# Patient Record
Sex: Female | Born: 1940
Health system: Southern US, Community
[De-identification: ages and names within clinical notes are randomized; demographics above are authoritative.]

## PROBLEM LIST (undated history)

## (undated) DIAGNOSIS — I10 Essential (primary) hypertension: Secondary | ICD-10-CM

## (undated) DIAGNOSIS — J449 Chronic obstructive pulmonary disease, unspecified: Secondary | ICD-10-CM

## (undated) DIAGNOSIS — M329 Systemic lupus erythematosus, unspecified: Secondary | ICD-10-CM

## (undated) DIAGNOSIS — F32A Depression, unspecified: Secondary | ICD-10-CM

## (undated) DIAGNOSIS — M359 Systemic involvement of connective tissue, unspecified: Secondary | ICD-10-CM

## (undated) DIAGNOSIS — IMO0002 Reserved for concepts with insufficient information to code with codable children: Secondary | ICD-10-CM

## (undated) DIAGNOSIS — C801 Malignant (primary) neoplasm, unspecified: Secondary | ICD-10-CM

## (undated) DIAGNOSIS — K219 Gastro-esophageal reflux disease without esophagitis: Secondary | ICD-10-CM

## (undated) DIAGNOSIS — M5136 Other intervertebral disc degeneration, lumbar region: Secondary | ICD-10-CM

## (undated) DIAGNOSIS — K922 Gastrointestinal hemorrhage, unspecified: Secondary | ICD-10-CM

## (undated) DIAGNOSIS — M51369 Other intervertebral disc degeneration, lumbar region without mention of lumbar back pain or lower extremity pain: Secondary | ICD-10-CM

## (undated) DIAGNOSIS — M1990 Unspecified osteoarthritis, unspecified site: Secondary | ICD-10-CM

## (undated) DIAGNOSIS — R51 Headache: Secondary | ICD-10-CM

## (undated) DIAGNOSIS — K5190 Ulcerative colitis, unspecified, without complications: Secondary | ICD-10-CM

## (undated) DIAGNOSIS — F329 Major depressive disorder, single episode, unspecified: Secondary | ICD-10-CM

## (undated) DIAGNOSIS — K5792 Diverticulitis of intestine, part unspecified, without perforation or abscess without bleeding: Secondary | ICD-10-CM

## (undated) DIAGNOSIS — L0390 Cellulitis, unspecified: Secondary | ICD-10-CM

## (undated) HISTORY — PX: ABDOMINAL HYSTERECTOMY: SHX81

## (undated) HISTORY — PX: COLONOSCOPY: SHX174

## (undated) HISTORY — PX: BREAST BIOPSY: SHX20

---

## 2004-02-28 ENCOUNTER — Ambulatory Visit: Payer: Self-pay | Admitting: Internal Medicine

## 2004-09-15 ENCOUNTER — Ambulatory Visit (HOSPITAL_COMMUNITY): Admission: RE | Admit: 2004-09-15 | Discharge: 2004-09-15 | Payer: Self-pay | Admitting: Neurosurgery

## 2005-07-28 ENCOUNTER — Ambulatory Visit: Payer: Self-pay | Admitting: Internal Medicine

## 2005-10-08 ENCOUNTER — Ambulatory Visit: Payer: Self-pay | Admitting: Unknown Physician Specialty

## 2005-12-31 ENCOUNTER — Ambulatory Visit: Payer: Self-pay | Admitting: Unknown Physician Specialty

## 2006-10-18 ENCOUNTER — Ambulatory Visit: Payer: Self-pay | Admitting: Internal Medicine

## 2007-10-26 ENCOUNTER — Ambulatory Visit: Payer: Self-pay | Admitting: Internal Medicine

## 2008-10-23 ENCOUNTER — Ambulatory Visit: Payer: Self-pay | Admitting: Internal Medicine

## 2008-10-24 ENCOUNTER — Ambulatory Visit: Payer: Self-pay | Admitting: Internal Medicine

## 2008-10-24 ENCOUNTER — Ambulatory Visit: Payer: Self-pay

## 2009-01-14 ENCOUNTER — Ambulatory Visit: Payer: Self-pay | Admitting: Internal Medicine

## 2010-01-15 ENCOUNTER — Ambulatory Visit: Payer: Self-pay | Admitting: Internal Medicine

## 2010-02-11 ENCOUNTER — Ambulatory Visit: Payer: Self-pay | Admitting: Unknown Physician Specialty

## 2010-02-12 LAB — PATHOLOGY REPORT

## 2010-03-26 ENCOUNTER — Encounter
Admission: RE | Admit: 2010-03-26 | Discharge: 2010-03-26 | Payer: Self-pay | Source: Home / Self Care | Attending: Unknown Physician Specialty | Admitting: Unknown Physician Specialty

## 2011-01-05 ENCOUNTER — Inpatient Hospital Stay: Payer: Self-pay | Admitting: Internal Medicine

## 2011-03-29 ENCOUNTER — Ambulatory Visit: Payer: Self-pay | Admitting: Unknown Physician Specialty

## 2011-04-01 LAB — PATHOLOGY REPORT

## 2011-04-06 ENCOUNTER — Ambulatory Visit: Payer: Self-pay | Admitting: Internal Medicine

## 2011-06-15 ENCOUNTER — Ambulatory Visit: Payer: Self-pay | Admitting: Physical Medicine and Rehabilitation

## 2011-08-26 ENCOUNTER — Ambulatory Visit: Payer: Self-pay | Admitting: Family Medicine

## 2011-09-15 ENCOUNTER — Encounter (HOSPITAL_COMMUNITY): Payer: Self-pay | Admitting: Respiratory Therapy

## 2011-09-15 NOTE — H&P (Signed)
CC: left shoulder pain HPI: 71 y/o female sustained a ground level fall almost 1 month ago injuring left shoulder and proximal humerus. Pt c/o constant pain and has elected for surgical ORIF to decrease pain and increase function PMH: anemia, COPD, depression, diabetes, diverticulitis, GERD, hypertension, migraines, ulcerative colitis Allergies: NKDA Meds: norco, gabapentin, premarin, metformin, ultram, asacol, protonix ROS: pain with rom left shoulder otherwise negative Social: non smoker, non drinker, widowed and lives alone Family: copd, depression, diabetes PE: alert and appropriate 71 y/o female in no acute distress Cervical spine shows full rom  Cranial nerves 2-12 intact Left shoulder: moderate pain and decrease rom of left shoulder nv intact distally Strength moderately decreased X-rays: proximal humerus fracture with mild displacement Assessment: left proximal humerus fracture Plan: ORIF left humerus to decrease pain and increase function

## 2011-09-16 ENCOUNTER — Encounter (HOSPITAL_COMMUNITY): Payer: Self-pay

## 2011-09-16 MED ORDER — CHLORHEXIDINE GLUCONATE 4 % EX LIQD: 60.0000 mL | Freq: Once | CUTANEOUS | Status: DC

## 2011-09-16 MED ORDER — CEFAZOLIN SODIUM-DEXTROSE 2-3 GM-% IV SOLR
2.0000 g | INTRAVENOUS | Status: AC
  Administered 2011-09-17: 2 g via INTRAVENOUS
  Filled 2011-09-16: qty 50

## 2011-09-16 MED ORDER — SODIUM CHLORIDE 0.9 % IV SOLN: INTRAVENOUS | Status: DC

## 2011-09-17 ENCOUNTER — Encounter (HOSPITAL_COMMUNITY): Payer: Self-pay | Admitting: Anesthesiology

## 2011-09-17 ENCOUNTER — Encounter (HOSPITAL_COMMUNITY): Payer: Self-pay | Admitting: *Deleted

## 2011-09-17 ENCOUNTER — Ambulatory Visit (HOSPITAL_COMMUNITY): Payer: Worker's Compensation

## 2011-09-17 ENCOUNTER — Inpatient Hospital Stay (HOSPITAL_COMMUNITY): Payer: Worker's Compensation

## 2011-09-17 ENCOUNTER — Ambulatory Visit (HOSPITAL_COMMUNITY): Payer: Worker's Compensation | Admitting: Anesthesiology

## 2011-09-17 ENCOUNTER — Encounter (HOSPITAL_COMMUNITY)
Admission: RE | Disposition: A | Discharge status: Home / Self Care | Payer: Self-pay | Source: Ambulatory Visit | Attending: Orthopedic Surgery

## 2011-09-17 ENCOUNTER — Inpatient Hospital Stay (HOSPITAL_COMMUNITY)
Admission: RE | Admit: 2011-09-17 | Discharge: 2011-09-19 | DRG: 494 | Disposition: A | Discharge status: Home / Self Care | Payer: Worker's Compensation | Source: Ambulatory Visit | Attending: Orthopedic Surgery | Admitting: Orthopedic Surgery

## 2011-09-17 DIAGNOSIS — W19XXXA Unspecified fall, initial encounter: Secondary | ICD-10-CM | POA: Diagnosis present

## 2011-09-17 DIAGNOSIS — S42213A Unspecified displaced fracture of surgical neck of unspecified humerus, initial encounter for closed fracture: Principal | ICD-10-CM | POA: Diagnosis present

## 2011-09-17 DIAGNOSIS — Z836 Family history of other diseases of the respiratory system: Secondary | ICD-10-CM

## 2011-09-17 DIAGNOSIS — Z818 Family history of other mental and behavioral disorders: Secondary | ICD-10-CM

## 2011-09-17 DIAGNOSIS — F329 Major depressive disorder, single episode, unspecified: Secondary | ICD-10-CM | POA: Diagnosis present

## 2011-09-17 DIAGNOSIS — J4489 Other specified chronic obstructive pulmonary disease: Secondary | ICD-10-CM | POA: Diagnosis present

## 2011-09-17 DIAGNOSIS — J449 Chronic obstructive pulmonary disease, unspecified: Secondary | ICD-10-CM | POA: Diagnosis present

## 2011-09-17 DIAGNOSIS — E119 Type 2 diabetes mellitus without complications: Secondary | ICD-10-CM | POA: Diagnosis present

## 2011-09-17 DIAGNOSIS — K219 Gastro-esophageal reflux disease without esophagitis: Secondary | ICD-10-CM | POA: Diagnosis present

## 2011-09-17 DIAGNOSIS — I10 Essential (primary) hypertension: Secondary | ICD-10-CM | POA: Diagnosis present

## 2011-09-17 DIAGNOSIS — D649 Anemia, unspecified: Secondary | ICD-10-CM | POA: Diagnosis present

## 2011-09-17 DIAGNOSIS — S42202A Unspecified fracture of upper end of left humerus, initial encounter for closed fracture: Secondary | ICD-10-CM | POA: Diagnosis present

## 2011-09-17 DIAGNOSIS — F3289 Other specified depressive episodes: Secondary | ICD-10-CM | POA: Diagnosis present

## 2011-09-17 DIAGNOSIS — Z833 Family history of diabetes mellitus: Secondary | ICD-10-CM

## 2011-09-17 HISTORY — PX: ORIF HUMERUS FRACTURE: SHX2126

## 2011-09-17 HISTORY — DX: Major depressive disorder, single episode, unspecified: F32.9

## 2011-09-17 HISTORY — DX: Essential (primary) hypertension: I10

## 2011-09-17 HISTORY — DX: Ulcerative colitis, unspecified, without complications: K51.90

## 2011-09-17 HISTORY — DX: Depression, unspecified: F32.A

## 2011-09-17 HISTORY — DX: Headache: R51

## 2011-09-17 HISTORY — DX: Gastro-esophageal reflux disease without esophagitis: K21.9

## 2011-09-17 HISTORY — DX: Gastrointestinal hemorrhage, unspecified: K92.2

## 2011-09-17 HISTORY — DX: Diverticulitis of intestine, part unspecified, without perforation or abscess without bleeding: K57.92

## 2011-09-17 HISTORY — DX: Unspecified osteoarthritis, unspecified site: M19.90

## 2011-09-17 HISTORY — DX: Chronic obstructive pulmonary disease, unspecified: J44.9

## 2011-09-17 LAB — GLUCOSE, CAPILLARY: Glucose-Capillary: 136 mg/dL — ABNORMAL HIGH (ref 70–99)

## 2011-09-17 LAB — BASIC METABOLIC PANEL
BUN: 16 mg/dL (ref 6–23)
Calcium: 9.4 mg/dL (ref 8.4–10.5)
Chloride: 96 mEq/L (ref 96–112)
Creatinine, Ser: 0.63 mg/dL (ref 0.50–1.10)
GFR calc Af Amer: >90 mL/min (ref >90)

## 2011-09-17 LAB — CBC
HCT: 35.6 % — ABNORMAL LOW (ref 36.0–46.0)
MCHC: 32.6 g/dL (ref 30.0–36.0)
MCV: 92.7 fL (ref 78.0–100.0)
Platelets: 264 10*3/uL (ref 150–400)
RDW: 12.5 % (ref 11.5–15.5)

## 2011-09-17 LAB — SURGICAL PCR SCREEN: Staphylococcus aureus: NEGATIVE

## 2011-09-17 SURGERY — OPEN REDUCTION INTERNAL FIXATION (ORIF) PROXIMAL HUMERUS FRACTURE
Anesthesia: Regional | Site: Shoulder | Laterality: Left | Wound class: Clean

## 2011-09-17 MED ORDER — VENLAFAXINE HCL ER 75 MG PO CP24
75.0000 mg | ORAL_CAPSULE | Freq: Every day | ORAL | Status: DC
  Administered 2011-09-18 – 2011-09-19 (×2): 75 mg via ORAL
  Filled 2011-09-17 (×2): qty 1

## 2011-09-17 MED ORDER — BUPIVACAINE-EPINEPHRINE 0.25-1:200000 % IJ SOLN
INTRAMUSCULAR | Status: DC | PRN
  Administered 2011-09-17: 7 mL

## 2011-09-17 MED ORDER — MENTHOL 3 MG MT LOZG: 1.0000 | LOZENGE | OROMUCOSAL | Status: DC | PRN

## 2011-09-17 MED ORDER — LIDOCAINE HCL (CARDIAC) 20 MG/ML IV SOLN
INTRAVENOUS | Status: DC | PRN
  Administered 2011-09-17: 60 mg via INTRAVENOUS

## 2011-09-17 MED ORDER — HYDROMORPHONE HCL PF 1 MG/ML IJ SOLN
0.5000 mg | INTRAMUSCULAR | Status: DC | PRN
  Administered 2011-09-18 (×5): 1 mg via INTRAVENOUS
  Filled 2011-09-17 (×5): qty 1

## 2011-09-17 MED ORDER — IRBESARTAN 300 MG PO TABS
300.0000 mg | ORAL_TABLET | Freq: Every day | ORAL | Status: DC
  Administered 2011-09-17 – 2011-09-19 (×3): 300 mg via ORAL
  Filled 2011-09-17 (×3): qty 1

## 2011-09-17 MED ORDER — PANTOPRAZOLE SODIUM 40 MG PO TBEC
40.0000 mg | DELAYED_RELEASE_TABLET | Freq: Every day | ORAL | Status: DC
  Administered 2011-09-18 – 2011-09-19 (×2): 40 mg via ORAL
  Filled 2011-09-17 (×3): qty 1

## 2011-09-17 MED ORDER — FENTANYL CITRATE 0.05 MG/ML IJ SOLN
50.0000 ug | Freq: Once | INTRAMUSCULAR | Status: AC
  Administered 2011-09-17: 50 ug via INTRAVENOUS

## 2011-09-17 MED ORDER — LACTATED RINGERS IV SOLN
INTRAVENOUS | Status: DC | PRN
  Administered 2011-09-17 (×2): via INTRAVENOUS

## 2011-09-17 MED ORDER — HYDROMORPHONE HCL PF 1 MG/ML IJ SOLN: 0.2500 mg | INTRAMUSCULAR | Status: DC | PRN

## 2011-09-17 MED ORDER — MESALAMINE 400 MG PO TBEC
400.0000 mg | DELAYED_RELEASE_TABLET | Freq: Two times a day (BID) | ORAL | Status: DC
  Administered 2011-09-17 – 2011-09-19 (×4): 400 mg via ORAL
  Filled 2011-09-17 (×5): qty 1

## 2011-09-17 MED ORDER — ACETAMINOPHEN 650 MG RE SUPP: 650.0000 mg | Freq: Four times a day (QID) | RECTAL | Status: DC | PRN

## 2011-09-17 MED ORDER — PHENOL 1.4 % MT LIQD: 1.0000 | OROMUCOSAL | Status: DC | PRN

## 2011-09-17 MED ORDER — ROCURONIUM BROMIDE 100 MG/10ML IV SOLN
INTRAVENOUS | Status: DC | PRN
  Administered 2011-09-17: 25 mg via INTRAVENOUS

## 2011-09-17 MED ORDER — METOCLOPRAMIDE HCL 5 MG/ML IJ SOLN: 5.0000 mg | Freq: Three times a day (TID) | INTRAMUSCULAR | Status: DC | PRN

## 2011-09-17 MED ORDER — METOCLOPRAMIDE HCL 5 MG PO TABS
5.0000 mg | ORAL_TABLET | Freq: Three times a day (TID) | ORAL | Status: DC | PRN
  Filled 2011-09-17: qty 2

## 2011-09-17 MED ORDER — FENTANYL CITRATE 0.05 MG/ML IJ SOLN
INTRAMUSCULAR | Status: AC
  Filled 2011-09-17: qty 2

## 2011-09-17 MED ORDER — ACETAMINOPHEN 325 MG PO TABS
650.0000 mg | ORAL_TABLET | Freq: Four times a day (QID) | ORAL | Status: DC | PRN
  Filled 2011-09-17: qty 2

## 2011-09-17 MED ORDER — DULOXETINE HCL 60 MG PO CPEP
60.0000 mg | ORAL_CAPSULE | Freq: Every evening | ORAL | Status: DC
  Administered 2011-09-17 – 2011-09-18 (×2): 60 mg via ORAL
  Filled 2011-09-17 (×3): qty 1

## 2011-09-17 MED ORDER — AMLODIPINE BESYLATE 5 MG PO TABS
5.0000 mg | ORAL_TABLET | Freq: Every day | ORAL | Status: DC
  Administered 2011-09-17 – 2011-09-19 (×3): 5 mg via ORAL
  Filled 2011-09-17 (×3): qty 1

## 2011-09-17 MED ORDER — INSULIN ASPART 100 UNIT/ML ~~LOC~~ SOLN: 0.0000 [IU] | Freq: Every day | SUBCUTANEOUS | Status: DC

## 2011-09-17 MED ORDER — METFORMIN HCL 500 MG PO TABS
500.0000 mg | ORAL_TABLET | Freq: Two times a day (BID) | ORAL | Status: DC
  Administered 2011-09-17 – 2011-09-19 (×4): 500 mg via ORAL
  Filled 2011-09-17 (×6): qty 1

## 2011-09-17 MED ORDER — ONDANSETRON HCL 4 MG PO TABS
4.0000 mg | ORAL_TABLET | Freq: Four times a day (QID) | ORAL | Status: DC | PRN
  Administered 2011-09-19: 4 mg via ORAL
  Filled 2011-09-17: qty 1

## 2011-09-17 MED ORDER — OLMESARTAN-AMLODIPINE-HCTZ 40-5-25 MG PO TABS: 1.0000 | ORAL_TABLET | Freq: Every day | ORAL | Status: DC

## 2011-09-17 MED ORDER — CEFAZOLIN SODIUM-DEXTROSE 2-3 GM-% IV SOLR
2.0000 g | Freq: Four times a day (QID) | INTRAVENOUS | Status: AC
  Administered 2011-09-17 – 2011-09-18 (×3): 2 g via INTRAVENOUS
  Filled 2011-09-17 (×4): qty 50

## 2011-09-17 MED ORDER — INSULIN ASPART 100 UNIT/ML ~~LOC~~ SOLN: 0.0000 [IU] | Freq: Three times a day (TID) | SUBCUTANEOUS | Status: DC

## 2011-09-17 MED ORDER — GABAPENTIN 100 MG PO CAPS
100.0000 mg | ORAL_CAPSULE | Freq: Two times a day (BID) | ORAL | Status: DC
  Administered 2011-09-17 – 2011-09-19 (×4): 100 mg via ORAL
  Filled 2011-09-17 (×5): qty 1

## 2011-09-17 MED ORDER — FENTANYL CITRATE 0.05 MG/ML IJ SOLN
INTRAMUSCULAR | Status: DC | PRN
  Administered 2011-09-17: 100 ug via INTRAVENOUS

## 2011-09-17 MED ORDER — KETOROLAC TROMETHAMINE 30 MG/ML IJ SOLN
INTRAMUSCULAR | Status: DC | PRN
  Administered 2011-09-17: 30 mg via INTRAVENOUS

## 2011-09-17 MED ORDER — ONDANSETRON HCL 4 MG/2ML IJ SOLN: 4.0000 mg | Freq: Once | INTRAMUSCULAR | Status: DC | PRN

## 2011-09-17 MED ORDER — METHOCARBAMOL 500 MG PO TABS: 500.0000 mg | ORAL_TABLET | Freq: Three times a day (TID) | ORAL | Status: AC | PRN

## 2011-09-17 MED ORDER — METHOCARBAMOL 100 MG/ML IJ SOLN
500.0000 mg | Freq: Four times a day (QID) | INTRAVENOUS | Status: DC | PRN
  Filled 2011-09-17: qty 5

## 2011-09-17 MED ORDER — SODIUM CHLORIDE 0.9 % IR SOLN
Status: DC | PRN
  Administered 2011-09-17 (×2): 1000 mL

## 2011-09-17 MED ORDER — MUPIROCIN 2 % EX OINT
TOPICAL_OINTMENT | Freq: Once | CUTANEOUS | Status: AC
  Administered 2011-09-17: 1 via NASAL

## 2011-09-17 MED ORDER — MUPIROCIN 2 % EX OINT
TOPICAL_OINTMENT | CUTANEOUS | Status: AC
  Administered 2011-09-17: 1 via NASAL
  Filled 2011-09-17: qty 22

## 2011-09-17 MED ORDER — SODIUM CHLORIDE 0.9 % IV SOLN
INTRAVENOUS | Status: DC
  Administered 2011-09-17: 14:00:00 via INTRAVENOUS

## 2011-09-17 MED ORDER — TRAMADOL HCL 50 MG PO TABS
50.0000 mg | ORAL_TABLET | Freq: Every day | ORAL | Status: DC
  Administered 2011-09-19: 50 mg via ORAL
  Filled 2011-09-17 (×2): qty 1

## 2011-09-17 MED ORDER — OXYCODONE-ACETAMINOPHEN 5-325 MG PO TABS
1.0000 | ORAL_TABLET | ORAL | Status: DC | PRN
  Administered 2011-09-17: 2 via ORAL
  Administered 2011-09-17: 1 via ORAL
  Administered 2011-09-18 – 2011-09-19 (×8): 2 via ORAL
  Filled 2011-09-17 (×5): qty 2
  Filled 2011-09-17: qty 1
  Filled 2011-09-17 (×4): qty 2

## 2011-09-17 MED ORDER — PROPOFOL 10 MG/ML IV EMUL
INTRAVENOUS | Status: DC | PRN
  Administered 2011-09-17: 200 mg via INTRAVENOUS

## 2011-09-17 MED ORDER — INSULIN ASPART 100 UNIT/ML ~~LOC~~ SOLN: 4.0000 [IU] | Freq: Three times a day (TID) | SUBCUTANEOUS | Status: DC

## 2011-09-17 MED ORDER — PHENYLEPHRINE HCL 10 MG/ML IJ SOLN
INTRAMUSCULAR | Status: DC | PRN
  Administered 2011-09-17 (×8): 80 ug via INTRAVENOUS

## 2011-09-17 MED ORDER — ESTROGENS CONJUGATED 0.625 MG PO TABS
0.6250 mg | ORAL_TABLET | Freq: Every day | ORAL | Status: DC
  Administered 2011-09-18 – 2011-09-19 (×2): 0.625 mg via ORAL
  Filled 2011-09-17 (×2): qty 1

## 2011-09-17 MED ORDER — HYDROCHLOROTHIAZIDE 25 MG PO TABS
25.0000 mg | ORAL_TABLET | Freq: Every day | ORAL | Status: DC
  Administered 2011-09-17 – 2011-09-19 (×3): 25 mg via ORAL
  Filled 2011-09-17 (×3): qty 1

## 2011-09-17 MED ORDER — ONDANSETRON HCL 4 MG/2ML IJ SOLN: 4.0000 mg | Freq: Four times a day (QID) | INTRAMUSCULAR | Status: DC | PRN

## 2011-09-17 MED ORDER — OXYCODONE-ACETAMINOPHEN 5-325 MG PO TABS: 1.0000 | ORAL_TABLET | ORAL | Status: AC | PRN

## 2011-09-17 MED ORDER — METHOCARBAMOL 500 MG PO TABS
500.0000 mg | ORAL_TABLET | Freq: Four times a day (QID) | ORAL | Status: DC | PRN
  Administered 2011-09-18 – 2011-09-19 (×4): 500 mg via ORAL
  Filled 2011-09-17 (×4): qty 1

## 2011-09-17 MED FILL — Bupivacaine Inj 0.25% w/ Epinephrine 1:200000 (PF): INTRAMUSCULAR | Qty: 30 | Status: AC

## 2011-09-17 SURGICAL SUPPLY — 69 items
BIT DRILL 2.8X4 QC CORT (BIT) ×2 IMPLANT
BIT DRILL 4 LONG FAST STEP (BIT) ×2 IMPLANT
BIT DRILL 4 SHORT FAST STEP (BIT) ×2 IMPLANT
CLOTH BEACON ORANGE TIMEOUT ST (SAFETY) ×2 IMPLANT
CLSR STERI-STRIP ANTIMIC 1/2X4 (GAUZE/BANDAGES/DRESSINGS) ×2 IMPLANT
DRAPE INCISE IOBAN 66X45 STRL (DRAPES) ×4 IMPLANT
DRAPE U-SHAPE 47X51 STRL (DRAPES) ×2 IMPLANT
DRSG EMULSION OIL 3X3 NADH (GAUZE/BANDAGES/DRESSINGS) ×2 IMPLANT
DRSG PAD ABDOMINAL 8X10 ST (GAUZE/BANDAGES/DRESSINGS) ×2 IMPLANT
DURAPREP 26ML APPLICATOR (WOUND CARE) ×2 IMPLANT
ELECT BLADE 4.0 EZ CLEAN MEGAD (MISCELLANEOUS) ×2
ELECT NEEDLE TIP 2.8 STRL (NEEDLE) ×2 IMPLANT
ELECT REM PT RETURN 9FT ADLT (ELECTROSURGICAL) ×2
ELECTRODE BLDE 4.0 EZ CLN MEGD (MISCELLANEOUS) ×1 IMPLANT
ELECTRODE REM PT RTRN 9FT ADLT (ELECTROSURGICAL) ×1 IMPLANT
GLOVE BIOGEL PI IND STRL 7.0 (GLOVE) ×1 IMPLANT
GLOVE BIOGEL PI IND STRL 8 (GLOVE) ×1 IMPLANT
GLOVE BIOGEL PI INDICATOR 7.0 (GLOVE) ×1
GLOVE BIOGEL PI INDICATOR 8 (GLOVE) ×1
GLOVE BIOGEL PI ORTHO PRO 7.5 (GLOVE) ×1
GLOVE BIOGEL PI ORTHO PRO SZ8 (GLOVE) ×1
GLOVE ORTHO TXT STRL SZ7.5 (GLOVE) ×2 IMPLANT
GLOVE PI ORTHO PRO STRL 7.5 (GLOVE) ×1 IMPLANT
GLOVE PI ORTHO PRO STRL SZ8 (GLOVE) ×1 IMPLANT
GLOVE SURG ORTHO 8.5 STRL (GLOVE) ×2 IMPLANT
GLOVE SURG SS PI 7.5 STRL IVOR (GLOVE) ×2 IMPLANT
GOWN STRL NON-REIN LRG LVL3 (GOWN DISPOSABLE) ×4 IMPLANT
GOWN STRL REIN XL XLG (GOWN DISPOSABLE) ×4 IMPLANT
KIT BASIN OR (CUSTOM PROCEDURE TRAY) ×2 IMPLANT
KIT ROOM TURNOVER OR (KITS) ×2 IMPLANT
MANIFOLD NEPTUNE II (INSTRUMENTS) ×2 IMPLANT
MAYO 1/2 TAPER NEEDLES ×2 IMPLANT
NDL SUT 6 .5 CRC .975X.05 MAYO (NEEDLE) ×1 IMPLANT
NEEDLE 22X1 1/2 (OR ONLY) (NEEDLE) ×2 IMPLANT
NEEDLE MAYO TAPER (NEEDLE) ×1
NS IRRIG 1000ML POUR BTL (IV SOLUTION) ×4 IMPLANT
PACK SHOULDER (CUSTOM PROCEDURE TRAY) ×2 IMPLANT
PAD ARMBOARD 7.5X6 YLW CONV (MISCELLANEOUS) ×4 IMPLANT
PASSER SUT SWANSON 36MM LOOP (INSTRUMENTS) IMPLANT
PEG STND 4.0X37.5MM (Orthopedic Implant) ×8 IMPLANT
PEG STND 4.0X40MM (Orthopedic Implant) ×4 IMPLANT
PEG STND 4.0X50.0MM (Orthopedic Implant) ×2 IMPLANT
PEGSTD 4.0X37.5MM (Orthopedic Implant) ×4 IMPLANT
PEGSTD 4.0X40MM (Orthopedic Implant) ×2 IMPLANT
PEGSTD 4.0X50.0MM (Orthopedic Implant) ×1 IMPLANT
PIN GUIDE SHOULDER 2.0MM (PIN) ×6 IMPLANT
PLATE SHOULDER S3 3HOLE LT (Plate) ×2 IMPLANT
SCREW MULTIDIR 3.8X24 HUMRL (Screw) ×2 IMPLANT
SCREW MULTIDIR 3.8X26 HUMRL (Screw) ×4 IMPLANT
SCREW MULTIDIR SS 3.8X28 HUMRL (Screw) ×2 IMPLANT
SLING ARM FOAM STRAP LRG (SOFTGOODS) ×2 IMPLANT
SPONGE GAUZE 4X4 12PLY (GAUZE/BANDAGES/DRESSINGS) ×2 IMPLANT
SPONGE LAP 4X18 X RAY DECT (DISPOSABLE) ×4 IMPLANT
STAPLER VISISTAT 35W (STAPLE) ×2 IMPLANT
STRIP CLOSURE SKIN 1/2X4 (GAUZE/BANDAGES/DRESSINGS) ×2 IMPLANT
SUCTION FRAZIER TIP 10 FR DISP (SUCTIONS) ×2 IMPLANT
SUT FIBERWIRE #2 38 T-5 BLUE (SUTURE) ×6
SUT MNCRL AB 4-0 PS2 18 (SUTURE) ×2 IMPLANT
SUT VIC AB 0 CT1 27 (SUTURE) ×1
SUT VIC AB 0 CT1 27XBRD ANBCTR (SUTURE) ×1 IMPLANT
SUT VIC AB 2-0 CT1 27 (SUTURE) ×5
SUT VIC AB 2-0 CT1 TAPERPNT 27 (SUTURE) ×5 IMPLANT
SUTURE FIBERWR #2 38 T-5 BLUE (SUTURE) ×3 IMPLANT
SYR CONTROL 10ML LL (SYRINGE) ×2 IMPLANT
TAPE CLOTH SURG 4X10 WHT LF (GAUZE/BANDAGES/DRESSINGS) ×2 IMPLANT
TOWEL OR 17X24 6PK STRL BLUE (TOWEL DISPOSABLE) ×2 IMPLANT
TOWEL OR 17X26 10 PK STRL BLUE (TOWEL DISPOSABLE) ×2 IMPLANT
WATER STERILE IRR 1000ML POUR (IV SOLUTION) IMPLANT
YANKAUER SUCT BULB TIP NO VENT (SUCTIONS) ×2 IMPLANT

## 2011-09-17 NOTE — Progress Notes (Signed)
UR COMPLETED  

## 2011-09-17 NOTE — Transfer of Care (Signed)
Immediate Anesthesia Transfer of Care Note  Patient: Regina Hood  Procedure(s) Performed: Procedure(s) (LRB): OPEN REDUCTION INTERNAL FIXATION (ORIF) PROXIMAL HUMERUS FRACTURE (Left)  Patient Location: PACU  Anesthesia Type: General  Level of Consciousness: awake, alert  and oriented  Airway & Oxygen Therapy: Patient Spontanous Breathing and Patient connected to nasal cannula oxygen  Post-op Assessment: Report given to PACU RN  Post vital signs: Reviewed and stable  Complications: No apparent anesthesia complications

## 2011-09-17 NOTE — Discharge Instructions (Signed)
Please do exercises every hour to prevent stiffness.  Ice constantly!!  Use sling sparingly, only when out of the home.  Can place old beat down pillow on the lap under the arm to rest the arm. Keep the bandage in place for three days then can change.  Do not get the wound wet for one week from surgery.   No forceful lifting pushing or pulling with the arm.  Follow up in the office in 2 weeks. [phone]

## 2011-09-17 NOTE — Discharge Summary (Signed)
Physician Discharge Summary  Patient ID: Regina Hood MRN: [id number] DOB/AGE: 1940-06-28 71 y.o.  Admit date: 09/17/2011 Discharge date: 09/19/2011  Admission Diagnoses:  Principal Problem:  *Proximal humerus fracture, left, closed, initial encounter   Discharge Diagnoses:  Same   Surgeries: Procedure(s): OPEN REDUCTION INTERNAL FIXATION (ORIF) PROXIMAL HUMERUS FRACTURE on 09/17/2011   Consultants: PT, OT, D/C planning  Discharged Condition: Stable  Hospital Course: Regina Hood is an 71 y.o. female who was admitted 09/17/2011 with a chief complaint of left shoulder pain and dysfunction, and found to have a diagnosis of Proximal humerus fracture, left, closed, initial encounter.  They were brought to the operating room on 09/17/2011 and underwent the above named procedures.    The patient had an uncomplicated hospital course and was stable for discharge.  Recent vital signs:  Filed Vitals:   09/17/11 2055  BP: 106/69  Pulse: 95  Temp: 98.7 F (37.1 C)  Resp: 18    Recent laboratory studies:  Results for orders placed during the hospital encounter of 09/17/11  GLUCOSE, CAPILLARY      Component Value Range   Glucose-Capillary 136 (*) 70 - 99 (mg/dL)  SURGICAL PCR SCREEN      Component Value Range   MRSA, PCR NEGATIVE  NEGATIVE    Staphylococcus aureus NEGATIVE  NEGATIVE   BASIC METABOLIC PANEL      Component Value Range   Sodium 135  135 - 145 (mEq/L)   Potassium 4.1  3.5 - 5.1 (mEq/L)   Chloride 96  96 - 112 (mEq/L)   CO2 26  19 - 32 (mEq/L)   Glucose, Bld 124 (*) 70 - 99 (mg/dL)   BUN 16  6 - 23 (mg/dL)   Creatinine, Ser 0.63  0.50 - 1.10 (mg/dL)   Calcium 9.4  8.4 - 10.5 (mg/dL)   GFR calc non Af Amer 89 (*) >90 (mL/min)   GFR calc Af Amer >90  >90 (mL/min)  CBC      Component Value Range   WBC 8.3  4.0 - 10.5 (K/uL)   RBC 3.84 (*) 3.87 - 5.11 (MIL/uL)   Hemoglobin 11.6 (*) 12.0 - 15.0 (g/dL)   HCT 35.6 (*) 36.0 - 46.0 (%)   MCV 92.7  78.0 - 100.0 (fL)   MCH  30.2  26.0 - 34.0 (pg)   MCHC 32.6  30.0 - 36.0 (g/dL)   RDW 12.5  11.5 - 15.5 (%)   Platelets 264  150 - 400 (K/uL)  GLUCOSE, CAPILLARY      Component Value Range   Glucose-Capillary 97  70 - 99 (mg/dL)  GLUCOSE, CAPILLARY      Component Value Range   Glucose-Capillary 117 (*) 70 - 99 (mg/dL)   Comment 1 Documented in Chart     Comment 2 Notify RN      Discharge Medications:   Medication List  As of 09/17/2011 10:44 PM   TAKE these medications         DULoxetine 60 MG capsule   Commonly known as: CYMBALTA   Take 60 mg by mouth every evening.      estrogens (conjugated) 0.625 MG tablet   Commonly known as: PREMARIN   Take 0.625 mg by mouth daily. Take daily for 21 days then do not take for 7 days.      gabapentin 100 MG capsule   Commonly known as: NEURONTIN   Take 100 mg by mouth 2 (two) times daily.      HYDROcodone-acetaminophen 5-325  MG per tablet   Commonly known as: NORCO   Take 1-2 tablets by mouth every 6 (six) hours as needed. For pain      mesalamine 400 MG EC tablet   Commonly known as: ASACOL   Take 400 mg by mouth 2 (two) times daily.      metFORMIN 500 MG tablet   Commonly known as: GLUCOPHAGE   Take 500 mg by mouth 2 (two) times daily with a meal.      methocarbamol 500 MG tablet   Commonly known as: ROBAXIN   Take 1 tablet (500 mg total) by mouth 3 (three) times daily as needed.      oxyCODONE-acetaminophen 5-325 MG per tablet   Commonly known as: PERCOCET   Take 1-2 tablets by mouth every 4 (four) hours as needed for pain.      pantoprazole 40 MG tablet   Commonly known as: PROTONIX   Take 40 mg by mouth daily.      traMADol 50 MG tablet   Commonly known as: ULTRAM   Take 50 mg by mouth daily.      TRIBENZOR 40-5-25 MG Tabs   Generic drug: Olmesartan-Amlodipine-HCTZ   Take 1 tablet by mouth daily.      venlafaxine XR 75 MG 24 hr capsule   Commonly known as: EFFEXOR-XR   Take 75 mg by mouth daily.            Diagnostic Studies: Dg  Chest 2 View  09/17/2011  *RADIOLOGY REPORT*  Clinical Data: Preop overlying the left humerus  CHEST - 2 VIEW  Comparison: None.  Findings: Chronic interstitial markings.   Bilateral lower lobe scarring/fibrosis.  Postsurgical changes in the right lower lung. No pleural effusion or pneumothorax.  Cardiomediastinal silhouette is within normal limits.  Mild degenerative changes of the visualized thoracolumbar spine.  IMPRESSION: No evidence of acute cardiopulmonary disease.  Chronic interstitial markings/fibrosis.  Original Report Authenticated By: Julian Hy, M.D.   Dg Shoulder Left  09/17/2011  *RADIOLOGY REPORT*  Clinical Data: Status post fracture fixation.  LEFT SHOULDER - 2+ VIEW  Comparison: None.  Findings: Plate and screws are seen fixing a surgical neck fracture of the left humerus.  Hardware appears intact.  Position and alignment are near anatomic on this single view.  IMPRESSION: ORIF left surgical neck fracture without evidence of complication.  Original Report Authenticated By: Arvid Right. Luther Parody, M.D.    Disposition: home    Follow-up Information    Call Augustin Schooling, MD. [phone])    Contact information:   [facility] [address], [place] [phone]           Signed: Augustin Schooling 09/17/2011, 10:44 PM

## 2011-09-17 NOTE — Interval H&P Note (Signed)
History and Physical Interval Note:  09/17/2011 9:18 AM  [name]  has presented today for surgery, with the diagnosis of left proximal humerus fracture   The various methods of treatment have been discussed with the patient and family. After consideration of risks, benefits and other options for treatment, the patient has consented to  Procedure(s) (LRB): OPEN REDUCTION INTERNAL FIXATION (ORIF) PROXIMAL HUMERUS FRACTURE (Left) as a surgical intervention .  The patients' history has been reviewed, patient examined, no change in status, stable for surgery.  I have reviewed the patients' chart and labs.  Questions were answered to the patient's satisfaction.     Francisco Eyerly,STEVEN R

## 2011-09-17 NOTE — Op Note (Signed)
Regina Hood, Regina Hood NO.:  [id number]  MEDICAL RECORD NO.:  [id number]  LOCATION:  [facility]                         FACILITY:  [facility]  PHYSICIAN:  Doran Heater. Veverly Fells, M.D. DATE OF BIRTH:  06/17/1940  DATE OF PROCEDURE:  09/17/2011 DATE OF DISCHARGE:                              OPERATIVE REPORT   PREOPERATIVE DIAGNOSIS:  Left displaced proximal humerus fracture.  POSTOPERATIVE DIAGNOSIS:  Left displaced proximal humerus fracture.  PROCEDURE PERFORMED:  Open reduction, internal fixation of left proximal humerus fracture.  ATTENDING SURGEON:  Doran Heater. Veverly Fells, M.D.  ASSISTANT:  Abbott Pao. Dixon, P.A. was scrubbed the entire procedure necessary for satisfactory completion of surgery.  ANESTHESIA:  General anesthesia plus interscalene block anesthesia was used.  ESTIMATED BLOOD LOSS:  Minimal.  FLUID REPLACEMENT:  1200 mL crystalloids.  COUNTS:  Correct.  COMPLICATIONS:  No complications.  DESCRIPTION OF PROCEDURE:  Perioperative antibiotics were given.  INDICATIONS:  The patient is a 71 year old female suffered a work related injury to her shoulder a month ago.  The patient fell injuring the shoulder.  The patient had a displaced proximal fracture was managed conservatively initially.  Due to persistent pain, and large greater than cm step-off in the joint surface.  The patient presented to me as a second opinion.  We discussed options for management including continued conservative management versus open reduction, internal fixation of her humeral head to get realigned with tuberosities.  The patient would like to proceed with surgery.  Informed consent obtained.  DESCRIPTION OF PROCEDURE:  After adequate level of anesthesia achieved, the patient positioned in the modified beachchair position.  Left shoulder correctly identified.  Time-out called.  Sterile prep and drape of the left shoulder and arm performed.  We entered the patient's shoulder using  deltopectoral incision starting coracoid process extending into the anterior humeral shaft.  Dissection down through subcu tissues using Bovie electrocautery.  Cephalic vein identified taken laterally with the deltoid.  Pectoralis taken medially.  The conjoined tendon was taken medially we released the subscapularis off the bicipital groove, identifying the fracture site.  We placed 2 FiberWire sutures in the subscapularis tendon ain modified Mason-Allen suture fashion.  We did a biceps tenotomy right at the labral surface, we irrigated the joint.  We identified the fracture line, placed Cobb elevator inside the fractured humerus, mobilizing the fractured humeral head and relocating it anatomically.  We placed a DePuy S3 plate on the lateral humerus at the appropriate position and then placed pins per to provisionally hold the head in place.  We are pleased with the location of the plate and the head reduction we then placed 5 locked smooth pegs up in the head and 3 bicortical screws in the humeral shaft.  We took the shoulder through a full range of motion.  We inspected and palpated the humeral head to make sure we did not have any long pins.  We ranged the shoulder fully, everything was stable and moved to the unit.  We then thoroughly irrigated the joint out and then repaired the subscap anatomically the bone as well as rotator interval closure.  We had an anatomic closure of the  subscap we could externally rotate 45 degrees before we get tension on the subscap internal rotation her abdomen and forward elevation to 90 without a problem and the motion was nice and smooth.  No clicking we thoroughly irrigated closing deltopectoral interval with 0 Vicryl suture followed by 2-0 Vicryl subcutaneous closure 4-0 Monocryl for skin.  Steri-Strips applied followed by sterile dressing.  Patient tolerated the procedure well.     Doran Heater. Veverly Fells, M.D.     SRN/MEDQ  D:  09/17/2011  T:   09/17/2011  Job:  [id number]

## 2011-09-17 NOTE — Brief Op Note (Signed)
09/17/2011  12:17 PM  PATIENT:  Regina Hood  71 y.o. female  PRE-OPERATIVE DIAGNOSIS:  left proximal humerus fracture   POST-OPERATIVE DIAGNOSIS:  Left Proximal humerus fracture  PROCEDURE:  Procedure(s) (LRB): OPEN REDUCTION INTERNAL FIXATION (ORIF) PROXIMAL HUMERUS FRACTURE (Left)  SURGEON:  Surgeon(s) and Role:    * Augustin Schooling, MD - Primary  PHYSICIAN ASSISTANT:   ASSISTANTS: Ventura Bruns, PA-C   ANESTHESIA:   regional and general  EBL:  Total I/O In: 1000 [I.V.:1000] Out: 100 [Blood:100]  BLOOD ADMINISTERED:none  DRAINS: none   LOCAL MEDICATIONS USED:  MARCAINE     SPECIMEN:  No Specimen  DISPOSITION OF SPECIMEN:  N/A  COUNTS:  YES  TOURNIQUET:  * No tourniquets in log *  DICTATION: .Other Dictation: Dictation Number [id number]  PLAN OF CARE: Admit to inpatient   PATIENT DISPOSITION:  PACU - hemodynamically stable.   Delay start of Pharmacological VTE agent (>24hrs) due to surgical blood loss or risk of bleeding: not applicable

## 2011-09-17 NOTE — Anesthesia Preprocedure Evaluation (Addendum)
Anesthesia Evaluation  Patient identified by MRN, date of birth, ID band Patient awake    Reviewed: Allergy & Precautions, H&P , NPO status , Patient's Chart, lab work & pertinent test results  History of Anesthesia Complications Negative for: history of anesthetic complications  Airway Mallampati: I TM Distance: >3 FB Neck ROM: full    Dental  (+) Teeth Intact and Dental Advisory Given   Pulmonary COPDformer smoker         Cardiovascular hypertension, Pt. on medications Rhythm:regular Rate:Normal     Neuro/Psych  Headaches, PSYCHIATRIC DISORDERS    GI/Hepatic PUD, GERD-  ,  Endo/Other  Diabetes mellitus-, Well Controlled, Type 2, Oral Hypoglycemic Agents  Renal/GU      Musculoskeletal   Abdominal   Peds  Hematology   Anesthesia Other Findings   Reproductive/Obstetrics                          Anesthesia Physical Anesthesia Plan  ASA: II  Anesthesia Plan: General and Regional   Post-op Pain Management:    Induction: Intravenous  Airway Management Planned: Oral ETT  Additional Equipment:   Intra-op Plan:   Post-operative Plan: Extubation in OR  Informed Consent: I have reviewed the patients History and Physical, chart, labs and discussed the procedure including the risks, benefits and alternatives for the proposed anesthesia with the patient or authorized representative who has indicated his/her understanding and acceptance.   Dental advisory given  Plan Discussed with: CRNA, Anesthesiologist and Surgeon  Anesthesia Plan Comments:        Anesthesia Quick Evaluation

## 2011-09-17 NOTE — Progress Notes (Signed)
Patient monitored during block maintained oxygen level 95- 98% pulse 110.

## 2011-09-17 NOTE — Plan of Care (Signed)
Problem: Diagnosis - Type of Surgery Goal: General Surgical Patient Education (See Patient Education module for education specifics) L humerus ORIF

## 2011-09-17 NOTE — Anesthesia Postprocedure Evaluation (Signed)
  Anesthesia Post-op Note  Patient: Regina Hood  Procedure(s) Performed: Procedure(s) (LRB): OPEN REDUCTION INTERNAL FIXATION (ORIF) PROXIMAL HUMERUS FRACTURE (Left)  Patient Location: PACU  Anesthesia Type: GA combined with regional for post-op pain  Level of Consciousness: awake, alert , oriented and patient cooperative  Airway and Oxygen Therapy: Patient Spontanous Breathing  Post-op Pain: mild  Post-op Assessment: Post-op Vital signs reviewed, Patient's Cardiovascular Status Stable, Respiratory Function Stable, Patent Airway, No signs of Nausea or vomiting and Pain level controlled  Post-op Vital Signs: stable  Complications: No apparent anesthesia complications

## 2011-09-17 NOTE — Anesthesia Procedure Notes (Addendum)
Anesthesia Regional Block:  Interscalene brachial plexus block  Pre-Anesthetic Checklist: ,, timeout performed, Correct Patient, Correct Site, Correct Laterality, Correct Procedure, Correct Position, site marked, Risks and benefits discussed,  Surgical consent,  Pre-op evaluation,  At surgeon's request and post-op pain management  Laterality: Left  Prep: Maximum Sterile Barrier Precautions used, chloraprep and alcohol swabs       Needles:   Needle Type: Stimulator Needle - 40          Additional Needles:  Procedures: nerve stimulator Interscalene brachial plexus block  Nerve Stimulator or Paresthesia:  Response: 0.5 mA, 0.1 ms, 4 cm  Additional Responses:   Narrative:  Start time: 09/17/2011 9:25 AM End time: 09/17/2011 9:36 AM Injection made incrementally with aspirations every 5 mL.  Performed by: Personally  Anesthesiologist: Sharolyn Douglas   Additional Notes: 18cc 0.5% Marcaine w/epi w/ mild difficulty w/o discomfort.  GES   Procedure Name: Intubation Date/Time: 09/17/2011 9:50 AM Performed by: Babs Bertin Pre-anesthesia Checklist: Patient identified, Emergency Drugs available, Suction available, Patient being monitored and Timeout performed Patient Re-evaluated:Patient Re-evaluated prior to inductionOxygen Delivery Method: Circle system utilized Preoxygenation: Pre-oxygenation with 100% oxygen Intubation Type: IV induction Ventilation: Mask ventilation without difficulty Laryngoscope Size: Mac and 3 Grade View: Grade I Tube type: Oral Tube size: 7.0 mm Number of attempts: 1 Airway Equipment and Method: Stylet Secured at: 21 cm Tube secured with: Tape Dental Injury: Teeth and Oropharynx as per pre-operative assessment

## 2011-09-18 LAB — GLUCOSE, CAPILLARY
Glucose-Capillary: 130 mg/dL — ABNORMAL HIGH (ref 70–99)
Glucose-Capillary: 120 mg/dL — ABNORMAL HIGH (ref 70–99)

## 2011-09-18 LAB — BASIC METABOLIC PANEL
Calcium: 9 mg/dL (ref 8.4–10.5)
GFR calc Af Amer: >90 mL/min (ref >90)
GFR calc non Af Amer: >90 mL/min (ref >90)
Sodium: 135 mEq/L (ref 135–145)

## 2011-09-18 LAB — HEMOGLOBIN AND HEMATOCRIT, BLOOD
HCT: 34 % — ABNORMAL LOW (ref 36.0–46.0)
Hemoglobin: 11.1 g/dL — ABNORMAL LOW (ref 12.0–15.0)

## 2011-09-18 NOTE — Progress Notes (Signed)
Orthopedics Progress Note  Subjective: It hurts a lot!  Objective:  Filed Vitals:   09/18/11 0519  BP: 130/82  Pulse: 101  Temp: 97.9 F (36.6 C)  Resp: 20    General: Awake and alert  Musculoskeletal: shoulder dressing CDI,  Moving the shoulder well though Neurovascularly intact  Lab Results  Component Value Date   WBC 8.3 09/17/2011   HGB 11.6* 09/17/2011   HCT 35.6* 09/17/2011   MCV 92.7 09/17/2011   PLT 264 09/17/2011       Component Value Date/Time   NA 135 09/17/2011 0809   K 4.1 09/17/2011 0809   CL 96 09/17/2011 0809   CO2 26 09/17/2011 0809   GLUCOSE 124* 09/17/2011 0809   BUN 16 09/17/2011 0809   CREATININE 0.63 09/17/2011 0809   CALCIUM 9.4 09/17/2011 0809   GFRNONAA 89* 09/17/2011 0809   GFRAA >90 09/17/2011 0809    No results found for this basename: INR, PROTIME    Assessment/Plan: POD #1 s/p Procedure(s): OPEN REDUCTION INTERNAL FIXATION (ORIF) PROXIMAL HUMERUS FRACTURE Patient needs intensive OT today with AAROM and pulley set up over her bed. Likely D/C tomorrow.  D/C summary shared.  Doran Heater. Veverly Fells, MD 09/18/2011 7:34 AM

## 2011-09-18 NOTE — Progress Notes (Signed)
Orthopedic Tech Progress Note Patient Details:  Regina Hood 29-Apr-1940 [id number]  Musculoskeletal Traction Type of Traction: Other (Comment) Traction Location: traction system for the shoulder made up of pully so PT can exercise her own shoulder while in bed    Cammer, Theodoro Parma 09/18/2011, 9:53 AM

## 2011-09-18 NOTE — Evaluation (Signed)
Occupational Therapy Evaluation Patient Details Name: Regina Hood MRN: [id number] DOB: [birth year] Today's Date: 09/18/2011 Time: 0812-0850 OT Time Calculation (min): 38 min  OT Assessment / Plan / Recommendation Clinical Impression  71 yo female s/p ORIF proximal humerus that could benefit from skilled OT acutely. Recommend follow up with MD and outpatient OT    OT Assessment  Patient needs continued OT Services    Follow Up Recommendations  Outpatient OT    Barriers to Discharge      Equipment Recommendations  None recommended by OT    Recommendations for Other Services    Frequency  Min 3X/week    Precautions / Restrictions Precautions Precautions: Shoulder Type of Shoulder Precautions: AAROM shoulder flexion, pull setup over bed,  Precaution Comments: shoulder handout Restrictions RUE Weight Bearing: Non weight bearing   Pertinent Vitals/Pain Shooting pain with exercise. Pt able to PROM with Rt UE assisting Lt UE to ~30 degrees.     ADL  Grooming: Performed;Wash/dry face;Teeth care;Set up Where Assessed - Grooming: Unsupported sitting Transfers/Ambulation Related to ADLs: no ambulation due to nauseated ADL Comments: Pt educated on don / doff sling however pt reports MD requesting sling not be worn at this time. Pt educated on AAROM of LT UE and pulley system setup. Pt completed 5 reps of each exercises due to pain  and nausea this AM. Pt provided shoulder handout for ADLS and able to verbalize dressing, bathing and bed positioning.    OT Diagnosis: Acute pain  OT Problem List: Decreased strength;Decreased range of motion;Decreased activity tolerance;Impaired balance (sitting and/or standing);Pain;Impaired UE functional use OT Treatment Interventions: Self-care/ADL training;Therapeutic exercise;Therapeutic activities;Patient/family education;Balance training   OT Goals Acute Rehab OT Goals OT Goal Formulation: With patient Time For Goal Achievement: 09/25/11 Potential  to Achieve Goals: Good ADL Goals Pt Will Perform Upper Body Dressing: with set-up;Sit to stand from chair;Sit to stand from bed ADL Goal: Upper Body Dressing - Progress: Goal set today Miscellaneous OT Goals Miscellaneous OT Goal #1: Pt will perform bed mobility Mod I as precursor to adls OT Goal: Miscellaneous Goal #1 - Progress: Goal set today Miscellaneous OT Goal #2: PT will complete HEP MOD I as precursor to ADLS OT Goal: Miscellaneous Goal #2 - Progress: Goal set today  Visit Information  Last OT Received On: 09/18/11 Assistance Needed: +1    Subjective Data  Subjective: "to go home and get better" Patient Stated Goal: to get back to work   Prior Functioning  Communication Communication: No difficulties Dominant Hand: Right    Cognition  Overall Cognitive Status: Appears within functional limits for tasks assessed/performed Arousal/Alertness: Awake/alert Orientation Level: Appears intact for tasks assessed Behavior During Session: [facility] for tasks performed    Extremity/Trunk Assessment Right Upper Extremity Assessment RUE ROM/Strength/Tone: Within functional levels RUE Coordination: WFL - gross/fine motor Left Upper Extremity Assessment LUE ROM/Strength/Tone: Due to pain;Due to precautions   Mobility Bed Mobility Bed Mobility: Supine to Sit Supine to Sit: 4: Min assist;HOB elevated   Exercise    Balance    End of Session OT - End of Session Activity Tolerance: Patient limited by pain Patient left: in bed;with call bell/phone within reach Nurse Communication: Precautions   Veneda Melter 09/18/2011, 9:42 AM Pager: [phone]

## 2011-09-18 NOTE — Plan of Care (Signed)
Problem: Phase I Progression Outcomes Goal: OOB as tolerated unless otherwise ordered Outcome: Progressing Limited by nausea

## 2011-09-18 NOTE — Progress Notes (Signed)
Received order for PT evaluation.  Patient reports she is ambulating fine and does not need PT.  Patient was fully independent prior to admission and reports fall was just a mishap - tripping over trashcan.  Will sign off. 09/18/2011 Regina Hood, [place]

## 2011-09-19 LAB — GLUCOSE, CAPILLARY: Glucose-Capillary: 89 mg/dL (ref 70–99)

## 2011-09-19 NOTE — Progress Notes (Signed)
Subjective: 2 Days Post-Op Procedure(s) (LRB): OPEN REDUCTION INTERNAL FIXATION (ORIF) PROXIMAL HUMERUS FRACTURE (Left) Patient reports pain as moderate.   Controlled with percocet.  Worked with OT yesterday.  Wants to go home.  Objective: Vital signs in last 24 hours: Temp:  [98.4 F (36.9 C)-99 F (37.2 C)] 99 F (37.2 C) (06/02 0501) Pulse Rate:  [98-106] 98  (06/02 0501) Resp:  [16-18] 16  (06/02 0501) BP: (104-123)/(67-85) 123/85 mmHg (06/02 0501) SpO2:  [94 %-96 %] 94 % (06/02 0501)  Intake/Output from previous day: 06/01 0701 - 06/02 0700 In: 1460 [P.O.:1460] Out: 750 [Urine:750] Intake/Output this shift:     Basename 09/18/11 0705 09/17/11 0809  HGB 11.1* 11.6*    Basename 09/18/11 0705 09/17/11 0809  WBC -- 8.3  RBC -- 3.84*  HCT 34.0* 35.6*  PLT -- 264    Basename 09/18/11 0705 09/17/11 0809  NA 135 135  K 4.2 4.1  CL 96 96  CO2 26 26  BUN 14 16  CREATININE 0.54 0.63  GLUCOSE 145* 124*  CALCIUM 9.0 9.4   No results found for this basename: LABPT:2,INR:2 in the last 72 hours  incision CDI.  NVI L UE.  Mild ecchymosis at anterior arm.  Assessment/Plan: 2 Days Post-Op Procedure(s) (LRB): OPEN REDUCTION INTERNAL FIXATION (ORIF) PROXIMAL HUMERUS FRACTURE (Left) D/c home today.  Pt will work on elbow and wrist ROM and wear sling at all times o/w.  Dressing changed today.  Wylene Simmer 09/19/2011, 9:45 AM

## 2011-09-19 NOTE — Progress Notes (Signed)
Occupational Therapy Treatment Patient Details Name: Regina Hood MRN: [id number] DOB: October 09, 1940 Today's Date: 09/19/2011 Time: 1368-5992 OT Time Calculation (min): 12 min  OT Assessment / Plan / Recommendation Comments on Treatment Session Treatment session focused on self care education and precautions.  Pt able to independently perform wrist and elbow AROM.  Pt reports that MD that saw her this morning told her not to move her shoulder until seen at f/u appt.  However, pt with AAROM orders from [employer].  Advised pt to clarify shoulder ROM limitations tomorrow when she calls to schedule her f/u appt.     Follow Up Recommendations  Outpatient OT    Barriers to Discharge       Equipment Recommendations  None recommended by OT    Recommendations for Other Services    Frequency Min 3X/week   Plan Discharge plan remains appropriate    Precautions / Restrictions Precautions Precautions: Shoulder Type of Shoulder Precautions: AAROM shoulder flexion, pull setup over bed,  Precaution Comments: shoulder handout Restrictions RUE Weight Bearing: Non weight bearing   Pertinent Vitals/Pain See vitals    ADL  Upper Body Dressing: Simulated;Supervision/safety Where Assessed - Upper Body Dressing: Unsupported sitting ADL Comments: Pt able to independently verbalize correct UB dressing technique.  Pt waiting to don clothing until discharge  for comfort.      OT Diagnosis:    OT Problem List:   OT Treatment Interventions:     OT Goals ADL Goals Pt Will Perform Upper Body Dressing: with set-up;Sit to stand from chair;Sit to stand from bed ADL Goal: Upper Body Dressing - Progress: Progressing toward goals Miscellaneous OT Goals Miscellaneous OT Goal #1: Pt will perform bed mobility Mod I as precursor to adls OT Goal: Miscellaneous Goal #1 - Progress: Progressing toward goals  Visit Information  Last OT Received On: 09/19/11 Assistance Needed: +1    Subjective Data      Prior  Functioning       Cognition  Overall Cognitive Status: Appears within functional limits for tasks assessed/performed Arousal/Alertness: Awake/alert Orientation Level: Appears intact for tasks assessed Behavior During Session: [facility] for tasks performed    Mobility Bed Mobility Bed Mobility: Supine to Sit Supine to Sit: 4: Min guard   Exercises    Balance    End of Session OT - End of Session Activity Tolerance: Patient tolerated treatment well Patient left: in bed;with call bell/phone within reach  09/19/2011 Darrol Jump OTR/L Pager [phone] Office [phone]  Darrol Jump 09/19/2011, 10:33 AM

## 2011-09-20 NOTE — Progress Notes (Signed)
CARE MANAGEMENT NOTE 09/20/2011  Patient:  Regina Hood, Regina Hood   Account Number:  [id number]  Date Initiated:  09/20/2011  Documentation initiated by:  Ricki Miller  Subjective/Objective Assessment:     Action/Plan:   patient had no HH needs identified   Anticipated DC Date:  09/19/2011   Anticipated DC Plan:  HOME/SELF CARE         Choice offered to / List presented to:             Status of service:  Completed, signed off

## 2011-09-21 ENCOUNTER — Encounter (HOSPITAL_COMMUNITY): Payer: Self-pay | Admitting: Orthopedic Surgery

## 2012-04-25 ENCOUNTER — Ambulatory Visit: Payer: Self-pay | Admitting: Internal Medicine

## 2012-06-21 ENCOUNTER — Ambulatory Visit: Payer: Self-pay | Admitting: Cardiology

## 2012-08-29 ENCOUNTER — Ambulatory Visit: Payer: Self-pay | Admitting: Internal Medicine

## 2012-09-27 ENCOUNTER — Ambulatory Visit: Payer: Self-pay | Admitting: Internal Medicine

## 2013-05-01 ENCOUNTER — Ambulatory Visit: Payer: Self-pay | Admitting: Internal Medicine

## 2013-09-26 DIAGNOSIS — IMO0002 Reserved for concepts with insufficient information to code with codable children: Secondary | ICD-10-CM | POA: Insufficient documentation

## 2013-09-26 DIAGNOSIS — E119 Type 2 diabetes mellitus without complications: Secondary | ICD-10-CM | POA: Insufficient documentation

## 2013-09-26 DIAGNOSIS — D869 Sarcoidosis, unspecified: Secondary | ICD-10-CM | POA: Insufficient documentation

## 2013-09-26 DIAGNOSIS — K5190 Ulcerative colitis, unspecified, without complications: Secondary | ICD-10-CM | POA: Insufficient documentation

## 2013-09-26 DIAGNOSIS — F329 Major depressive disorder, single episode, unspecified: Secondary | ICD-10-CM | POA: Insufficient documentation

## 2013-09-26 DIAGNOSIS — F419 Anxiety disorder, unspecified: Secondary | ICD-10-CM | POA: Insufficient documentation

## 2014-08-12 ENCOUNTER — Ambulatory Visit
Admit: 2014-08-12 | Disposition: A | Discharge status: Home / Self Care | Payer: Self-pay | Attending: Internal Medicine | Admitting: Internal Medicine

## 2014-08-16 ENCOUNTER — Ambulatory Visit
Admit: 2014-08-16 | Disposition: A | Discharge status: Home / Self Care | Payer: Self-pay | Attending: Internal Medicine | Admitting: Internal Medicine

## 2014-08-22 ENCOUNTER — Other Ambulatory Visit: Payer: Self-pay | Admitting: Internal Medicine

## 2014-10-29 ENCOUNTER — Other Ambulatory Visit: Payer: Self-pay | Admitting: Internal Medicine

## 2014-10-29 DIAGNOSIS — N63 Unspecified lump in unspecified breast: Secondary | ICD-10-CM

## 2015-02-05 ENCOUNTER — Other Ambulatory Visit: Payer: Self-pay | Admitting: Internal Medicine

## 2015-02-05 DIAGNOSIS — R519 Headache, unspecified: Secondary | ICD-10-CM

## 2015-02-05 DIAGNOSIS — R51 Headache: Principal | ICD-10-CM

## 2015-02-10 ENCOUNTER — Ambulatory Visit: Admission: RE | Admit: 2015-02-10 | Payer: Self-pay | Source: Ambulatory Visit

## 2015-02-17 ENCOUNTER — Ambulatory Visit: Payer: Commercial Managed Care - HMO

## 2015-02-17 ENCOUNTER — Other Ambulatory Visit: Payer: Self-pay

## 2015-03-05 ENCOUNTER — Ambulatory Visit
Admission: RE | Admit: 2015-03-05 | Discharge: 2015-03-05 | Disposition: A | Discharge status: Home / Self Care | Payer: Commercial Managed Care - HMO | Source: Ambulatory Visit | Attending: Internal Medicine | Admitting: Internal Medicine

## 2015-03-05 ENCOUNTER — Other Ambulatory Visit: Payer: Self-pay | Admitting: Internal Medicine

## 2015-03-05 DIAGNOSIS — N63 Unspecified lump in unspecified breast: Secondary | ICD-10-CM

## 2015-05-08 DIAGNOSIS — L931 Subacute cutaneous lupus erythematosus: Secondary | ICD-10-CM | POA: Diagnosis not present

## 2015-05-08 DIAGNOSIS — E119 Type 2 diabetes mellitus without complications: Secondary | ICD-10-CM | POA: Diagnosis not present

## 2015-05-08 DIAGNOSIS — R768 Other specified abnormal immunological findings in serum: Secondary | ICD-10-CM | POA: Diagnosis not present

## 2015-05-08 DIAGNOSIS — R21 Rash and other nonspecific skin eruption: Secondary | ICD-10-CM | POA: Diagnosis not present

## 2015-05-08 DIAGNOSIS — J849 Interstitial pulmonary disease, unspecified: Secondary | ICD-10-CM | POA: Diagnosis not present

## 2015-05-12 DIAGNOSIS — K219 Gastro-esophageal reflux disease without esophagitis: Secondary | ICD-10-CM | POA: Diagnosis not present

## 2015-05-12 DIAGNOSIS — B9689 Other specified bacterial agents as the cause of diseases classified elsewhere: Secondary | ICD-10-CM | POA: Diagnosis not present

## 2015-05-12 DIAGNOSIS — R0602 Shortness of breath: Secondary | ICD-10-CM | POA: Diagnosis not present

## 2015-05-12 DIAGNOSIS — F329 Major depressive disorder, single episode, unspecified: Secondary | ICD-10-CM | POA: Diagnosis not present

## 2015-05-12 DIAGNOSIS — F419 Anxiety disorder, unspecified: Secondary | ICD-10-CM | POA: Diagnosis not present

## 2015-05-12 DIAGNOSIS — J208 Acute bronchitis due to other specified organisms: Secondary | ICD-10-CM | POA: Diagnosis not present

## 2015-05-12 DIAGNOSIS — E119 Type 2 diabetes mellitus without complications: Secondary | ICD-10-CM | POA: Diagnosis not present

## 2015-05-12 DIAGNOSIS — Z79899 Other long term (current) drug therapy: Secondary | ICD-10-CM | POA: Diagnosis not present

## 2015-05-12 DIAGNOSIS — J439 Emphysema, unspecified: Secondary | ICD-10-CM | POA: Diagnosis not present

## 2015-05-12 DIAGNOSIS — I10 Essential (primary) hypertension: Secondary | ICD-10-CM | POA: Diagnosis not present

## 2015-05-18 DIAGNOSIS — J449 Chronic obstructive pulmonary disease, unspecified: Secondary | ICD-10-CM | POA: Diagnosis not present

## 2015-05-22 DIAGNOSIS — E119 Type 2 diabetes mellitus without complications: Secondary | ICD-10-CM | POA: Diagnosis not present

## 2015-05-22 DIAGNOSIS — Z79899 Other long term (current) drug therapy: Secondary | ICD-10-CM | POA: Diagnosis not present

## 2015-05-22 DIAGNOSIS — M329 Systemic lupus erythematosus, unspecified: Secondary | ICD-10-CM | POA: Diagnosis not present

## 2015-06-17 DIAGNOSIS — J449 Chronic obstructive pulmonary disease, unspecified: Secondary | ICD-10-CM | POA: Diagnosis not present

## 2015-07-01 DIAGNOSIS — J439 Emphysema, unspecified: Secondary | ICD-10-CM | POA: Diagnosis not present

## 2015-07-01 DIAGNOSIS — M329 Systemic lupus erythematosus, unspecified: Secondary | ICD-10-CM | POA: Diagnosis not present

## 2015-07-01 DIAGNOSIS — D869 Sarcoidosis, unspecified: Secondary | ICD-10-CM | POA: Diagnosis not present

## 2015-07-01 DIAGNOSIS — R0609 Other forms of dyspnea: Secondary | ICD-10-CM | POA: Diagnosis not present

## 2015-07-15 DIAGNOSIS — R809 Proteinuria, unspecified: Secondary | ICD-10-CM | POA: Diagnosis not present

## 2015-07-15 DIAGNOSIS — E1129 Type 2 diabetes mellitus with other diabetic kidney complication: Secondary | ICD-10-CM | POA: Diagnosis not present

## 2015-07-15 DIAGNOSIS — M329 Systemic lupus erythematosus, unspecified: Secondary | ICD-10-CM | POA: Diagnosis not present

## 2015-07-16 DIAGNOSIS — J449 Chronic obstructive pulmonary disease, unspecified: Secondary | ICD-10-CM | POA: Diagnosis not present

## 2015-08-12 DIAGNOSIS — E119 Type 2 diabetes mellitus without complications: Secondary | ICD-10-CM | POA: Diagnosis not present

## 2015-08-12 DIAGNOSIS — I10 Essential (primary) hypertension: Secondary | ICD-10-CM | POA: Diagnosis not present

## 2015-08-12 DIAGNOSIS — Z79899 Other long term (current) drug therapy: Secondary | ICD-10-CM | POA: Diagnosis not present

## 2015-08-16 DIAGNOSIS — J449 Chronic obstructive pulmonary disease, unspecified: Secondary | ICD-10-CM | POA: Diagnosis not present

## 2015-08-18 ENCOUNTER — Other Ambulatory Visit: Payer: Self-pay | Admitting: Internal Medicine

## 2015-08-18 DIAGNOSIS — M329 Systemic lupus erythematosus, unspecified: Secondary | ICD-10-CM | POA: Diagnosis not present

## 2015-08-18 DIAGNOSIS — N63 Unspecified lump in unspecified breast: Secondary | ICD-10-CM

## 2015-08-18 DIAGNOSIS — I10 Essential (primary) hypertension: Secondary | ICD-10-CM | POA: Diagnosis not present

## 2015-08-18 DIAGNOSIS — D869 Sarcoidosis, unspecified: Secondary | ICD-10-CM | POA: Diagnosis not present

## 2015-08-18 DIAGNOSIS — J439 Emphysema, unspecified: Secondary | ICD-10-CM | POA: Diagnosis not present

## 2015-08-21 ENCOUNTER — Other Ambulatory Visit: Payer: Self-pay | Admitting: Internal Medicine

## 2015-08-21 DIAGNOSIS — N63 Unspecified lump in unspecified breast: Secondary | ICD-10-CM

## 2015-09-15 DIAGNOSIS — J449 Chronic obstructive pulmonary disease, unspecified: Secondary | ICD-10-CM | POA: Diagnosis not present

## 2015-09-26 ENCOUNTER — Ambulatory Visit
Admission: RE | Admit: 2015-09-26 | Discharge: 2015-09-26 | Disposition: A | Discharge status: Home / Self Care | Payer: PPO | Source: Ambulatory Visit | Attending: Internal Medicine | Admitting: Internal Medicine

## 2015-09-26 DIAGNOSIS — N63 Unspecified lump in unspecified breast: Secondary | ICD-10-CM

## 2015-09-26 DIAGNOSIS — R928 Other abnormal and inconclusive findings on diagnostic imaging of breast: Secondary | ICD-10-CM | POA: Diagnosis not present

## 2015-09-26 DIAGNOSIS — N6489 Other specified disorders of breast: Secondary | ICD-10-CM | POA: Diagnosis not present

## 2015-09-30 ENCOUNTER — Other Ambulatory Visit: Payer: Self-pay | Admitting: Internal Medicine

## 2015-09-30 DIAGNOSIS — N6489 Other specified disorders of breast: Secondary | ICD-10-CM

## 2015-10-16 DIAGNOSIS — J449 Chronic obstructive pulmonary disease, unspecified: Secondary | ICD-10-CM | POA: Diagnosis not present

## 2015-11-15 DIAGNOSIS — J449 Chronic obstructive pulmonary disease, unspecified: Secondary | ICD-10-CM | POA: Diagnosis not present

## 2015-11-25 DIAGNOSIS — D869 Sarcoidosis, unspecified: Secondary | ICD-10-CM | POA: Diagnosis not present

## 2015-11-25 DIAGNOSIS — J439 Emphysema, unspecified: Secondary | ICD-10-CM | POA: Diagnosis not present

## 2015-11-25 DIAGNOSIS — I272 Other secondary pulmonary hypertension: Secondary | ICD-10-CM | POA: Diagnosis not present

## 2015-11-25 DIAGNOSIS — R0609 Other forms of dyspnea: Secondary | ICD-10-CM | POA: Diagnosis not present

## 2015-12-02 DIAGNOSIS — R768 Other specified abnormal immunological findings in serum: Secondary | ICD-10-CM | POA: Diagnosis not present

## 2015-12-02 DIAGNOSIS — L931 Subacute cutaneous lupus erythematosus: Secondary | ICD-10-CM | POA: Diagnosis not present

## 2015-12-02 DIAGNOSIS — E119 Type 2 diabetes mellitus without complications: Secondary | ICD-10-CM | POA: Diagnosis not present

## 2015-12-02 DIAGNOSIS — J849 Interstitial pulmonary disease, unspecified: Secondary | ICD-10-CM | POA: Diagnosis not present

## 2015-12-16 DIAGNOSIS — J449 Chronic obstructive pulmonary disease, unspecified: Secondary | ICD-10-CM | POA: Diagnosis not present

## 2015-12-16 DIAGNOSIS — J439 Emphysema, unspecified: Secondary | ICD-10-CM | POA: Diagnosis not present

## 2015-12-16 DIAGNOSIS — R0609 Other forms of dyspnea: Secondary | ICD-10-CM | POA: Diagnosis not present

## 2015-12-16 DIAGNOSIS — I272 Other secondary pulmonary hypertension: Secondary | ICD-10-CM | POA: Diagnosis not present

## 2015-12-16 DIAGNOSIS — D869 Sarcoidosis, unspecified: Secondary | ICD-10-CM | POA: Diagnosis not present

## 2016-01-07 DIAGNOSIS — F419 Anxiety disorder, unspecified: Secondary | ICD-10-CM | POA: Diagnosis not present

## 2016-01-07 DIAGNOSIS — I10 Essential (primary) hypertension: Secondary | ICD-10-CM | POA: Insufficient documentation

## 2016-01-07 DIAGNOSIS — Z79899 Other long term (current) drug therapy: Secondary | ICD-10-CM | POA: Diagnosis not present

## 2016-01-07 DIAGNOSIS — J019 Acute sinusitis, unspecified: Secondary | ICD-10-CM | POA: Diagnosis not present

## 2016-01-07 DIAGNOSIS — B9689 Other specified bacterial agents as the cause of diseases classified elsewhere: Secondary | ICD-10-CM | POA: Diagnosis not present

## 2016-01-07 DIAGNOSIS — R21 Rash and other nonspecific skin eruption: Secondary | ICD-10-CM | POA: Diagnosis not present

## 2016-01-07 DIAGNOSIS — F329 Major depressive disorder, single episode, unspecified: Secondary | ICD-10-CM | POA: Diagnosis not present

## 2016-01-07 DIAGNOSIS — J439 Emphysema, unspecified: Secondary | ICD-10-CM | POA: Diagnosis not present

## 2016-01-07 DIAGNOSIS — E119 Type 2 diabetes mellitus without complications: Secondary | ICD-10-CM | POA: Diagnosis not present

## 2016-01-16 DIAGNOSIS — J449 Chronic obstructive pulmonary disease, unspecified: Secondary | ICD-10-CM | POA: Diagnosis not present

## 2016-01-27 ENCOUNTER — Other Ambulatory Visit
Admission: RE | Admit: 2016-01-27 | Discharge: 2016-01-27 | Disposition: A | Discharge status: Home / Self Care | Payer: PPO | Source: Ambulatory Visit | Attending: Rheumatology | Admitting: Rheumatology

## 2016-01-27 DIAGNOSIS — M25561 Pain in right knee: Secondary | ICD-10-CM | POA: Insufficient documentation

## 2016-01-27 DIAGNOSIS — J209 Acute bronchitis, unspecified: Secondary | ICD-10-CM | POA: Diagnosis not present

## 2016-01-27 LAB — SYNOVIAL CELL COUNT + DIFF, W/ CRYSTALS
CRYSTALS FLUID: NONE SEEN
Eosinophils-Synovial: 0 %
Lymphocytes-Synovial Fld: 25 %
Monocyte-Macrophage-Synovial Fluid: 29 %
NEUTROPHIL, SYNOVIAL: 46 %
OTHER CELLS-SYN: 0
WBC, SYNOVIAL: 688 /mm3 — AB (ref 0–200)

## 2016-02-15 DIAGNOSIS — J449 Chronic obstructive pulmonary disease, unspecified: Secondary | ICD-10-CM | POA: Diagnosis not present

## 2016-03-17 DIAGNOSIS — J449 Chronic obstructive pulmonary disease, unspecified: Secondary | ICD-10-CM | POA: Diagnosis not present

## 2016-04-06 DIAGNOSIS — D869 Sarcoidosis, unspecified: Secondary | ICD-10-CM | POA: Diagnosis not present

## 2016-04-06 DIAGNOSIS — R0602 Shortness of breath: Secondary | ICD-10-CM | POA: Diagnosis not present

## 2016-04-06 DIAGNOSIS — M329 Systemic lupus erythematosus, unspecified: Secondary | ICD-10-CM | POA: Diagnosis not present

## 2016-04-06 DIAGNOSIS — Z79899 Other long term (current) drug therapy: Secondary | ICD-10-CM | POA: Diagnosis not present

## 2016-04-06 DIAGNOSIS — I10 Essential (primary) hypertension: Secondary | ICD-10-CM | POA: Diagnosis not present

## 2016-04-06 DIAGNOSIS — Z13220 Encounter for screening for lipoid disorders: Secondary | ICD-10-CM | POA: Diagnosis not present

## 2016-04-06 DIAGNOSIS — J300 Vasomotor rhinitis: Secondary | ICD-10-CM | POA: Diagnosis not present

## 2016-04-06 DIAGNOSIS — F418 Other specified anxiety disorders: Secondary | ICD-10-CM | POA: Diagnosis not present

## 2016-04-06 DIAGNOSIS — E119 Type 2 diabetes mellitus without complications: Secondary | ICD-10-CM | POA: Diagnosis not present

## 2016-04-06 DIAGNOSIS — Z0000 Encounter for general adult medical examination without abnormal findings: Secondary | ICD-10-CM | POA: Diagnosis not present

## 2016-04-16 DIAGNOSIS — J449 Chronic obstructive pulmonary disease, unspecified: Secondary | ICD-10-CM | POA: Diagnosis not present

## 2016-05-14 ENCOUNTER — Ambulatory Visit
Admission: RE | Admit: 2016-05-14 | Discharge: 2016-05-14 | Disposition: A | Discharge status: Home / Self Care | Payer: PPO | Source: Ambulatory Visit | Attending: Internal Medicine | Admitting: Internal Medicine

## 2016-05-14 DIAGNOSIS — R928 Other abnormal and inconclusive findings on diagnostic imaging of breast: Secondary | ICD-10-CM | POA: Diagnosis not present

## 2016-05-14 DIAGNOSIS — N6489 Other specified disorders of breast: Secondary | ICD-10-CM

## 2016-05-14 DIAGNOSIS — N6313 Unspecified lump in the right breast, lower outer quadrant: Secondary | ICD-10-CM | POA: Diagnosis not present

## 2016-05-14 DIAGNOSIS — N6311 Unspecified lump in the right breast, upper outer quadrant: Secondary | ICD-10-CM | POA: Diagnosis not present

## 2016-05-24 ENCOUNTER — Encounter: Payer: Self-pay | Admitting: Internal Medicine

## 2016-05-24 ENCOUNTER — Ambulatory Visit (INDEPENDENT_AMBULATORY_CARE_PROVIDER_SITE_OTHER): Payer: PPO | Admitting: Internal Medicine

## 2016-05-24 VITALS — BP 140/90 | HR 96 | Ht 61.0 in | Wt 175.0 lb

## 2016-05-24 DIAGNOSIS — J449 Chronic obstructive pulmonary disease, unspecified: Secondary | ICD-10-CM

## 2016-05-24 MED ORDER — PREDNISONE 5 MG PO TABS: ORAL_TABLET | ORAL | 0 refills | Status: DC

## 2016-05-24 MED ORDER — UMECLIDINIUM BROMIDE 62.5 MCG/INH IN AEPB: 1.0000 | INHALATION_SPRAY | Freq: Every day | RESPIRATORY_TRACT | 0 refills | Status: AC

## 2016-05-24 MED ORDER — UMECLIDINIUM BROMIDE 62.5 MCG/INH IN AEPB: 1.0000 | INHALATION_SPRAY | Freq: Every day | RESPIRATORY_TRACT | 5 refills | Status: AC

## 2016-05-24 NOTE — Addendum Note (Signed)
Addended by: Maryanna Shape A on: 05/24/2016 11:51 AM   Modules accepted: Orders

## 2016-05-24 NOTE — Patient Instructions (Signed)
PREDNISONE INSTRUCTIONS Take 10 MG daily for 10 days Then take 5MG daily for 10 days then stop   Start Incruse Continue Advair

## 2016-05-24 NOTE — Progress Notes (Signed)
[facility] Consultation      Date: 05/24/2016,   MRN# [id number] Regina Hood 1940/09/02 Code Status:  Code Status History    Date Active Date Inactive Code Status Order ID Comments User Context   09/17/2011  2:43 PM 09/19/2011  4:24 PM Full Code [id number]  Cristi Loron, RN Inpatient     Hosp day:@LENGTHOFSTAYDAYS @ Referring MD: @ATDPROV @     PCP:      AdmissionWeight: 175 lb (79.4 kg)                 CurrentWeight: 175 lb (79.4 kg) Regina Hood is a 76 y.o. old female seen in consultation for COPD at the request of patient     CHIEF COMPLAINT:   SOB   HISTORY OF PRESENT ILLNESS   76 yo female seen today for assessment of her lung problems and COPD After reviewing her chart  1.she has sarcoidosis dx in 1970 with [place] 2.she has dx of COPD-for many years on advair for 2 months 3.she has ILD with fibrosis-from Sarcoidosis   Patient has chronic SOB for many years, she has been prescribed oxygen at night but has not been using it She has chronic SOB and WOB She has no infections at this time She wanted a second opinion with her Lung problems She has no acute issues at this time She states that she has had 6MWT and PFT's back in December On 04/06/16 she was prescribed Prednisone 20 mg daily and has been on it ever since then She had worsening SOB and DOE when she was started on Prednisone She feels much better since starting her prednisone-but now, she would like to stop them Her resp status seems to be much improved and back to her baseline A 6 MWT reveals hypoxia with exertion and she DID noT want to use oxygen at that time.  She has been seen by Dr. Raul Del on the past and she would like a second opinion at this time Her 6MWT reveals hypoxia with exertion  SHe is comfortable about her visit and is willing to start her using her oxygen  She understans her lung problems now. SHer he sta  She states t  PAST MEDICAL HISTORY   Past Medical  History:  Diagnosis Date  . Arthritis   . COPD (chronic obstructive pulmonary disease) ([place])   . Depression   . Diabetes mellitus   . Diverticulitis   . GERD (gastroesophageal reflux disease)   . GI bleed   . Headache(784.0)   . Hypertension   . Ulcerative colitis ([place])      SURGICAL HISTORY   Past Surgical History:  Procedure Laterality Date  . ABDOMINAL HYSTERECTOMY    . BREAST BIOPSY Right   . COLONOSCOPY    . ORIF HUMERUS FRACTURE  09/17/2011   Procedure: OPEN REDUCTION INTERNAL FIXATION (ORIF) PROXIMAL HUMERUS FRACTURE;  Surgeon: Augustin Schooling, MD;  Location: [place];  Service: Orthopedics;  Laterality: Left;     FAMILY HISTORY   Family History  Problem Relation Age of Onset  . Breast cancer Mother 3     SOCIAL HISTORY   Social History  Substance Use Topics  . Smoking status: Former Smoker    Packs/day: 1.00    Years: 10.00    Types: Cigarettes  . Smokeless tobacco: Never Used     Comment: quit smoking in 2003  . Alcohol use No     MEDICATIONS    Home Medication:  Current  Outpatient Rx  . Order #: [id number] Class: Historical Med  . Order #: [id number] Class: Historical Med  . Order #: [id number] Class: Historical Med  . Order #: [id number] Class: Historical Med  . Order #: [id number] Class: Historical Med  . Order #: [id number] Class: Historical Med  . Order #: [id number] Class: Historical Med  . Order #: [id number] Class: Historical Med  . Order #: [id number] Class: Historical Med  . Order #: [id number] Class: Historical Med    Current Medication:  Current Outpatient Prescriptions:  .  DULoxetine (CYMBALTA) 60 MG capsule, Take 60 mg by mouth every evening., Disp: , Rfl:  .  estrogens, conjugated, (PREMARIN) 0.625 MG tablet, Take 0.625 mg by mouth daily. Take daily for 21 days then do not take for 7 days., Disp: , Rfl:  .  gabapentin (NEURONTIN) 100 MG capsule, Take 100 mg by mouth 2 (two) times daily., Disp: , Rfl:  .  HYDROcodone-acetaminophen (NORCO) 5-325 MG per  tablet, Take 1-2 tablets by mouth every 6 (six) hours as needed. For pain, Disp: , Rfl:  .  mesalamine (ASACOL) 400 MG EC tablet, Take 400 mg by mouth 2 (two) times daily., Disp: , Rfl:  .  metFORMIN (GLUCOPHAGE) 500 MG tablet, Take 500 mg by mouth 2 (two) times daily with a meal., Disp: , Rfl:  .  Olmesartan-Amlodipine-HCTZ (TRIBENZOR) 40-5-25 MG TABS, Take 1 tablet by mouth daily., Disp: , Rfl:  .  pantoprazole (PROTONIX) 40 MG tablet, Take 40 mg by mouth daily., Disp: , Rfl:  .  traMADol (ULTRAM) 50 MG tablet, Take 50 mg by mouth daily., Disp: , Rfl:  .  venlafaxine XR (EFFEXOR-XR) 75 MG 24 hr capsule, Take 75 mg by mouth daily., Disp: , Rfl:     ALLERGIES   Penicillamine     REVIEW OF SYSTEMS   Review of Systems  Constitutional: Negative for chills, diaphoresis, fever, malaise/fatigue and weight loss.  HENT: Negative for congestion and hearing loss.   Eyes: Negative for blurred vision and double vision.  Respiratory: Positive for shortness of breath and wheezing. Negative for cough, hemoptysis and sputum production.   Cardiovascular: Negative for chest pain, palpitations and orthopnea.  Gastrointestinal: Negative for abdominal pain, heartburn, nausea and vomiting.  Genitourinary: Negative for dysuria and urgency.  Musculoskeletal: Negative for back pain, myalgias and neck pain.  Skin: Negative for rash.  Neurological: Negative for dizziness and weakness.  Endo/Heme/Allergies: Does not bruise/bleed easily.  Psychiatric/Behavioral: Negative for depression.  All other systems reviewed and are negative.    VS: BP 140/90 (BP Location: Left Arm, Cuff Size: Normal)   Pulse 96   Ht 5' 1"  (1.549 m)   Wt 175 lb (79.4 kg)   SpO2 97%   BMI 33.07 kg/m      PHYSICAL EXAM  Physical Exam  Constitutional: She is oriented to person, place, and time. She appears well-developed and well-nourished. No distress.  HENT:  Head: Normocephalic and atraumatic.  Mouth/Throat: No  oropharyngeal exudate.  Eyes: EOM are normal. Pupils are equal, round, and reactive to light. No scleral icterus.  Neck: Normal range of motion. Neck supple.  Cardiovascular: Normal rate, regular rhythm and normal heart sounds.   No murmur heard. Pulmonary/Chest: No stridor. No respiratory distress. She has no wheezes.  Abdominal: Soft. Bowel sounds are normal.  Musculoskeletal: Normal range of motion. She exhibits no edema.  Neurological: She is alert and oriented to person, place, and time. No cranial nerve deficit.  Skin: Skin is warm. She is not diaphoretic.  Psychiatric: She has a normal  mood and affect.      IMAGING   CT chest reports bibasilar fibrosis  images  COULD NOT BE pulled up  6MWT in 03/2016 reports Hypoxia with exertion at 330M with o2 sat 85%  ^MWT at other Office shows +  ASSESSMENT/PLAN   76 yo pleasant white female with chronic Resp failure on oxygen secondary to Gold Stage A COPD and Pulm Fibrosis from sarcoidosis. I have explained to patient her diagnosis and that she will need oxygen to survive.  She understands her lung disease and prognosis.  AT this time, she would like to wean off steroids which I think is a great plan to achieve.  1.wean prednisone to 10 mg daily for 10 days and then 5 mg daily for 10 days 2.continue Advair 250/50 as prescribed 3.start Incruse(AC) 4.will need to prescribed oxygen at night and with exertion  Follow up in 4 weeks to assess resp status   I have personally obtained a history, examined the patient, evaluated laboratory and independently reviewed imaging results, formulated the assessment and plan and placed orders.  The Patient requires high complexity decision making for assessment and support, frequent evaluation and titration of therapies, application of advanced monitoring technologies and extensive interpretation of multiple databases.   Patient satisfied with Plan of action and management. All questions  answered  Corrin Parker, M.D.  [facility]  Medical Director [place] Director [facility]

## 2016-05-25 ENCOUNTER — Telehealth: Payer: Self-pay | Admitting: Internal Medicine

## 2016-05-25 NOTE — Telephone Encounter (Signed)
Spoke with [facility], who confirmed rx had been received. [facility] states PA is needed for incruse. PA request will be faxed to provided number. Pt is aware and voiced her understanding. Nothing further needed.

## 2016-05-25 NOTE — Telephone Encounter (Signed)
Pt calling stating she is having some prescription issues We sent it to the wrong pharmacy She is using [facility] [address] Please call   She also needs to schedule her walk in test.

## 2016-05-26 ENCOUNTER — Telehealth: Payer: Self-pay | Admitting: Internal Medicine

## 2016-05-26 NOTE — Telephone Encounter (Signed)
Pt would like a call, states her rx for her inhaler is still messed up.

## 2016-05-27 NOTE — Telephone Encounter (Signed)
Pt states she needs a PA for Incruse. Informed pt I would get that started and let her know if approved or denied.

## 2016-05-31 NOTE — Telephone Encounter (Signed)
Initiated PA thru [facility] for Incruse. Key: [id number]# [id number]  Will await response.

## 2016-06-02 ENCOUNTER — Telehealth: Payer: Self-pay | Admitting: *Deleted

## 2016-06-02 DIAGNOSIS — J449 Chronic obstructive pulmonary disease, unspecified: Secondary | ICD-10-CM

## 2016-06-02 NOTE — Telephone Encounter (Signed)
Incruse has been approved until 04/18/2017. Pharmacy informed. Pt informed. Nothing further needed.

## 2016-06-02 NOTE — Telephone Encounter (Signed)
Called pt to inform of PA for Incruse. Pt states she was told by [facility] that she would get O2 ordered. In last OV it states will order O2 for exertion and night time use. Pt states she was with [facility] before and they came and picked up equipment. Pt stated she would like to go back with [facility]. LMOM for pt to call so that she can be informed tha if she goes with [facility] her insurance will only pay 70% and she will have to pay 30%. If she goes with [facility] or [facility] it will be 80/20%. Will await call back and I will order ONO.

## 2016-06-03 ENCOUNTER — Ambulatory Visit (INDEPENDENT_AMBULATORY_CARE_PROVIDER_SITE_OTHER): Payer: PPO | Admitting: *Deleted

## 2016-06-03 DIAGNOSIS — J449 Chronic obstructive pulmonary disease, unspecified: Secondary | ICD-10-CM | POA: Diagnosis not present

## 2016-06-03 NOTE — Progress Notes (Signed)
SMW performed today. 

## 2016-06-04 NOTE — Telephone Encounter (Signed)
Order placed for ONO and O2 for exertion per DK. Nothing further needed.

## 2016-06-04 NOTE — Telephone Encounter (Signed)
Spoke with pt and she will go with [facility]. Order placed for ONO and order for O2 with exertion.

## 2016-06-07 DIAGNOSIS — E119 Type 2 diabetes mellitus without complications: Secondary | ICD-10-CM | POA: Diagnosis not present

## 2016-06-07 DIAGNOSIS — J849 Interstitial pulmonary disease, unspecified: Secondary | ICD-10-CM | POA: Diagnosis not present

## 2016-06-07 DIAGNOSIS — L931 Subacute cutaneous lupus erythematosus: Secondary | ICD-10-CM | POA: Diagnosis not present

## 2016-06-07 DIAGNOSIS — M8580 Other specified disorders of bone density and structure, unspecified site: Secondary | ICD-10-CM | POA: Diagnosis not present

## 2016-06-16 DIAGNOSIS — R319 Hematuria, unspecified: Secondary | ICD-10-CM | POA: Diagnosis not present

## 2016-06-16 DIAGNOSIS — R8290 Unspecified abnormal findings in urine: Secondary | ICD-10-CM | POA: Diagnosis not present

## 2016-06-16 DIAGNOSIS — R21 Rash and other nonspecific skin eruption: Secondary | ICD-10-CM | POA: Diagnosis not present

## 2016-06-17 DIAGNOSIS — R21 Rash and other nonspecific skin eruption: Secondary | ICD-10-CM | POA: Diagnosis not present

## 2016-06-17 DIAGNOSIS — R319 Hematuria, unspecified: Secondary | ICD-10-CM | POA: Diagnosis not present

## 2016-06-17 DIAGNOSIS — R8290 Unspecified abnormal findings in urine: Secondary | ICD-10-CM | POA: Diagnosis not present

## 2016-06-22 ENCOUNTER — Encounter: Payer: Self-pay | Admitting: Internal Medicine

## 2016-06-22 DIAGNOSIS — J449 Chronic obstructive pulmonary disease, unspecified: Secondary | ICD-10-CM

## 2016-06-24 DIAGNOSIS — J449 Chronic obstructive pulmonary disease, unspecified: Secondary | ICD-10-CM | POA: Diagnosis not present

## 2016-06-29 ENCOUNTER — Encounter: Payer: Self-pay | Admitting: Emergency Medicine

## 2016-06-29 ENCOUNTER — Inpatient Hospital Stay
Admission: EM | Admit: 2016-06-29 | Discharge: 2016-07-01 | DRG: 378 | Disposition: A | Discharge status: Home / Self Care | Payer: PPO | Attending: Internal Medicine | Admitting: Internal Medicine

## 2016-06-29 DIAGNOSIS — E119 Type 2 diabetes mellitus without complications: Secondary | ICD-10-CM | POA: Diagnosis present

## 2016-06-29 DIAGNOSIS — Z880 Allergy status to penicillin: Secondary | ICD-10-CM

## 2016-06-29 DIAGNOSIS — Z7984 Long term (current) use of oral hypoglycemic drugs: Secondary | ICD-10-CM

## 2016-06-29 DIAGNOSIS — R945 Abnormal results of liver function studies: Secondary | ICD-10-CM | POA: Diagnosis not present

## 2016-06-29 DIAGNOSIS — E611 Iron deficiency: Secondary | ICD-10-CM | POA: Diagnosis not present

## 2016-06-29 DIAGNOSIS — D638 Anemia in other chronic diseases classified elsewhere: Secondary | ICD-10-CM | POA: Diagnosis present

## 2016-06-29 DIAGNOSIS — Z79899 Other long term (current) drug therapy: Secondary | ICD-10-CM

## 2016-06-29 DIAGNOSIS — K625 Hemorrhage of anus and rectum: Secondary | ICD-10-CM | POA: Diagnosis present

## 2016-06-29 DIAGNOSIS — D62 Acute posthemorrhagic anemia: Secondary | ICD-10-CM | POA: Diagnosis present

## 2016-06-29 DIAGNOSIS — K219 Gastro-esophageal reflux disease without esophagitis: Secondary | ICD-10-CM | POA: Diagnosis not present

## 2016-06-29 DIAGNOSIS — K922 Gastrointestinal hemorrhage, unspecified: Secondary | ICD-10-CM | POA: Diagnosis not present

## 2016-06-29 DIAGNOSIS — J449 Chronic obstructive pulmonary disease, unspecified: Secondary | ICD-10-CM | POA: Diagnosis present

## 2016-06-29 DIAGNOSIS — R7989 Other specified abnormal findings of blood chemistry: Secondary | ICD-10-CM

## 2016-06-29 DIAGNOSIS — I10 Essential (primary) hypertension: Secondary | ICD-10-CM | POA: Diagnosis not present

## 2016-06-29 DIAGNOSIS — D500 Iron deficiency anemia secondary to blood loss (chronic): Secondary | ICD-10-CM | POA: Diagnosis present

## 2016-06-29 DIAGNOSIS — Z7982 Long term (current) use of aspirin: Secondary | ICD-10-CM | POA: Diagnosis not present

## 2016-06-29 DIAGNOSIS — R000 Tachycardia, unspecified: Secondary | ICD-10-CM | POA: Diagnosis not present

## 2016-06-29 DIAGNOSIS — F329 Major depressive disorder, single episode, unspecified: Secondary | ICD-10-CM | POA: Diagnosis present

## 2016-06-29 DIAGNOSIS — Z7189 Other specified counseling: Secondary | ICD-10-CM | POA: Diagnosis not present

## 2016-06-29 DIAGNOSIS — K921 Melena: Secondary | ICD-10-CM | POA: Diagnosis not present

## 2016-06-29 DIAGNOSIS — Z66 Do not resuscitate: Secondary | ICD-10-CM | POA: Diagnosis present

## 2016-06-29 DIAGNOSIS — Z803 Family history of malignant neoplasm of breast: Secondary | ICD-10-CM | POA: Diagnosis not present

## 2016-06-29 DIAGNOSIS — M329 Systemic lupus erythematosus, unspecified: Secondary | ICD-10-CM | POA: Diagnosis present

## 2016-06-29 DIAGNOSIS — Z90710 Acquired absence of both cervix and uterus: Secondary | ICD-10-CM

## 2016-06-29 DIAGNOSIS — Z87891 Personal history of nicotine dependence: Secondary | ICD-10-CM

## 2016-06-29 DIAGNOSIS — L930 Discoid lupus erythematosus: Secondary | ICD-10-CM | POA: Diagnosis not present

## 2016-06-29 HISTORY — DX: Reserved for concepts with insufficient information to code with codable children: IMO0002

## 2016-06-29 HISTORY — DX: Systemic lupus erythematosus, unspecified: M32.9

## 2016-06-29 LAB — COMPREHENSIVE METABOLIC PANEL
ALK PHOS: 153 U/L — AB (ref 38–126)
ALT: 61 U/L — AB (ref 14–54)
AST: 45 U/L — ABNORMAL HIGH (ref 15–41)
Albumin: 3.4 g/dL — ABNORMAL LOW (ref 3.5–5.0)
Anion gap: 5 (ref 5–15)
BILIRUBIN TOTAL: 0.4 mg/dL (ref 0.3–1.2)
BUN: 16 mg/dL (ref 6–20)
CO2: 28 mmol/L (ref 22–32)
CREATININE: 0.81 mg/dL (ref 0.44–1.00)
Calcium: 8.6 mg/dL — ABNORMAL LOW (ref 8.9–10.3)
Chloride: 101 mmol/L (ref 101–111)
GFR calc Af Amer: >60 mL/min (ref >60)
Glucose, Bld: 224 mg/dL — ABNORMAL HIGH (ref 65–99)
Potassium: 4 mmol/L (ref 3.5–5.1)
Sodium: 134 mmol/L — ABNORMAL LOW (ref 135–145)
TOTAL PROTEIN: 6.7 g/dL (ref 6.5–8.1)

## 2016-06-29 LAB — CBC
HCT: 20.4 % — ABNORMAL LOW (ref 35.0–47.0)
Hemoglobin: 6.5 g/dL — ABNORMAL LOW (ref 12.0–16.0)
MCH: 25.9 pg — ABNORMAL LOW (ref 26.0–34.0)
MCHC: 31.7 g/dL — ABNORMAL LOW (ref 32.0–36.0)
MCV: 81.9 fL (ref 80.0–100.0)
PLATELETS: 325 10*3/uL (ref 150–440)
RBC: 2.49 MIL/uL — AB (ref 3.80–5.20)
RDW: 18.3 % — ABNORMAL HIGH (ref 11.5–14.5)
WBC: 11.8 10*3/uL — AB (ref 3.6–11.0)

## 2016-06-29 LAB — HEMOGLOBIN: HEMOGLOBIN: 5.9 g/dL — AB (ref 12.0–16.0)

## 2016-06-29 LAB — GLUCOSE, CAPILLARY: GLUCOSE-CAPILLARY: 136 mg/dL — AB (ref 65–99)

## 2016-06-29 LAB — ABO/RH: ABO/RH(D): O POS

## 2016-06-29 LAB — PREPARE RBC (CROSSMATCH)

## 2016-06-29 MED ORDER — ACETAMINOPHEN 325 MG PO TABS
650.0000 mg | ORAL_TABLET | Freq: Four times a day (QID) | ORAL | Status: DC | PRN
  Administered 2016-06-29 – 2016-06-30 (×2): 650 mg via ORAL
  Filled 2016-06-29 (×2): qty 2

## 2016-06-29 MED ORDER — ONDANSETRON HCL 4 MG PO TABS: 4.0000 mg | ORAL_TABLET | Freq: Four times a day (QID) | ORAL | Status: DC | PRN

## 2016-06-29 MED ORDER — POLYETHYLENE GLYCOL 3350 17 G PO PACK: 17.0000 g | PACK | Freq: Every day | ORAL | Status: DC | PRN

## 2016-06-29 MED ORDER — HYDROXYCHLOROQUINE SULFATE 200 MG PO TABS
200.0000 mg | ORAL_TABLET | Freq: Two times a day (BID) | ORAL | Status: DC
  Administered 2016-06-29 – 2016-07-01 (×4): 200 mg via ORAL
  Filled 2016-06-29 (×4): qty 1

## 2016-06-29 MED ORDER — AMLODIPINE BESYLATE 5 MG PO TABS
5.0000 mg | ORAL_TABLET | Freq: Every day | ORAL | Status: DC
  Administered 2016-06-30 – 2016-07-01 (×2): 5 mg via ORAL
  Filled 2016-06-29 (×2): qty 1

## 2016-06-29 MED ORDER — TRAMADOL HCL 50 MG PO TABS
25.0000 mg | ORAL_TABLET | Freq: Two times a day (BID) | ORAL | Status: DC
  Administered 2016-06-29 – 2016-07-01 (×4): 25 mg via ORAL
  Filled 2016-06-29 (×4): qty 1

## 2016-06-29 MED ORDER — DOCUSATE SODIUM 100 MG PO CAPS
100.0000 mg | ORAL_CAPSULE | Freq: Two times a day (BID) | ORAL | Status: DC
  Administered 2016-06-30 – 2016-07-01 (×3): 100 mg via ORAL
  Filled 2016-06-29 (×4): qty 1

## 2016-06-29 MED ORDER — ONDANSETRON HCL 4 MG/2ML IJ SOLN: 4.0000 mg | Freq: Four times a day (QID) | INTRAMUSCULAR | Status: DC | PRN

## 2016-06-29 MED ORDER — ACETAMINOPHEN 650 MG RE SUPP: 650.0000 mg | Freq: Four times a day (QID) | RECTAL | Status: DC | PRN

## 2016-06-29 MED ORDER — GABAPENTIN 100 MG PO CAPS
100.0000 mg | ORAL_CAPSULE | Freq: Two times a day (BID) | ORAL | Status: DC
  Administered 2016-06-29 – 2016-07-01 (×4): 100 mg via ORAL
  Filled 2016-06-29 (×4): qty 1

## 2016-06-29 MED ORDER — SODIUM CHLORIDE 0.9 % IV SOLN
10.0000 mL/h | Freq: Once | INTRAVENOUS | Status: AC
  Administered 2016-06-29: 19:00:00 10 mL/h via INTRAVENOUS

## 2016-06-29 MED ORDER — PANTOPRAZOLE SODIUM 40 MG PO TBEC
40.0000 mg | DELAYED_RELEASE_TABLET | Freq: Every day | ORAL | Status: DC
  Administered 2016-06-30 – 2016-07-01 (×2): 40 mg via ORAL
  Filled 2016-06-29 (×2): qty 1

## 2016-06-29 MED ORDER — SODIUM CHLORIDE 0.9% FLUSH
3.0000 mL | Freq: Two times a day (BID) | INTRAVENOUS | Status: DC
  Administered 2016-06-29 – 2016-07-01 (×5): 3 mL via INTRAVENOUS

## 2016-06-29 MED ORDER — ALBUTEROL SULFATE (2.5 MG/3ML) 0.083% IN NEBU: 2.5000 mg | INHALATION_SOLUTION | RESPIRATORY_TRACT | Status: DC | PRN

## 2016-06-29 MED ORDER — VENLAFAXINE HCL ER 75 MG PO CP24
225.0000 mg | ORAL_CAPSULE | Freq: Every day | ORAL | Status: DC
  Administered 2016-06-30 – 2016-07-01 (×2): 225 mg via ORAL
  Filled 2016-06-29 (×2): qty 3

## 2016-06-29 MED ORDER — INSULIN ASPART 100 UNIT/ML ~~LOC~~ SOLN: 0.0000 [IU] | Freq: Every day | SUBCUTANEOUS | Status: DC

## 2016-06-29 MED ORDER — METFORMIN HCL 500 MG PO TABS
500.0000 mg | ORAL_TABLET | Freq: Two times a day (BID) | ORAL | Status: DC
  Administered 2016-06-30 – 2016-07-01 (×3): 500 mg via ORAL
  Filled 2016-06-29 (×3): qty 1

## 2016-06-29 MED ORDER — INSULIN ASPART 100 UNIT/ML ~~LOC~~ SOLN
0.0000 [IU] | Freq: Three times a day (TID) | SUBCUTANEOUS | Status: DC
  Administered 2016-07-01: 09:00:00 1 [IU] via SUBCUTANEOUS
  Filled 2016-06-29: qty 1

## 2016-06-29 MED ORDER — BISACODYL 10 MG RE SUPP
10.0000 mg | Freq: Every day | RECTAL | Status: DC | PRN
  Filled 2016-06-29: qty 1

## 2016-06-29 MED ORDER — ESTRADIOL 1 MG PO TABS
1.0000 mg | ORAL_TABLET | Freq: Every day | ORAL | Status: DC
Start: 2016-06-30 — End: 2016-07-01
  Administered 2016-06-30 – 2016-07-01 (×2): 1 mg via ORAL
  Filled 2016-06-29 (×2): qty 1

## 2016-06-29 MED ORDER — MOMETASONE FURO-FORMOTEROL FUM 200-5 MCG/ACT IN AERO
2.0000 | INHALATION_SPRAY | Freq: Two times a day (BID) | RESPIRATORY_TRACT | Status: DC
  Administered 2016-06-29 – 2016-06-30 (×2): 2 via RESPIRATORY_TRACT
  Filled 2016-06-29: qty 8.8

## 2016-06-29 NOTE — ED Provider Notes (Signed)
[facility] Emergency Department Provider Note   ____________________________________________    I have reviewed the triage vital signs and the nursing notes.   HISTORY  Chief Complaint Rectal Bleeding     HPI Regina Hood is a 76 y.o. female who presents with complaints of rectal bleeding. Patient reports several days of bright red blood per rectum. She reports a history of colitis and notes that she has needed a blood transfusion the past. She called her gastroenterologist today and they recommended she come to the emergency department. She denies abdominal pain. No fevers or chills. No nausea or vomiting.   Past Medical History:  Diagnosis Date  . Arthritis   . COPD (chronic obstructive pulmonary disease) ([place])   . Depression   . Diabetes mellitus   . Diverticulitis   . GERD (gastroesophageal reflux disease)   . GI bleed   . Headache(784.0)   . Hypertension   . Lupus   . Ulcerative colitis [facility])     Patient Active Problem List   Diagnosis Date Noted  . Proximal humerus fracture, left, closed, initial encounter 09/17/2011    Past Surgical History:  Procedure Laterality Date  . ABDOMINAL HYSTERECTOMY    . BREAST BIOPSY Right   . COLONOSCOPY    . ORIF HUMERUS FRACTURE  09/17/2011   Procedure: OPEN REDUCTION INTERNAL FIXATION (ORIF) PROXIMAL HUMERUS FRACTURE;  Surgeon: Augustin Schooling, MD;  Location: [place];  Service: Orthopedics;  Laterality: Left;    Prior to Admission medications   Medication Sig Start Date End Date Taking? Authorizing Provider  acetaminophen (TYLENOL) 500 MG tablet Take 1,000 mg by mouth 3 (three) times daily.   Yes Historical Provider, MD  ADVAIR DISKUS 250-50 MCG/DOSE AEPB Inhale 1 puff into the lungs daily. 05/05/16  Yes Historical Provider, MD  amLODipine (NORVASC) 5 MG tablet Take 5 mg by mouth daily. 06/01/16  Yes Historical Provider, MD  estradiol (ESTRACE) 1 MG tablet Take 1 mg by mouth daily. 06/16/16  Yes  Historical Provider, MD  gabapentin (NEURONTIN) 100 MG capsule Take 100 mg by mouth 2 (two) times daily.   Yes Historical Provider, MD  hydroxychloroquine (PLAQUENIL) 200 MG tablet Take 200 mg by mouth 2 (two) times daily. 06/16/16  Yes Historical Provider, MD  metFORMIN (GLUCOPHAGE) 500 MG tablet Take 500 mg by mouth 2 (two) times daily with a meal.   Yes Historical Provider, MD  pantoprazole (PROTONIX) 40 MG tablet Take 40 mg by mouth daily.   Yes Historical Provider, MD  traMADol (ULTRAM) 50 MG tablet Take 25 mg by mouth 2 (two) times daily.    Yes Historical Provider, MD  venlafaxine XR (EFFEXOR-XR) 75 MG 24 hr capsule Take 225 mg by mouth daily.    Yes Historical Provider, MD  predniSONE (DELTASONE) 5 MG tablet 2 tablets X 10 days, 1 tablet X 10 days then stop Patient not taking: Reported on 06/29/2016 05/24/16   Regina Lipps, MD     Allergies Penicillamine  Family History  Problem Relation Age of Onset  . Breast cancer Mother 14    Social History Social History  Substance Use Topics  . Smoking status: Former Smoker    Packs/day: 1.00    Years: 10.00    Types: Cigarettes  . Smokeless tobacco: Never Used     Comment: quit smoking in 2003  . Alcohol use No    Review of Systems  Constitutional: No fever/chills  Cardiovascular: Denies chest pain. Respiratory: Denies shortness of  breath. Gastrointestinal: No abdominal pain.  No nausea, no vomiting.   Genitourinary: Negative for dysuria. Musculoskeletal: Negative for back pain. Skin: Negative for rash. Neurological: Negative for headaches or weakness  10-point ROS otherwise negative.  ____________________________________________   PHYSICAL EXAM:  VITAL SIGNS: ED Triage Vitals [06/29/16 1357]  Enc Vitals Group     BP (!) 118/49     Pulse Rate (!) 108     Resp 16     Temp 98 F (36.7 C)     Temp Source Oral     SpO2 95 %     Weight 176 lb (79.8 kg)     Height 5' 1"  (1.549 m)     Head Circumference      Peak Flow       Pain Score      Pain Loc      Pain Edu?      Excl. in [place]?     Constitutional: Alert and oriented. No acute distress. Pleasant and interactive Eyes: Conjunctivae are normal.   Nose: No congestion/rhinnorhea. Mouth/Throat: Mucous membranes are moist.    Cardiovascular: Tachycardia, regular rhythm. Grossly normal heart sounds.  Good peripheral circulation. Respiratory: Normal respiratory effort.  No retractions. Lungs CTAB. Gastrointestinal: Soft and nontender. No distention.  No CVA tenderness. Genitourinary: deferred Musculoskeletal: .  Warm and well perfused Neurologic:  Normal speech and language. No gross focal neurologic deficits are appreciated.  Skin:  Skin is warm, dry and intact. No rash noted. Psychiatric: Mood and affect are normal. Speech and behavior are normal.  ____________________________________________   LABS (all labs ordered are listed, but only abnormal results are displayed)  Labs Reviewed  COMPREHENSIVE METABOLIC PANEL - Abnormal; Notable for the following:       Result Value   Sodium 134 (*)    Glucose, Bld 224 (*)    Calcium 8.6 (*)    Albumin 3.4 (*)    AST 45 (*)    ALT 61 (*)    Alkaline Phosphatase 153 (*)    All other components within normal limits  CBC - Abnormal; Notable for the following:    WBC 11.8 (*)    RBC 2.49 (*)    Hemoglobin 6.5 (*)    HCT 20.4 (*)    MCH 25.9 (*)    MCHC 31.7 (*)    RDW 18.3 (*)    All other components within normal limits  POC OCCULT BLOOD, ED  TYPE AND SCREEN  PREPARE RBC (CROSSMATCH)  ABO/RH   ____________________________________________  EKG  None ____________________________________________  RADIOLOGY  None ____________________________________________   PROCEDURES  Procedure(s) performed: No    Critical Care performed: Yes  CRITICAL CARE Performed by: Lavonia Drafts   Total critical care time: 30 minutes  Critical care time was exclusive of separately billable procedures  and treating other patients.  Critical care was necessary to treat or prevent imminent or life-threatening deterioration.  Critical care was time spent personally by me on the following activities: development of treatment plan with patient and/or surrogate as well as nursing, discussions with consultants, evaluation of patient's response to treatment, examination of patient, obtaining history from patient or surrogate, ordering and performing treatments and interventions, ordering and review of laboratory studies, ordering and review of radiographic studies, pulse oximetry and re-evaluation of patient's condition.  ____________________________________________   INITIAL IMPRESSION / ASSESSMENT AND PLAN / ED COURSE  Pertinent labs & imaging results that were available during my care of the patient were reviewed by me and  considered in my medical decision making (see chart for details).  Patient presents with rectal bleeding, bright red blood. No abdominal pain, no nausea or vomiting. Does have a history of ulcerative colitis. Diverticulosis also on the differential. Her hemoglobin has dropped significantly to 6.5. Last hemoglobin was 10.5 in February. We will transfuse PRBCs, I discussed risks and benefits of blood administration with her and she agrees. Will discuss with GI and admit to medicine for further management    ____________________________________________   FINAL CLINICAL IMPRESSION(S) / ED DIAGNOSES  Final diagnoses:  Acute GI bleeding      NEW MEDICATIONS STARTED DURING THIS VISIT:  New Prescriptions   No medications on file     Note:  This document was prepared using Dragon voice recognition software and may include unintentional dictation errors.    Lavonia Drafts, MD 06/29/16 [phone]

## 2016-06-29 NOTE — ED Notes (Signed)
Admitting doctor is at pt's bedside.

## 2016-06-29 NOTE — ED Notes (Signed)
Pt was came in today after being told by her internal medicine doctor to come. Pt stating that she has had blood in her stool over the weekend that was bright red. Pt stating that she started feeling bad yesterday. Pt does have hx of Colitis and states that she was concerned she might need a transfusion. Pt stating that she has had lightheadedness and dizziness that started mostly yesterday. Pt skin is pale at this time. Pt is aaox4 and in NAD at this time. Pt was placed on cardiac monitor and is running ST. Pt denying pain at this time.

## 2016-06-29 NOTE — ED Notes (Signed)
Pt up to restroom. Pt is ambulating well without assist.

## 2016-06-29 NOTE — ED Triage Notes (Signed)
Patient presents to ED via POV from Dr. Doy Hutching office. Patient c/o rectal bleeding x 2 weeks. Hx of needing blood transfusion. Patients color is pale.

## 2016-06-29 NOTE — ED Notes (Signed)
Nurse let pt use her phone and helped pt to restroom. Pt ambulating with out assist.

## 2016-06-29 NOTE — H&P (Signed)
[facility] [place] NAME: Regina Hood    MR#:  [id number]  DATE OF BIRTH:  03-10-1941  DATE OF ADMISSION:  06/29/2016  PRIMARY CARE PHYSICIAN: SPARKS,JEFFREY D, MD   REQUESTING/REFERRING PHYSICIAN: Dr. Corky Downs  CHIEF COMPLAINT:   Chief Complaint  Patient presents with  . Rectal Bleeding    HISTORY OF PRESENT ILLNESS:  Regina Hood  is a 76 y.o. female with a known history of HTN, DM, COPD AV aspirin presents to the emergency room complaining of 2 weeks of rectal bleeding. Patient has noticed significant amount of blood in stool over the weekend. She feels dizzy. No abdominal pain or nausea or vomiting. She takes one Metamucil and milk of magnesia every evening and tends to have multiple bowel movements in the morning which causes her bleeding. The bleeding didn't stop over the day. Today she called Dr. Percell Boston office and was sent to the emergency room due to the bleeding. Here her hemoglobin has been found to be 6.5. Tachycardic. Patient was diagnosed with ulcerative colitis many years back and had bleeding at that point along with blood transfusions. Has not had a colonoscopy in many years. She has not seen GI Dr. Vira Agar in over an year. Takes baby aspirin daily.  PAST MEDICAL HISTORY:   Past Medical History:  Diagnosis Date  . Arthritis   . COPD (chronic obstructive pulmonary disease) ([place])   . Depression   . Diabetes mellitus   . Diverticulitis   . GERD (gastroesophageal reflux disease)   . GI bleed   . Headache(784.0)   . Hypertension   . Lupus   . Ulcerative colitis ([place])     PAST SURGICAL HISTORY:   Past Surgical History:  Procedure Laterality Date  . ABDOMINAL HYSTERECTOMY    . BREAST BIOPSY Right   . COLONOSCOPY    . ORIF HUMERUS FRACTURE  09/17/2011   Procedure: OPEN REDUCTION INTERNAL FIXATION (ORIF) PROXIMAL HUMERUS FRACTURE;  Surgeon: Augustin Schooling, MD;  Location: [facility];  Service: Orthopedics;  Laterality: Left;     SOCIAL HISTORY:   Social History  Substance Use Topics  . Smoking status: Former Smoker    Packs/day: 1.00    Years: 10.00    Types: Cigarettes  . Smokeless tobacco: Never Used     Comment: quit smoking in 2003  . Alcohol use No    FAMILY HISTORY:   Family History  Problem Relation Age of Onset  . Breast cancer Mother 63    DRUG ALLERGIES:   Allergies  Allergen Reactions  . Penicillamine Other (See Comments)    REVIEW OF SYSTEMS:   Review of Systems  Constitutional: Positive for malaise/fatigue. Negative for chills, fever and weight loss.  HENT: Negative for hearing loss and nosebleeds.   Eyes: Negative for blurred vision, double vision and pain.  Respiratory: Positive for cough. Negative for hemoptysis, sputum production, shortness of breath and wheezing.   Cardiovascular: Negative for chest pain, palpitations, orthopnea and leg swelling.  Gastrointestinal: Positive for blood in stool. Negative for abdominal pain, constipation, diarrhea, nausea and vomiting.  Genitourinary: Negative for dysuria and hematuria.  Musculoskeletal: Negative for back pain, falls and myalgias.  Skin: Negative for rash.  Neurological: Positive for dizziness and weakness. Negative for tremors, sensory change, speech change, focal weakness, seizures and headaches.  Endo/Heme/Allergies: Does not bruise/bleed easily.  Psychiatric/Behavioral: Negative for depression and memory loss. The patient is not nervous/anxious.     MEDICATIONS AT HOME:  Prior to Admission medications   Medication Sig Start Date End Date Taking? Authorizing Provider  acetaminophen (TYLENOL) 500 MG tablet Take 1,000 mg by mouth 3 (three) times daily.   Yes Historical Provider, MD  ADVAIR DISKUS 250-50 MCG/DOSE AEPB Inhale 1 puff into the lungs daily. 05/05/16  Yes Historical Provider, MD  amLODipine (NORVASC) 5 MG tablet Take 5 mg by mouth daily. 06/01/16  Yes Historical Provider, MD  estradiol (ESTRACE) 1 MG tablet  Take 1 mg by mouth daily. 06/16/16  Yes Historical Provider, MD  gabapentin (NEURONTIN) 100 MG capsule Take 100 mg by mouth 2 (two) times daily.   Yes Historical Provider, MD  hydroxychloroquine (PLAQUENIL) 200 MG tablet Take 200 mg by mouth 2 (two) times daily. 06/16/16  Yes Historical Provider, MD  metFORMIN (GLUCOPHAGE) 500 MG tablet Take 500 mg by mouth 2 (two) times daily with a meal.   Yes Historical Provider, MD  pantoprazole (PROTONIX) 40 MG tablet Take 40 mg by mouth daily.   Yes Historical Provider, MD  traMADol (ULTRAM) 50 MG tablet Take 25 mg by mouth 2 (two) times daily.    Yes Historical Provider, MD  venlafaxine XR (EFFEXOR-XR) 75 MG 24 hr capsule Take 225 mg by mouth daily.    Yes Historical Provider, MD  predniSONE (DELTASONE) 5 MG tablet 2 tablets X 10 days, 1 tablet X 10 days then stop Patient not taking: Reported on 06/29/2016 05/24/16   Flora Lipps, MD     VITAL SIGNS:  Blood pressure (!) 118/49, pulse (!) 108, temperature 98 F (36.7 C), temperature source Oral, resp. rate 16, height 5' 1"  (1.549 m), weight 79.8 kg (176 lb), SpO2 95 %.  PHYSICAL EXAMINATION:  Physical Exam  GENERAL:  76 y.o.-year-old patient lying in the bed with no acute distress. Looks pale EYES: Pupils equal, round, reactive to light and accommodation. No scleral icterus. Extraocular muscles intact.  HEENT: Head atraumatic, normocephalic. Oropharynx and nasopharynx clear. No oropharyngeal erythema, moist oral mucosa  NECK:  Supple, no jugular venous distention. No thyroid enlargement, no tenderness.  LUNGS: Normal breath sounds bilaterally, no wheezing, rales, rhonchi. No use of accessory muscles of respiration.  CARDIOVASCULAR: S1, S2 normal. No murmurs, rubs, or gallops. Tachycardia ABDOMEN: Soft, nontender, nondistended. Bowel sounds present. No organomegaly or mass.  EXTREMITIES: No pedal edema, cyanosis, or clubbing. + 2 pedal & radial pulses b/l.   NEUROLOGIC: Cranial nerves II through XII are  intact. No focal Motor or sensory deficits appreciated b/l PSYCHIATRIC: The patient is alert and oriented x 3. Good affect.  SKIN: No obvious rash, lesion, or ulcer.   LABORATORY PANEL:   CBC  Recent Labs Lab 06/29/16 1401  WBC 11.8*  HGB 6.5*  HCT 20.4*  PLT 325   ------------------------------------------------------------------------------------------------------------------  Chemistries   Recent Labs Lab 06/29/16 1401  NA 134*  K 4.0  CL 101  CO2 28  GLUCOSE 224*  BUN 16  CREATININE 0.81  CALCIUM 8.6*  AST 45*  ALT 61*  ALKPHOS 153*  BILITOT 0.4   ------------------------------------------------------------------------------------------------------------------  Cardiac Enzymes No results for input(s): TROPONINI in the last 168 hours. ------------------------------------------------------------------------------------------------------------------  RADIOLOGY:  No results found.   IMPRESSION AND PLAN:   * Bright red blood per rectum with acute blood loss anemia Hemoglobin 6.5. Transfuse 2 units packed RBC. Check hemoglobin after this. Repeat hemoglobin in the morning. Without any abdominal pain or tenderness ulcerative colitis as cause of bleeding seems unlikely. Possibly diverticular. Consult GI. Place on clear liquids at this time.  Colonoscopy as per GI. Hold aspirin. No Lovenox or heparin.  * Hypertension Continue patient's amlodipine.  * Diabetes mellitus Continue metformin. Sliding scale insulin.  * COPD. Stable. Continue home inhalers and nebulizers as needed.  * DVT prophylaxis with SCDs  All the records are reviewed and case discussed with ED provider. Management plans discussed with the patient, family and they are in agreement.  CODE STATUS: DNR  TOTAL TIME TAKING CARE OF THIS PATIENT: 40 minutes.   Hillary Bow R M.D on 06/29/2016 at 3:35 PM  Between 7am to 6pm - Pager - [phone]  After 6pm go to [email or web address] - password  [facility]  Office  [phone]  CC: Primary care physician; Idelle Crouch, MD  Note: This dictation was prepared with Dragon dictation along with smaller phrase technology. Any transcriptional errors that result from this process are unintentional.

## 2016-06-29 NOTE — ED Notes (Signed)
Pt given lunch tray. Pt in NAD at this time and is eating with no difficulty.

## 2016-06-29 NOTE — ED Notes (Signed)
Nurse called and spoke to [place], CN about pt's bed status.

## 2016-06-29 NOTE — Progress Notes (Signed)
Advance care planning  Patient lives alone. Her healthcare power of attorney is her daughter. Discussed regarding CODE STATUS with patient. She does not have any documented advance directives. But patient mentions that she does not want to be placed on a ventilator or be resuscitated. Discussed regarding DO NOT RESUSCITATE and she prefers to be DO NOT RESUSCITATE. Orders entered.  Discussed regarding patient's treatment plan and prognosis.  Time spent 18 minutes

## 2016-06-30 ENCOUNTER — Telehealth: Payer: Self-pay | Admitting: *Deleted

## 2016-06-30 ENCOUNTER — Inpatient Hospital Stay: Payer: PPO

## 2016-06-30 DIAGNOSIS — K625 Hemorrhage of anus and rectum: Secondary | ICD-10-CM

## 2016-06-30 DIAGNOSIS — R7989 Other specified abnormal findings of blood chemistry: Secondary | ICD-10-CM

## 2016-06-30 LAB — BASIC METABOLIC PANEL
Anion gap: 7 (ref 5–15)
BUN: 11 mg/dL (ref 6–20)
CHLORIDE: 103 mmol/L (ref 101–111)
CO2: 27 mmol/L (ref 22–32)
CREATININE: 0.64 mg/dL (ref 0.44–1.00)
Calcium: 8.5 mg/dL — ABNORMAL LOW (ref 8.9–10.3)
GFR calc non Af Amer: >60 mL/min (ref >60)
GLUCOSE: 140 mg/dL — AB (ref 65–99)
Potassium: 3.9 mmol/L (ref 3.5–5.1)
SODIUM: 137 mmol/L (ref 135–145)

## 2016-06-30 LAB — HEMOGLOBIN A1C
Hgb A1c MFr Bld: 7 % — ABNORMAL HIGH (ref 4.8–5.6)
Mean Plasma Glucose: 154 mg/dL

## 2016-06-30 LAB — C-REACTIVE PROTEIN

## 2016-06-30 LAB — CBC
HEMATOCRIT: 24.4 % — AB (ref 35.0–47.0)
Hemoglobin: 8.1 g/dL — ABNORMAL LOW (ref 12.0–16.0)
MCH: 27.1 pg (ref 26.0–34.0)
MCHC: 33.1 g/dL (ref 32.0–36.0)
MCV: 81.9 fL (ref 80.0–100.0)
PLATELETS: 261 10*3/uL (ref 150–440)
RBC: 2.98 MIL/uL — ABNORMAL LOW (ref 3.80–5.20)
RDW: 17 % — AB (ref 11.5–14.5)
WBC: 10.1 10*3/uL (ref 3.6–11.0)

## 2016-06-30 LAB — GLUCOSE, CAPILLARY
GLUCOSE-CAPILLARY: 120 mg/dL — AB (ref 65–99)
Glucose-Capillary: 118 mg/dL — ABNORMAL HIGH (ref 65–99)
Glucose-Capillary: 110 mg/dL — ABNORMAL HIGH (ref 65–99)
Glucose-Capillary: 122 mg/dL — ABNORMAL HIGH (ref 65–99)

## 2016-06-30 LAB — GAMMA GT: GGT: 257 U/L — AB (ref 7–50)

## 2016-06-30 LAB — IRON AND TIBC
Iron: 35 ug/dL (ref 28–170)
SATURATION RATIOS: 8 % — AB (ref 10.4–31.8)
TIBC: 459 ug/dL — AB (ref 250–450)
UIBC: 424 ug/dL

## 2016-06-30 LAB — HEMOGLOBIN: Hemoglobin: 8.7 g/dL — ABNORMAL LOW (ref 12.0–16.0)

## 2016-06-30 LAB — TSH: TSH: 3.356 u[IU]/mL (ref 0.350–4.500)

## 2016-06-30 LAB — FERRITIN: Ferritin: 9 ng/mL — ABNORMAL LOW (ref 11–307)

## 2016-06-30 LAB — FOLATE: FOLATE: 32 ng/mL (ref >5.9)

## 2016-06-30 LAB — VITAMIN B12: VITAMIN B 12: 968 pg/mL — AB (ref 180–914)

## 2016-06-30 MED ORDER — ZOLPIDEM TARTRATE 5 MG PO TABS
5.0000 mg | ORAL_TABLET | Freq: Every evening | ORAL | Status: DC | PRN
  Administered 2016-06-30 (×2): 5 mg via ORAL
  Filled 2016-06-30 (×2): qty 1

## 2016-06-30 NOTE — Progress Notes (Signed)
[facility] [place] NAME: Regina Hood    MR#:  [id number]  DATE OF BIRTH:  [birth year]  SUBJECTIVE:  CHIEF COMPLAINT:   Chief Complaint  Patient presents with  . Rectal Bleeding   No complaints, no active rectal bleeding. REVIEW OF SYSTEMS:  Review of Systems  Constitutional: Negative for chills, fever and malaise/fatigue.  HENT: Negative for congestion.   Eyes: Negative for blurred vision and double vision.  Respiratory: Negative for cough, shortness of breath and stridor.   Cardiovascular: Negative for chest pain and leg swelling.  Gastrointestinal: Negative for abdominal pain, blood in stool, constipation, diarrhea, melena, nausea and vomiting.  Genitourinary: Negative for dysuria and hematuria.  Musculoskeletal: Negative for joint pain.  Neurological: Negative for dizziness, focal weakness, loss of consciousness and weakness.  Psychiatric/Behavioral: Negative for depression. The patient is not nervous/anxious.     DRUG ALLERGIES:   Allergies  Allergen Reactions  . Penicillamine Other (See Comments)   VITALS:  Blood pressure 114/77, pulse (!) 105, temperature 98.9 F (37.2 C), temperature source Oral, resp. rate 18, height 5' 1"  (1.549 m), weight 168 lb (76.2 kg), SpO2 96 %. PHYSICAL EXAMINATION:  Physical Exam  Constitutional: She is oriented to person, place, and time and well-developed, well-nourished, and in no distress.  HENT:  Mouth/Throat: Oropharynx is clear and moist.  Eyes: Conjunctivae and EOM are normal. No scleral icterus.  Neck: Normal range of motion. Neck supple. No JVD present. No tracheal deviation present.  Cardiovascular: Normal rate, regular rhythm and normal heart sounds.  Exam reveals no gallop.   No murmur heard. Pulmonary/Chest: Effort normal and breath sounds normal. No respiratory distress. She has no wheezes. She has no rales.  Abdominal: Soft. Bowel sounds are normal. She exhibits no distension.  There is no tenderness.  Musculoskeletal: Normal range of motion. She exhibits no edema or tenderness.  Neurological: She is alert and oriented to person, place, and time. No cranial nerve deficit.  Skin: No rash noted. No erythema.  Psychiatric: Affect normal.   LABORATORY PANEL:  Female CBC  Recent Labs Lab 06/30/16 0435 06/30/16 0730  WBC 10.1  --   HGB 8.1* 8.7*  HCT 24.4*  --   PLT 261  --    ------------------------------------------------------------------------------------------------------------------ Chemistries   Recent Labs Lab 06/29/16 1401 06/30/16 0435  NA 134* 137  K 4.0 3.9  CL 101 103  CO2 28 27  GLUCOSE 224* 140*  BUN 16 11  CREATININE 0.81 0.64  CALCIUM 8.6* 8.5*  AST 45*  --   ALT 61*  --   ALKPHOS 153*  --   BILITOT 0.4  --    RADIOLOGY:  US Abdomen Limited Ruq  Result Date: 06/30/2016 CLINICAL DATA:  Abnormal LFTs EXAM: US ABDOMEN LIMITED - RIGHT UPPER QUADRANT COMPARISON:  None. FINDINGS: Gallbladder: Decompressed without evidence of cholelithiasis or pericholecystic fluid. Common bile duct: Diameter: 1.8 mm Liver: No focal lesion identified. Within normal limits in parenchymal echogenicity. IMPRESSION: Decompressed gallbladder.  No acute abnormality noted. Electronically Signed   By: Inez Catalina M.D.   On: 06/30/2016 09:38   ASSESSMENT AND PLAN:   * Bright red blood per rectum with acute blood loss anemia Hemoglobin was 6.5. Transfused 2 units packed RBC.  Hb 8.7. Hold aspirin. No Lovenox or heparin. Dr. Vicente Males, strongly suggest a colonoscopy tomorrow and if negative will proceed with EGD at the same time . She is not interested to have the procedure as in  patient. If she changes her mind please call me otherwise her options are to follow up with Dr Tiffany Kocher ASAP to have her CBC monitored and for endoscopy to be scheduled. Monitor CBC and transfuse as needed.  The patient doesn't want to get a colonoscopy tomorrow. She wants to get it as  outpatient.  * Ulcerative colitis. Per Dr. Vicente Males,  Check CRP, fecal lactoferrin for baseline. IF CRP is elevated along with lactoferrin will empirically treat with Lialda 4 tablets a day 4.8 grams in total while she awaits endoscopy  * Hypertension Continue patient's amlodipine.  * Diabetes mellitus Continue metformin. Sliding scale insulin.  * COPD. Stable. Continue home inhalers and nebulizers as needed.  All the records are reviewed and case discussed with Care Management/Social Worker. Management plans discussed with the patient, family and they are in agreement.  CODE STATUS: DNR  TOTAL TIME TAKING CARE OF THIS PATIENT: 33 minutes.   More than 50% of the time was spent in counseling/coordination of care: YES  POSSIBLE D/C IN 1 DAYS, DEPENDING ON CLINICAL CONDITION.   Demetrios Loll M.D on 06/30/2016 at 4:22 PM  Between 7am to 6pm - Pager - [phone]  After 6pm go to [email or web address] - Proofreader  [facility]  Office  [phone]  CC: Primary care physician; Idelle Crouch, MD  Note: This dictation was prepared with Dragon dictation along with smaller phrase technology. Any transcriptional errors that result from this process are unintentional.

## 2016-06-30 NOTE — Discharge Planning (Signed)
Dr. Cloyde Reams office and spoke with Rojelio Brenner, RN per patient's request.  Informed her of patient's SPO2 levels in hospital for previous 24 hours and no recorded need for O2 during that time.  Misty, indicated that per patient's records, she abulated 2 weeks ago and office and de-sated below 90's, and was placed on O2 for activity.  She further indicated that patient's records do not indicate the need for O2 at night.  Patient updated on what Outpatient Pulmonary RN stated and Misty agreed they would re-address O2 need at next follow up visit.

## 2016-06-30 NOTE — Telephone Encounter (Signed)
Nurse called from [facility] to inform you that our pt had been admitted for a GI bleed and had told the unit that she uses O2 QHS. I informed the nurse that we had pt O2 ordered for 2L with exertion per [facility]  On 06/03/16. He states pt sats have stay elevated in the 90s even with exertion. The nurse ask that we address the O2 issue when pt f/u in office.   FYI

## 2016-06-30 NOTE — Plan of Care (Signed)
Stool Occult ordered, but patient has not had stool in hospital.  Will continue to monitor and send to lab as possible.

## 2016-06-30 NOTE — Plan of Care (Signed)
Problem: Education: Goal: Knowledge of [facility] Education information/materials will improve Outcome: Progressing VSS, free of falls during shift.  Reported HA pain 2-4/10, received scheduled PO tramadol 43m, PRN PO acetaminophen [id number].  Improved to 1/10, no add'l interventions needed.  Requested sleep aid, Dr. DiMarcille Blancoaged, received ordered PO zolpidem 59m26mpt asleep now.  No other needs overnight.  Receiving 2nd unit PRBC, recheck Hgb ordered @ 0700.  Bed in low position, call bell within reach.  WCTM.

## 2016-06-30 NOTE — Telephone Encounter (Signed)
LM with son asking him to have pt to call me back in regards to pt's ONO results.

## 2016-06-30 NOTE — Plan of Care (Addendum)
Patient continues to remain on RA and SPO2 levels remain 95% or above day and night.  Patient indicates that she does have O2 at the house and has been instructed to use all night and with activity.  Patient does get winded and SOB with activity (SPO2 low 90s), but body rebounds in less than 5 minutes with rest.  RN educated patient that O2 is a medication and over usage could cause a body to become dependant over an extended period of time.  Patient suggested to re-evaluate O2 usage with her outpatient pulmonologist and maybe consider an emergency inhaler (rather than using O2 with moderate activity).  Patient agreed and voiced understanding.

## 2016-06-30 NOTE — Consult Note (Signed)
Jonathon Bellows MD  [address] [place], [place] [address] Phone: [phone] Fax : [phone]  Consultation  Referring Provider:   Dr Darvin Neighbours Primary Care Physician:  Idelle Crouch, MD Primary Gastroenterologist:  Dr. Tiffany Kocher         Reason for Consultation:     Rectal bleeding  Date of Admission:  06/29/2016 Date of Consultation:  06/30/2016         HPI:   Regina Hood is a 76 y.o. female presented to the hospital yesterday with a 2 week history of rectal bleeding . She is a patient of Dr Tiffany Kocher and he last saw her back in 10/2014 , she has a history of pan ulcerative colitis, per last note was not on any medication for her colitis . Based on his last note was clinically in remission at that time and was due for a colonoscopy in 2017 . I also note that she was being treated for iron deficiency anemia with oral iron.   On admission had a Hb of 6.5 and a MCV of 81 05/2016 had a HB of 10.3 and MCV 84. Iron was borderline low, high TIBC and low iron % saturation of 6 . Small qty blood in urine seen.   She says she was diagnosed with ulcerative colitis "many many many years back" , been for a brief time on Asacol but not been on medications for many years.   She says last 2 weeks having 2-3 episodes of dark bloody bowel movements a day , some abdominal cramping . Denies any NSAID use. Last bowel movement was yesterday and had some blood in it.   Past Medical History:  Diagnosis Date  . Arthritis   . COPD (chronic obstructive pulmonary disease) ([place])   . Depression   . Diabetes mellitus   . Diverticulitis   . GERD (gastroesophageal reflux disease)   . GI bleed   . Headache(784.0)   . Hypertension   . Lupus   . Ulcerative colitis [facility])     Past Surgical History:  Procedure Laterality Date  . ABDOMINAL HYSTERECTOMY    . BREAST BIOPSY Right   . COLONOSCOPY    . ORIF HUMERUS FRACTURE  09/17/2011   Procedure: OPEN REDUCTION INTERNAL FIXATION (ORIF) PROXIMAL HUMERUS FRACTURE;  Surgeon:  Augustin Schooling, MD;  Location: [facility];  Service: Orthopedics;  Laterality: Left;    Prior to Admission medications   Medication Sig Start Date End Date Taking? Authorizing Provider  acetaminophen (TYLENOL) 500 MG tablet Take 1,000 mg by mouth 3 (three) times daily.   Yes Historical Provider, MD  ADVAIR DISKUS 250-50 MCG/DOSE AEPB Inhale 1 puff into the lungs daily. 05/05/16  Yes Historical Provider, MD  amLODipine (NORVASC) 5 MG tablet Take 5 mg by mouth daily. 06/01/16  Yes Historical Provider, MD  estradiol (ESTRACE) 1 MG tablet Take 1 mg by mouth daily. 06/16/16  Yes Historical Provider, MD  gabapentin (NEURONTIN) 100 MG capsule Take 100 mg by mouth 2 (two) times daily.   Yes Historical Provider, MD  hydroxychloroquine (PLAQUENIL) 200 MG tablet Take 200 mg by mouth 2 (two) times daily. 06/16/16  Yes Historical Provider, MD  metFORMIN (GLUCOPHAGE) 500 MG tablet Take 500 mg by mouth 2 (two) times daily with a meal.   Yes Historical Provider, MD  pantoprazole (PROTONIX) 40 MG tablet Take 40 mg by mouth daily.   Yes Historical Provider, MD  traMADol (ULTRAM) 50 MG tablet Take 25 mg by mouth 2 (two) times daily.  Yes Historical Provider, MD  venlafaxine XR (EFFEXOR-XR) 75 MG 24 hr capsule Take 225 mg by mouth daily.    Yes Historical Provider, MD  predniSONE (DELTASONE) 5 MG tablet 2 tablets X 10 days, 1 tablet X 10 days then stop Patient not taking: Reported on 06/29/2016 05/24/16   Flora Lipps, MD    Family History  Problem Relation Age of Onset  . Breast cancer Mother 60     Social History  Substance Use Topics  . Smoking status: Former Smoker    Packs/day: 1.00    Years: 10.00    Types: Cigarettes  . Smokeless tobacco: Never Used     Comment: quit smoking in 2003  . Alcohol use No    Allergies as of 06/29/2016 - Review Complete 06/29/2016  Allergen Reaction Noted  . Penicillamine Other (See Comments) 10/03/2013    Review of Systems:    All systems reviewed and negative except  where noted in HPI.   Physical Exam:  Vital signs in last 24 hours: Temp:  [98 F (36.7 C)-98.9 F (37.2 C)] 98 F (36.7 C) (03/14 0555) Pulse Rate:  [88-108] 97 (03/14 0555) Resp:  [14-24] 18 (03/14 0555) BP: (118-153)/(33-73) 153/73 (03/14 0555) SpO2:  [93 %-98 %] 93 % (03/14 0555) Weight:  [168 lb (76.2 kg)-176 lb (79.8 kg)] 168 lb (76.2 kg) (03/14 0522) Last BM Date: 06/29/16 General:   Pleasant, cooperative in NAD Head:  Normocephalic and atraumatic. Eyes:   No icterus.   Conjunctiva pink. PERRLA. Ears:  Normal auditory acuity. Neck:  Supple; no masses or thyroidomegaly Lungs: Respirations even and unlabored. Lungs clear to auscultation bilaterally.   No wheezes, crackles, or rhonchi.  Heart:  Regular rate and rhythm;  Without murmur, clicks, rubs or gallops Abdomen:  Soft, nondistended, nontender. Normal bowel sounds. No appreciable masses or hepatomegaly.  No rebound or guarding.  Rectal:  Not performed. Extremities:  Without edema, cyanosis or clubbing. Neurologic:  Alert and oriented x3;  grossly normal neurologically. Psych:  Alert and cooperative. Normal affect.  LAB RESULTS:  Recent Labs  06/29/16 1401 06/29/16 2052 06/30/16 0435 06/30/16 0730  WBC 11.8*  --  10.1  --   HGB 6.5* 5.9* 8.1* 8.7*  HCT 20.4*  --  24.4*  --   PLT 325  --  261  --    BMET  Recent Labs  06/29/16 1401 06/30/16 0435  NA 134* 137  K 4.0 3.9  CL 101 103  CO2 28 27  GLUCOSE 224* 140*  BUN 16 11  CREATININE 0.81 0.64  CALCIUM 8.6* 8.5*   LFT  Recent Labs  06/29/16 1401  PROT 6.7  ALBUMIN 3.4*  AST 45*  ALT 61*  ALKPHOS 153*  BILITOT 0.4   PT/INR No results for input(s): LABPROT, INR in the last 72 hours.  STUDIES: No results found.    Impression / Plan:   Regina Hood is a 76 y.o. y/o female with h/o ulcerative colitis who follows with Dr Tiffany Kocher last seen in 2016 when she was in remission clinically presents to the hospital with two weeks history of rectal  bleeding.   Present issues:    1. Acute on chronic microcytic anemia : likely a combination of anemia of chronic disease and iron deficiency anemia. Check iron studies,b12,folate,TSH to get baseline and would benefit from a dose of IV iron .   2. Rectal bleeding: ?related to ulcerative colitis . I strongly suggest a colonoscopy tomorrow and if negative will  proceed with EGD at the same time . She is not interested to have the procedure as in patient. If she changes her mind please call me otherwise her options are to follow up with Dr Tiffany Kocher ASAP to have her CBC monitored and for endoscopy to be scheduled. Monitor CBC and transfuse as needed  3. Ulcerative colitis: Check CRP, fecal lactoferrin for baseline. IF CRP is elevated along with lactoferrin will empirically treat with Lialda 4 tablets a day 4.8 grams in total while she awaits endoscopy  4.  Abnormal LFT's ;elevated alk phos AST,ALT(normal in 03/2016) , get USG RUQ, GGT   Thank you for involving me in the care of this patient.      LOS: 1 day   Jonathon Bellows, MD  06/30/2016, 8:25 AM

## 2016-07-01 LAB — HEMOGLOBIN: Hemoglobin: 9 g/dL — ABNORMAL LOW (ref 12.0–16.0)

## 2016-07-01 LAB — TYPE AND SCREEN
ABO/RH(D): O POS
ANTIBODY SCREEN: NEGATIVE
UNIT DIVISION: 0
Unit division: 0

## 2016-07-01 LAB — BPAM RBC
Blood Product Expiration Date: 201804022359
Blood Product Expiration Date: 201804032359
ISSUE DATE / TIME: 201803140301
ISSUE DATE / TIME: 201803140000
UNIT TYPE AND RH: 5100
Unit Type and Rh: 5100

## 2016-07-01 LAB — GLUCOSE, CAPILLARY
GLUCOSE-CAPILLARY: 105 mg/dL — AB (ref 65–99)
Glucose-Capillary: 139 mg/dL — ABNORMAL HIGH (ref 65–99)

## 2016-07-01 MED ORDER — IRON SUCROSE 20 MG/ML IV SOLN
400.0000 mg | INTRAVENOUS | Status: DC
  Filled 2016-07-01: qty 20

## 2016-07-01 NOTE — Progress Notes (Signed)
Pt was discharged today. Discharge instructions were reviewed with the patient. She verified understanding. 0 paper prescriptions were given to patient. All belongings packed and returned to patient. She refused a wheelchair, patient walked out to the visitors entrance with staff.

## 2016-07-01 NOTE — Discharge Instructions (Signed)
Stop aspirin  Gastrointestinal Bleeding Gastrointestinal bleeding is bleeding somewhere along the path food travels through the body (digestive tract). This path is anywhere between the mouth and the opening of the butt (anus). You may have blood in your poop (stools) or have black poop. If you throw up (vomit), there may be blood in it. This condition can be mild, serious, or even life-threatening. If you have a lot of bleeding, you may need to stay in the hospital. Follow these instructions at home:  Take over-the-counter and prescription medicines only as told by your doctor.  Eat foods that have a lot of fiber in them. These foods include whole grains, fruits, and vegetables. You can also try eating 1-3 prunes each day.  Drink enough fluid to keep your pee (urine) clear or pale yellow.  Keep all follow-up visits as told by your doctor. This is important. Contact a doctor if:  Your symptoms do not get better. Get help right away if:  Your bleeding gets worse.  You feel dizzy or you pass out (faint).  You feel weak.  You have very bad cramps in your back or belly (abdomen).  You pass large clumps of blood (clots) in your poop.  Your symptoms are getting worse. This information is not intended to replace advice given to you by your health care provider. Make sure you discuss any questions you have with your health care provider. Document Released: 01/13/2008 Document Revised: 09/11/2015 Document Reviewed: 09/23/2014 Elsevier Interactive Patient Education  2017 [employer].

## 2016-07-01 NOTE — Discharge Summary (Signed)
[facility] [place] NAME: Regina Hood    MR#:  [id number]  DATE OF BIRTH:  11/01/1940  DATE OF ADMISSION:  06/29/2016 ADMITTING PHYSICIAN: Hillary Bow, MD  DATE OF DISCHARGE: 07/01/2016  PRIMARY CARE PHYSICIAN: SPARKS,JEFFREY D, MD    ADMISSION DIAGNOSIS:  Acute GI bleeding [K92.2]  DISCHARGE DIAGNOSIS:  Active Problems:   Rectal bleeding   SECONDARY DIAGNOSIS:   Past Medical History:  Diagnosis Date  . Arthritis   . COPD (chronic obstructive pulmonary disease) ([place])   . Depression   . Diabetes mellitus   . Diverticulitis   . GERD (gastroesophageal reflux disease)   . GI bleed   . Headache(784.0)   . Hypertension   . Lupus   . Ulcerative colitis ([place])     HOSPITAL COURSE:   1. Rectal bleeding with bright red blood per rectum and acute on chronic blood loss anemia. The patient's initial hemoglobin was 6.5 and then went down to 5.9. Hemoglobin upon discharge 9.0. The patient does have a history of ulcerative colitis but the patient's CRP was less than 0.8. The patient does take aspirin at home. This could have been a diverticular bleed. I advised her to stop aspirin. She refused any procedures here in the hospital. She was seen in consultation by Dr. Vicente Males gastroenterology. The patient states she will follow-up with Dr. Vira Agar as outpatient for procedures.. I did give her 400 mg of IV Venofer prior to discharge. Recommend following up hemoglobin as outpatient in follow-up appointment. Okay to go back on slow iron as outpatient 2. COPD. Respiratory status stable continue inhalers 3. Essential hypertension continue Norvasc 4. Type 2 diabetes controlled back on Glucophage 5. Depression continue psychiatric medications 6. History of lupus on Plaquenil  DISCHARGE CONDITIONS:   Satisfactory  CONSULTS OBTAINED:   gastroenterology Dr. Vicente Males  DRUG ALLERGIES:   Allergies  Allergen Reactions  . Penicillamine Other (See Comments)     DISCHARGE MEDICATIONS:   Current Discharge Medication List    CONTINUE these medications which have NOT CHANGED   Details  acetaminophen (TYLENOL) 500 MG tablet Take 1,000 mg by mouth 3 (three) times daily.    ADVAIR DISKUS 250-50 MCG/DOSE AEPB Inhale 1 puff into the lungs daily.    amLODipine (NORVASC) 5 MG tablet Take 5 mg by mouth daily.    estradiol (ESTRACE) 1 MG tablet Take 1 mg by mouth daily.    gabapentin (NEURONTIN) 100 MG capsule Take 100 mg by mouth 2 (two) times daily.    hydroxychloroquine (PLAQUENIL) 200 MG tablet Take 200 mg by mouth 2 (two) times daily.    metFORMIN (GLUCOPHAGE) 500 MG tablet Take 500 mg by mouth 2 (two) times daily with a meal.    pantoprazole (PROTONIX) 40 MG tablet Take 40 mg by mouth daily.    traMADol (ULTRAM) 50 MG tablet Take 25 mg by mouth 2 (two) times daily.     venlafaxine XR (EFFEXOR-XR) 75 MG 24 hr capsule Take 225 mg by mouth daily.       STOP taking these medications     predniSONE (DELTASONE) 5 MG tablet          DISCHARGE INSTRUCTIONS:   Follow-up with PMD one week Follow-up gastroenterology 2 weeks  If you experience worsening of your admission symptoms, develop shortness of breath, life threatening emergency, suicidal or homicidal thoughts you must seek medical attention immediately by calling 911 or calling your MD immediately  if symptoms less severe.  You Must read complete instructions/literature along with all the possible adverse reactions/side effects for all the Medicines you take and that have been prescribed to you. Take any new Medicines after you have completely understood and accept all the possible adverse reactions/side effects.   Please note  You were cared for by a hospitalist during your hospital stay. If you have any questions about your discharge medications or the care you received while you were in the hospital after you are discharged, you can call the unit and asked to speak with the  hospitalist on call if the hospitalist that took care of you is not available. Once you are discharged, your primary care physician will handle any further medical issues. Please note that NO REFILLS for any discharge medications will be authorized once you are discharged, as it is imperative that you return to your primary care physician (or establish a relationship with a primary care physician if you do not have one) for your aftercare needs so that they can reassess your need for medications and monitor your lab values.    Today   CHIEF COMPLAINT:   Chief Complaint  Patient presents with  . Rectal Bleeding    HISTORY OF PRESENT ILLNESS:  Regina Hood  is a 76 y.o. female presented with rectal bleeding and found to have a hemoglobin of 6.5   VITAL SIGNS:  Blood pressure (!) 130/57, pulse 95, temperature 98.3 F (36.8 C), temperature source Oral, resp. rate 16, height 5' 1"  (1.549 m), weight 73.6 kg (162 lb 3.2 oz), SpO2 99 %.    PHYSICAL EXAMINATION:  GENERAL:  76 y.o.-year-old patient lying in the bed with no acute distress.  EYES: Pupils equal, round, reactive to light and accommodation. No scleral icterus. Extraocular muscles intact.  HEENT: Head atraumatic, normocephalic. Oropharynx and nasopharynx clear.  NECK:  Supple, no jugular venous distention. No thyroid enlargement, no tenderness.  LUNGS: Normal breath sounds bilaterally, no wheezing, rales,rhonchi or crepitation. No use of accessory muscles of respiration.  CARDIOVASCULAR: S1, S2 normal. No murmurs, rubs, or gallops.  ABDOMEN: Soft, non-tender, non-distended. Bowel sounds present. No organomegaly or mass.  EXTREMITIES: No pedal edema, cyanosis, or clubbing.  NEUROLOGIC: Cranial nerves II through XII are intact. Muscle strength 5/5 in all extremities. Sensation intact. Gait not checked.  PSYCHIATRIC: The patient is alert and oriented x 3.  SKIN: No obvious rash, lesion, or ulcer.   DATA REVIEW:   CBC  Recent  Labs Lab 06/30/16 0435  07/01/16 0606  WBC 10.1  --   --   HGB 8.1*  < > 9.0*  HCT 24.4*  --   --   PLT 261  --   --   < > = values in this interval not displayed.  Chemistries   Recent Labs Lab 06/29/16 1401 06/30/16 0435  NA 134* 137  K 4.0 3.9  CL 101 103  CO2 28 27  GLUCOSE 224* 140*  BUN 16 11  CREATININE 0.81 0.64  CALCIUM 8.6* 8.5*  AST 45*  --   ALT 61*  --   ALKPHOS 153*  --   BILITOT 0.4  --      Microbiology Results  Results for orders placed or performed during the hospital encounter of 09/17/11  Surgical pcr screen     Status: None   Collection Time: 09/17/11  8:05 AM  Result Value Ref Range Status   MRSA, PCR NEGATIVE NEGATIVE Final   Staphylococcus aureus NEGATIVE NEGATIVE Final  Comment:        The Xpert SA Assay (FDA approved for NASAL specimens only), is one component of a comprehensive surveillance program.  It is not intended to diagnose infection nor to guide or monitor treatment.    RADIOLOGY:  US Abdomen Limited Ruq  Result Date: 06/30/2016 CLINICAL DATA:  Abnormal LFTs EXAM: US ABDOMEN LIMITED - RIGHT UPPER QUADRANT COMPARISON:  None. FINDINGS: Gallbladder: Decompressed without evidence of cholelithiasis or pericholecystic fluid. Common bile duct: Diameter: 1.8 mm Liver: No focal lesion identified. Within normal limits in parenchymal echogenicity. IMPRESSION: Decompressed gallbladder.  No acute abnormality noted. Electronically Signed   By: Inez Catalina M.D.   On: 06/30/2016 09:38     Management plans discussed with the patient, and she is in agreement.  CODE STATUS:     Code Status Orders        Start     Ordered   06/29/16 1532  Do not attempt resuscitation (DNR)  Continuous    Question Answer Comment  In the event of cardiac or respiratory ARREST Do not call a "code blue"   In the event of cardiac or respiratory ARREST Do not perform Intubation, CPR, defibrillation or ACLS   In the event of cardiac or respiratory ARREST  Use medication by any route, position, wound care, and other measures to relive pain and suffering. May use oxygen, suction and manual treatment of airway obstruction as needed for comfort.      06/29/16 1533    Code Status History    Date Active Date Inactive Code Status Order ID Comments User Context   09/17/2011  2:43 PM 09/19/2011  4:24 PM Full Code [id number]  Cristi Loron, RN Inpatient      TOTAL TIME TAKING CARE OF THIS PATIENT: 35 minutes.    Loletha Grayer M.D on 07/01/2016 at 3:21 PM  Between 7am to 6pm - Pager - [phone]  After 6pm go to [email or web address] - password [email or web address]  [facility] Office  [phone]  CC: Primary care physician; Idelle Crouch, MD

## 2016-07-05 ENCOUNTER — Encounter: Payer: Self-pay | Admitting: *Deleted

## 2016-07-05 ENCOUNTER — Emergency Department
Admission: EM | Admit: 2016-07-05 | Discharge: 2016-07-05 | Disposition: A | Discharge status: Home / Self Care | Payer: PPO | Attending: Emergency Medicine | Admitting: Emergency Medicine

## 2016-07-05 DIAGNOSIS — Z7984 Long term (current) use of oral hypoglycemic drugs: Secondary | ICD-10-CM | POA: Diagnosis not present

## 2016-07-05 DIAGNOSIS — D649 Anemia, unspecified: Secondary | ICD-10-CM | POA: Insufficient documentation

## 2016-07-05 DIAGNOSIS — L03113 Cellulitis of right upper limb: Secondary | ICD-10-CM | POA: Diagnosis not present

## 2016-07-05 DIAGNOSIS — I10 Essential (primary) hypertension: Secondary | ICD-10-CM | POA: Insufficient documentation

## 2016-07-05 DIAGNOSIS — Z87891 Personal history of nicotine dependence: Secondary | ICD-10-CM | POA: Diagnosis not present

## 2016-07-05 DIAGNOSIS — E119 Type 2 diabetes mellitus without complications: Secondary | ICD-10-CM | POA: Diagnosis not present

## 2016-07-05 DIAGNOSIS — R7989 Other specified abnormal findings of blood chemistry: Secondary | ICD-10-CM | POA: Diagnosis not present

## 2016-07-05 DIAGNOSIS — J449 Chronic obstructive pulmonary disease, unspecified: Secondary | ICD-10-CM | POA: Diagnosis not present

## 2016-07-05 DIAGNOSIS — K5100 Ulcerative (chronic) pancolitis without complications: Secondary | ICD-10-CM | POA: Diagnosis not present

## 2016-07-05 DIAGNOSIS — Z8719 Personal history of other diseases of the digestive system: Secondary | ICD-10-CM | POA: Diagnosis not present

## 2016-07-05 DIAGNOSIS — D509 Iron deficiency anemia, unspecified: Secondary | ICD-10-CM | POA: Diagnosis not present

## 2016-07-05 LAB — COMPREHENSIVE METABOLIC PANEL
ALT: 31 U/L (ref 14–54)
AST: 31 U/L (ref 15–41)
Albumin: 3.5 g/dL (ref 3.5–5.0)
Alkaline Phosphatase: 123 U/L (ref 38–126)
Anion gap: 7 (ref 5–15)
BUN: 9 mg/dL (ref 6–20)
CHLORIDE: 100 mmol/L — AB (ref 101–111)
CO2: 29 mmol/L (ref 22–32)
Calcium: 8.9 mg/dL (ref 8.9–10.3)
Creatinine, Ser: 0.45 mg/dL (ref 0.44–1.00)
GFR calc Af Amer: >60 mL/min (ref >60)
Glucose, Bld: 125 mg/dL — ABNORMAL HIGH (ref 65–99)
POTASSIUM: 3.9 mmol/L (ref 3.5–5.1)
Sodium: 136 mmol/L (ref 135–145)
Total Bilirubin: 0.3 mg/dL (ref 0.3–1.2)
Total Protein: 7 g/dL (ref 6.5–8.1)

## 2016-07-05 LAB — CBC
HEMATOCRIT: 28 % — AB (ref 35.0–47.0)
Hemoglobin: 9.2 g/dL — ABNORMAL LOW (ref 12.0–16.0)
MCH: 28.2 pg (ref 26.0–34.0)
MCHC: 32.9 g/dL (ref 32.0–36.0)
MCV: 85.8 fL (ref 80.0–100.0)
Platelets: 313 10*3/uL (ref 150–440)
RBC: 3.26 MIL/uL — AB (ref 3.80–5.20)
RDW: 18.9 % — ABNORMAL HIGH (ref 11.5–14.5)
WBC: 11.7 10*3/uL — AB (ref 3.6–11.0)

## 2016-07-05 LAB — URINALYSIS, COMPLETE (UACMP) WITH MICROSCOPIC
BACTERIA UA: NONE SEEN
BILIRUBIN URINE: NEGATIVE
Glucose, UA: NEGATIVE mg/dL
Hgb urine dipstick: NEGATIVE
KETONES UR: NEGATIVE mg/dL
Nitrite: NEGATIVE
Protein, ur: 100 mg/dL — AB
Specific Gravity, Urine: 1.016 (ref 1.005–1.030)
pH: 5 (ref 5.0–8.0)

## 2016-07-05 MED ORDER — CEPHALEXIN 500 MG PO CAPS: 500.0000 mg | ORAL_CAPSULE | Freq: Three times a day (TID) | ORAL | 0 refills | Status: DC

## 2016-07-05 MED ORDER — CEPHALEXIN 500 MG PO CAPS
500.0000 mg | ORAL_CAPSULE | Freq: Once | ORAL | Status: AC
  Administered 2016-07-05: 500 mg via ORAL
  Filled 2016-07-05: qty 1

## 2016-07-05 NOTE — ED Provider Notes (Signed)
[facility] Emergency Department Provider Note  ____________________________________________  Time seen: Approximately 9:58 PM  I have reviewed the triage vital signs and the nursing notes.   HISTORY  Chief Complaint Abnormal Lab and Cellulitis    HPI Regina Hood is a 76 y.o. female who sent to the ED from primary care today due to cellulitis of the right arm as well as decrease in hemoglobin. Lab records from primary care show hemoglobin of 10.3 one month ago, 8.8 today. However, patient was recently hospitalized over the last week due to GI bleed. She had a hemoglobin as low as 5.9, received transfusion with increase of her hemoglobin to 9.  Patient denies any new abdominal pain nausea vomiting or changes in bowel movements. No black or bloody stool. Reports that she feels just fine other than pain in her right arm. She has follow-up scheduled with GI for colonoscopy in April.     Past Medical History:  Diagnosis Date  . Arthritis   . COPD (chronic obstructive pulmonary disease) ([place])   . Depression   . Diabetes mellitus   . Diverticulitis   . GERD (gastroesophageal reflux disease)   . GI bleed   . Headache(784.0)   . Hypertension   . Lupus   . Ulcerative colitis [facility])      Patient Active Problem List   Diagnosis Date Noted  . Rectal bleeding 06/29/2016  . Proximal humerus fracture, left, closed, initial encounter 09/17/2011     Past Surgical History:  Procedure Laterality Date  . ABDOMINAL HYSTERECTOMY    . BREAST BIOPSY Right   . COLONOSCOPY    . ORIF HUMERUS FRACTURE  09/17/2011   Procedure: OPEN REDUCTION INTERNAL FIXATION (ORIF) PROXIMAL HUMERUS FRACTURE;  Surgeon: Augustin Schooling, MD;  Location: [facility];  Service: Orthopedics;  Laterality: Left;     Prior to Admission medications   Medication Sig Start Date End Date Taking? Authorizing Provider  acetaminophen (TYLENOL) 500 MG tablet Take 1,000 mg by mouth 3 (three) times daily.     Historical Provider, MD  ADVAIR DISKUS 250-50 MCG/DOSE AEPB Inhale 1 puff into the lungs daily. 05/05/16   Historical Provider, MD  amLODipine (NORVASC) 5 MG tablet Take 5 mg by mouth daily. 06/01/16   Historical Provider, MD  cephALEXin (KEFLEX) 500 MG capsule Take 1 capsule (500 mg total) by mouth 3 (three) times daily. 07/05/16   Carrie Mew, MD  estradiol (ESTRACE) 1 MG tablet Take 1 mg by mouth daily. 06/16/16   Historical Provider, MD  gabapentin (NEURONTIN) 100 MG capsule Take 100 mg by mouth 2 (two) times daily.    Historical Provider, MD  hydroxychloroquine (PLAQUENIL) 200 MG tablet Take 200 mg by mouth 2 (two) times daily. 06/16/16   Historical Provider, MD  metFORMIN (GLUCOPHAGE) 500 MG tablet Take 500 mg by mouth 2 (two) times daily with a meal.    Historical Provider, MD  pantoprazole (PROTONIX) 40 MG tablet Take 40 mg by mouth daily.    Historical Provider, MD  traMADol (ULTRAM) 50 MG tablet Take 25 mg by mouth 2 (two) times daily.     Historical Provider, MD  venlafaxine XR (EFFEXOR-XR) 75 MG 24 hr capsule Take 225 mg by mouth daily.     Historical Provider, MD     Allergies Penicillamine   Family History  Problem Relation Age of Onset  . Breast cancer Mother 25    Social History Social History  Substance Use Topics  . Smoking status: Former  Smoker    Packs/day: 1.00    Years: 10.00    Types: Cigarettes  . Smokeless tobacco: Never Used     Comment: quit smoking in 2003  . Alcohol use No    Review of Systems  Constitutional:   No fever or chills.  ENT:   No sore throat. No rhinorrhea. Cardiovascular:   No chest pain. Respiratory:   No dyspnea or cough. Gastrointestinal:   Negative for abdominal pain, vomiting and diarrhea.  Genitourinary:   Negative for dysuria or difficulty urinating. Musculoskeletal:   Right arm pain and swelling with redness. Neurological:   Negative for headaches 10-point ROS otherwise  negative.  ____________________________________________   PHYSICAL EXAM:  VITAL SIGNS: ED Triage Vitals [07/05/16 1810]  Enc Vitals Group     BP 129/62     Pulse Rate (!) 106     Resp 18     Temp 99.5 F (37.5 C)     Temp Source Oral     SpO2 100 %     Weight 172 lb (78 kg)     Height 5' 1"  (1.549 m)     Head Circumference      Peak Flow      Pain Score 0     Pain Loc      Pain Edu?      Excl. in [place]?     Vital signs reviewed, nursing assessments reviewed.   Constitutional:   Alert and oriented. Well appearing and in no distress. Eyes:   No scleral icterus. No conjunctival pallor. PERRL. EOMI.  No nystagmus. ENT   Head:   Normocephalic and atraumatic.   Nose:   No congestion/rhinnorhea. No septal hematoma   Mouth/Throat:   MMM, no pharyngeal erythema. No peritonsillar mass.    Neck:   No stridor. No SubQ emphysema. No meningismus. Hematological/Lymphatic/Immunilogical:   No cervical lymphadenopathy. Cardiovascular:   RRR. Symmetric bilateral radial and DP pulses.  No murmurs.  Respiratory:   Normal respiratory effort without tachypnea nor retractions. Breath sounds are clear and equal bilaterally. No wheezes/rales/rhonchi. Gastrointestinal:   Soft and nontender. Non distended. There is no CVA tenderness.  No rebound, rigidity, or guarding. Patient refuses rectal exam even after explaining need to rule out recurrent GI bleeding. Genitourinary:   deferred Musculoskeletal:   Normal range of motion in all extremities. No joint effusions.  No lower extremity tenderness.  There is a skin puncture site in the right antecubital fossa from IV placement during her recent hospitalization. Centered around this, there is a area of erythema on the right upper arm medial aspect that is warm, tender to the touch, and somewhat indurated. No fluctuance. No crepitus. No lymphangitis. It is not circumferential. There is not edema of the arm. Compartments are soft.. Neurologic:    Normal speech and language.  CN 2-10 normal. Motor grossly intact. No gross focal neurologic deficits are appreciated.  Skin:    Skin is warm, dry and intact. Inflammatory changes of the right arm as above.  ____________________________________________    LABS (pertinent positives/negatives) (all labs ordered are listed, but only abnormal results are displayed) Labs Reviewed  COMPREHENSIVE METABOLIC PANEL - Abnormal; Notable for the following:       Result Value   Chloride 100 (*)    Glucose, Bld 125 (*)    All other components within normal limits  CBC - Abnormal; Notable for the following:    WBC 11.7 (*)    RBC 3.26 (*)    Hemoglobin  9.2 (*)    HCT 28.0 (*)    RDW 18.9 (*)    All other components within normal limits  URINALYSIS, COMPLETE (UACMP) WITH MICROSCOPIC - Abnormal; Notable for the following:    Color, Urine YELLOW (*)    APPearance HAZY (*)    Protein, ur 100 (*)    Leukocytes, UA TRACE (*)    Squamous Epithelial / LPF 6-30 (*)    All other components within normal limits  POC OCCULT BLOOD, ED   ____________________________________________   EKG    ____________________________________________    RADIOLOGY  No results found.  ____________________________________________   PROCEDURES Procedures  ____________________________________________   INITIAL IMPRESSION / ASSESSMENT AND PLAN / ED COURSE  Pertinent labs & imaging results that were available during my care of the patient were reviewed by me and considered in my medical decision making (see chart for details).  Patient sent to the ED out of concerns for worsening blood loss anemia as well as right arm cellulitis.  It turns out her hemoglobin is stable. Her most recent hemoglobin levels have all been about 9 as it is today.  She is not having any symptoms to suggest recurrent GI bleeding, and she refuses rectal exam. She is adamant that she wants to go home and will follow up and monitor  her symptoms closely and return if she has any worsening symptoms such as dizziness lightheadedness syncope abdominal pain bleeding or fatigue.  Right upper extremity skin changes are consistent with cellulitis. Low suspicion for abscess or necrotizing fasciitis osteomyelitis septic arthritis or DVT. I'll start her on Keflex and have her follow up with primary care.         ____________________________________________   FINAL CLINICAL IMPRESSION(S) / ED DIAGNOSES  Final diagnoses:  Right arm cellulitis  Anemia, unspecified type      New Prescriptions   CEPHALEXIN (KEFLEX) 500 MG CAPSULE    Take 1 capsule (500 mg total) by mouth 3 (three) times daily.     Portions of this note were generated with dragon dictation software. Dictation errors may occur despite best attempts at proofreading.    Carrie Mew, MD 07/05/16 2203

## 2016-07-05 NOTE — ED Triage Notes (Addendum)
States she was in hospital recently for rectal bleeding, states she went for followup today and was brought to the ER for cellulitis in her right arm from the IV and from her hgb dropping since last visit, states she had 2 blood transfusions during her admission with hgb of 5, awake and alert, deneis any blood in her stool since discharge

## 2016-07-07 DIAGNOSIS — L03113 Cellulitis of right upper limb: Secondary | ICD-10-CM | POA: Diagnosis not present

## 2016-07-07 DIAGNOSIS — K922 Gastrointestinal hemorrhage, unspecified: Secondary | ICD-10-CM | POA: Diagnosis not present

## 2016-07-07 DIAGNOSIS — D62 Acute posthemorrhagic anemia: Secondary | ICD-10-CM | POA: Diagnosis not present

## 2016-07-07 NOTE — Telephone Encounter (Signed)
LMOVM for pt to call back in regards to ONO results.

## 2016-07-09 ENCOUNTER — Other Ambulatory Visit: Payer: Self-pay

## 2016-07-09 NOTE — Patient Outreach (Addendum)
[facility]) Care Management  07/09/2016  [name] 1940-12-19 [id number]   Referral Date:  07/09/16 Source:  [facility] Discharge Issue:  Red flags today for Got discharge papers: No  and Sad/hopeless/anxious/empty: Yes   APL:  Yes  [address]  Regina Hood [id number] [phone] (H) Psycho/Social: Patient lives home alone. Supportive son close by. Ambulates without any difficulty and denies falls over the past year. Depression:  Patient states she is on medication to manage depression and managed by PCP.  States her depression is managed and she does not need any further services.  States sadness/ anxiousness relating to an event of Cellulitis from IV site during hospital admission.  States after she discharged home that she  went to the hospital 1st floor to have arm assessed on Saturday 07/03/16.  Nurse advised to apply Warm compresses.  Patient completed follow-up  appt on Monday, 07/05/16 with  Dr. Percell Boston office.  Advised to go to the [place] walk in for MD assessment who advised to go to the ER.  Patient was prescribed Keflex and discharged home.  Patient to see Dawson Bills, PA (GI) on Tuesday,  07/13/16.  Next appt with PCP:  April 2018 Transportation: self Emergency Contact: son Advance Directive: no but states hospital provided her with the paper work. Consent:  Patient declines [facility] services.  DME: readers, scales  Patient confirms she did receive discharge summary / orders with list of medications and has no questions.    Encounter Medications:  Outpatient Encounter Prescriptions as of 07/09/2016  Medication Sig  . acetaminophen (TYLENOL) 500 MG tablet Take 1,000 mg by mouth 3 (three) times daily.  Marland Kitchen ADVAIR DISKUS 250-50 MCG/DOSE AEPB Inhale 1 puff into the lungs daily.  Marland Kitchen amLODipine (NORVASC) 5 MG tablet Take 5 mg by mouth daily.  . cephALEXin (KEFLEX) 500 MG capsule Take 1 capsule (500 mg total) by mouth 3 (three) times daily.  Marland Kitchen estradiol (ESTRACE) 1 MG tablet  Take 1 mg by mouth daily.  Marland Kitchen gabapentin (NEURONTIN) 100 MG capsule Take 100 mg by mouth 2 (two) times daily.  . hydroxychloroquine (PLAQUENIL) 200 MG tablet Take 200 mg by mouth 2 (two) times daily.  . metFORMIN (GLUCOPHAGE) 500 MG tablet Take 500 mg by mouth 2 (two) times daily with a meal.  . pantoprazole (PROTONIX) 40 MG tablet Take 40 mg by mouth daily.  . traMADol (ULTRAM) 50 MG tablet Take 25 mg by mouth 2 (two) times daily.   Marland Kitchen venlafaxine XR (EFFEXOR-XR) 75 MG 24 hr capsule Take 225 mg by mouth daily.    No facility-administered encounter medications on file as of 07/09/2016.     Functional Status:  In your present state of health, do you have any difficulty performing the following activities: 06/29/2016  Hearing? N  Vision? N  Difficulty concentrating or making decisions? Y  Walking or climbing stairs? Y  Dressing or bathing? N  Doing errands, shopping? Y  Some recent data might be hidden   Fall Risk  07/09/2016  Falls in the past year? No    Plan: Referral Date:  07/09/2016 [facility] Discharge and [facility] Screening:  07/09/16  RN CM advised to please notify MD of any changes in condition prior to scheduled appt's, keep all MD appt's and take medications as prescribed.  RN CM provided contact name and # [phone] or main office # [phone] and 24-hour nurse line # [phone].  RN CM confirmed patient is aware of 911 services for urgent emergency needs.  Case close. [facility] notified Physician case closure letter sent 07/09/16.   Nathaneil Canary, BSN, RN, [facility] Care Management Coordinator [phone] Direct [phone] Cell [phone] Office [phone] Fax [email or web address]

## 2016-07-09 NOTE — Telephone Encounter (Signed)
LMOVM for pt to return call 

## 2016-07-13 DIAGNOSIS — Z8719 Personal history of other diseases of the digestive system: Secondary | ICD-10-CM | POA: Diagnosis not present

## 2016-07-13 DIAGNOSIS — D500 Iron deficiency anemia secondary to blood loss (chronic): Secondary | ICD-10-CM | POA: Diagnosis not present

## 2016-07-13 DIAGNOSIS — K5100 Ulcerative (chronic) pancolitis without complications: Secondary | ICD-10-CM | POA: Diagnosis not present

## 2016-07-14 NOTE — Telephone Encounter (Signed)
LMOVM for pt to return call 

## 2016-07-15 NOTE — Telephone Encounter (Signed)
Pt informed of ONO results and states she already is using 2L QHS and with exertion with [employer]. No new orders placed at this time.  Pt states her Incruse is too expensive, informed pt to call her insurance company to see what is covered and how much her copay will be. Pt will call once done. Will close encounter at this time.

## 2016-07-15 NOTE — Telephone Encounter (Signed)
Pt returned call and LMOVM asking to call her back today. No answer. LMOVM for pt to return call.

## 2016-07-18 DIAGNOSIS — D509 Iron deficiency anemia, unspecified: Secondary | ICD-10-CM | POA: Insufficient documentation

## 2016-07-25 DIAGNOSIS — J449 Chronic obstructive pulmonary disease, unspecified: Secondary | ICD-10-CM | POA: Diagnosis not present

## 2016-07-26 DIAGNOSIS — M329 Systemic lupus erythematosus, unspecified: Secondary | ICD-10-CM | POA: Diagnosis not present

## 2016-07-26 DIAGNOSIS — R21 Rash and other nonspecific skin eruption: Secondary | ICD-10-CM | POA: Diagnosis not present

## 2016-07-26 DIAGNOSIS — K922 Gastrointestinal hemorrhage, unspecified: Secondary | ICD-10-CM | POA: Diagnosis not present

## 2016-07-26 DIAGNOSIS — R5383 Other fatigue: Secondary | ICD-10-CM | POA: Diagnosis not present

## 2016-08-02 DIAGNOSIS — R7989 Other specified abnormal findings of blood chemistry: Secondary | ICD-10-CM | POA: Diagnosis not present

## 2016-08-04 DIAGNOSIS — G894 Chronic pain syndrome: Secondary | ICD-10-CM | POA: Diagnosis not present

## 2016-08-04 DIAGNOSIS — R7989 Other specified abnormal findings of blood chemistry: Secondary | ICD-10-CM | POA: Diagnosis not present

## 2016-08-06 ENCOUNTER — Emergency Department
Admission: EM | Admit: 2016-08-06 | Discharge: 2016-08-06 | Disposition: A | Discharge status: Home / Self Care | Payer: PPO | Attending: Student in an Organized Health Care Education/Training Program | Admitting: Student in an Organized Health Care Education/Training Program

## 2016-08-06 ENCOUNTER — Encounter: Payer: Self-pay | Admitting: Emergency Medicine

## 2016-08-06 ENCOUNTER — Emergency Department: Payer: PPO

## 2016-08-06 DIAGNOSIS — K920 Hematemesis: Secondary | ICD-10-CM | POA: Insufficient documentation

## 2016-08-06 DIAGNOSIS — I10 Essential (primary) hypertension: Secondary | ICD-10-CM | POA: Diagnosis not present

## 2016-08-06 DIAGNOSIS — Z7984 Long term (current) use of oral hypoglycemic drugs: Secondary | ICD-10-CM | POA: Diagnosis not present

## 2016-08-06 DIAGNOSIS — Z87891 Personal history of nicotine dependence: Secondary | ICD-10-CM | POA: Diagnosis not present

## 2016-08-06 DIAGNOSIS — E119 Type 2 diabetes mellitus without complications: Secondary | ICD-10-CM | POA: Insufficient documentation

## 2016-08-06 DIAGNOSIS — R945 Abnormal results of liver function studies: Secondary | ICD-10-CM | POA: Insufficient documentation

## 2016-08-06 DIAGNOSIS — J449 Chronic obstructive pulmonary disease, unspecified: Secondary | ICD-10-CM | POA: Insufficient documentation

## 2016-08-06 DIAGNOSIS — Z79899 Other long term (current) drug therapy: Secondary | ICD-10-CM | POA: Diagnosis not present

## 2016-08-06 DIAGNOSIS — R748 Abnormal levels of other serum enzymes: Secondary | ICD-10-CM

## 2016-08-06 LAB — COMPREHENSIVE METABOLIC PANEL
ALBUMIN: 4 g/dL (ref 3.5–5.0)
ALT: 379 U/L — ABNORMAL HIGH (ref 14–54)
ANION GAP: 10 (ref 5–15)
AST: 340 U/L — AB (ref 15–41)
Alkaline Phosphatase: 420 U/L — ABNORMAL HIGH (ref 38–126)
BILIRUBIN TOTAL: 0.9 mg/dL (ref 0.3–1.2)
BUN: 12 mg/dL (ref 6–20)
CHLORIDE: 99 mmol/L — AB (ref 101–111)
CO2: 26 mmol/L (ref 22–32)
Calcium: 9.7 mg/dL (ref 8.9–10.3)
Creatinine, Ser: 0.47 mg/dL (ref 0.44–1.00)
Glucose, Bld: 160 mg/dL — ABNORMAL HIGH (ref 65–99)
POTASSIUM: 3.9 mmol/L (ref 3.5–5.1)
Sodium: 135 mmol/L (ref 135–145)
TOTAL PROTEIN: 7.7 g/dL (ref 6.5–8.1)

## 2016-08-06 LAB — CBC
HCT: 36.5 % (ref 35.0–47.0)
HEMOGLOBIN: 11.3 g/dL — AB (ref 12.0–16.0)
MCH: 26.2 pg (ref 26.0–34.0)
MCHC: 31.1 g/dL — AB (ref 32.0–36.0)
MCV: 84.2 fL (ref 80.0–100.0)
Platelets: 327 10*3/uL (ref 150–440)
RBC: 4.34 MIL/uL (ref 3.80–5.20)
RDW: 16.7 % — ABNORMAL HIGH (ref 11.5–14.5)
WBC: 8.3 10*3/uL (ref 3.6–11.0)

## 2016-08-06 LAB — TYPE AND SCREEN
ABO/RH(D): O POS
Antibody Screen: NEGATIVE

## 2016-08-06 LAB — PROTIME-INR
INR: 0.86
Prothrombin Time: 11.7 seconds (ref 11.4–15.2)

## 2016-08-06 LAB — LIPASE, BLOOD: Lipase: 21 U/L (ref 11–51)

## 2016-08-06 MED ORDER — PROCHLORPERAZINE EDISYLATE 5 MG/ML IJ SOLN
INTRAMUSCULAR | Status: AC
  Administered 2016-08-06: 10 mg via INTRAVENOUS
  Filled 2016-08-06: qty 2

## 2016-08-06 MED ORDER — DIPHENHYDRAMINE HCL 50 MG/ML IJ SOLN
INTRAMUSCULAR | Status: AC
  Administered 2016-08-06: 25 mg via INTRAVENOUS
  Filled 2016-08-06: qty 1

## 2016-08-06 MED ORDER — PROCHLORPERAZINE EDISYLATE 5 MG/ML IJ SOLN
10.0000 mg | Freq: Once | INTRAMUSCULAR | Status: AC
  Administered 2016-08-06: 10 mg via INTRAVENOUS

## 2016-08-06 MED ORDER — ONDANSETRON HCL 4 MG/2ML IJ SOLN
4.0000 mg | Freq: Once | INTRAMUSCULAR | Status: AC
  Administered 2016-08-06: 4 mg via INTRAVENOUS
  Filled 2016-08-06: qty 2

## 2016-08-06 MED ORDER — DIPHENHYDRAMINE HCL 50 MG/ML IJ SOLN
25.0000 mg | Freq: Once | INTRAMUSCULAR | Status: AC
  Administered 2016-08-06: 25 mg via INTRAVENOUS

## 2016-08-06 MED ORDER — SODIUM CHLORIDE 0.9 % IV BOLUS (SEPSIS)
500.0000 mL | Freq: Once | INTRAVENOUS | Status: AC
  Administered 2016-08-06: 500 mL via INTRAVENOUS

## 2016-08-06 NOTE — ED Triage Notes (Signed)
Pt to ED from home c/o vomiting blood since last night.  States woke up around 0430 and vomited with bright red blood in vomit.  Vomited again around 0600 without any blood.  Reports feeling tired, denies pain, denies SOB.  Pt presents A&Ox4, skin warm and dry.

## 2016-08-06 NOTE — ED Notes (Signed)
Pt ambulated to restroom, when back in bed, pt O2 sat at 88%, pt states that she does feel short of breath but she has a hx/o sarcoidoses and wears O2 at home with activity. Pt O2 sat back to 96% on room air.  MD aware

## 2016-08-06 NOTE — ED Provider Notes (Signed)
[facility] Emergency Department Provider Note    None    (approximate)  I have reviewed the triage vital signs and the nursing notes.   HISTORY  Chief Complaint Hematemesis    HPI Regina Hood is a 76 y.o. female presents with an episode of hematemesis last night.  Did have an admission for GI bleed but did not have endoscopy or colonoscopy.she's not on any blood thinners. Denies any recent fevers. Denies any pain. Had only one episode of blood-tinged vomit last night. Denies any chest pain. Has been having na and absent ap today.  No recent medications.   Past Medical History:  Diagnosis Date  . Arthritis   . COPD (chronic obstructive pulmonary disease) ([place])   . Depression   . Diabetes mellitus   . Diverticulitis   . GERD (gastroesophageal reflux disease)   . GI bleed   . Headache(784.0)   . Hypertension   . Lupus   . Ulcerative colitis ([place])    Family History  Problem Relation Age of Onset  . Breast cancer Mother 69   Past Surgical History:  Procedure Laterality Date  . ABDOMINAL HYSTERECTOMY    . BREAST BIOPSY Right   . COLONOSCOPY    . ORIF HUMERUS FRACTURE  09/17/2011   Procedure: OPEN REDUCTION INTERNAL FIXATION (ORIF) PROXIMAL HUMERUS FRACTURE;  Surgeon: Augustin Schooling, MD;  Location: [place];  Service: Orthopedics;  Laterality: Left;   Patient Active Problem List   Diagnosis Date Noted  . Rectal bleeding 06/29/2016  . Proximal humerus fracture, left, closed, initial encounter 09/17/2011      Prior to Admission medications   Medication Sig Start Date End Date Taking? Authorizing Provider  acetaminophen (TYLENOL) 500 MG tablet Take 1,000 mg by mouth 3 (three) times daily.    Historical Provider, MD  ADVAIR DISKUS 250-50 MCG/DOSE AEPB Inhale 1 puff into the lungs daily. 05/05/16   Historical Provider, MD  amLODipine (NORVASC) 5 MG tablet Take 5 mg by mouth daily. 06/01/16   Historical Provider, MD  cephALEXin (KEFLEX) 500 MG  capsule Take 1 capsule (500 mg total) by mouth 3 (three) times daily. 07/05/16   Carrie Mew, MD  estradiol (ESTRACE) 1 MG tablet Take 1 mg by mouth daily. 06/16/16   Historical Provider, MD  gabapentin (NEURONTIN) 100 MG capsule Take 100 mg by mouth 2 (two) times daily.    Historical Provider, MD  hydroxychloroquine (PLAQUENIL) 200 MG tablet Take 200 mg by mouth 2 (two) times daily. 06/16/16   Historical Provider, MD  metFORMIN (GLUCOPHAGE) 500 MG tablet Take 500 mg by mouth 2 (two) times daily with a meal.    Historical Provider, MD  pantoprazole (PROTONIX) 40 MG tablet Take 40 mg by mouth daily.    Historical Provider, MD  traMADol (ULTRAM) 50 MG tablet Take 25 mg by mouth 2 (two) times daily.     Historical Provider, MD  venlafaxine XR (EFFEXOR-XR) 75 MG 24 hr capsule Take 225 mg by mouth daily.     Historical Provider, MD    Allergies Penicillamine    Social History Social History  Substance Use Topics  . Smoking status: Former Smoker    Packs/day: 1.00    Years: 10.00    Types: Cigarettes  . Smokeless tobacco: Never Used     Comment: quit smoking in 2003  . Alcohol use No    Review of Systems Patient denies headaches, rhinorrhea, blurry vision, numbness, shortness of breath, chest pain, edema, cough,  abdominal pain, nausea, vomiting, diarrhea, dysuria, fevers, rashes or hallucinations unless otherwise stated above in HPI. ____________________________________________   PHYSICAL EXAM:  VITAL SIGNS: Vitals:   08/06/16 1144  BP: (!) 171/86  Pulse: (!) 103  Resp: 16  Temp: 98 F (36.7 C)    Constitutional: Alert and oriented. Well appearing and in no acute distress. Eyes: Conjunctivae are normal. PERRL. EOMI. Head: Atraumatic. Nose: No congestion/rhinnorhea. Mouth/Throat: Mucous membranes are moist.  Oropharynx non-erythematous. Neck: No stridor. Painless ROM. No cervical spine tenderness to palpation Hematological/Lymphatic/Immunilogical: No cervical  lymphadenopathy. Cardiovascular: Normal rate, regular rhythm. Grossly normal heart sounds.  Good peripheral circulation. Respiratory: Normal respiratory effort.  No retractions. Lungs CTAB. Gastrointestinal: Soft and nontender. No distention. No abdominal bruits. No CVA tenderness. Genitourinary: rectal exam with guaiac negative.  No masses Musculoskeletal: No lower extremity tenderness nor edema.  No joint effusions. Neurologic:  Normal speech and language. No gross focal neurologic deficits are appreciated. No gait instability. Skin:  Skin is warm, dry and intact. No rash noted. Psychiatric: Mood and affect are normal. Speech and behavior are normal.  ____________________________________________   LABS (all labs ordered are listed, but only abnormal results are displayed)  Results for orders placed or performed during the hospital encounter of 08/06/16 (from the past 24 hour(s))  Comprehensive metabolic panel     Status: Abnormal   Collection Time: 08/06/16 11:51 AM  Result Value Ref Range   Sodium 135 135 - 145 mmol/L   Potassium 3.9 3.5 - 5.1 mmol/L   Chloride 99 (L) 101 - 111 mmol/L   CO2 26 22 - 32 mmol/L   Glucose, Bld 160 (H) 65 - 99 mg/dL   BUN 12 6 - 20 mg/dL   Creatinine, Ser 0.47 0.44 - 1.00 mg/dL   Calcium 9.7 8.9 - 10.3 mg/dL   Total Protein 7.7 6.5 - 8.1 g/dL   Albumin 4.0 3.5 - 5.0 g/dL   AST 340 (H) 15 - 41 U/L   ALT 379 (H) 14 - 54 U/L   Alkaline Phosphatase 420 (H) 38 - 126 U/L   Total Bilirubin 0.9 0.3 - 1.2 mg/dL   GFR calc non Af Amer >60 >60 mL/min   GFR calc Af Amer >60 >60 mL/min   Anion gap 10 5 - 15  CBC     Status: Abnormal   Collection Time: 08/06/16 11:51 AM  Result Value Ref Range   WBC 8.3 3.6 - 11.0 K/uL   RBC 4.34 3.80 - 5.20 MIL/uL   Hemoglobin 11.3 (L) 12.0 - 16.0 g/dL   HCT 36.5 35.0 - 47.0 %   MCV 84.2 80.0 - 100.0 fL   MCH 26.2 26.0 - 34.0 pg   MCHC 31.1 (L) 32.0 - 36.0 g/dL   RDW 16.7 (H) 11.5 - 14.5 %   Platelets 327 150 - 440  K/uL  Type and screen [place]     Status: None   Collection Time: 08/06/16 11:51 AM  Result Value Ref Range   ABO/RH(D) O POS    Antibody Screen NEG    Sample Expiration 08/09/2016    ____________________________________________  ____________________________________________  RADIOLOGY  I personally reviewed all radiographic images ordered to evaluate for the above acute complaints and reviewed radiology reports and findings.  These findings were personally discussed with the patient.  Please see medical record for radiology report.  ____________________________________________   PROCEDURES  Procedure(s) performed:  Procedures    Critical Care performed: no ____________________________________________   INITIAL IMPRESSION /  ASSESSMENT AND PLAN / ED COURSE  Pertinent labs & imaging results that were available during my care of the patient were reviewed by me and considered in my medical decision making (see chart for details).  DDX: gi bleed, gastritis, enteritis, dehydration  MAZIE FENCL is a 76 y.o. who presents to the ED with complaints as described above.  Patient is AFVSS in ED. Exam as above. Given current presentation have considered the above differential.  Her abdominal exam is soft and benign. She only had one episode of hematemesis last night. Does not appear toxic.  She is not on any blood thinners.  The patient will be placed on continuous pulse oximetry and telemetry for monitoring.  Laboratory evaluation will be sent to evaluate for the above complaints.       Clinical Course as of Aug 07 2147  Fri Aug 06, 2016  1508 Sample Expiration: 08/09/2016 [PR]  1718 Lipase, blood [PR]  1734 Rectal exam is guaiac negative.  PAtient is tolerating oral hydration. Right upper quadrant ultrasound is normal.  Blood work is otherwise reassuring.  Abdominal exam is benign.  Do not feel CT imaging indicated at this time.  Do feel patient is stable for  follow up with PCP.  Have discussed with the patient and available family all diagnostics and treatments performed thus far and all questions were answered to the best of my ability. The patient demonstrates understanding and agreement with plan.   [PR]    Clinical Course User Index [PR] Merlyn Lot, MD     ____________________________________________   FINAL CLINICAL IMPRESSION(S) / ED DIAGNOSES  Final diagnoses:  Elevated liver enzymes  Hematemesis, presence of nausea not specified      NEW MEDICATIONS STARTED DURING THIS VISIT:  New Prescriptions   No medications on file     Note:  This document was prepared using Dragon voice recognition software and may include unintentional dictation errors.    Merlyn Lot, MD 08/06/16 2152

## 2016-08-06 NOTE — ED Notes (Signed)
E-sign pad not working. Pt denies questions and verbalized understanding of follow up care and discharge instructions.

## 2016-08-06 NOTE — Discharge Instructions (Signed)
Return immediately should you develop any worsening pain, nausea, bloody diarrhea, dark stools or fever.

## 2016-08-17 ENCOUNTER — Encounter: Payer: Self-pay | Admitting: *Deleted

## 2016-08-18 ENCOUNTER — Encounter
Admission: RE | Disposition: A | Discharge status: Home / Self Care | Payer: Self-pay | Source: Ambulatory Visit | Attending: Unknown Physician Specialty

## 2016-08-18 ENCOUNTER — Ambulatory Visit: Payer: PPO | Admitting: *Deleted

## 2016-08-18 ENCOUNTER — Encounter: Payer: Self-pay | Admitting: Anesthesiology

## 2016-08-18 ENCOUNTER — Ambulatory Visit
Admission: RE | Admit: 2016-08-18 | Discharge: 2016-08-18 | Disposition: A | Discharge status: Home / Self Care | Payer: PPO | Source: Ambulatory Visit | Attending: Unknown Physician Specialty | Admitting: Unknown Physician Specialty

## 2016-08-18 DIAGNOSIS — K5730 Diverticulosis of large intestine without perforation or abscess without bleeding: Secondary | ICD-10-CM | POA: Insufficient documentation

## 2016-08-18 DIAGNOSIS — K640 First degree hemorrhoids: Secondary | ICD-10-CM | POA: Insufficient documentation

## 2016-08-18 DIAGNOSIS — K219 Gastro-esophageal reflux disease without esophagitis: Secondary | ICD-10-CM | POA: Insufficient documentation

## 2016-08-18 DIAGNOSIS — I10 Essential (primary) hypertension: Secondary | ICD-10-CM | POA: Insufficient documentation

## 2016-08-18 DIAGNOSIS — D122 Benign neoplasm of ascending colon: Secondary | ICD-10-CM | POA: Insufficient documentation

## 2016-08-18 DIAGNOSIS — K2950 Unspecified chronic gastritis without bleeding: Secondary | ICD-10-CM | POA: Insufficient documentation

## 2016-08-18 DIAGNOSIS — E119 Type 2 diabetes mellitus without complications: Secondary | ICD-10-CM | POA: Insufficient documentation

## 2016-08-18 DIAGNOSIS — K51011 Ulcerative (chronic) pancolitis with rectal bleeding: Secondary | ICD-10-CM | POA: Diagnosis not present

## 2016-08-18 DIAGNOSIS — Z7984 Long term (current) use of oral hypoglycemic drugs: Secondary | ICD-10-CM | POA: Diagnosis not present

## 2016-08-18 DIAGNOSIS — K625 Hemorrhage of anus and rectum: Secondary | ICD-10-CM | POA: Diagnosis not present

## 2016-08-18 DIAGNOSIS — Z79899 Other long term (current) drug therapy: Secondary | ICD-10-CM | POA: Insufficient documentation

## 2016-08-18 DIAGNOSIS — F329 Major depressive disorder, single episode, unspecified: Secondary | ICD-10-CM | POA: Diagnosis not present

## 2016-08-18 DIAGNOSIS — K5790 Diverticulosis of intestine, part unspecified, without perforation or abscess without bleeding: Secondary | ICD-10-CM | POA: Diagnosis not present

## 2016-08-18 DIAGNOSIS — K2960 Other gastritis without bleeding: Secondary | ICD-10-CM | POA: Diagnosis not present

## 2016-08-18 DIAGNOSIS — D62 Acute posthemorrhagic anemia: Secondary | ICD-10-CM | POA: Insufficient documentation

## 2016-08-18 DIAGNOSIS — J449 Chronic obstructive pulmonary disease, unspecified: Secondary | ICD-10-CM | POA: Insufficient documentation

## 2016-08-18 DIAGNOSIS — K449 Diaphragmatic hernia without obstruction or gangrene: Secondary | ICD-10-CM | POA: Diagnosis not present

## 2016-08-18 DIAGNOSIS — M329 Systemic lupus erythematosus, unspecified: Secondary | ICD-10-CM | POA: Diagnosis not present

## 2016-08-18 DIAGNOSIS — D509 Iron deficiency anemia, unspecified: Secondary | ICD-10-CM | POA: Diagnosis not present

## 2016-08-18 DIAGNOSIS — K5289 Other specified noninfective gastroenteritis and colitis: Secondary | ICD-10-CM | POA: Diagnosis not present

## 2016-08-18 DIAGNOSIS — K317 Polyp of stomach and duodenum: Secondary | ICD-10-CM | POA: Diagnosis not present

## 2016-08-18 DIAGNOSIS — Z87891 Personal history of nicotine dependence: Secondary | ICD-10-CM | POA: Diagnosis not present

## 2016-08-18 DIAGNOSIS — K3189 Other diseases of stomach and duodenum: Secondary | ICD-10-CM | POA: Diagnosis not present

## 2016-08-18 DIAGNOSIS — K222 Esophageal obstruction: Secondary | ICD-10-CM | POA: Diagnosis not present

## 2016-08-18 DIAGNOSIS — K529 Noninfective gastroenteritis and colitis, unspecified: Secondary | ICD-10-CM | POA: Diagnosis not present

## 2016-08-18 HISTORY — DX: Other intervertebral disc degeneration, lumbar region: M51.36

## 2016-08-18 HISTORY — PX: ESOPHAGOGASTRODUODENOSCOPY (EGD) WITH PROPOFOL: SHX5813

## 2016-08-18 HISTORY — DX: Cellulitis, unspecified: L03.90

## 2016-08-18 HISTORY — DX: Other intervertebral disc degeneration, lumbar region without mention of lumbar back pain or lower extremity pain: M51.369

## 2016-08-18 HISTORY — PX: COLONOSCOPY WITH PROPOFOL: SHX5780

## 2016-08-18 LAB — GLUCOSE, CAPILLARY: GLUCOSE-CAPILLARY: 157 mg/dL — AB (ref 65–99)

## 2016-08-18 SURGERY — COLONOSCOPY WITH PROPOFOL
Anesthesia: General

## 2016-08-18 MED ORDER — SODIUM CHLORIDE 0.9 % IV SOLN: INTRAVENOUS | Status: DC

## 2016-08-18 MED ORDER — SODIUM CHLORIDE 0.9 % IV SOLN
INTRAVENOUS | Status: DC
  Administered 2016-08-18: 1000 mL via INTRAVENOUS
  Administered 2016-08-18: 09:00:00 via INTRAVENOUS

## 2016-08-18 MED ORDER — LIDOCAINE HCL (PF) 1 % IJ SOLN
INTRAMUSCULAR | Status: AC
  Administered 2016-08-18: 0.3 mL via INTRADERMAL
  Filled 2016-08-18: qty 2

## 2016-08-18 MED ORDER — PROPOFOL 10 MG/ML IV BOLUS
INTRAVENOUS | Status: AC
  Filled 2016-08-18: qty 20

## 2016-08-18 MED ORDER — PROPOFOL 10 MG/ML IV BOLUS
INTRAVENOUS | Status: DC | PRN
  Administered 2016-08-18: 650 mg via INTRAVENOUS

## 2016-08-18 MED ORDER — PHENYLEPHRINE HCL 10 MG/ML IJ SOLN
INTRAMUSCULAR | Status: DC | PRN
  Administered 2016-08-18: 100 ug via INTRAVENOUS

## 2016-08-18 MED ORDER — LIDOCAINE HCL (PF) 1 % IJ SOLN
2.0000 mL | Freq: Once | INTRAMUSCULAR | Status: AC
  Administered 2016-08-18: 0.3 mL via INTRADERMAL

## 2016-08-18 MED ORDER — KETAMINE HCL 10 MG/ML IJ SOLN
INTRAMUSCULAR | Status: DC | PRN
  Administered 2016-08-18: 10 mg via INTRAVENOUS

## 2016-08-18 MED ORDER — PROPOFOL 500 MG/50ML IV EMUL
INTRAVENOUS | Status: AC
  Filled 2016-08-18: qty 50

## 2016-08-18 NOTE — Anesthesia Preprocedure Evaluation (Addendum)
Anesthesia Evaluation  Patient identified by MRN, date of birth, ID band Patient awake    Reviewed: Allergy & Precautions, H&P , NPO status , Patient's Chart, lab work & pertinent test results  History of Anesthesia Complications Negative for: history of anesthetic complications  Airway Mallampati: I  TM Distance: >3 FB Neck ROM: full    Dental  (+) Teeth Intact, Dental Advisory Given   Pulmonary COPD, former smoker,    Pulmonary exam normal        Cardiovascular hypertension, Pt. on medications Normal cardiovascular exam Rhythm:regular Rate:Normal     Neuro/Psych  Headaches, PSYCHIATRIC DISORDERS Depression    GI/Hepatic PUD, GERD  ,  Endo/Other  diabetes, Well Controlled, Type 2, Oral Hypoglycemic Agents  Renal/GU   negative genitourinary   Musculoskeletal  (+) Arthritis , Osteoarthritis,    Abdominal Normal abdominal exam  (+)   Peds negative pediatric ROS (+)  Hematology   Anesthesia Other Findings   Reproductive/Obstetrics                             Anesthesia Physical  Anesthesia Plan  ASA: II  Anesthesia Plan: General   Post-op Pain Management:    Induction: Intravenous  Airway Management Planned: Nasal Cannula  Additional Equipment:   Intra-op Plan:   Post-operative Plan:   Informed Consent: I have reviewed the patients History and Physical, chart, labs and discussed the procedure including the risks, benefits and alternatives for the proposed anesthesia with the patient or authorized representative who has indicated his/her understanding and acceptance.   Dental advisory given  Plan Discussed with: CRNA, Anesthesiologist and Surgeon  Anesthesia Plan Comments:         Anesthesia Quick Evaluation

## 2016-08-18 NOTE — Op Note (Signed)
[facility] Gastroenterology Patient Name: Regina Hood Procedure Date: 08/18/2016 9:08 AM MRN: [id number] Account #: [id number] Date of Birth: 1941/01/08 Admit Type: Outpatient Age: 76 Room: [facility] ENDO ROOM 4 Gender: Female Note Status: Finalized Procedure:            Upper GI endoscopy Indications:          Acute post hemorrhagic anemia, Iron deficiency anemia Providers:            Manya Silvas, MD Medicines:            Propofol per Anesthesia Complications:        No immediate complications. Procedure:            Pre-Anesthesia Assessment:                       - After reviewing the risks and benefits, the patient                        was deemed in satisfactory condition to undergo the                        procedure.                       After obtaining informed consent, the endoscope was                        passed under direct vision. Throughout the procedure,                        the patient's blood pressure, pulse, and oxygen                        saturations were monitored continuously. The Endoscope                        was introduced through the mouth, and advanced to the                        second part of duodenum. The upper GI endoscopy was                        accomplished without difficulty. The patient tolerated                        the procedure well. Findings:      A mild Schatzki ring (acquired) was found at the gastroesophageal       junction.      A single 4 mm sessile polyp with no bleeding and no stigmata of recent       bleeding was found in the cardia. Biopsies were taken with a cold       forceps for histology.      Diffuse moderately congested mucosa was found in the gastric body.       Biopsies were taken with a cold forceps for histology. Biopsies were       taken with a cold forceps for Helicobacter pylori testing.      The examined duodenum was normal. Impression:           - Mild Schatzki ring.       - A single gastric  polyp. Biopsied.                       - Congestive gastropathy. Biopsied.                       - Normal examined duodenum. Recommendation:       - Await pathology results. Manya Silvas, MD 08/18/2016 10:10:06 AM This report has been signed electronically. Number of Addenda: 0 Note Initiated On: 08/18/2016 9:08 AM      [facility]

## 2016-08-18 NOTE — Op Note (Signed)
[facility] Gastroenterology Patient Name: Regina Hood Procedure Date: 08/18/2016 9:08 AM MRN: [id number] Account #: [id number] Date of Birth: Feb 17, 1941 Admit Type: Outpatient Age: 76 Room: [facility] ENDO ROOM 4 Gender: Female Note Status: Finalized Procedure:            Colonoscopy Indications:          Rectal bleeding, Acute post hemorrhagic anemia,                        Unexplained iron deficiency anemia Providers:            Manya Silvas, MD Referring MD:         Leonie Douglas. Doy Hutching, MD (Referring MD) Medicines:            Propofol per Anesthesia Complications:        No immediate complications. Procedure:            Pre-Anesthesia Assessment:                       - After reviewing the risks and benefits, the patient                        was deemed in satisfactory condition to undergo the                        procedure.                       After obtaining informed consent, the colonoscope was                        passed under direct vision. Throughout the procedure,                        the patient's blood pressure, pulse, and oxygen                        saturations were monitored continuously. The                        Colonoscope was introduced through the anus and                        advanced to the the cecum, identified by appendiceal                        orifice and ileocecal valve. The colonoscopy was                        performed without difficulty. The patient tolerated the                        procedure well. The quality of the bowel preparation                        was good. Findings:      A 10 mm polyp was found in the proximal ascending colon. The polyp was       sessile. The polyp was removed with a hot snare. Resection and retrieval       were complete. To prevent bleeding after the polypectomy, two hemostatic  clips were successfully placed. There was no bleeding during, or at the       end, of the procedure.  Inflammation characterized by erosions, erythema and granularity was       found. The proximal ascending colon was spared. This was mild in       severity. Biopsies were taken with a cold forceps for histology.      Multiple medium-mouthed diverticula were found in the sigmoid colon,       descending colon and transverse colon.      Internal hemorrhoids were found during endoscopy. The hemorrhoids were       small and Grade I (internal hemorrhoids that do not prolapse). Impression:           - One 10 mm polyp in the proximal ascending colon,                        removed with a hot snare. Resected and retrieved. Clips                        were placed.                       - Colitis. Inflammation was found. This was mild in                        severity. Biopsied.                       - Diverticulosis in the sigmoid colon, in the                        descending colon and in the transverse colon.                       - Internal hemorrhoids. Recommendation:       - Await pathology results. Work up for anemia, puritis. Manya Silvas, MD 08/18/2016 9:58:21 AM This report has been signed electronically. Number of Addenda: 0 Note Initiated On: 08/18/2016 9:08 AM Scope Withdrawal Time: 0 hours 14 minutes 52 seconds  Total Procedure Duration: 0 hours 28 minutes 52 seconds       [facility]

## 2016-08-18 NOTE — Anesthesia Post-op Follow-up Note (Cosign Needed)
Anesthesia QCDR form completed.        

## 2016-08-18 NOTE — Anesthesia Postprocedure Evaluation (Signed)
Anesthesia Post Note  Patient: Regina Hood  Procedure(s) Performed: Procedure(s) (LRB): COLONOSCOPY WITH PROPOFOL (N/A) ESOPHAGOGASTRODUODENOSCOPY (EGD) WITH PROPOFOL (N/A)  Patient location during evaluation: PACU Anesthesia Type: General Level of consciousness: awake and alert and oriented Pain management: pain level controlled Vital Signs Assessment: post-procedure vital signs reviewed and stable Respiratory status: spontaneous breathing Cardiovascular status: blood pressure returned to baseline Anesthetic complications: no     Last Vitals:  Vitals:   08/18/16 1014 08/18/16 1024  BP: 120/65 136/74  Pulse: 82 86  Resp: 13 17  Temp: 36.4 C     Last Pain:  Vitals:   08/18/16 1014  TempSrc: Tympanic  PainSc:                  [name]

## 2016-08-18 NOTE — Transfer of Care (Signed)
Immediate Anesthesia Transfer of Care Note  Patient: Regina Hood  Procedure(s) Performed: Procedure(s): COLONOSCOPY WITH PROPOFOL (N/A) ESOPHAGOGASTRODUODENOSCOPY (EGD) WITH PROPOFOL (N/A)  Patient Location: PACU  Anesthesia Type:General  Level of Consciousness: awake, alert  and oriented  Airway & Oxygen Therapy: Patient Spontanous Breathing and Patient connected to nasal cannula oxygen  Post-op Assessment: Report given to RN and Post -op Vital signs reviewed and stable  Post vital signs: Reviewed and stable  Last Vitals:  Vitals:   08/18/16 0841  BP: (!) 144/80  Pulse: 96  Resp: 17  Temp: (!) 35.8 C    Last Pain:  Vitals:   08/18/16 0841  TempSrc: Tympanic  PainSc: 9          Complications: No apparent anesthesia complications

## 2016-08-18 NOTE — H&P (Signed)
Primary Care Physician:  Idelle Crouch, MD Primary Gastroenterologist:  Dr. Vira Agar  Pre-Procedure History & Physical: HPI:  Regina Hood is a 76 y.o. female is here for an endoscopy and colonoscopy.   Past Medical History:  Diagnosis Date  . Arthritis   . Cellulitis   . COPD (chronic obstructive pulmonary disease) ([place])   . DDD (degenerative disc disease), lumbar   . Depression   . Diabetes mellitus   . Diverticulitis   . GERD (gastroesophageal reflux disease)   . GI bleed   . Headache(784.0)   . Hypertension   . Lupus   . Ulcerative colitis [facility])     Past Surgical History:  Procedure Laterality Date  . ABDOMINAL HYSTERECTOMY    . BREAST BIOPSY Right   . COLONOSCOPY    . ORIF HUMERUS FRACTURE  09/17/2011   Procedure: OPEN REDUCTION INTERNAL FIXATION (ORIF) PROXIMAL HUMERUS FRACTURE;  Surgeon: Augustin Schooling, MD;  Location: [place];  Service: Orthopedics;  Laterality: Left;    Prior to Admission medications   Medication Sig Start Date End Date Taking? Authorizing Provider  acetaminophen (TYLENOL) 500 MG tablet Take 1,000 mg by mouth 3 (three) times daily.    Historical Provider, MD  ADVAIR DISKUS 250-50 MCG/DOSE AEPB Inhale 1 puff into the lungs daily. 05/05/16   Historical Provider, MD  amLODipine (NORVASC) 5 MG tablet Take 5 mg by mouth daily. 06/01/16   Historical Provider, MD  cephALEXin (KEFLEX) 500 MG capsule Take 1 capsule (500 mg total) by mouth 3 (three) times daily. Patient not taking: Reported on 08/06/2016 07/05/16   Carrie Mew, MD  estradiol (ESTRACE) 1 MG tablet Take 1 mg by mouth daily. 06/16/16   Historical Provider, MD  gabapentin (NEURONTIN) 100 MG capsule Take 100 mg by mouth 2 (two) times daily.    Historical Provider, MD  hydroxychloroquine (PLAQUENIL) 200 MG tablet Take 200 mg by mouth 2 (two) times daily. 06/16/16   Historical Provider, MD  metFORMIN (GLUCOPHAGE) 500 MG tablet Take 500 mg by mouth 2 (two) times daily with a meal.    Historical  Provider, MD  pantoprazole (PROTONIX) 40 MG tablet Take 40 mg by mouth daily.    Historical Provider, MD  traMADol (ULTRAM) 50 MG tablet Take 25 mg by mouth 2 (two) times daily.     Historical Provider, MD  venlafaxine XR (EFFEXOR-XR) 75 MG 24 hr capsule Take 225 mg by mouth daily.     Historical Provider, MD    Allergies as of 07/09/2016 - Review Complete 07/09/2016  Allergen Reaction Noted  . Penicillamine Other (See Comments) 10/03/2013    Family History  Problem Relation Age of Onset  . Breast cancer Mother 41    Social History   Social History  . Marital status: Widowed    Spouse name: N/A  . Number of children: N/A  . Years of education: N/A   Occupational History  . Not on file.   Social History Main Topics  . Smoking status: Former Smoker    Packs/day: 1.00    Years: 10.00    Types: Cigarettes  . Smokeless tobacco: Never Used     Comment: quit smoking in 2003  . Alcohol use No  . Drug use: No  . Sexual activity: Not on file   Other Topics Concern  . Not on file   Social History Narrative  . No narrative on file    Review of Systems: See HPI, otherwise negative ROS  Physical Exam: BP Marland Kitchen)  144/80   Pulse 96   Temp (!) 96.5 F (35.8 C) (Tympanic)   Resp 17   Ht 5' 2"  (1.575 m)   Wt 75.8 kg (167 lb)   SpO2 97%   BMI 30.54 kg/m  General:   Alert,  pleasant and cooperative in NAD Head:  Normocephalic and atraumatic. Neck:  Supple; no masses or thyromegaly. Lungs:  Clear throughout to auscultation.    Heart:  Regular rate and rhythm. Abdomen:  Soft, nontender and nondistended. Normal bowel sounds, without guarding, and without rebound.   Neurologic:  Alert and  oriented x4;  grossly normal neurologically.  Impression/Plan: Regina Hood is here for an endoscopy and colonoscopy to be performed for iron def anemia, follow up of ulcerative colitis, pruritis for last 6 weeks  Risks, benefits, limitations, and alternatives regarding  endoscopy and  colonoscopy have been reviewed with the patient.  Questions have been answered.  All parties agreeable.   Gaylyn Cheers, MD  08/18/2016, 10:11 AM

## 2016-08-19 ENCOUNTER — Encounter: Payer: Self-pay | Admitting: Unknown Physician Specialty

## 2016-08-19 ENCOUNTER — Other Ambulatory Visit: Payer: Self-pay | Admitting: Nurse Practitioner

## 2016-08-19 DIAGNOSIS — R7989 Other specified abnormal findings of blood chemistry: Secondary | ICD-10-CM

## 2016-08-19 DIAGNOSIS — L282 Other prurigo: Secondary | ICD-10-CM | POA: Insufficient documentation

## 2016-08-19 DIAGNOSIS — R945 Abnormal results of liver function studies: Principal | ICD-10-CM

## 2016-08-19 DIAGNOSIS — R160 Hepatomegaly, not elsewhere classified: Secondary | ICD-10-CM | POA: Diagnosis not present

## 2016-08-19 DIAGNOSIS — R748 Abnormal levels of other serum enzymes: Secondary | ICD-10-CM | POA: Insufficient documentation

## 2016-08-20 ENCOUNTER — Ambulatory Visit
Admission: RE | Admit: 2016-08-20 | Discharge: 2016-08-20 | Disposition: A | Discharge status: Home / Self Care | Payer: PPO | Source: Ambulatory Visit | Attending: Nurse Practitioner | Admitting: Nurse Practitioner

## 2016-08-20 DIAGNOSIS — R7989 Other specified abnormal findings of blood chemistry: Secondary | ICD-10-CM | POA: Diagnosis not present

## 2016-08-20 DIAGNOSIS — R160 Hepatomegaly, not elsewhere classified: Secondary | ICD-10-CM | POA: Insufficient documentation

## 2016-08-20 DIAGNOSIS — R918 Other nonspecific abnormal finding of lung field: Secondary | ICD-10-CM | POA: Insufficient documentation

## 2016-08-20 DIAGNOSIS — R945 Abnormal results of liver function studies: Secondary | ICD-10-CM

## 2016-08-20 DIAGNOSIS — K5730 Diverticulosis of large intestine without perforation or abscess without bleeding: Secondary | ICD-10-CM | POA: Diagnosis not present

## 2016-08-20 DIAGNOSIS — K838 Other specified diseases of biliary tract: Secondary | ICD-10-CM | POA: Insufficient documentation

## 2016-08-20 LAB — SURGICAL PATHOLOGY

## 2016-08-20 MED ORDER — IOPAMIDOL (ISOVUE-370) INJECTION 76%
100.0000 mL | Freq: Once | INTRAVENOUS | Status: AC | PRN
  Administered 2016-08-20: 100 mL via INTRAVENOUS

## 2016-08-20 MED ORDER — IOPAMIDOL (ISOVUE-300) INJECTION 61%: 100.0000 mL | Freq: Once | INTRAVENOUS | Status: DC | PRN

## 2016-08-23 ENCOUNTER — Other Ambulatory Visit: Payer: Self-pay | Admitting: Nurse Practitioner

## 2016-08-23 DIAGNOSIS — R160 Hepatomegaly, not elsewhere classified: Secondary | ICD-10-CM

## 2016-08-23 DIAGNOSIS — R978 Other abnormal tumor markers: Secondary | ICD-10-CM | POA: Diagnosis not present

## 2016-08-23 DIAGNOSIS — R748 Abnormal levels of other serum enzymes: Secondary | ICD-10-CM | POA: Diagnosis not present

## 2016-08-24 DIAGNOSIS — J449 Chronic obstructive pulmonary disease, unspecified: Secondary | ICD-10-CM | POA: Diagnosis not present

## 2016-08-25 ENCOUNTER — Other Ambulatory Visit: Payer: Self-pay | Admitting: Nurse Practitioner

## 2016-08-25 ENCOUNTER — Ambulatory Visit
Admission: RE | Admit: 2016-08-25 | Discharge: 2016-08-25 | Disposition: A | Discharge status: Home / Self Care | Payer: PPO | Source: Ambulatory Visit | Attending: Nurse Practitioner | Admitting: Nurse Practitioner

## 2016-08-25 DIAGNOSIS — K5730 Diverticulosis of large intestine without perforation or abscess without bleeding: Secondary | ICD-10-CM | POA: Diagnosis not present

## 2016-08-25 DIAGNOSIS — R93422 Abnormal radiologic findings on diagnostic imaging of left kidney: Secondary | ICD-10-CM | POA: Insufficient documentation

## 2016-08-25 DIAGNOSIS — I700 Atherosclerosis of aorta: Secondary | ICD-10-CM | POA: Diagnosis not present

## 2016-08-25 DIAGNOSIS — R160 Hepatomegaly, not elsewhere classified: Secondary | ICD-10-CM

## 2016-08-25 DIAGNOSIS — R935 Abnormal findings on diagnostic imaging of other abdominal regions, including retroperitoneum: Secondary | ICD-10-CM | POA: Diagnosis not present

## 2016-08-25 DIAGNOSIS — J984 Other disorders of lung: Secondary | ICD-10-CM | POA: Diagnosis not present

## 2016-08-25 DIAGNOSIS — K838 Other specified diseases of biliary tract: Secondary | ICD-10-CM | POA: Diagnosis not present

## 2016-08-25 DIAGNOSIS — R978 Other abnormal tumor markers: Secondary | ICD-10-CM | POA: Diagnosis not present

## 2016-08-25 MED ORDER — GADOBENATE DIMEGLUMINE 529 MG/ML IV SOLN
15.0000 mL | Freq: Once | INTRAVENOUS | Status: AC | PRN
  Administered 2016-08-25: 15 mL via INTRAVENOUS

## 2016-08-27 DIAGNOSIS — R760 Raised antibody titer: Secondary | ICD-10-CM | POA: Diagnosis not present

## 2016-09-02 DIAGNOSIS — R160 Hepatomegaly, not elsewhere classified: Secondary | ICD-10-CM | POA: Diagnosis not present

## 2016-09-02 DIAGNOSIS — E119 Type 2 diabetes mellitus without complications: Secondary | ICD-10-CM | POA: Diagnosis not present

## 2016-09-02 DIAGNOSIS — K831 Obstruction of bile duct: Secondary | ICD-10-CM | POA: Diagnosis not present

## 2016-09-02 DIAGNOSIS — K5730 Diverticulosis of large intestine without perforation or abscess without bleeding: Secondary | ICD-10-CM | POA: Diagnosis not present

## 2016-09-02 DIAGNOSIS — K3189 Other diseases of stomach and duodenum: Secondary | ICD-10-CM | POA: Diagnosis not present

## 2016-09-02 DIAGNOSIS — K838 Other specified diseases of biliary tract: Secondary | ICD-10-CM | POA: Diagnosis not present

## 2016-09-02 DIAGNOSIS — R932 Abnormal findings on diagnostic imaging of liver and biliary tract: Secondary | ICD-10-CM | POA: Diagnosis not present

## 2016-09-02 DIAGNOSIS — K219 Gastro-esophageal reflux disease without esophagitis: Secondary | ICD-10-CM | POA: Diagnosis not present

## 2016-09-02 DIAGNOSIS — D869 Sarcoidosis, unspecified: Secondary | ICD-10-CM | POA: Diagnosis not present

## 2016-09-02 DIAGNOSIS — R748 Abnormal levels of other serum enzymes: Secondary | ICD-10-CM | POA: Diagnosis not present

## 2016-09-02 DIAGNOSIS — J449 Chronic obstructive pulmonary disease, unspecified: Secondary | ICD-10-CM | POA: Diagnosis not present

## 2016-09-02 DIAGNOSIS — K317 Polyp of stomach and duodenum: Secondary | ICD-10-CM | POA: Diagnosis not present

## 2016-09-02 DIAGNOSIS — I10 Essential (primary) hypertension: Secondary | ICD-10-CM | POA: Diagnosis not present

## 2016-09-02 DIAGNOSIS — Z4659 Encounter for fitting and adjustment of other gastrointestinal appliance and device: Secondary | ICD-10-CM | POA: Diagnosis not present

## 2016-09-02 DIAGNOSIS — M329 Systemic lupus erythematosus, unspecified: Secondary | ICD-10-CM | POA: Diagnosis not present

## 2016-09-09 DIAGNOSIS — R17 Unspecified jaundice: Secondary | ICD-10-CM | POA: Diagnosis not present

## 2016-09-09 DIAGNOSIS — R7989 Other specified abnormal findings of blood chemistry: Secondary | ICD-10-CM | POA: Diagnosis not present

## 2016-09-09 DIAGNOSIS — R160 Hepatomegaly, not elsewhere classified: Secondary | ICD-10-CM | POA: Diagnosis not present

## 2016-09-10 DIAGNOSIS — R945 Abnormal results of liver function studies: Secondary | ICD-10-CM | POA: Diagnosis not present

## 2016-09-10 DIAGNOSIS — C221 Intrahepatic bile duct carcinoma: Secondary | ICD-10-CM | POA: Diagnosis not present

## 2016-09-10 DIAGNOSIS — Z4589 Encounter for adjustment and management of other implanted devices: Secondary | ICD-10-CM | POA: Diagnosis not present

## 2016-09-10 DIAGNOSIS — Z7984 Long term (current) use of oral hypoglycemic drugs: Secondary | ICD-10-CM | POA: Diagnosis not present

## 2016-09-10 DIAGNOSIS — R932 Abnormal findings on diagnostic imaging of liver and biliary tract: Secondary | ICD-10-CM | POA: Diagnosis not present

## 2016-09-10 DIAGNOSIS — M329 Systemic lupus erythematosus, unspecified: Secondary | ICD-10-CM | POA: Diagnosis not present

## 2016-09-10 DIAGNOSIS — Z4659 Encounter for fitting and adjustment of other gastrointestinal appliance and device: Secondary | ICD-10-CM | POA: Diagnosis not present

## 2016-09-10 DIAGNOSIS — Z9689 Presence of other specified functional implants: Secondary | ICD-10-CM | POA: Diagnosis not present

## 2016-09-10 DIAGNOSIS — I10 Essential (primary) hypertension: Secondary | ICD-10-CM | POA: Diagnosis not present

## 2016-09-10 DIAGNOSIS — K831 Obstruction of bile duct: Secondary | ICD-10-CM | POA: Diagnosis not present

## 2016-09-10 DIAGNOSIS — J449 Chronic obstructive pulmonary disease, unspecified: Secondary | ICD-10-CM | POA: Diagnosis not present

## 2016-09-10 DIAGNOSIS — E119 Type 2 diabetes mellitus without complications: Secondary | ICD-10-CM | POA: Diagnosis not present

## 2016-09-10 DIAGNOSIS — R748 Abnormal levels of other serum enzymes: Secondary | ICD-10-CM | POA: Diagnosis not present

## 2016-09-14 DIAGNOSIS — R634 Abnormal weight loss: Secondary | ICD-10-CM | POA: Diagnosis not present

## 2016-09-14 DIAGNOSIS — Z87891 Personal history of nicotine dependence: Secondary | ICD-10-CM | POA: Diagnosis not present

## 2016-09-14 DIAGNOSIS — R110 Nausea: Secondary | ICD-10-CM | POA: Diagnosis not present

## 2016-09-14 DIAGNOSIS — Z6829 Body mass index (BMI) 29.0-29.9, adult: Secondary | ICD-10-CM | POA: Diagnosis not present

## 2016-09-14 DIAGNOSIS — Z9689 Presence of other specified functional implants: Secondary | ICD-10-CM | POA: Diagnosis not present

## 2016-09-14 DIAGNOSIS — R978 Other abnormal tumor markers: Secondary | ICD-10-CM | POA: Diagnosis not present

## 2016-09-14 DIAGNOSIS — R160 Hepatomegaly, not elsewhere classified: Secondary | ICD-10-CM | POA: Diagnosis not present

## 2016-09-14 DIAGNOSIS — R970 Elevated carcinoembryonic antigen [CEA]: Secondary | ICD-10-CM | POA: Diagnosis not present

## 2016-09-14 DIAGNOSIS — D490 Neoplasm of unspecified behavior of digestive system: Secondary | ICD-10-CM | POA: Diagnosis not present

## 2016-09-14 DIAGNOSIS — R8299 Other abnormal findings in urine: Secondary | ICD-10-CM | POA: Diagnosis not present

## 2016-09-14 DIAGNOSIS — K831 Obstruction of bile duct: Secondary | ICD-10-CM | POA: Diagnosis not present

## 2016-09-15 DIAGNOSIS — C221 Intrahepatic bile duct carcinoma: Secondary | ICD-10-CM | POA: Diagnosis not present

## 2016-09-15 DIAGNOSIS — K769 Liver disease, unspecified: Secondary | ICD-10-CM | POA: Diagnosis not present

## 2016-09-15 DIAGNOSIS — I81 Portal vein thrombosis: Secondary | ICD-10-CM | POA: Diagnosis not present

## 2016-09-17 DIAGNOSIS — E119 Type 2 diabetes mellitus without complications: Secondary | ICD-10-CM | POA: Insufficient documentation

## 2016-09-17 DIAGNOSIS — M329 Systemic lupus erythematosus, unspecified: Secondary | ICD-10-CM | POA: Diagnosis not present

## 2016-09-17 DIAGNOSIS — K831 Obstruction of bile duct: Secondary | ICD-10-CM | POA: Diagnosis not present

## 2016-09-17 DIAGNOSIS — F329 Major depressive disorder, single episode, unspecified: Secondary | ICD-10-CM | POA: Diagnosis not present

## 2016-09-17 DIAGNOSIS — Z87891 Personal history of nicotine dependence: Secondary | ICD-10-CM | POA: Diagnosis not present

## 2016-09-17 DIAGNOSIS — K219 Gastro-esophageal reflux disease without esophagitis: Secondary | ICD-10-CM | POA: Diagnosis not present

## 2016-09-17 DIAGNOSIS — M5136 Other intervertebral disc degeneration, lumbar region: Secondary | ICD-10-CM | POA: Diagnosis not present

## 2016-09-17 DIAGNOSIS — D509 Iron deficiency anemia, unspecified: Secondary | ICD-10-CM | POA: Diagnosis not present

## 2016-09-17 DIAGNOSIS — C19 Malignant neoplasm of rectosigmoid junction: Secondary | ICD-10-CM | POA: Diagnosis not present

## 2016-09-17 DIAGNOSIS — R918 Other nonspecific abnormal finding of lung field: Secondary | ICD-10-CM | POA: Diagnosis not present

## 2016-09-17 DIAGNOSIS — I10 Essential (primary) hypertension: Secondary | ICD-10-CM | POA: Diagnosis not present

## 2016-09-17 DIAGNOSIS — G8918 Other acute postprocedural pain: Secondary | ICD-10-CM | POA: Diagnosis not present

## 2016-09-17 DIAGNOSIS — J449 Chronic obstructive pulmonary disease, unspecified: Secondary | ICD-10-CM | POA: Diagnosis not present

## 2016-09-17 DIAGNOSIS — Z4659 Encounter for fitting and adjustment of other gastrointestinal appliance and device: Secondary | ICD-10-CM | POA: Diagnosis not present

## 2016-09-17 DIAGNOSIS — M810 Age-related osteoporosis without current pathological fracture: Secondary | ICD-10-CM | POA: Diagnosis not present

## 2016-09-17 DIAGNOSIS — R768 Other specified abnormal immunological findings in serum: Secondary | ICD-10-CM | POA: Diagnosis not present

## 2016-09-17 DIAGNOSIS — C786 Secondary malignant neoplasm of retroperitoneum and peritoneum: Secondary | ICD-10-CM | POA: Diagnosis not present

## 2016-09-17 DIAGNOSIS — C221 Intrahepatic bile duct carcinoma: Secondary | ICD-10-CM | POA: Diagnosis not present

## 2016-09-17 DIAGNOSIS — R160 Hepatomegaly, not elsewhere classified: Secondary | ICD-10-CM | POA: Diagnosis not present

## 2016-09-17 DIAGNOSIS — D869 Sarcoidosis, unspecified: Secondary | ICD-10-CM | POA: Diagnosis not present

## 2016-09-17 DIAGNOSIS — F419 Anxiety disorder, unspecified: Secondary | ICD-10-CM | POA: Diagnosis not present

## 2016-09-17 DIAGNOSIS — Z9689 Presence of other specified functional implants: Secondary | ICD-10-CM | POA: Diagnosis not present

## 2016-09-17 DIAGNOSIS — C787 Secondary malignant neoplasm of liver and intrahepatic bile duct: Secondary | ICD-10-CM | POA: Diagnosis not present

## 2016-09-17 DIAGNOSIS — K5190 Ulcerative colitis, unspecified, without complications: Secondary | ICD-10-CM | POA: Diagnosis not present

## 2016-09-17 DIAGNOSIS — L299 Pruritus, unspecified: Secondary | ICD-10-CM | POA: Diagnosis not present

## 2016-09-20 DIAGNOSIS — R768 Other specified abnormal immunological findings in serum: Secondary | ICD-10-CM | POA: Diagnosis not present

## 2016-09-20 DIAGNOSIS — C786 Secondary malignant neoplasm of retroperitoneum and peritoneum: Secondary | ICD-10-CM | POA: Diagnosis not present

## 2016-09-22 DIAGNOSIS — R768 Other specified abnormal immunological findings in serum: Secondary | ICD-10-CM | POA: Insufficient documentation

## 2016-09-28 DIAGNOSIS — K5190 Ulcerative colitis, unspecified, without complications: Secondary | ICD-10-CM | POA: Diagnosis not present

## 2016-09-28 DIAGNOSIS — R011 Cardiac murmur, unspecified: Secondary | ICD-10-CM | POA: Diagnosis not present

## 2016-09-28 DIAGNOSIS — K831 Obstruction of bile duct: Secondary | ICD-10-CM | POA: Diagnosis not present

## 2016-09-28 DIAGNOSIS — C786 Secondary malignant neoplasm of retroperitoneum and peritoneum: Secondary | ICD-10-CM | POA: Diagnosis not present

## 2016-09-28 DIAGNOSIS — R768 Other specified abnormal immunological findings in serum: Secondary | ICD-10-CM | POA: Diagnosis not present

## 2016-09-28 DIAGNOSIS — C249 Malignant neoplasm of biliary tract, unspecified: Secondary | ICD-10-CM | POA: Diagnosis not present

## 2016-09-28 DIAGNOSIS — Y848 Other medical procedures as the cause of abnormal reaction of the patient, or of later complication, without mention of misadventure at the time of the procedure: Secondary | ICD-10-CM | POA: Diagnosis not present

## 2016-09-28 DIAGNOSIS — Z87891 Personal history of nicotine dependence: Secondary | ICD-10-CM | POA: Diagnosis not present

## 2016-09-28 DIAGNOSIS — E876 Hypokalemia: Secondary | ICD-10-CM | POA: Diagnosis not present

## 2016-09-28 DIAGNOSIS — T814XXA Infection following a procedure, initial encounter: Secondary | ICD-10-CM | POA: Diagnosis not present

## 2016-09-28 DIAGNOSIS — I10 Essential (primary) hypertension: Secondary | ICD-10-CM | POA: Diagnosis not present

## 2016-09-28 DIAGNOSIS — E119 Type 2 diabetes mellitus without complications: Secondary | ICD-10-CM | POA: Diagnosis not present

## 2016-09-28 DIAGNOSIS — K830 Cholangitis: Secondary | ICD-10-CM | POA: Diagnosis not present

## 2016-09-28 DIAGNOSIS — R110 Nausea: Secondary | ICD-10-CM | POA: Diagnosis not present

## 2016-09-28 DIAGNOSIS — R140 Abdominal distension (gaseous): Secondary | ICD-10-CM | POA: Diagnosis not present

## 2016-09-28 DIAGNOSIS — R1084 Generalized abdominal pain: Secondary | ICD-10-CM | POA: Diagnosis not present

## 2016-09-28 DIAGNOSIS — R51 Headache: Secondary | ICD-10-CM | POA: Diagnosis not present

## 2016-09-28 DIAGNOSIS — K5903 Drug induced constipation: Secondary | ICD-10-CM | POA: Diagnosis not present

## 2016-09-28 DIAGNOSIS — R0902 Hypoxemia: Secondary | ICD-10-CM | POA: Diagnosis not present

## 2016-09-28 DIAGNOSIS — J810 Acute pulmonary edema: Secondary | ICD-10-CM | POA: Diagnosis not present

## 2016-09-28 DIAGNOSIS — J439 Emphysema, unspecified: Secondary | ICD-10-CM | POA: Diagnosis not present

## 2016-09-28 DIAGNOSIS — K750 Abscess of liver: Secondary | ICD-10-CM | POA: Diagnosis not present

## 2016-09-28 DIAGNOSIS — R918 Other nonspecific abnormal finding of lung field: Secondary | ICD-10-CM | POA: Diagnosis not present

## 2016-09-28 DIAGNOSIS — T402X5A Adverse effect of other opioids, initial encounter: Secondary | ICD-10-CM | POA: Diagnosis not present

## 2016-09-28 DIAGNOSIS — A419 Sepsis, unspecified organism: Secondary | ICD-10-CM | POA: Diagnosis not present

## 2016-09-28 DIAGNOSIS — R600 Localized edema: Secondary | ICD-10-CM | POA: Diagnosis not present

## 2016-09-28 DIAGNOSIS — M329 Systemic lupus erythematosus, unspecified: Secondary | ICD-10-CM | POA: Diagnosis not present

## 2016-09-28 DIAGNOSIS — J8410 Pulmonary fibrosis, unspecified: Secondary | ICD-10-CM | POA: Diagnosis not present

## 2016-09-28 DIAGNOSIS — D869 Sarcoidosis, unspecified: Secondary | ICD-10-CM | POA: Diagnosis not present

## 2016-09-28 DIAGNOSIS — Z9889 Other specified postprocedural states: Secondary | ICD-10-CM | POA: Diagnosis not present

## 2016-09-28 DIAGNOSIS — Z9189 Other specified personal risk factors, not elsewhere classified: Secondary | ICD-10-CM | POA: Diagnosis not present

## 2016-09-28 DIAGNOSIS — C221 Intrahepatic bile duct carcinoma: Secondary | ICD-10-CM | POA: Diagnosis not present

## 2016-09-28 DIAGNOSIS — R509 Fever, unspecified: Secondary | ICD-10-CM | POA: Diagnosis not present

## 2016-09-29 DIAGNOSIS — Z9189 Other specified personal risk factors, not elsewhere classified: Secondary | ICD-10-CM | POA: Insufficient documentation

## 2016-10-12 DIAGNOSIS — Z515 Encounter for palliative care: Secondary | ICD-10-CM | POA: Diagnosis not present

## 2016-10-12 DIAGNOSIS — C249 Malignant neoplasm of biliary tract, unspecified: Secondary | ICD-10-CM | POA: Diagnosis not present

## 2016-10-12 DIAGNOSIS — Z5111 Encounter for antineoplastic chemotherapy: Secondary | ICD-10-CM | POA: Diagnosis not present

## 2016-10-12 DIAGNOSIS — Z79899 Other long term (current) drug therapy: Secondary | ICD-10-CM | POA: Diagnosis not present

## 2016-10-19 DIAGNOSIS — E876 Hypokalemia: Secondary | ICD-10-CM | POA: Diagnosis not present

## 2016-10-19 DIAGNOSIS — Z5111 Encounter for antineoplastic chemotherapy: Secondary | ICD-10-CM | POA: Diagnosis not present

## 2016-10-19 DIAGNOSIS — R5383 Other fatigue: Secondary | ICD-10-CM | POA: Diagnosis not present

## 2016-10-19 DIAGNOSIS — Z515 Encounter for palliative care: Secondary | ICD-10-CM | POA: Diagnosis not present

## 2016-10-19 DIAGNOSIS — M7989 Other specified soft tissue disorders: Secondary | ICD-10-CM | POA: Diagnosis not present

## 2016-10-19 DIAGNOSIS — M79661 Pain in right lower leg: Secondary | ICD-10-CM | POA: Diagnosis not present

## 2016-10-19 DIAGNOSIS — C249 Malignant neoplasm of biliary tract, unspecified: Secondary | ICD-10-CM | POA: Diagnosis not present

## 2016-10-24 DIAGNOSIS — J449 Chronic obstructive pulmonary disease, unspecified: Secondary | ICD-10-CM | POA: Diagnosis not present

## 2016-10-26 DIAGNOSIS — R634 Abnormal weight loss: Secondary | ICD-10-CM | POA: Diagnosis not present

## 2016-10-26 DIAGNOSIS — E876 Hypokalemia: Secondary | ICD-10-CM | POA: Diagnosis not present

## 2016-10-26 DIAGNOSIS — Z5111 Encounter for antineoplastic chemotherapy: Secondary | ICD-10-CM | POA: Diagnosis not present

## 2016-10-26 DIAGNOSIS — M7989 Other specified soft tissue disorders: Secondary | ICD-10-CM | POA: Diagnosis not present

## 2016-10-26 DIAGNOSIS — C221 Intrahepatic bile duct carcinoma: Secondary | ICD-10-CM | POA: Diagnosis not present

## 2016-10-26 DIAGNOSIS — C249 Malignant neoplasm of biliary tract, unspecified: Secondary | ICD-10-CM | POA: Diagnosis not present

## 2016-10-26 DIAGNOSIS — M79661 Pain in right lower leg: Secondary | ICD-10-CM | POA: Diagnosis not present

## 2016-10-26 DIAGNOSIS — Z515 Encounter for palliative care: Secondary | ICD-10-CM | POA: Diagnosis not present

## 2016-11-02 DIAGNOSIS — E876 Hypokalemia: Secondary | ICD-10-CM | POA: Diagnosis not present

## 2016-11-02 DIAGNOSIS — Z5111 Encounter for antineoplastic chemotherapy: Secondary | ICD-10-CM | POA: Diagnosis not present

## 2016-11-02 DIAGNOSIS — Z515 Encounter for palliative care: Secondary | ICD-10-CM | POA: Diagnosis not present

## 2016-11-02 DIAGNOSIS — C249 Malignant neoplasm of biliary tract, unspecified: Secondary | ICD-10-CM | POA: Diagnosis not present

## 2016-11-02 DIAGNOSIS — R768 Other specified abnormal immunological findings in serum: Secondary | ICD-10-CM | POA: Diagnosis not present

## 2016-11-02 DIAGNOSIS — T451X5A Adverse effect of antineoplastic and immunosuppressive drugs, initial encounter: Secondary | ICD-10-CM | POA: Diagnosis not present

## 2016-11-02 DIAGNOSIS — C221 Intrahepatic bile duct carcinoma: Secondary | ICD-10-CM | POA: Diagnosis not present

## 2016-11-02 DIAGNOSIS — D6481 Anemia due to antineoplastic chemotherapy: Secondary | ICD-10-CM | POA: Diagnosis not present

## 2016-11-13 ENCOUNTER — Emergency Department: Payer: PPO

## 2016-11-13 ENCOUNTER — Other Ambulatory Visit: Payer: Self-pay

## 2016-11-13 ENCOUNTER — Observation Stay
Admission: EM | Admit: 2016-11-13 | Discharge: 2016-11-14 | Disposition: A | Discharge status: Home / Self Care | Payer: PPO | Attending: Internal Medicine | Admitting: Internal Medicine

## 2016-11-13 ENCOUNTER — Encounter: Payer: Self-pay | Admitting: Emergency Medicine

## 2016-11-13 DIAGNOSIS — Z87891 Personal history of nicotine dependence: Secondary | ICD-10-CM | POA: Insufficient documentation

## 2016-11-13 DIAGNOSIS — E871 Hypo-osmolality and hyponatremia: Secondary | ICD-10-CM | POA: Insufficient documentation

## 2016-11-13 DIAGNOSIS — W06XXXA Fall from bed, initial encounter: Secondary | ICD-10-CM | POA: Diagnosis not present

## 2016-11-13 DIAGNOSIS — E119 Type 2 diabetes mellitus without complications: Secondary | ICD-10-CM | POA: Diagnosis not present

## 2016-11-13 DIAGNOSIS — Z66 Do not resuscitate: Secondary | ICD-10-CM | POA: Insufficient documentation

## 2016-11-13 DIAGNOSIS — K219 Gastro-esophageal reflux disease without esophagitis: Secondary | ICD-10-CM | POA: Diagnosis not present

## 2016-11-13 DIAGNOSIS — F329 Major depressive disorder, single episode, unspecified: Secondary | ICD-10-CM | POA: Insufficient documentation

## 2016-11-13 DIAGNOSIS — M329 Systemic lupus erythematosus, unspecified: Secondary | ICD-10-CM | POA: Diagnosis not present

## 2016-11-13 DIAGNOSIS — Z79899 Other long term (current) drug therapy: Secondary | ICD-10-CM | POA: Diagnosis not present

## 2016-11-13 DIAGNOSIS — I10 Essential (primary) hypertension: Secondary | ICD-10-CM | POA: Diagnosis not present

## 2016-11-13 DIAGNOSIS — A419 Sepsis, unspecified organism: Principal | ICD-10-CM | POA: Insufficient documentation

## 2016-11-13 DIAGNOSIS — Z79890 Hormone replacement therapy (postmenopausal): Secondary | ICD-10-CM | POA: Insufficient documentation

## 2016-11-13 DIAGNOSIS — Z79891 Long term (current) use of opiate analgesic: Secondary | ICD-10-CM | POA: Diagnosis not present

## 2016-11-13 DIAGNOSIS — J449 Chronic obstructive pulmonary disease, unspecified: Secondary | ICD-10-CM | POA: Diagnosis not present

## 2016-11-13 DIAGNOSIS — S0101XA Laceration without foreign body of scalp, initial encounter: Secondary | ICD-10-CM | POA: Diagnosis not present

## 2016-11-13 DIAGNOSIS — S0191XA Laceration without foreign body of unspecified part of head, initial encounter: Secondary | ICD-10-CM | POA: Diagnosis not present

## 2016-11-13 DIAGNOSIS — Z7984 Long term (current) use of oral hypoglycemic drugs: Secondary | ICD-10-CM | POA: Diagnosis not present

## 2016-11-13 DIAGNOSIS — S299XXA Unspecified injury of thorax, initial encounter: Secondary | ICD-10-CM | POA: Diagnosis not present

## 2016-11-13 DIAGNOSIS — S199XXA Unspecified injury of neck, initial encounter: Secondary | ICD-10-CM | POA: Diagnosis not present

## 2016-11-13 DIAGNOSIS — R6520 Severe sepsis without septic shock: Secondary | ICD-10-CM

## 2016-11-13 HISTORY — DX: Malignant (primary) neoplasm, unspecified: C80.1

## 2016-11-13 LAB — COMPREHENSIVE METABOLIC PANEL
ALT: 41 U/L (ref 14–54)
AST: 53 U/L — AB (ref 15–41)
Albumin: 3.1 g/dL — ABNORMAL LOW (ref 3.5–5.0)
Alkaline Phosphatase: 265 U/L — ABNORMAL HIGH (ref 38–126)
Anion gap: 11 (ref 5–15)
BUN: 11 mg/dL (ref 6–20)
CHLORIDE: 100 mmol/L — AB (ref 101–111)
CO2: 22 mmol/L (ref 22–32)
CREATININE: 0.64 mg/dL (ref 0.44–1.00)
Calcium: 7.9 mg/dL — ABNORMAL LOW (ref 8.9–10.3)
GFR calc non Af Amer: >60 mL/min (ref >60)
Glucose, Bld: 132 mg/dL — ABNORMAL HIGH (ref 65–99)
POTASSIUM: 3.5 mmol/L (ref 3.5–5.1)
SODIUM: 133 mmol/L — AB (ref 135–145)
Total Bilirubin: 0.8 mg/dL (ref 0.3–1.2)
Total Protein: 7.2 g/dL (ref 6.5–8.1)

## 2016-11-13 LAB — CBC WITH DIFFERENTIAL/PLATELET
BASOS ABS: 0 10*3/uL (ref 0–0.1)
BASOS PCT: 0 %
Eosinophils Absolute: 0 10*3/uL (ref 0–0.7)
Eosinophils Relative: 0 %
HEMATOCRIT: 27.9 % — AB (ref 35.0–47.0)
HEMOGLOBIN: 9.2 g/dL — AB (ref 12.0–16.0)
LYMPHS PCT: 17 %
Lymphs Abs: 1.1 10*3/uL (ref 1.0–3.6)
MCH: 26.9 pg (ref 26.0–34.0)
MCHC: 32.9 g/dL (ref 32.0–36.0)
MCV: 81.6 fL (ref 80.0–100.0)
MONO ABS: 1 10*3/uL — AB (ref 0.2–0.9)
Monocytes Relative: 15 %
NEUTROS ABS: 4.7 10*3/uL (ref 1.4–6.5)
NEUTROS PCT: 68 %
Platelets: 216 10*3/uL (ref 150–440)
RBC: 3.41 MIL/uL — ABNORMAL LOW (ref 3.80–5.20)
RDW: 17.7 % — AB (ref 11.5–14.5)
WBC: 6.9 10*3/uL (ref 3.6–11.0)

## 2016-11-13 LAB — URINALYSIS, COMPLETE (UACMP) WITH MICROSCOPIC
Bacteria, UA: NONE SEEN
Bilirubin Urine: NEGATIVE
Glucose, UA: NEGATIVE mg/dL
Ketones, ur: NEGATIVE mg/dL
Leukocytes, UA: NEGATIVE
Nitrite: NEGATIVE
Protein, ur: NEGATIVE mg/dL
Specific Gravity, Urine: 1.006 (ref 1.005–1.030)
pH: 6 (ref 5.0–8.0)

## 2016-11-13 LAB — LACTIC ACID, PLASMA
Lactic Acid, Venous: 1.5 mmol/L (ref 0.5–1.9)
Lactic Acid, Venous: 2.6 mmol/L (ref 0.5–1.9)
Lactic Acid, Venous: 3 mmol/L (ref 0.5–1.9)

## 2016-11-13 LAB — GLUCOSE, CAPILLARY
Glucose-Capillary: 79 mg/dL (ref 65–99)
Glucose-Capillary: 104 mg/dL — ABNORMAL HIGH (ref 65–99)
Glucose-Capillary: 73 mg/dL (ref 65–99)
Glucose-Capillary: 100 mg/dL — ABNORMAL HIGH (ref 65–99)

## 2016-11-13 LAB — PROTIME-INR
INR: 1.18
Prothrombin Time: 15.1 seconds (ref 11.4–15.2)

## 2016-11-13 LAB — TSH: TSH: 2.235 u[IU]/mL (ref 0.350–4.500)

## 2016-11-13 MED ORDER — ACETAMINOPHEN 650 MG RE SUPP: 650.0000 mg | Freq: Four times a day (QID) | RECTAL | Status: DC | PRN

## 2016-11-13 MED ORDER — VANCOMYCIN HCL IN DEXTROSE 1-5 GM/200ML-% IV SOLN
1000.0000 mg | Freq: Once | INTRAVENOUS | Status: AC
  Administered 2016-11-13: 1000 mg via INTRAVENOUS
  Filled 2016-11-13: qty 200

## 2016-11-13 MED ORDER — PIPERACILLIN-TAZOBACTAM 3.375 G IVPB 30 MIN
INTRAVENOUS | Status: AC
  Administered 2016-11-13: 3.375 g via INTRAVENOUS
  Filled 2016-11-13: qty 50

## 2016-11-13 MED ORDER — PIPERACILLIN-TAZOBACTAM 3.375 G IVPB
3.3750 g | Freq: Three times a day (TID) | INTRAVENOUS | Status: DC
  Administered 2016-11-13 – 2016-11-14 (×3): 3.375 g via INTRAVENOUS
  Filled 2016-11-13 (×3): qty 50

## 2016-11-13 MED ORDER — PANTOPRAZOLE SODIUM 40 MG PO TBEC
40.0000 mg | DELAYED_RELEASE_TABLET | Freq: Every day | ORAL | Status: DC
  Administered 2016-11-13 – 2016-11-14 (×2): 40 mg via ORAL
  Filled 2016-11-13 (×2): qty 1

## 2016-11-13 MED ORDER — PIPERACILLIN-TAZOBACTAM 3.375 G IVPB 30 MIN: 3.3750 g | Freq: Once | INTRAVENOUS | Status: DC

## 2016-11-13 MED ORDER — OXYCODONE HCL 5 MG PO TABS
5.0000 mg | ORAL_TABLET | ORAL | Status: DC | PRN
  Filled 2016-11-13: qty 1

## 2016-11-13 MED ORDER — ENOXAPARIN SODIUM 40 MG/0.4ML ~~LOC~~ SOLN
40.0000 mg | SUBCUTANEOUS | Status: DC
  Administered 2016-11-13: 40 mg via SUBCUTANEOUS
  Filled 2016-11-13: qty 0.4

## 2016-11-13 MED ORDER — VANCOMYCIN HCL IN DEXTROSE 1-5 GM/200ML-% IV SOLN
1000.0000 mg | INTRAVENOUS | Status: DC
  Administered 2016-11-13: 13:00:00 1000 mg via INTRAVENOUS
  Filled 2016-11-13 (×3): qty 200

## 2016-11-13 MED ORDER — INSULIN ASPART 100 UNIT/ML ~~LOC~~ SOLN: 0.0000 [IU] | Freq: Three times a day (TID) | SUBCUTANEOUS | Status: DC

## 2016-11-13 MED ORDER — MORPHINE SULFATE ER 15 MG PO TBCR
15.0000 mg | EXTENDED_RELEASE_TABLET | Freq: Every day | ORAL | Status: DC
  Administered 2016-11-13: 15 mg via ORAL
  Filled 2016-11-13: qty 1

## 2016-11-13 MED ORDER — GABAPENTIN 100 MG PO CAPS
100.0000 mg | ORAL_CAPSULE | Freq: Two times a day (BID) | ORAL | Status: DC
  Administered 2016-11-13 – 2016-11-14 (×3): 100 mg via ORAL
  Filled 2016-11-13 (×3): qty 1

## 2016-11-13 MED ORDER — POTASSIUM CHLORIDE CRYS ER 20 MEQ PO TBCR
20.0000 meq | EXTENDED_RELEASE_TABLET | Freq: Every day | ORAL | Status: DC
  Administered 2016-11-13 – 2016-11-14 (×2): 20 meq via ORAL
  Filled 2016-11-13 (×2): qty 1

## 2016-11-13 MED ORDER — DOCUSATE SODIUM 100 MG PO CAPS
100.0000 mg | ORAL_CAPSULE | Freq: Two times a day (BID) | ORAL | Status: DC
  Administered 2016-11-13 (×2): 100 mg via ORAL
  Filled 2016-11-13 (×3): qty 1

## 2016-11-13 MED ORDER — ESTRADIOL 1 MG PO TABS
1.0000 mg | ORAL_TABLET | Freq: Every day | ORAL | Status: DC
Start: 2016-11-13 — End: 2016-11-14
  Administered 2016-11-13 – 2016-11-14 (×2): 1 mg via ORAL
  Filled 2016-11-13 (×2): qty 1

## 2016-11-13 MED ORDER — MORPHINE SULFATE (PF) 2 MG/ML IV SOLN: 2.0000 mg | INTRAVENOUS | Status: DC | PRN

## 2016-11-13 MED ORDER — INSULIN ASPART 100 UNIT/ML ~~LOC~~ SOLN: 0.0000 [IU] | Freq: Every day | SUBCUTANEOUS | Status: DC

## 2016-11-13 MED ORDER — HYDROXYCHLOROQUINE SULFATE 200 MG PO TABS
200.0000 mg | ORAL_TABLET | Freq: Two times a day (BID) | ORAL | Status: DC
  Filled 2016-11-13 (×4): qty 1

## 2016-11-13 MED ORDER — ONDANSETRON HCL 4 MG/2ML IJ SOLN: 4.0000 mg | Freq: Four times a day (QID) | INTRAMUSCULAR | Status: DC | PRN

## 2016-11-13 MED ORDER — MOMETASONE FURO-FORMOTEROL FUM 200-5 MCG/ACT IN AERO
2.0000 | INHALATION_SPRAY | Freq: Two times a day (BID) | RESPIRATORY_TRACT | Status: DC
  Filled 2016-11-13: qty 8.8

## 2016-11-13 MED ORDER — AMLODIPINE BESYLATE 5 MG PO TABS
5.0000 mg | ORAL_TABLET | Freq: Every day | ORAL | Status: DC
  Administered 2016-11-13 – 2016-11-14 (×2): 5 mg via ORAL
  Filled 2016-11-13 (×2): qty 1

## 2016-11-13 MED ORDER — ONDANSETRON HCL 4 MG PO TABS
4.0000 mg | ORAL_TABLET | Freq: Four times a day (QID) | ORAL | Status: DC | PRN
  Administered 2016-11-13: 22:00:00 4 mg via ORAL
  Filled 2016-11-13: qty 1

## 2016-11-13 MED ORDER — LIDOCAINE HCL (PF) 1 % IJ SOLN
INTRAMUSCULAR | Status: AC
  Administered 2016-11-13: 5 mL via INTRADERMAL
  Filled 2016-11-13: qty 5

## 2016-11-13 MED ORDER — VENLAFAXINE HCL ER 75 MG PO CP24
225.0000 mg | ORAL_CAPSULE | Freq: Every day | ORAL | Status: DC
  Administered 2016-11-13 – 2016-11-14 (×2): 225 mg via ORAL
  Filled 2016-11-13 (×2): qty 3

## 2016-11-13 MED ORDER — TRAMADOL HCL 50 MG PO TABS
25.0000 mg | ORAL_TABLET | Freq: Two times a day (BID) | ORAL | Status: DC
  Administered 2016-11-13 – 2016-11-14 (×3): 25 mg via ORAL
  Filled 2016-11-13 (×3): qty 1

## 2016-11-13 MED ORDER — ACETAMINOPHEN 325 MG PO TABS
650.0000 mg | ORAL_TABLET | Freq: Four times a day (QID) | ORAL | Status: DC | PRN
  Administered 2016-11-13 – 2016-11-14 (×4): 650 mg via ORAL
  Filled 2016-11-13 (×4): qty 2

## 2016-11-13 MED ORDER — LIDOCAINE HCL (PF) 1 % IJ SOLN
5.0000 mL | Freq: Once | INTRAMUSCULAR | Status: AC
  Administered 2016-11-13: 5 mL via INTRADERMAL

## 2016-11-13 MED ORDER — SODIUM CHLORIDE 0.9 % IV SOLN
INTRAVENOUS | Status: DC
  Administered 2016-11-13 – 2016-11-14 (×5): via INTRAVENOUS

## 2016-11-13 MED ORDER — SODIUM CHLORIDE 0.9 % IV BOLUS (SEPSIS)
500.0000 mL | Freq: Once | INTRAVENOUS | Status: AC
  Administered 2016-11-13: 500 mL via INTRAVENOUS

## 2016-11-13 MED ORDER — SENNOSIDES-DOCUSATE SODIUM 8.6-50 MG PO TABS
1.0000 | ORAL_TABLET | Freq: Every day | ORAL | Status: DC
  Administered 2016-11-13: 10:00:00 1 via ORAL
  Filled 2016-11-13 (×2): qty 1

## 2016-11-13 NOTE — ED Notes (Signed)
Patient transported to CT 

## 2016-11-13 NOTE — Progress Notes (Signed)
CRITICAL VALUE ALERT  Critical Value: 3.0 Lactic acid  Date & Time Notied:  11/13/16 @ 7619  Provider Notified: Dr Marcille Blanco  Orders Received/Actions taken:

## 2016-11-13 NOTE — ED Triage Notes (Signed)
Pt to triage via [place], report tripped and fell, laceration noted to back of head, dressed in triage.  VS reveal pt w/ 100.5 temp and tachycardic.  PT denies any recent illness.  Pt report hx liver cancer, last chemo 2 weeks ago.

## 2016-11-13 NOTE — H&P (Signed)
Regina Hood is an 76 y.o. female.  Chief Complaint: Fall HPI: The patient with past medical history of cholangiocarcinoma, hypertension, diabetes, lupus and COPD presents to the emergency department after a fall. The patient states that she was climbing onto her bed when she takes possibly fell backward. The patient denies loss of consciousness before or after the fall. She lacerated the occipital area of her scalp which required 5 staples for closure. CT of her head showed no acute intracranial process. In the emergency department she was found to be febrile with tachycardia and tachypnea. Code sepsis was initiated and the patient received broad-spectrum antibiotics after blood cultures and 30 mL/kg of fluid resuscitation. The patient denies cough or dysuria. After the patient was stabilized emergency department staff called the hospitalist service for admission.  Past Medical History:  Diagnosis Date  . Arthritis   . Cancer ([place] [facility])   . Cellulitis   . COPD (chronic obstructive pulmonary disease) ([place])   . DDD (degenerative disc disease), lumbar   . Depression   . Diabetes mellitus   . Diverticulitis   . GERD (gastroesophageal reflux disease)   . GI bleed   . Headache(784.0)   . Hypertension   . Lupus   . Ulcerative colitis [facility])     Past Surgical History:  Procedure Laterality Date  . ABDOMINAL HYSTERECTOMY    . BREAST BIOPSY Right   . COLONOSCOPY    . COLONOSCOPY WITH PROPOFOL N/A 08/18/2016   Procedure: COLONOSCOPY WITH PROPOFOL;  Surgeon: Manya Silvas, MD;  Location[facility] ENDOSCOPY;  Service: Endoscopy;  Laterality: N/A;  . ESOPHAGOGASTRODUODENOSCOPY (EGD) WITH PROPOFOL N/A 08/18/2016   Procedure: ESOPHAGOGASTRODUODENOSCOPY (EGD) WITH PROPOFOL;  Surgeon: Manya Silvas, MD;  Location: [facility] ENDOSCOPY;  Service: Endoscopy;  Laterality: N/A;  . ORIF HUMERUS FRACTURE  09/17/2011   Procedure: OPEN REDUCTION INTERNAL FIXATION (ORIF) PROXIMAL HUMERUS FRACTURE;  Surgeon: Augustin Schooling,  MD;  Location: [facility];  Service: Orthopedics;  Laterality: Left;    Family History  Problem Relation Age of Onset  . Breast cancer Mother 18   Social History:  reports that she has quit smoking. Her smoking use included Cigarettes. She has a 10.00 pack-year smoking history. She has never used smokeless tobacco. She reports that she does not drink alcohol or use drugs.  Allergies:  Allergies  Allergen Reactions  . Feldene [Piroxicam]   . Penicillamine Other (See Comments)  . Qualaquin [Quinine]     Medications Prior to Admission  Medication Sig Dispense Refill  . acetaminophen (TYLENOL) 500 MG tablet Take 1,000 mg by mouth 3 (three) times daily.    Marland Kitchen amLODipine (NORVASC) 5 MG tablet Take 5 mg by mouth daily.    . [employer] STOOL SOFTENER 8.6-50 MG tablet Take 1 tablet by mouth daily.  11  . estradiol (ESTRACE) 1 MG tablet Take 1 mg by mouth daily.    Marland Kitchen gabapentin (NEURONTIN) 100 MG capsule Take 100 mg by mouth 3 (three) times daily.     Marland Kitchen KLOR-CON 10 10 MEQ tablet Take 2 tablets by mouth daily.  0  . metFORMIN (GLUCOPHAGE) 500 MG tablet Take 500 mg by mouth 2 (two) times daily with a meal.    . morphine (MS CONTIN) 15 MG 12 hr tablet Take 15 mg by mouth at bedtime.  0  . ondansetron (ZOFRAN) 8 MG tablet Take 8 mg by mouth every 12 (twelve) hours as needed for nausea/vomiting.  2  . oxyCODONE (OXY IR/ROXICODONE) 5 MG immediate release  tablet Take 5 mg by mouth every 4 (four) hours as needed for pain.  0  . pantoprazole (PROTONIX) 40 MG tablet Take 40 mg by mouth daily.    . Sodium Chloride Flush (NORMAL SALINE FLUSH) 0.9 % SOLN 1 mL by Other route 2 (two) times daily. Flush each biliary drain with 1 mL twice daily  0  . traMADol (ULTRAM) 50 MG tablet Take 50 mg by mouth 2 (two) times daily.     Marland Kitchen venlafaxine XR (EFFEXOR-XR) 75 MG 24 hr capsule Take 225 mg by mouth daily.       Results for orders placed or performed during the hospital encounter of 11/13/16 (from the past 48 hour(s))   Comprehensive metabolic panel     Status: Abnormal   Collection Time: 11/13/16 12:19 AM  Result Value Ref Range   Sodium 133 (L) 135 - 145 mmol/L   Potassium 3.5 3.5 - 5.1 mmol/L   Chloride 100 (L) 101 - 111 mmol/L   CO2 22 22 - 32 mmol/L   Glucose, Bld 132 (H) 65 - 99 mg/dL   BUN 11 6 - 20 mg/dL   Creatinine, Ser 0.64 0.44 - 1.00 mg/dL   Calcium 7.9 (L) 8.9 - 10.3 mg/dL   Total Protein 7.2 6.5 - 8.1 g/dL   Albumin 3.1 (L) 3.5 - 5.0 g/dL   AST 53 (H) 15 - 41 U/L   ALT 41 14 - 54 U/L   Alkaline Phosphatase 265 (H) 38 - 126 U/L   Total Bilirubin 0.8 0.3 - 1.2 mg/dL   GFR calc non Af Amer >60 >60 mL/min   GFR calc Af Amer >60 >60 mL/min    Comment: (NOTE) The eGFR has been calculated using the CKD EPI equation. This calculation has not been validated in all clinical situations. eGFR's persistently <60 mL/min signify possible Chronic Kidney Disease.    Anion gap 11 5 - 15  Lactic acid, plasma     Status: Abnormal   Collection Time: 11/13/16 12:19 AM  Result Value Ref Range   Lactic Acid, Venous 2.6 (HH) 0.5 - 1.9 mmol/L    Comment: CRITICAL RESULT CALLED TO, READ BACK BY AND VERIFIED WITH KENDALL MOFFITT AT 0100 11/13/16.PMH  CBC with Differential     Status: Abnormal   Collection Time: 11/13/16 12:19 AM  Result Value Ref Range   WBC 6.9 3.6 - 11.0 K/uL   RBC 3.41 (L) 3.80 - 5.20 MIL/uL   Hemoglobin 9.2 (L) 12.0 - 16.0 g/dL   HCT 27.9 (L) 35.0 - 47.0 %   MCV 81.6 80.0 - 100.0 fL   MCH 26.9 26.0 - 34.0 pg   MCHC 32.9 32.0 - 36.0 g/dL   RDW 17.7 (H) 11.5 - 14.5 %   Platelets 216 150 - 440 K/uL   Neutrophils Relative % 68 %   Neutro Abs 4.7 1.4 - 6.5 K/uL   Lymphocytes Relative 17 %   Lymphs Abs 1.1 1.0 - 3.6 K/uL   Monocytes Relative 15 %   Monocytes Absolute 1.0 (H) 0.2 - 0.9 K/uL   Eosinophils Relative 0 %   Eosinophils Absolute 0.0 0 - 0.7 K/uL   Basophils Relative 0 %   Basophils Absolute 0.0 0 - 0.1 K/uL  Protime-INR     Status: None   Collection Time: 11/13/16  12:19 AM  Result Value Ref Range   Prothrombin Time 15.1 11.4 - 15.2 seconds   INR 1.18   Lactic acid, plasma     Status:  Abnormal   Collection Time: 11/13/16  3:14 AM  Result Value Ref Range   Lactic Acid, Venous 3.0 (HH) 0.5 - 1.9 mmol/L    Comment: CRITICAL RESULT CALLED TO, READ BACK BY AND VERIFIED WITH MELISSA COBB AT 3614 11/13/16.PMH  TSH     Status: None   Collection Time: 11/13/16  3:14 AM  Result Value Ref Range   TSH 2.235 0.350 - 4.500 uIU/mL    Comment: Performed by a 3rd Generation assay with a functional sensitivity of <=0.01 uIU/mL.  Urinalysis, Complete w Microscopic     Status: Abnormal   Collection Time: 11/13/16  4:14 AM  Result Value Ref Range   Color, Urine YELLOW (A) YELLOW   APPearance CLEAR (A) CLEAR   Specific Gravity, Urine 1.006 1.005 - 1.030   pH 6.0 5.0 - 8.0   Glucose, UA NEGATIVE NEGATIVE mg/dL   Hgb urine dipstick SMALL (A) NEGATIVE   Bilirubin Urine NEGATIVE NEGATIVE   Ketones, ur NEGATIVE NEGATIVE mg/dL   Protein, ur NEGATIVE NEGATIVE mg/dL   Nitrite NEGATIVE NEGATIVE   Leukocytes, UA NEGATIVE NEGATIVE   RBC / HPF 0-5 0 - 5 RBC/hpf   WBC, UA 0-5 0 - 5 WBC/hpf   Bacteria, UA NONE SEEN NONE SEEN   Squamous Epithelial / LPF 0-5 (A) NONE SEEN   Amorphous Crystal PRESENT    Dg Chest 2 View  Result Date: 11/13/2016 CLINICAL DATA:  Trip and fall injury. Laceration to the back of the head. History of liver cancer post chemotherapy 2 weeks ago. COPD. Diabetes. Gastroesophageal reflux disease. Hypertension. Former smoker. EXAM: CHEST  2 VIEW COMPARISON:  CT chest 08/29/2012.  Chest 09/17/2011. FINDINGS: Right central venous power port type catheter with tip over the mid SVC region. No pneumothorax. Drainage catheters in the upper abdomen. These likely represent biliary drains. Surgical clips in the right lung. Postoperative changes in the left shoulder. Borderline heart size and pulmonary vascularity are likely normal for technique. Interstitial changes in  the lung bases consistent with fibrosis. No airspace disease or consolidation in the lungs. Scattered calcified granulomas. No blunting of costophrenic angles. No pneumothorax. Degenerative changes in the spine. IMPRESSION: Fibrosis in the lungs likely related to history of sarcoidosis. No consolidation or airspace disease. Electronically Signed   By: Lucienne Capers M.D.   On: 11/13/2016 01:22   Ct Head Wo Contrast  Result Date: 11/13/2016 CLINICAL DATA:  Trip and fall injury. Laceration to the back of the head. History of liver cancer with chemotherapy 2 weeks ago. EXAM: CT HEAD WITHOUT CONTRAST CT CERVICAL SPINE WITHOUT CONTRAST TECHNIQUE: Multidetector CT imaging of the head and cervical spine was performed following the standard protocol without intravenous contrast. Multiplanar CT image reconstructions of the cervical spine were also generated. COMPARISON:  None. FINDINGS: CT HEAD FINDINGS Brain: No evidence of acute infarction, hemorrhage, hydrocephalus, extra-axial collection or mass lesion/mass effect. Vascular: No hyperdense vessel or unexpected calcification. Skull: Normal. Negative for fracture or focal lesion. Sinuses/Orbits: No acute finding. Other: Skin clips along the midline posteriorly. CT CERVICAL SPINE FINDINGS Alignment: Straightening of the usual cervical lordosis. This may be due to patient positioning but ligamentous injury or muscle spasm could also have this appearance and are not excluded. Normal alignment of the facet joints. C1-2 articulation appears intact. Skull base and vertebrae: No acute fracture. No primary bone lesion or focal pathologic process. Soft tissues and spinal canal: No prevertebral fluid or swelling. No visible canal hematoma. Disc levels: Degenerative changes throughout the cervical spine  with narrowed disc spaces and endplate hypertrophic changes. Upper chest: Emphysematous changes in the lung apices. Other: Diffuse enlargement of the thyroid gland with multiple  nodular components, greater on the left. Calcified nodules on the right. Left thyroid measures up to about 3.2 cm in diameter. IMPRESSION: 1. No acute intracranial abnormalities. 2. Nonspecific straightening of usual cervical lordosis. No acute displaced cervical spine fractures identified. 3. Diffuse nodular enlargement of the thyroid gland with left thyroid measuring about 3.2 cm diameter. Suggest further evaluation with ultrasound. Electronically Signed   By: Lucienne Capers M.D.   On: 11/13/2016 02:31   Ct Cervical Spine Wo Contrast  Result Date: 11/13/2016 CLINICAL DATA:  Trip and fall injury. Laceration to the back of the head. History of liver cancer with chemotherapy 2 weeks ago. EXAM: CT HEAD WITHOUT CONTRAST CT CERVICAL SPINE WITHOUT CONTRAST TECHNIQUE: Multidetector CT imaging of the head and cervical spine was performed following the standard protocol without intravenous contrast. Multiplanar CT image reconstructions of the cervical spine were also generated. COMPARISON:  None. FINDINGS: CT HEAD FINDINGS Brain: No evidence of acute infarction, hemorrhage, hydrocephalus, extra-axial collection or mass lesion/mass effect. Vascular: No hyperdense vessel or unexpected calcification. Skull: Normal. Negative for fracture or focal lesion. Sinuses/Orbits: No acute finding. Other: Skin clips along the midline posteriorly. CT CERVICAL SPINE FINDINGS Alignment: Straightening of the usual cervical lordosis. This may be due to patient positioning but ligamentous injury or muscle spasm could also have this appearance and are not excluded. Normal alignment of the facet joints. C1-2 articulation appears intact. Skull base and vertebrae: No acute fracture. No primary bone lesion or focal pathologic process. Soft tissues and spinal canal: No prevertebral fluid or swelling. No visible canal hematoma. Disc levels: Degenerative changes throughout the cervical spine with narrowed disc spaces and endplate hypertrophic  changes. Upper chest: Emphysematous changes in the lung apices. Other: Diffuse enlargement of the thyroid gland with multiple nodular components, greater on the left. Calcified nodules on the right. Left thyroid measures up to about 3.2 cm in diameter. IMPRESSION: 1. No acute intracranial abnormalities. 2. Nonspecific straightening of usual cervical lordosis. No acute displaced cervical spine fractures identified. 3. Diffuse nodular enlargement of the thyroid gland with left thyroid measuring about 3.2 cm diameter. Suggest further evaluation with ultrasound. Electronically Signed   By: Lucienne Capers M.D.   On: 11/13/2016 02:31    Review of Systems  Constitutional: Negative for chills and fever.  HENT: Negative for sore throat and tinnitus.   Eyes: Negative for blurred vision and redness.  Respiratory: Negative for cough and shortness of breath.   Cardiovascular: Negative for chest pain, palpitations, orthopnea and PND.  Gastrointestinal: Negative for abdominal pain, diarrhea, nausea and vomiting.  Genitourinary: Negative for dysuria, frequency and urgency.  Musculoskeletal: Positive for falls. Negative for joint pain and myalgias.  Skin: Negative for rash.       No lesions  Neurological: Negative for speech change, focal weakness and weakness.  Endo/Heme/Allergies: Does not bruise/bleed easily.       No temperature intolerance  Psychiatric/Behavioral: Negative for depression and suicidal ideas.    Blood pressure 124/62, pulse 98, temperature 98.9 F (37.2 C), temperature source Oral, resp. rate (!) 21, height 5' 2"  (1.575 m), weight 68.5 kg (151 lb), SpO2 99 %. Physical Exam  Vitals reviewed. Constitutional: She is oriented to person, place, and time. She appears well-developed and well-nourished. No distress.  HENT:  Head: Normocephalic.  Mouth/Throat: Oropharynx is clear and moist.  Head laceration with  stable closure  Eyes: Pupils are equal, round, and reactive to light.  Conjunctivae and EOM are normal. No scleral icterus.  Neck: Normal range of motion. Neck supple. No JVD present. No tracheal deviation present. No thyromegaly present.  Cardiovascular: Normal rate, regular rhythm and normal heart sounds.  Exam reveals no gallop and no friction rub.   No murmur heard. Respiratory: Effort normal and breath sounds normal.  GI: Soft. Bowel sounds are normal. She exhibits no distension. There is no tenderness.  Biliary drains in place and draining   Genitourinary:  Genitourinary Comments: Deferred  Musculoskeletal: Normal range of motion. She exhibits no edema.  Lymphadenopathy:    She has no cervical adenopathy.  Neurological: She is alert and oriented to person, place, and time. No cranial nerve deficit. She exhibits normal muscle tone.  Skin: Skin is warm and dry. Rash (erythematous plaques on chest) noted. No erythema.  Psychiatric: She has a normal mood and affect. Her behavior is normal. Judgment and thought content normal.     Assessment/Plan This is a 76 year old female admitted for sepsis. 1. Sepsis: The patient meets criteria via fever, tachycardia and tachypnea. Lungs are clear but we're awaiting urine for analysis. The patient also has biliary drains in place which potentially could be infected. She is hemodynamically stable. Follow blood cultures for growth and sensitivities. 2. Hyponatremia: Secondary to liver disease. Continue diuretic therapy in addition to hydration with normal saline which should correct sodium osmolality. 3. Hypertension: Controlled; continue amlodipine 4. Lupus: The patient's plaque we'll has been held since starting chemotherapy. I will continue to hold this medication but we should touch base with her rheumatologist for recommendations to restart therapy given the patient does have a plaque-like rash on her chest. 5. Diabetes mellitus type 2: Hold oral hypoglycemic agents. Sliding insulin while hospitalized 6. Depression:  Continue Effexor 7. DVT prophylaxis: Lovenox 8. GI prophylaxis: Pantoprazole per home regimen The patient is a DO NOT RESUSCITATE. Time spent on admission orders and patient care approximately 45 minutes  Harrie Foreman, MD 11/13/2016, 4:59 AM

## 2016-11-13 NOTE — Progress Notes (Signed)
Pharmacy Antibiotic Note  Regina Hood is a 76 y.o. female admitted on 11/13/2016 with sepsis.  Pharmacy has been consulted for vancomycin and Zosyn dosing.  Plan: DW 58kg  Vd 41L kei 0.05 hr-1  T1/2 14 hours Vancomycin 1 gram q 18 hours ordered with stacked dosing. Level before 5th dose. Goal trough 15-20.  Zosyn 3.375 grams q 8 hours ordered.  Height: 5' 2"  (157.5 cm) Weight: 153 lb (69.4 kg) IBW/kg (Calculated) : 50.1  Temp (24hrs), Avg:100.5 F (38.1 C), Min:100.5 F (38.1 C), Max:100.5 F (38.1 C)   Recent Labs Lab 11/13/16 0019  WBC 6.9  CREATININE 0.64  LATICACIDVEN 2.6*    Estimated Creatinine Clearance: 55.4 mL/min (by C-G formula based on SCr of 0.64 mg/dL).    Allergies  Allergen Reactions  . Feldene [Piroxicam]   . Penicillamine Other (See Comments)  . Qualaquin [Quinine]     Antimicrobials this admission: vancomycin Zosyn 7/28 >>   >>   Dose adjustments this admission:   Microbiology results: 7/28 BCx: pending 7/28 UCx: pending       7/28 UA: pending 7/28 CXR: no consolidation/airspace disease  Thank you for allowing pharmacy to be a part of this patient's care.  Claude Swendsen S 11/13/2016 2:46 AM

## 2016-11-13 NOTE — Progress Notes (Signed)
Patient seen. Feels back to normal. Afebrile. Sitting at side of bed.  Discussed regarding sepsis and need for abx and waiting for cx.  Likely d/c in AM if ccultures negative and afebrile.  D/C tele and continuous pulse ox

## 2016-11-13 NOTE — Progress Notes (Signed)
Pt has on red (allergies), purple(DNR) and yellow(fall) armbands.

## 2016-11-13 NOTE — ED Notes (Signed)
Patient returned from radiology

## 2016-11-14 LAB — HEMOGLOBIN A1C
Hgb A1c MFr Bld: 6.3 % — ABNORMAL HIGH (ref 4.8–5.6)
Mean Plasma Glucose: 134 mg/dL

## 2016-11-14 LAB — URINE CULTURE

## 2016-11-14 LAB — GLUCOSE, CAPILLARY: Glucose-Capillary: 74 mg/dL (ref 65–99)

## 2016-11-14 NOTE — Progress Notes (Signed)
Pt. Has on red, yellow and purple precaution bands.

## 2016-11-14 NOTE — Discharge Instructions (Signed)
Resume diet and activity as before  Please call your doctor or return to ER if any fever >100.4

## 2016-11-14 NOTE — Progress Notes (Signed)
Pt is being discharged home. Discharge papers given and explained to pt. Pt verbalized understanding. Meds and f/u appointment reviewed with pt. No Rx at this time.

## 2016-11-14 NOTE — Care Management Obs Status (Signed)
[employer] NOTIFICATION   Patient Details  Name: Regina Hood MRN: [id number] Date of Birth: [birth year]   Medicare Observation Status Notification Given:  Yes    Adenike Shidler A, RN 11/14/2016, 10:35 AM

## 2016-11-14 NOTE — Progress Notes (Deleted)
Dr. Marcille Blanco notified of mag level 1.3 no new orders at this time

## 2016-11-14 NOTE — Care Management CC44 (Signed)
Condition Code 44 Documentation Completed  Patient Details  Name: GAIL VENDETTI MRN: [id number] Date of Birth: 26-Aug-1940   Condition Code 44 given:  Yes Patient signature on Condition Code 44 notice:  Yes Documentation of 2 MD's agreement:  Yes Code 44 added to claim:  Yes    Erol Flanagin A, RN 11/14/2016, 10:35 AM

## 2016-11-14 NOTE — Progress Notes (Signed)
Pt has on red, yellow and purple armbands.

## 2016-11-17 NOTE — Discharge Summary (Signed)
[facility] NAME: Regina Hood    MR#:  [id number]  DATE OF BIRTH:  1941-01-26  DATE OF ADMISSION:  11/13/2016 ADMITTING PHYSICIAN: Harrie Foreman, MD  DATE OF DISCHARGE: 11/14/2016 11:06 AM  PRIMARY CARE PHYSICIAN: Idelle Crouch, MD   ADMISSION DIAGNOSIS:  fall, head laceration  DISCHARGE DIAGNOSIS:  Active Problems:   Sepsis ([place])   SECONDARY DIAGNOSIS:   Past Medical History:  Diagnosis Date  . Arthritis   . Cancer ([place])   . Cellulitis   . COPD (chronic obstructive pulmonary disease) ([place])   . DDD (degenerative disc disease), lumbar   . Depression   . Diabetes mellitus   . Diverticulitis   . GERD (gastroesophageal reflux disease)   . GI bleed   . Headache(784.0)   . Hypertension   . Lupus   . Ulcerative colitis ([place])      ADMITTING HISTORY  Chief Complaint: Fall HPI: The patient with past medical history of cholangiocarcinoma, hypertension, diabetes, lupus and COPD presents to the emergency department after a fall. The patient states that she was climbing onto her bed when she takes possibly fell backward. The patient denies loss of consciousness before or after the fall. She lacerated the occipital area of her scalp which required 5 staples for closure. CT of her head showed no acute intracranial process. In the emergency department she was found to be febrile with tachycardia and tachypnea. Code sepsis was initiated and the patient received broad-spectrum antibiotics after blood cultures and 30 mL/kg of fluid resuscitation. The patient denies cough or dysuria. After the patient was stabilized emergency department staff called the hospitalist service for admission.  HOSPITAL COURSE:   * Sepsis of unknown etiology. Blood cultures were drawn which were negative. Urinalysis showed no UTI. Chest x-ray was clear. And had presented to the emergency room for a fall but found to have fever and tachycardia. Started on  broad-spectrum antibiotics. With cultures, UA and chest x-ray negative patient is being discharged home. No antibiotics. Advised to return if she has any further problems or fever.  Discharged home in stable condition. No change in medications.  CONSULTS OBTAINED:    DRUG ALLERGIES:   Allergies  Allergen Reactions  . Feldene [Piroxicam]   . Penicillamine Other (See Comments)  . Qualaquin [Quinine]     DISCHARGE MEDICATIONS:   Discharge Medication List as of 11/14/2016 10:38 AM    CONTINUE these medications which have NOT CHANGED   Details  acetaminophen (TYLENOL) 500 MG tablet Take 1,000 mg by mouth 3 (three) times daily., Historical Med    amLODipine (NORVASC) 5 MG tablet Take 5 mg by mouth daily., Starting Tue 06/01/2016, Historical Med    [employer] STOOL SOFTENER 8.6-50 MG tablet Take 1 tablet by mouth daily., Starting Tue 10/19/2016, Historical Med    estradiol (ESTRACE) 1 MG tablet Take 1 mg by mouth daily., Starting Wed 06/16/2016, Historical Med    gabapentin (NEURONTIN) 100 MG capsule Take 100 mg by mouth 3 (three) times daily. , Historical Med    KLOR-CON 10 10 MEQ tablet Take 2 tablets by mouth daily., Starting Tue 10/19/2016, Historical Med    metFORMIN (GLUCOPHAGE) 500 MG tablet Take 500 mg by mouth 2 (two) times daily with a meal., Until Discontinued, Historical Med    morphine (MS CONTIN) 15 MG 12 hr tablet Take 15 mg by mouth at bedtime., Starting Wed 10/06/2016, Historical Med    ondansetron (ZOFRAN) 8 MG tablet  Take 8 mg by mouth every 12 (twelve) hours as needed for nausea/vomiting., Starting Tue 10/19/2016, Historical Med    oxyCODONE (OXY IR/ROXICODONE) 5 MG immediate release tablet Take 5 mg by mouth every 4 (four) hours as needed for pain., Starting Wed 10/06/2016, Historical Med    pantoprazole (PROTONIX) 40 MG tablet Take 40 mg by mouth daily., Until Discontinued, Historical Med    Sodium Chloride Flush (NORMAL SALINE FLUSH) 0.9 % SOLN 1 mL by Other route 2 (two)  times daily. Flush each biliary drain with 1 mL twice daily, Starting Thu 10/28/2016, Historical Med    traMADol (ULTRAM) 50 MG tablet Take 50 mg by mouth 2 (two) times daily. , Historical Med    venlafaxine XR (EFFEXOR-XR) 75 MG 24 hr capsule Take 225 mg by mouth daily. , Historical Med      STOP taking these medications     ADVAIR DISKUS 250-50 MCG/DOSE AEPB      hydroxychloroquine (PLAQUENIL) 200 MG tablet         Today   VITAL SIGNS:  Blood pressure 131/63, pulse (!) 113, temperature 98.6 F (37 C), temperature source Oral, resp. rate 18, height 5' 2"  (1.575 m), weight 70 kg (154 lb 4.8 oz), SpO2 98 %.  I/O:  No intake or output data in the 24 hours ending 11/17/16 1318  PHYSICAL EXAMINATION:  Physical Exam  GENERAL:  76 y.o.-year-old patient lying in the bed with no acute distress.  LUNGS: Normal breath sounds bilaterally, no wheezing, rales,rhonchi or crepitation. No use of accessory muscles of respiration.  CARDIOVASCULAR: S1, S2 normal. No murmurs, rubs, or gallops.  ABDOMEN: Soft, non-tender, non-distended. Bowel sounds present. No organomegaly or mass.  NEUROLOGIC: Moves all 4 extremities. PSYCHIATRIC: The patient is alert and oriented x 3.  SKIN: No obvious rash, lesion, or ulcer.  Biliary drain in place   DATA REVIEW:   CBC  Recent Labs Lab 11/13/16 0019  WBC 6.9  HGB 9.2*  HCT 27.9*  PLT 216    Chemistries   Recent Labs Lab 11/13/16 0019  NA 133*  K 3.5  CL 100*  CO2 22  GLUCOSE 132*  BUN 11  CREATININE 0.64  CALCIUM 7.9*  AST 53*  ALT 41  ALKPHOS 265*  BILITOT 0.8    Cardiac Enzymes No results for input(s): TROPONINI in the last 168 hours.  Microbiology Results  Results for orders placed or performed during the hospital encounter of 11/13/16  Culture, blood (Routine x 2)     Status: None (Preliminary result)   Collection Time: 11/13/16 12:19 AM  Result Value Ref Range Status   Specimen Description BLOOD LEFT FOREARM  Final    Special Requests   Final    BOTTLES DRAWN AEROBIC AND ANAEROBIC Blood Culture results may not be optimal due to an excessive volume of blood received in culture bottles   Culture NO GROWTH 4 DAYS  Final   Report Status PENDING  Incomplete  Culture, blood (Routine x 2)     Status: None (Preliminary result)   Collection Time: 11/13/16 12:59 AM  Result Value Ref Range Status   Specimen Description BLOOD RIGHT WRIST  Final   Special Requests   Final    BOTTLES DRAWN AEROBIC AND ANAEROBIC Blood Culture results may not be optimal due to an excessive volume of blood received in culture bottles   Culture NO GROWTH 4 DAYS  Final   Report Status PENDING  Incomplete  Urine culture     Status:  Abnormal   Collection Time: 11/13/16  4:14 AM  Result Value Ref Range Status   Specimen Description URINE, RANDOM  Final   Special Requests NONE  Final   Culture MULTIPLE SPECIES PRESENT, SUGGEST RECOLLECTION (A)  Final   Report Status 11/14/2016 FINAL  Final    RADIOLOGY:  No results found.  Follow up with PCP in 1 week.  Management plans discussed with the patient, family and they are in agreement.  CODE STATUS:  Code Status History    Date Active Date Inactive Code Status Order ID Comments User Context   11/13/2016  3:01 AM 11/14/2016  2:12 PM DNR [id number]  Harrie Foreman, MD Inpatient   06/29/2016  3:33 PM 07/01/2016  7:22 PM DNR [id number]  Hillary Bow, MD ED   09/17/2011  2:43 PM 09/19/2011  4:24 PM Full Code [id number]  Corum, Jeani Sow, RN Inpatient    Questions for Most Recent Historical Code Status (Order [id number])    Question Answer Comment   In the event of cardiac or respiratory ARREST Do not call a "code blue"    In the event of cardiac or respiratory ARREST Do not perform Intubation, CPR, defibrillation or ACLS    In the event of cardiac or respiratory ARREST Use medication by any route, position, wound care, and other measures to relive pain and suffering. May use oxygen, suction and  manual treatment of airway obstruction as needed for comfort.         Advance Directive Documentation     Most Recent Value  Type of Advance Directive  Healthcare Power of Attorney, Living will  Pre-existing out of facility DNR order (yellow form or pink MOST form)  -  "MOST" Form in Place?  -      TOTAL TIME TAKING CARE OF THIS PATIENT ON DAY OF DISCHARGE: more than 30 minutes.   Hillary Bow R M.D on 11/17/2016 at 1:18 PM  Between 7am to 6pm - Pager - [phone]  After 6pm go to [email or web address] - password [email or web address] [facility]  Office  [phone]  CC: Primary care physician; Idelle Crouch, MD  Note: This dictation was prepared with Dragon dictation along with smaller phrase technology. Any transcriptional errors that result from this process are unintentional.

## 2016-11-18 LAB — CULTURE, BLOOD (ROUTINE X 2)
CULTURE: NO GROWTH
Culture: NO GROWTH

## 2016-11-19 DIAGNOSIS — Z5189 Encounter for other specified aftercare: Secondary | ICD-10-CM | POA: Diagnosis not present

## 2016-11-19 DIAGNOSIS — K648 Other hemorrhoids: Secondary | ICD-10-CM | POA: Diagnosis not present

## 2016-11-22 DIAGNOSIS — M25512 Pain in left shoulder: Secondary | ICD-10-CM | POA: Diagnosis not present

## 2016-11-22 DIAGNOSIS — D72829 Elevated white blood cell count, unspecified: Secondary | ICD-10-CM | POA: Diagnosis not present

## 2016-11-22 DIAGNOSIS — R918 Other nonspecific abnormal finding of lung field: Secondary | ICD-10-CM | POA: Diagnosis not present

## 2016-11-22 DIAGNOSIS — R112 Nausea with vomiting, unspecified: Secondary | ICD-10-CM | POA: Diagnosis not present

## 2016-11-22 DIAGNOSIS — E876 Hypokalemia: Secondary | ICD-10-CM | POA: Diagnosis not present

## 2016-11-22 DIAGNOSIS — M1990 Unspecified osteoarthritis, unspecified site: Secondary | ICD-10-CM | POA: Diagnosis not present

## 2016-11-22 DIAGNOSIS — Z79899 Other long term (current) drug therapy: Secondary | ICD-10-CM | POA: Diagnosis not present

## 2016-11-22 DIAGNOSIS — Z87891 Personal history of nicotine dependence: Secondary | ICD-10-CM | POA: Diagnosis not present

## 2016-11-22 DIAGNOSIS — Z5181 Encounter for therapeutic drug level monitoring: Secondary | ICD-10-CM | POA: Diagnosis not present

## 2016-11-22 DIAGNOSIS — R9431 Abnormal electrocardiogram [ECG] [EKG]: Secondary | ICD-10-CM | POA: Diagnosis not present

## 2016-11-22 DIAGNOSIS — R21 Rash and other nonspecific skin eruption: Secondary | ICD-10-CM | POA: Diagnosis not present

## 2016-11-22 DIAGNOSIS — R1110 Vomiting, unspecified: Secondary | ICD-10-CM | POA: Diagnosis not present

## 2016-11-23 DIAGNOSIS — Z5111 Encounter for antineoplastic chemotherapy: Secondary | ICD-10-CM | POA: Diagnosis not present

## 2016-11-23 DIAGNOSIS — C249 Malignant neoplasm of biliary tract, unspecified: Secondary | ICD-10-CM | POA: Diagnosis not present

## 2016-11-23 DIAGNOSIS — Z515 Encounter for palliative care: Secondary | ICD-10-CM | POA: Diagnosis not present

## 2016-11-23 DIAGNOSIS — R768 Other specified abnormal immunological findings in serum: Secondary | ICD-10-CM | POA: Diagnosis not present

## 2016-11-23 DIAGNOSIS — E876 Hypokalemia: Secondary | ICD-10-CM | POA: Diagnosis not present

## 2016-11-23 DIAGNOSIS — M7989 Other specified soft tissue disorders: Secondary | ICD-10-CM | POA: Diagnosis not present

## 2016-11-24 DIAGNOSIS — J449 Chronic obstructive pulmonary disease, unspecified: Secondary | ICD-10-CM | POA: Diagnosis not present

## 2016-11-27 NOTE — ED Provider Notes (Signed)
[facility] Emergency Department Provider Note    First MD Initiated Contact with Patient 11/13/16 [phone]     (approximate)  I have reviewed the triage vital signs and the nursing notes.   HISTORY  Chief Complaint Fall; Head Laceration; and Fever    HPI Regina Hood is a 76 y.o. female bolus of chronic medical conditions including [place] carcinoma currently receiving chemotherapy presents to the emergency department with accidental fall which occurred while the patient was attempting to get out of bed. Patient stated that she fell backward with obvious occipital injury. Patient does admit to generalized weakness preceding event. Patient noted to be febrile on presentation to the emergency department.   Past Medical History:  Diagnosis Date  . Arthritis   . Cancer ([place])   . Cellulitis   . COPD (chronic obstructive pulmonary disease) ([place])   . DDD (degenerative disc disease), lumbar   . Depression   . Diabetes mellitus   . Diverticulitis   . GERD (gastroesophageal reflux disease)   . GI bleed   . Headache(784.0)   . Hypertension   . Lupus   . Ulcerative colitis [facility])     Patient Active Problem List   Diagnosis Date Noted  . Sepsis ([place]) 11/13/2016  . Rectal bleeding 06/29/2016  . Proximal humerus fracture, left, closed, initial encounter 09/17/2011    Past Surgical History:  Procedure Laterality Date  . ABDOMINAL HYSTERECTOMY    . BREAST BIOPSY Right   . COLONOSCOPY    . COLONOSCOPY WITH PROPOFOL N/A 08/18/2016   Procedure: COLONOSCOPY WITH PROPOFOL;  Surgeon: Manya Silvas, MD;  Location: [facility] ENDOSCOPY;  Service: Endoscopy;  Laterality: N/A;  . ESOPHAGOGASTRODUODENOSCOPY (EGD) WITH PROPOFOL N/A 08/18/2016   Procedure: ESOPHAGOGASTRODUODENOSCOPY (EGD) WITH PROPOFOL;  Surgeon: Manya Silvas, MD;  Location: [facility] ENDOSCOPY;  Service: Endoscopy;  Laterality: N/A;  . ORIF HUMERUS FRACTURE  09/17/2011   Procedure: OPEN REDUCTION INTERNAL  FIXATION (ORIF) PROXIMAL HUMERUS FRACTURE;  Surgeon: Augustin Schooling, MD;  Location: [place];  Service: Orthopedics;  Laterality: Left;    Prior to Admission medications   Medication Sig Start Date End Date Taking? Authorizing Provider  acetaminophen (TYLENOL) 500 MG tablet Take 1,000 mg by mouth 3 (three) times daily.   Yes [provider]  amLODipine (NORVASC) 5 MG tablet Take 5 mg by mouth daily. 06/01/16  Yes [provider]  [employer] STOOL SOFTENER 8.6-50 MG tablet Take 1 tablet by mouth daily. 10/19/16  Yes [provider]  estradiol (ESTRACE) 1 MG tablet Take 1 mg by mouth daily. 06/16/16  Yes [provider]  gabapentin (NEURONTIN) 100 MG capsule Take 100 mg by mouth 3 (three) times daily.    Yes [provider]  KLOR-CON 10 10 MEQ tablet Take 2 tablets by mouth daily. 10/19/16  Yes [provider]  metFORMIN (GLUCOPHAGE) 500 MG tablet Take 500 mg by mouth 2 (two) times daily with a meal.   Yes [provider]  morphine (MS CONTIN) 15 MG 12 hr tablet Take 15 mg by mouth at bedtime. 10/06/16  Yes [provider]  ondansetron (ZOFRAN) 8 MG tablet Take 8 mg by mouth every 12 (twelve) hours as needed for nausea/vomiting. 10/19/16  Yes [provider]  oxyCODONE (OXY IR/ROXICODONE) 5 MG immediate release tablet Take 5 mg by mouth every 4 (four) hours as needed for pain. 10/06/16  Yes [provider]  pantoprazole (PROTONIX) 40 MG tablet Take 40 mg by mouth daily.  Yes [provider]  Sodium Chloride Flush (NORMAL SALINE FLUSH) 0.9 % SOLN 1 mL by Other route 2 (two) times daily. Flush each biliary drain with 1 mL twice daily 10/28/16  Yes [provider]  traMADol (ULTRAM) 50 MG tablet Take 50 mg by mouth 2 (two) times daily.    Yes [provider]  venlafaxine XR (EFFEXOR-XR) 75 MG 24 hr capsule Take 225 mg by mouth daily.    Yes [provider]    Allergies Feldene [piroxicam];  Penicillamine; and Qualaquin [quinine]  Family History  Problem Relation Age of Onset  . Breast cancer Mother 29    Social History Social History  Substance Use Topics  . Smoking status: Former Smoker    Packs/day: 1.00    Years: 10.00    Types: Cigarettes  . Smokeless tobacco: Never Used     Comment: quit smoking in 2003  . Alcohol use No    Review of Systems Constitutional: No fever/chills Eyes: No visual changes. ENT: No sore throat. Cardiovascular: Denies chest pain. Respiratory: Denies shortness of breath. Gastrointestinal: No abdominal pain.  No nausea, no vomiting.  No diarrhea.  No constipation. Genitourinary: Negative for dysuria. Musculoskeletal: Negative for neck pain.  Negative for back pain. Integumentary: Negative for rash. Neurological: Negative for headaches, focal weakness or numbness. Positive for generalized fatigue   ____________________________________________   PHYSICAL EXAM:  VITAL SIGNS: ED Triage Vitals  Enc Vitals Group     BP 11/13/16 0017 (!) 144/76     Pulse Rate 11/13/16 0017 (!) 118     Resp 11/13/16 0017 18     Temp 11/13/16 0017 (!) 100.5 F (38.1 C)     Temp Source 11/13/16 0017 Oral     SpO2 11/13/16 0017 97 %     Weight 11/13/16 0018 69.4 kg (153 lb)     Height 11/13/16 0018 1.575 m (5' 2" )     Head Circumference --      Peak Flow --      Pain Score 11/13/16 0019 6     Pain Loc --      Pain Edu? --      Excl. in [place]? --     Constitutional: Alert and oriented. Well appearing and in no acute distress. Eyes: Conjunctivae are normal. PERRL. EOMI. Head: 5 cm linear occipital scalp laceration Ears:  Healthy appearing ear canals and TMs bilaterally Nose: No congestion/rhinnorhea. Mouth/Throat: Mucous membranes are moist Neck: No stridor.  No cervical spine tenderness to palpation. Cardiovascular: Tachycardia, regular rhythm. Good peripheral circulation. Grossly normal heart sounds. Respiratory: Normal respiratory effort.  No  retractions. Lungs CTAB. Gastrointestinal: Soft and nontender. No distention.  Musculoskeletal: No lower extremity tenderness nor edema. No gross deformities of extremities. Neurologic:  Normal speech and language. No gross focal neurologic deficits are appreciated.  Skin:  Skin is warm, dry and intact. No rash noted.5 cm linear occipital scalp laceration Psychiatric: Mood and affect are normal. Speech and behavior are normal.  ____________________________________________   LABS (all labs ordered are listed, but only abnormal results are displayed)  Labs Reviewed  URINE CULTURE - Abnormal; Notable for the following:       Result Value   Culture MULTIPLE SPECIES PRESENT, SUGGEST RECOLLECTION (*)    All other components within normal limits  COMPREHENSIVE METABOLIC PANEL - Abnormal; Notable for the following:    Sodium 133 (*)    Chloride 100 (*)    Glucose, Bld 132 (*)    Calcium 7.9 (*)  Albumin 3.1 (*)    AST 53 (*)    Alkaline Phosphatase 265 (*)    All other components within normal limits  LACTIC ACID, PLASMA - Abnormal; Notable for the following:    Lactic Acid, Venous 2.6 (*)    All other components within normal limits  LACTIC ACID, PLASMA - Abnormal; Notable for the following:    Lactic Acid, Venous 3.0 (*)    All other components within normal limits  CBC WITH DIFFERENTIAL/PLATELET - Abnormal; Notable for the following:    RBC 3.41 (*)    Hemoglobin 9.2 (*)    HCT 27.9 (*)    RDW 17.7 (*)    Monocytes Absolute 1.0 (*)    All other components within normal limits  URINALYSIS, COMPLETE (UACMP) WITH MICROSCOPIC - Abnormal; Notable for the following:    Color, Urine YELLOW (*)    APPearance CLEAR (*)    Hgb urine dipstick SMALL (*)    Squamous Epithelial / LPF 0-5 (*)    All other components within normal limits  HEMOGLOBIN A1C - Abnormal; Notable for the following:    Hgb A1c MFr Bld 6.3 (*)    All other components within normal limits  GLUCOSE, CAPILLARY -  Abnormal; Notable for the following:    Glucose-Capillary 104 (*)    All other components within normal limits  GLUCOSE, CAPILLARY - Abnormal; Notable for the following:    Glucose-Capillary 100 (*)    All other components within normal limits  CULTURE, BLOOD (ROUTINE X 2)  CULTURE, BLOOD (ROUTINE X 2)  PROTIME-INR  TSH  LACTIC ACID, PLASMA  GLUCOSE, CAPILLARY  GLUCOSE, CAPILLARY  GLUCOSE, CAPILLARY   ____________________________________________  EKG  ED ECG REPORT I, [place] N BROWN, the attending physician, personally viewed and interpreted this ECG.   Date: 11/13/2016  EKG Time: 12:35 AM  Rate: 114  Rhythm: Sinus tachycardia  Axis: Normal  Intervals: Normal  ST&T Change: None  ____________________________________________  RADIOLOGY I,  N BROWN, personally viewed and evaluated these images (plain radiographs) as part of my medical decision making, as well as reviewing the written report by the radiologist.  CLINICAL DATA:  Trip and fall injury. Laceration to the back of the head. History of liver cancer post chemotherapy 2 weeks ago. COPD. Diabetes. Gastroesophageal reflux disease. Hypertension. Former smoker.  EXAM: CHEST  2 VIEW  COMPARISON:  CT chest 08/29/2012.  Chest 09/17/2011.  FINDINGS: Right central venous power port type catheter with tip over the mid SVC region. No pneumothorax. Drainage catheters in the upper abdomen. These likely represent biliary drains. Surgical clips in the right lung. Postoperative changes in the left shoulder.  Borderline heart size and pulmonary vascularity are likely normal for technique. Interstitial changes in the lung bases consistent with fibrosis. No airspace disease or consolidation in the lungs. Scattered calcified granulomas. No blunting of costophrenic angles. No pneumothorax. Degenerative changes in the spine.  IMPRESSION: Fibrosis in the lungs likely related to history of sarcoidosis.  No consolidation or airspace disease.   Electronically Signed   By: Lucienne Capers M.D.   On: 11/13/2016 01:22 ____________________________________________   PROCEDURES  Critical Care performed: CRITICAL CARE Performed by: Gregor Hams   Total critical care time: 28mnutes  Critical care time was exclusive of separately billable procedures and treating other patients.  Critical care was necessary to treat or prevent imminent or life-threatening deterioration.  Critical care was time spent personally by me on the following activities: development of treatment plan with patient  and/or surrogate as well as nursing, discussions with consultants, evaluation of patient's response to treatment, examination of patient, obtaining history from patient or surrogate, ordering and performing treatments and interventions, ordering and review of laboratory studies, ordering and review of radiographic studies, pulse oximetry and re-evaluation of patient's condition.   Procedures   ____________________________________________   INITIAL IMPRESSION / ASSESSMENT AND PLAN / ED COURSE  Pertinent labs & imaging results that were available during my care of the patient were reviewed by me and considered in my medical decision making (see chart for details).  76 year old female presenting to emergency department with accidental fall with occipital laceration. Patient noted to be febrile and medics sepsis criteria and emergency department as such appropriate broad-spectrum antibiotics given. Patient discussed with Dr. Hedda Slade admission for further evaluation and management.      ____________________________________________  FINAL CLINICAL IMPRESSION(S) / ED DIAGNOSES  Sepsis  MEDICATIONS GIVEN DURING THIS VISIT:  Medications  vancomycin (VANCOCIN) IVPB 1000 mg/200 mL premix (0 mg Intravenous Stopped 11/13/16 0353)  piperacillin-tazobactam (ZOSYN) 3-0.375 GM/50ML IVPB (0 g   Stopped 11/13/16 0354)  lidocaine (PF) (XYLOCAINE) 1 % injection 5 mL (5 mLs Intradermal Given 11/13/16 0149)  sodium chloride 0.9 % bolus 500 mL (0 mLs Intravenous Stopped 11/13/16 0500)     NEW OUTPATIENT MEDICATIONS STARTED DURING THIS VISIT:  Discharge Medication List as of 11/14/2016 10:38 AM      Discharge Medication List as of 11/14/2016 10:38 AM      Discharge Medication List as of 11/14/2016 10:38 AM    STOP taking these medications     ADVAIR DISKUS 250-50 MCG/DOSE AEPB Comments:  Reason for Stopping:       hydroxychloroquine (PLAQUENIL) 200 MG tablet Comments:  Reason for Stopping:           Note:  This document was prepared using Dragon voice recognition software and may include unintentional dictation errors.    Gregor Hams, MD 11/27/16 [phone]

## 2016-12-06 DIAGNOSIS — E119 Type 2 diabetes mellitus without complications: Secondary | ICD-10-CM | POA: Diagnosis not present

## 2016-12-06 DIAGNOSIS — J849 Interstitial pulmonary disease, unspecified: Secondary | ICD-10-CM | POA: Diagnosis not present

## 2016-12-06 DIAGNOSIS — L931 Subacute cutaneous lupus erythematosus: Secondary | ICD-10-CM | POA: Diagnosis not present

## 2016-12-07 DIAGNOSIS — C249 Malignant neoplasm of biliary tract, unspecified: Secondary | ICD-10-CM | POA: Diagnosis not present

## 2016-12-07 DIAGNOSIS — Z515 Encounter for palliative care: Secondary | ICD-10-CM | POA: Diagnosis not present

## 2016-12-07 DIAGNOSIS — Z5111 Encounter for antineoplastic chemotherapy: Secondary | ICD-10-CM | POA: Diagnosis not present

## 2016-12-07 DIAGNOSIS — R768 Other specified abnormal immunological findings in serum: Secondary | ICD-10-CM | POA: Diagnosis not present

## 2016-12-14 DIAGNOSIS — Z515 Encounter for palliative care: Secondary | ICD-10-CM | POA: Diagnosis not present

## 2016-12-14 DIAGNOSIS — E042 Nontoxic multinodular goiter: Secondary | ICD-10-CM | POA: Diagnosis not present

## 2016-12-14 DIAGNOSIS — Z5111 Encounter for antineoplastic chemotherapy: Secondary | ICD-10-CM | POA: Diagnosis not present

## 2016-12-14 DIAGNOSIS — C249 Malignant neoplasm of biliary tract, unspecified: Secondary | ICD-10-CM | POA: Diagnosis not present

## 2016-12-14 DIAGNOSIS — L930 Discoid lupus erythematosus: Secondary | ICD-10-CM | POA: Diagnosis not present

## 2016-12-16 DIAGNOSIS — Z5111 Encounter for antineoplastic chemotherapy: Secondary | ICD-10-CM | POA: Diagnosis not present

## 2016-12-16 DIAGNOSIS — C249 Malignant neoplasm of biliary tract, unspecified: Secondary | ICD-10-CM | POA: Diagnosis not present

## 2016-12-17 ENCOUNTER — Other Ambulatory Visit: Payer: Self-pay | Admitting: *Deleted

## 2016-12-17 NOTE — Progress Notes (Signed)
Open in error

## 2016-12-21 ENCOUNTER — Telehealth: Payer: Self-pay | Admitting: Oncology

## 2016-12-21 ENCOUNTER — Inpatient Hospital Stay: Payer: PPO | Admitting: Oncology

## 2016-12-21 NOTE — Telephone Encounter (Signed)
see md ref Filomena Jungling cholangiocarcinoma continued care from [facility] (called office with appt mailed new pt packet)  (per Steffanie Dunn Dr Tasia Catchings stated to r/s from 9/10 to 9/4 and doublebook for 30 mintues called pt 3 times v/m full, called son Elta [place] and gave updated appt) (pt came in on 9/4 @ 230, but appt was that morning @ 1030, per Michael Litter told patient to r/s) (per inbasket from [facility] move pt to 9/5, but per son Elta Gu[place] pt is having surgery on 9/5, perJulie told him to keep appt on 9/12 and call back to r/s if she needs to be seen sooner. TT

## 2016-12-22 DIAGNOSIS — C249 Malignant neoplasm of biliary tract, unspecified: Secondary | ICD-10-CM | POA: Diagnosis not present

## 2016-12-22 DIAGNOSIS — C221 Intrahepatic bile duct carcinoma: Secondary | ICD-10-CM | POA: Diagnosis not present

## 2016-12-22 DIAGNOSIS — T85520A Displacement of bile duct prosthesis, initial encounter: Secondary | ICD-10-CM | POA: Diagnosis not present

## 2016-12-25 DIAGNOSIS — J449 Chronic obstructive pulmonary disease, unspecified: Secondary | ICD-10-CM | POA: Diagnosis not present

## 2016-12-27 ENCOUNTER — Inpatient Hospital Stay: Payer: PPO | Admitting: Oncology

## 2016-12-28 DIAGNOSIS — C249 Malignant neoplasm of biliary tract, unspecified: Secondary | ICD-10-CM | POA: Diagnosis not present

## 2016-12-28 DIAGNOSIS — R7989 Other specified abnormal findings of blood chemistry: Secondary | ICD-10-CM | POA: Diagnosis not present

## 2016-12-28 DIAGNOSIS — Z5111 Encounter for antineoplastic chemotherapy: Secondary | ICD-10-CM | POA: Diagnosis not present

## 2016-12-28 DIAGNOSIS — D649 Anemia, unspecified: Secondary | ICD-10-CM | POA: Diagnosis not present

## 2016-12-28 DIAGNOSIS — Z515 Encounter for palliative care: Secondary | ICD-10-CM | POA: Diagnosis not present

## 2016-12-28 DIAGNOSIS — M329 Systemic lupus erythematosus, unspecified: Secondary | ICD-10-CM | POA: Diagnosis not present

## 2016-12-28 DIAGNOSIS — R5383 Other fatigue: Secondary | ICD-10-CM | POA: Diagnosis not present

## 2016-12-28 DIAGNOSIS — E042 Nontoxic multinodular goiter: Secondary | ICD-10-CM | POA: Diagnosis not present

## 2016-12-28 DIAGNOSIS — T451X5A Adverse effect of antineoplastic and immunosuppressive drugs, initial encounter: Secondary | ICD-10-CM | POA: Diagnosis not present

## 2016-12-28 DIAGNOSIS — H538 Other visual disturbances: Secondary | ICD-10-CM | POA: Diagnosis not present

## 2016-12-28 DIAGNOSIS — D6481 Anemia due to antineoplastic chemotherapy: Secondary | ICD-10-CM | POA: Diagnosis not present

## 2016-12-29 ENCOUNTER — Encounter: Payer: Self-pay | Admitting: Oncology

## 2016-12-29 ENCOUNTER — Inpatient Hospital Stay: Payer: PPO

## 2016-12-29 ENCOUNTER — Inpatient Hospital Stay: Payer: PPO | Attending: Oncology | Admitting: Oncology

## 2016-12-29 ENCOUNTER — Encounter (INDEPENDENT_AMBULATORY_CARE_PROVIDER_SITE_OTHER): Payer: Self-pay

## 2016-12-29 VITALS — BP 121/72 | HR 103 | Temp 97.8°F | Ht 62.25 in | Wt 145.2 lb

## 2016-12-29 DIAGNOSIS — Z5111 Encounter for antineoplastic chemotherapy: Secondary | ICD-10-CM | POA: Diagnosis not present

## 2016-12-29 DIAGNOSIS — J449 Chronic obstructive pulmonary disease, unspecified: Secondary | ICD-10-CM | POA: Insufficient documentation

## 2016-12-29 DIAGNOSIS — E042 Nontoxic multinodular goiter: Secondary | ICD-10-CM | POA: Insufficient documentation

## 2016-12-29 DIAGNOSIS — H538 Other visual disturbances: Secondary | ICD-10-CM | POA: Diagnosis not present

## 2016-12-29 DIAGNOSIS — K219 Gastro-esophageal reflux disease without esophagitis: Secondary | ICD-10-CM | POA: Diagnosis not present

## 2016-12-29 DIAGNOSIS — Z79899 Other long term (current) drug therapy: Secondary | ICD-10-CM | POA: Insufficient documentation

## 2016-12-29 DIAGNOSIS — Z8719 Personal history of other diseases of the digestive system: Secondary | ICD-10-CM | POA: Insufficient documentation

## 2016-12-29 DIAGNOSIS — M329 Systemic lupus erythematosus, unspecified: Secondary | ICD-10-CM | POA: Insufficient documentation

## 2016-12-29 DIAGNOSIS — Z7984 Long term (current) use of oral hypoglycemic drugs: Secondary | ICD-10-CM | POA: Diagnosis not present

## 2016-12-29 DIAGNOSIS — D509 Iron deficiency anemia, unspecified: Secondary | ICD-10-CM | POA: Diagnosis not present

## 2016-12-29 DIAGNOSIS — M5136 Other intervertebral disc degeneration, lumbar region: Secondary | ICD-10-CM | POA: Insufficient documentation

## 2016-12-29 DIAGNOSIS — Z7689 Persons encountering health services in other specified circumstances: Secondary | ICD-10-CM | POA: Diagnosis not present

## 2016-12-29 DIAGNOSIS — H9109 Ototoxic hearing loss, unspecified ear: Secondary | ICD-10-CM | POA: Insufficient documentation

## 2016-12-29 DIAGNOSIS — F329 Major depressive disorder, single episode, unspecified: Secondary | ICD-10-CM | POA: Insufficient documentation

## 2016-12-29 DIAGNOSIS — T451X5A Adverse effect of antineoplastic and immunosuppressive drugs, initial encounter: Secondary | ICD-10-CM | POA: Diagnosis not present

## 2016-12-29 DIAGNOSIS — D696 Thrombocytopenia, unspecified: Secondary | ICD-10-CM | POA: Diagnosis not present

## 2016-12-29 DIAGNOSIS — Z87891 Personal history of nicotine dependence: Secondary | ICD-10-CM

## 2016-12-29 DIAGNOSIS — C221 Intrahepatic bile duct carcinoma: Secondary | ICD-10-CM | POA: Diagnosis not present

## 2016-12-29 DIAGNOSIS — E119 Type 2 diabetes mellitus without complications: Secondary | ICD-10-CM | POA: Diagnosis not present

## 2016-12-29 DIAGNOSIS — E509 Vitamin A deficiency, unspecified: Secondary | ICD-10-CM

## 2016-12-29 DIAGNOSIS — R21 Rash and other nonspecific skin eruption: Secondary | ICD-10-CM | POA: Diagnosis not present

## 2016-12-29 DIAGNOSIS — D6481 Anemia due to antineoplastic chemotherapy: Secondary | ICD-10-CM

## 2016-12-29 DIAGNOSIS — Z7189 Other specified counseling: Secondary | ICD-10-CM

## 2016-12-29 DIAGNOSIS — Z862 Personal history of diseases of the blood and blood-forming organs and certain disorders involving the immune mechanism: Secondary | ICD-10-CM

## 2016-12-29 DIAGNOSIS — K5190 Ulcerative colitis, unspecified, without complications: Secondary | ICD-10-CM | POA: Diagnosis not present

## 2016-12-29 DIAGNOSIS — I10 Essential (primary) hypertension: Secondary | ICD-10-CM | POA: Diagnosis not present

## 2016-12-29 LAB — CBC WITH DIFFERENTIAL/PLATELET
BASOS PCT: 0 %
Basophils Absolute: 0 10*3/uL (ref 0–0.1)
EOS ABS: 0 10*3/uL (ref 0–0.7)
EOS PCT: 0 %
HCT: 30.5 % — ABNORMAL LOW (ref 35.0–47.0)
HEMOGLOBIN: 10.1 g/dL — AB (ref 12.0–16.0)
LYMPHS ABS: 0.3 10*3/uL — AB (ref 1.0–3.6)
Lymphocytes Relative: 4 %
MCH: 29.3 pg (ref 26.0–34.0)
MCHC: 33 g/dL (ref 32.0–36.0)
MCV: 88.9 fL (ref 80.0–100.0)
MONO ABS: 0.5 10*3/uL (ref 0.2–0.9)
MONOS PCT: 9 %
NEUTROS PCT: 87 %
Neutro Abs: 5.4 10*3/uL (ref 1.4–6.5)
Platelets: 201 10*3/uL (ref 150–440)
RBC: 3.43 MIL/uL — ABNORMAL LOW (ref 3.80–5.20)
RDW: 20.8 % — AB (ref 11.5–14.5)
WBC: 6.2 10*3/uL (ref 3.6–11.0)

## 2016-12-29 LAB — COMPREHENSIVE METABOLIC PANEL
ALBUMIN: 3.2 g/dL — AB (ref 3.5–5.0)
ALK PHOS: 365 U/L — AB (ref 38–126)
ALT: 107 U/L — ABNORMAL HIGH (ref 14–54)
ANION GAP: 11 (ref 5–15)
AST: 112 U/L — ABNORMAL HIGH (ref 15–41)
BUN: 19 mg/dL (ref 6–20)
CALCIUM: 9.2 mg/dL (ref 8.9–10.3)
CO2: 26 mmol/L (ref 22–32)
Chloride: 97 mmol/L — ABNORMAL LOW (ref 101–111)
Creatinine, Ser: 0.73 mg/dL (ref 0.44–1.00)
GFR calc non Af Amer: >60 mL/min (ref >60)
Glucose, Bld: 255 mg/dL — ABNORMAL HIGH (ref 65–99)
POTASSIUM: 4.8 mmol/L (ref 3.5–5.1)
SODIUM: 134 mmol/L — AB (ref 135–145)
TOTAL PROTEIN: 7 g/dL (ref 6.5–8.1)
Total Bilirubin: 0.8 mg/dL (ref 0.3–1.2)

## 2016-12-29 NOTE — Progress Notes (Signed)
Patient here today as a new patient  

## 2016-12-29 NOTE — Progress Notes (Signed)
START OFF PATHWAY REGIMEN - [Other Dx]   [id number]:Gemcitabine + Cisplatin - Cholangiocarcinoma q21 Days:   A cycle is every 21 days:     Gemcitabine      Cisplatin   **Always confirm dose/schedule in your pharmacy ordering system**    Patient Characteristics: Intent of Therapy: Non-Curative / Palliative Intent, Discussed with Patient

## 2016-12-29 NOTE — Progress Notes (Signed)
Hematology/Oncology Consult note [facility] Telephone:([phone] [phone]  CONSULT NOTE Patient Care Team: Idelle Crouch, MD as PCP - General (Internal Medicine)  CHIEF COMPLAINTS/PURPOSE OF CONSULTATION:  I have cancer and want to transfer my care to [facility].    HISTORY OF PRESENTING ILLNESS:  Regina Hood 76 y.o.  female with PMH listed as below, most significantly for cholangiocarcinoma who has been on chemotherapy treatment presents to transfer her cancer care to our cancer center.  Pertinent Cancer History.  1. Initially presented in 07/2016 to an outside ED with lower and upper GI bleed.  A. She was found to have new LFTs elevations including AST 340, ALT 379, Alk phos 420 and t bili 0.9.   B. [place] was obtained, which did not visualize the left lobe.  C. She was evaluated by gastroenterology because of her history of UC and underwent colonoscopy, which was unremarkable per path report.  2. 08/20/16, CT noted diffuse atrophy of the left lobe with left intrahepatic biliary ductal dilatation, with 4.1 cm ill-defined low-attenuation mass. This mass was suspicious for malignancy.  3. 08/2016, MRI/MRCP which showed abnormal left hepatic lobe with diffuse biliary dilatation with a shrunken left hepatic lobe and a 2.42.9 cm oval-shaped masslike component displacing the adjacent bile ducts. There is an approximately 1.6 cm segment truncated bile ducts involving common hepatic duct and right and left hepatic although no dilatation of the right hepatic duct system was seen.   4. 09/02/16, ERCP showed localized biliary stricture in the left intrahepatic duct with dilation of the left intrahepatic branches. Sphincterotomy was performed and a stent was placed. EUS with a 3.3 cm mass and left duct dilation.  5. Repeat ERCP was done 09/10/16, during which the stent was exchanged.  6. 09/20/16, taken to OR and noted to a have an peritoneal implant with biopsy c/w  adenocarcinoma. The neoplastic cells are positive for CK7. They are negative for CK20, CDX-2, TTF-1, Napsin A, hep-par1, glypican 3, and arginase. The immunohistochemical features are consistent with adenocarcinoma of upper gastrointestinal tract or pancreaticobiliary origin.  7. 09/28/16, initially seen in medical oncology clinic by Dr. Filomena Jungling 8. 09/29/16, admitted for biliary obstruction and cholangitis, s/p PBD placement, d/c 10/06/16 9. 10/12/16, initiated on gemcitabine (400 mg/m2). CA 19-9 167 10. 10/26/16, increased gemcitabine (800 mg/m2) 11. 11/23/2016 Cycle 3 Gemzar (873m/m2), 12/07/2016, Gemzar (8061mm2) added cisplatin 2567m2  12. 12/14/2016, CT CAP shows slight interval decrease in size of L hepatic lobe cholangio. Slight interval increase in size of porta hepatis lymph node whiel other nodes are slightly smaller. Multiple bilateral thyroid nodules. Stable mediastinal lymph nodes. CA 19-9 60 14  8/30 Cycle 4 Gemzar (800m48m) added cisplatin 20mg9m 13, 12/28/2016 Gemcitabine 800mg/24mcisplatin was hold due to anemia. S/p 1 PRBC transfusion.   Due to transportation concerns, patient would like to transfer her care to us. ShKorealives by herself, and is able to do her ADLs. [place] has two [place] close to [place] and comes to help her if she needs. She drives herself to today's appointment. She takes oxycodone 5mg as34meded for pain, and tramadol as well. She has a history of lupus, for which she takes plaquenil and disease is under control. When she was started on chemotherapy, plaquenil was stopped and she broke out skin rashes. She was restarted on plaquenil and also has been on dexamethasone 4mg dai94mfor the past few weeks.  Her rash has  improved. She complains about asomnia since she is on steroid. Also blurry vision. Takes [place] as needed.   Today she denies sob, chest pain, abdominal pain, diarrhea. Her last chemotherapy was yesterday and she got 1  unit of PRBCs.   ROS:  Review of Systems  Constitutional: Positive for fatigue.  HENT:  Negative.   Eyes: Negative.   Respiratory: Negative.   Cardiovascular: Negative.   Gastrointestinal:        bilateral PBDs exchanged recently, capped for internal draining.   Endocrine: Negative.   Genitourinary: Negative.    Musculoskeletal: Negative.   Skin: Negative.   Neurological: Negative.   Hematological: Negative.   Psychiatric/Behavioral: Negative.     MEDICAL HISTORY:  Past Medical History:  Diagnosis Date  . Arthritis   . Cancer ([place])   . Cellulitis   . COPD (chronic obstructive pulmonary disease) ([place])   . DDD (degenerative disc disease), lumbar   . Depression   . Diabetes mellitus   . Diverticulitis   . GERD (gastroesophageal reflux disease)   . GI bleed   . Headache(784.0)   . Hypertension   . Lupus   . Ulcerative colitis ([place])     SURGICAL HISTORY: Past Surgical History:  Procedure Laterality Date  . ABDOMINAL HYSTERECTOMY    . BREAST BIOPSY Right   . COLONOSCOPY    . COLONOSCOPY WITH PROPOFOL N/A 08/18/2016   Procedure: COLONOSCOPY WITH PROPOFOL;  Surgeon: Manya Silvas, MD;  Location: [facility] ENDOSCOPY;  Service: Endoscopy;  Laterality: N/A;  . ESOPHAGOGASTRODUODENOSCOPY (EGD) WITH PROPOFOL N/A 08/18/2016   Procedure: ESOPHAGOGASTRODUODENOSCOPY (EGD) WITH PROPOFOL;  Surgeon: Manya Silvas, MD;  Location: [facility] ENDOSCOPY;  Service: Endoscopy;  Laterality: N/A;  . ORIF HUMERUS FRACTURE  09/17/2011   Procedure: OPEN REDUCTION INTERNAL FIXATION (ORIF) PROXIMAL HUMERUS FRACTURE;  Surgeon: Augustin Schooling, MD;  Location: [place];  Service: Orthopedics;  Laterality: Left;    SOCIAL HISTORY: Social History   Social History  . Marital status: Widowed    Spouse name: N/A  . Number of children: N/A  . Years of education: N/A   Occupational History  . Not on file.   Social History Main Topics  . Smoking status: Former Smoker    Packs/day: 1.00    Years: 10.00     Types: Cigarettes  . Smokeless tobacco: Never Used     Comment: quit smoking in 2003  . Alcohol use No  . Drug use: No  . Sexual activity: Not on file   Other Topics Concern  . Not on file   Social History Narrative  . No narrative on file    FAMILY HISTORY: Family History  Problem Relation Age of Onset  . Breast cancer Mother 15    ALLERGIES:  is allergic to feldene [piroxicam]; penicillamine; and qualaquin [quinine].  MEDICATIONS:  Current Outpatient Prescriptions  Medication Sig Dispense Refill  . acetaminophen (TYLENOL) 500 MG tablet Take 1,000 mg by mouth 3 (three) times daily.    Marland Kitchen amLODipine (NORVASC) 5 MG tablet Take 5 mg by mouth daily.    . [employer] STOOL SOFTENER 8.6-50 MG tablet Take 1 tablet by mouth daily.  11  . estradiol (ESTRACE) 1 MG tablet Take 1 mg by mouth daily.    Marland Kitchen gabapentin (NEURONTIN) 100 MG capsule Take 100 mg by mouth 3 (three) times daily.     Marland Kitchen KLOR-CON 10 10 MEQ tablet Take 2 tablets by mouth daily.  0  . metFORMIN (GLUCOPHAGE) 500 MG tablet Take 500  mg by mouth 2 (two) times daily with a meal.    . ondansetron (ZOFRAN) 8 MG tablet Take 8 mg by mouth every 12 (twelve) hours as needed for nausea/vomiting.  2  . oxyCODONE (OXY IR/ROXICODONE) 5 MG immediate release tablet Take 5 mg by mouth every 4 (four) hours as needed for pain.  0  . pantoprazole (PROTONIX) 40 MG tablet Take 40 mg by mouth daily.    . Sodium Chloride Flush (NORMAL SALINE FLUSH) 0.9 % SOLN 1 mL by Other route 2 (two) times daily. Flush each biliary drain with 1 mL twice daily  0  . traMADol (ULTRAM) 50 MG tablet Take 50 mg by mouth 2 (two) times daily.     Marland Kitchen venlafaxine XR (EFFEXOR-XR) 75 MG 24 hr capsule Take 225 mg by mouth daily.      No current facility-administered medications for this visit.       Marland Kitchen  PHYSICAL EXAMINATION: ECOG PERFORMANCE STATUS: 1 - Symptomatic but completely ambulatory Vitals:   12/29/16 1524  BP: 121/72  Pulse: (!) 103  Temp: 97.8 F (36.6 C)    Filed Weights   12/29/16 1524  Weight: 145 lb 3 oz (65.9 kg)    GENERAL:Alert, no distress and comfortable.  EYES: no pallor or icterus OROPHARYNX: no thrush or ulceration; good dentition  NECK: supple, no masses felt LYMPH:  no palpable lymphadenopathy in the cervical, axillary or inguinal regions LUNGS: clear to auscultation and  No wheeze or crackles HEART/CVS: regular rate & rhythm and no murmurs; No lower extremity edema ABDOMEN: abdomen soft, non-tender and normal bowel sounds. 2 PBD tubes with sutures. Dressing was dry.  Musculoskeletal:no cyanosis of digits and no clubbing  PSYCH: alert & oriented x 3  NEURO: no focal motor/sensory deficits SKIN:  Rashes on her anterior chest wall.   LABORATORY DATA:  I have reviewed the data as listed Lab Results  Component Value Date   WBC 6.9 11/13/2016   HGB 9.2 (L) 11/13/2016   HCT 27.9 (L) 11/13/2016   MCV 81.6 11/13/2016   PLT 216 11/13/2016    Recent Labs  07/05/16 1815 08/06/16 1151 11/13/16 0019  NA 136 135 133*  K 3.9 3.9 3.5  CL 100* 99* 100*  CO2 29 26 22   GLUCOSE 125* 160* 132*  BUN 9 12 11   CREATININE 0.45 0.47 0.64  CALCIUM 8.9 9.7 7.9*  GFRNONAA >60 >60 >60  GFRAA >60 >60 >60  PROT 7.0 7.7 7.2  ALBUMIN 3.5 4.0 3.1*  AST 31 340* 53*  ALT 31 379* 41  ALKPHOS 123 420* 265*  BILITOT 0.3 0.9 0.8   [facility] CA 19-9   09/14/16 220 --> 09/28/16 176--> 10/12/16 167-->12/07/2016 64-->12/14/2016 60.   RADIOGRAPHIC STUDIES: I have personally reviewed the radiological images as listed and agreed with the findings in the report. CT 12/14/2016 [facility] Impression: 1. Slight interval decrease in size of the left hepatic lobe cholangiocarcinoma. 2. Slight interval increase in size of a porta hepatis lymph node. Other nodes are slightly smaller. 3. Multiple bilateral thyroid nodules. Recommend ultrasound for further evaluation. 4. Stable mediastinal lymph nodes.  ASSESSMENT & PLAN:  Cancer  Staging Cholangiocarcinoma [facility]) Staging form: Intrahepatic Bile Duct, AJCC 8th Edition - Clinical stage from 12/29/2016: Stage IV (cT1, cNX, pM1) - Signed by Earlie Server, MD on 12/29/2016  1. Cholangiocarcinoma ([place])   2. Systemic lupus erythematosus, unspecified SLE type, unspecified organ involvement status ([place])   3. History of iron deficiency anemia  4. History of ulcerative colitis   5. Anemia due to antineoplastic chemotherapy   6. Goals of care, counseling/discussion    # I explained to the patient the risks and benefits of continuing chemotherapy with gemcitabine +/- cisplatin including all but not limited to hearing loss, mouth sore, nausea, vomiting, low blood counts, bleeding, and risk of life threatening infection and even death, secondary malignancy etc Patient is willing to continue on chemotherapy and prefers to have her chemotherapy at [facility].  # goal of care is palliative intent, discussed with patient. Encourage patient to inform her family members about her end of life wishes.  # Chemotherapy education was offered and she declines as she has already had 3-4 cycle of chemotherapy at [facility]. She has a medi port.  Plan Cycle 4 Day 8 gemcitabine 843m/m2 +/- cisplatin 25 mg/m2 on 01/04/2017.   Baseline CBC, CMP, CA19-9  # Antiemetics-Zofran and Compazine; EMLA cream sent to pharmacy # Pain is controlled. Patient reports having enough medication at home. All questions were answered. The patient knows to call the clinic with any problems questions or concerns.  Return of visit: 01/04/2017 Lab/MD prior to chemotherapy. Thank you for this kind referral and the opportunity to participate in the care of this patient. A copy of today's note is routed to referring provider Dr.David HMike Gip MD, PhD [facility]ematology [facility]ager- [phone]/03/2017

## 2016-12-30 ENCOUNTER — Other Ambulatory Visit: Payer: Self-pay | Admitting: *Deleted

## 2016-12-30 LAB — CANCER ANTIGEN 19-9: CAN 19-9: 87 U/mL — AB (ref 0–35)

## 2016-12-30 MED ORDER — ZOLPIDEM TARTRATE 10 MG PO TABS: 10.0000 mg | ORAL_TABLET | Freq: Every evening | ORAL | 3 refills | Status: DC | PRN

## 2016-12-30 MED ORDER — CVS STOOL SOFTENER 8.6-50 MG PO TABS: 1.0000 | ORAL_TABLET | Freq: Every day | ORAL | 3 refills | Status: DC

## 2016-12-30 MED ORDER — ONDANSETRON HCL 8 MG PO TABS: 8.0000 mg | ORAL_TABLET | Freq: Two times a day (BID) | ORAL | 2 refills | Status: DC | PRN

## 2016-12-30 NOTE — Addendum Note (Signed)
Addended by: Betti Cruz on: 12/30/2016 02:16 PM   Modules accepted: Orders

## 2017-01-02 ENCOUNTER — Other Ambulatory Visit: Payer: Self-pay | Admitting: Oncology

## 2017-01-04 ENCOUNTER — Inpatient Hospital Stay: Payer: PPO

## 2017-01-04 ENCOUNTER — Inpatient Hospital Stay: Payer: PPO | Admitting: Oncology

## 2017-01-04 ENCOUNTER — Other Ambulatory Visit: Payer: Self-pay | Admitting: *Deleted

## 2017-01-04 DIAGNOSIS — C221 Intrahepatic bile duct carcinoma: Secondary | ICD-10-CM

## 2017-01-05 ENCOUNTER — Encounter: Payer: Self-pay | Admitting: Oncology

## 2017-01-05 ENCOUNTER — Inpatient Hospital Stay (HOSPITAL_BASED_OUTPATIENT_CLINIC_OR_DEPARTMENT_OTHER): Payer: PPO | Admitting: Oncology

## 2017-01-05 ENCOUNTER — Inpatient Hospital Stay: Payer: PPO

## 2017-01-05 VITALS — BP 159/80 | HR 101 | Temp 97.5°F | Wt 148.0 lb

## 2017-01-05 DIAGNOSIS — J449 Chronic obstructive pulmonary disease, unspecified: Secondary | ICD-10-CM

## 2017-01-05 DIAGNOSIS — H538 Other visual disturbances: Secondary | ICD-10-CM | POA: Diagnosis not present

## 2017-01-05 DIAGNOSIS — I10 Essential (primary) hypertension: Secondary | ICD-10-CM | POA: Diagnosis not present

## 2017-01-05 DIAGNOSIS — M5136 Other intervertebral disc degeneration, lumbar region: Secondary | ICD-10-CM

## 2017-01-05 DIAGNOSIS — C221 Intrahepatic bile duct carcinoma: Secondary | ICD-10-CM | POA: Diagnosis not present

## 2017-01-05 DIAGNOSIS — T451X5A Adverse effect of antineoplastic and immunosuppressive drugs, initial encounter: Secondary | ICD-10-CM

## 2017-01-05 DIAGNOSIS — E042 Nontoxic multinodular goiter: Secondary | ICD-10-CM | POA: Diagnosis not present

## 2017-01-05 DIAGNOSIS — E119 Type 2 diabetes mellitus without complications: Secondary | ICD-10-CM

## 2017-01-05 DIAGNOSIS — Z79899 Other long term (current) drug therapy: Secondary | ICD-10-CM

## 2017-01-05 DIAGNOSIS — D509 Iron deficiency anemia, unspecified: Secondary | ICD-10-CM | POA: Diagnosis not present

## 2017-01-05 DIAGNOSIS — D6481 Anemia due to antineoplastic chemotherapy: Secondary | ICD-10-CM

## 2017-01-05 DIAGNOSIS — M329 Systemic lupus erythematosus, unspecified: Secondary | ICD-10-CM

## 2017-01-05 DIAGNOSIS — Z7189 Other specified counseling: Secondary | ICD-10-CM

## 2017-01-05 DIAGNOSIS — Z7984 Long term (current) use of oral hypoglycemic drugs: Secondary | ICD-10-CM

## 2017-01-05 DIAGNOSIS — H938X9 Other specified disorders of ear, unspecified ear: Secondary | ICD-10-CM

## 2017-01-05 DIAGNOSIS — Z87891 Personal history of nicotine dependence: Secondary | ICD-10-CM

## 2017-01-05 DIAGNOSIS — K219 Gastro-esophageal reflux disease without esophagitis: Secondary | ICD-10-CM

## 2017-01-05 DIAGNOSIS — F329 Major depressive disorder, single episode, unspecified: Secondary | ICD-10-CM

## 2017-01-05 DIAGNOSIS — R21 Rash and other nonspecific skin eruption: Secondary | ICD-10-CM

## 2017-01-05 DIAGNOSIS — D696 Thrombocytopenia, unspecified: Secondary | ICD-10-CM

## 2017-01-05 DIAGNOSIS — K5190 Ulcerative colitis, unspecified, without complications: Secondary | ICD-10-CM

## 2017-01-05 DIAGNOSIS — Z5111 Encounter for antineoplastic chemotherapy: Secondary | ICD-10-CM | POA: Diagnosis not present

## 2017-01-05 LAB — COMPREHENSIVE METABOLIC PANEL
ALBUMIN: 3.1 g/dL — AB (ref 3.5–5.0)
ALK PHOS: 374 U/L — AB (ref 38–126)
ALT: 84 U/L — AB (ref 14–54)
AST: 91 U/L — ABNORMAL HIGH (ref 15–41)
Anion gap: 9 (ref 5–15)
BUN: 22 mg/dL — ABNORMAL HIGH (ref 6–20)
CALCIUM: 8.9 mg/dL (ref 8.9–10.3)
CO2: 28 mmol/L (ref 22–32)
CREATININE: 0.69 mg/dL (ref 0.44–1.00)
Chloride: 100 mmol/L — ABNORMAL LOW (ref 101–111)
GFR calc Af Amer: >60 mL/min (ref >60)
GFR calc non Af Amer: >60 mL/min (ref >60)
GLUCOSE: 188 mg/dL — AB (ref 65–99)
Potassium: 3.4 mmol/L — ABNORMAL LOW (ref 3.5–5.1)
SODIUM: 137 mmol/L (ref 135–145)
Total Bilirubin: 0.6 mg/dL (ref 0.3–1.2)
Total Protein: 6.5 g/dL (ref 6.5–8.1)

## 2017-01-05 LAB — CBC WITH DIFFERENTIAL/PLATELET
BASOS PCT: 0 %
Basophils Absolute: 0 10*3/uL (ref 0–0.1)
EOS ABS: 0 10*3/uL (ref 0–0.7)
EOS PCT: 1 %
HCT: 26.4 % — ABNORMAL LOW (ref 35.0–47.0)
HEMOGLOBIN: 8.8 g/dL — AB (ref 12.0–16.0)
LYMPHS ABS: 0.9 10*3/uL — AB (ref 1.0–3.6)
Lymphocytes Relative: 21 %
MCH: 29.5 pg (ref 26.0–34.0)
MCHC: 33.3 g/dL (ref 32.0–36.0)
MCV: 88.7 fL (ref 80.0–100.0)
MONOS PCT: 16 %
Monocytes Absolute: 0.7 10*3/uL (ref 0.2–0.9)
NEUTROS PCT: 62 %
Neutro Abs: 2.7 10*3/uL (ref 1.4–6.5)
Platelets: 133 10*3/uL — ABNORMAL LOW (ref 150–440)
RBC: 2.98 MIL/uL — ABNORMAL LOW (ref 3.80–5.20)
RDW: 22 % — ABNORMAL HIGH (ref 11.5–14.5)
WBC: 4.4 10*3/uL (ref 3.6–11.0)

## 2017-01-05 MED ORDER — PALONOSETRON HCL INJECTION 0.25 MG/5ML
0.2500 mg | Freq: Once | INTRAVENOUS | Status: AC
  Administered 2017-01-05: 0.25 mg via INTRAVENOUS
  Filled 2017-01-05: qty 5

## 2017-01-05 MED ORDER — LORAZEPAM 0.5 MG PO TABS: 0.5000 mg | ORAL_TABLET | Freq: Four times a day (QID) | ORAL | 0 refills | Status: DC | PRN

## 2017-01-05 MED ORDER — SODIUM CHLORIDE 0.9 % IV SOLN
1400.0000 mg | Freq: Once | INTRAVENOUS | Status: AC
  Administered 2017-01-05: 1400 mg via INTRAVENOUS
  Filled 2017-01-05: qty 26.3

## 2017-01-05 MED ORDER — FOSAPREPITANT DIMEGLUMINE INJECTION 150 MG
Freq: Once | INTRAVENOUS | Status: AC
  Administered 2017-01-05: 12:00:00 via INTRAVENOUS
  Filled 2017-01-05: qty 5

## 2017-01-05 MED ORDER — SODIUM CHLORIDE 0.9 % IV SOLN
Freq: Once | INTRAVENOUS | Status: AC
  Administered 2017-01-05: 10:00:00 via INTRAVENOUS
  Filled 2017-01-05: qty 1000

## 2017-01-05 MED ORDER — SODIUM CHLORIDE 0.9 % IV SOLN
20.0000 mg/m2 | Freq: Once | INTRAVENOUS | Status: AC
  Administered 2017-01-05: 34 mg via INTRAVENOUS
  Filled 2017-01-05: qty 34

## 2017-01-05 MED ORDER — HEPARIN SOD (PORK) LOCK FLUSH 100 UNIT/ML IV SOLN
500.0000 [IU] | Freq: Once | INTRAVENOUS | Status: AC | PRN
  Administered 2017-01-05: 500 [IU]
  Filled 2017-01-05: qty 5

## 2017-01-05 MED ORDER — DEXAMETHASONE 1 MG PO TABS: 1.0000 mg | ORAL_TABLET | ORAL | 0 refills | Status: DC

## 2017-01-05 MED ORDER — POTASSIUM CHLORIDE 2 MEQ/ML IV SOLN
Freq: Once | INTRAVENOUS | Status: AC
  Administered 2017-01-05: 10:00:00 via INTRAVENOUS
  Filled 2017-01-05: qty 1000

## 2017-01-05 NOTE — Progress Notes (Signed)
Hematology/Oncology Follow up note [facility] [phone] [phone]  CONSULT NOTE Patient Care Team: Idelle Crouch, MD as PCP - General (Internal Medicine)  CHIEF COMPLAINTS/PURPOSE OF CONSULTATION:  I have cancer and want to transfer my care to [facility].    HISTORY OF PRESENTING ILLNESS:  Regina Hood 76 y.o.  female with PMH listed as below, most significantly for cholangiocarcinoma who has been on chemotherapy treatment presents to transfer her cancer care to our cancer center.  Pertinent Cancer History.  1. Initially presented in 07/2016 to an outside ED with lower and upper GI bleed.  A. She was found to have new LFTs elevations including AST 340, ALT 379, Alk phos 420 and t bili 0.9.   B. [place] was obtained, which did not visualize the left lobe.  C. She was evaluated by gastroenterology because of her history of UC and underwent colonoscopy, which was unremarkable per path report.  2. 08/20/16, CT noted diffuse atrophy of the left lobe with left intrahepatic biliary ductal dilatation, with 4.1 cm ill-defined low-attenuation mass. This mass was suspicious for malignancy.  3. 08/2016, MRI/MRCP which showed abnormal left hepatic lobe with diffuse biliary dilatation with a shrunken left hepatic lobe and a 2.42.9 cm oval-shaped masslike component displacing the adjacent bile ducts. There is an approximately 1.6 cm segment truncated bile ducts involving common hepatic duct and right and left hepatic although no dilatation of the right hepatic duct system was seen.   4. 09/02/16, ERCP showed localized biliary stricture in the left intrahepatic duct with dilation of the left intrahepatic branches. Sphincterotomy was performed and a stent was placed. EUS with a 3.3 cm mass and left duct dilation.  5. Repeat ERCP was done 09/10/16, during which the stent was exchanged.  6. 09/20/16, taken to OR and noted to a have an peritoneal implant with biopsy c/w  adenocarcinoma. The neoplastic cells are positive for CK7. They are negative for CK20, CDX-2, TTF-1, Napsin A, hep-par1, glypican 3, and arginase. The immunohistochemical features are consistent with adenocarcinoma of upper gastrointestinal tract or pancreaticobiliary origin.  7. 09/28/16, initially seen in medical oncology clinic by Dr. Filomena Jungling 8. 09/29/16, admitted for biliary obstruction and cholangitis, s/p PBD placement, d/c 10/06/16 9. 10/12/16, initiated on gemcitabine (400 mg/m2). CA 19-9 167 10. 10/26/16, increased gemcitabine (800 mg/m2) 11. 11/23/2016 Cycle 3 Gemzar (825m/m2), 12/07/2016, Gemzar (8015mm2) added cisplatin 2552m2  12. 12/14/2016, CT CAP shows slight interval decrease in size of L hepatic lobe cholangio. Slight interval increase in size of porta hepatis lymph node whiel other nodes are slightly smaller. Multiple bilateral thyroid nodules. Stable mediastinal lymph nodes. CA 19-9 60 14  8/30 Cycle 4 Gemzar (800m25m) added cisplatin 20mg14m 13, 12/28/2016 Gemcitabine 800mg/46mcisplatin was hold due to anemia. S/p 1 PRBC transfusion.   Due to transportation concerns, patient would like to transfer her care to us. ShKorealives by herself, and is able to do her ADLs. [place] has two [place] close to [place] and comes to help her if she needs. She drives herself to today's appointment. She takes oxycodone 5mg as4meded for pain, and tramadol as well. She has a history of lupus, for which she takes plaquenil and disease is under control. When she was started on chemotherapy, plaquenil was stopped and she broke out skin rashes. She was restarted on plaquenil and also has been on dexamethasone 4mg dai42mfor the past few weeks.  Her rash  has improved. She complains about asomnia since she is on steroid. Also blurry vision. Takes [place] as needed.   INTERVAL HISTORY Patient presents for evaluation prior to her cycle 4 Day 8 Gemcitabine Cisplatin treatment.  She forgot her appointment yesterday and today is Day 9 of cycle 4. Today she denies sob, chest pain, abdominal pain, diarrhea. Her last chemotherapy was 9/11 by [facility] and she got 1 unit of PRBCs due to symptomatic anemia.  Overall she feel well, without any new complaints.   ROS:  Review of Systems  Constitutional: Positive for fatigue.  HENT:  Negative.   Eyes: Negative.   Respiratory: Negative.   Cardiovascular: Negative.   Gastrointestinal:        bilateral PBDs exchanged recently, capped for internal draining.   Endocrine: Negative.   Genitourinary: Negative.    Musculoskeletal: Negative.   Skin: Negative.   Neurological: Negative.   Hematological: Negative.   Psychiatric/Behavioral: Negative.     MEDICAL HISTORY:  Past Medical History:  Diagnosis Date  . Arthritis   . Cancer ([place])   . Cellulitis   . COPD (chronic obstructive pulmonary disease) ([place])   . DDD (degenerative disc disease), lumbar   . Depression   . Diabetes mellitus   . Diverticulitis   . GERD (gastroesophageal reflux disease)   . GI bleed   . Headache(784.0)   . Hypertension   . Lupus   . Ulcerative colitis ([place])     SURGICAL HISTORY: Past Surgical History:  Procedure Laterality Date  . ABDOMINAL HYSTERECTOMY    . BREAST BIOPSY Right   . COLONOSCOPY    . COLONOSCOPY WITH PROPOFOL N/A 08/18/2016   Procedure: COLONOSCOPY WITH PROPOFOL;  Surgeon: Manya Silvas, MD;  Location: [facility] ENDOSCOPY;  Service: Endoscopy;  Laterality: N/A;  . ESOPHAGOGASTRODUODENOSCOPY (EGD) WITH PROPOFOL N/A 08/18/2016   Procedure: ESOPHAGOGASTRODUODENOSCOPY (EGD) WITH PROPOFOL;  Surgeon: Manya Silvas, MD;  Location: [facility] ENDOSCOPY;  Service: Endoscopy;  Laterality: N/A;  . ORIF HUMERUS FRACTURE  09/17/2011   Procedure: OPEN REDUCTION INTERNAL FIXATION (ORIF) PROXIMAL HUMERUS FRACTURE;  Surgeon: Augustin Schooling, MD;  Location: [place];  Service: Orthopedics;  Laterality: Left;    SOCIAL HISTORY: Social History   Social  History  . Marital status: Widowed    Spouse name: N/A  . Number of children: N/A  . Years of education: N/A   Occupational History  . Not on file.   Social History Main Topics  . Smoking status: Former Smoker    Packs/day: 1.00    Years: 10.00    Types: Cigarettes    Quit date: 12/30/1998  . Smokeless tobacco: Never Used     Comment: quit smoking in 2003  . Alcohol use No  . Drug use: No  . Sexual activity: Not on file   Other Topics Concern  . Not on file   Social History Narrative  . No narrative on file    FAMILY HISTORY: Family History  Problem Relation Age of Onset  . Breast cancer Mother 13  . COPD Father   . Prostate cancer Brother     ALLERGIES:  is allergic to feldene [piroxicam]; penicillamine; and qualaquin [quinine].  MEDICATIONS:  Current Outpatient Prescriptions  Medication Sig Dispense Refill  . acetaminophen (TYLENOL) 500 MG tablet Take 1,000 mg by mouth 3 (three) times daily.    Marland Kitchen amLODipine (NORVASC) 5 MG tablet Take 5 mg by mouth daily.    Marland Kitchen azelastine (ASTELIN) 0.1 % nasal spray Place into the nose.    Marland Kitchen  calcium carbonate 100 mg/ml SUSP Take by mouth.    . clobetasol ointment (TEMOVATE) 0.05 % Apply topically.    . [employer] STOOL SOFTENER 8.6-50 MG tablet Take 1 tablet by mouth daily. 90 tablet 3  . Cyanocobalamin (VITAMIN B-12 PO) Take 1,000 mcg by mouth daily.    Marland Kitchen dexamethasone (DECADRON) 4 MG tablet Take 4 mg by mouth daily.    Marland Kitchen estradiol (ESTRACE) 1 MG tablet Take 1 mg by mouth daily.    Marland Kitchen gabapentin (NEURONTIN) 100 MG capsule Take 100 mg by mouth 3 (three) times daily.     . hydroxychloroquine (PLAQUENIL) 200 MG tablet Take by mouth.    Marland Kitchen KLOR-CON 10 10 MEQ tablet Take 2 tablets by mouth daily.  0  . metFORMIN (GLUCOPHAGE) 500 MG tablet Take 500 mg by mouth 2 (two) times daily with a meal.    . ondansetron (ZOFRAN) 8 MG tablet Take 1 tablet (8 mg total) by mouth every 12 (twelve) hours as needed. 30 tablet 2  . oxyCODONE (OXY IR/ROXICODONE)  5 MG immediate release tablet Take 5 mg by mouth every 4 (four) hours as needed for pain.  0  . pantoprazole (PROTONIX) 40 MG tablet Take 40 mg by mouth daily.    . polyethylene glycol powder (GLYCOLAX/MIRALAX) powder     . prochlorperazine (COMPAZINE) 10 MG tablet Take 10 mg by mouth every 6 (six) hours as needed.    . Sodium Chloride Flush (NORMAL SALINE FLUSH) 0.9 % SOLN 1 mL by Other route 2 (two) times daily. Flush each biliary drain with 1 mL twice daily  0  . traMADol (ULTRAM) 50 MG tablet Take 50 mg by mouth 2 (two) times daily.     Marland Kitchen venlafaxine XR (EFFEXOR-XR) 75 MG 24 hr capsule Take 225 mg by mouth daily.     Marland Kitchen zolpidem (AMBIEN) 10 MG tablet Take 1 tablet (10 mg total) by mouth at bedtime as needed. 30 tablet 3   No current facility-administered medications for this visit.       Marland Kitchen  PHYSICAL EXAMINATION: ECOG PERFORMANCE STATUS: 1 - Symptomatic but completely ambulatory Vitals:   01/05/17 0849  BP: (!) 159/80  Pulse: (!) 101  Temp: (!) 97.5 F (36.4 C)   Filed Weights   01/05/17 0849  Weight: 148 lb (67.1 kg)    GENERAL:Alert, no distress and comfortable.  EYES: no pallor or icterus OROPHARYNX: no thrush or ulceration; good dentition  NECK: supple, no masses felt LYMPH:  no palpable lymphadenopathy in the cervical, axillary or inguinal regions LUNGS: clear to auscultation and  No wheeze or crackles HEART/CVS: regular rate & rhythm and no murmurs; No lower extremity edema ABDOMEN: abdomen soft, non-tender and normal bowel sounds. 2 PBD tubes with sutures. Dressing was dry.  Musculoskeletal:no cyanosis of digits and no clubbing  PSYCH: alert & oriented x 3  NEURO: no focal motor/sensory deficits SKIN:  Rashes on her anterior chest wall.   LABORATORY DATA:  I have reviewed the data as listed Lab Results  Component Value Date   WBC 6.2 12/29/2016   HGB 10.1 (L) 12/29/2016   HCT 30.5 (L) 12/29/2016   MCV 88.9 12/29/2016   PLT 201 12/29/2016    Recent Labs   08/06/16 1151 11/13/16 0019 12/29/16 1605  NA 135 133* 134*  K 3.9 3.5 4.8  CL 99* 100* 97*  CO2 _0 GLUCOSE 160* 132* 255*  BUN _1 CREATININE 0.47 0.64 0.73  CALCIUM 9.7 7.9*  9.2  GFRNONAA >60 >60 >60  GFRAA >60 >60 >60  PROT 7.7 7.2 7.0  ALBUMIN 4.0 3.1* 3.2*  AST 340* 53* 112*  ALT 379* 41 107*  ALKPHOS 420* 265* 365*  BILITOT 0.9 0.8 0.8   [facility] CA 19-9   09/14/16 220 --> 09/28/16 176--> 10/12/16 167-->12/07/2016 64-->12/14/2016 60. --> 12/29/2016 87  RADIOGRAPHIC STUDIES: I have personally reviewed the radiological images as listed and agreed with the findings in the report. CT 12/14/2016 [facility] Impression: 1. Slight interval decrease in size of the left hepatic lobe cholangiocarcinoma. 2. Slight interval increase in size of a porta hepatis lymph node. Other nodes are slightly smaller. 3. Multiple bilateral thyroid nodules. Recommend ultrasound for further evaluation. 4. Stable mediastinal lymph nodes.  ASSESSMENT & PLAN:  Cancer Staging Cholangiocarcinoma [facility]) Staging form: Intrahepatic Bile Duct, AJCC 8th Edition - Clinical stage from 12/29/2016: Stage IV (cT1, cNX, pM1) - Signed by Earlie Server, MD on 12/29/2016  1. Cholangiocarcinoma ([place])   2. Anemia due to antineoplastic chemotherapy   3. Goals of care, counseling/discussion   4. Ototoxicity, unspecified laterality   5. Thrombocytopenia ([place])    # I explained to the patient the risks and benefits of continuing chemotherapy with gemcitabine +/- cisplatin including all but not limited to hearing loss, mouth sore, nausea, vomiting, low blood counts, bleeding, and risk of life threatening infection and even death, secondary malignancy etc. Baseline audio testing is preferred, but patient has already received cisplatin at [facility] and she agrees with proceeding cisplatin today without additional audio test in order to avoid further delay.  Patient is willing to continue on chemotherapy and  prefers to have her chemotherapy at [facility]. She declined chemotherapy class.  # goal of care is palliative intent, discussed with patient. Encourage patient to inform her family members about her end of life wishes.  #Ok to proceed Cycle 4  Day 9 Gemcitabine and Cisplatin 30m/m2 (20% dose reduction) (given on Day 9 because patient did not show up yesterday). neulasta for bone marrow support.    # Antiemetics-Zofran and Compazine; EMLA cream sent to pharmacy # Pain is controlled. Patient reports having enough medication at home. # Skin rash/lupus, follows up with Rhematology. She is on Plaquenil and Dexamethasone 482mdaily [facility] her Dexamethasone 77m87mer week, monitoring rash.   All questions were answered. The patient knows to call the clinic with any problems questions or concerns.  Return of visit: 1 week to assess chemotherapy toxicity.   ZhoEarlie ServerD, PhD Hematology Oncology [facility]

## 2017-01-05 NOTE — Progress Notes (Signed)
Patient here today for follow up.  Patient states no new concerns today  

## 2017-01-06 ENCOUNTER — Inpatient Hospital Stay: Payer: PPO

## 2017-01-06 DIAGNOSIS — C221 Intrahepatic bile duct carcinoma: Secondary | ICD-10-CM

## 2017-01-06 DIAGNOSIS — Z5111 Encounter for antineoplastic chemotherapy: Secondary | ICD-10-CM | POA: Diagnosis not present

## 2017-01-06 MED ORDER — PEGFILGRASTIM INJECTION 6 MG/0.6ML ~~LOC~~
6.0000 mg | PREFILLED_SYRINGE | Freq: Once | SUBCUTANEOUS | Status: AC
  Administered 2017-01-06: 6 mg via SUBCUTANEOUS
  Filled 2017-01-06: qty 0.6

## 2017-01-12 ENCOUNTER — Other Ambulatory Visit: Payer: Self-pay

## 2017-01-12 ENCOUNTER — Inpatient Hospital Stay: Payer: PPO | Admitting: *Deleted

## 2017-01-12 ENCOUNTER — Ambulatory Visit: Payer: Self-pay | Admitting: Oncology

## 2017-01-12 ENCOUNTER — Encounter: Payer: Self-pay | Admitting: Oncology

## 2017-01-12 ENCOUNTER — Inpatient Hospital Stay (HOSPITAL_BASED_OUTPATIENT_CLINIC_OR_DEPARTMENT_OTHER): Payer: PPO | Admitting: Oncology

## 2017-01-12 VITALS — BP 122/78 | HR 114 | Temp 97.0°F | Ht 62.25 in | Wt 151.6 lb

## 2017-01-12 DIAGNOSIS — Z7189 Other specified counseling: Secondary | ICD-10-CM

## 2017-01-12 DIAGNOSIS — J449 Chronic obstructive pulmonary disease, unspecified: Secondary | ICD-10-CM | POA: Diagnosis not present

## 2017-01-12 DIAGNOSIS — Z862 Personal history of diseases of the blood and blood-forming organs and certain disorders involving the immune mechanism: Secondary | ICD-10-CM

## 2017-01-12 DIAGNOSIS — D509 Iron deficiency anemia, unspecified: Secondary | ICD-10-CM | POA: Diagnosis not present

## 2017-01-12 DIAGNOSIS — D6481 Anemia due to antineoplastic chemotherapy: Secondary | ICD-10-CM

## 2017-01-12 DIAGNOSIS — H538 Other visual disturbances: Secondary | ICD-10-CM

## 2017-01-12 DIAGNOSIS — H938X9 Other specified disorders of ear, unspecified ear: Secondary | ICD-10-CM

## 2017-01-12 DIAGNOSIS — M5136 Other intervertebral disc degeneration, lumbar region: Secondary | ICD-10-CM | POA: Diagnosis not present

## 2017-01-12 DIAGNOSIS — K5190 Ulcerative colitis, unspecified, without complications: Secondary | ICD-10-CM

## 2017-01-12 DIAGNOSIS — K219 Gastro-esophageal reflux disease without esophagitis: Secondary | ICD-10-CM

## 2017-01-12 DIAGNOSIS — R21 Rash and other nonspecific skin eruption: Secondary | ICD-10-CM | POA: Diagnosis not present

## 2017-01-12 DIAGNOSIS — Z7984 Long term (current) use of oral hypoglycemic drugs: Secondary | ICD-10-CM

## 2017-01-12 DIAGNOSIS — C221 Intrahepatic bile duct carcinoma: Secondary | ICD-10-CM

## 2017-01-12 DIAGNOSIS — I10 Essential (primary) hypertension: Secondary | ICD-10-CM

## 2017-01-12 DIAGNOSIS — M629 Disorder of muscle, unspecified: Secondary | ICD-10-CM

## 2017-01-12 DIAGNOSIS — H9109 Ototoxic hearing loss, unspecified ear: Secondary | ICD-10-CM | POA: Diagnosis not present

## 2017-01-12 DIAGNOSIS — Z5111 Encounter for antineoplastic chemotherapy: Secondary | ICD-10-CM | POA: Diagnosis not present

## 2017-01-12 DIAGNOSIS — E042 Nontoxic multinodular goiter: Secondary | ICD-10-CM | POA: Diagnosis not present

## 2017-01-12 DIAGNOSIS — Z7689 Persons encountering health services in other specified circumstances: Secondary | ICD-10-CM | POA: Diagnosis not present

## 2017-01-12 DIAGNOSIS — T451X5A Adverse effect of antineoplastic and immunosuppressive drugs, initial encounter: Secondary | ICD-10-CM

## 2017-01-12 DIAGNOSIS — E119 Type 2 diabetes mellitus without complications: Secondary | ICD-10-CM

## 2017-01-12 DIAGNOSIS — M329 Systemic lupus erythematosus, unspecified: Secondary | ICD-10-CM

## 2017-01-12 DIAGNOSIS — D696 Thrombocytopenia, unspecified: Secondary | ICD-10-CM

## 2017-01-12 DIAGNOSIS — Z87891 Personal history of nicotine dependence: Secondary | ICD-10-CM

## 2017-01-12 DIAGNOSIS — F329 Major depressive disorder, single episode, unspecified: Secondary | ICD-10-CM

## 2017-01-12 DIAGNOSIS — Z79899 Other long term (current) drug therapy: Secondary | ICD-10-CM

## 2017-01-12 LAB — CBC WITH DIFFERENTIAL/PLATELET
Basophils Absolute: 0.3 10*3/uL — ABNORMAL HIGH (ref 0–0.1)
Basophils Relative: 1 %
EOS PCT: 0 %
Eosinophils Absolute: 0 10*3/uL (ref 0–0.7)
HEMATOCRIT: 24.3 % — AB (ref 35.0–47.0)
Hemoglobin: 8 g/dL — ABNORMAL LOW (ref 12.0–16.0)
LYMPHS ABS: 1.6 10*3/uL (ref 1.0–3.6)
Lymphocytes Relative: 5 %
MCH: 29.2 pg (ref 26.0–34.0)
MCHC: 33 g/dL (ref 32.0–36.0)
MCV: 88.7 fL (ref 80.0–100.0)
MONOS PCT: 6 %
Monocytes Absolute: 1.9 10*3/uL — ABNORMAL HIGH (ref 0.2–0.9)
NEUTROS ABS: 28.2 10*3/uL — AB (ref 1.4–6.5)
Neutrophils Relative %: 88 %
Platelets: 22 10*3/uL — CL (ref 150–400)
RBC: 2.74 MIL/uL — ABNORMAL LOW (ref 3.80–5.20)
RDW: 23.8 % — AB (ref 11.5–14.5)
WBC: 32 10*3/uL — ABNORMAL HIGH (ref 3.6–11.0)

## 2017-01-12 LAB — COMPREHENSIVE METABOLIC PANEL
ALT: 72 U/L — ABNORMAL HIGH (ref 14–54)
ANION GAP: 11 (ref 5–15)
AST: 68 U/L — ABNORMAL HIGH (ref 15–41)
Albumin: 3.1 g/dL — ABNORMAL LOW (ref 3.5–5.0)
Alkaline Phosphatase: 492 U/L — ABNORMAL HIGH (ref 38–126)
BILIRUBIN TOTAL: 0.6 mg/dL (ref 0.3–1.2)
BUN: 21 mg/dL — ABNORMAL HIGH (ref 6–20)
CO2: 26 mmol/L (ref 22–32)
Calcium: 8.7 mg/dL — ABNORMAL LOW (ref 8.9–10.3)
Chloride: 99 mmol/L — ABNORMAL LOW (ref 101–111)
Creatinine, Ser: 0.97 mg/dL (ref 0.44–1.00)
GFR calc Af Amer: >60 mL/min (ref >60)
GFR, EST NON AFRICAN AMERICAN: 56 mL/min — AB (ref >60)
Glucose, Bld: 131 mg/dL — ABNORMAL HIGH (ref 65–99)
POTASSIUM: 3.5 mmol/L (ref 3.5–5.1)
Sodium: 136 mmol/L (ref 135–145)
TOTAL PROTEIN: 6.2 g/dL — AB (ref 6.5–8.1)

## 2017-01-12 LAB — SAMPLE TO BLOOD BANK

## 2017-01-12 MED ORDER — OXYCODONE HCL 5 MG PO TABS: 5.0000 mg | ORAL_TABLET | Freq: Four times a day (QID) | ORAL | 0 refills | Status: DC | PRN

## 2017-01-12 NOTE — Progress Notes (Signed)
Patient here for follow up. She has complaints of blurred vision for the past 3 weeks, she is feeling extremely tired.

## 2017-01-12 NOTE — Progress Notes (Signed)
Hematology/Oncology Follow up note [facility] Telephone:([phone]) [phone] [phone]) [phone]  CONSULT NOTE Patient Care Team: Idelle Crouch, MD as PCP - General (Internal Medicine)  CHIEF COMPLAINTS/PURPOSE OF CONSULTATION:  I have cancer and want to transfer my care to [facility].    HISTORY OF PRESENTING ILLNESS:  Regina Hood 76 y.o.  female with PMH listed as below, most significantly for cholangiocarcinoma who has been on chemotherapy treatment presents to transfer her cancer care to our cancer center.  Pertinent Cancer History.  1. Initially presented in 07/2016 to an outside ED with lower and upper GI bleed.  A. She was found to have new LFTs elevations including AST 340, ALT 379, Alk phos 420 and t bili 0.9.   B. [place] was obtained, which did not visualize the left lobe.  C. She was evaluated by gastroenterology because of her history of UC and underwent colonoscopy, which was unremarkable per path report.  2. 08/20/16, CT noted diffuse atrophy of the left lobe with left intrahepatic biliary ductal dilatation, with 4.1 cm ill-defined low-attenuation mass. This mass was suspicious for malignancy.  3. 08/2016, MRI/MRCP which showed abnormal left hepatic lobe with diffuse biliary dilatation with a shrunken left hepatic lobe and a 2.42.9 cm oval-shaped masslike component displacing the adjacent bile ducts. There is an approximately 1.6 cm segment truncated bile ducts involving common hepatic duct and right and left hepatic although no dilatation of the right hepatic duct system was seen.   4. 09/02/16, ERCP showed localized biliary stricture in the left intrahepatic duct with dilation of the left intrahepatic branches. Sphincterotomy was performed and a stent was placed. EUS with a 3.3 cm mass and left duct dilation.  5. Repeat ERCP was done 09/10/16, during which the stent was exchanged.  6. 09/20/16, taken to OR and noted to a have an peritoneal implant with biopsy c/w  adenocarcinoma. The neoplastic cells are positive for CK7. They are negative for CK20, CDX-2, TTF-1, Napsin A, hep-par1, glypican 3, and arginase. The immunohistochemical features are consistent with adenocarcinoma of upper gastrointestinal tract or pancreaticobiliary origin.  7. 09/28/16, initially seen in medical oncology clinic by Dr. Filomena Jungling 8. 09/29/16, admitted for biliary obstruction and cholangitis, s/p PBD placement, d/c 10/06/16 9. 10/12/16, initiated on gemcitabine (400 mg/m2). CA 19-9 167 10. 10/26/16, increased gemcitabine (800 mg/m2) 11. 11/23/2016 Cycle 3 Gemzar (848m/m2), 12/07/2016, Gemzar (8053mm2) added cisplatin 2549m2  12. 12/14/2016, CT CAP shows slight interval decrease in size of L hepatic lobe cholangio. Slight interval increase in size of porta hepatis lymph node whiel other nodes are slightly smaller. Multiple bilateral thyroid nodules. Stable mediastinal lymph nodes. CA 19-9 60 14  8/30 Cycle 4 Gemzar (800m6m) added cisplatin 20mg22m 13, 12/28/2016 Gemcitabine 800mg/58mcisplatin was hold due to anemia. S/p 1 PRBC transfusion.          She forgot her appointment on day 8 and Gemcitabine 800mg/m75md cisplatin 20mg/m282m given  Day 9 of cycle 4.   14 She has been on Dexamethasone 4mg dail48mor skin rash since this summer by [facility], on Dexamethasone 3mg since22m21/2018.   INTERVAL HISTORY   Patient reports feeling fatigued after chemotherapy. Denies any bleeding events. Her appetite is good. Her vision is blurry and she also broke her eye classes.   ROS:  Review of Systems  Constitutional: Positive for fatigue.  HENT:         Blurry vision.   Eyes: Negative.   Respiratory: Negative.  Cardiovascular: Negative.   Gastrointestinal:        bilateral PBDs exchanged recently, capped for internal draining.   Endocrine: Negative.   Genitourinary: Negative.    Musculoskeletal: Negative.   Skin:       Chronic rash on steroids.     Neurological: Negative.   Hematological: Negative.   Psychiatric/Behavioral: Negative.     MEDICAL HISTORY:  Past Medical History:  Diagnosis Date  . Arthritis   . Cancer ([place])   . Cellulitis   . COPD (chronic obstructive pulmonary disease) ([place])   . DDD (degenerative disc disease), lumbar   . Depression   . Diabetes mellitus   . Diverticulitis   . GERD (gastroesophageal reflux disease)   . GI bleed   . Headache(784.0)   . Hypertension   . Lupus   . Ulcerative colitis ([place])     SURGICAL HISTORY: Past Surgical History:  Procedure Laterality Date  . ABDOMINAL HYSTERECTOMY    . BREAST BIOPSY Right   . COLONOSCOPY    . COLONOSCOPY WITH PROPOFOL N/A 08/18/2016   Procedure: COLONOSCOPY WITH PROPOFOL;  Surgeon: Manya Silvas, MD;  Location: [facility] ENDOSCOPY;  Service: Endoscopy;  Laterality: N/A;  . ESOPHAGOGASTRODUODENOSCOPY (EGD) WITH PROPOFOL N/A 08/18/2016   Procedure: ESOPHAGOGASTRODUODENOSCOPY (EGD) WITH PROPOFOL;  Surgeon: Manya Silvas, MD;  Location: [facility] ENDOSCOPY;  Service: Endoscopy;  Laterality: N/A;  . ORIF HUMERUS FRACTURE  09/17/2011   Procedure: OPEN REDUCTION INTERNAL FIXATION (ORIF) PROXIMAL HUMERUS FRACTURE;  Surgeon: Augustin Schooling, MD;  Location: [place];  Service: Orthopedics;  Laterality: Left;    SOCIAL HISTORY: Social History   Social History  . Marital status: Widowed    Spouse name: N/A  . Number of children: N/A  . Years of education: N/A   Occupational History  . Not on file.   Social History Main Topics  . Smoking status: Former Smoker    Packs/day: 1.00    Years: 10.00    Types: Cigarettes    Quit date: 12/30/1998  . Smokeless tobacco: Never Used     Comment: quit smoking in 2003  . Alcohol use No  . Drug use: No  . Sexual activity: Not on file   Other Topics Concern  . Not on file   Social History Narrative  . No narrative on file    FAMILY HISTORY: Family History  Problem Relation Age of Onset  . Breast cancer Mother 95   . COPD Father   . Prostate cancer Brother     ALLERGIES:  is allergic to feldene [piroxicam]; penicillamine; and qualaquin [quinine].  MEDICATIONS:  Current Outpatient Prescriptions  Medication Sig Dispense Refill  . acetaminophen (TYLENOL) 500 MG tablet Take 1,000 mg by mouth 3 (three) times daily.    Marland Kitchen amLODipine (NORVASC) 5 MG tablet Take 5 mg by mouth daily.    Marland Kitchen azelastine (ASTELIN) 0.1 % nasal spray Place into the nose.    . calcium carbonate 100 mg/ml SUSP Take by mouth.    . clobetasol ointment (TEMOVATE) 0.05 % Apply topically.    . [employer] STOOL SOFTENER 8.6-50 MG tablet Take 1 tablet by mouth daily. 90 tablet 3  . Cyanocobalamin (VITAMIN B-12 PO) Take 1,000 mcg by mouth daily.    Marland Kitchen dexamethasone (DECADRON) 1 MG tablet Take 1 tablet (1 mg total) by mouth See admin instructions. Take 70m daily for 1 week.  Then take 222mdaily for 1 week.  Then take 28m36maily for 1 week.  Then take 28mg15mery  other day for one week.  Then stop. 43 tablet 0  . estradiol (ESTRACE) 1 MG tablet Take 1 mg by mouth daily.    Marland Kitchen gabapentin (NEURONTIN) 100 MG capsule Take 100 mg by mouth 3 (three) times daily.     . hydroxychloroquine (PLAQUENIL) 200 MG tablet Take by mouth.    Marland Kitchen KLOR-CON 10 10 MEQ tablet Take 2 tablets by mouth daily.  0  . LORazepam (ATIVAN) 0.5 MG tablet Take 1 tablet (0.5 mg total) by mouth every 6 (six) hours as needed. 30 tablet 0  . metFORMIN (GLUCOPHAGE) 500 MG tablet Take 500 mg by mouth 2 (two) times daily with a meal.    . ondansetron (ZOFRAN) 8 MG tablet Take 1 tablet (8 mg total) by mouth every 12 (twelve) hours as needed. 30 tablet 2  . oxyCODONE (OXY IR/ROXICODONE) 5 MG immediate release tablet Take 5 mg by mouth every 4 (four) hours as needed for pain.  0  . pantoprazole (PROTONIX) 40 MG tablet Take 40 mg by mouth daily.    . polyethylene glycol powder (GLYCOLAX/MIRALAX) powder     . prochlorperazine (COMPAZINE) 10 MG tablet Take 10 mg by mouth every 6 (six) hours as  needed.    . Sodium Chloride Flush (NORMAL SALINE FLUSH) 0.9 % SOLN 1 mL by Other route 2 (two) times daily. Flush each biliary drain with 1 mL twice daily  0  . traMADol (ULTRAM) 50 MG tablet Take 50 mg by mouth 2 (two) times daily.     Marland Kitchen venlafaxine XR (EFFEXOR-XR) 75 MG 24 hr capsule Take 225 mg by mouth daily.     Marland Kitchen zolpidem (AMBIEN) 10 MG tablet Take 1 tablet (10 mg total) by mouth at bedtime as needed. 30 tablet 3   No current facility-administered medications for this visit.       Marland Kitchen  PHYSICAL EXAMINATION: ECOG PERFORMANCE STATUS: 1 - Symptomatic but completely ambulatory Vitals:   01/12/17 1126  BP: 122/78  Pulse: (!) 114  Temp: (!) 97 F (36.1 C)  SpO2: 91%   Filed Weights   01/12/17 1126  Weight: 151 lb 9.6 oz (68.8 kg)    GENERAL:Alert, no distress and comfortable.  EYES: no pallor or icterus OROPHARYNX: no thrush or ulceration; good dentition  NECK: supple, no masses felt LYMPH:  no palpable lymphadenopathy in the cervical, axillary or inguinal regions LUNGS: clear to auscultation and  No wheeze or crackles HEART/CVS: regular rate & rhythm and no murmurs; No lower extremity edema ABDOMEN: abdomen soft, non-tender and normal bowel sounds. 2 PBD tubes with sutures. Dressing was dry.  Musculoskeletal:no cyanosis of digits and no clubbing  PSYCH: alert & oriented x 3  NEURO: no focal motor/sensory deficits SKIN:  Rashes on her anterior chest wall.   LABORATORY DATA:  I have reviewed the data as listed Lab Results  Component Value Date   WBC 32.0 (H) 01/12/2017   HGB 8.0 (L) 01/12/2017   HCT 24.3 (L) 01/12/2017   MCV 88.7 01/12/2017   PLT 22 (LL) 01/12/2017    Recent Labs  11/13/16 0019 12/29/16 1605 01/05/17 0824  NA 133* 134* 137  K 3.5 4.8 3.4*  CL 100* 97* 100*  CO2 _0 GLUCOSE 132* 255* 188*  BUN 11 19 22*  CREATININE 0.64 0.73 0.69  CALCIUM 7.9* 9.2 8.9  GFRNONAA >60 >60 >60  GFRAA >60 >60 >60  PROT 7.2 7.0 6.5  ALBUMIN 3.1* 3.2*  3.1*  AST 53* 112* 91*  ALT 41 107* 84*  ALKPHOS 265* 365* 374*  BILITOT 0.8 0.8 0.6   [facility] CA 19-9   09/14/16 220 --> 09/28/16 176--> 10/12/16 167-->12/07/2016 64-->12/14/2016 60. --> 12/29/2016 87  RADIOGRAPHIC STUDIES: I have personally reviewed the radiological images as listed and agreed with the findings in the report. CT 12/14/2016 [facility] Impression: 1. Slight interval decrease in size of the left hepatic lobe cholangiocarcinoma. 2. Slight interval increase in size of a porta hepatis lymph node. Other nodes are slightly smaller. 3. Multiple bilateral thyroid nodules. Recommend ultrasound for further evaluation. 4. Stable mediastinal lymph nodes.  ASSESSMENT & PLAN:  Cancer Staging Cholangiocarcinoma [facility]) Staging form: Intrahepatic Bile Duct, AJCC 8th Edition - Clinical stage from 12/29/2016: Stage IV (cT1, cNX, pM1) - Signed by Earlie Server, MD on 12/29/2016  1. Cholangiocarcinoma ([place])   2. Anemia due to antineoplastic chemotherapy   3. Goals of care, counseling/discussion   4. Thrombocytopenia ([place])   5. Systemic lupus erythematosus, unspecified SLE type, unspecified organ involvement status ([place])   6. History of iron deficiency anemia   7. Ototoxicity, unspecified laterality    # s/p  Cycle 4  Day 9 Gemcitabine and Cisplatin 70m/m2 (20% dose reduction) (given on Day 9 because patient did not show up yesterday). neulasta for bone marrow support. ANC is 28.2 # Grade 4 Thrombocytopenia: no active bleeding. No need for transfusion today. Recheck CBC on Day 21/Day 1 of next cycle.   # Antiemetics-Zofran and Compazine; EMLA cream sent to pharmacy # Pain is controlled. Patient reports having enough medication at home. Refilled her oxycodone 175mQ6h PRN as needed.  # Skin rash/lupus, follows up with Rhematology. She is on Plaquenil and Dex[facility] 73m11maily [facility]  # Fatigue: could be a combination of  chemotherapy and tapering of dexamethasone. I ask her to stay on 3mg 39mly until I tell her to further taper it down.  # Blurry vision: preexisting problem prior to chemotherapy and per patient she has an appointment with Ophthalmologist at the beginning of Oct. Advise patient to stop driving before her visions are taken care of. She voices understanding. Offered to look for resources for transportation assistance and she says she will ask her son to provide transportation.   # discussed about goal of care. Patient understands that her condition is curable, and treatment is palliative. She agrees to be referred to see palliative medicine.   All questions were answered. The patient knows to call the clinic with any problems questions or concerns.  Return of visit: 1 week for cycle 5 Gemcitabine and Cisplatin / onpro.   Omari Mcmanaway Earlie Server PhD Hematology Oncology [facility]- [id number]

## 2017-01-14 DIAGNOSIS — H40003 Preglaucoma, unspecified, bilateral: Secondary | ICD-10-CM | POA: Diagnosis not present

## 2017-01-14 DIAGNOSIS — Z79899 Other long term (current) drug therapy: Secondary | ICD-10-CM | POA: Diagnosis not present

## 2017-01-14 DIAGNOSIS — M329 Systemic lupus erythematosus, unspecified: Secondary | ICD-10-CM | POA: Diagnosis not present

## 2017-01-19 ENCOUNTER — Other Ambulatory Visit: Payer: Self-pay | Admitting: Oncology

## 2017-01-19 ENCOUNTER — Inpatient Hospital Stay: Payer: PPO | Attending: Oncology

## 2017-01-19 ENCOUNTER — Inpatient Hospital Stay (HOSPITAL_BASED_OUTPATIENT_CLINIC_OR_DEPARTMENT_OTHER): Payer: PPO | Admitting: Oncology

## 2017-01-19 ENCOUNTER — Encounter: Payer: Self-pay | Admitting: Oncology

## 2017-01-19 ENCOUNTER — Inpatient Hospital Stay: Payer: PPO

## 2017-01-19 VITALS — BP 145/78 | HR 109 | Temp 96.8°F | Wt 151.0 lb

## 2017-01-19 DIAGNOSIS — R530 Neoplastic (malignant) related fatigue: Secondary | ICD-10-CM | POA: Insufficient documentation

## 2017-01-19 DIAGNOSIS — Z8619 Personal history of other infectious and parasitic diseases: Secondary | ICD-10-CM

## 2017-01-19 DIAGNOSIS — T402X5S Adverse effect of other opioids, sequela: Secondary | ICD-10-CM | POA: Diagnosis not present

## 2017-01-19 DIAGNOSIS — C221 Intrahepatic bile duct carcinoma: Secondary | ICD-10-CM

## 2017-01-19 DIAGNOSIS — E042 Nontoxic multinodular goiter: Secondary | ICD-10-CM | POA: Insufficient documentation

## 2017-01-19 DIAGNOSIS — Z79899 Other long term (current) drug therapy: Secondary | ICD-10-CM

## 2017-01-19 DIAGNOSIS — F329 Major depressive disorder, single episode, unspecified: Secondary | ICD-10-CM | POA: Insufficient documentation

## 2017-01-19 DIAGNOSIS — E1165 Type 2 diabetes mellitus with hyperglycemia: Secondary | ICD-10-CM | POA: Diagnosis not present

## 2017-01-19 DIAGNOSIS — M5136 Other intervertebral disc degeneration, lumbar region: Secondary | ICD-10-CM | POA: Insufficient documentation

## 2017-01-19 DIAGNOSIS — E119 Type 2 diabetes mellitus without complications: Secondary | ICD-10-CM | POA: Insufficient documentation

## 2017-01-19 DIAGNOSIS — I10 Essential (primary) hypertension: Secondary | ICD-10-CM | POA: Diagnosis not present

## 2017-01-19 DIAGNOSIS — R21 Rash and other nonspecific skin eruption: Secondary | ICD-10-CM | POA: Diagnosis not present

## 2017-01-19 DIAGNOSIS — R5383 Other fatigue: Secondary | ICD-10-CM | POA: Diagnosis not present

## 2017-01-19 DIAGNOSIS — Z5111 Encounter for antineoplastic chemotherapy: Secondary | ICD-10-CM | POA: Diagnosis not present

## 2017-01-19 DIAGNOSIS — G893 Neoplasm related pain (acute) (chronic): Secondary | ICD-10-CM

## 2017-01-19 DIAGNOSIS — Z87891 Personal history of nicotine dependence: Secondary | ICD-10-CM | POA: Diagnosis not present

## 2017-01-19 DIAGNOSIS — T402X5A Adverse effect of other opioids, initial encounter: Secondary | ICD-10-CM

## 2017-01-19 DIAGNOSIS — K219 Gastro-esophageal reflux disease without esophagitis: Secondary | ICD-10-CM | POA: Insufficient documentation

## 2017-01-19 DIAGNOSIS — D6959 Other secondary thrombocytopenia: Secondary | ICD-10-CM | POA: Diagnosis not present

## 2017-01-19 DIAGNOSIS — G629 Polyneuropathy, unspecified: Secondary | ICD-10-CM | POA: Diagnosis not present

## 2017-01-19 DIAGNOSIS — Z8042 Family history of malignant neoplasm of prostate: Secondary | ICD-10-CM | POA: Insufficient documentation

## 2017-01-19 DIAGNOSIS — J449 Chronic obstructive pulmonary disease, unspecified: Secondary | ICD-10-CM | POA: Insufficient documentation

## 2017-01-19 DIAGNOSIS — Z8719 Personal history of other diseases of the digestive system: Secondary | ICD-10-CM | POA: Diagnosis not present

## 2017-01-19 DIAGNOSIS — M329 Systemic lupus erythematosus, unspecified: Secondary | ICD-10-CM | POA: Diagnosis not present

## 2017-01-19 DIAGNOSIS — D649 Anemia, unspecified: Secondary | ICD-10-CM

## 2017-01-19 DIAGNOSIS — E860 Dehydration: Secondary | ICD-10-CM

## 2017-01-19 DIAGNOSIS — Z803 Family history of malignant neoplasm of breast: Secondary | ICD-10-CM

## 2017-01-19 DIAGNOSIS — Z7984 Long term (current) use of oral hypoglycemic drugs: Secondary | ICD-10-CM

## 2017-01-19 DIAGNOSIS — D6481 Anemia due to antineoplastic chemotherapy: Secondary | ICD-10-CM

## 2017-01-19 DIAGNOSIS — K5903 Drug induced constipation: Secondary | ICD-10-CM | POA: Insufficient documentation

## 2017-01-19 DIAGNOSIS — T451X5A Adverse effect of antineoplastic and immunosuppressive drugs, initial encounter: Secondary | ICD-10-CM

## 2017-01-19 LAB — COMPREHENSIVE METABOLIC PANEL
ALBUMIN: 3 g/dL — AB (ref 3.5–5.0)
ALK PHOS: 477 U/L — AB (ref 38–126)
ALT: 75 U/L — ABNORMAL HIGH (ref 14–54)
AST: 95 U/L — AB (ref 15–41)
Anion gap: 8 (ref 5–15)
BILIRUBIN TOTAL: 0.7 mg/dL (ref 0.3–1.2)
BUN: 20 mg/dL (ref 6–20)
CALCIUM: 8.7 mg/dL — AB (ref 8.9–10.3)
CO2: 28 mmol/L (ref 22–32)
CREATININE: 1.05 mg/dL — AB (ref 0.44–1.00)
Chloride: 98 mmol/L — ABNORMAL LOW (ref 101–111)
GFR calc Af Amer: 59 mL/min — ABNORMAL LOW (ref >60)
GFR calc non Af Amer: 51 mL/min — ABNORMAL LOW (ref >60)
GLUCOSE: 197 mg/dL — AB (ref 65–99)
Potassium: 4.1 mmol/L (ref 3.5–5.1)
Sodium: 134 mmol/L — ABNORMAL LOW (ref 135–145)
TOTAL PROTEIN: 6.5 g/dL (ref 6.5–8.1)

## 2017-01-19 LAB — CBC WITH DIFFERENTIAL/PLATELET
BASOS PCT: 0 %
Basophils Absolute: 0 10*3/uL (ref 0–0.1)
EOS PCT: 1 %
Eosinophils Absolute: 0.2 10*3/uL (ref 0–0.7)
HEMATOCRIT: 23.5 % — AB (ref 35.0–47.0)
HEMOGLOBIN: 7.7 g/dL — AB (ref 12.0–16.0)
LYMPHS ABS: 1.3 10*3/uL (ref 1.0–3.6)
LYMPHS PCT: 6 %
MCH: 30.6 pg (ref 26.0–34.0)
MCHC: 32.8 g/dL (ref 32.0–36.0)
MCV: 93.3 fL (ref 80.0–100.0)
MONOS PCT: 12 %
Monocytes Absolute: 2.7 10*3/uL — ABNORMAL HIGH (ref 0.2–0.9)
NEUTROS PCT: 81 %
Neutro Abs: 18.2 10*3/uL — ABNORMAL HIGH (ref 1.4–6.5)
Platelets: 302 10*3/uL (ref 150–440)
RBC: 2.52 MIL/uL — AB (ref 3.80–5.20)
RDW: 26.3 % — ABNORMAL HIGH (ref 11.5–14.5)
Smear Review: ADEQUATE
WBC: 22.4 10*3/uL — ABNORMAL HIGH (ref 3.6–11.0)

## 2017-01-19 LAB — PREPARE RBC (CROSSMATCH)

## 2017-01-19 MED ORDER — DEXTROSE-NACL 5-0.45 % IV SOLN
Freq: Once | INTRAVENOUS | Status: AC
  Administered 2017-01-19: 10:00:00 via INTRAVENOUS
  Filled 2017-01-19: qty 1000

## 2017-01-19 MED ORDER — DEXAMETHASONE 1 MG PO TABS: 3.0000 mg | ORAL_TABLET | Freq: Every day | ORAL | 0 refills | Status: DC

## 2017-01-19 MED ORDER — SODIUM CHLORIDE 0.9 % IV SOLN
20.0000 mg/m2 | Freq: Once | INTRAVENOUS | Status: AC
  Administered 2017-01-19: 34 mg via INTRAVENOUS
  Filled 2017-01-19: qty 34

## 2017-01-19 MED ORDER — PALONOSETRON HCL INJECTION 0.25 MG/5ML
0.2500 mg | Freq: Once | INTRAVENOUS | Status: AC
  Administered 2017-01-19: 0.25 mg via INTRAVENOUS
  Filled 2017-01-19: qty 5

## 2017-01-19 MED ORDER — SODIUM CHLORIDE 0.9 % IV SOLN
1400.0000 mg | Freq: Once | INTRAVENOUS | Status: AC
  Administered 2017-01-19: 1400 mg via INTRAVENOUS
  Filled 2017-01-19: qty 26.3

## 2017-01-19 MED ORDER — SENNA 8.6 MG PO TABS: 2.0000 | ORAL_TABLET | Freq: Every day | ORAL | 0 refills | Status: DC

## 2017-01-19 MED ORDER — HEPARIN SOD (PORK) LOCK FLUSH 100 UNIT/ML IV SOLN
500.0000 [IU] | Freq: Once | INTRAVENOUS | Status: AC | PRN
  Administered 2017-01-19: 500 [IU]
  Filled 2017-01-19: qty 5

## 2017-01-19 MED ORDER — FOSAPREPITANT DIMEGLUMINE INJECTION 150 MG
Freq: Once | INTRAVENOUS | Status: AC
  Administered 2017-01-19: 12:00:00 via INTRAVENOUS
  Filled 2017-01-19: qty 5

## 2017-01-19 MED ORDER — SODIUM CHLORIDE 0.9 % IV SOLN
Freq: Once | INTRAVENOUS | Status: AC
  Administered 2017-01-19: 10:00:00 via INTRAVENOUS
  Filled 2017-01-19: qty 1000

## 2017-01-19 NOTE — Progress Notes (Signed)
Hematology/Oncology Follow up note [facility] Telephone:([phone]) [phone] Fax:(336) [phone]  Patient Care Team: Idelle Crouch, MD as PCP - General (Internal Medicine) REASON FOR VISIT Follow up for treatment of cholangiocarcinoma  PERTINENT ONCOLOGY HISTORY Regina Hood 76 y.o.  female with PMH listed as below, most significantly for cholangiocarcinoma who has been on chemotherapy treatment presents to transfer her cancer care to our cancer center.  After extensive medical record review, summary of her diagnosis and treatment history are listed below.  1. Initially presented in 07/2016 to an outside ED with lower and upper GI bleed.  A. She was found to have new LFTs elevations including AST 340, ALT 379, Alk phos 420 and t bili 0.9.   B. [place] was obtained, which did not visualize the left lobe.  C. She was evaluated by gastroenterology because of her history of UC and underwent colonoscopy, which was unremarkable per path report.  2. 08/20/16, CT noted diffuse atrophy of the left lobe with left intrahepatic biliary ductal dilatation, with 4.1 cm ill-defined low-attenuation mass. This mass was suspicious for malignancy.  3. 08/2016, MRI/MRCP which showed abnormal left hepatic lobe with diffuse biliary dilatation with a shrunken left hepatic lobe and a 2.42.9 cm oval-shaped masslike component displacing the adjacent bile ducts. There is an approximately 1.6 cm segment truncated bile ducts involving common hepatic duct and right and left hepatic although no dilatation of the right hepatic duct system was seen.   4. 09/02/16, ERCP showed localized biliary stricture in the left intrahepatic duct with dilation of the left intrahepatic branches. Sphincterotomy was performed and a stent was placed. EUS with a 3.3 cm mass and left duct dilation.  5. Repeat ERCP was done 09/10/16, during which the stent was exchanged.  6. 09/20/16, taken to OR and noted to a have an peritoneal  implant with biopsy c/w adenocarcinoma. The neoplastic cells are positive for CK7. They are negative for CK20, CDX-2, TTF-1, Napsin A, hep-par1, glypican 3, and arginase. The immunohistochemical features are consistent with adenocarcinoma of upper gastrointestinal tract or pancreaticobiliary origin.  7. 09/28/16, initially seen in medical oncology clinic by Dr. Filomena Jungling 8. 09/29/16, admitted for biliary obstruction and cholangitis, s/p PBD placement, d/c 10/06/16 9. 10/12/16, initiated on gemcitabine (400 mg/m2). CA 19-9 167 10. 10/26/16, increased gemcitabine (800 mg/m2) 11. 11/23/2016 Cycle 3 Gemzar (813m/m2), 12/07/2016, Gemzar (8050mm2) added cisplatin 2532m2  12. 12/14/2016, CT CAP shows slight interval decrease in size of L hepatic lobe cholangio. Slight interval increase in size of porta hepatis lymph node whiel other nodes are slightly smaller. Multiple bilateral thyroid nodules. Stable mediastinal lymph nodes. CA 19-9 60 14  8/30 Cycle 4 Gemzar (800m31m) added cisplatin 20mg92m 15, 12/28/2016 Gemcitabine 800mg/82mcisplatin was hold due to anemia. S/p 1 PRBC transfusion.          She forgot her appointment on day 8 and Gemcitabine 800mg/m64md cisplatin 20mg/m274m given  Day 9 of cycle 4.   16 She has been on Dexamethasone 4mg [id number] skin rash since this summer by [facility] tapering course, on Dexamethasone 3mg since23m21/2018.  INTERVAL HISTORY Regina L SmitSKYLEEN BENTLEYf62ale with above history who presents for evaluation prior to cycle 5 chemotherapy Gemcitabine and Cisplatin treatment.  Problems and complaints are listed below: Chemotherapy induced nausea: minimal and stable. Controlled with PRN antiemetics. Neoplasm related fatigue: severe, worsen recently. She reports she moves slowly, but still able to carry her ADLs and she does not  want her children to come over and help her on that.  Neoplasm related pain scale: stable, mostly ankle and feet. Some fullness of abdomen. Takes  oxycodone PRN. Opioid associated constipation: worse, not improved with OTC stool softener.  Skin Rash: still has chronic rash, on Dexamethasone 73m daily Appetite is good.  ROS:  Review of Systems  Constitutional: Positive for fatigue.  HENT:         Blurry vision.   Eyes: Negative.   Respiratory: Negative.   Cardiovascular: Negative.   Gastrointestinal: Positive for constipation.        bilateral PBDs exchanged recently, capped for internal draining.   Endocrine: Negative.   Genitourinary: Negative.    Musculoskeletal: Negative.   Skin: Positive for rash.       Chronic rash on steroids.   Neurological: Negative.   Hematological: Negative.   Psychiatric/Behavioral: Negative.     MEDICAL HISTORY:  Past Medical History:  Diagnosis Date  . Arthritis   . Cancer ([place]   . Cellulitis   . COPD ([place] obstructive pulmonary disease) ([place]   . DDD (degenerative disc disease), lumbar   . Depression   . Diabetes mellitus   . Diverticulitis   . GERD (gastroesophageal reflux disease)   . GI bleed   . Headache(784.0)   . Hypertension   . Lupus   . Ulcerative colitis ([place]     SURGICAL HISTORY: Past Surgical History:  Procedure Laterality Date  . ABDOMINAL HYSTERECTOMY    . BREAST BIOPSY Right   . COLONOSCOPY    . COLONOSCOPY WITH PROPOFOL N/A 08/18/2016   Procedure: COLONOSCOPY WITH PROPOFOL;  Surgeon: RManya Silvas MD;  [facility];  Service: Endoscopy;  Laterality: N/A;  . ESOPHAGOGASTRODUODENOSCOPY (EGD) WITH PROPOFOL N/A 08/18/2016   Procedure: ESOPHAGOGASTRODUODENOSCOPY (EGD) WITH PROPOFOL;  Surgeon: RManya Silvas MD;  [facility];  Service: Endoscopy;  Laterality: N/A;  . ORIF HUMERUS FRACTURE  09/17/2011   Procedure: OPEN REDUCTION INTERNAL FIXATION (ORIF) PROXIMAL HUMERUS FRACTURE;  Surgeon: SAugustin Schooling MD;  [place]  Service: Orthopedics;  Laterality: Left;    SOCIAL HISTORY: Social History   Social History  . Marital  status: Widowed    Spouse name: N/A  . Number of children: N/A  . Years of education: N/A   Occupational History  . Not on file.   Social History Main Topics  . Smoking status: Former Smoker    Packs/day: 1.00    Years: 10.00    Types: Cigarettes    Quit date: 12/30/1998  . Smokeless tobacco: Never Used     Comment: quit smoking in 2003  . Alcohol use No  . Drug use: No  . Sexual activity: Not on file   Other Topics Concern  . Not on file   Social History Narrative  . No narrative on file    FAMILY HISTORY: Family History  Problem Relation Age of Onset  . Breast cancer Mother 724 . COPD Father   . Prostate cancer Brother     ALLERGIES:  is allergic to feldene [piroxicam]; penicillamine; and qualaquin [quinine].  MEDICATIONS:  Current Outpatient Prescriptions  Medication Sig Dispense Refill  . acetaminophen (TYLENOL) 500 MG tablet Take 1,000 mg by mouth 3 (three) times daily.    .Marland KitchenamLODipine (NORVASC) 5 MG tablet Take 5 mg by mouth daily.    .Marland Kitchenazelastine (ASTELIN) 0.1 % nasal spray Place into the nose.    . calcium carbonate 100 mg/ml SUSP Take by  mouth.    . clobetasol ointment (TEMOVATE) 0.05 % Apply topically.    . [employer] STOOL SOFTENER 8.6-50 MG tablet Take 1 tablet by mouth daily. 90 tablet 3  . Cyanocobalamin (VITAMIN B-12 PO) Take 1,000 mcg by mouth daily.    Marland Kitchen dexamethasone (DECADRON) 1 MG tablet Take 1 tablet (1 mg total) by mouth See admin instructions. Take 2m daily for 1 week.  Then take 273mdaily for 1 week.  Then take 59m83maily for 1 week.  Then take 59mg47mery other day for one week.  Then stop. 43 tablet 0  . estradiol (ESTRACE) 1 MG tablet Take 1 mg by mouth daily.    . gaMarland Kitchenapentin (NEURONTIN) 100 MG capsule Take 100 mg by mouth 3 (three) times daily.     . hydroxychloroquine (PLAQUENIL) 200 MG tablet Take by mouth.    . KLMarland KitchenR-CON 10 10 MEQ tablet Take 2 tablets by mouth daily.  0  . LORazepam (ATIVAN) 0.5 MG tablet Take 1 tablet (0.5 mg total) by  mouth every 6 (six) hours as needed. 30 tablet 0  . metFORMIN (GLUCOPHAGE) 500 MG tablet Take 500 mg by mouth 2 (two) times daily with a meal.    . ondansetron (ZOFRAN) 8 MG tablet Take 1 tablet (8 mg total) by mouth every 12 (twelve) hours as needed. 30 tablet 2  . oxyCODONE (OXY IR/ROXICODONE) 5 MG immediate release tablet Take 1 tablet (5 mg total) by mouth every 6 (six) hours as needed. 120 tablet 0  . pantoprazole (PROTONIX) 40 MG tablet Take 40 mg by mouth daily.    . polyethylene glycol powder (GLYCOLAX/MIRALAX) powder     . prochlorperazine (COMPAZINE) 10 MG tablet Take 10 mg by mouth every 6 (six) hours as needed.    . Sodium Chloride Flush (NORMAL SALINE FLUSH) 0.9 % SOLN 1 mL by Other route 2 (two) times daily. Flush each biliary drain with 1 mL twice daily  0  . traMADol (ULTRAM) 50 MG tablet Take 50 mg by mouth 2 (two) times daily.     . veMarland Kitchenlafaxine XR (EFFEXOR-XR) 75 MG 24 hr capsule Take 225 mg by mouth daily.     . zoMarland Kitchenpidem (AMBIEN) 10 MG tablet Take 1 tablet (10 mg total) by mouth at bedtime as needed. 30 tablet 3   No current facility-administered medications for this visit.       .  PMarland KitchenYSICAL EXAMINATION: ECOG PERFORMANCE STATUS: 1 - Symptomatic but completely ambulatory Vitals:   01/19/17 0911  BP: (!) 145/78  Pulse: (!) 109  Temp: (!) 96.8 F (36 C)   Filed Weights   01/19/17 0911  Weight: 151 lb (68.5 kg)    GENERAL:Alert, no distress and comfortable.  EYES: no pallor or icterus OROPHARYNX: no thrush or ulceration; good dentition  NECK: supple, no masses felt LYMPH:  no palpable lymphadenopathy in the cervical, axillary or inguinal regions LUNGS: clear to auscultation and  No wheeze or crackles HEART/CVS: regular rate & rhythm and no murmurs; No lower extremity edema ABDOMEN: abdomen soft, non-tender and normal bowel sounds. 2 PBD tubes with sutures. Dressing was dry.  Musculoskeletal:no cyanosis of digits and no clubbing  PSYCH: alert & oriented x 3    NEURO: no focal motor/sensory deficits SKIN:  Rashes on her anterior chest wall.   LABORATORY DATA:  I have reviewed the data as listed Lab Results  Component Value Date   WBC 32.0 (H) 01/12/2017   HGB 8.0 (L) 01/12/2017   HCT 24.3 (L)  01/12/2017   MCV 88.7 01/12/2017   PLT 22 (LL) 01/12/2017    Recent Labs  12/29/16 1605 01/05/17 0824 01/12/17 1114  NA 134* 137 136  K 4.8 3.4* 3.5  CL 97* 100* 99*  CO2 26 28 26   GLUCOSE 255* 188* 131*  BUN 19 22* 21*  CREATININE 0.73 0.69 0.97  CALCIUM 9.2 8.9 8.7*  GFRNONAA >60 >60 56*  GFRAA >60 >60 >60  PROT 7.0 6.5 6.2*  ALBUMIN 3.2* 3.1* 3.1*  AST 112* 91* 68*  ALT 107* 84* 72*  ALKPHOS 365* 374* 492*  BILITOT 0.8 0.6 0.6   [facility] CA 19-9   09/14/16 220 --> 09/28/16 176--> 10/12/16 167-->12/07/2016 64-->12/14/2016 60. --> 12/29/2016 87  RADIOGRAPHIC STUDIES: I have personally reviewed the radiological images as listed and agreed with the findings in the report. CT 12/14/2016 [facility] Impression: 1. Slight interval decrease in size of the left hepatic lobe cholangiocarcinoma. 2. Slight interval increase in size of a porta hepatis lymph node. Other nodes are slightly smaller. 3. Multiple bilateral thyroid nodules. Recommend ultrasound for further evaluation. 4. Stable mediastinal lymph nodes.  ASSESSMENT & PLAN:  Cancer Staging Cholangiocarcinoma [facility]) Staging form: Intrahepatic Bile Duct, AJCC 8th Edition - Clinical stage from 12/29/2016: Stage IV (cT1, cNX, pM1) - Signed by Earlie Server, MD on 12/29/2016  1. Cholangiocarcinoma ([place])   2. Neoplasm related pain   3. Constipation due to opioid therapy   4. Systemic lupus erythematosus, unspecified SLE type, unspecified organ involvement status ([place])   5. Anemia due to antineoplastic chemotherapy   6. Symptomatic anemia   7. Dehydration   8. Neoplastic malignant related fatigue    1 Ok to proceed Cycle 5 Gemcitabine (87m/m2) and Cisplatin (265mm2) 2 Pain  is controlled. Patient reports having enough medication at home. On oxycodone 1030m6h PRN as needed.  3 Constipation due to Opiod: start Sennakot 2tabs daily. Marilax PRN 4 Skin rash/lupus, follows up with Rhematology. She is on Plaquenil and Dexamethasone [facility]mg57mily [facility], will keep her on 3mg. 53msent to Pharmacy.  5/6 Symptomatic Anemia due to chemotherapy and neoplasm: plan type and cross and transfuse one unit tomorrow.  7 Dehydration: creatinine slightly up, also mild hyponatremia. Will give 500ml n31ml saline along with her chemo today. 8 Fatigue: could be a combination of chemotherapy and tapering of dexamethasone, and symptomatic anemia.. Continue 3mg Dex54mily   All questions were answered. The patient knows to call the clinic with any problems questions or concerns.  Return of visit: 1 week for cycle 5 Day 8 Gemcitabine and Cisplatin / onpro.   Kennidee Heyne,Earlie ServerD Hematology Oncology [id number]

## 2017-01-19 NOTE — Progress Notes (Signed)
Per Dr. Tasia Catchings, proceed with treatment today, she has reviewed labs results.  Patient will return tomorrow for blood transfusion.

## 2017-01-19 NOTE — Progress Notes (Signed)
Patient here today for follow up.   

## 2017-01-20 ENCOUNTER — Inpatient Hospital Stay: Payer: PPO

## 2017-01-20 ENCOUNTER — Other Ambulatory Visit: Payer: Self-pay | Admitting: *Deleted

## 2017-01-20 DIAGNOSIS — D649 Anemia, unspecified: Secondary | ICD-10-CM

## 2017-01-20 DIAGNOSIS — Z5111 Encounter for antineoplastic chemotherapy: Secondary | ICD-10-CM | POA: Diagnosis not present

## 2017-01-20 MED ORDER — SODIUM CHLORIDE 0.9 % IV SOLN
Freq: Once | INTRAVENOUS | Status: AC
  Administered 2017-01-20: 11:00:00 via INTRAVENOUS
  Filled 2017-01-20: qty 1000

## 2017-01-20 NOTE — Telephone Encounter (Signed)
Open in error

## 2017-01-21 ENCOUNTER — Telehealth: Payer: Self-pay | Admitting: *Deleted

## 2017-01-21 NOTE — Telephone Encounter (Signed)
Per Dr Tasia Catchings, patient is to continue 3 mg daily of Dex and prescription was sent in 01/19/17. Left voice mail message on her phone regarding this

## 2017-01-21 NOTE — Telephone Encounter (Signed)
Patient called stating confusion on what she is to do in regards to her Dexamethasone. She takes her last dose of 3 mg daily today; asking if it is to be refilled or does she taper off or stop it. Please advise

## 2017-01-22 LAB — BPAM RBC
Blood Product Expiration Date: 201810292359
ISSUE DATE / TIME: 201810041101
Unit Type and Rh: 5100

## 2017-01-22 LAB — TYPE AND SCREEN
ABO/RH(D): O POS
ANTIBODY SCREEN: POSITIVE
Donor AG Type: NEGATIVE
Unit division: 0

## 2017-01-24 DIAGNOSIS — J449 Chronic obstructive pulmonary disease, unspecified: Secondary | ICD-10-CM | POA: Diagnosis not present

## 2017-01-26 ENCOUNTER — Inpatient Hospital Stay: Payer: PPO

## 2017-01-26 ENCOUNTER — Inpatient Hospital Stay (HOSPITAL_BASED_OUTPATIENT_CLINIC_OR_DEPARTMENT_OTHER): Payer: PPO | Admitting: Oncology

## 2017-01-26 VITALS — BP 135/82 | HR 97 | Temp 97.9°F | Wt 150.0 lb

## 2017-01-26 DIAGNOSIS — G893 Neoplasm related pain (acute) (chronic): Secondary | ICD-10-CM | POA: Diagnosis not present

## 2017-01-26 DIAGNOSIS — E860 Dehydration: Secondary | ICD-10-CM

## 2017-01-26 DIAGNOSIS — R530 Neoplastic (malignant) related fatigue: Secondary | ICD-10-CM

## 2017-01-26 DIAGNOSIS — M5136 Other intervertebral disc degeneration, lumbar region: Secondary | ICD-10-CM | POA: Diagnosis not present

## 2017-01-26 DIAGNOSIS — M329 Systemic lupus erythematosus, unspecified: Secondary | ICD-10-CM | POA: Diagnosis not present

## 2017-01-26 DIAGNOSIS — Z87891 Personal history of nicotine dependence: Secondary | ICD-10-CM

## 2017-01-26 DIAGNOSIS — T402X5S Adverse effect of other opioids, sequela: Secondary | ICD-10-CM | POA: Diagnosis not present

## 2017-01-26 DIAGNOSIS — E119 Type 2 diabetes mellitus without complications: Secondary | ICD-10-CM

## 2017-01-26 DIAGNOSIS — J449 Chronic obstructive pulmonary disease, unspecified: Secondary | ICD-10-CM

## 2017-01-26 DIAGNOSIS — Z5111 Encounter for antineoplastic chemotherapy: Secondary | ICD-10-CM

## 2017-01-26 DIAGNOSIS — Z8719 Personal history of other diseases of the digestive system: Secondary | ICD-10-CM

## 2017-01-26 DIAGNOSIS — F329 Major depressive disorder, single episode, unspecified: Secondary | ICD-10-CM

## 2017-01-26 DIAGNOSIS — Z8619 Personal history of other infectious and parasitic diseases: Secondary | ICD-10-CM

## 2017-01-26 DIAGNOSIS — R21 Rash and other nonspecific skin eruption: Secondary | ICD-10-CM

## 2017-01-26 DIAGNOSIS — Z79899 Other long term (current) drug therapy: Secondary | ICD-10-CM

## 2017-01-26 DIAGNOSIS — C221 Intrahepatic bile duct carcinoma: Secondary | ICD-10-CM

## 2017-01-26 DIAGNOSIS — K5903 Drug induced constipation: Secondary | ICD-10-CM | POA: Diagnosis not present

## 2017-01-26 DIAGNOSIS — Z803 Family history of malignant neoplasm of breast: Secondary | ICD-10-CM

## 2017-01-26 DIAGNOSIS — K219 Gastro-esophageal reflux disease without esophagitis: Secondary | ICD-10-CM

## 2017-01-26 DIAGNOSIS — I10 Essential (primary) hypertension: Secondary | ICD-10-CM

## 2017-01-26 DIAGNOSIS — Z7984 Long term (current) use of oral hypoglycemic drugs: Secondary | ICD-10-CM

## 2017-01-26 DIAGNOSIS — D649 Anemia, unspecified: Secondary | ICD-10-CM

## 2017-01-26 DIAGNOSIS — Z8042 Family history of malignant neoplasm of prostate: Secondary | ICD-10-CM

## 2017-01-26 DIAGNOSIS — Z7189 Other specified counseling: Secondary | ICD-10-CM

## 2017-01-26 DIAGNOSIS — E042 Nontoxic multinodular goiter: Secondary | ICD-10-CM

## 2017-01-26 LAB — CBC WITH DIFFERENTIAL/PLATELET
Basophils Absolute: 0 10*3/uL (ref 0–0.1)
Basophils Relative: 1 %
EOS ABS: 0 10*3/uL (ref 0–0.7)
Eosinophils Relative: 1 %
HCT: 25.1 % — ABNORMAL LOW (ref 35.0–47.0)
HEMOGLOBIN: 8.4 g/dL — AB (ref 12.0–16.0)
LYMPHS ABS: 0.4 10*3/uL — AB (ref 1.0–3.6)
LYMPHS PCT: 10 %
MCH: 31.5 pg (ref 26.0–34.0)
MCHC: 33.3 g/dL (ref 32.0–36.0)
MCV: 94.6 fL (ref 80.0–100.0)
MONOS PCT: 18 %
Monocytes Absolute: 0.7 10*3/uL (ref 0.2–0.9)
NEUTROS PCT: 70 %
Neutro Abs: 2.8 10*3/uL (ref 1.4–6.5)
Platelets: 143 10*3/uL — ABNORMAL LOW (ref 150–440)
RBC: 2.66 MIL/uL — AB (ref 3.80–5.20)
RDW: 22.3 % — ABNORMAL HIGH (ref 11.5–14.5)
WBC: 3.9 10*3/uL (ref 3.6–11.0)

## 2017-01-26 LAB — COMPREHENSIVE METABOLIC PANEL
ALK PHOS: 444 U/L — AB (ref 38–126)
ALT: 110 U/L — AB (ref 14–54)
ANION GAP: 11 (ref 5–15)
AST: 77 U/L — ABNORMAL HIGH (ref 15–41)
Albumin: 3.1 g/dL — ABNORMAL LOW (ref 3.5–5.0)
BILIRUBIN TOTAL: 0.5 mg/dL (ref 0.3–1.2)
BUN: 17 mg/dL (ref 6–20)
CALCIUM: 8.8 mg/dL — AB (ref 8.9–10.3)
CO2: 26 mmol/L (ref 22–32)
CREATININE: 0.54 mg/dL (ref 0.44–1.00)
Chloride: 97 mmol/L — ABNORMAL LOW (ref 101–111)
GFR calc Af Amer: >60 mL/min (ref >60)
Glucose, Bld: 147 mg/dL — ABNORMAL HIGH (ref 65–99)
Potassium: 4 mmol/L (ref 3.5–5.1)
SODIUM: 134 mmol/L — AB (ref 135–145)
TOTAL PROTEIN: 6.2 g/dL — AB (ref 6.5–8.1)

## 2017-01-26 MED ORDER — PALONOSETRON HCL INJECTION 0.25 MG/5ML
0.2500 mg | Freq: Once | INTRAVENOUS | Status: AC
  Administered 2017-01-26: 0.25 mg via INTRAVENOUS
  Filled 2017-01-26: qty 5

## 2017-01-26 MED ORDER — SODIUM CHLORIDE 0.9 % IV SOLN
1400.0000 mg | Freq: Once | INTRAVENOUS | Status: AC
  Administered 2017-01-26: 1400 mg via INTRAVENOUS
  Filled 2017-01-26: qty 26.3

## 2017-01-26 MED ORDER — POTASSIUM CHLORIDE 2 MEQ/ML IV SOLN
Freq: Once | INTRAVENOUS | Status: AC
  Administered 2017-01-26: 11:00:00 via INTRAVENOUS
  Filled 2017-01-26: qty 1000

## 2017-01-26 MED ORDER — SODIUM CHLORIDE 0.9 % IV SOLN
Freq: Once | INTRAVENOUS | Status: AC
  Administered 2017-01-26: 13:00:00 via INTRAVENOUS
  Filled 2017-01-26: qty 5

## 2017-01-26 MED ORDER — HEPARIN SOD (PORK) LOCK FLUSH 100 UNIT/ML IV SOLN
500.0000 [IU] | Freq: Once | INTRAVENOUS | Status: AC | PRN
  Administered 2017-01-26: 500 [IU]
  Filled 2017-01-26: qty 5

## 2017-01-26 MED ORDER — SODIUM CHLORIDE 0.9 % IV SOLN
Freq: Once | INTRAVENOUS | Status: AC
  Administered 2017-01-26: 11:00:00 via INTRAVENOUS
  Filled 2017-01-26: qty 1000

## 2017-01-26 MED ORDER — SODIUM CHLORIDE 0.9 % IV SOLN
20.0000 mg/m2 | Freq: Once | INTRAVENOUS | Status: AC
  Administered 2017-01-26: 34 mg via INTRAVENOUS
  Filled 2017-01-26: qty 34

## 2017-01-26 MED ORDER — PEGFILGRASTIM 6 MG/0.6ML ~~LOC~~ PSKT
6.0000 mg | PREFILLED_SYRINGE | Freq: Once | SUBCUTANEOUS | Status: AC
  Administered 2017-01-26: 6 mg via SUBCUTANEOUS
  Filled 2017-01-26: qty 0.6

## 2017-01-26 NOTE — Progress Notes (Signed)
ALT: 110. MD, Dr. Tasia Catchings, notified via telephone and already aware. Per MD order: proceed with scheduled treatment today.

## 2017-01-26 NOTE — Progress Notes (Signed)
Hematology/Oncology Follow up note [facility] Telephone:([phone]) [phone] Fax:(336) [phone]  Patient Care Team: Regina Crouch, MD as PCP - General (Internal Medicine) REASON FOR VISIT Follow up for treatment of cholangiocarcinoma  PERTINENT ONCOLOGY HISTORY Regina Hood 76 y.o.  female with PMH listed as below, most significantly for cholangiocarcinoma who has been on chemotherapy treatment presents to transfer her cancer care to our cancer center.  After extensive medical record review, summary of her diagnosis and treatment history are listed below.  1. Initially presented in 07/2016 to an outside ED with lower and upper GI bleed.  A. She was found to have new LFTs elevations including AST 340, ALT 379, Alk phos 420 and t bili 0.9.   B. [place] was obtained, which did not visualize the left lobe.  C. She was evaluated by gastroenterology because of her history of UC and underwent colonoscopy, which was unremarkable per path report.  2. 08/20/16, CT noted diffuse atrophy of the left lobe with left intrahepatic biliary ductal dilatation, with 4.1 cm ill-defined low-attenuation mass. This mass was suspicious for malignancy.  3. 08/2016, MRI/MRCP which showed abnormal left hepatic lobe with diffuse biliary dilatation with a shrunken left hepatic lobe and a 2.42.9 cm oval-shaped masslike component displacing the adjacent bile ducts. There is an approximately 1.6 cm segment truncated bile ducts involving common hepatic duct and right and left hepatic although no dilatation of the right hepatic duct system was seen.   4. 09/02/16, ERCP showed localized biliary stricture in the left intrahepatic duct with dilation of the left intrahepatic branches. Sphincterotomy was performed and a stent was placed. EUS with a 3.3 cm mass and left duct dilation.  5. Repeat ERCP was done 09/10/16, during which the stent was exchanged.  6. 09/20/16, taken to OR and noted to a have an peritoneal  implant with biopsy c/w adenocarcinoma. The neoplastic cells are positive for CK7. They are negative for CK20, CDX-2, TTF-1, Napsin A, hep-par1, glypican 3, and arginase. The immunohistochemical features are consistent with adenocarcinoma of upper gastrointestinal tract or pancreaticobiliary origin.  7. 09/28/16, initially seen in medical oncology clinic by Dr. Filomena Hood 8. 09/29/16, admitted for biliary obstruction and cholangitis, s/p PBD placement, d/c 10/06/16 9. 10/12/16, initiated on gemcitabine (400 mg/m2). CA 19-9 167 10. 10/26/16, increased gemcitabine (800 mg/m2) 11. 11/23/2016 Cycle 3 Gemzar (864m/m2), 12/07/2016, Gemzar (8035mm2) added cisplatin 2533m2  12. 12/14/2016, CT CAP shows slight interval decrease in size of L hepatic lobe cholangio. Slight interval increase in size of porta hepatis lymph node whiel other nodes are slightly smaller. Multiple bilateral thyroid nodules. Stable mediastinal lymph nodes. CA 19-9 60 14  8/30 Cycle 4 Gemzar (800m39m) added cisplatin 20mg23m 15, 12/28/2016 Gemcitabine 800mg/25mcisplatin was hold due to anemia. S/p 1 PRBC transfusion.          She forgot her appointment on day 8 and Gemcitabine 800mg/m2md cisplatin 20mg/m254m given  Day 9 of cycle 4.   16 She has been on Dexamethasone 4mg [id number] skin rash since this summer by [facility], on Dexamethasone 3mg since33m21/2018.  INTERVAL HISTORY Regina L SmitMARIADELOSANG WYNNSf100ale with above history reviewed by me today who presents for evaluation prior to cycle 5 chemotherapy Gemcitabine and Cisplatin treatment. Today is Day 8 treatment.  Problems and complaints are listed below: Chemotherapy induced nausea: stable.  Neoplasm related fatigue: still have severe fatigue .blood transfusion did not help.  Neoplasm related pain scale:She oxycodone  PRN.Denies pain today Opioid associated constipation: worse, not improved with OTC stool softener.  Skin Rash: still has chronic rash, slightly  better , on Dexamethasone 69m daily. Appetite is good.  Blurry vision, she was seen by ophthalmology, she has cataract.    Review of Systems  Review of Systems  Constitutional: Negative for fever, night sweats,unintentional weight loss, change in appetite. (+) fatigue HENT: Negative for ear pain, hearing loss, nasal bleeding.  Eyes: Negative for eye pain, double vision.Blurry vision.  Respiratory: Negative for wheezing, shortness of breath, cough Cardiovascular: Negative for chest pain, palpitation.   Gastrointestinal: Negative abdominal pain, diarrhea, nausea vomiting Endocrine: Negative  Genitourinary: Negative for dysuria, hematuria, frequency. bilateral PBDs, recently, capped for internal draining. Skin: chronic rash, slightly better Neurological: Negative for headache, dizziness, seizure Hematological: Negative for easy bruising/bleeding, lymph node enlargement Psychiatric/Behavioral: Negative for depression, anxiety, suicidality  MEDICAL HISTORY:  Past Medical History:  Diagnosis Date  . Arthritis   . Cancer ([place]   . Cellulitis   . COPD ([place] obstructive [place] disease) [place]   . DDD (degenerative disc disease), lumbar   . Depression   . Diabetes mellitus   . Diverticulitis   . GERD (gastroesophageal reflux disease)   . GI bleed   . Headache(784.0)   . Hypertension   . Lupus   . Ulcerative colitis ([place]     SURGICAL HISTORY: Past Surgical History:  Procedure Laterality Date  . ABDOMINAL HYSTERECTOMY    . BREAST BIOPSY Right   . COLONOSCOPY    . COLONOSCOPY WITH PROPOFOL N/A 08/18/2016   Procedure: COLONOSCOPY WITH PROPOFOL;  Surgeon: RManya Silvas MD;  [facility];  Service: Endoscopy;  Laterality: N/A;  . ESOPHAGOGASTRODUODENOSCOPY (EGD) WITH PROPOFOL N/A 08/18/2016   Procedure: ESOPHAGOGASTRODUODENOSCOPY (EGD) WITH PROPOFOL;  Surgeon: RManya Silvas MD;  [facility];  Service: Endoscopy;  Laterality: N/A;  . ORIF HUMERUS  FRACTURE  09/17/2011   Procedure: OPEN REDUCTION INTERNAL FIXATION (ORIF) PROXIMAL HUMERUS FRACTURE;  Surgeon: SAugustin Schooling MD;  [place]  Service: Orthopedics;  Laterality: Left;    SOCIAL HISTORY: Social History   Social History  . Marital status: Widowed    Spouse name: N/A  . Number of children: N/A  . Years of education: N/A   Occupational History  . Not on file.   Social History Main Topics  . Smoking status: Former Smoker    Packs/day: 1.00    Years: 10.00    Types: Cigarettes    Quit date: 12/30/1998  . Smokeless tobacco: Never Used     Comment: quit smoking in 2003  . Alcohol use No  . Drug use: No  . Sexual activity: Not on file   Other Topics Concern  . Not on file   Social History Narrative  . No narrative on file    FAMILY HISTORY: Family History  Problem Relation Age of Onset  . Breast cancer Mother 764 . COPD Father   . Prostate cancer Brother     ALLERGIES:  is allergic to feldene [piroxicam]; penicillamine; and qualaquin [quinine].  MEDICATIONS:  Current Outpatient Prescriptions  Medication Sig Dispense Refill  . acetaminophen (TYLENOL) 500 MG tablet Take 1,000 mg by mouth 3 (three) times daily.    .Marland KitchenamLODipine (NORVASC) 5 MG tablet Take 5 mg by mouth daily.    .Marland Kitchenazelastine (ASTELIN) 0.1 % nasal spray Place into the nose.    . calcium carbonate 100 mg/ml SUSP Take by mouth.    .Marland Kitchen  clobetasol ointment (TEMOVATE) 0.05 % Apply topically.    . [employer] STOOL SOFTENER 8.6-50 MG tablet Take 1 tablet by mouth daily. 90 tablet 3  . Cyanocobalamin (VITAMIN B-12 PO) Take 1,000 mcg by mouth daily.    Marland Kitchen dexamethasone (DECADRON) 1 MG tablet Take 3 tablets (3 mg total) by mouth daily. 60 tablet 0  . estradiol (ESTRACE) 1 MG tablet Take 1 mg by mouth daily.    Marland Kitchen gabapentin (NEURONTIN) 100 MG capsule Take 100 mg by mouth 3 (three) times daily.     . hydroxychloroquine (PLAQUENIL) 200 MG tablet Take by mouth.    Marland Kitchen KLOR-CON 10 10 MEQ tablet Take 2 tablets  by mouth daily.  0  . LORazepam (ATIVAN) 0.5 MG tablet Take 1 tablet (0.5 mg total) by mouth every 6 (six) hours as needed. 30 tablet 0  . metFORMIN (GLUCOPHAGE) 500 MG tablet Take 500 mg by mouth 2 (two) times daily with a meal.    . ondansetron (ZOFRAN) 8 MG tablet Take 1 tablet (8 mg total) by mouth every 12 (twelve) hours as needed. 30 tablet 2  . oxyCODONE (OXY IR/ROXICODONE) 5 MG immediate release tablet Take 1 tablet (5 mg total) by mouth every 6 (six) hours as needed. 120 tablet 0  . pantoprazole (PROTONIX) 40 MG tablet Take 40 mg by mouth daily.    . polyethylene glycol powder (GLYCOLAX/MIRALAX) powder     . prochlorperazine (COMPAZINE) 10 MG tablet Take 10 mg by mouth every 6 (six) hours as needed.    . senna (SENOKOT) 8.6 MG TABS tablet Take 2 tablets (17.2 mg total) by mouth daily. Hold for loose stools. 120 each 0  . Sodium Chloride Flush (NORMAL SALINE FLUSH) 0.9 % SOLN 1 mL by Other route 2 (two) times daily. Flush each biliary drain with 1 mL twice daily  0  . traMADol (ULTRAM) 50 MG tablet Take 50 mg by mouth 2 (two) times daily.     Marland Kitchen venlafaxine XR (EFFEXOR-XR) 75 MG 24 hr capsule Take 225 mg by mouth daily.     Marland Kitchen zolpidem (AMBIEN) 10 MG tablet Take 1 tablet (10 mg total) by mouth at bedtime as needed. 30 tablet 3   No current facility-administered medications for this visit.       Marland Kitchen  PHYSICAL EXAMINATION: ECOG PERFORMANCE STATUS: 1 - Symptomatic but completely ambulatory Vitals:   01/26/17 0905  BP: 135/82  Pulse: 97  Temp: 97.9 F (36.6 C)   Filed Weights   01/26/17 0905  Weight: 150 lb (68 kg)    GENERAL: No distress, well nourished.  SKIN:  No rashes or significant lesions  HEAD: Normocephalic, No masses, lesions, tenderness or abnormalities  EYES: Conjunctiva are pink, non icteric ENT: External ears normal ,lips , buccal mucosa, and tongue normal and mucous membranes are moist  LYMPH: No palpable cervical and axillary lymphadenopathy  LUNGS: Clear to  auscultation, no crackles or wheezes HEART: Regular rate & rhythm, no murmurs, no gallops, S1 normal and S2 normal  ABDOMEN: Abdomen soft, non-tender, normal bowel sounds, bdomen soft, non-tender and normal bowel sounds. 2 PBD tubes with sutures.  MUSCULOSKELETAL: No CVA tenderness and no tenderness on percussion of the back or rib cage.  EXTREMITIES: No edema, no skin discoloration or tenderness NEURO: Alert & oriented, no focal motor/sensory deficits.  LABORATORY DATA:  I have reviewed the data as listed Lab Results  Component Value Date   WBC 3.9 01/26/2017   HGB 8.4 (L) 01/26/2017   HCT  25.1 (L) 01/26/2017   MCV 94.6 01/26/2017   PLT 143 (L) 01/26/2017    Recent Labs  01/12/17 1114 01/19/17 0844 01/26/17 0841  NA 136 134* 134*  K 3.5 4.1 4.0  CL 99* 98* 97*  CO2 _0 GLUCOSE 131* 197* 147*  BUN 21* 20 17  CREATININE 0.97 1.05* 0.54  CALCIUM 8.7* 8.7* 8.8*  GFRNONAA 56* 51* >60  GFRAA >60 59* >60  PROT 6.2* 6.5 6.2*  ALBUMIN 3.1* 3.0* 3.1*  AST 68* 95* 77*  ALT 72* 75* 110*  ALKPHOS 492* 477* 444*  BILITOT 0.6 0.7 0.5   [facility] CA 19-9   09/14/16 220 --> 09/28/16 176--> 10/12/16 167-->12/07/2016 64-->12/14/2016 60. --> 12/29/2016 87  RADIOGRAPHIC STUDIES: I have personally reviewed the radiological images as listed and agreed with the findings in the report. CT 12/14/2016 [facility] Impression: 1. Slight interval decrease in size of the left hepatic lobe cholangiocarcinoma. 2. Slight interval increase in size of a porta hepatis lymph node. Other nodes are slightly smaller. 3. Multiple bilateral thyroid nodules. Recommend ultrasound for further evaluation. 4. Stable mediastinal lymph nodes.  ASSESSMENT & PLAN:  Cancer Staging Cholangiocarcinoma [facility]) Staging form: Intrahepatic Bile Duct, AJCC 8th Edition - Clinical stage from 12/29/2016: Stage IV (cT1, cNX, pM1) - Signed by Earlie Server, MD on 12/29/2016  1. Cholangiocarcinoma ([place])   2.  Symptomatic anemia   3. Neoplastic malignant related fatigue   4. Neoplasm related pain   5. Goals of care, counseling/discussion   6. Systemic lupus erythematosus, unspecified SLE type, unspecified organ involvement status ([place])   7. Encounter for antineoplastic chemotherapy    1 Ok to proceed Cycle 5 Day 8 Gemcitabine (872m/m2) and Cisplatin (242mm2), She will receive onpro.  CA19-9 pending. Plan repeat CT scan after this cycle 6.  2/3 symptomatic anemia/ Fatigue: could be a combination of chemotherapy and tapering of dexamethasone, and symptomatic anemia.. Continue 22m422mex daily. S/p 1 unit blood transfusion last week , no improvement.  Repeat CBC in 7 days.  2 Pain is controlled. Patient reports having enough medication at home. On oxycodone 24m81mh PRN as needed.  3 Constipation due to Opiod: continue Sennakot 2tabs daily. Marilax PRN 4 Skin rash/lupus, follows up with Rhematology. She is on Plaquenil and Dexamethasone 4mg 7mly [facility]21[facility], will keep her on 22mg. 512mferred to Palliative care medicine for symptom control.  All questions were answered. The patient knows to call the clinic with any problems questions or concerns.  Return of visit: 1 week for toxicity evaluation.  She will see Dr.Finnegan who covers me on 10/24 prior to cycle 6 Day 1 and 02/16/2017 day 8 treatment    Charna Neeb YuEarlie ServerhD Hematology Oncology [facility] [id number]

## 2017-01-27 LAB — CANCER ANTIGEN 19-9: CAN 19-9: 99 U/mL — AB (ref 0–35)

## 2017-02-02 ENCOUNTER — Other Ambulatory Visit: Payer: Self-pay

## 2017-02-02 ENCOUNTER — Other Ambulatory Visit: Payer: Self-pay | Admitting: *Deleted

## 2017-02-02 ENCOUNTER — Encounter: Payer: Self-pay | Admitting: Oncology

## 2017-02-02 ENCOUNTER — Inpatient Hospital Stay: Payer: PPO

## 2017-02-02 ENCOUNTER — Inpatient Hospital Stay (HOSPITAL_BASED_OUTPATIENT_CLINIC_OR_DEPARTMENT_OTHER): Payer: PPO | Admitting: Oncology

## 2017-02-02 ENCOUNTER — Encounter (INDEPENDENT_AMBULATORY_CARE_PROVIDER_SITE_OTHER): Payer: Self-pay

## 2017-02-02 VITALS — BP 126/68 | HR 105 | Temp 97.9°F | Wt 153.0 lb

## 2017-02-02 DIAGNOSIS — F329 Major depressive disorder, single episode, unspecified: Secondary | ICD-10-CM

## 2017-02-02 DIAGNOSIS — C221 Intrahepatic bile duct carcinoma: Secondary | ICD-10-CM

## 2017-02-02 DIAGNOSIS — E860 Dehydration: Secondary | ICD-10-CM

## 2017-02-02 DIAGNOSIS — D6959 Other secondary thrombocytopenia: Secondary | ICD-10-CM | POA: Diagnosis not present

## 2017-02-02 DIAGNOSIS — J449 Chronic obstructive pulmonary disease, unspecified: Secondary | ICD-10-CM

## 2017-02-02 DIAGNOSIS — I10 Essential (primary) hypertension: Secondary | ICD-10-CM

## 2017-02-02 DIAGNOSIS — M329 Systemic lupus erythematosus, unspecified: Secondary | ICD-10-CM | POA: Diagnosis not present

## 2017-02-02 DIAGNOSIS — M5136 Other intervertebral disc degeneration, lumbar region: Secondary | ICD-10-CM

## 2017-02-02 DIAGNOSIS — Z803 Family history of malignant neoplasm of breast: Secondary | ICD-10-CM

## 2017-02-02 DIAGNOSIS — D696 Thrombocytopenia, unspecified: Secondary | ICD-10-CM

## 2017-02-02 DIAGNOSIS — Z8719 Personal history of other diseases of the digestive system: Secondary | ICD-10-CM

## 2017-02-02 DIAGNOSIS — G893 Neoplasm related pain (acute) (chronic): Secondary | ICD-10-CM

## 2017-02-02 DIAGNOSIS — D649 Anemia, unspecified: Secondary | ICD-10-CM

## 2017-02-02 DIAGNOSIS — T402X5S Adverse effect of other opioids, sequela: Secondary | ICD-10-CM | POA: Diagnosis not present

## 2017-02-02 DIAGNOSIS — E042 Nontoxic multinodular goiter: Secondary | ICD-10-CM

## 2017-02-02 DIAGNOSIS — K219 Gastro-esophageal reflux disease without esophagitis: Secondary | ICD-10-CM

## 2017-02-02 DIAGNOSIS — R530 Neoplastic (malignant) related fatigue: Secondary | ICD-10-CM

## 2017-02-02 DIAGNOSIS — Z87891 Personal history of nicotine dependence: Secondary | ICD-10-CM

## 2017-02-02 DIAGNOSIS — Z5111 Encounter for antineoplastic chemotherapy: Secondary | ICD-10-CM | POA: Diagnosis not present

## 2017-02-02 DIAGNOSIS — R21 Rash and other nonspecific skin eruption: Secondary | ICD-10-CM

## 2017-02-02 DIAGNOSIS — K5903 Drug induced constipation: Secondary | ICD-10-CM

## 2017-02-02 DIAGNOSIS — E119 Type 2 diabetes mellitus without complications: Secondary | ICD-10-CM

## 2017-02-02 DIAGNOSIS — Z79899 Other long term (current) drug therapy: Secondary | ICD-10-CM

## 2017-02-02 DIAGNOSIS — T402X5A Adverse effect of other opioids, initial encounter: Secondary | ICD-10-CM

## 2017-02-02 DIAGNOSIS — Z8619 Personal history of other infectious and parasitic diseases: Secondary | ICD-10-CM

## 2017-02-02 DIAGNOSIS — Z8042 Family history of malignant neoplasm of prostate: Secondary | ICD-10-CM

## 2017-02-02 DIAGNOSIS — Z7189 Other specified counseling: Secondary | ICD-10-CM

## 2017-02-02 DIAGNOSIS — Z7984 Long term (current) use of oral hypoglycemic drugs: Secondary | ICD-10-CM

## 2017-02-02 LAB — CBC WITH DIFFERENTIAL/PLATELET
BASOS ABS: 0.1 10*3/uL (ref 0–0.1)
BASOS PCT: 1 %
EOS ABS: 0.1 10*3/uL (ref 0–0.7)
Eosinophils Relative: 0 %
HCT: 23.4 % — ABNORMAL LOW (ref 35.0–47.0)
HEMOGLOBIN: 7.7 g/dL — AB (ref 12.0–16.0)
Lymphocytes Relative: 5 %
Lymphs Abs: 1 10*3/uL (ref 1.0–3.6)
MCH: 31.5 pg (ref 26.0–34.0)
MCHC: 32.8 g/dL (ref 32.0–36.0)
MCV: 96.1 fL (ref 80.0–100.0)
MONOS PCT: 6 %
Monocytes Absolute: 1.3 10*3/uL — ABNORMAL HIGH (ref 0.2–0.9)
NEUTROS PCT: 88 %
Neutro Abs: 17.2 10*3/uL — ABNORMAL HIGH (ref 1.4–6.5)
Platelets: 14 10*3/uL — CL (ref 150–400)
RBC: 2.44 MIL/uL — ABNORMAL LOW (ref 3.80–5.20)
RDW: 21.7 % — ABNORMAL HIGH (ref 11.5–14.5)
WBC: 19.7 10*3/uL — AB (ref 3.6–11.0)

## 2017-02-02 LAB — COMPREHENSIVE METABOLIC PANEL
ALBUMIN: 3.2 g/dL — AB (ref 3.5–5.0)
ALK PHOS: 457 U/L — AB (ref 38–126)
ALT: 83 U/L — ABNORMAL HIGH (ref 14–54)
ANION GAP: 9 (ref 5–15)
AST: 63 U/L — ABNORMAL HIGH (ref 15–41)
BUN: 20 mg/dL (ref 6–20)
CALCIUM: 8.6 mg/dL — AB (ref 8.9–10.3)
CO2: 28 mmol/L (ref 22–32)
Chloride: 99 mmol/L — ABNORMAL LOW (ref 101–111)
Creatinine, Ser: 0.53 mg/dL (ref 0.44–1.00)
GFR calc Af Amer: >60 mL/min (ref >60)
GFR calc non Af Amer: >60 mL/min (ref >60)
GLUCOSE: 186 mg/dL — AB (ref 65–99)
POTASSIUM: 3.5 mmol/L (ref 3.5–5.1)
SODIUM: 136 mmol/L (ref 135–145)
Total Bilirubin: 0.6 mg/dL (ref 0.3–1.2)
Total Protein: 6.1 g/dL — ABNORMAL LOW (ref 6.5–8.1)

## 2017-02-02 LAB — IRON AND TIBC
IRON: 165 ug/dL (ref 28–170)
SATURATION RATIOS: 58 % — AB (ref 10.4–31.8)
TIBC: 286 ug/dL (ref 250–450)
UIBC: 121 ug/dL

## 2017-02-02 LAB — SAMPLE TO BLOOD BANK

## 2017-02-02 LAB — FERRITIN: FERRITIN: 712 ng/mL — AB (ref 11–307)

## 2017-02-02 LAB — PREPARE RBC (CROSSMATCH)

## 2017-02-02 MED ORDER — DIPHENHYDRAMINE HCL 25 MG PO CAPS
25.0000 mg | ORAL_CAPSULE | Freq: Once | ORAL | Status: AC
  Administered 2017-02-02: 25 mg via ORAL
  Filled 2017-02-02: qty 1

## 2017-02-02 MED ORDER — ACETAMINOPHEN 325 MG PO TABS
650.0000 mg | ORAL_TABLET | Freq: Once | ORAL | Status: AC
Start: 2017-02-02 — End: 2017-02-02
  Administered 2017-02-02: 650 mg via ORAL
  Filled 2017-02-02: qty 2

## 2017-02-02 MED ORDER — SODIUM CHLORIDE 0.9 % IV SOLN
250.0000 mL | Freq: Once | INTRAVENOUS | Status: AC
  Administered 2017-02-02: 250 mL via INTRAVENOUS
  Filled 2017-02-02: qty 250

## 2017-02-02 NOTE — Progress Notes (Signed)
Hematology/Oncology Follow up note [facility] [phone] [phone]  Patient Care Team: Regina Crouch, MD as PCP - General (Internal Medicine) REASON FOR VISIT Follow up for treatment of cholangiocarcinoma  PERTINENT ONCOLOGY HISTORY Regina Hood 76 y.o.  female with PMH listed as below, most significantly for cholangiocarcinoma who has been on chemotherapy treatment presents to transfer her cancer care to our cancer center.  After extensive medical record review, summary of her diagnosis and treatment history are listed below.  1. Initially presented in 07/2016 to an outside ED with lower and upper GI bleed.  A. She was found to have new LFTs elevations including AST 340, ALT 379, Alk phos 420 and t bili 0.9.   B. [place] was obtained, which did not visualize the left lobe.  C. She was evaluated by gastroenterology because of her history of UC and underwent colonoscopy, which was unremarkable per path report.  2. 08/20/16, CT noted diffuse atrophy of the left lobe with left intrahepatic biliary ductal dilatation, with 4.1 cm ill-defined low-attenuation mass. This mass was suspicious for malignancy.  3. 08/2016, MRI/MRCP which showed abnormal left hepatic lobe with diffuse biliary dilatation with a shrunken left hepatic lobe and a 2.42.9 cm oval-shaped masslike component displacing the adjacent bile ducts. There is an approximately 1.6 cm segment truncated bile ducts involving common hepatic duct and right and left hepatic although no dilatation of the right hepatic duct system was seen.   4. 09/02/16, ERCP showed localized biliary stricture in the left intrahepatic duct with dilation of the left intrahepatic branches. Sphincterotomy was performed and a stent was placed. EUS with a 3.3 cm mass and left duct dilation.  5. Repeat ERCP was done 09/10/16, during which the stent was exchanged.  6. 09/20/16, taken to OR and noted to a have an peritoneal  implant with biopsy c/w adenocarcinoma. The neoplastic cells are positive for CK7. They are negative for CK20, CDX-2, TTF-1, Napsin A, hep-par1, glypican 3, and arginase. The immunohistochemical features are consistent with adenocarcinoma of upper gastrointestinal tract or pancreaticobiliary origin.  7. 09/28/16, initially seen in medical oncology clinic by Dr. Filomena Hood 8. 09/29/16, admitted for biliary obstruction and cholangitis, s/p PBD placement, d/c 10/06/16 9. 10/12/16, initiated on gemcitabine (400 mg/m2). CA 19-9 167 10. 10/26/16, increased gemcitabine (800 mg/m2) 11. 11/23/2016 Cycle 3 Gemzar (851m/m2), 12/07/2016, Gemzar (8049mm2) added cisplatin 252m2  12. 12/14/2016, CT CAP shows slight interval decrease in size of L hepatic lobe cholangio. Slight interval increase in size of porta hepatis lymph node whiel other nodes are slightly smaller. Multiple bilateral thyroid nodules. Stable mediastinal lymph nodes. CA 19-9 60 14  8/30 Cycle 4 Gemzar (800m88m) added cisplatin 20mg17m 15, 12/28/2016 Gemcitabine 800mg/58mcisplatin was hold due to anemia. S/p 1 PRBC transfusion.   [facility] [address]   [facility] [place]    She forgot her appointment on day 8 and Gemcitabine 800mg/m42md cisplatin 20mg/m233m given  Day 9 of cycle 4 (01/05/2017).        01/19/2017  Cycle 5 Day 1Gemcitabine 800mg/m2,14mplatin 20mg/m2, 5munit blood transfusion.        01/26/2017 Cycle 5 Day 8 Gemcitabine 800mg/m2, c59matin 20mg/m2   169me has been on Dexamethasone 4mg daily fo52mkin rash since this [facility] 3mg since 9/254m018.  INTERVAL HISTORY Regina Hood 75LAQUASIA PINCUSl59with above  history reviewed by me today who presents for evaluation prior to cycle 5 chemotherapy Gemcitabine and Cisplatin treatment. Today is Day 15. She is here for evaluation of treatment toxicity.  Problems and complaints are listed below: Chemotherapy induced nausea: stable.    Neoplasm related fatigue: profound weakness and fatigue . Neoplasm related pain scale:She oxycodone PRN.Denies pain today Opioid associated constipation: better, daily bowel movement, although stool is hard.  Skin Rash due to chronic rheumatology disorder: still has chronic rash, stable. on Dexamethasone 64m daily. Easy bruising since last week. No active bleeding.  Appetite is very good. Blurry vision, she was seen by ophthalmology, she has cataract.    Review of Systems - OncologyReview of Systems  Constitutional: Negative for fever, night sweats,unintentional weight loss, change in appetite. (+) fatigue HENT: Negative for ear pain, hearing loss, nasal bleeding.  Eyes: Negative for eye pain, double vision.Blurry vision.  Respiratory: Negative for wheezing, shortness of breath, cough Cardiovascular: Negative for chest pain, palpitation.   Gastrointestinal: Negative abdominal pain, diarrhea, nausea vomiting Endocrine: Negative  Genitourinary: Negative for dysuria, hematuria, frequency. bilateral PBDs, recently, capped for internal draining. Skin: chronic rash, slightly better Neurological: Negative for headache, dizziness, seizure Hematological: Positive for  easy bruising/bleeding, no lymph node enlargement Psychiatric/Behavioral: Negative for depression, anxiety, suicidality  MEDICAL HISTORY:  Past Medical History:  Diagnosis Date  . Arthritis   . Cancer ([place]   . Cellulitis   . COPD (chronic obstructive pulmonary disease) ([place]   . DDD (degenerative disc disease), lumbar   . Depression   . Diabetes mellitus   . Diverticulitis   . GERD (gastroesophageal reflux disease)   . GI bleed   . Headache(784.0)   . Hypertension   . Lupus   . Ulcerative colitis ([place]     SURGICAL HISTORY: Past Surgical History:  Procedure Laterality Date  . ABDOMINAL HYSTERECTOMY    . BREAST BIOPSY Right   . COLONOSCOPY    . COLONOSCOPY WITH PROPOFOL N/A 08/18/2016   Procedure: COLONOSCOPY WITH  PROPOFOL;  Surgeon: RManya Silvas MD;  [facility];  Service: Endoscopy;  Laterality: N/A;  . ESOPHAGOGASTRODUODENOSCOPY (EGD) WITH PROPOFOL N/A 08/18/2016   Procedure: ESOPHAGOGASTRODUODENOSCOPY (EGD) WITH PROPOFOL;  Surgeon: RManya Silvas MD;  [facility];  Service: Endoscopy;  Laterality: N/A;  . ORIF HUMERUS FRACTURE  09/17/2011   Procedure: OPEN REDUCTION INTERNAL FIXATION (ORIF) PROXIMAL HUMERUS FRACTURE;  Surgeon: SAugustin Schooling MD;  [place]  Service: Orthopedics;  Laterality: Left;    SOCIAL HISTORY: Social History   Social History  . Marital status: Widowed    Spouse name: N/A  . Number of children: N/A  . Years of education: N/A   Occupational History  . Not on file.   Social History Main Topics  . Smoking status: Former Smoker    Packs/day: 1.00    Years: 10.00    Types: Cigarettes    Quit date: 12/30/1998  . Smokeless tobacco: Never Used     Comment: quit smoking in 2003  . Alcohol use No  . Drug use: No  . Sexual activity: Not on file   Other Topics Concern  . Not on file   Social History Narrative  . No narrative on file    FAMILY HISTORY: Family History  Problem Relation Age of Onset  . Breast cancer Mother 752 . COPD Father   . Prostate cancer Brother     ALLERGIES:  is allergic to feldene [piroxicam]; penicillamine; and qualaquin [quinine].  MEDICATIONS:  Current Outpatient Prescriptions  Medication Sig Dispense Refill  . acetaminophen (TYLENOL) 500 MG tablet Take 1,000 mg by mouth 3 (three) times daily.    Marland Kitchen amLODipine (NORVASC) 5 MG tablet Take 5 mg by mouth daily.    Marland Kitchen azelastine (ASTELIN) 0.1 % nasal spray Place into the nose.    . calcium carbonate 100 mg/ml SUSP Take by mouth.    . clobetasol ointment (TEMOVATE) 0.05 % Apply topically.    . [employer] STOOL SOFTENER 8.6-50 MG tablet Take 1 tablet by mouth daily. 90 tablet 3  . Cyanocobalamin (VITAMIN B-12 PO) Take 1,000 mcg by mouth daily.    Marland Kitchen  dexamethasone (DECADRON) 1 MG tablet Take 3 tablets (3 mg total) by mouth daily. 60 tablet 0  . estradiol (ESTRACE) 1 MG tablet Take 1 mg by mouth daily.    Marland Kitchen gabapentin (NEURONTIN) 100 MG capsule Take 100 mg by mouth 3 (three) times daily.     . hydroxychloroquine (PLAQUENIL) 200 MG tablet Take by mouth.    Marland Kitchen KLOR-CON 10 10 MEQ tablet Take 2 tablets by mouth daily.  0  . LORazepam (ATIVAN) 0.5 MG tablet Take 1 tablet (0.5 mg total) by mouth every 6 (six) hours as needed. 30 tablet 0  . metFORMIN (GLUCOPHAGE) 500 MG tablet Take 500 mg by mouth 2 (two) times daily with a meal.    . ondansetron (ZOFRAN) 8 MG tablet Take 1 tablet (8 mg total) by mouth every 12 (twelve) hours as needed. 30 tablet 2  . oxyCODONE (OXY IR/ROXICODONE) 5 MG immediate release tablet Take 1 tablet (5 mg total) by mouth every 6 (six) hours as needed. 120 tablet 0  . pantoprazole (PROTONIX) 40 MG tablet Take 40 mg by mouth daily.    . polyethylene glycol powder (GLYCOLAX/MIRALAX) powder     . prochlorperazine (COMPAZINE) 10 MG tablet Take 10 mg by mouth every 6 (six) hours as needed.    . senna (SENOKOT) 8.6 MG TABS tablet Take 2 tablets (17.2 mg total) by mouth daily. Hold for loose stools. 120 each 0  . Sodium Chloride Flush (NORMAL SALINE FLUSH) 0.9 % SOLN 1 mL by Other route 2 (two) times daily. Flush each biliary drain with 1 mL twice daily  0  . traMADol (ULTRAM) 50 MG tablet Take 50 mg by mouth 2 (two) times daily.     Marland Kitchen venlafaxine XR (EFFEXOR-XR) 75 MG 24 hr capsule Take 225 mg by mouth daily.     Marland Kitchen zolpidem (AMBIEN) 10 MG tablet Take 1 tablet (10 mg total) by mouth at bedtime as needed. 30 tablet 3   No current facility-administered medications for this visit.    Facility-Administered Medications Ordered in Other Visits  Medication Dose Route Frequency Provider Last Rate Last Dose  . acetaminophen (TYLENOL) tablet 650 mg  650 mg Oral Once Earlie Server, MD      . diphenhydrAMINE (BENADRYL) capsule 25 mg  25 mg Oral  Once Earlie Server, MD          .  PHYSICAL EXAMINATION: ECOG PERFORMANCE STATUS: 1 - Symptomatic but completely ambulatory Vitals:   02/02/17 0946  BP: 126/68  Pulse: (!) 105  Temp: 97.9 F (36.6 C)   Filed Weights   02/02/17 0946  Weight: 153 lb (69.4 kg)    GENERAL: No distress, well nourished. pallor SKIN:  No rashes or significant lesions  HEAD: Normocephalic, No masses, lesions, tenderness or abnormalities  EYES: Conjunctiva are pale, non icteric ENT: External ears  normal ,lips , buccal mucosa, and tongue normal and mucous membranes are moist  LYMPH: No palpable cervical and axillary lymphadenopathy  LUNGS: Clear to auscultation, no crackles or wheezes HEART: Regular rate & rhythm, no murmurs, no gallops, S1 normal and S2 normal  ABDOMEN: Abdomen soft, non-tender, normal bowel sounds, bdomen soft, non-tender and normal bowel sounds. 2 PBD tubes with sutures.  MUSCULOSKELETAL: No CVA tenderness and no tenderness on percussion of the back or rib cage.  EXTREMITIES: No edema, no skin discoloration or tenderness NEURO: Alert & oriented, no focal motor/sensory deficits.  LABORATORY DATA:  I have reviewed the data as listed Lab Results  Component Value Date   WBC 19.7 (H) 02/02/2017   HGB 7.7 (L) 02/02/2017   HCT 23.4 (L) 02/02/2017   MCV 96.1 02/02/2017   PLT 14 (LL) 02/02/2017    Recent Labs  01/19/17 0844 01/26/17 0841 02/02/17 0932  NA 134* 134* 136  K 4.1 4.0 3.5  CL 98* 97* 99*  CO2 28 26 28   GLUCOSE 197* 147* 186*  BUN 20 17 20   CREATININE 1.05* 0.54 0.53  CALCIUM 8.7* 8.8* 8.6*  GFRNONAA 51* >60 >60  GFRAA 59* >60 >60  PROT 6.5 6.2* 6.1*  ALBUMIN 3.0* 3.1* 3.2*  AST 95* 77* 63*  ALT 75* 110* 83*  ALKPHOS 477* 444* 457*  BILITOT 0.7 0.5 0.6   [facility] CA 19-9   09/14/16 220 --> 09/28/16 176--> 10/12/16 167-->12/07/2016 64-->12/14/2016 60. --> 12/29/2016 87--> 01/26/2017 99  RADIOGRAPHIC STUDIES: I have personally reviewed the radiological  images as listed and agreed with the findings in the report. CT 12/14/2016 [facility] Impression: 1. Slight interval decrease in size of the left hepatic lobe cholangiocarcinoma. 2. Slight interval increase in size of a porta hepatis lymph node. Other nodes are slightly smaller. 3. Multiple bilateral thyroid nodules. Recommend ultrasound for further evaluation. 4. Stable mediastinal lymph nodes.  ASSESSMENT & PLAN:  Cancer Staging Cholangiocarcinoma [facility]) Staging form: Intrahepatic Bile Duct, AJCC 8th Edition - Clinical stage from 12/29/2016: Stage IV (cT1, cNX, pM1) - Signed by Earlie Server, MD on 12/29/2016  1. Symptomatic anemia   2. Thrombocytopenia ([place])   3. Cholangiocarcinoma ([place])   4. Neoplastic malignant related fatigue   5. Constipation due to opioid therapy   6. Systemic lupus erythematosus, unspecified SLE type, unspecified organ involvement status ([place])   7. Goals of care, counseling/discussion    # s/p Cycle 5 Gemcitabine +/- Cisplatin. Tumor marker slightly increased.     She feels profoundly weak since Cisplatin was added onto her regimen and requested to be off Cisplatin. Since she does not tolerate Cisplatin, I will take it off her regimen. Will continue on Single Agent Gemcitabine at next cycle. Plan repeat CT scan after this cycle 6.   # symptomatic anemia/ Fatigue: could be a combination of chemotherapy and tapering of dexamethasone, and symptomatic anemia.. Continue 18m Dex daily.  Type and screen, plan transfuse 2 units of irradiated PRBCs.. Will add on iron TIBC, ferritin to today's lab.  # Thrombocytopenia secondary to chemotherapy. Easy bruising: transfuse 1 unit of platelet today.   # Constipation due to Opiod: controlled. continue Sennakot 2 tabs daily. Marilax PRN. She has daily BM.  # Skin rash/lupus, follows up with Rhematology. She is on Plaquenil and Dexamethasone 442mdaily [facility] 57[facility]68[facility], will keep her on 57mg19mor now. # Referred to Palliative care medicine for symptom control. Patient tells  me that there will be palliative nurse visit this week.   All questions were answered. The patient knows to call the clinic with any problems questions or concerns.  Return of visit: 1 week for cycle 6 gemcitabine.  She will see Dr.Bramanday who covers me on 10/24 prior to cycle 6 Day 1 and 02/16/2017 day 8 treatment. CT will be scheduled around 11/7, I will see her on Day 1 of Cycle 6 Gemcitabine   Earlie Server, MD, PhD Hematology Oncology [facility] Pager- [phone] 02/02/2017

## 2017-02-02 NOTE — Progress Notes (Signed)
Patient here today for follow up.   

## 2017-02-03 ENCOUNTER — Inpatient Hospital Stay: Payer: PPO

## 2017-02-03 DIAGNOSIS — D649 Anemia, unspecified: Secondary | ICD-10-CM

## 2017-02-03 DIAGNOSIS — Z5111 Encounter for antineoplastic chemotherapy: Secondary | ICD-10-CM | POA: Diagnosis not present

## 2017-02-03 LAB — PREPARE PLATELET PHERESIS: Unit division: 0

## 2017-02-03 LAB — BPAM PLATELET PHERESIS
Blood Product Expiration Date: 201810192359
ISSUE DATE / TIME: 201810171353
UNIT TYPE AND RH: 6200

## 2017-02-03 LAB — PREPARE RBC (CROSSMATCH)

## 2017-02-03 MED ORDER — DIPHENHYDRAMINE HCL 25 MG PO CAPS
25.0000 mg | ORAL_CAPSULE | Freq: Once | ORAL | Status: AC
  Administered 2017-02-03: 25 mg via ORAL
  Filled 2017-02-03: qty 1

## 2017-02-03 MED ORDER — HEPARIN SOD (PORK) LOCK FLUSH 100 UNIT/ML IV SOLN
INTRAVENOUS | Status: AC
  Filled 2017-02-03: qty 5

## 2017-02-03 MED ORDER — ACETAMINOPHEN 325 MG PO TABS
650.0000 mg | ORAL_TABLET | Freq: Once | ORAL | Status: AC
  Administered 2017-02-03: 650 mg via ORAL
  Filled 2017-02-03: qty 2

## 2017-02-03 MED ORDER — SODIUM CHLORIDE 0.9 % IV SOLN
250.0000 mL | Freq: Once | INTRAVENOUS | Status: AC
  Administered 2017-02-03: 250 mL via INTRAVENOUS
  Filled 2017-02-03: qty 250

## 2017-02-06 LAB — TYPE AND SCREEN
ABO/RH(D): O POS
Antibody Screen: POSITIVE
DONOR AG TYPE: NEGATIVE
DONOR AG TYPE: NEGATIVE
UNIT DIVISION: 0
Unit division: 0

## 2017-02-06 LAB — BPAM RBC
BLOOD PRODUCT EXPIRATION DATE: 201811112359
BLOOD PRODUCT EXPIRATION DATE: 201811122359
ISSUE DATE / TIME: 201810181103
ISSUE DATE / TIME: 201810181258
UNIT TYPE AND RH: 5100
Unit Type and Rh: 5100

## 2017-02-07 ENCOUNTER — Other Ambulatory Visit: Payer: Self-pay | Admitting: *Deleted

## 2017-02-07 MED ORDER — DEXAMETHASONE 1 MG PO TABS: 3.0000 mg | ORAL_TABLET | Freq: Every day | ORAL | 0 refills | Status: DC

## 2017-02-08 DIAGNOSIS — H538 Other visual disturbances: Secondary | ICD-10-CM | POA: Diagnosis not present

## 2017-02-08 DIAGNOSIS — R5383 Other fatigue: Secondary | ICD-10-CM | POA: Diagnosis not present

## 2017-02-08 DIAGNOSIS — C220 Liver cell carcinoma: Secondary | ICD-10-CM | POA: Diagnosis not present

## 2017-02-08 DIAGNOSIS — Z515 Encounter for palliative care: Secondary | ICD-10-CM | POA: Diagnosis not present

## 2017-02-09 ENCOUNTER — Encounter: Payer: Self-pay | Admitting: Internal Medicine

## 2017-02-09 ENCOUNTER — Inpatient Hospital Stay: Payer: PPO

## 2017-02-09 ENCOUNTER — Inpatient Hospital Stay (HOSPITAL_BASED_OUTPATIENT_CLINIC_OR_DEPARTMENT_OTHER): Payer: PPO | Admitting: Internal Medicine

## 2017-02-09 VITALS — BP 163/96 | HR 108 | Temp 97.8°F | Wt 156.0 lb

## 2017-02-09 DIAGNOSIS — D649 Anemia, unspecified: Secondary | ICD-10-CM | POA: Diagnosis not present

## 2017-02-09 DIAGNOSIS — E860 Dehydration: Secondary | ICD-10-CM | POA: Diagnosis not present

## 2017-02-09 DIAGNOSIS — E042 Nontoxic multinodular goiter: Secondary | ICD-10-CM

## 2017-02-09 DIAGNOSIS — Z8619 Personal history of other infectious and parasitic diseases: Secondary | ICD-10-CM

## 2017-02-09 DIAGNOSIS — C221 Intrahepatic bile duct carcinoma: Secondary | ICD-10-CM

## 2017-02-09 DIAGNOSIS — M329 Systemic lupus erythematosus, unspecified: Secondary | ICD-10-CM

## 2017-02-09 DIAGNOSIS — Z79899 Other long term (current) drug therapy: Secondary | ICD-10-CM

## 2017-02-09 DIAGNOSIS — Z8719 Personal history of other diseases of the digestive system: Secondary | ICD-10-CM

## 2017-02-09 DIAGNOSIS — I10 Essential (primary) hypertension: Secondary | ICD-10-CM

## 2017-02-09 DIAGNOSIS — J449 Chronic obstructive pulmonary disease, unspecified: Secondary | ICD-10-CM | POA: Diagnosis not present

## 2017-02-09 DIAGNOSIS — K5903 Drug induced constipation: Secondary | ICD-10-CM | POA: Diagnosis not present

## 2017-02-09 DIAGNOSIS — Z5111 Encounter for antineoplastic chemotherapy: Secondary | ICD-10-CM | POA: Diagnosis not present

## 2017-02-09 DIAGNOSIS — Z803 Family history of malignant neoplasm of breast: Secondary | ICD-10-CM

## 2017-02-09 DIAGNOSIS — M5136 Other intervertebral disc degeneration, lumbar region: Secondary | ICD-10-CM

## 2017-02-09 DIAGNOSIS — D6959 Other secondary thrombocytopenia: Secondary | ICD-10-CM

## 2017-02-09 DIAGNOSIS — G893 Neoplasm related pain (acute) (chronic): Secondary | ICD-10-CM | POA: Diagnosis not present

## 2017-02-09 DIAGNOSIS — R21 Rash and other nonspecific skin eruption: Secondary | ICD-10-CM | POA: Diagnosis not present

## 2017-02-09 DIAGNOSIS — Z7984 Long term (current) use of oral hypoglycemic drugs: Secondary | ICD-10-CM

## 2017-02-09 DIAGNOSIS — K219 Gastro-esophageal reflux disease without esophagitis: Secondary | ICD-10-CM

## 2017-02-09 DIAGNOSIS — R530 Neoplastic (malignant) related fatigue: Secondary | ICD-10-CM | POA: Diagnosis not present

## 2017-02-09 DIAGNOSIS — Z87891 Personal history of nicotine dependence: Secondary | ICD-10-CM

## 2017-02-09 DIAGNOSIS — T402X5S Adverse effect of other opioids, sequela: Secondary | ICD-10-CM | POA: Diagnosis not present

## 2017-02-09 DIAGNOSIS — E119 Type 2 diabetes mellitus without complications: Secondary | ICD-10-CM

## 2017-02-09 DIAGNOSIS — Z8042 Family history of malignant neoplasm of prostate: Secondary | ICD-10-CM

## 2017-02-09 DIAGNOSIS — F329 Major depressive disorder, single episode, unspecified: Secondary | ICD-10-CM

## 2017-02-09 LAB — CBC WITH DIFFERENTIAL/PLATELET
BASOS PCT: 0 %
Basophils Absolute: 0 10*3/uL (ref 0–0.1)
EOS ABS: 0.2 10*3/uL (ref 0–0.7)
EOS PCT: 1 %
HEMATOCRIT: 29.1 % — AB (ref 35.0–47.0)
Hemoglobin: 9.6 g/dL — ABNORMAL LOW (ref 12.0–16.0)
LYMPHS ABS: 0.9 10*3/uL — AB (ref 1.0–3.6)
Lymphocytes Relative: 4 %
MCH: 31.6 pg (ref 26.0–34.0)
MCHC: 33 g/dL (ref 32.0–36.0)
MCV: 96 fL (ref 80.0–100.0)
MONO ABS: 2 10*3/uL — AB (ref 0.2–0.9)
Monocytes Relative: 9 %
NEUTROS PCT: 86 %
Neutro Abs: 19.1 10*3/uL — ABNORMAL HIGH (ref 1.4–6.5)
PLATELETS: 185 10*3/uL (ref 150–440)
RBC: 3.03 MIL/uL — ABNORMAL LOW (ref 3.80–5.20)
RDW: 22.1 % — AB (ref 11.5–14.5)
WBC: 22.2 10*3/uL — ABNORMAL HIGH (ref 3.6–11.0)

## 2017-02-09 LAB — COMPREHENSIVE METABOLIC PANEL
ALBUMIN: 3 g/dL — AB (ref 3.5–5.0)
ALK PHOS: 420 U/L — AB (ref 38–126)
ALT: 75 U/L — AB (ref 14–54)
AST: 71 U/L — AB (ref 15–41)
Anion gap: 13 (ref 5–15)
BUN: 14 mg/dL (ref 6–20)
CALCIUM: 8.8 mg/dL — AB (ref 8.9–10.3)
CHLORIDE: 98 mmol/L — AB (ref 101–111)
CO2: 24 mmol/L (ref 22–32)
CREATININE: 0.44 mg/dL (ref 0.44–1.00)
GFR calc non Af Amer: >60 mL/min (ref >60)
GLUCOSE: 231 mg/dL — AB (ref 65–99)
Potassium: 3.5 mmol/L (ref 3.5–5.1)
SODIUM: 135 mmol/L (ref 135–145)
Total Bilirubin: 1.1 mg/dL (ref 0.3–1.2)
Total Protein: 6.4 g/dL — ABNORMAL LOW (ref 6.5–8.1)

## 2017-02-09 MED ORDER — SODIUM CHLORIDE 0.9% FLUSH
10.0000 mL | INTRAVENOUS | Status: DC | PRN
  Administered 2017-02-09: 10 mL
  Filled 2017-02-09: qty 10

## 2017-02-09 MED ORDER — SODIUM CHLORIDE 0.9 % IV SOLN: Freq: Once | INTRAVENOUS | Status: DC

## 2017-02-09 MED ORDER — SODIUM CHLORIDE 0.9 % IV SOLN
1400.0000 mg | Freq: Once | INTRAVENOUS | Status: AC
  Administered 2017-02-09: 1400 mg via INTRAVENOUS
  Filled 2017-02-09: qty 26.3

## 2017-02-09 MED ORDER — ONDANSETRON 8 MG PO TBDP
8.0000 mg | ORAL_TABLET | Freq: Once | ORAL | Status: AC
Start: 2017-02-09 — End: 2017-02-09
  Administered 2017-02-09: 8 mg via ORAL
  Filled 2017-02-09: qty 1

## 2017-02-09 MED ORDER — DEXAMETHASONE 1 MG PO TABS: 3.0000 mg | ORAL_TABLET | Freq: Every day | ORAL | 0 refills | Status: DC

## 2017-02-09 MED ORDER — SODIUM CHLORIDE 0.9 % IV SOLN
Freq: Once | INTRAVENOUS | Status: AC
  Administered 2017-02-09: 15:00:00 via INTRAVENOUS
  Filled 2017-02-09: qty 1000

## 2017-02-09 MED ORDER — HEPARIN SOD (PORK) LOCK FLUSH 100 UNIT/ML IV SOLN
500.0000 [IU] | Freq: Once | INTRAVENOUS | Status: AC | PRN
  Administered 2017-02-09: 500 [IU]
  Filled 2017-02-09: qty 5

## 2017-02-09 NOTE — Assessment & Plan Note (Addendum)
#   Cholangiocarcinoma- gemcitabine and cisplatin chemotherapy- patient tolerating chemotherapy well except for severe fatigue/thrombocytopenia [C discussion below]; no obvious clinical signs of progression.  # Today cycle #3 day 1; will discontinue Cisplatin sec to severe fatigue. we will plan to repeat a CT scan after cycle #3. CT scan plan on November 8th.   # Severe thrombocytopenia-14 appx 1 week ago; s/p transfusion- improved.  # severe anemia- s/p 2 units of PRBC transfusion. Hemoglobin improved to 9.  # PN sec to cisplatin- monitor for now.   # Severe fatigue-discontinue cisplatin.  # follow up in 1 week/chemo/labs; 3 weeks/labs/Dr.Yu/chemo; scan prior.

## 2017-02-09 NOTE — Progress Notes (Signed)
[place] OFFICE PROGRESS NOTE  Patient Care Team: Idelle Crouch, MD as PCP - General (Internal Medicine)  Cancer Staging Cholangiocarcinoma [facility]) Staging form: Intrahepatic Bile Duct, AJCC 8th Edition - Clinical stage from 12/29/2016: Stage IV (cT1, cNX, pM1) - Signed by Earlie Server, MD on 12/29/2016    Oncology History   # SEP 2018- CHOLANGIO CA; METASTATIC; Sep 19th- Gem; added cis with second cylce  # Lupus/skin rash  ------------------------------------------------------------------    1. Initially presented in 07/2016 to an outside ED with lower and upper GI bleed.  A. She was found to have new LFTs elevations including AST 340, ALT 379, Alk phos 420 and t bili 0.9.   B. [place] was obtained, which did not visualize the left lobe.  C. She was evaluated by gastroenterology because of her history of UC and underwent colonoscopy, which was unremarkable per path report.  2. 08/20/16, CT noted diffuse atrophy of the left lobe with left intrahepatic biliary ductal dilatation, with 4.1 cm ill-defined low-attenuation mass. This mass was suspicious for malignancy.  3. 08/2016, MRI/MRCP which showed abnormal left hepatic lobe with diffuse biliary dilatation with a shrunken left hepatic lobe and a 2.42.9 cm oval-shaped masslike component displacing the adjacent bile ducts. There is an approximately 1.6 cm segment truncated bile ducts involving common hepatic duct and right and left hepatic although no dilatation of the right hepatic duct system was seen.  4. 09/02/16, ERCP showed localized biliary stricture in the left intrahepatic duct with dilation of the left intrahepatic branches. Sphincterotomy was performed and a stent was placed. EUS with a 3.3 cm mass and left duct dilation.  5. Repeat ERCP was done 09/10/16, during which the stent was exchanged.  6. 09/20/16, taken to OR and noted to a have an peritoneal implant with biopsy c/w adenocarcinoma. The neoplastic cells are  positive for CK7. They are negative for CK20, CDX-2, TTF-1, Napsin A, hep-par1, glypican 3, and arginase. The immunohistochemical features are consistent with adenocarcinoma of upper gastrointestinal tract or pancreaticobiliary origin.  7. 09/28/16, initially seen in medical oncology clinic by Dr. Filomena Jungling 8. 09/29/16, admitted to 9300 for biliary obstruction and cholangitis, s/p PBD placement, d/c 10/06/16 9. 10/12/16, initiated on gemcitabine (400 mg/m2). CA 19-9 167 10. 10/26/16, increased gemcitabine (800 mg/m2) 11. 12/07/2016, added cisplatin  12. 12/14/2016, CT CAP shows slight interval decrease in size of L hepatic lobe cholangio. Slight interval increase in size of porta hepatis lymph node whiel other nodes are slightly smaller. Multiple bilateral thyroid nodules. Stable mediastinal lymph nodes. CA 19-9 60         Cholangiocarcinoma (HCC)    This is my first interaction with the patient as patient's primary oncologist has been Dr.Yu. I reviewed the patient's prior charts/pertinent labs/imaging in detail; findings are summarized above.     INTERVAL HISTORY:  Regina Hood 76 y.o.  female pleasant patient above history of Cholangiocarcinoma currently on cisplatin and gemcitabine/first-line is here for follow-up.  In the interim patient noted to have severe thrombocytopenia platelet count of 14/1 week ago; received platelet transfusion and also 2 units of PRBC transfusion for hemoglobin 7.7.  Patient admits to mild numbness in her feet. Otherwise no falls. Energy level is improved since PRBC transfusion. She denies any bleeding. She denies any nausea vomiting. She does complain of fairly significant fatigue.  REVIEW OF SYSTEMS:  A complete 10 point review of system is done which is negative except mentioned above/history of present illness.  PAST MEDICAL HISTORY :  Past Medical History:  Diagnosis Date  . Arthritis   . Cancer ([place])   . Cellulitis   . COPD (chronic obstructive  pulmonary disease) ([place])   . DDD (degenerative disc disease), lumbar   . Depression   . Diabetes mellitus   . Diverticulitis   . GERD (gastroesophageal reflux disease)   . GI bleed   . Headache(784.0)   . Hypertension   . Lupus   . Ulcerative colitis ([place])     PAST SURGICAL HISTORY :   Past Surgical History:  Procedure Laterality Date  . ABDOMINAL HYSTERECTOMY    . BREAST BIOPSY Right   . COLONOSCOPY    . COLONOSCOPY WITH PROPOFOL N/A 08/18/2016   Procedure: COLONOSCOPY WITH PROPOFOL;  Surgeon: Manya Silvas, MD;  Location[facility] ENDOSCOPY;  Service: Endoscopy;  Laterality: N/A;  . ESOPHAGOGASTRODUODENOSCOPY (EGD) WITH PROPOFOL N/A 08/18/2016   Procedure: ESOPHAGOGASTRODUODENOSCOPY (EGD) WITH PROPOFOL;  Surgeon: Manya Silvas, MD;  Location: [facility] ENDOSCOPY;  Service: Endoscopy;  Laterality: N/A;  . ORIF HUMERUS FRACTURE  09/17/2011   Procedure: OPEN REDUCTION INTERNAL FIXATION (ORIF) PROXIMAL HUMERUS FRACTURE;  Surgeon: Augustin Schooling, MD;  Location: [place];  Service: Orthopedics;  Laterality: Left;    FAMILY HISTORY :   Family History  Problem Relation Age of Onset  . Breast cancer Mother 26  . COPD Father   . Prostate cancer Brother     SOCIAL HISTORY:   Social History  Substance Use Topics  . Smoking status: Former Smoker    Packs/day: 1.00    Years: 10.00    Types: Cigarettes    Quit date: 12/30/1998  . Smokeless tobacco: Never Used     Comment: quit smoking in 2003  . Alcohol use No    ALLERGIES:  is allergic to feldene [piroxicam]; penicillamine; and qualaquin [quinine].  MEDICATIONS:  Current Outpatient Prescriptions  Medication Sig Dispense Refill  . acetaminophen (TYLENOL) 500 MG tablet Take 1,000 mg by mouth 3 (three) times daily.    Marland Kitchen amLODipine (NORVASC) 5 MG tablet Take 5 mg by mouth daily.    Marland Kitchen azelastine (ASTELIN) 0.1 % nasal spray Place into the nose.    . calcium carbonate 100 mg/ml SUSP Take by mouth.    . clobetasol ointment (TEMOVATE) 0.05 %  Apply topically.    . Cyanocobalamin (VITAMIN B-12 PO) Take 1,000 mcg by mouth daily.    Marland Kitchen dexamethasone (DECADRON) 1 MG tablet Take 3 tablets (3 mg total) by mouth daily. 90 tablet 0  . estradiol (ESTRACE) 1 MG tablet Take 1 mg by mouth daily.    Marland Kitchen gabapentin (NEURONTIN) 100 MG capsule Take 100 mg by mouth 3 (three) times daily.     . hydroxychloroquine (PLAQUENIL) 200 MG tablet Take by mouth.    . metFORMIN (GLUCOPHAGE) 500 MG tablet Take 500 mg by mouth 2 (two) times daily with a meal.    . ondansetron (ZOFRAN) 8 MG tablet Take 1 tablet (8 mg total) by mouth every 12 (twelve) hours as needed. 30 tablet 2  . oxyCODONE (OXY IR/ROXICODONE) 5 MG immediate release tablet Take 1 tablet (5 mg total) by mouth every 6 (six) hours as needed. 120 tablet 0  . pantoprazole (PROTONIX) 40 MG tablet Take 40 mg by mouth daily.    . polyethylene glycol powder (GLYCOLAX/MIRALAX) powder     . prochlorperazine (COMPAZINE) 10 MG tablet Take 10 mg by mouth every 6 (six) hours as needed.    . senna (  SENOKOT) 8.6 MG TABS tablet Take 2 tablets (17.2 mg total) by mouth daily. Hold for loose stools. 120 each 0  . Sodium Chloride Flush (NORMAL SALINE FLUSH) 0.9 % SOLN 1 mL by Other route 2 (two) times daily. Flush each biliary drain with 1 mL twice daily  0  . traMADol (ULTRAM) 50 MG tablet Take 50 mg by mouth 2 (two) times daily.     Marland Kitchen venlafaxine XR (EFFEXOR-XR) 75 MG 24 hr capsule Take 225 mg by mouth daily.     Marland Kitchen zolpidem (AMBIEN) 10 MG tablet Take 1 tablet (10 mg total) by mouth at bedtime as needed. 30 tablet 3   No current facility-administered medications for this visit.    Facility-Administered Medications Ordered in Other Visits  Medication Dose Route Frequency Provider Last Rate Last Dose  . sodium chloride flush (NS) 0.9 % injection 10 mL  10 mL Intracatheter PRN Cammie Sickle, MD   10 mL at 02/09/17 1310    PHYSICAL EXAMINATION: ECOG PERFORMANCE STATUS: 1 - Symptomatic but completely  ambulatory  BP (!) 163/96 (BP Location: Right Arm, Patient Position: Sitting)   Pulse (!) 108   Temp 97.8 F (36.6 C) (Tympanic)   Wt 156 lb (70.8 kg)   BMI 28.30 kg/m   Filed Weights   02/09/17 1350  Weight: 156 lb (70.8 kg)    GENERAL: Well-nourished well-developed; Alert, no distress and comfortable.   Alone. EYES: no pallor or icterus OROPHARYNX: no thrush or ulceration; good dentition  NECK: supple, no masses felt LYMPH:  no palpable lymphadenopathy in the cervical, axillary or inguinal regions LUNGS: clear to auscultation and  No wheeze or crackles HEART/CVS: regular rate & rhythm and no murmurs; No lower extremity edema ABDOMEN:abdomen soft, non-tender and normal bowel sounds Musculoskeletal:no cyanosis of digits and no clubbing  PSYCH: alert & oriented x 3 with fluent speech NEURO: no focal motor/sensory deficits SKIN:  Erythematous rash on the chest/chronic as per patient [secondary to lupus]  LABORATORY DATA:  I have reviewed the data as listed    Component Value Date/Time   NA 135 02/09/2017 1310   K 3.5 02/09/2017 1310   CL 98 (L) 02/09/2017 1310   CO2 24 02/09/2017 1310   GLUCOSE 231 (H) 02/09/2017 1310   BUN 14 02/09/2017 1310   CREATININE 0.44 02/09/2017 1310   CALCIUM 8.8 (L) 02/09/2017 1310   PROT 6.4 (L) 02/09/2017 1310   ALBUMIN 3.0 (L) 02/09/2017 1310   AST 71 (H) 02/09/2017 1310   ALT 75 (H) 02/09/2017 1310   ALKPHOS 420 (H) 02/09/2017 1310   BILITOT 1.1 02/09/2017 1310   GFRNONAA >60 02/09/2017 1310   GFRAA >60 02/09/2017 1310    No results found for: SPEP, UPEP  Lab Results  Component Value Date   WBC 22.2 (H) 02/09/2017   NEUTROABS 19.1 (H) 02/09/2017   HGB 9.6 (L) 02/09/2017   HCT 29.1 (L) 02/09/2017   MCV 96.0 02/09/2017   PLT 185 02/09/2017      Chemistry      Component Value Date/Time   NA 135 02/09/2017 1310   K 3.5 02/09/2017 1310   CL 98 (L) 02/09/2017 1310   CO2 24 02/09/2017 1310   BUN 14 02/09/2017 1310    CREATININE 0.44 02/09/2017 1310      Component Value Date/Time   CALCIUM 8.8 (L) 02/09/2017 1310   ALKPHOS 420 (H) 02/09/2017 1310   AST 71 (H) 02/09/2017 1310   ALT 75 (H) 02/09/2017 1310  BILITOT 1.1 02/09/2017 1310       RADIOGRAPHIC STUDIES: I have personally reviewed the radiological images as listed and agreed with the findings in the report. No results found.   ASSESSMENT & PLAN:  Cholangiocarcinoma ([place]) # Cholangiocarcinoma- gemcitabine and cisplatin chemotherapy- patient tolerating chemotherapy well except for severe fatigue/thrombocytopenia [C discussion below]; no obvious clinical signs of progression.  # Today cycle #3 day 1; will discontinue Cisplatin sec to severe fatigue. we will plan to repeat a CT scan after cycle #3. CT scan plan on November 8th.   # Severe thrombocytopenia-14 appx 1 week ago; s/p transfusion- improved.  # severe anemia- s/p 2 units of PRBC transfusion. Hemoglobin improved to 9.  # PN sec to cisplatin- monitor for now.   # Severe fatigue-discontinue cisplatin.  # follow up in 1 week/chemo/labs; 3 weeks/labs/Dr.Yu/chemo; scan prior.   No orders of the defined types were placed in this encounter.  All questions were answered. The patient knows to call the clinic with any problems, questions or concerns.      Cammie Sickle, MD 02/09/2017 4:58 PM

## 2017-02-09 NOTE — Progress Notes (Signed)
Patient is here for follow up with labs. She reports being tired but denies having any pain. She needs a refill of her dexamethasone today, as she has taken the last of her pills.

## 2017-02-15 DIAGNOSIS — E119 Type 2 diabetes mellitus without complications: Secondary | ICD-10-CM | POA: Diagnosis not present

## 2017-02-15 DIAGNOSIS — B351 Tinea unguium: Secondary | ICD-10-CM | POA: Diagnosis not present

## 2017-02-16 ENCOUNTER — Inpatient Hospital Stay: Payer: PPO

## 2017-02-16 ENCOUNTER — Inpatient Hospital Stay (HOSPITAL_BASED_OUTPATIENT_CLINIC_OR_DEPARTMENT_OTHER): Payer: PPO | Admitting: Internal Medicine

## 2017-02-16 VITALS — BP 165/91 | HR 109 | Temp 97.5°F | Resp 20 | Ht 62.5 in | Wt 156.0 lb

## 2017-02-16 DIAGNOSIS — G629 Polyneuropathy, unspecified: Secondary | ICD-10-CM | POA: Diagnosis not present

## 2017-02-16 DIAGNOSIS — R530 Neoplastic (malignant) related fatigue: Secondary | ICD-10-CM

## 2017-02-16 DIAGNOSIS — T451X5A Adverse effect of antineoplastic and immunosuppressive drugs, initial encounter: Secondary | ICD-10-CM

## 2017-02-16 DIAGNOSIS — E1165 Type 2 diabetes mellitus with hyperglycemia: Secondary | ICD-10-CM

## 2017-02-16 DIAGNOSIS — Z8619 Personal history of other infectious and parasitic diseases: Secondary | ICD-10-CM

## 2017-02-16 DIAGNOSIS — G4700 Insomnia, unspecified: Secondary | ICD-10-CM

## 2017-02-16 DIAGNOSIS — G893 Neoplasm related pain (acute) (chronic): Secondary | ICD-10-CM | POA: Diagnosis not present

## 2017-02-16 DIAGNOSIS — M5136 Other intervertebral disc degeneration, lumbar region: Secondary | ICD-10-CM | POA: Diagnosis not present

## 2017-02-16 DIAGNOSIS — C221 Intrahepatic bile duct carcinoma: Secondary | ICD-10-CM

## 2017-02-16 DIAGNOSIS — E042 Nontoxic multinodular goiter: Secondary | ICD-10-CM

## 2017-02-16 DIAGNOSIS — Z8719 Personal history of other diseases of the digestive system: Secondary | ICD-10-CM

## 2017-02-16 DIAGNOSIS — Z8042 Family history of malignant neoplasm of prostate: Secondary | ICD-10-CM

## 2017-02-16 DIAGNOSIS — D649 Anemia, unspecified: Secondary | ICD-10-CM

## 2017-02-16 DIAGNOSIS — Z5111 Encounter for antineoplastic chemotherapy: Secondary | ICD-10-CM | POA: Diagnosis not present

## 2017-02-16 DIAGNOSIS — R5383 Other fatigue: Secondary | ICD-10-CM | POA: Diagnosis not present

## 2017-02-16 DIAGNOSIS — J449 Chronic obstructive pulmonary disease, unspecified: Secondary | ICD-10-CM

## 2017-02-16 DIAGNOSIS — M329 Systemic lupus erythematosus, unspecified: Secondary | ICD-10-CM

## 2017-02-16 DIAGNOSIS — Z79899 Other long term (current) drug therapy: Secondary | ICD-10-CM

## 2017-02-16 DIAGNOSIS — Z7984 Long term (current) use of oral hypoglycemic drugs: Secondary | ICD-10-CM

## 2017-02-16 DIAGNOSIS — E119 Type 2 diabetes mellitus without complications: Secondary | ICD-10-CM | POA: Diagnosis not present

## 2017-02-16 DIAGNOSIS — Z95828 Presence of other vascular implants and grafts: Secondary | ICD-10-CM

## 2017-02-16 DIAGNOSIS — F329 Major depressive disorder, single episode, unspecified: Secondary | ICD-10-CM | POA: Diagnosis not present

## 2017-02-16 DIAGNOSIS — K219 Gastro-esophageal reflux disease without esophagitis: Secondary | ICD-10-CM

## 2017-02-16 DIAGNOSIS — Z87891 Personal history of nicotine dependence: Secondary | ICD-10-CM

## 2017-02-16 DIAGNOSIS — R112 Nausea with vomiting, unspecified: Secondary | ICD-10-CM

## 2017-02-16 DIAGNOSIS — I10 Essential (primary) hypertension: Secondary | ICD-10-CM

## 2017-02-16 DIAGNOSIS — Z803 Family history of malignant neoplasm of breast: Secondary | ICD-10-CM

## 2017-02-16 LAB — CBC WITH DIFFERENTIAL/PLATELET
BASOS ABS: 0 10*3/uL (ref 0–0.1)
Basophils Relative: 1 %
EOS PCT: 0 %
Eosinophils Absolute: 0 10*3/uL (ref 0–0.7)
HCT: 24.9 % — ABNORMAL LOW (ref 35.0–47.0)
HEMOGLOBIN: 8.4 g/dL — AB (ref 12.0–16.0)
LYMPHS ABS: 0.4 10*3/uL — AB (ref 1.0–3.6)
LYMPHS PCT: 7 %
MCH: 32.6 pg (ref 26.0–34.0)
MCHC: 33.6 g/dL (ref 32.0–36.0)
MCV: 97 fL (ref 80.0–100.0)
Monocytes Absolute: 0.9 10*3/uL (ref 0.2–0.9)
Monocytes Relative: 18 %
NEUTROS PCT: 74 %
Neutro Abs: 3.7 10*3/uL (ref 1.4–6.5)
PLATELETS: 123 10*3/uL — AB (ref 150–440)
RBC: 2.57 MIL/uL — AB (ref 3.80–5.20)
RDW: 21.2 % — ABNORMAL HIGH (ref 11.5–14.5)
WBC: 5.1 10*3/uL (ref 3.6–11.0)

## 2017-02-16 LAB — COMPREHENSIVE METABOLIC PANEL
ALT: 77 U/L — AB (ref 14–54)
AST: 72 U/L — AB (ref 15–41)
Albumin: 3 g/dL — ABNORMAL LOW (ref 3.5–5.0)
Alkaline Phosphatase: 361 U/L — ABNORMAL HIGH (ref 38–126)
Anion gap: 8 (ref 5–15)
BUN: 12 mg/dL (ref 6–20)
CALCIUM: 8.5 mg/dL — AB (ref 8.9–10.3)
CHLORIDE: 100 mmol/L — AB (ref 101–111)
CO2: 29 mmol/L (ref 22–32)
CREATININE: 0.62 mg/dL (ref 0.44–1.00)
GFR calc Af Amer: >60 mL/min (ref >60)
Glucose, Bld: 242 mg/dL — ABNORMAL HIGH (ref 65–99)
Potassium: 3.4 mmol/L — ABNORMAL LOW (ref 3.5–5.1)
SODIUM: 137 mmol/L (ref 135–145)
Total Bilirubin: 0.7 mg/dL (ref 0.3–1.2)
Total Protein: 6 g/dL — ABNORMAL LOW (ref 6.5–8.1)

## 2017-02-16 MED ORDER — HEPARIN SOD (PORK) LOCK FLUSH 100 UNIT/ML IV SOLN
500.0000 [IU] | Freq: Once | INTRAVENOUS | Status: AC | PRN
  Administered 2017-02-16: 500 [IU]
  Filled 2017-02-16: qty 5

## 2017-02-16 MED ORDER — NORMAL SALINE FLUSH 0.9 % IV SOLN: 1.0000 mL | Freq: Three times a day (TID) | INTRAVENOUS | 3 refills | Status: DC

## 2017-02-16 MED ORDER — PALONOSETRON HCL INJECTION 0.25 MG/5ML
0.2500 mg | Freq: Once | INTRAVENOUS | Status: AC
  Administered 2017-02-16: 0.25 mg via INTRAVENOUS

## 2017-02-16 MED ORDER — DEXAMETHASONE 1 MG PO TABS: 3.0000 mg | ORAL_TABLET | Freq: Every day | ORAL | 3 refills | Status: DC

## 2017-02-16 MED ORDER — LIDOCAINE-PRILOCAINE 2.5-2.5 % EX CREA: 1.0000 "application " | TOPICAL_CREAM | CUTANEOUS | 3 refills | Status: AC | PRN

## 2017-02-16 MED ORDER — SODIUM CHLORIDE 0.9% FLUSH
10.0000 mL | INTRAVENOUS | Status: DC | PRN
  Administered 2017-02-16: 10 mL
  Filled 2017-02-16: qty 10

## 2017-02-16 MED ORDER — SODIUM CHLORIDE 0.9 % IV SOLN
Freq: Once | INTRAVENOUS | Status: AC
  Administered 2017-02-16: 15:00:00 via INTRAVENOUS
  Filled 2017-02-16: qty 1000

## 2017-02-16 MED ORDER — ONDANSETRON HCL 8 MG PO TABS: 8.0000 mg | ORAL_TABLET | Freq: Two times a day (BID) | ORAL | 3 refills | Status: DC | PRN

## 2017-02-16 MED ORDER — SODIUM CHLORIDE 0.9 % IV SOLN
1400.0000 mg | Freq: Once | INTRAVENOUS | Status: AC
  Administered 2017-02-16: 1400 mg via INTRAVENOUS
  Filled 2017-02-16: qty 26.3

## 2017-02-16 MED ORDER — SENNA 8.6 MG PO TABS: 2.0000 | ORAL_TABLET | Freq: Every day | ORAL | 3 refills | Status: DC

## 2017-02-16 MED ORDER — ZOLPIDEM TARTRATE 10 MG PO TABS: 10.0000 mg | ORAL_TABLET | Freq: Every evening | ORAL | 3 refills | Status: DC | PRN

## 2017-02-16 MED ORDER — PROCHLORPERAZINE MALEATE 10 MG PO TABS: 10.0000 mg | ORAL_TABLET | Freq: Four times a day (QID) | ORAL | 3 refills | Status: DC | PRN

## 2017-02-16 NOTE — Progress Notes (Signed)
[place] OFFICE PROGRESS NOTE  Patient Care Team: Idelle Crouch, MD as PCP - General (Internal Medicine)  Cancer Staging Cholangiocarcinoma [facility]) Staging form: Intrahepatic Bile Duct, AJCC 8th Edition - Clinical stage from 12/29/2016: Stage IV (cT1, cNX, pM1) - Signed by Earlie Server, MD on 12/29/2016    Oncology History   # SEP 2018- CHOLANGIO CA; METASTATIC; Sep 19th- Gem; added cis with second cylce  # Lupus/skin rash  ------------------------------------------------------------------    1. Initially presented in 07/2016 to an outside ED with lower and upper GI bleed.  A. She was found to have new LFTs elevations including AST 340, ALT 379, Alk phos 420 and t bili 0.9.   B. [place] was obtained, which did not visualize the left lobe.  C. She was evaluated by gastroenterology because of her history of UC and underwent colonoscopy, which was unremarkable per path report.  2. 08/20/16, CT noted diffuse atrophy of the left lobe with left intrahepatic biliary ductal dilatation, with 4.1 cm ill-defined low-attenuation mass. This mass was suspicious for malignancy.  3. 08/2016, MRI/MRCP which showed abnormal left hepatic lobe with diffuse biliary dilatation with a shrunken left hepatic lobe and a 2.42.9 cm oval-shaped masslike component displacing the adjacent bile ducts. There is an approximately 1.6 cm segment truncated bile ducts involving common hepatic duct and right and left hepatic although no dilatation of the right hepatic duct system was seen.  4. 09/02/16, ERCP showed localized biliary stricture in the left intrahepatic duct with dilation of the left intrahepatic branches. Sphincterotomy was performed and a stent was placed. EUS with a 3.3 cm mass and left duct dilation.  5. Repeat ERCP was done 09/10/16, during which the stent was exchanged.  6. 09/20/16, taken to OR and noted to a have an peritoneal implant with biopsy c/w adenocarcinoma. The neoplastic cells are  positive for CK7. They are negative for CK20, CDX-2, TTF-1, Napsin A, hep-par1, glypican 3, and arginase. The immunohistochemical features are consistent with adenocarcinoma of upper gastrointestinal tract or pancreaticobiliary origin.  7. 09/28/16, initially seen in medical oncology clinic by Dr. Filomena Jungling 8. 09/29/16, admitted to 9300 for biliary obstruction and cholangitis, s/p PBD placement, d/c 10/06/16 9. 10/12/16, initiated on gemcitabine (400 mg/m2). CA 19-9 167 10. 10/26/16, increased gemcitabine (800 mg/m2) 11. 12/07/2016, added cisplatin  12. 12/14/2016, CT CAP shows slight interval decrease in size of L hepatic lobe cholangio. Slight interval increase in size of porta hepatis lymph node whiel other nodes are slightly smaller. Multiple bilateral thyroid nodules. Stable mediastinal lymph nodes. CA 19-9 60         Cholangiocarcinoma (HCC)      INTERVAL HISTORY:  QUINESHA SELINGER 76 y.o.  female pleasant patient above history of Cholangiocarcinoma currently on cisplatin and gemcitabine/first-line is here for follow-up. Cisplatin is currently on hold-because of worsening fatigue.  Patient received gemcitabine alone last cycle; however no significant improvement of fatigue.    She denies any easy bruising. Denies any gum bleeding or nose bleeds. Complains of mild numbness in the feet. She denies any nausea vomiting.   REVIEW OF SYSTEMS:  A complete 10 point review of system is done which is negative except mentioned above/history of present illness.   PAST MEDICAL HISTORY :  Past Medical History:  Diagnosis Date  . Arthritis   . Cancer ([place])   . Cellulitis   . COPD (chronic obstructive pulmonary disease) ([place])   . DDD (degenerative disc disease), lumbar   . Depression   .  Diabetes mellitus   . Diverticulitis   . GERD (gastroesophageal reflux disease)   . GI bleed   . Headache(784.0)   . Hypertension   . Lupus   . Ulcerative colitis ([place])     PAST SURGICAL HISTORY :    Past Surgical History:  Procedure Laterality Date  . ABDOMINAL HYSTERECTOMY    . BREAST BIOPSY Right   . COLONOSCOPY      FAMILY HISTORY :   Family History  Problem Relation Age of Onset  . Breast cancer Mother 36  . COPD Father   . Prostate cancer Brother     SOCIAL HISTORY:   Social History   Tobacco Use  . Smoking status: Former Smoker    Packs/day: 1.00    Years: 10.00    Pack years: 10.00    Types: Cigarettes    Last attempt to quit: 12/30/1998    Years since quitting: 18.1  . Smokeless tobacco: Never Used  . Tobacco comment: quit smoking in 2003  Substance Use Topics  . Alcohol use: No  . Drug use: No    ALLERGIES:  is allergic to feldene [piroxicam]; penicillamine; and qualaquin [quinine].  MEDICATIONS:  Current Outpatient Medications  Medication Sig Dispense Refill  . acetaminophen (TYLENOL) 500 MG tablet Take 1,000 mg by mouth 3 (three) times daily.    Marland Kitchen amLODipine (NORVASC) 5 MG tablet Take 5 mg by mouth daily.    Marland Kitchen azelastine (ASTELIN) 0.1 % nasal spray Place into the nose.    . calcium carbonate 100 mg/ml SUSP Take by mouth.    . clobetasol ointment (TEMOVATE) 0.05 % Apply topically.    . Cyanocobalamin (VITAMIN B-12 PO) Take 1,000 mcg by mouth daily.    Marland Kitchen dexamethasone (DECADRON) 1 MG tablet Take 3 tablets (3 mg total) by mouth daily. 90 tablet 3  . estradiol (ESTRACE) 1 MG tablet Take 1 mg by mouth daily.    Marland Kitchen gabapentin (NEURONTIN) 100 MG capsule Take 100 mg by mouth 3 (three) times daily.     . hydroxychloroquine (PLAQUENIL) 200 MG tablet Take by mouth.    . metFORMIN (GLUCOPHAGE) 500 MG tablet Take 500 mg by mouth 2 (two) times daily with a meal.    . ondansetron (ZOFRAN) 8 MG tablet Take 1 tablet (8 mg total) by mouth every 12 (twelve) hours as needed. 30 tablet 3  . oxyCODONE (OXY IR/ROXICODONE) 5 MG immediate release tablet Take 1 tablet (5 mg total) by mouth every 6 (six) hours as needed. 120 tablet 0  . pantoprazole (PROTONIX) 40 MG tablet  Take 40 mg by mouth daily.    . polyethylene glycol powder (GLYCOLAX/MIRALAX) powder     . prochlorperazine (COMPAZINE) 10 MG tablet Take 1 tablet (10 mg total) by mouth every 6 (six) hours as needed. 30 tablet 3  . senna (SENOKOT) 8.6 MG TABS tablet Take 2 tablets (17.2 mg total) by mouth daily. Hold for loose stools. 120 each 3  . Sodium Chloride Flush (NORMAL SALINE FLUSH) 0.9 % SOLN 1 mL by Other route every 8 (eight) hours. Flush each biliary drain with 1 mL twice daily 84 Syringe 3  . traMADol (ULTRAM) 50 MG tablet Take 50 mg by mouth 2 (two) times daily.     Marland Kitchen venlafaxine XR (EFFEXOR-XR) 75 MG 24 hr capsule Take 225 mg by mouth daily.     Marland Kitchen zolpidem (AMBIEN) 10 MG tablet Take 1 tablet (10 mg total) by mouth at bedtime as needed. 30 tablet 3  .  lidocaine-prilocaine (EMLA) cream Apply 1 application topically as needed. 30 g 3   No current facility-administered medications for this visit.     PHYSICAL EXAMINATION: ECOG PERFORMANCE STATUS: 1 - Symptomatic but completely ambulatory  BP (!) 165/91   Pulse (!) 109   Temp (!) 97.5 F (36.4 C) (Tympanic)   Resp 20   Ht 5' 2.5" (1.588 m)   Wt 156 lb (70.8 kg)   BMI 28.08 kg/m   Filed Weights   02/16/17 1335  Weight: 156 lb (70.8 kg)    GENERAL: Well-nourished well-developed; Alert, no distress and comfortable.   Alone. EYES: no pallor or icterus OROPHARYNX: no thrush or ulceration; good dentition  NECK: supple, no masses felt LYMPH:  no palpable lymphadenopathy in the cervical, axillary or inguinal regions LUNGS: clear to auscultation and  No wheeze or crackles HEART/CVS: regular rate & rhythm and no murmurs; No lower extremity edema ABDOMEN:abdomen soft, non-tender and normal bowel sounds Musculoskeletal:no cyanosis of digits and no clubbing  PSYCH: alert & oriented x 3 with fluent speech NEURO: no focal motor/sensory deficits SKIN:  Erythematous rash on the chest/chronic as per patient [secondary to lupus]  LABORATORY DATA:   I have reviewed the data as listed    Component Value Date/Time   NA 137 02/16/2017 1313   K 3.4 (L) 02/16/2017 1313   CL 100 (L) 02/16/2017 1313   CO2 29 02/16/2017 1313   GLUCOSE 242 (H) 02/16/2017 1313   BUN 12 02/16/2017 1313   CREATININE 0.62 02/16/2017 1313   CALCIUM 8.5 (L) 02/16/2017 1313   PROT 6.0 (L) 02/16/2017 1313   ALBUMIN 3.0 (L) 02/16/2017 1313   AST 72 (H) 02/16/2017 1313   ALT 77 (H) 02/16/2017 1313   ALKPHOS 361 (H) 02/16/2017 1313   BILITOT 0.7 02/16/2017 1313   GFRNONAA >60 02/16/2017 1313   GFRAA >60 02/16/2017 1313    No results found for: SPEP, UPEP  Lab Results  Component Value Date   WBC 5.1 02/16/2017   NEUTROABS 3.7 02/16/2017   HGB 8.4 (L) 02/16/2017   HCT 24.9 (L) 02/16/2017   MCV 97.0 02/16/2017   PLT 123 (L) 02/16/2017      Chemistry      Component Value Date/Time   NA 137 02/16/2017 1313   K 3.4 (L) 02/16/2017 1313   CL 100 (L) 02/16/2017 1313   CO2 29 02/16/2017 1313   BUN 12 02/16/2017 1313   CREATININE 0.62 02/16/2017 1313      Component Value Date/Time   CALCIUM 8.5 (L) 02/16/2017 1313   ALKPHOS 361 (H) 02/16/2017 1313   AST 72 (H) 02/16/2017 1313   ALT 77 (H) 02/16/2017 1313   BILITOT 0.7 02/16/2017 1313       RADIOGRAPHIC STUDIES: I have personally reviewed the radiological images as listed and agreed with the findings in the report. No results found.   ASSESSMENT & PLAN:  Cholangiocarcinoma ([place]) # Cholangiocarcinoma- gemcitabine and cisplatin chemotherapy- patient tolerating chemotherapy well except for severe fatigue/thrombocytopenia [C discussion below]; no obvious clinical signs of progression.  # Today cycle #3 day 8; will discontinue Cisplatin sec to severe fatigue. Awaiting CT scan after cycle #3. CT scan plan on November 8th. Labs today reviewed;  acceptable for treatment today.   # Elevated Blood sugars-361 ? Causing fatigue. check blood glucose; call PCP- cut down dex to 1 mg/day.   # severe anemia-  s/p 2 units of PRBC transfusion.   # PN sec to cisplatin- monitor for now.   #  Severe fatigue-discontinue cisplatin; not improved.  -keep appts as scheduled.    Orders Placed This Encounter  Procedures  . Cancer antigen 19-9    Standing Status:   Future    Number of Occurrences:   1    Standing Expiration Date:   02/16/2018   All questions were answered. The patient knows to call the clinic with any problems, questions or concerns.      Cammie Sickle, MD 02/20/2017 6:24 PM

## 2017-02-16 NOTE — Patient Instructions (Signed)
Take dexamethasone 1 mg/1 pill once a day.

## 2017-02-16 NOTE — Assessment & Plan Note (Addendum)
#   Cholangiocarcinoma- gemcitabine and cisplatin chemotherapy- patient tolerating chemotherapy well except for severe fatigue/thrombocytopenia [C discussion below]; no obvious clinical signs of progression.  # Today cycle #3 day 8; will discontinue Cisplatin sec to severe fatigue. Awaiting CT scan after cycle #3. CT scan plan on November 8th. Labs today reviewed;  acceptable for treatment today.   # Elevated Blood sugars-361 ? Causing fatigue. check blood glucose; call PCP- cut down dex to 1 mg/day.   # severe anemia- s/p 2 units of PRBC transfusion.   # PN sec to cisplatin- monitor for now.   # Severe fatigue-discontinue cisplatin; not improved.  -keep appts as scheduled.

## 2017-02-16 NOTE — Progress Notes (Signed)
Patient requesting prescription for EMLA cream. Sent message to Geraldine Solar, [place] request for prescription.

## 2017-02-17 LAB — CANCER ANTIGEN 19-9: CA 19-9: 74 U/mL — ABNORMAL HIGH (ref 0–35)

## 2017-02-24 ENCOUNTER — Ambulatory Visit
Admission: RE | Admit: 2017-02-24 | Discharge: 2017-02-24 | Disposition: A | Discharge status: Home / Self Care | Payer: PPO | Source: Ambulatory Visit | Attending: Oncology | Admitting: Oncology

## 2017-02-24 DIAGNOSIS — J8410 Pulmonary fibrosis, unspecified: Secondary | ICD-10-CM | POA: Insufficient documentation

## 2017-02-24 DIAGNOSIS — C221 Intrahepatic bile duct carcinoma: Secondary | ICD-10-CM | POA: Insufficient documentation

## 2017-02-24 DIAGNOSIS — R918 Other nonspecific abnormal finding of lung field: Secondary | ICD-10-CM | POA: Diagnosis not present

## 2017-02-24 DIAGNOSIS — I700 Atherosclerosis of aorta: Secondary | ICD-10-CM | POA: Diagnosis not present

## 2017-02-24 DIAGNOSIS — J449 Chronic obstructive pulmonary disease, unspecified: Secondary | ICD-10-CM | POA: Diagnosis not present

## 2017-02-24 DIAGNOSIS — Z90710 Acquired absence of both cervix and uterus: Secondary | ICD-10-CM | POA: Diagnosis not present

## 2017-02-24 MED ORDER — IOPAMIDOL (ISOVUE-300) INJECTION 61%
100.0000 mL | Freq: Once | INTRAVENOUS | Status: AC | PRN
  Administered 2017-02-24: 100 mL via INTRAVENOUS

## 2017-03-02 ENCOUNTER — Inpatient Hospital Stay: Payer: PPO

## 2017-03-02 ENCOUNTER — Inpatient Hospital Stay: Payer: PPO | Attending: Oncology

## 2017-03-02 ENCOUNTER — Encounter: Payer: Self-pay | Admitting: Oncology

## 2017-03-02 ENCOUNTER — Other Ambulatory Visit: Payer: Self-pay

## 2017-03-02 ENCOUNTER — Inpatient Hospital Stay (HOSPITAL_BASED_OUTPATIENT_CLINIC_OR_DEPARTMENT_OTHER): Payer: PPO | Admitting: Oncology

## 2017-03-02 VITALS — BP 148/86 | HR 108 | Temp 96.8°F | Resp 18 | Wt 159.2 lb

## 2017-03-02 DIAGNOSIS — M329 Systemic lupus erythematosus, unspecified: Secondary | ICD-10-CM | POA: Diagnosis not present

## 2017-03-02 DIAGNOSIS — T451X5A Adverse effect of antineoplastic and immunosuppressive drugs, initial encounter: Secondary | ICD-10-CM

## 2017-03-02 DIAGNOSIS — D6481 Anemia due to antineoplastic chemotherapy: Secondary | ICD-10-CM

## 2017-03-02 DIAGNOSIS — C221 Intrahepatic bile duct carcinoma: Secondary | ICD-10-CM

## 2017-03-02 DIAGNOSIS — I10 Essential (primary) hypertension: Secondary | ICD-10-CM | POA: Diagnosis not present

## 2017-03-02 DIAGNOSIS — R530 Neoplastic (malignant) related fatigue: Secondary | ICD-10-CM

## 2017-03-02 DIAGNOSIS — E876 Hypokalemia: Secondary | ICD-10-CM | POA: Insufficient documentation

## 2017-03-02 DIAGNOSIS — R531 Weakness: Secondary | ICD-10-CM

## 2017-03-02 DIAGNOSIS — Z87891 Personal history of nicotine dependence: Secondary | ICD-10-CM | POA: Insufficient documentation

## 2017-03-02 DIAGNOSIS — J8410 Pulmonary fibrosis, unspecified: Secondary | ICD-10-CM | POA: Diagnosis not present

## 2017-03-02 DIAGNOSIS — R600 Localized edema: Secondary | ICD-10-CM | POA: Insufficient documentation

## 2017-03-02 DIAGNOSIS — R5383 Other fatigue: Secondary | ICD-10-CM

## 2017-03-02 DIAGNOSIS — Z79899 Other long term (current) drug therapy: Secondary | ICD-10-CM

## 2017-03-02 DIAGNOSIS — K5900 Constipation, unspecified: Secondary | ICD-10-CM | POA: Diagnosis not present

## 2017-03-02 DIAGNOSIS — M5136 Other intervertebral disc degeneration, lumbar region: Secondary | ICD-10-CM

## 2017-03-02 DIAGNOSIS — D6959 Other secondary thrombocytopenia: Secondary | ICD-10-CM | POA: Diagnosis not present

## 2017-03-02 DIAGNOSIS — Z7984 Long term (current) use of oral hypoglycemic drugs: Secondary | ICD-10-CM | POA: Insufficient documentation

## 2017-03-02 DIAGNOSIS — Z8719 Personal history of other diseases of the digestive system: Secondary | ICD-10-CM

## 2017-03-02 DIAGNOSIS — Z803 Family history of malignant neoplasm of breast: Secondary | ICD-10-CM

## 2017-03-02 DIAGNOSIS — R21 Rash and other nonspecific skin eruption: Secondary | ICD-10-CM | POA: Insufficient documentation

## 2017-03-02 DIAGNOSIS — Z5111 Encounter for antineoplastic chemotherapy: Secondary | ICD-10-CM | POA: Diagnosis not present

## 2017-03-02 DIAGNOSIS — E042 Nontoxic multinodular goiter: Secondary | ICD-10-CM

## 2017-03-02 DIAGNOSIS — M129 Arthropathy, unspecified: Secondary | ICD-10-CM | POA: Diagnosis not present

## 2017-03-02 DIAGNOSIS — K219 Gastro-esophageal reflux disease without esophagitis: Secondary | ICD-10-CM | POA: Diagnosis not present

## 2017-03-02 DIAGNOSIS — F329 Major depressive disorder, single episode, unspecified: Secondary | ICD-10-CM | POA: Diagnosis not present

## 2017-03-02 DIAGNOSIS — M7989 Other specified soft tissue disorders: Secondary | ICD-10-CM | POA: Diagnosis not present

## 2017-03-02 DIAGNOSIS — E1165 Type 2 diabetes mellitus with hyperglycemia: Secondary | ICD-10-CM

## 2017-03-02 DIAGNOSIS — J449 Chronic obstructive pulmonary disease, unspecified: Secondary | ICD-10-CM | POA: Insufficient documentation

## 2017-03-02 DIAGNOSIS — Z8042 Family history of malignant neoplasm of prostate: Secondary | ICD-10-CM | POA: Insufficient documentation

## 2017-03-02 DIAGNOSIS — E46 Unspecified protein-calorie malnutrition: Secondary | ICD-10-CM | POA: Diagnosis not present

## 2017-03-02 LAB — COMPREHENSIVE METABOLIC PANEL
ALBUMIN: 3 g/dL — AB (ref 3.5–5.0)
ALT: 43 U/L (ref 14–54)
AST: 49 U/L — ABNORMAL HIGH (ref 15–41)
Alkaline Phosphatase: 318 U/L — ABNORMAL HIGH (ref 38–126)
Anion gap: 7 (ref 5–15)
BUN: 13 mg/dL (ref 6–20)
CALCIUM: 8.7 mg/dL — AB (ref 8.9–10.3)
CHLORIDE: 101 mmol/L (ref 101–111)
CO2: 29 mmol/L (ref 22–32)
CREATININE: 0.93 mg/dL (ref 0.44–1.00)
GFR calc Af Amer: >60 mL/min (ref >60)
GFR calc non Af Amer: 59 mL/min — ABNORMAL LOW (ref >60)
GLUCOSE: 180 mg/dL — AB (ref 65–99)
Potassium: 3.6 mmol/L (ref 3.5–5.1)
SODIUM: 137 mmol/L (ref 135–145)
Total Bilirubin: 0.8 mg/dL (ref 0.3–1.2)
Total Protein: 6.3 g/dL — ABNORMAL LOW (ref 6.5–8.1)

## 2017-03-02 LAB — CBC WITH DIFFERENTIAL/PLATELET
BASOS PCT: 1 %
Basophils Absolute: 0.1 10*3/uL (ref 0–0.1)
EOS ABS: 0.1 10*3/uL (ref 0–0.7)
Eosinophils Relative: 1 %
HCT: 23.5 % — ABNORMAL LOW (ref 35.0–47.0)
HEMOGLOBIN: 7.8 g/dL — AB (ref 12.0–16.0)
LYMPHS ABS: 0.5 10*3/uL — AB (ref 1.0–3.6)
Lymphocytes Relative: 8 %
MCH: 34.1 pg — AB (ref 26.0–34.0)
MCHC: 33.3 g/dL (ref 32.0–36.0)
MCV: 102.5 fL — ABNORMAL HIGH (ref 80.0–100.0)
MONO ABS: 1.3 10*3/uL — AB (ref 0.2–0.9)
MONOS PCT: 19 %
NEUTROS PCT: 71 %
Neutro Abs: 4.7 10*3/uL (ref 1.4–6.5)
Platelets: 256 10*3/uL (ref 150–440)
RBC: 2.29 MIL/uL — ABNORMAL LOW (ref 3.80–5.20)
RDW: 21.2 % — AB (ref 11.5–14.5)
WBC: 6.6 10*3/uL (ref 3.6–11.0)

## 2017-03-02 LAB — TSH: TSH: 2.821 u[IU]/mL (ref 0.350–4.500)

## 2017-03-02 MED ORDER — SODIUM CHLORIDE 0.9 % IV SOLN
INTRAVENOUS | Status: DC
  Administered 2017-03-02: 15:00:00 via INTRAVENOUS
  Filled 2017-03-02: qty 1000

## 2017-03-02 MED ORDER — HEPARIN SOD (PORK) LOCK FLUSH 100 UNIT/ML IV SOLN
500.0000 [IU] | Freq: Once | INTRAVENOUS | Status: AC | PRN
  Administered 2017-03-02: 500 [IU]
  Filled 2017-03-02: qty 5

## 2017-03-02 MED ORDER — SODIUM CHLORIDE 0.9% FLUSH
10.0000 mL | INTRAVENOUS | Status: DC | PRN
  Filled 2017-03-02: qty 10

## 2017-03-02 MED ORDER — SODIUM CHLORIDE 0.9 % IV SOLN
1400.0000 mg | Freq: Once | INTRAVENOUS | Status: AC
  Administered 2017-03-02: 1400 mg via INTRAVENOUS
  Filled 2017-03-02: qty 26.3

## 2017-03-02 MED ORDER — ONDANSETRON 8 MG PO TBDP
8.0000 mg | ORAL_TABLET | Freq: Once | ORAL | Status: AC
  Administered 2017-03-02: 8 mg via ORAL
  Filled 2017-03-02: qty 1

## 2017-03-02 NOTE — Progress Notes (Signed)
Hgb 7.8 MD ok to proceed with treatment. Patient has also been receiving cisplatin and gemzar, however now patient will receive gemzar only. Verified with MD that premeds will be zofran 78m ODT

## 2017-03-02 NOTE — Progress Notes (Signed)
Hematology/Oncology Follow up note [facility] Telephone:([phone]) [phone] [phone]) [phone]  Patient Care Team: Regina Crouch, MD as PCP - General (Internal Medicine) REASON FOR VISIT Follow up for treatment of cholangiocarcinoma  PERTINENT ONCOLOGY HISTORY Regina Hood 76 y.o.  female with PMH listed as below, most significantly for cholangiocarcinoma who has been on chemotherapy treatment presents to transfer her cancer care to our cancer center.  After extensive medical record review, summary of her diagnosis and treatment history are listed below.  1. Initially presented in 07/2016 to an outside ED with lower and upper GI bleed.  A. She was found to have new LFTs elevations including AST 340, ALT 379, Alk phos 420 and t bili 0.9.   B. [place] was obtained, which did not visualize the left lobe.  C. She was evaluated by gastroenterology because of her history of UC and underwent colonoscopy, which was unremarkable per path report.  2. 08/20/16, CT noted diffuse atrophy of the left lobe with left intrahepatic biliary ductal dilatation, with 4.1 cm ill-defined low-attenuation mass. This mass was suspicious for malignancy.  3. 08/2016, MRI/MRCP which showed abnormal left hepatic lobe with diffuse biliary dilatation with a shrunken left hepatic lobe and a 2.42.9 cm oval-shaped masslike component displacing the adjacent bile ducts. There is an approximately 1.6 cm segment truncated bile ducts involving common hepatic duct and right and left hepatic although no dilatation of the right hepatic duct system was seen.   4. 09/02/16, ERCP showed localized biliary stricture in the left intrahepatic duct with dilation of the left intrahepatic branches. Sphincterotomy was performed and a stent was placed. EUS with a 3.3 cm mass and left duct dilation.  5. Repeat ERCP was done 09/10/16, during which the stent was exchanged.  6. 09/20/16, taken to OR and noted to a have an peritoneal  implant with biopsy c/w adenocarcinoma. The neoplastic cells are positive for CK7. They are negative for CK20, CDX-2, TTF-1, Napsin A, hep-par1, glypican 3, and arginase. The immunohistochemical features are consistent with adenocarcinoma of upper gastrointestinal tract or pancreaticobiliary origin.  7. 09/28/16, initially seen in medical oncology clinic by Dr. Filomena Hood 8. 09/29/16, admitted for biliary obstruction and cholangitis, s/p PBD placement, d/c 10/06/16 9. 10/12/16, initiated on gemcitabine (400 mg/m2). CA 19-9 167 10. 10/26/16, increased gemcitabine (800 mg/m2) 11. 11/23/2016 Cycle 3 Gemzar (870m/m2), 12/07/2016, Gemzar (8054mm2) added cisplatin 2529m2  12. 12/14/2016, CT CAP shows slight interval decrease in size of L hepatic lobe cholangio. Slight interval increase in size of porta hepatis lymph node whiel other nodes are slightly smaller. Multiple bilateral thyroid nodules. Stable mediastinal lymph nodes. CA 19-9 60 14  8/30 Cycle 4 Gemzar (800m40m) added cisplatin 20mg45m 15, 12/28/2016 Gemcitabine 800mg/76mcisplatin was hold due to anemia. S/p 1 PRBC transfusion.   Regina Hood   Transferred her care to Regina Hood. Regina Hood    She forgot her appointment on day 8 and Gemcitabine 800mg/m25md cisplatin 20mg/m255m given  Day 9 of cycle 4 (01/05/2017).        01/19/2017  Cycle 5 Day 1Gemcitabine 800mg/m2,48mplatin 20mg/m2, 78munit blood transfusion.        01/26/2017 Cycle 5 Day 8 Gemcitabine 800mg/m2, c25matin 20mg/m2   159me has been on Dexamethasone 4mg daily fo33mkin rash since this summer by [facility], [facility]examethasone to 3mg since 9/254m018.  INTERVAL HISTORY Regina Hood 75KIRSTEIN [id number] above  history reviewed by me today who presents for evaluation prior to cycle 6 chemotherapy Gemcitabine and Cisplatin treatment. Patient reports continued to have fatigue and general weakness. She denies any pain. Appetite is good. She also has some lower extremity swelling  bilaterally. Her skin rash due to chronic neurotology disorders is stable. On dexamethasone 1 mg daily. She continued to have blurry vision secondary to cataracts.      Review of Systems  Constitutional: Negative for appetite change and chills.  HENT:   Negative for hearing loss.   Eyes: Positive for eye problems.  Respiratory: Negative for chest tightness.   Cardiovascular: Negative for chest pain.  Gastrointestinal: Negative for abdominal distention.  Endocrine: Negative for hot flashes.  Genitourinary: Negative for difficulty urinating.   Skin:       Skin inflammation at the suture site of percutaneous bile ducts draining.  Neurological: Negative for dizziness.  Hematological: Negative for adenopathy.  Psychiatric/Behavioral: The patient is not nervous/anxious.    MEDICAL HISTORY:  Past Medical History:  Diagnosis Date  . Arthritis   . Cancer ([place])   . Cellulitis   . COPD (chronic obstructive pulmonary disease) ([place])   . DDD (degenerative disc disease), lumbar   . Depression   . Diabetes mellitus   . Diverticulitis   . GERD (gastroesophageal reflux disease)   . GI bleed   . Headache(784.0)   . Hypertension   . Lupus   . Ulcerative colitis ([place])     SURGICAL HISTORY: Past Surgical History:  Procedure Laterality Date  . ABDOMINAL HYSTERECTOMY    . BREAST BIOPSY Right   . COLONOSCOPY      SOCIAL HISTORY: Social History   Socioeconomic History  . Marital status: Widowed    Spouse name: Not on file  . Number of children: Not on file  . Years of education: Not on file  . Highest education level: Not on file  Social Needs  . Financial resource strain: Not on file  . Food insecurity - worry: Not on file  . Food insecurity - inability: Not on file  . Transportation needs - medical: Not on file  . Transportation needs - non-medical: Not on file  Occupational History  . Not on file  Tobacco Use  . Smoking status: Former Smoker    Packs/day: 1.00    Years:  10.00    Pack years: 10.00    Types: Cigarettes    Last attempt to quit: 12/30/1998    Years since quitting: 18.1  . Smokeless tobacco: Never Used  . Tobacco comment: quit smoking in 2003  Substance and Sexual Activity  . Alcohol use: No  . Drug use: No  . Sexual activity: Not on file  Other Topics Concern  . Not on file  Social History Narrative  . Not on file    FAMILY HISTORY: Family History  Problem Relation Age of Onset  . Breast cancer Mother 22  . COPD Father   . Prostate cancer Brother     ALLERGIES:  is allergic to feldene [piroxicam]; penicillamine; and qualaquin [quinine].  MEDICATIONS:  Current Outpatient Medications  Medication Sig Dispense Refill  . acetaminophen (TYLENOL) 500 MG tablet Take 1,000 mg by mouth 3 (three) times daily.    Marland Kitchen amLODipine (NORVASC) 5 MG tablet Take 5 mg by mouth daily.    Marland Kitchen azelastine (ASTELIN) 0.1 % nasal spray Place into the nose.    . calcium carbonate 100 mg/ml SUSP Take by mouth.    . clobetasol  ointment (TEMOVATE) 0.05 % Apply topically.    . Cyanocobalamin (VITAMIN B-12 PO) Take 1,000 mcg by mouth daily.    Marland Kitchen dexamethasone (DECADRON) 1 MG tablet Take 3 tablets (3 mg total) by mouth daily. 90 tablet 3  . estradiol (ESTRACE) 1 MG tablet Take 1 mg by mouth daily.    Marland Kitchen gabapentin (NEURONTIN) 100 MG capsule Take 100 mg by mouth 3 (three) times daily.     . hydroxychloroquine (PLAQUENIL) 200 MG tablet Take by mouth.    . lidocaine-prilocaine (EMLA) cream Apply 1 application topically as needed. 30 g 3  . metFORMIN (GLUCOPHAGE) 500 MG tablet Take 500 mg by mouth 2 (two) times daily with a meal.    . ondansetron (ZOFRAN) 8 MG tablet Take 1 tablet (8 mg total) by mouth every 12 (twelve) hours as needed. 30 tablet 3  . oxyCODONE (OXY IR/ROXICODONE) 5 MG immediate release tablet Take 1 tablet (5 mg total) by mouth every 6 (six) hours as needed. 120 tablet 0  . pantoprazole (PROTONIX) 40 MG tablet Take 40 mg by mouth daily.    .  polyethylene glycol powder (GLYCOLAX/MIRALAX) powder     . prochlorperazine (COMPAZINE) 10 MG tablet Take 1 tablet (10 mg total) by mouth every 6 (six) hours as needed. 30 tablet 3  . senna (SENOKOT) 8.6 MG TABS tablet Take 2 tablets (17.2 mg total) by mouth daily. Hold for loose stools. 120 each 3  . Sodium Chloride Flush (NORMAL SALINE FLUSH) 0.9 % SOLN 1 mL by Other route every 8 (eight) hours. Flush each biliary drain with 1 mL twice daily 84 Syringe 3  . traMADol (ULTRAM) 50 MG tablet Take 50 mg by mouth 2 (two) times daily.     Marland Kitchen venlafaxine XR (EFFEXOR-XR) 75 MG 24 hr capsule Take 225 mg by mouth daily.     Marland Kitchen zolpidem (AMBIEN) 10 MG tablet Take 1 tablet (10 mg total) by mouth at bedtime as needed. 30 tablet 3   No current facility-administered medications for this visit.       Marland Kitchen  PHYSICAL EXAMINATION: ECOG PERFORMANCE STATUS: 1 - Symptomatic but completely ambulatory Vitals:   03/02/17 1346  BP: (!) 148/86  Pulse: (!) 108  Resp: 18  Temp: (!) 96.8 F (36 C)   Filed Weights   03/02/17 1346  Weight: 159 lb 3.2 oz (72.2 kg)  GENERAL: No distress, well nourished. Positive for pallor SKIN:  No rashes or significant lesions  HEAD: Normocephalic, No masses, lesions, tenderness or abnormalities  EYES: Conjunctiva are pale, non icteric ENT: External ears normal ,lips , buccal mucosa, and tongue normal and mucous membranes are moist  LYMPH: No palpable cervical and axillary lymphadenopathy  LUNGS: Clear to auscultation, no crackles or wheezes HEART: Regular rate & rhythm, no murmurs, no gallops, S1 normal and S2 normal  ABDOMEN: Abdomen soft, non-tender, normal bowel sounds, I did not appreciate any  masses or organomegaly 2 PBD tubes with sutures.  MUSCULOSKELETAL: No CVA tenderness and no tenderness on percussion of the back or rib cage.  EXTREMITIES: No edema, no skin discoloration or tenderness NEURO: Alert & oriented, no focal motor/sensory deficits.   LABORATORY DATA:  I  have reviewed the data as listed Lab Results  Component Value Date   WBC 6.6 03/02/2017   HGB 7.8 (L) 03/02/2017   HCT 23.5 (L) 03/02/2017   MCV 102.5 (H) 03/02/2017   PLT 256 03/02/2017   Recent Labs    02/09/17 1310 02/16/17 1313 03/02/17 1324  NA 135 137 137  K 3.5 3.4* 3.6  CL 98* 100* 101  CO2 24 29 29   GLUCOSE 231* 242* 180*  BUN 14 12 13   CREATININE 0.44 0.62 0.93  CALCIUM 8.8* 8.5* 8.7*  GFRNONAA >60 >60 59*  GFRAA >60 >60 >60  PROT 6.4* 6.0* 6.3*  ALBUMIN 3.0* 3.0* 3.0*  AST 71* 72* 49*  ALT 75* 77* 43  ALKPHOS 420* 361* 318*  BILITOT 1.1 0.7 0.8   [facility] CA 19-9   09/14/16 220 --> 09/28/16 176--> 10/12/16 167-->12/07/2016 64-->12/14/2016 60. --> 12/29/2016 87--> 01/26/2017 99  RADIOGRAPHIC STUDIES: I have personally reviewed the radiological images as listed and agreed with the findings in the report. CT 12/14/2016 [facility] Impression: 1. Slight interval decrease in size of the left hepatic lobe cholangiocarcinoma. Slight interval decrease in size of the hypoattenuating left hepatic lobe lesion which now measures 3.6 x 2.8 cm, previously 3.9 x 2.9 cm 2. Slight interval increase in size of a porta hepatis lymph node. Other nodes are slightly smaller. 3. Multiple bilateral thyroid nodules. Recommend ultrasound for further evaluation. 4. Stable mediastinal lymph nodes.  CT chest abdomen pelvis 02/24/2017 comparing to 5/918 Study in [facility].  1. No acute findings within the chest, abdomen or pelvis. 2. There has been interval decrease in size of mass associated with left lobe of liver. No specific findings identified to suggest metastatic disease.Mass within the anterior and central aspect of the left lobe appears decreased in size from previous exam measuring 3.0 x 3.2 cm, 3. Bilateral percutaneous biliary drainage catheters are in place without significant biliary ductal dilatation. 4. Bilateral pleural calcifications and chronic  interstitial changes of pulmonary fibrosis. Prominent mediastinal lymph nodes are nonspecific in the setting of interstitial lung disease.   ASSESSMENT & PLAN:  Cancer Staging Cholangiocarcinoma [facility]) Staging form: Intrahepatic Bile Duct, AJCC 8th Edition - Clinical stage from 12/29/2016: Stage IV (cT1, cNX, pM1) - Signed by Earlie Server, MD on 12/29/2016  1. Cholangiocarcinoma ([place])   2. Anemia due to antineoplastic chemotherapy   3. Neoplastic malignant related fatigue   4. Encounter for antineoplastic chemotherapy    # s/p Cycle 7 Gemcitabine +/- Cisplatin. Tumor marker slightly increased. CT scan showed partial response.     Cisplatin was discontinued. Ok to proceed single Agent Gemcitabine   # chemotherapy-induced anemia, high ferritin, s/p multiple PRBCs transfusion. Plan Procrit 40,000unit monthly.      # Thrombocytopenia secondary to chemotherapy, resolved.   # Constipation due to Opiod: controlled. continue Sennakot 2 tabs daily. Marilax PRN. She has daily BM.   # Skin rash/lupus, follows up with Rhematology. She is on Plaquenil and Dexamethasone 59m daily [facility] Tape[facility] down to 319mdaily, will keep her on 1 mg for now. # hyperglycemia, glucose improved after dexamethasone To 1 Mg Daily. # I have previously refer the patient to palliative care medicine for symptom control.  All questions were answered. The patient knows to call the clinic with any problems questions or concerns.  Return of visit: 1 week, Lab/MD/Gemcitabine/transfusion/procrit   ZhEarlie ServerMD, PhD Hematology Oncology [facility]- [phone]/14/2018

## 2017-03-02 NOTE — Progress Notes (Signed)
Here for follow up

## 2017-03-03 DIAGNOSIS — E119 Type 2 diabetes mellitus without complications: Secondary | ICD-10-CM | POA: Diagnosis not present

## 2017-03-03 DIAGNOSIS — M328 Other forms of systemic lupus erythematosus: Secondary | ICD-10-CM | POA: Diagnosis not present

## 2017-03-03 DIAGNOSIS — C249 Malignant neoplasm of biliary tract, unspecified: Secondary | ICD-10-CM | POA: Diagnosis not present

## 2017-03-03 DIAGNOSIS — J439 Emphysema, unspecified: Secondary | ICD-10-CM | POA: Diagnosis not present

## 2017-03-03 DIAGNOSIS — Z23 Encounter for immunization: Secondary | ICD-10-CM | POA: Diagnosis not present

## 2017-03-03 DIAGNOSIS — I10 Essential (primary) hypertension: Secondary | ICD-10-CM | POA: Diagnosis not present

## 2017-03-03 DIAGNOSIS — J449 Chronic obstructive pulmonary disease, unspecified: Secondary | ICD-10-CM | POA: Diagnosis not present

## 2017-03-03 LAB — CANCER ANTIGEN 19-9: CAN 19-9: 53 U/mL — AB (ref 0–35)

## 2017-03-04 ENCOUNTER — Other Ambulatory Visit: Payer: Self-pay | Admitting: *Deleted

## 2017-03-04 DIAGNOSIS — T451X5A Adverse effect of antineoplastic and immunosuppressive drugs, initial encounter: Secondary | ICD-10-CM

## 2017-03-04 DIAGNOSIS — R112 Nausea with vomiting, unspecified: Secondary | ICD-10-CM

## 2017-03-04 DIAGNOSIS — C221 Intrahepatic bile duct carcinoma: Secondary | ICD-10-CM

## 2017-03-04 NOTE — Telephone Encounter (Signed)
Patient has refills on existing prescriptions. She was informed of this and will call pharmacy

## 2017-03-08 ENCOUNTER — Other Ambulatory Visit: Payer: Self-pay | Admitting: Oncology

## 2017-03-08 NOTE — Progress Notes (Signed)
Hematology/Oncology Follow up note [facility] Telephone:([phone] [phone]  Patient Care Team: Idelle Crouch, MD as PCP - General (Internal Medicine) REASON FOR VISIT Follow up for treatment of cholangiocarcinoma  PERTINENT ONCOLOGY HISTORY Regina Hood 76 y.o.  female with PMH listed as below, most significantly for cholangiocarcinoma who has been on chemotherapy treatment presents to transfer her cancer care to our cancer center.  After extensive medical record review, summary of her diagnosis and treatment history are listed below.  1. Initially presented in 07/2016 to an outside ED with lower and upper GI bleed.  A. She was found to have new LFTs elevations including AST 340, ALT 379, Alk phos 420 and t bili 0.9.   B. [place] was obtained, which did not visualize the left lobe.  C. She was evaluated by gastroenterology because of her history of UC and underwent colonoscopy, which was unremarkable per path report.  2. 08/20/16, CT noted diffuse atrophy of the left lobe with left intrahepatic biliary ductal dilatation, with 4.1 cm ill-defined low-attenuation mass. This mass was suspicious for malignancy.  3. 08/2016, MRI/MRCP which showed abnormal left hepatic lobe with diffuse biliary dilatation with a shrunken left hepatic lobe and a 2.42.9 cm oval-shaped masslike component displacing the adjacent bile ducts. There is an approximately 1.6 cm segment truncated bile ducts involving common hepatic duct and right and left hepatic although no dilatation of the right hepatic duct system was seen.   4. 09/02/16, ERCP showed localized biliary stricture in the left intrahepatic duct with dilation of the left intrahepatic branches. Sphincterotomy was performed and a stent was placed. EUS with a 3.3 cm mass and left duct dilation.  5. Repeat ERCP was done 09/10/16, during which the stent was exchanged.  6. 09/20/16, taken to OR and noted to a have an peritoneal  implant with biopsy c/w adenocarcinoma. The neoplastic cells are positive for CK7. They are negative for CK20, CDX-2, TTF-1, Napsin A, hep-par1, glypican 3, and arginase. The immunohistochemical features are consistent with adenocarcinoma of upper gastrointestinal tract or pancreaticobiliary origin.  7. 09/28/16, initially seen in medical oncology clinic by Dr. Filomena Jungling 8. 09/29/16, admitted for biliary obstruction and cholangitis, s/p PBD placement, d/c 10/06/16 9. 10/12/16, initiated on gemcitabine (400 mg/m2). CA 19-9 167 10. 10/26/16, increased gemcitabine (800 mg/m2) 11. 11/23/2016 Cycle 3 Gemzar (874m/m2), 12/07/2016, Gemzar (8044mm2) added cisplatin 2553m2  12. 12/14/2016, CT CAP shows slight interval decrease in size of L hepatic lobe cholangio. Slight interval increase in size of porta hepatis lymph node whiel other nodes are slightly smaller. Multiple bilateral thyroid nodules. Stable mediastinal lymph nodes. CA 19-9 60 14  8/30 Cycle 4 Gemzar (800m46m) added cisplatin 20mg48m 15, 12/28/2016 Gemcitabine 800mg/3mcisplatin was hold due to anemia. S/p 1 PRBC transfusion.   [facility]   [facility] care to [facility] [place]    She forgot her appointment on day 8 and Gemcitabine 800mg/m61md cisplatin 20mg/m233m given  Day 9 of cycle 4 (01/05/2017).        01/19/2017  Cycle 5 Day 1Gemcitabine 800mg/m2,26mplatin 20mg/m2, 69munit blood transfusion.        01/26/2017 Cycle 5 Day 8 Gemcitabine 800mg/m2, c11matin 20mg/m2   185me has been on Dexamethasone 4mg daily fo18mkin rash since this [facility] 9/242m018. Recently decreased to 1 mg daily due to hyperglycemia.  INTERVAL HISTORY  Regina Hood 76 y.o.  female with above history reviewed by me today who presents for evaluation prior to cycle 7 chemotherapy day 8  Gemcitabine. Patient continues to have fatigue and generalized weakness. Denies any pain. Appetite is good. She was provided boost  some post last visit and she hasn't started using them. She is on dexamethasone 1 mg daily. She has chronic skin rash due to autoimmune disease. She has a flare of skin rash and which causing itchiness.    Review of Systems  Constitutional: Positive for fatigue. Negative for appetite change and chills.  HENT:   Negative for hearing loss.   Eyes: Negative for eye problems.  Respiratory: Negative for chest tightness.   Cardiovascular: Negative for chest pain.  Gastrointestinal: Negative for abdominal distention.  Endocrine: Negative for hot flashes.  Genitourinary: Negative for difficulty urinating.   Skin: Positive for itching and rash.       Skin inflammation at the suture site of percutaneous bile ducts draining.  Neurological: Negative for dizziness.  Hematological: Negative for adenopathy.  Psychiatric/Behavioral: The patient is not nervous/anxious.    MEDICAL HISTORY:  Past Medical History:  Diagnosis Date  . Arthritis   . Cancer ([place])   . Cellulitis   . COPD (chronic obstructive pulmonary disease) ([place])   . DDD (degenerative disc disease), lumbar   . Depression   . Diabetes mellitus   . Diverticulitis   . GERD (gastroesophageal reflux disease)   . GI bleed   . Headache(784.0)   . Hypertension   . Lupus   . Ulcerative colitis ([place])     SURGICAL HISTORY: Past Surgical History:  Procedure Laterality Date  . ABDOMINAL HYSTERECTOMY    . BREAST BIOPSY Right   . COLONOSCOPY    . COLONOSCOPY WITH PROPOFOL N/A 08/18/2016   Procedure: COLONOSCOPY WITH PROPOFOL;  Surgeon: Manya Silvas, MD;  Location: [facility] ENDOSCOPY;  Service: Endoscopy;  Laterality: N/A;  . ESOPHAGOGASTRODUODENOSCOPY (EGD) WITH PROPOFOL N/A 08/18/2016   Procedure: ESOPHAGOGASTRODUODENOSCOPY (EGD) WITH PROPOFOL;  Surgeon: Manya Silvas, MD;  Location: [facility] ENDOSCOPY;  Service: Endoscopy;  Laterality: N/A;  . ORIF HUMERUS FRACTURE  09/17/2011   Procedure: OPEN REDUCTION INTERNAL FIXATION (ORIF) PROXIMAL  HUMERUS FRACTURE;  Surgeon: Augustin Schooling, MD;  Location: [place];  Service: Orthopedics;  Laterality: Left;    SOCIAL HISTORY: Social History   Socioeconomic History  . Marital status: Widowed    Spouse name: Not on file  . Number of children: Not on file  . Years of education: Not on file  . Highest education level: Not on file  Social Needs  . Financial resource strain: Not on file  . Food insecurity - worry: Not on file  . Food insecurity - inability: Not on file  . Transportation needs - medical: Not on file  . Transportation needs - non-medical: Not on file  Occupational History  . Not on file  Tobacco Use  . Smoking status: Former Smoker    Packs/day: 1.00    Years: 10.00    Pack years: 10.00    Types: Cigarettes    Last attempt to quit: 12/30/1998    Years since quitting: 18.2  . Smokeless tobacco: Never Used  . Tobacco comment: quit smoking in 2003  Substance and Sexual Activity  . Alcohol use: No  . Drug use: No  . Sexual activity: Not on file  Other Topics Concern  . Not on file  Social History Narrative  . Not on file  FAMILY HISTORY: Family History  Problem Relation Age of Onset  . Breast cancer Mother 20  . COPD Father   . Prostate cancer Brother     ALLERGIES:  is allergic to feldene [piroxicam]; penicillamine; and qualaquin [quinine].  MEDICATIONS:  Current Outpatient Medications  Medication Sig Dispense Refill  . acetaminophen (TYLENOL) 500 MG tablet Take 1,000 mg by mouth 3 (three) times daily.    Marland Kitchen amLODipine (NORVASC) 5 MG tablet Take 5 mg by mouth daily.    Marland Kitchen azelastine (ASTELIN) 0.1 % nasal spray Place into the nose.    . calcium carbonate 100 mg/ml SUSP Take by mouth.    . clobetasol ointment (TEMOVATE) 0.05 % Apply topically.    . Cyanocobalamin (VITAMIN B-12 PO) Take 1,000 mcg by mouth daily.    Marland Kitchen dexamethasone (DECADRON) 1 MG tablet Take 3 tablets (3 mg total) by mouth daily. (Patient taking differently: Take 3 mg daily with  breakfast by mouth. ) 90 tablet 3  . estradiol (ESTRACE) 1 MG tablet Take 1 mg by mouth daily.    . Fluticasone-Salmeterol (ADVAIR DISKUS) 250-50 MCG/DOSE AEPB Inhale into the lungs.    . gabapentin (NEURONTIN) 100 MG capsule Take 100 mg by mouth 3 (three) times daily.     . hydroxychloroquine (PLAQUENIL) 200 MG tablet Take by mouth.    . lidocaine-prilocaine (EMLA) cream Apply 1 application topically as needed. (Patient not taking: Reported on 03/02/2017) 30 g 3  . LORazepam (ATIVAN) 0.5 MG tablet Take 0.5 mg every 6 (six) hours as needed by mouth.  0  . metFORMIN (GLUCOPHAGE) 500 MG tablet Take 500 mg by mouth 2 (two) times daily with a meal.    . ondansetron (ZOFRAN) 8 MG tablet Take 1 tablet (8 mg total) by mouth every 12 (twelve) hours as needed. 30 tablet 3  . oxyCODONE (OXY IR/ROXICODONE) 5 MG immediate release tablet Take 1 tablet (5 mg total) by mouth every 6 (six) hours as needed. (Patient not taking: Reported on 03/02/2017) 120 tablet 0  . pantoprazole (PROTONIX) 40 MG tablet Take 40 mg by mouth daily.    . polyethylene glycol powder (GLYCOLAX/MIRALAX) powder     . prochlorperazine (COMPAZINE) 10 MG tablet Take 1 tablet (10 mg total) by mouth every 6 (six) hours as needed. (Patient not taking: Reported on 03/02/2017) 30 tablet 3  . senna (SENOKOT) 8.6 MG TABS tablet Take 2 tablets (17.2 mg total) by mouth daily. Hold for loose stools. 120 each 3  . Sodium Chloride Flush (NORMAL SALINE FLUSH) 0.9 % SOLN 1 mL by Other route every 8 (eight) hours. Flush each biliary drain with 1 mL twice daily (Patient not taking: Reported on 03/02/2017) 84 Syringe 3  . traMADol (ULTRAM) 50 MG tablet Take 50 mg by mouth 2 (two) times daily.     Marland Kitchen venlafaxine XR (EFFEXOR-XR) 75 MG 24 hr capsule Take 225 mg by mouth daily.     Marland Kitchen zolpidem (AMBIEN) 10 MG tablet Take 1 tablet (10 mg total) by mouth at bedtime as needed. 30 tablet 3   No current facility-administered medications for this visit.        Marland Kitchen  PHYSICAL EXAMINATION: ECOG PERFORMANCE STATUS: 1 - Symptomatic but completely ambulatory Vitals:   03/09/17 1355  BP: 134/72  Pulse: (!) 108  Temp: 98.3 F (36.8 C)   Filed Weights   03/09/17 1355  Weight: 159 lb 6 oz (72.3 kg)   GENERAL: No distress, well nourished.  SKIN:  Patches of erythematous rash  anterior chest, neck and bilateral shoulder area. HEAD: Normocephalic, No masses, lesions, tenderness or abnormalities  EYES: Conjunctiva are pink, non icteric ENT: External ears normal ,lips , buccal mucosa, and tongue normal and mucous membranes are moist  LYMPH: No palpable cervical and axillary lymphadenopathy  LUNGS: Clear to auscultation, no crackles or wheezes HEART: Regular rate & rhythm, no murmurs, no gallops, S1 normal and S2 normal  ABDOMEN: Abdomen soft, non-tender, normal bowel sounds,  2 PBD tubes with sutures.  MUSCULOSKELETAL: No CVA tenderness and no tenderness on percussion of the back or rib cage.  EXTREMITIES: Bilateral 2+ pitting edema. no skin discoloration or tenderness NEURO: Alert & oriented, no focal motor/sensory deficits.    LABORATORY DATA:  I have reviewed the data as listed Lab Results  Component Value Date   WBC 6.6 03/02/2017   HGB 7.8 (L) 03/02/2017   HCT 23.5 (L) 03/02/2017   MCV 102.5 (H) 03/02/2017   PLT 256 03/02/2017   Recent Labs    02/09/17 1310 02/16/17 1313 03/02/17 1324  NA 135 137 137  K 3.5 3.4* 3.6  CL 98* 100* 101  CO2 _0 GLUCOSE 231* 242* 180*  BUN _1 CREATININE 0.44 0.62 0.93  CALCIUM 8.8* 8.5* 8.7*  GFRNONAA >60 >60 59*  GFRAA >60 >60 >60  PROT 6.4* 6.0* 6.3*  ALBUMIN 3.0* 3.0* 3.0*  AST 71* 72* 49*  ALT 75* 77* 43  ALKPHOS 420* 361* 318*  BILITOT 1.1 0.7 0.8   [facility] CA 19-9   09/14/16 220 --> 09/28/16 176--> 10/12/16 167-->12/07/2016 64-->12/14/2016 60. --> 12/29/2016 87--> 01/26/2017 99  RADIOGRAPHIC STUDIES: I have personally reviewed the radiological images as listed and  agreed with the findings in the report. CT 12/14/2016 [facility] Impression: 1. Slight interval decrease in size of the left hepatic lobe cholangiocarcinoma. Slight interval decrease in size of the hypoattenuating left hepatic lobe lesion which now measures 3.6 x 2.8 cm, previously 3.9 x 2.9 cm 2. Slight interval increase in size of a porta hepatis lymph node. Other nodes are slightly smaller. 3. Multiple bilateral thyroid nodules. Recommend ultrasound for further evaluation. 4. Stable mediastinal lymph nodes.  CT chest abdomen pelvis 02/24/2017 comparing to 5/918 Study in [facility].  1. No acute findings within the chest, abdomen or pelvis. 2. There has been interval decrease in size of mass associated with left lobe of liver. No specific findings identified to suggest metastatic disease.Mass within the anterior and central aspect of the left lobe appears decreased in size from previous exam measuring 3.0 x 3.2 cm, 3. Bilateral percutaneous biliary drainage catheters are in place without significant biliary ductal dilatation. 4. Bilateral pleural calcifications and chronic interstitial changes of pulmonary fibrosis. Prominent mediastinal lymph nodes are nonspecific in the setting of interstitial lung disease.   ASSESSMENT & PLAN:  Cancer Staging Cholangiocarcinoma [facility]) Staging form: Intrahepatic Bile Duct, AJCC 8th Edition - Clinical stage from 12/29/2016: Stage IV (cT1, cNX, pM1) - Signed by Earlie Server, MD on 12/29/2016  1. Cholangiocarcinoma ([place])   2. Anemia due to antineoplastic chemotherapy   3. Neoplastic malignant related fatigue   4. Encounter for antineoplastic chemotherapy   5. Thrombocytopenia (HCC)   6. Leg swelling    # Will hold today's gemcitabine due to profound weakness. Proceed with first dose of Procrit 40,000unit monthly.   # Macrocytic anemia, possibly chemotherapy related.. Will also check K59 and folic level next visit.    # Thrombocytopenia  secondary to chemotherapy, no active bleeding.   # Constipation due to Opiod: controlled. continue Sennakot 2 tabs daily. Marilax PRN. She has daily BM.   # Skin rash/lupus, follows up with Rhematology. She is on Plaquenil and [facility] 1 mg daily due to hyperglycemia. She has skin rash flare, Will start topical steroid cream. # hyperglycemia, glucose improved after dexamethasone  #Hypokalemia, no diarrhea. Will give 20 mEq KCl through central line. # I have previously refer the patient to palliative care medicine for symptom control. # Bilateral lower extremity swelling, patient reports this has been a chronic problem for her and she has been previously worked up for DVT and she was negative. Suggest her to obtain another lower extremity Doppler to rule out DVT and patient declined. Bilateral lower extremity edema can be also secondary to malnutrition. We've discussed about obtaining a nutrition consultation during last visit and she declined. She has nutrition drinks at home and has not started using it. Encourage her to start drinking boost at least once a day. She voices understanding. Advise her to utilize compression stocking bilateral lower extremity. All questions were answered. The patient knows to call the clinic with any problems questions or concerns.  Return of visit: 2 weeks  Lab/MD/Gemcitabine   ZEarlie Server MD, PhD Hematology Oncology CMerced Ambulatory Endoscopy Centerat ABaptist [facility] Hospital - DesotoPager- [phone]/20/2018

## 2017-03-09 ENCOUNTER — Inpatient Hospital Stay (HOSPITAL_BASED_OUTPATIENT_CLINIC_OR_DEPARTMENT_OTHER): Payer: PPO | Admitting: Oncology

## 2017-03-09 ENCOUNTER — Other Ambulatory Visit: Payer: Self-pay

## 2017-03-09 ENCOUNTER — Inpatient Hospital Stay: Payer: PPO

## 2017-03-09 ENCOUNTER — Encounter: Payer: Self-pay | Admitting: Oncology

## 2017-03-09 VITALS — BP 134/72 | HR 108 | Temp 98.3°F | Wt 159.4 lb

## 2017-03-09 DIAGNOSIS — C221 Intrahepatic bile duct carcinoma: Secondary | ICD-10-CM | POA: Diagnosis not present

## 2017-03-09 DIAGNOSIS — D696 Thrombocytopenia, unspecified: Secondary | ICD-10-CM

## 2017-03-09 DIAGNOSIS — E860 Dehydration: Secondary | ICD-10-CM

## 2017-03-09 DIAGNOSIS — Z803 Family history of malignant neoplasm of breast: Secondary | ICD-10-CM

## 2017-03-09 DIAGNOSIS — D6959 Other secondary thrombocytopenia: Secondary | ICD-10-CM

## 2017-03-09 DIAGNOSIS — D6481 Anemia due to antineoplastic chemotherapy: Secondary | ICD-10-CM

## 2017-03-09 DIAGNOSIS — R5383 Other fatigue: Secondary | ICD-10-CM

## 2017-03-09 DIAGNOSIS — K5900 Constipation, unspecified: Secondary | ICD-10-CM

## 2017-03-09 DIAGNOSIS — M7989 Other specified soft tissue disorders: Secondary | ICD-10-CM | POA: Diagnosis not present

## 2017-03-09 DIAGNOSIS — R530 Neoplastic (malignant) related fatigue: Secondary | ICD-10-CM | POA: Diagnosis not present

## 2017-03-09 DIAGNOSIS — F329 Major depressive disorder, single episode, unspecified: Secondary | ICD-10-CM

## 2017-03-09 DIAGNOSIS — M129 Arthropathy, unspecified: Secondary | ICD-10-CM

## 2017-03-09 DIAGNOSIS — R600 Localized edema: Secondary | ICD-10-CM | POA: Diagnosis not present

## 2017-03-09 DIAGNOSIS — Z5111 Encounter for antineoplastic chemotherapy: Secondary | ICD-10-CM | POA: Diagnosis not present

## 2017-03-09 DIAGNOSIS — Z79899 Other long term (current) drug therapy: Secondary | ICD-10-CM

## 2017-03-09 DIAGNOSIS — M5136 Other intervertebral disc degeneration, lumbar region: Secondary | ICD-10-CM

## 2017-03-09 DIAGNOSIS — E042 Nontoxic multinodular goiter: Secondary | ICD-10-CM

## 2017-03-09 DIAGNOSIS — E876 Hypokalemia: Secondary | ICD-10-CM

## 2017-03-09 DIAGNOSIS — Z87891 Personal history of nicotine dependence: Secondary | ICD-10-CM

## 2017-03-09 DIAGNOSIS — R21 Rash and other nonspecific skin eruption: Secondary | ICD-10-CM

## 2017-03-09 DIAGNOSIS — Z8042 Family history of malignant neoplasm of prostate: Secondary | ICD-10-CM

## 2017-03-09 DIAGNOSIS — T451X5A Adverse effect of antineoplastic and immunosuppressive drugs, initial encounter: Secondary | ICD-10-CM

## 2017-03-09 DIAGNOSIS — K219 Gastro-esophageal reflux disease without esophagitis: Secondary | ICD-10-CM

## 2017-03-09 DIAGNOSIS — E46 Unspecified protein-calorie malnutrition: Secondary | ICD-10-CM | POA: Diagnosis not present

## 2017-03-09 DIAGNOSIS — E1165 Type 2 diabetes mellitus with hyperglycemia: Secondary | ICD-10-CM

## 2017-03-09 DIAGNOSIS — Z8719 Personal history of other diseases of the digestive system: Secondary | ICD-10-CM

## 2017-03-09 DIAGNOSIS — M329 Systemic lupus erythematosus, unspecified: Secondary | ICD-10-CM

## 2017-03-09 DIAGNOSIS — R531 Weakness: Secondary | ICD-10-CM

## 2017-03-09 DIAGNOSIS — Z7984 Long term (current) use of oral hypoglycemic drugs: Secondary | ICD-10-CM

## 2017-03-09 DIAGNOSIS — I10 Essential (primary) hypertension: Secondary | ICD-10-CM

## 2017-03-09 DIAGNOSIS — J449 Chronic obstructive pulmonary disease, unspecified: Secondary | ICD-10-CM

## 2017-03-09 DIAGNOSIS — J8410 Pulmonary fibrosis, unspecified: Secondary | ICD-10-CM

## 2017-03-09 LAB — COMPREHENSIVE METABOLIC PANEL
ALT: 43 U/L (ref 14–54)
AST: 64 U/L — AB (ref 15–41)
Albumin: 2.8 g/dL — ABNORMAL LOW (ref 3.5–5.0)
Alkaline Phosphatase: 297 U/L — ABNORMAL HIGH (ref 38–126)
Anion gap: 9 (ref 5–15)
BILIRUBIN TOTAL: 0.7 mg/dL (ref 0.3–1.2)
BUN: 10 mg/dL (ref 6–20)
CALCIUM: 8.3 mg/dL — AB (ref 8.9–10.3)
CO2: 27 mmol/L (ref 22–32)
CREATININE: 0.52 mg/dL (ref 0.44–1.00)
Chloride: 99 mmol/L — ABNORMAL LOW (ref 101–111)
Glucose, Bld: 202 mg/dL — ABNORMAL HIGH (ref 65–99)
Potassium: 3.3 mmol/L — ABNORMAL LOW (ref 3.5–5.1)
Sodium: 135 mmol/L (ref 135–145)
TOTAL PROTEIN: 6 g/dL — AB (ref 6.5–8.1)

## 2017-03-09 LAB — CBC WITH DIFFERENTIAL/PLATELET
BASOS ABS: 0 10*3/uL (ref 0–0.1)
BASOS PCT: 0 %
EOS ABS: 0 10*3/uL (ref 0–0.7)
EOS PCT: 0 %
HCT: 21.7 % — ABNORMAL LOW (ref 35.0–47.0)
Hemoglobin: 7.2 g/dL — ABNORMAL LOW (ref 12.0–16.0)
Lymphocytes Relative: 14 %
Lymphs Abs: 0.6 10*3/uL — ABNORMAL LOW (ref 1.0–3.6)
MCH: 33.7 pg (ref 26.0–34.0)
MCHC: 33 g/dL (ref 32.0–36.0)
MCV: 102 fL — ABNORMAL HIGH (ref 80.0–100.0)
Monocytes Absolute: 1.1 10*3/uL — ABNORMAL HIGH (ref 0.2–0.9)
Monocytes Relative: 27 %
Neutro Abs: 2.5 10*3/uL (ref 1.4–6.5)
Neutrophils Relative %: 59 %
Platelets: 124 10*3/uL — ABNORMAL LOW (ref 150–440)
RBC: 2.12 MIL/uL — AB (ref 3.80–5.20)
RDW: 19 % — ABNORMAL HIGH (ref 11.5–14.5)
WBC: 4.3 10*3/uL (ref 3.6–11.0)

## 2017-03-09 MED ORDER — TRIAMCINOLONE ACETONIDE 0.1 % EX OINT: 1.0000 "application " | TOPICAL_OINTMENT | Freq: Two times a day (BID) | CUTANEOUS | 0 refills | Status: DC

## 2017-03-09 MED ORDER — HEPARIN SOD (PORK) LOCK FLUSH 100 UNIT/ML IV SOLN
500.0000 [IU] | Freq: Once | INTRAVENOUS | Status: AC
  Administered 2017-03-09: 500 [IU] via INTRAVENOUS

## 2017-03-09 MED ORDER — POTASSIUM CHLORIDE 20 MEQ/100ML IV SOLN
20.0000 meq | Freq: Once | INTRAVENOUS | Status: AC
  Administered 2017-03-09: 20 meq via INTRAVENOUS
  Filled 2017-03-09: qty 100

## 2017-03-09 MED ORDER — SODIUM CHLORIDE 0.9 % IV SOLN
INTRAVENOUS | Status: DC
  Administered 2017-03-09: 15:00:00 via INTRAVENOUS
  Filled 2017-03-09: qty 1000

## 2017-03-09 MED ORDER — MEDICAL COMPRESSION STOCKINGS MISC: 2.0000 | Freq: Every day | 1 refills | Status: DC

## 2017-03-09 MED ORDER — POTASSIUM CHLORIDE CRYS ER 20 MEQ PO TBCR: 20.0000 meq | EXTENDED_RELEASE_TABLET | Freq: Every day | ORAL | 0 refills | Status: DC | PRN

## 2017-03-09 MED ORDER — EPOETIN ALFA 40000 UNIT/ML IJ SOLN
40000.0000 [IU] | Freq: Once | INTRAMUSCULAR | Status: AC
  Administered 2017-03-09: 40000 [IU] via SUBCUTANEOUS
  Filled 2017-03-09: qty 1

## 2017-03-09 NOTE — Progress Notes (Signed)
Patient here today for follow up.   

## 2017-03-09 NOTE — Progress Notes (Signed)
Patient receiving Procrit only today per Dr. Tasia Catchings

## 2017-03-14 ENCOUNTER — Inpatient Hospital Stay: Payer: PPO

## 2017-03-14 NOTE — Progress Notes (Signed)
Nutrition  Patient was scheduled for nutrition appointment today but called to cancel appointment.  Danisa Kopec B. Zenia Resides, [place], [place] Registered Dietitian [phone] (pager)

## 2017-03-15 NOTE — Progress Notes (Deleted)
Hematology/Oncology Follow up note [facility] Telephone:([phone] [phone]  Patient Care Team: Idelle Crouch, MD as PCP - General (Internal Medicine) REASON FOR VISIT Follow up for treatment of cholangiocarcinoma  PERTINENT ONCOLOGY HISTORY Regina Hood 76 y.o.  female with PMH listed as below, most significantly for cholangiocarcinoma who has been on chemotherapy treatment presents to transfer her cancer care to our cancer center.  After extensive medical record review, summary of her diagnosis and treatment history are listed below.  1. Initially presented in 07/2016 to an outside ED with lower and upper GI bleed.  A. She was found to have new LFTs elevations including AST 340, ALT 379, Alk phos 420 and t bili 0.9.   B. [place] was obtained, which did not visualize the left lobe.  C. She was evaluated by gastroenterology because of her history of UC and underwent colonoscopy, which was unremarkable per path report.  2. 08/20/16, CT noted diffuse atrophy of the left lobe with left intrahepatic biliary ductal dilatation, with 4.1 cm ill-defined low-attenuation mass. This mass was suspicious for malignancy.  3. 08/2016, MRI/MRCP which showed abnormal left hepatic lobe with diffuse biliary dilatation with a shrunken left hepatic lobe and a 2.42.9 cm oval-shaped masslike component displacing the adjacent bile ducts. There is an approximately 1.6 cm segment truncated bile ducts involving common hepatic duct and right and left hepatic although no dilatation of the right hepatic duct system was seen.   4. 09/02/16, ERCP showed localized biliary stricture in the left intrahepatic duct with dilation of the left intrahepatic branches. Sphincterotomy was performed and a stent was placed. EUS with a 3.3 cm mass and left duct dilation.  5. Repeat ERCP was done 09/10/16, during which the stent was exchanged.  6. 09/20/16, taken to OR and noted to a have an peritoneal  implant with biopsy c/w adenocarcinoma. The neoplastic cells are positive for CK7. They are negative for CK20, CDX-2, TTF-1, Napsin A, hep-par1, glypican 3, and arginase. The immunohistochemical features are consistent with adenocarcinoma of upper gastrointestinal tract or pancreaticobiliary origin.  7. 09/28/16, initially seen in medical oncology clinic by Dr. Filomena Jungling 8. 09/29/16, admitted for biliary obstruction and cholangitis, s/p PBD placement, d/c 10/06/16 9. 10/12/16, initiated on gemcitabine (400 mg/m2). CA 19-9 167 10. 10/26/16, increased gemcitabine (800 mg/m2) 11. 11/23/2016 Cycle 3 Gemzar (874m/m2), 12/07/2016, Gemzar (8044mm2) added cisplatin 2553m2  12. 12/14/2016, CT CAP shows slight interval decrease in size of L hepatic lobe cholangio. Slight interval increase in size of porta hepatis lymph node whiel other nodes are slightly smaller. Multiple bilateral thyroid nodules. Stable mediastinal lymph nodes. CA 19-9 60 14  8/30 Cycle 4 Gemzar (800m46m) added cisplatin 20mg48m 15, 12/28/2016 Gemcitabine 800mg/3mcisplatin was hold due to anemia. S/p 1 PRBC transfusion.   [facility]   [facility] to [facility] [place]    She forgot her appointment on day 8 and Gemcitabine 800mg/m61md cisplatin 20mg/m233m given  Day 9 of cycle 4 (01/05/2017).        01/19/2017  Cycle 5 Day 1Gemcitabine 800mg/m2,26mplatin 20mg/m2, 69munit blood transfusion.        01/26/2017 Cycle 5 Day 8 Gemcitabine 800mg/m2, c11matin 20mg/m2   185me has been on Dexamethasone 4mg daily fo18mkin rash since this [facility] 9/242m018. Recently decreased to 1 mg daily due to hyperglycemia.  INTERVAL HISTORY  Regina Hood 76 y.o.  female with above history reviewed by me today who presents for evaluation prior to cycle 7 chemotherapy day 8  Gemcitabine. Patient continues to have fatigue and generalized weakness. Denies any pain. Appetite is good. She was provided boost  some post last visit and she hasn't started using them. She is on dexamethasone 1 mg daily. She has chronic skin rash due to autoimmune disease. She has a flare of skin rash and which causing itchiness.    Review of Systems  Constitutional: Positive for fatigue. Negative for appetite change and chills.  HENT:   Negative for hearing loss.   Eyes: Negative for eye problems.  Respiratory: Negative for chest tightness.   Cardiovascular: Negative for chest pain.  Gastrointestinal: Negative for abdominal distention.  Endocrine: Negative for hot flashes.  Genitourinary: Negative for difficulty urinating.   Skin: Positive for itching and rash.       Skin inflammation at the suture site of percutaneous bile ducts draining.  Neurological: Negative for dizziness.  Hematological: Negative for adenopathy.  Psychiatric/Behavioral: The patient is not nervous/anxious.    MEDICAL HISTORY:  Past Medical History:  Diagnosis Date  . Arthritis   . Cancer ([place])   . Cellulitis   . COPD (chronic obstructive pulmonary disease) ([place])   . DDD (degenerative disc disease), lumbar   . Depression   . Diabetes mellitus   . Diverticulitis   . GERD (gastroesophageal reflux disease)   . GI bleed   . Headache(784.0)   . Hypertension   . Lupus   . Ulcerative colitis ([place])     SURGICAL HISTORY: Past Surgical History:  Procedure Laterality Date  . ABDOMINAL HYSTERECTOMY    . BREAST BIOPSY Right   . COLONOSCOPY    . COLONOSCOPY WITH PROPOFOL N/A 08/18/2016   Procedure: COLONOSCOPY WITH PROPOFOL;  Surgeon: Manya Silvas, MD;  Location: [facility] ENDOSCOPY;  Service: Endoscopy;  Laterality: N/A;  . ESOPHAGOGASTRODUODENOSCOPY (EGD) WITH PROPOFOL N/A 08/18/2016   Procedure: ESOPHAGOGASTRODUODENOSCOPY (EGD) WITH PROPOFOL;  Surgeon: Manya Silvas, MD;  Location: [facility] ENDOSCOPY;  Service: Endoscopy;  Laterality: N/A;  . ORIF HUMERUS FRACTURE  09/17/2011   Procedure: OPEN REDUCTION INTERNAL FIXATION (ORIF) PROXIMAL  HUMERUS FRACTURE;  Surgeon: Augustin Schooling, MD;  Location: [place];  Service: Orthopedics;  Laterality: Left;    SOCIAL HISTORY: Social History   Socioeconomic History  . Marital status: Widowed    Spouse name: Not on file  . Number of children: Not on file  . Years of education: Not on file  . Highest education level: Not on file  Social Needs  . Financial resource strain: Not on file  . Food insecurity - worry: Not on file  . Food insecurity - inability: Not on file  . Transportation needs - medical: Not on file  . Transportation needs - non-medical: Not on file  Occupational History  . Not on file  Tobacco Use  . Smoking status: Former Smoker    Packs/day: 1.00    Years: 10.00    Pack years: 10.00    Types: Cigarettes    Last attempt to quit: 12/30/1998    Years since quitting: 18.2  . Smokeless tobacco: Never Used  . Tobacco comment: quit smoking in 2003  Substance and Sexual Activity  . Alcohol use: No  . Drug use: No  . Sexual activity: Not on file  Other Topics Concern  . Not on file  Social History Narrative  . Not on file  FAMILY HISTORY: Family History  Problem Relation Age of Onset  . Breast cancer Mother 9  . COPD Father   . Prostate cancer Brother     ALLERGIES:  is allergic to feldene [piroxicam]; penicillamine; and qualaquin [quinine].  MEDICATIONS:  Current Outpatient Medications  Medication Sig Dispense Refill  . acetaminophen (TYLENOL) 500 MG tablet Take 1,000 mg by mouth 3 (three) times daily.    Marland Kitchen amLODipine (NORVASC) 5 MG tablet Take 5 mg by mouth daily.    Marland Kitchen azelastine (ASTELIN) 0.1 % nasal spray Place into the nose.    . calcium carbonate 100 mg/ml SUSP Take by mouth.    . clobetasol ointment (TEMOVATE) 0.05 % Apply topically.    . Cyanocobalamin (VITAMIN B-12 PO) Take 1,000 mcg by mouth daily.    Marland Kitchen dexamethasone (DECADRON) 1 MG tablet Take 3 tablets (3 mg total) by mouth daily. (Patient taking differently: Take 3 mg daily with  breakfast by mouth. ) 90 tablet 3  . Elastic Bandages & Supports (MEDICAL COMPRESSION STOCKINGS) MISC 2 each by Does not apply route daily. 2 each 1  . estradiol (ESTRACE) 1 MG tablet Take 1 mg by mouth daily.    . Fluticasone-Salmeterol (ADVAIR DISKUS) 250-50 MCG/DOSE AEPB Inhale into the lungs.    . gabapentin (NEURONTIN) 100 MG capsule Take 100 mg by mouth 3 (three) times daily.     . hydroxychloroquine (PLAQUENIL) 200 MG tablet Take by mouth.    . lidocaine-prilocaine (EMLA) cream Apply 1 application topically as needed. 30 g 3  . LORazepam (ATIVAN) 0.5 MG tablet Take 0.5 mg every 6 (six) hours as needed by mouth.  0  . metFORMIN (GLUCOPHAGE) 500 MG tablet Take 500 mg by mouth 2 (two) times daily with a meal.    . ondansetron (ZOFRAN) 8 MG tablet Take 1 tablet (8 mg total) by mouth every 12 (twelve) hours as needed. 30 tablet 3  . oxyCODONE (OXY IR/ROXICODONE) 5 MG immediate release tablet Take 1 tablet (5 mg total) by mouth every 6 (six) hours as needed. 120 tablet 0  . pantoprazole (PROTONIX) 40 MG tablet Take 40 mg by mouth daily.    . polyethylene glycol powder (GLYCOLAX/MIRALAX) powder     . potassium chloride SA (K-DUR,KLOR-CON) 20 MEQ tablet Take 1 tablet (20 mEq total) by mouth daily as needed (low potassium). 30 tablet 0  . prochlorperazine (COMPAZINE) 10 MG tablet Take 1 tablet (10 mg total) by mouth every 6 (six) hours as needed. 30 tablet 3  . senna (SENOKOT) 8.6 MG TABS tablet Take 2 tablets (17.2 mg total) by mouth daily. Hold for loose stools. 120 each 3  . Sodium Chloride Flush (NORMAL SALINE FLUSH) 0.9 % SOLN 1 mL by Other route every 8 (eight) hours. Flush each biliary drain with 1 mL twice daily 84 Syringe 3  . traMADol (ULTRAM) 50 MG tablet Take 50 mg by mouth 2 (two) times daily.     Marland Kitchen triamcinolone ointment (KENALOG) 0.1 % Apply 1 application topically 2 (two) times daily. 80 g 0  . venlafaxine XR (EFFEXOR-XR) 75 MG 24 hr capsule Take 225 mg by mouth daily.     Marland Kitchen  zolpidem (AMBIEN) 10 MG tablet Take 1 tablet (10 mg total) by mouth at bedtime as needed. 30 tablet 3   No current facility-administered medications for this visit.       Marland Kitchen  PHYSICAL EXAMINATION: ECOG PERFORMANCE STATUS: 1 - Symptomatic but completely ambulatory There were no vitals filed for this visit.  There were no vitals filed for this visit. GENERAL: No distress, well nourished.  SKIN:  Patches of erythematous rash anterior chest, neck and bilateral shoulder area. HEAD: Normocephalic, No masses, lesions, tenderness or abnormalities  EYES: Conjunctiva are pink, non icteric ENT: External ears normal ,lips , buccal mucosa, and tongue normal and mucous membranes are moist  LYMPH: No palpable cervical and axillary lymphadenopathy  LUNGS: Clear to auscultation, no crackles or wheezes HEART: Regular rate & rhythm, no murmurs, no gallops, S1 normal and S2 normal  ABDOMEN: Abdomen soft, non-tender, normal bowel sounds,  2 PBD tubes with sutures.  MUSCULOSKELETAL: No CVA tenderness and no tenderness on percussion of the back or rib cage.  EXTREMITIES: Bilateral 2+ pitting edema. no skin discoloration or tenderness NEURO: Alert & oriented, no focal motor/sensory deficits.    LABORATORY DATA:  I have reviewed the data as listed Lab Results  Component Value Date   WBC 4.3 03/09/2017   HGB 7.2 (L) 03/09/2017   HCT 21.7 (L) 03/09/2017   MCV 102.0 (H) 03/09/2017   PLT 124 (L) 03/09/2017   Recent Labs    02/16/17 1313 03/02/17 1324 03/09/17 1328  NA 137 137 135  K 3.4* 3.6 3.3*  CL 100* 101 99*  CO2 29 29 27   GLUCOSE 242* 180* 202*  BUN 12 13 10   CREATININE 0.62 0.93 0.52  CALCIUM 8.5* 8.7* 8.3*  GFRNONAA >60 59* >60  GFRAA >60 >60 >60  PROT 6.0* 6.3* 6.0*  ALBUMIN 3.0* 3.0* 2.8*  AST 72* 49* 64*  ALT 77* 43 43  ALKPHOS 361* 318* 297*  BILITOT 0.7 0.8 0.7   [facility] CA 19-9   09/14/16 220 --> 09/28/16 176--> 10/12/16 167-->12/07/2016 64-->12/14/2016 60. -->  12/29/2016 87--> 01/26/2017 99  RADIOGRAPHIC STUDIES: I have personally reviewed the radiological images as listed and agreed with the findings in the report. CT 12/14/2016 [facility] Impression: 1. Slight interval decrease in size of the left hepatic lobe cholangiocarcinoma. Slight interval decrease in size of the hypoattenuating left hepatic lobe lesion which now measures 3.6 x 2.8 cm, previously 3.9 x 2.9 cm 2. Slight interval increase in size of a porta hepatis lymph node. Other nodes are slightly smaller. 3. Multiple bilateral thyroid nodules. Recommend ultrasound for further evaluation. 4. Stable mediastinal lymph nodes.  CT chest abdomen pelvis 02/24/2017 comparing to 5/918 Study in [facility].  1. No acute findings within the chest, abdomen or pelvis. 2. There has been interval decrease in size of mass associated with left lobe of liver. No specific findings identified to suggest metastatic disease.Mass within the anterior and central aspect of the left lobe appears decreased in size from previous exam measuring 3.0 x 3.2 cm, 3. Bilateral percutaneous biliary drainage catheters are in place without significant biliary ductal dilatation. 4. Bilateral pleural calcifications and chronic interstitial changes of pulmonary fibrosis. Prominent mediastinal lymph nodes are nonspecific in the setting of interstitial lung disease.   ASSESSMENT & PLAN:  Cancer Staging Cholangiocarcinoma [facility]) Staging form: Intrahepatic Bile Duct, AJCC 8th Edition - Clinical stage from 12/29/2016: Stage IV (cT1, cNX, pM1) - Signed by Earlie Server, MD on 12/29/2016  No diagnosis found. # Will hold today's gemcitabine due to profound weakness. Proceed with first dose of Procrit 40,000unit monthly.   # Macrocytic anemia, possibly chemotherapy related.. Will also check M19 and folic level next visit.    # Thrombocytopenia secondary to chemotherapy, no active bleeding.   # Constipation due to Opiod:  controlled.  continue Sennakot 2 tabs daily. Marilax PRN. She has daily BM.   # Skin rash/lupus, follows up with Rhematology. She is on Plaquenil and Dexamethasone [facility]m [facility]ose was decreased to 1 mg daily due to hyperglycemia. She has skin rash flare, Will start topical steroid cream. # hyperglycemia, glucose improved after dexamethasone  #Hypokalemia, no diarrhea. Will give 20 mEq KCl through central line. # I have previously refer the patient to palliative care medicine for symptom control. # Bilateral lower extremity swelling, patient reports this has been a chronic problem for her and she has been previously worked up for DVT and she was negative. Suggest her to obtain another lower extremity Doppler to rule out DVT and patient declined. Bilateral lower extremity edema can be also secondary to malnutrition. We've discussed about obtaining a nutrition consultation during last visit and she declined. She has nutrition drinks at home and has not started using it. Encourage her to start drinking boost at least once a day. She voices understanding. Advise her to utilize compression stocking bilateral lower extremity. All questions were answered. The patient knows to call the clinic with any problems questions or concerns.  Return of visit: 2 weeks  Lab/MD/Gemcitabine   ZEarlie Server MD, PhD Hematology Oncology CC[facility] LLCat ASt. John'S Regional Medical CenterPager- [phone]/27/2018

## 2017-03-16 ENCOUNTER — Emergency Department: Payer: PPO

## 2017-03-16 ENCOUNTER — Other Ambulatory Visit: Payer: Self-pay

## 2017-03-16 ENCOUNTER — Inpatient Hospital Stay: Payer: PPO

## 2017-03-16 ENCOUNTER — Inpatient Hospital Stay: Payer: PPO | Admitting: Oncology

## 2017-03-16 ENCOUNTER — Observation Stay
Admission: EM | Admit: 2017-03-16 | Discharge: 2017-03-17 | Disposition: A | Discharge status: Home / Self Care | Payer: PPO | Attending: Internal Medicine | Admitting: Internal Medicine

## 2017-03-16 ENCOUNTER — Encounter: Payer: Self-pay | Admitting: Emergency Medicine

## 2017-03-16 DIAGNOSIS — Z79899 Other long term (current) drug therapy: Secondary | ICD-10-CM | POA: Diagnosis not present

## 2017-03-16 DIAGNOSIS — Z888 Allergy status to other drugs, medicaments and biological substances status: Secondary | ICD-10-CM | POA: Insufficient documentation

## 2017-03-16 DIAGNOSIS — F329 Major depressive disorder, single episode, unspecified: Secondary | ICD-10-CM | POA: Diagnosis not present

## 2017-03-16 DIAGNOSIS — R0600 Dyspnea, unspecified: Secondary | ICD-10-CM | POA: Diagnosis not present

## 2017-03-16 DIAGNOSIS — E1140 Type 2 diabetes mellitus with diabetic neuropathy, unspecified: Secondary | ICD-10-CM | POA: Insufficient documentation

## 2017-03-16 DIAGNOSIS — C221 Intrahepatic bile duct carcinoma: Secondary | ICD-10-CM | POA: Diagnosis not present

## 2017-03-16 DIAGNOSIS — E119 Type 2 diabetes mellitus without complications: Secondary | ICD-10-CM | POA: Insufficient documentation

## 2017-03-16 DIAGNOSIS — Z9981 Dependence on supplemental oxygen: Secondary | ICD-10-CM | POA: Insufficient documentation

## 2017-03-16 DIAGNOSIS — R0902 Hypoxemia: Secondary | ICD-10-CM | POA: Diagnosis not present

## 2017-03-16 DIAGNOSIS — K219 Gastro-esophageal reflux disease without esophagitis: Secondary | ICD-10-CM | POA: Insufficient documentation

## 2017-03-16 DIAGNOSIS — Z79890 Hormone replacement therapy (postmenopausal): Secondary | ICD-10-CM | POA: Insufficient documentation

## 2017-03-16 DIAGNOSIS — M329 Systemic lupus erythematosus, unspecified: Secondary | ICD-10-CM | POA: Diagnosis not present

## 2017-03-16 DIAGNOSIS — I10 Essential (primary) hypertension: Secondary | ICD-10-CM | POA: Diagnosis not present

## 2017-03-16 DIAGNOSIS — J441 Chronic obstructive pulmonary disease with (acute) exacerbation: Secondary | ICD-10-CM | POA: Diagnosis not present

## 2017-03-16 DIAGNOSIS — Z87891 Personal history of nicotine dependence: Secondary | ICD-10-CM | POA: Diagnosis not present

## 2017-03-16 DIAGNOSIS — R0602 Shortness of breath: Secondary | ICD-10-CM | POA: Diagnosis not present

## 2017-03-16 DIAGNOSIS — R05 Cough: Secondary | ICD-10-CM | POA: Diagnosis not present

## 2017-03-16 DIAGNOSIS — D869 Sarcoidosis, unspecified: Secondary | ICD-10-CM | POA: Diagnosis not present

## 2017-03-16 DIAGNOSIS — Z7984 Long term (current) use of oral hypoglycemic drugs: Secondary | ICD-10-CM | POA: Diagnosis not present

## 2017-03-16 DIAGNOSIS — J9621 Acute and chronic respiratory failure with hypoxia: Secondary | ICD-10-CM | POA: Diagnosis not present

## 2017-03-16 DIAGNOSIS — L930 Discoid lupus erythematosus: Secondary | ICD-10-CM | POA: Diagnosis not present

## 2017-03-16 HISTORY — DX: Systemic involvement of connective tissue, unspecified: M35.9

## 2017-03-16 LAB — CBC WITH DIFFERENTIAL/PLATELET
Basophils Absolute: 0 10*3/uL (ref 0–0.1)
Basophils Relative: 0 %
EOS ABS: 0 10*3/uL (ref 0–0.7)
EOS PCT: 0 %
HCT: 27.1 % — ABNORMAL LOW (ref 35.0–47.0)
Hemoglobin: 8.6 g/dL — ABNORMAL LOW (ref 12.0–16.0)
LYMPHS ABS: 0.5 10*3/uL — AB (ref 1.0–3.6)
LYMPHS PCT: 4 %
MCH: 32.5 pg (ref 26.0–34.0)
MCHC: 31.6 g/dL — AB (ref 32.0–36.0)
MCV: 102.7 fL — AB (ref 80.0–100.0)
MONO ABS: 2 10*3/uL — AB (ref 0.2–0.9)
MONOS PCT: 14 %
Neutro Abs: 11.2 10*3/uL — ABNORMAL HIGH (ref 1.4–6.5)
Neutrophils Relative %: 82 %
PLATELETS: 206 10*3/uL (ref 150–440)
RBC: 2.64 MIL/uL — ABNORMAL LOW (ref 3.80–5.20)
RDW: 19 % — AB (ref 11.5–14.5)
WBC: 13.7 10*3/uL — AB (ref 3.6–11.0)

## 2017-03-16 LAB — COMPREHENSIVE METABOLIC PANEL
ALT: 33 U/L (ref 14–54)
ANION GAP: 12 (ref 5–15)
AST: 60 U/L — ABNORMAL HIGH (ref 15–41)
Albumin: 3 g/dL — ABNORMAL LOW (ref 3.5–5.0)
Alkaline Phosphatase: 292 U/L — ABNORMAL HIGH (ref 38–126)
BUN: 11 mg/dL (ref 6–20)
CHLORIDE: 99 mmol/L — AB (ref 101–111)
CO2: 26 mmol/L (ref 22–32)
CREATININE: 0.57 mg/dL (ref 0.44–1.00)
Calcium: 8.4 mg/dL — ABNORMAL LOW (ref 8.9–10.3)
Glucose, Bld: 151 mg/dL — ABNORMAL HIGH (ref 65–99)
Potassium: 3.6 mmol/L (ref 3.5–5.1)
SODIUM: 137 mmol/L (ref 135–145)
Total Bilirubin: 0.8 mg/dL (ref 0.3–1.2)
Total Protein: 6.4 g/dL — ABNORMAL LOW (ref 6.5–8.1)

## 2017-03-16 LAB — PROTIME-INR
INR: 0.96
PROTHROMBIN TIME: 12.7 s (ref 11.4–15.2)

## 2017-03-16 LAB — INFLUENZA PANEL BY PCR (TYPE A & B)
INFLAPCR: NEGATIVE
Influenza B By PCR: NEGATIVE

## 2017-03-16 LAB — TROPONIN I: Troponin I: 0.03 ng/mL (ref <0.03)

## 2017-03-16 LAB — BRAIN NATRIURETIC PEPTIDE: B Natriuretic Peptide: 159 pg/mL — ABNORMAL HIGH (ref 0.0–100.0)

## 2017-03-16 MED ORDER — ENOXAPARIN SODIUM 40 MG/0.4ML ~~LOC~~ SOLN
40.0000 mg | SUBCUTANEOUS | Status: DC
  Administered 2017-03-16: 40 mg via SUBCUTANEOUS
  Filled 2017-03-16: qty 0.4

## 2017-03-16 MED ORDER — IOPAMIDOL (ISOVUE-370) INJECTION 76%
75.0000 mL | Freq: Once | INTRAVENOUS | Status: AC | PRN
  Administered 2017-03-16: 75 mL via INTRAVENOUS

## 2017-03-16 MED ORDER — IPRATROPIUM-ALBUTEROL 0.5-2.5 (3) MG/3ML IN SOLN
3.0000 mL | Freq: Once | RESPIRATORY_TRACT | Status: AC
  Administered 2017-03-16: 3 mL via RESPIRATORY_TRACT

## 2017-03-16 MED ORDER — LORAZEPAM 0.5 MG PO TABS: 0.5000 mg | ORAL_TABLET | Freq: Four times a day (QID) | ORAL | Status: DC | PRN

## 2017-03-16 MED ORDER — VENLAFAXINE HCL ER 75 MG PO CP24
225.0000 mg | ORAL_CAPSULE | Freq: Every day | ORAL | Status: DC
  Administered 2017-03-16 – 2017-03-17 (×2): 225 mg via ORAL
  Filled 2017-03-16 (×2): qty 3

## 2017-03-16 MED ORDER — GABAPENTIN 100 MG PO CAPS
100.0000 mg | ORAL_CAPSULE | Freq: Three times a day (TID) | ORAL | Status: DC
  Administered 2017-03-16 – 2017-03-17 (×3): 100 mg via ORAL
  Filled 2017-03-16 (×3): qty 1

## 2017-03-16 MED ORDER — SENNA 8.6 MG PO TABS
2.0000 | ORAL_TABLET | Freq: Every day | ORAL | Status: DC
  Administered 2017-03-16 – 2017-03-17 (×2): 17.2 mg via ORAL
  Filled 2017-03-16 (×2): qty 2

## 2017-03-16 MED ORDER — AMLODIPINE BESYLATE 5 MG PO TABS
5.0000 mg | ORAL_TABLET | Freq: Every day | ORAL | Status: DC
  Administered 2017-03-16 – 2017-03-17 (×2): 5 mg via ORAL
  Filled 2017-03-16 (×2): qty 1

## 2017-03-16 MED ORDER — DEXAMETHASONE 0.5 MG PO TABS
1.0000 mg | ORAL_TABLET | Freq: Every day | ORAL | Status: DC
  Administered 2017-03-17: 1 mg via ORAL
  Filled 2017-03-16: qty 2

## 2017-03-16 MED ORDER — OXYCODONE HCL 5 MG PO TABS: 5.0000 mg | ORAL_TABLET | Freq: Four times a day (QID) | ORAL | Status: DC | PRN

## 2017-03-16 MED ORDER — PANTOPRAZOLE SODIUM 40 MG PO TBEC
40.0000 mg | DELAYED_RELEASE_TABLET | Freq: Every day | ORAL | Status: DC
  Administered 2017-03-16 – 2017-03-17 (×2): 40 mg via ORAL
  Filled 2017-03-16 (×2): qty 1

## 2017-03-16 MED ORDER — ESTRADIOL 1 MG PO TABS
1.0000 mg | ORAL_TABLET | Freq: Every day | ORAL | Status: DC
  Administered 2017-03-16 – 2017-03-17 (×2): 1 mg via ORAL
  Filled 2017-03-16 (×2): qty 1

## 2017-03-16 MED ORDER — METHYLPREDNISOLONE SODIUM SUCC 125 MG IJ SOLR
125.0000 mg | Freq: Once | INTRAMUSCULAR | Status: AC
  Administered 2017-03-16: 125 mg via INTRAVENOUS

## 2017-03-16 MED ORDER — ACETAMINOPHEN 650 MG RE SUPP: 650.0000 mg | Freq: Four times a day (QID) | RECTAL | Status: DC | PRN

## 2017-03-16 MED ORDER — METHYLPREDNISOLONE SODIUM SUCC 125 MG IJ SOLR
INTRAMUSCULAR | Status: AC
  Administered 2017-03-16: 125 mg via INTRAVENOUS
  Filled 2017-03-16: qty 2

## 2017-03-16 MED ORDER — METHYLPREDNISOLONE SODIUM SUCC 40 MG IJ SOLR
40.0000 mg | Freq: Two times a day (BID) | INTRAMUSCULAR | Status: DC
  Administered 2017-03-17: 40 mg via INTRAVENOUS
  Filled 2017-03-16: qty 1

## 2017-03-16 MED ORDER — BUDESONIDE 0.5 MG/2ML IN SUSP
0.5000 mg | Freq: Two times a day (BID) | RESPIRATORY_TRACT | Status: DC
  Administered 2017-03-16 – 2017-03-17 (×2): 0.5 mg via RESPIRATORY_TRACT
  Filled 2017-03-16 (×2): qty 2

## 2017-03-16 MED ORDER — ZOLPIDEM TARTRATE 5 MG PO TABS
5.0000 mg | ORAL_TABLET | Freq: Every evening | ORAL | Status: DC | PRN
  Administered 2017-03-16: 5 mg via ORAL
  Filled 2017-03-16: qty 1

## 2017-03-16 MED ORDER — ONDANSETRON HCL 4 MG PO TABS
4.0000 mg | ORAL_TABLET | Freq: Four times a day (QID) | ORAL | Status: DC | PRN
  Administered 2017-03-16: 21:00:00 4 mg via ORAL
  Filled 2017-03-16: qty 1

## 2017-03-16 MED ORDER — ACETAMINOPHEN 325 MG PO TABS
650.0000 mg | ORAL_TABLET | Freq: Once | ORAL | Status: AC
  Administered 2017-03-16: 650 mg via ORAL
  Filled 2017-03-16: qty 2

## 2017-03-16 MED ORDER — IPRATROPIUM-ALBUTEROL 0.5-2.5 (3) MG/3ML IN SOLN
3.0000 mL | Freq: Four times a day (QID) | RESPIRATORY_TRACT | Status: DC
  Administered 2017-03-16 – 2017-03-17 (×2): 3 mL via RESPIRATORY_TRACT
  Filled 2017-03-16 (×2): qty 3

## 2017-03-16 MED ORDER — HYDROXYCHLOROQUINE SULFATE 200 MG PO TABS
200.0000 mg | ORAL_TABLET | Freq: Two times a day (BID) | ORAL | Status: DC
  Administered 2017-03-16 – 2017-03-17 (×2): 200 mg via ORAL
  Filled 2017-03-16 (×3): qty 1

## 2017-03-16 MED ORDER — POTASSIUM CHLORIDE CRYS ER 20 MEQ PO TBCR: 20.0000 meq | EXTENDED_RELEASE_TABLET | Freq: Every day | ORAL | Status: DC | PRN

## 2017-03-16 MED ORDER — METFORMIN HCL 500 MG PO TABS
500.0000 mg | ORAL_TABLET | Freq: Two times a day (BID) | ORAL | Status: DC
  Administered 2017-03-16: 18:00:00 500 mg via ORAL
  Filled 2017-03-16 (×2): qty 1

## 2017-03-16 MED ORDER — ONDANSETRON HCL 4 MG/2ML IJ SOLN: 4.0000 mg | Freq: Four times a day (QID) | INTRAMUSCULAR | Status: DC | PRN

## 2017-03-16 MED ORDER — PROCHLORPERAZINE MALEATE 10 MG PO TABS
10.0000 mg | ORAL_TABLET | Freq: Four times a day (QID) | ORAL | Status: DC | PRN
  Filled 2017-03-16: qty 1

## 2017-03-16 MED ORDER — ACETAMINOPHEN 325 MG PO TABS: 650.0000 mg | ORAL_TABLET | Freq: Four times a day (QID) | ORAL | Status: DC | PRN

## 2017-03-16 MED ORDER — IPRATROPIUM-ALBUTEROL 0.5-2.5 (3) MG/3ML IN SOLN
RESPIRATORY_TRACT | Status: AC
  Administered 2017-03-16: 3 mL via RESPIRATORY_TRACT
  Filled 2017-03-16: qty 3

## 2017-03-16 NOTE — ED Notes (Signed)
Up to bathroom.  When pt got back to bed her pulse ox is 80% on RA.  Placed back on 2 liters oxygen and ox comes up to 96%.  She complains of headache and requests tylenol.

## 2017-03-16 NOTE — ED Triage Notes (Signed)
Patient presents to the ED with shortness of breath that began this morning.  Patient states, "I feel like I can't take a good deep breath."  Patient has a "biliary tube" that she states was placed at [facility] "some time ago".  Patient states, "they just put it in, and sent me home, they didn't tell me when to come back or what to do with it."  Bandage around tube has yellow drainage.

## 2017-03-16 NOTE — ED Notes (Signed)
Patient placed on 2L o2 in ED room due to sats of 87% RA. PAtient reports she is suppose to wear 2L O2 at night only but does not wear it.

## 2017-03-16 NOTE — ED Provider Notes (Signed)
[facility] Emergency Department Provider Note ____________________________________________   First MD Initiated Contact with Patient 03/16/17 0932     (approximate)  I have reviewed the triage vital signs and the nursing notes.   HISTORY  Chief Complaint Shortness of Breath    HPI Regina Hood is a 76 y.o. female with past medical history as noted below including COPD, lupus as well as liver cancer for which she is currently getting chemotherapy (last chemo was 1.5 weeks ago) who presents with shortness of breath, acute onset when she woke this morning, associated with some generalized weakness, but not associated with chest pain, cough, fever, or vomiting.  Patient reports bilateral lower extremity swelling which is subacute, but no significant leg pain.  She states she has not felt shortness of breath quite like this before.  Past Medical History:  Diagnosis Date  . Arthritis   . Cancer ([place])   . Cellulitis   . Collagen vascular disease ([place])   . COPD (chronic obstructive pulmonary disease) ([place])   . DDD (degenerative disc disease), lumbar   . Depression   . Diabetes mellitus   . Diverticulitis   . GERD (gastroesophageal reflux disease)   . GI bleed   . Headache(784.0)   . Hypertension   . Lupus   . Ulcerative colitis [facility])     Patient Active Problem List   Diagnosis Date Noted  . Dehydration 01/19/2017  . History of iron deficiency anemia 12/29/2016  . Systemic lupus erythematosus ([place]) 12/29/2016  . Cholangiocarcinoma ([place]) 12/29/2016  . History of ulcerative colitis 12/29/2016  . Goals of care, counseling/discussion 12/29/2016  . Sepsis ([place]) 11/13/2016  . Rectal bleeding 06/29/2016  . Proximal humerus fracture, left, closed, initial encounter 09/17/2011    Past Surgical History:  Procedure Laterality Date  . ABDOMINAL HYSTERECTOMY    . BREAST BIOPSY Right   . COLONOSCOPY    . COLONOSCOPY WITH PROPOFOL N/A 08/18/2016   Procedure:  COLONOSCOPY WITH PROPOFOL;  Surgeon: Manya Silvas, MD;  Location[facility] ENDOSCOPY;  Service: Endoscopy;  Laterality: N/A;  . ESOPHAGOGASTRODUODENOSCOPY (EGD) WITH PROPOFOL N/A 08/18/2016   Procedure: ESOPHAGOGASTRODUODENOSCOPY (EGD) WITH PROPOFOL;  Surgeon: Manya Silvas, MD;  Location: [facility] ENDOSCOPY;  Service: Endoscopy;  Laterality: N/A;  . ORIF HUMERUS FRACTURE  09/17/2011   Procedure: OPEN REDUCTION INTERNAL FIXATION (ORIF) PROXIMAL HUMERUS FRACTURE;  Surgeon: Augustin Schooling, MD;  Location: [place];  Service: Orthopedics;  Laterality: Left;    Prior to Admission medications   Medication Sig Start Date End Date Taking? Authorizing Provider  amLODipine (NORVASC) 5 MG tablet Take 5 mg by mouth daily. 06/01/16  Yes [provider]  Cyanocobalamin (VITAMIN B-12 PO) Take 1,000 mcg by mouth daily.   Yes [provider]  dexamethasone (DECADRON) 1 MG tablet Take 3 tablets (3 mg total) by mouth daily. Patient taking differently: Take 1 mg by mouth daily with breakfast.  02/16/17  Yes Cammie Sickle, MD  estradiol (ESTRACE) 1 MG tablet Take 1 mg by mouth daily. 06/16/16  Yes [provider]  gabapentin (NEURONTIN) 100 MG capsule Take 100 mg by mouth 3 (three) times daily.    Yes [provider]  hydroxychloroquine (PLAQUENIL) 200 MG tablet Take 200 mg by mouth 2 (two) times daily.    Yes [provider]  metFORMIN (GLUCOPHAGE) 500 MG tablet Take 500 mg by mouth 2 (two) times daily with a meal.   Yes [provider]  oxyCODONE (OXY IR/ROXICODONE) 5  MG immediate release tablet Take 1 tablet (5 mg total) by mouth every 6 (six) hours as needed. 01/12/17  Yes Earlie Server, MD  pantoprazole (PROTONIX) 40 MG tablet Take 40 mg by mouth daily.   Yes [provider]  potassium chloride SA (K-DUR,KLOR-CON) 20 MEQ tablet Take 1 tablet (20 mEq total) by mouth daily as needed (low potassium). 03/09/17  Yes Earlie Server, MD  senna (SENOKOT) 8.6 MG TABS  tablet Take 2 tablets (17.2 mg total) by mouth daily. Hold for loose stools. 02/16/17  Yes Cammie Sickle, MD  venlafaxine XR (EFFEXOR-XR) 75 MG 24 hr capsule Take 225 mg by mouth daily.    Yes [provider]  zolpidem (AMBIEN) 10 MG tablet Take 1 tablet (10 mg total) by mouth at bedtime as needed. 02/16/17 03/18/17 Yes Cammie Sickle, MD  acetaminophen (TYLENOL) 500 MG tablet Take 1,000 mg by mouth 3 (three) times daily.    [provider]  azelastine (ASTELIN) 0.1 % nasal spray Place into the nose. 12/24/16 12/24/17  [provider]  calcium carbonate 100 mg/ml SUSP Take by mouth.    [provider]  Elastic Bandages & Supports (MEDICAL COMPRESSION STOCKINGS) [place] 2 each by Does not apply route daily. 03/09/17   Earlie Server, MD  Fluticasone-Salmeterol (ADVAIR DISKUS) 250-50 MCG/DOSE AEPB Inhale into the lungs. 06/01/16 06/01/17  [provider]  lidocaine-prilocaine (EMLA) cream Apply 1 application topically as needed. 02/16/17   Cammie Sickle, MD  LORazepam (ATIVAN) 0.5 MG tablet Take 0.5 mg every 6 (six) hours as needed by mouth. 01/05/17   [provider]  ondansetron (ZOFRAN) 8 MG tablet Take 1 tablet (8 mg total) by mouth every 12 (twelve) hours as needed. 02/16/17   Cammie Sickle, MD  polyethylene glycol powder (GLYCOLAX/MIRALAX) powder  09/25/16   [provider]  prochlorperazine (COMPAZINE) 10 MG tablet Take 1 tablet (10 mg total) by mouth every 6 (six) hours as needed. 02/16/17   Cammie Sickle, MD  Sodium Chloride Flush (NORMAL SALINE FLUSH) 0.9 % SOLN 1 mL by Other route every 8 (eight) hours. Flush each biliary drain with 1 mL twice daily 02/16/17   Cammie Sickle, MD  triamcinolone ointment (KENALOG) 0.1 % Apply 1 application topically 2 (two) times daily. 03/09/17   Earlie Server, MD    Allergies Feldene [piroxicam]; Penicillamine; and Qualaquin [quinine]  Family History  Problem Relation  Age of Onset  . Breast cancer Mother 3  . COPD Father   . Prostate cancer Brother     Social History Social History   Tobacco Use  . Smoking status: Former Smoker    Packs/day: 1.00    Years: 10.00    Pack years: 10.00    Types: Cigarettes    Last attempt to quit: 12/30/1998    Years since quitting: 18.2  . Smokeless tobacco: Never Used  . Tobacco comment: quit smoking in 2003  Substance Use Topics  . Alcohol use: No  . Drug use: No    Review of Systems  Constitutional: No fever. Eyes: No redness. ENT: No sore throat. Cardiovascular: Denies chest pain. Respiratory: Positive for shortness of breath. Gastrointestinal: No nausea, no vomiting.   Genitourinary: Negative for dysuria.  Musculoskeletal: Negative for back pain. Skin: Negative for rash. Neurological: Negative for headaches, focal weakness or numbness.   ____________________________________________   PHYSICAL EXAM:  VITAL SIGNS: ED Triage Vitals  Enc Vitals Group     BP 03/16/17 0856 (!) 138/59  Pulse Rate 03/16/17 0856 (!) 121     Resp 03/16/17 0856 (!) 22     Temp 03/16/17 0856 98.6 F (37 C)     Temp Source 03/16/17 0856 Oral     SpO2 03/16/17 0856 (!) 87 %     Weight 03/16/17 0857 157 lb (71.2 kg)     Height 03/16/17 0857 5' 2"  (1.575 m)     Head Circumference --      Peak Flow --      Pain Score --      Pain Loc --      Pain Edu? --      Excl. in [place]? --     Constitutional: Alert and oriented.  Relatively comfortable appearing and in no acute distress. Eyes: Conjunctivae are normal.  Head: Atraumatic. Nose: No congestion/rhinnorhea. Mouth/Throat: Mucous membranes are moist.   Neck: Normal range of motion.  Cardiovascular: Tachycardic, regular rhythm. Grossly normal heart sounds.  Good peripheral circulation. Respiratory: Slightly increased respiratory effort.  No retractions. Lungs CTAB. Gastrointestinal: No distention.  Genitourinary: No CVA tenderness. Musculoskeletal: Trace  bilateral lower extremity edema.  Extremities warm and well perfused.  Neurologic:  Normal speech and language. No gross focal neurologic deficits are appreciated.  Skin:  Skin is warm and dry. Scattered macular rash to face and torso; chronic per patient Psychiatric: Mood and affect are normal. Speech and behavior are normal.  ____________________________________________   LABS (all labs ordered are listed, but only abnormal results are displayed)  Labs Reviewed  TROPONIN I - Abnormal; Notable for the following components:      Result Value   Troponin I 0.03 (*)    All other components within normal limits  BRAIN NATRIURETIC PEPTIDE - Abnormal; Notable for the following components:   B Natriuretic Peptide 159.0 (*)    All other components within normal limits  CBC WITH DIFFERENTIAL/PLATELET - Abnormal; Notable for the following components:   WBC 13.7 (*)    RBC 2.64 (*)    Hemoglobin 8.6 (*)    HCT 27.1 (*)    MCV 102.7 (*)    MCHC 31.6 (*)    RDW 19.0 (*)    Neutro Abs 11.2 (*)    Lymphs Abs 0.5 (*)    Monocytes Absolute 2.0 (*)    All other components within normal limits  COMPREHENSIVE METABOLIC PANEL - Abnormal; Notable for the following components:   Chloride 99 (*)    Glucose, Bld 151 (*)    Calcium 8.4 (*)    Total Protein 6.4 (*)    Albumin 3.0 (*)    AST 60 (*)    Alkaline Phosphatase 292 (*)    All other components within normal limits  PROTIME-INR  INFLUENZA PANEL BY PCR (TYPE A & B)   ____________________________________________  EKG  ED ECG REPORT I, Arta Silence, the attending physician, personally viewed and interpreted this ECG.  Date: 03/16/2017 EKG Time: 851 Rate: 121 Rhythm: Sinus tachycardia QRS Axis: normal Intervals: normal ST/T Wave abnormalities: normal Narrative Interpretation: no evidence of acute ischemia; tachycardic but no other significant change from EKG dated  11/13/2016   ____________________________________________  RADIOLOGY  CXR: Pulmonary fibrosis, chronic findings CT chest: No acute PE  ____________________________________________   PROCEDURES  Procedure(s) performed: No    Critical Care performed: No ____________________________________________   INITIAL IMPRESSION / ASSESSMENT AND PLAN / ED COURSE  Pertinent labs & imaging results that were available during my care of the patient were reviewed by me and considered  in my medical decision making (see chart for details).  76 year old female with past medical history as noted above presents with acute onset of shortness of breath this morning with some generalized weakness but no cough, fever, chest pain, or significant new leg swelling.  Review of past medical records in Epic confirms history of COPD and lupus.  On exam, vital signs are significant for tachycardia and hypoxia on room air, and patient has slightly increased respiratory effort but no respiratory distress.  The remainder of her exam is unremarkable.  Lungs are clear, and there is trace lower extremity swelling but no significant tenderness or unilateral swelling.  Presentation is most concerning for PE given patient's active cancer, the acute onset of the symptoms, the tachycardia and hypoxia, and the normal lung exam.  Also consider COPD exacerbation, acute bronchitis, viral syndrome, pneumonia, or less likely CHF or other cardiac cause.  Plan for chest x-ray, labs including cardiac workup, and reassess; if no significant findings on chest x-ray we will obtain a CT PE as patient's risk is too high to rule out by d-dimer.   ----------------------------------------- 2:50 PM on 03/16/2017 -----------------------------------------  Chest x-ray does not show acute infiltrate, and CT chest is negative for PE.  Patient had some improvement after steroid and nebs, however on reassessment she is still hypoxic on room air  and slightly tachycardic.  Normally she only uses O2 at night, however here on room air her O2 sat is 88-90.  Patient expressed that she would prefer to go home, however given her persistent tachycardia and hypoxia I do not feel that this is safe at this time.  I also discussed the case with her PMD Dr. Doy Hutching, who agrees with admission.  Patient signed out to the hospitalist Dr. Verdell Carmine.    ____________________________________________   FINAL CLINICAL IMPRESSION(S) / ED DIAGNOSES  Final diagnoses:  COPD exacerbation ([place])  Hypoxia      NEW MEDICATIONS STARTED DURING THIS VISIT:  This SmartLink is deprecated. Use AVSMEDLIST instead to display the medication list for a patient.   Note:  This document was prepared using Dragon voice recognition software and may include unintentional dictation errors.    Arta Silence, MD 03/16/17 1453

## 2017-03-16 NOTE — ED Notes (Signed)
Pt assisted to toilet to void

## 2017-03-16 NOTE — ED Notes (Signed)
Pt given peanut butter, crackers, and water

## 2017-03-16 NOTE — ED Triage Notes (Addendum)
Arrives with c/o shortness of breath.  patietn awake and alert.  Voice clear and strong.  Patient describes symptoms as "not feeling like I can get a full breath".  Onset of symptoms this morning.  Patient has history of liver CA.

## 2017-03-16 NOTE — H&P (Signed)
[place] [facility] [place] NAME: Regina Hood    MR#:  [id number]  DATE OF BIRTH:  01-Aug-1940  DATE OF ADMISSION:  03/16/2017  PRIMARY CARE PHYSICIAN: Idelle Crouch, MD   REQUESTING/REFERRING PHYSICIAN: Dr. Cherylann Banas   CHIEF COMPLAINT:   Chief Complaint  Patient presents with  . Shortness of Breath    HISTORY OF PRESENT ILLNESS:  Regina Hood  is a 76 y.o. female with a known history of cholangiocarcinoma currently ongoing treatment, history of Lupus, ulcerative colitis, GERD, diabetes, depression, COPD, osteoarthritis of presents to the hospital due to shortness of breath. Patient says she develop shortness of breath on minimal exertion last night into this morning. She admits to a cough which is nonproductive but no fevers, chills, nausea, vomiting, sick contacts. She presented to the ER and was noted to be hypoxic and was placed on oxygen. She was only on oxygen at home at bedtime. Patient was noted to be in mild COPD exacerbation with acute hypoxemia and therefore hospitalist services were contacted further treatment and evaluation.  PAST MEDICAL HISTORY:   Past Medical History:  Diagnosis Date  . Arthritis   . Cancer ([place])   . Cellulitis   . Collagen vascular disease ([place])   . COPD (chronic obstructive pulmonary disease) ([place])   . DDD (degenerative disc disease), lumbar   . Depression   . Diabetes mellitus   . Diverticulitis   . GERD (gastroesophageal reflux disease)   . GI bleed   . Headache(784.0)   . Hypertension   . Lupus   . Ulcerative colitis ([place])     PAST SURGICAL HISTORY:   Past Surgical History:  Procedure Laterality Date  . ABDOMINAL HYSTERECTOMY    . BREAST BIOPSY Right   . COLONOSCOPY    . COLONOSCOPY WITH PROPOFOL N/A 08/18/2016   Procedure: COLONOSCOPY WITH PROPOFOL;  Surgeon: Manya Silvas, MD;  Location: [facility] ENDOSCOPY;  Service: Endoscopy;  Laterality: N/A;  . ESOPHAGOGASTRODUODENOSCOPY (EGD) WITH PROPOFOL  N/A 08/18/2016   Procedure: ESOPHAGOGASTRODUODENOSCOPY (EGD) WITH PROPOFOL;  Surgeon: Manya Silvas, MD;  Location: [facility] ENDOSCOPY;  Service: Endoscopy;  Laterality: N/A;  . ORIF HUMERUS FRACTURE  09/17/2011   Procedure: OPEN REDUCTION INTERNAL FIXATION (ORIF) PROXIMAL HUMERUS FRACTURE;  Surgeon: Augustin Schooling, MD;  Location: [facility];  Service: Orthopedics;  Laterality: Left;    SOCIAL HISTORY:   Social History   Tobacco Use  . Smoking status: Former Smoker    Packs/day: 1.00    Years: 10.00    Pack years: 10.00    Types: Cigarettes    Last attempt to quit: 12/30/1998    Years since quitting: 18.2  . Smokeless tobacco: Never Used  . Tobacco comment: quit smoking in 2003  Substance Use Topics  . Alcohol use: No    FAMILY HISTORY:   Family History  Problem Relation Age of Onset  . Breast cancer Mother 62  . COPD Father   . Prostate cancer Brother     DRUG ALLERGIES:   Allergies  Allergen Reactions  . Feldene [Piroxicam]   . Penicillamine Other (See Comments)  . Qualaquin [Quinine]     REVIEW OF SYSTEMS:   Review of Systems  Constitutional: Negative for chills, fever and weight loss.  HENT: Negative for congestion, nosebleeds and tinnitus.   Eyes: Negative for blurred vision, double vision and redness.  Respiratory: Positive for shortness of breath. Negative for cough, hemoptysis and wheezing.   Cardiovascular: Negative for chest  pain, orthopnea, leg swelling and PND.  Gastrointestinal: Negative for abdominal pain, diarrhea, melena, nausea and vomiting.  Genitourinary: Negative for dysuria, hematuria and urgency.  Musculoskeletal: Negative for falls and joint pain.  Neurological: Negative for dizziness, tingling, sensory change, focal weakness, seizures, weakness and headaches.  Endo/Heme/Allergies: Negative for polydipsia. Does not bruise/bleed easily.  Psychiatric/Behavioral: Negative for depression and memory loss. The patient is not nervous/anxious.   All other  systems reviewed and are negative.   MEDICATIONS AT HOME:   Prior to Admission medications   Medication Sig Start Date End Date Taking? Authorizing Provider  amLODipine (NORVASC) 5 MG tablet Take 5 mg by mouth daily. 06/01/16  Yes [provider]  Cyanocobalamin (VITAMIN B-12 PO) Take 1,000 mcg by mouth daily.   Yes [provider]  dexamethasone (DECADRON) 1 MG tablet Take 3 tablets (3 mg total) by mouth daily. Patient taking differently: Take 1 mg by mouth daily with breakfast.  02/16/17  Yes Cammie Sickle, MD  estradiol (ESTRACE) 1 MG tablet Take 1 mg by mouth daily. 06/16/16  Yes [provider]  gabapentin (NEURONTIN) 100 MG capsule Take 100 mg by mouth 3 (three) times daily.    Yes [provider]  hydroxychloroquine (PLAQUENIL) 200 MG tablet Take 200 mg by mouth 2 (two) times daily.    Yes [provider]  metFORMIN (GLUCOPHAGE) 500 MG tablet Take 500 mg by mouth 2 (two) times daily with a meal.   Yes [provider]  oxyCODONE (OXY IR/ROXICODONE) 5 MG immediate release tablet Take 1 tablet (5 mg total) by mouth every 6 (six) hours as needed. 01/12/17  Yes Earlie Server, MD  pantoprazole (PROTONIX) 40 MG tablet Take 40 mg by mouth daily.   Yes [provider]  potassium chloride SA (K-DUR,KLOR-CON) 20 MEQ tablet Take 1 tablet (20 mEq total) by mouth daily as needed (low potassium). 03/09/17  Yes Earlie Server, MD  senna (SENOKOT) 8.6 MG TABS tablet Take 2 tablets (17.2 mg total) by mouth daily. Hold for loose stools. 02/16/17  Yes Cammie Sickle, MD  venlafaxine XR (EFFEXOR-XR) 75 MG 24 hr capsule Take 225 mg by mouth daily.    Yes [provider]  zolpidem (AMBIEN) 10 MG tablet Take 1 tablet (10 mg total) by mouth at bedtime as needed. 02/16/17 03/18/17 Yes Cammie Sickle, MD  acetaminophen (TYLENOL) 500 MG tablet Take 1,000 mg by mouth 3 (three) times daily.    [provider]  azelastine  (ASTELIN) 0.1 % nasal spray Place into the nose. 12/24/16 12/24/17  [provider]  calcium carbonate 100 mg/ml SUSP Take by mouth.    [provider]  Elastic Bandages & Supports (MEDICAL COMPRESSION STOCKINGS) [place] 2 each by Does not apply route daily. 03/09/17   Earlie Server, MD  Fluticasone-Salmeterol (ADVAIR DISKUS) 250-50 MCG/DOSE AEPB Inhale into the lungs. 06/01/16 06/01/17  [provider]  lidocaine-prilocaine (EMLA) cream Apply 1 application topically as needed. 02/16/17   Cammie Sickle, MD  LORazepam (ATIVAN) 0.5 MG tablet Take 0.5 mg every 6 (six) hours as needed by mouth. 01/05/17   [provider]  ondansetron (ZOFRAN) 8 MG tablet Take 1 tablet (8 mg total) by mouth every 12 (twelve) hours as needed. 02/16/17   Cammie Sickle, MD  polyethylene glycol powder (GLYCOLAX/MIRALAX) powder  09/25/16   [provider]  prochlorperazine (COMPAZINE) 10 MG tablet Take 1 tablet (10 mg total) by mouth every 6 (six) hours as needed. 02/16/17  Cammie Sickle, MD  Sodium Chloride Flush (NORMAL SALINE FLUSH) 0.9 % SOLN 1 mL by Other route every 8 (eight) hours. Flush each biliary drain with 1 mL twice daily 02/16/17   Cammie Sickle, MD  triamcinolone ointment (KENALOG) 0.1 % Apply 1 application topically 2 (two) times daily. 03/09/17   Earlie Server, MD      VITAL SIGNS:  Blood pressure 137/79, pulse (!) 110, temperature 98.6 F (37 C), temperature source Oral, resp. rate 18, height 5' 2"  (1.575 m), weight 71.2 kg (157 lb), SpO2 90 %.  PHYSICAL EXAMINATION:  Physical Exam  GENERAL:  76 y.o.-year-old patient lying in the bed in no acute distress.  EYES: Pupils equal, round, reactive to light and accommodation. No scleral icterus. Extraocular muscles intact.  HEENT: Head atraumatic, normocephalic. Oropharynx and nasopharynx clear. No oropharyngeal erythema, moist oral mucosa  NECK:  Supple, no jugular venous distention. No thyroid  enlargement, no tenderness.  LUNGS: Good A/E b/l, minimal end-exp. Wheezing, No rales, rhonchi. No use of accessory muscles of respiration.  CARDIOVASCULAR: S1, S2 RRR. No murmurs, rubs, gallops, clicks.  ABDOMEN: Soft, nontender, nondistended. Bowel sounds present. No organomegaly or mass.  EXTREMITIES: No pedal edema, cyanosis, or clubbing. + 2 pedal & radial pulses b/l.   NEUROLOGIC: Cranial nerves II through XII are intact. No focal Motor or sensory deficits appreciated b/l PSYCHIATRIC: The patient is alert and oriented x 3.  SKIN: No obvious rash, lesion, or ulcer.   LABORATORY PANEL:   CBC Recent Labs  Lab 03/16/17 0913  WBC 13.7*  HGB 8.6*  HCT 27.1*  PLT 206   ------------------------------------------------------------------------------------------------------------------  Chemistries  Recent Labs  Lab 03/16/17 0913  NA 137  K 3.6  CL 99*  CO2 26  GLUCOSE 151*  BUN 11  CREATININE 0.57  CALCIUM 8.4*  AST 60*  ALT 33  ALKPHOS 292*  BILITOT 0.8   ------------------------------------------------------------------------------------------------------------------  Cardiac Enzymes Recent Labs  Lab 03/16/17 0913  TROPONINI 0.03*   ------------------------------------------------------------------------------------------------------------------  RADIOLOGY:  Dg Chest 2 View  Result Date: 03/16/2017 CLINICAL DATA:  76 year old female with increased cough and shortness of breath. Undergoing chemotherapy for cholangiocarcinoma. Pulmonary fibrosis. EXAM: CHEST  2 VIEW COMPARISON:  Restaging CT chest abdomen and pelvis 02/24/2017 and earlier. FINDINGS: Seated AP and lateral views of the chest. Stable right chest porta cath. Stable lung volumes. Pulmonary staple line re - demonstrated about the right hilum. Chronic increased bilateral interstitial opacity. No pneumothorax. No definite pleural effusion. No acute pulmonary opacity identified. Stable cardiac size and  mediastinal contours. Upper abdominal biliary drainage catheters re - demonstrated. Negative visible bowel gas pattern. Chronic postoperative changes to the proximal left humerus. No acute osseous abnormality identified. IMPRESSION: 1. Chronic lung disease with Pulmonary fibrosis. No superimposed acute findings are identified. 2. Upper abdominal biliary drainage catheters re - demonstrated. Electronically Signed   By: Genevie Ann M.D.   On: 03/16/2017 09:54   Ct Angio Chest Pe W And/or Wo Contrast  Result Date: 03/16/2017 CLINICAL DATA:  Sarcoidosis. Shortness of breath. Difficulty breathing. EXAM: CT ANGIOGRAPHY CHEST WITH CONTRAST TECHNIQUE: Multidetector CT imaging of the chest was performed using the standard protocol during bolus administration of intravenous contrast. Multiplanar CT image reconstructions and MIPs were obtained to evaluate the vascular anatomy. CONTRAST:  73m ISOVUE-370 IOPAMIDOL (ISOVUE-370) INJECTION 76% COMPARISON:  Plain film earlier today.  Chest CT 02/24/2017 FINDINGS: Cardiovascular: No filling defects in the pulmonary arteries to suggest pulmonary emboli. Heart is upper limits normal size. Aorta  is normal caliber. Mediastinum/Nodes: Mild mediastinal adenopathy, slightly more prominent when compared to prior study. Prevascular lymph node has a short axis diameter of 10 mm. Precarinal lymph node has a short axis diameter of 13 mm compared to 11 mm previously. No hilar or axillary adenopathy. Thyroid has a a enlarged, heterogeneous appearance. The previously measured thyroid nodules are not as well visualized on today's study. Lungs/Pleura: Scarring and fibrosis in the lung bases, stable. Increasing ground-glass opacities in the upper lobes. This may be related to air trapping in the lucent areas. No effusions. Calcified bilateral pleural plaques noted. Upper Abdomen: Biliary stents are again noted in place, unchanged. Musculoskeletal: Chest wall soft tissues are unremarkable. No acute  bony abnormality. Review of the MIP images confirms the above findings. IMPRESSION: No evidence of pulmonary embolus. Mildly prominent mediastinal lymph nodes, slightly increased in size since prior study. Ground-glass opacities in the upper lobes are noted. This may be related to air trapping in the lucent areas. Bibasilar fibrosis.  Calcified pleural plaques. Electronically Signed   By: Rolm Baptise M.D.   On: 03/16/2017 11:18     IMPRESSION AND PLAN:   76 year old female with past medical history of COPD, history of lupus, history of colitis, GERD, diabetes, depression, cholangiocarcinoma currently undergoing chemotherapy who presents to the hospital due to shortness of breath.  1. COPD exacerbation-this is the cause of patient's shortness of breath and hypoxemia. CT chest showing no evidence of pneumonia but chronic scarring and groundglass opacities consistent with pulmonary fibrosis. -We'll treat the patient with IV steroids, scheduled DuoNeb's, Pulmicort nebs. -Patient is on oxygen at home at bedtime and will need to be assessed for oxygen continuously.  2. Essential hypertension-continue Norvasc.  3. Diabetes type 2 without complication-continue metformin.  4. History of lupus-continue Plaquenil. No acute flare. Next  5. Neuropathy-continue gabapentin.  6. GERD-continue Protonix.  7. History of cholangiocarcinoma currently undergoing chemotherapy-patient got her last chemotherapy about a week ago and scheduled to get another chemotherapy next week. Follows up with Dr. Rogue Bussing.   8. Depression-continue Effexor.  Possible discharge Home tomorrow.     All the records are reviewed and case discussed with ED provider. Management plans discussed with the patient, family and they are in agreement.  CODE STATUS: DNR  TOTAL TIME TAKING CARE OF THIS PATIENT: 45 minutes.    Henreitta Leber M.D on 03/16/2017 at 4:00 PM  Between 7am to 6pm - Pager - [phone]  After 6pm go  to [email or web address] - password [email or web address] [facility]  Office  [phone]  CC: Primary care physician; Idelle Crouch, MD

## 2017-03-17 DIAGNOSIS — I10 Essential (primary) hypertension: Secondary | ICD-10-CM | POA: Diagnosis not present

## 2017-03-17 DIAGNOSIS — C221 Intrahepatic bile duct carcinoma: Secondary | ICD-10-CM | POA: Diagnosis not present

## 2017-03-17 DIAGNOSIS — L930 Discoid lupus erythematosus: Secondary | ICD-10-CM | POA: Diagnosis not present

## 2017-03-17 DIAGNOSIS — J441 Chronic obstructive pulmonary disease with (acute) exacerbation: Secondary | ICD-10-CM | POA: Diagnosis not present

## 2017-03-17 LAB — BASIC METABOLIC PANEL
ANION GAP: 11 (ref 5–15)
BUN: 15 mg/dL (ref 6–20)
CHLORIDE: 100 mmol/L — AB (ref 101–111)
CO2: 27 mmol/L (ref 22–32)
Calcium: 8.6 mg/dL — ABNORMAL LOW (ref 8.9–10.3)
Creatinine, Ser: 0.57 mg/dL (ref 0.44–1.00)
GFR calc non Af Amer: >60 mL/min (ref >60)
Glucose, Bld: 174 mg/dL — ABNORMAL HIGH (ref 65–99)
POTASSIUM: 3.5 mmol/L (ref 3.5–5.1)
SODIUM: 138 mmol/L (ref 135–145)

## 2017-03-17 LAB — CBC
HEMATOCRIT: 26.3 % — AB (ref 35.0–47.0)
HEMOGLOBIN: 8.6 g/dL — AB (ref 12.0–16.0)
MCH: 33.2 pg (ref 26.0–34.0)
MCHC: 32.6 g/dL (ref 32.0–36.0)
MCV: 101.8 fL — AB (ref 80.0–100.0)
Platelets: 180 10*3/uL (ref 150–440)
RBC: 2.58 MIL/uL — AB (ref 3.80–5.20)
RDW: 18.2 % — ABNORMAL HIGH (ref 11.5–14.5)
WBC: 6.8 10*3/uL (ref 3.6–11.0)

## 2017-03-17 LAB — GLUCOSE, CAPILLARY
GLUCOSE-CAPILLARY: 154 mg/dL — AB (ref 65–99)
Glucose-Capillary: 165 mg/dL — ABNORMAL HIGH (ref 65–99)

## 2017-03-17 MED ORDER — INSULIN ASPART 100 UNIT/ML ~~LOC~~ SOLN
0.0000 [IU] | Freq: Three times a day (TID) | SUBCUTANEOUS | Status: DC
  Administered 2017-03-17 (×2): 4 [IU] via SUBCUTANEOUS
  Filled 2017-03-17 (×2): qty 1

## 2017-03-17 MED ORDER — PREDNISONE 50 MG PO TABS: 50.0000 mg | ORAL_TABLET | Freq: Every day | ORAL | 0 refills | Status: AC

## 2017-03-17 MED ORDER — INSULIN GLARGINE 100 UNIT/ML ~~LOC~~ SOLN: 5.0000 [IU] | Freq: Every day | SUBCUTANEOUS | Status: DC

## 2017-03-17 NOTE — Care Management Obs Status (Signed)
[facility] NOTIFICATION   Patient Details  Name: Regina Hood MRN: [id number] Date of Birth: 16-Nov-1940   Medicare Observation Status Notification Given:  Yes    Shelbie Ammons, RN 03/17/2017, 8:27 AM

## 2017-03-17 NOTE — Progress Notes (Signed)
Pt is being discharged home. Discharge papers given and explained to pt. Pt verbalized understanding. Meds and f/u appointments reviewed. RX given. Awaiting transportation.

## 2017-03-17 NOTE — Care Management Note (Signed)
Case Management Note  Patient Details  Name: Regina Hood MRN: [id number] Date of Birth: 01/02/1941  Subjective/Objective:        Admitted to [facility] under observation status with the diagnosis of COPD. Lives alone. Son is Elta [place] [phone]). Prescriptions are filled at [facility]. No home Health. No skilled facility. Home oxygen x 2 years per [employer]. Uses 2 liters per nasal cannula at night only. No other medical equipment in the home. Takes care of all basic and instrumental activities of daily living herself, drives. Fell last Saturday. Good appetite. Biliary Tube in place since August 2018.            Action/Plan: No discharge needs identified at this time. Will continue to follow  Expected Discharge Date:                  Expected Discharge Plan:     In-House Referral:     Discharge planning Services     Post Acute Care Choice:    Choice offered to:     DME Arranged:    DME Agency:     HH Arranged:    HH Agency:     Status of Service:     If discussed at [employer] of Stay Meetings, dates discussed:    Additional Comments:  Shelbie Ammons, RN MSN CCM Care Management [phone] 03/17/2017, 8:28 AM

## 2017-03-17 NOTE — Progress Notes (Signed)
SATURATION QUALIFICATIONS: (This note is used to comply with regulatory documentation for home oxygen)  Patient Saturations on Room Air at Rest = 94%  Patient Saturations on Room Air while Ambulating = 87%  Patient Saturations on 0 Liters of oxygen while Ambulating = 87%  Please briefly explain why patient needs home oxygen: O2 sats 87% on RA while ambulating. HR up to 117. O2 sats up within seconds to 91% at rest, but HR remains elevated at 115.

## 2017-03-17 NOTE — Plan of Care (Signed)
  Education: Knowledge of General Education information will improve 03/17/2017 0223 - Progressing by Jeffie Pollock, RN   Health Behavior/Discharge Planning: Ability to manage health-related needs will improve 03/17/2017 0223 - Progressing by Jeffie Pollock, RN   Clinical Measurements: Ability to maintain clinical measurements within normal limits will improve 03/17/2017 0223 - Progressing by Jeffie Pollock, RN   Clinical Measurements: Will remain free from infection 03/17/2017 0223 - Progressing by Jeffie Pollock, RN   Clinical Measurements: Diagnostic test results will improve 03/17/2017 0223 - Progressing by Jeffie Pollock, RN   Clinical Measurements: Respiratory complications will improve 03/17/2017 0223 - Progressing by Jeffie Pollock, RN   Clinical Measurements: Cardiovascular complication will be avoided 03/17/2017 0223 - Progressing by Jeffie Pollock, RN   Activity: Risk for activity intolerance will decrease 03/17/2017 0223 - Progressing by Jeffie Pollock, RN   Nutrition: Adequate nutrition will be maintained 03/17/2017 0223 - Progressing by Jeffie Pollock, RN   Coping: Level of anxiety will decrease 03/17/2017 0223 - Progressing by Jeffie Pollock, RN   Elimination: Will not experience complications related to bowel motility 03/17/2017 0223 - Progressing by Jeffie Pollock, RN   Pain Managment: General experience of comfort will improve 03/17/2017 0223 - Progressing by Jeffie Pollock, RN   Safety: Ability to remain free from injury will improve 03/17/2017 0223 - Progressing by Jeffie Pollock, RN   Skin Integrity: Risk for impaired skin integrity will decrease 03/17/2017 0223 - Progressing by Jeffie Pollock, RN

## 2017-03-17 NOTE — Discharge Summary (Signed)
[facility] NAME: Regina Hood    MR#:  [id number]  DATE OF BIRTH:  Mar 13, 1941  DATE OF ADMISSION:  03/16/2017 ADMITTING PHYSICIAN: Henreitta Leber, MD  DATE OF DISCHARGE: 03/17/2017 PRIMARY CARE PHYSICIAN: Idelle Crouch, MD    ADMISSION DIAGNOSIS:  Hypoxia [R09.02] COPD exacerbation ([place]) [J44.1]  DISCHARGE DIAGNOSIS:  Active Problems:   COPD exacerbation ([place])   SECONDARY DIAGNOSIS:   Past Medical History:  Diagnosis Date  . Arthritis   . Cancer ([place])   . Cellulitis   . Collagen vascular disease ([place])   . COPD (chronic obstructive pulmonary disease) ([place])   . DDD (degenerative disc disease), lumbar   . Depression   . Diabetes mellitus   . Diverticulitis   . GERD (gastroesophageal reflux disease)   . GI bleed   . Headache(784.0)   . Hypertension   . Lupus   . Ulcerative colitis [facility])     HOSPITAL COURSE:   76 year old female with past medical history of COPD, history of lupus, history of colitis, GERD, diabetes, depression, cholangiocarcinoma currently undergoing chemotherapy who presents to the hospital due to shortness of breath.  1. Acute on chronic hypoxic respiratory failure due to COPD exacerbation:  Patient is on oxygen at night however not during the daytime. She presented with COPD exacerbation. CT of the chest did not show evidence of pneumonia or congestive heart failure. Her symptoms have improved. She has no wheezing on examination at the time of discharge. She will be discharged on oral steroids. She will also need 24-hour continues oxygen for now.   2. Essential hypertension-continue Norvasc.  3. Diabetes type 2 without complication-continue metformin and ADA diet  4. History of lupus-continue Plaquenil.  5. Neuropathy-continue gabapentin.  6. GERD-continue Protonix.  7. History of cholangiocarcinoma currently undergoing chemotherapy- Follows up with Dr. Rogue Bussing.   8.  Depression-continue Effexor.     DISCHARGE CONDITIONS AND DIET:   Stable for discharge on diabetic diet  CONSULTS OBTAINED:    DRUG ALLERGIES:   Allergies  Allergen Reactions  . Feldene [Piroxicam]   . Penicillamine Other (See Comments)  . Qualaquin [Quinine]     DISCHARGE MEDICATIONS:   Current Discharge Medication List    START taking these medications   Details  predniSONE (DELTASONE) 50 MG tablet Take 1 tablet (50 mg total) by mouth daily with breakfast for 4 days. Qty: 4 tablet, Refills: 0      CONTINUE these medications which have NOT CHANGED   Details  amLODipine (NORVASC) 5 MG tablet Take 5 mg by mouth daily.    Cyanocobalamin (VITAMIN B-12 PO) Take 1,000 mcg by mouth daily.    dexamethasone (DECADRON) 1 MG tablet Take 3 tablets (3 mg total) by mouth daily. Qty: 90 tablet, Refills: 3   Associated Diagnoses: Cholangiocarcinoma (HCC)    estradiol (ESTRACE) 1 MG tablet Take 1 mg by mouth daily.    gabapentin (NEURONTIN) 100 MG capsule Take 100 mg by mouth 3 (three) times daily.     hydroxychloroquine (PLAQUENIL) 200 MG tablet Take 200 mg by mouth 2 (two) times daily.     metFORMIN (GLUCOPHAGE) 500 MG tablet Take 500 mg by mouth 2 (two) times daily with a meal.    oxyCODONE (OXY IR/ROXICODONE) 5 MG immediate release tablet Take 1 tablet (5 mg total) by mouth every 6 (six) hours as needed. Qty: 120 tablet, Refills: 0    pantoprazole (PROTONIX) 40 MG tablet Take 40 mg by mouth  daily.    potassium chloride SA (K-DUR,KLOR-CON) 20 MEQ tablet Take 1 tablet (20 mEq total) by mouth daily as needed (low potassium). Qty: 30 tablet, Refills: 0    senna (SENOKOT) 8.6 MG TABS tablet Take 2 tablets (17.2 mg total) by mouth daily. Hold for loose stools. Qty: 120 each, Refills: 3   Associated Diagnoses: Cholangiocarcinoma ([place])    venlafaxine XR (EFFEXOR-XR) 75 MG 24 hr capsule Take 225 mg by mouth daily.     zolpidem (AMBIEN) 10 MG tablet Take 1 tablet (10 mg  total) by mouth at bedtime as needed. Qty: 30 tablet, Refills: 3   Associated Diagnoses: Cholangiocarcinoma ([place]); Insomnia, unspecified type    acetaminophen (TYLENOL) 500 MG tablet Take 1,000 mg by mouth 3 (three) times daily.    calcium carbonate 100 mg/ml SUSP Take by mouth.    Elastic Bandages & Supports (MEDICAL COMPRESSION STOCKINGS) MISC 2 each by Does not apply route daily. Qty: 2 each, Refills: 1    Fluticasone-Salmeterol (ADVAIR DISKUS) 250-50 MCG/DOSE AEPB Inhale into the lungs.    lidocaine-prilocaine (EMLA) cream Apply 1 application topically as needed. Qty: 30 g, Refills: 3   Associated Diagnoses: Port-A-Cath in place    LORazepam (ATIVAN) 0.5 MG tablet Take 0.5 mg every 6 (six) hours as needed by mouth. Refills: 0    ondansetron (ZOFRAN) 8 MG tablet Take 1 tablet (8 mg total) by mouth every 12 (twelve) hours as needed. Qty: 30 tablet, Refills: 3   Associated Diagnoses: Cholangiocarcinoma ([place]); Chemotherapy induced nausea and vomiting    polyethylene glycol powder (GLYCOLAX/MIRALAX) powder     prochlorperazine (COMPAZINE) 10 MG tablet Take 1 tablet (10 mg total) by mouth every 6 (six) hours as needed. Qty: 30 tablet, Refills: 3   Associated Diagnoses: Cholangiocarcinoma ([place]); Chemotherapy induced nausea and vomiting    Sodium Chloride Flush (NORMAL SALINE FLUSH) 0.9 % SOLN 1 mL by Other route every 8 (eight) hours. Flush each biliary drain with 1 mL twice daily Qty: 84 Syringe, Refills: 3   Associated Diagnoses: Cholangiocarcinoma (HCC)    triamcinolone ointment (KENALOG) 0.1 % Apply 1 application topically 2 (two) times daily. Qty: 80 g, Refills: 0      STOP taking these medications     azelastine (ASTELIN) 0.1 % nasal spray           Today   CHIEF COMPLAINT:  Patient will like to go home this morning. She reports that she feels much better. Denies wheezing or shortness of breath.   VITAL SIGNS:  Blood pressure 135/71, pulse (!) 116, temperature  97.8 F (36.6 C), temperature source Oral, resp. rate 18, height 5' 2"  (1.575 m), weight 71.2 kg (157 lb), SpO2 99 %.   REVIEW OF SYSTEMS:  Review of Systems  Constitutional: Negative.  Negative for chills, fever and malaise/fatigue.  HENT: Negative.  Negative for ear discharge, ear pain, hearing loss, nosebleeds and sore throat.   Eyes: Negative.  Negative for blurred vision and pain.  Respiratory: Positive for cough. Negative for hemoptysis, shortness of breath (better) and wheezing.   Cardiovascular: Negative.  Negative for chest pain, palpitations and leg swelling.  Gastrointestinal: Negative.  Negative for abdominal pain, blood in stool, diarrhea, nausea and vomiting.  Genitourinary: Negative.  Negative for dysuria.  Musculoskeletal: Negative.  Negative for back pain.  Skin: Negative.   Neurological: Negative for dizziness, tremors, speech change, focal weakness, seizures and headaches.  Endo/Heme/Allergies: Negative.  Does not bruise/bleed easily.  Psychiatric/Behavioral: Negative.  Negative for depression, hallucinations  and suicidal ideas.     PHYSICAL EXAMINATION:  GENERAL:  76 y.o.-year-old patient lying in the bed with no acute distress.  NECK:  Supple, no jugular venous distention. No thyroid enlargement, no tenderness.  LUNGS: Normal breath sounds bilaterally, no wheezing, rales,rhonchi  No use of accessory muscles of respiration.  CARDIOVASCULAR: S1, S2 normal. No murmurs, rubs, or gallops.  ABDOMEN: Soft, non-tender, non-distended. Bowel sounds present. No organomegaly or mass.  EXTREMITIES: No pedal edema, cyanosis, or clubbing.  PSYCHIATRIC: The patient is alert and oriented x 3.  SKIN: No obvious rash, lesion, or ulcer.   DATA REVIEW:   CBC Recent Labs  Lab 03/17/17 0417  WBC 6.8  HGB 8.6*  HCT 26.3*  PLT 180    Chemistries  Recent Labs  Lab 03/16/17 0913 03/17/17 0417  NA 137 138  K 3.6 3.5  CL 99* 100*  CO2 26 27  GLUCOSE 151* 174*  BUN 11 15   CREATININE 0.57 0.57  CALCIUM 8.4* 8.6*  AST 60*  --   ALT 33  --   ALKPHOS 292*  --   BILITOT 0.8  --     Cardiac Enzymes Recent Labs  Lab 03/16/17 0913  TROPONINI 0.03*    Microbiology Results  @[email or web address] @  RADIOLOGY:  Dg Chest 2 View  Result Date: 03/16/2017 CLINICAL DATA:  76 year old female with increased cough and shortness of breath. Undergoing chemotherapy for cholangiocarcinoma. Pulmonary fibrosis. EXAM: CHEST  2 VIEW COMPARISON:  Restaging CT chest abdomen and pelvis 02/24/2017 and earlier. FINDINGS: Seated AP and lateral views of the chest. Stable right chest porta cath. Stable lung volumes. Pulmonary staple line re - demonstrated about the right hilum. Chronic increased bilateral interstitial opacity. No pneumothorax. No definite pleural effusion. No acute pulmonary opacity identified. Stable cardiac size and mediastinal contours. Upper abdominal biliary drainage catheters re - demonstrated. Negative visible bowel gas pattern. Chronic postoperative changes to the proximal left humerus. No acute osseous abnormality identified. IMPRESSION: 1. Chronic lung disease with Pulmonary fibrosis. No superimposed acute findings are identified. 2. Upper abdominal biliary drainage catheters re - demonstrated. Electronically Signed   By: Genevie Ann M.D.   On: 03/16/2017 09:54   Ct Angio Chest Pe W And/or Wo Contrast  Result Date: 03/16/2017 CLINICAL DATA:  Sarcoidosis. Shortness of breath. Difficulty breathing. EXAM: CT ANGIOGRAPHY CHEST WITH CONTRAST TECHNIQUE: Multidetector CT imaging of the chest was performed using the standard protocol during bolus administration of intravenous contrast. Multiplanar CT image reconstructions and MIPs were obtained to evaluate the vascular anatomy. CONTRAST:  35m ISOVUE-370 IOPAMIDOL (ISOVUE-370) INJECTION 76% COMPARISON:  Plain film earlier today.  Chest CT 02/24/2017 FINDINGS: Cardiovascular: No filling defects in the pulmonary arteries to suggest  pulmonary emboli. Heart is upper limits normal size. Aorta is normal caliber. Mediastinum/Nodes: Mild mediastinal adenopathy, slightly more prominent when compared to prior study. Prevascular lymph node has a short axis diameter of 10 mm. Precarinal lymph node has a short axis diameter of 13 mm compared to 11 mm previously. No hilar or axillary adenopathy. Thyroid has a a enlarged, heterogeneous appearance. The previously measured thyroid nodules are not as well visualized on today's study. Lungs/Pleura: Scarring and fibrosis in the lung bases, stable. Increasing ground-glass opacities in the upper lobes. This may be related to air trapping in the lucent areas. No effusions. Calcified bilateral pleural plaques noted. Upper Abdomen: Biliary stents are again noted in place, unchanged. Musculoskeletal: Chest wall soft tissues are unremarkable. No acute bony abnormality. Review of the MIP  images confirms the above findings. IMPRESSION: No evidence of pulmonary embolus. Mildly prominent mediastinal lymph nodes, slightly increased in size since prior study. Ground-glass opacities in the upper lobes are noted. This may be related to air trapping in the lucent areas. Bibasilar fibrosis.  Calcified pleural plaques. Electronically Signed   By: Rolm Baptise M.D.   On: 03/16/2017 11:18      Current Discharge Medication List    START taking these medications   Details  predniSONE (DELTASONE) 50 MG tablet Take 1 tablet (50 mg total) by mouth daily with breakfast for 4 days. Qty: 4 tablet, Refills: 0      CONTINUE these medications which have NOT CHANGED   Details  amLODipine (NORVASC) 5 MG tablet Take 5 mg by mouth daily.    Cyanocobalamin (VITAMIN B-12 PO) Take 1,000 mcg by mouth daily.    dexamethasone (DECADRON) 1 MG tablet Take 3 tablets (3 mg total) by mouth daily. Qty: 90 tablet, Refills: 3   Associated Diagnoses: Cholangiocarcinoma (HCC)    estradiol (ESTRACE) 1 MG tablet Take 1 mg by mouth daily.     gabapentin (NEURONTIN) 100 MG capsule Take 100 mg by mouth 3 (three) times daily.     hydroxychloroquine (PLAQUENIL) 200 MG tablet Take 200 mg by mouth 2 (two) times daily.     metFORMIN (GLUCOPHAGE) 500 MG tablet Take 500 mg by mouth 2 (two) times daily with a meal.    oxyCODONE (OXY IR/ROXICODONE) 5 MG immediate release tablet Take 1 tablet (5 mg total) by mouth every 6 (six) hours as needed. Qty: 120 tablet, Refills: 0    pantoprazole (PROTONIX) 40 MG tablet Take 40 mg by mouth daily.    potassium chloride SA (K-DUR,KLOR-CON) 20 MEQ tablet Take 1 tablet (20 mEq total) by mouth daily as needed (low potassium). Qty: 30 tablet, Refills: 0    senna (SENOKOT) 8.6 MG TABS tablet Take 2 tablets (17.2 mg total) by mouth daily. Hold for loose stools. Qty: 120 each, Refills: 3   Associated Diagnoses: Cholangiocarcinoma ([place])    venlafaxine XR (EFFEXOR-XR) 75 MG 24 hr capsule Take 225 mg by mouth daily.     zolpidem (AMBIEN) 10 MG tablet Take 1 tablet (10 mg total) by mouth at bedtime as needed. Qty: 30 tablet, Refills: 3   Associated Diagnoses: Cholangiocarcinoma ([place]); Insomnia, unspecified type    acetaminophen (TYLENOL) 500 MG tablet Take 1,000 mg by mouth 3 (three) times daily.    calcium carbonate 100 mg/ml SUSP Take by mouth.    Elastic Bandages & Supports (MEDICAL COMPRESSION STOCKINGS) MISC 2 each by Does not apply route daily. Qty: 2 each, Refills: 1    Fluticasone-Salmeterol (ADVAIR DISKUS) 250-50 MCG/DOSE AEPB Inhale into the lungs.    lidocaine-prilocaine (EMLA) cream Apply 1 application topically as needed. Qty: 30 g, Refills: 3   Associated Diagnoses: Port-A-Cath in place    LORazepam (ATIVAN) 0.5 MG tablet Take 0.5 mg every 6 (six) hours as needed by mouth. Refills: 0    ondansetron (ZOFRAN) 8 MG tablet Take 1 tablet (8 mg total) by mouth every 12 (twelve) hours as needed. Qty: 30 tablet, Refills: 3   Associated Diagnoses: Cholangiocarcinoma ([place]); Chemotherapy  induced nausea and vomiting    polyethylene glycol powder (GLYCOLAX/MIRALAX) powder     prochlorperazine (COMPAZINE) 10 MG tablet Take 1 tablet (10 mg total) by mouth every 6 (six) hours as needed. Qty: 30 tablet, Refills: 3   Associated Diagnoses: Cholangiocarcinoma ([place]); Chemotherapy induced nausea and vomiting  Sodium Chloride Flush (NORMAL SALINE FLUSH) 0.9 % SOLN 1 mL by Other route every 8 (eight) hours. Flush each biliary drain with 1 mL twice daily Qty: 84 Syringe, Refills: 3   Associated Diagnoses: Cholangiocarcinoma (HCC)    triamcinolone ointment (KENALOG) 0.1 % Apply 1 application topically 2 (two) times daily. Qty: 80 g, Refills: 0      STOP taking these medications     azelastine (ASTELIN) 0.1 % nasal spray             Management plans discussed with the patient and she is in agreement. Stable for discharge   Patient should follow up with pcp  CODE STATUS:     Code Status Orders  (From admission, onward)        Start     Ordered   03/16/17 1712  Do not attempt resuscitation (DNR)  Continuous    Question Answer Comment  In the event of cardiac or respiratory ARREST Do not call a "code blue"   In the event of cardiac or respiratory ARREST Do not perform Intubation, CPR, defibrillation or ACLS   In the event of cardiac or respiratory ARREST Use medication by any route, position, wound care, and other measures to relive pain and suffering. May use oxygen, suction and manual treatment of airway obstruction as needed for comfort.      03/16/17 1711    Code Status History    Date Active Date Inactive Code Status Order ID Comments User Context   11/13/2016 03:01 11/14/2016 14:12 DNR [id number]  Harrie Foreman, MD Inpatient   06/29/2016 15:33 07/01/2016 19:22 DNR [id number]  Hillary Bow, MD ED   09/17/2011 14:43 09/19/2011 16:24 Full Code [id number]  Cristi Loron, RN Inpatient    Advance Directive Documentation     Most Recent Value  Type of Advance  Directive  Healthcare Power of Attorney  Pre-existing out of facility DNR order (yellow form or pink MOST form)  No data  "MOST" Form in Place?  No data      TOTAL TIME TAKING CARE OF THIS PATIENT: 38 minutes.    Note: This dictation was prepared with Dragon dictation along with smaller phrase technology. Any transcriptional errors that result from this process are unintentional.  Sheron Tallman M.D on 03/17/2017 at 9:47 AM  Between 7am to 6pm - Pager - [phone] After 6pm go to [email or web address] - password [email or web address] [facility]  Office  [phone]  CC: Primary care physician; Idelle Crouch, MD

## 2017-03-21 NOTE — Progress Notes (Addendum)
Hematology/Oncology Follow up note [facility] Telephone:([phone]) [phone] [phone]) [phone]  Patient Care Team: Idelle Crouch, MD as PCP - General (Internal Medicine) REASON FOR VISIT Follow up for treatment of cholangiocarcinoma  PERTINENT ONCOLOGY HISTORY Regina Hood 76 y.o.  female with PMH listed as below, most significantly for cholangiocarcinoma who has been on chemotherapy treatment presents to transfer her cancer care to our cancer center.  After extensive medical record review, summary of her diagnosis and treatment history are listed below.  1. Initially presented in 07/2016 to an outside ED with lower and upper GI bleed.  A. Regina Hood was found to have new LFTs elevations including AST 340, ALT 379, Alk phos 420 and t bili 0.9.   B. [place] was obtained, which did not visualize the left lobe.  C. Regina Hood was evaluated by gastroenterology because of her history of UC and underwent colonoscopy, which was unremarkable per path report.  2. 08/20/16, CT noted diffuse atrophy of the left lobe with left intrahepatic biliary ductal dilatation, with 4.1 cm ill-defined low-attenuation mass. This mass was suspicious for malignancy.  3. 08/2016, MRI/MRCP which showed abnormal left hepatic lobe with diffuse biliary dilatation with a shrunken left hepatic lobe and a 2.42.9 cm oval-shaped masslike component displacing the adjacent bile ducts. There is an approximately 1.6 cm segment truncated bile ducts involving common hepatic duct and right and left hepatic although no dilatation of the right hepatic duct system was seen.   4. 09/02/16, ERCP showed localized biliary stricture in the left intrahepatic duct with dilation of the left intrahepatic branches. Sphincterotomy was performed and a stent was placed. EUS with a 3.3 cm mass and left duct dilation.  5. Repeat ERCP was done 09/10/16, during which the stent was exchanged.  6. 09/20/16, taken to OR and noted to a have an peritoneal  implant with biopsy c/w adenocarcinoma. The neoplastic cells are positive for CK7. They are negative for CK20, CDX-2, TTF-1, Napsin A, hep-par1, glypican 3, and arginase. The immunohistochemical features are consistent with adenocarcinoma of upper gastrointestinal tract or pancreaticobiliary origin.  7. 09/28/16, initially seen in medical oncology clinic by Dr. Filomena Jungling 8. 09/29/16, admitted for biliary obstruction and cholangitis, s/p PBD placement, d/c 10/06/16 9. 10/12/16, initiated on gemcitabine (400 mg/m2). CA 19-9 167 10. 10/26/16, increased gemcitabine (800 mg/m2) 11. 11/23/2016 Cycle 3 Gemzar (874m/m2), 12/07/2016, Gemzar (8044mm2) added cisplatin 2553m2  12. 12/14/2016, CT CAP shows slight interval decrease in size of L hepatic lobe cholangio. Slight interval increase in size of porta hepatis lymph node whiel other nodes are slightly smaller. Multiple bilateral thyroid nodules. Stable mediastinal lymph nodes. CA 19-9 60 14  8/30 Cycle 4 Gemzar (800m46m) added cisplatin 20mg48m 15, 12/28/2016 Gemcitabine 800mg/3mcisplatin was hold due to anemia. S/p 1 PRBC transfusion.   [facility]   [facility] to [facility] [place]    Regina Hood forgot her appointment on day 8 and Gemcitabine 800mg/m61md cisplatin 20mg/m233m given  Day 9 of cycle 4 (01/05/2017).        01/19/2017  Cycle 5 Day 1Gemcitabine 800mg/m2,26mplatin 20mg/m2, 69munit blood transfusion.        01/26/2017 Cycle 5 Day 8 Gemcitabine 800mg/m2, c11matin 20mg/m2   185me has been on Dexamethasone 4mg daily fo18mkin rash since this [facility] 9/242m018. Recently decreased to 1 mg daily due to hyperglycemia.  INTERVAL HISTORY  Regina Hood 76 y.o.  female with above history reviewed by me today who presents for evaluation prior to cycle 8 Day 1  Gemcitabine. During the interval Regina Hood was admitted to the hospital due to acute shortness of breath. CT chest ruled out pulmonary embolism or  pneumonia. Patient received steroid and was sent home with home oxygen plus a course of steroid taper which Regina Hood finished.  Patient reports feeling fatigue and generalized weakness. Regina Hood lives by herself, able to do some house chore however Regina Hood reports "I have to drag myself to do staff). Her rash has improved after her recent admission. Appetite is good. Regina Hood takes dexamethasone 1 mg daily. Regina Hood still continued to have shortness of breath with exertion. Regina Hood did not, with her home oxygen today to the clinic. When Regina Hood walks into the examination room, Regina Hood was feeling very shortness of breath. Oxygen saturation was 90% at rest on room air. Reports having a mouth sore on the right side of her tongue.  Review of Systems  Constitutional: Positive for fatigue. Negative for appetite change, chills, fever and unexpected weight change.  HENT:   Positive for mouth sores. Negative for hearing loss and lump/mass.   Eyes: Negative for eye problems.  Respiratory: Negative for chest tightness and cough.   Cardiovascular: Negative for chest pain and leg swelling.  Gastrointestinal: Negative for abdominal distention and blood in stool.  Endocrine: Negative for hot flashes.  Genitourinary: Negative for difficulty urinating and dysuria.   Musculoskeletal: Negative for arthralgias and gait problem.  Skin: Positive for rash. Negative for itching.       Skin inflammation at the suture site of percutaneous bile ducts draining.  Neurological: Negative for dizziness and gait problem.  Hematological: Negative for adenopathy. Does not bruise/bleed easily.  Psychiatric/Behavioral: Negative for confusion. The patient is not nervous/anxious.    MEDICAL HISTORY:  Past Medical History:  Diagnosis Date  . Arthritis   . Cancer ([place])   . Cellulitis   . Collagen vascular disease ([place])   . COPD (chronic obstructive pulmonary disease) ([place])   . DDD (degenerative disc disease), lumbar   . Depression   . Diabetes mellitus   .  Diverticulitis   . GERD (gastroesophageal reflux disease)   . GI bleed   . Headache(784.0)   . Hypertension   . Lupus   . Ulcerative colitis ([place])     SURGICAL HISTORY: Past Surgical History:  Procedure Laterality Date  . ABDOMINAL HYSTERECTOMY    . BREAST BIOPSY Right   . COLONOSCOPY    . COLONOSCOPY WITH PROPOFOL N/A 08/18/2016   Procedure: COLONOSCOPY WITH PROPOFOL;  Surgeon: Manya Silvas, MD;  Location: [facility] ENDOSCOPY;  Service: Endoscopy;  Laterality: N/A;  . ESOPHAGOGASTRODUODENOSCOPY (EGD) WITH PROPOFOL N/A 08/18/2016   Procedure: ESOPHAGOGASTRODUODENOSCOPY (EGD) WITH PROPOFOL;  Surgeon: Manya Silvas, MD;  Location: [facility] ENDOSCOPY;  Service: Endoscopy;  Laterality: N/A;  . ORIF HUMERUS FRACTURE  09/17/2011   Procedure: OPEN REDUCTION INTERNAL FIXATION (ORIF) PROXIMAL HUMERUS FRACTURE;  Surgeon: Augustin Schooling, MD;  Location: [place];  Service: Orthopedics;  Laterality: Left;    SOCIAL HISTORY: Social History   Socioeconomic History  . Marital status: Widowed    Spouse name: Not on file  . Number of children: Not on file  . Years of education: Not on file  . Highest education level: Not on file  Social Needs  . Financial resource strain: Not on file  . Food insecurity - worry: Not on file  .  Food insecurity - inability: Not on file  . Transportation needs - medical: Not on file  . Transportation needs - non-medical: Not on file  Occupational History  . Not on file  Tobacco Use  . Smoking status: Former Smoker    Packs/day: 1.00    Years: 10.00    Pack years: 10.00    Types: Cigarettes    Last attempt to quit: 12/30/1998    Years since quitting: 18.2  . Smokeless tobacco: Never Used  . Tobacco comment: quit smoking in 2003  Substance and Sexual Activity  . Alcohol use: No  . Drug use: No  . Sexual activity: Not on file  Other Topics Concern  . Not on file  Social History Narrative  . Not on file    FAMILY HISTORY: Family History  Problem Relation  Age of Onset  . Breast cancer Mother 16  . COPD Father   . Prostate cancer Brother     ALLERGIES:  is allergic to feldene [piroxicam]; penicillamine; and qualaquin [quinine].  MEDICATIONS:  Current Outpatient Medications  Medication Sig Dispense Refill  . acetaminophen (TYLENOL) 500 MG tablet Take 1,000 mg by mouth 3 (three) times daily.    Marland Kitchen amLODipine (NORVASC) 5 MG tablet Take 5 mg by mouth daily.    . calcium carbonate 100 mg/ml SUSP Take by mouth.    . Cyanocobalamin (VITAMIN B-12 PO) Take 1,000 mcg by mouth daily.    Marland Kitchen dexamethasone (DECADRON) 1 MG tablet Take 3 tablets (3 mg total) by mouth daily. (Patient taking differently: Take 1 mg by mouth daily with breakfast. ) 90 tablet 3  . Elastic Bandages & Supports (MEDICAL COMPRESSION STOCKINGS) MISC 2 each by Does not apply route daily. 2 each 1  . estradiol (ESTRACE) 1 MG tablet Take 1 mg by mouth daily.    . Fluticasone-Salmeterol (ADVAIR DISKUS) 250-50 MCG/DOSE AEPB Inhale into the lungs.    . gabapentin (NEURONTIN) 100 MG capsule Take 100 mg by mouth 3 (three) times daily.     . hydroxychloroquine (PLAQUENIL) 200 MG tablet Take 200 mg by mouth 2 (two) times daily.     Marland Kitchen lidocaine-prilocaine (EMLA) cream Apply 1 application topically as needed. 30 g 3  . LORazepam (ATIVAN) 0.5 MG tablet Take 0.5 mg every 6 (six) hours as needed by mouth.  0  . metFORMIN (GLUCOPHAGE) 500 MG tablet Take 500 mg by mouth 2 (two) times daily with a meal.    . ondansetron (ZOFRAN) 8 MG tablet Take 1 tablet (8 mg total) by mouth every 12 (twelve) hours as needed. 30 tablet 3  . oxyCODONE (OXY IR/ROXICODONE) 5 MG immediate release tablet Take 1 tablet (5 mg total) by mouth every 6 (six) hours as needed. 120 tablet 0  . pantoprazole (PROTONIX) 40 MG tablet Take 40 mg by mouth daily.    . polyethylene glycol powder (GLYCOLAX/MIRALAX) powder     . potassium chloride SA (K-DUR,KLOR-CON) 20 MEQ tablet Take 1 tablet (20 mEq total) by mouth daily as needed (low  potassium). 30 tablet 0  . predniSONE (DELTASONE) 50 MG tablet Take 1 tablet (50 mg total) by mouth daily with breakfast for 4 days. 4 tablet 0  . prochlorperazine (COMPAZINE) 10 MG tablet Take 1 tablet (10 mg total) by mouth every 6 (six) hours as needed. 30 tablet 3  . senna (SENOKOT) 8.6 MG TABS tablet Take 2 tablets (17.2 mg total) by mouth daily. Hold for loose stools. 120 each 3  . Sodium Chloride Flush (  NORMAL SALINE FLUSH) 0.9 % SOLN 1 mL by Other route every 8 (eight) hours. Flush each biliary drain with 1 mL twice daily 84 Syringe 3  . triamcinolone ointment (KENALOG) 0.1 % Apply 1 application topically 2 (two) times daily. 80 g 0  . venlafaxine XR (EFFEXOR-XR) 75 MG 24 hr capsule Take 225 mg by mouth daily.     Marland Kitchen zolpidem (AMBIEN) 10 MG tablet Take 1 tablet (10 mg total) by mouth at bedtime as needed. 30 tablet 3   No current facility-administered medications for this visit.       Marland Kitchen  PHYSICAL EXAMINATION: ECOG PERFORMANCE STATUS: 2 - Symptomatic, <50% confined to bed Vitals:   03/23/17 1024 03/23/17 1028  BP: 136/80   Pulse: (!) 101   Resp: 20   Temp: 97.8 F (36.6 C)   SpO2:  90%   Filed Weights   03/23/17 1024  Weight: 153 lb 11.2 oz (69.7 kg)   Physical Exam  Constitutional: Regina Hood is oriented to person, place, and time. No distress.  HENT:  Head: Normocephalic and atraumatic.  Mouth/Throat: No oropharyngeal exudate.  Right side Tongue sore.   Eyes: EOM are normal. Pupils are equal, round, and reactive to light. No scleral icterus.  Neck: Normal range of motion. Neck supple.  Cardiovascular: Normal rate.  No murmur heard. Pulmonary/Chest: Effort normal and breath sounds normal. No respiratory distress. Regina Hood has no wheezes.  Abdominal: Soft. Bowel sounds are normal. Regina Hood exhibits no distension.  Musculoskeletal: Normal range of motion. Regina Hood exhibits edema. Regina Hood exhibits no tenderness.  1+ pitting edema.  Neurological: Regina Hood is oriented to person, place, and time. No  cranial nerve deficit.  Skin: Skin is dry. Rash noted. No pallor.  Psychiatric: Affect and judgment normal.   .    LABORATORY DATA:  I have reviewed the data as listed Lab Results  Component Value Date   WBC 9.9 03/23/2017   HGB 9.4 (L) 03/23/2017   HCT 28.8 (L) 03/23/2017   MCV 99.4 03/23/2017   PLT 197 03/23/2017   Recent Labs    03/02/17 1324 03/09/17 1328 03/16/17 0913 03/17/17 0417  NA 137 135 137 138  K 3.6 3.3* 3.6 3.5  CL 101 99* 99* 100*  CO2 29 27 26 27   GLUCOSE 180* 202* 151* 174*  BUN 13 10 11 15   CREATININE 0.93 0.52 0.57 0.57  CALCIUM 8.7* 8.3* 8.4* 8.6*  GFRNONAA 59* >60 >60 >60  GFRAA >60 >60 >60 >60  PROT 6.3* 6.0* 6.4*  --   ALBUMIN 3.0* 2.8* 3.0*  --   AST 49* 64* 60*  --   ALT 43 43 33  --   ALKPHOS 318* 297* 292*  --   BILITOT 0.8 0.7 0.8  --    [facility] CA 19-9   09/14/16 220 --> 09/28/16 176--> 10/12/16 167-->12/07/2016 64-->12/14/2016 60. --> 12/29/2016 87--> 01/26/2017 99  RADIOGRAPHIC STUDIES: I have personally reviewed the radiological images as listed and agreed with the findings in the report. CT 12/14/2016 [facility] Impression: 1. Slight interval decrease in size of the left hepatic lobe cholangiocarcinoma. Slight interval decrease in size of the hypoattenuating left hepatic lobe lesion which now measures 3.6 x 2.8 cm, previously 3.9 x 2.9 cm 2. Slight interval increase in size of a porta hepatis lymph node. Other nodes are slightly smaller. 3. Multiple bilateral thyroid nodules. Recommend ultrasound for further evaluation. 4. Stable mediastinal lymph nodes.  CT chest abdomen pelvis 02/24/2017 comparing to 5/918 Study in  [facility].  1. No acute findings within the chest, abdomen or pelvis. 2. There has been interval decrease in size of mass associated with left lobe of liver. No specific findings identified to suggest metastatic disease.Mass within the anterior and central aspect of the left lobe appears  decreased in size from previous exam measuring 3.0 x 3.2 cm, 3. Bilateral percutaneous biliary drainage catheters are in place without significant biliary ductal dilatation. 4. Bilateral pleural calcifications and chronic interstitial changes of pulmonary fibrosis. Prominent mediastinal lymph nodes are nonspecific in the setting of interstitial lung disease.   ASSESSMENT & PLAN:  Cancer Staging Cholangiocarcinoma [facility]) Staging form: Intrahepatic Bile Duct, AJCC 8th Edition - Clinical stage from 12/29/2016: Stage IV (cT1, cNX, pM1) - Signed by Earlie Server, MD on 12/29/2016  1. Cholangiocarcinoma ([place])   2. Anemia due to antineoplastic chemotherapy   3. Neoplastic malignant related fatigue   4. Encounter for antineoplastic chemotherapy   5. Shortness of breath   6. Mouth sore    # Will hold today's gemcitabine due to profound weakness.  # SOB, PE and PNA were ruled out. Likely lung sarcoidosis flare. Plan to go up on the dose of Dexamethasone to 60m daily.    # Macrocytic anemia, possibly chemotherapy related.. BY17and folic level pending. Hb improved to 9.6, after procrit use.      # Mouth sore: possible HSV infection. Will give her a trial of Valtrex 1024mBID x 7 days. Magic mouth wash swish and spit.  # Constipation due to Opiod: controlled. continue Sennakot 2 tabs daily. Marilax PRN. Regina Hood has daily BM.   # Skin rash/lupus, follows up with Rhematology. Rash is better, likely due to the IV steroid dose Regina Hood got during her last admission.       # hyperglycemia, continue monitor. On Metformin 50013mID, may need to adjust if glucose continue to increase. Currently stable.   #Hypokalemia, no diarrhea.improved.  # I have previously refer the patient to palliative care medicine for symptom control. # Nutrition consult was discussed with patient and Regina Hood is not interested.   advised advised her to continue drinking boost twice daily.  All questions were answered. The patient knows to call the  clinic with any problems questions or concerns.  Return of visit:  1 week for reevaluation. Lab/MD/Gemcitabine  ZhoEarlie ServerD, PhD Hematology Oncology [facility] [phone]/08/2016

## 2017-03-23 ENCOUNTER — Encounter: Payer: Self-pay | Admitting: Oncology

## 2017-03-23 ENCOUNTER — Inpatient Hospital Stay: Payer: PPO | Attending: Oncology

## 2017-03-23 ENCOUNTER — Inpatient Hospital Stay (HOSPITAL_BASED_OUTPATIENT_CLINIC_OR_DEPARTMENT_OTHER): Payer: PPO | Admitting: Oncology

## 2017-03-23 ENCOUNTER — Inpatient Hospital Stay: Payer: PPO

## 2017-03-23 VITALS — BP 136/80 | HR 101 | Temp 97.8°F | Resp 20 | Wt 153.7 lb

## 2017-03-23 DIAGNOSIS — R531 Weakness: Secondary | ICD-10-CM | POA: Insufficient documentation

## 2017-03-23 DIAGNOSIS — Z862 Personal history of diseases of the blood and blood-forming organs and certain disorders involving the immune mechanism: Secondary | ICD-10-CM

## 2017-03-23 DIAGNOSIS — R21 Rash and other nonspecific skin eruption: Secondary | ICD-10-CM | POA: Insufficient documentation

## 2017-03-23 DIAGNOSIS — R0602 Shortness of breath: Secondary | ICD-10-CM

## 2017-03-23 DIAGNOSIS — F329 Major depressive disorder, single episode, unspecified: Secondary | ICD-10-CM

## 2017-03-23 DIAGNOSIS — Z79899 Other long term (current) drug therapy: Secondary | ICD-10-CM | POA: Insufficient documentation

## 2017-03-23 DIAGNOSIS — Z87891 Personal history of nicotine dependence: Secondary | ICD-10-CM

## 2017-03-23 DIAGNOSIS — R530 Neoplastic (malignant) related fatigue: Secondary | ICD-10-CM | POA: Insufficient documentation

## 2017-03-23 DIAGNOSIS — K1379 Other lesions of oral mucosa: Secondary | ICD-10-CM | POA: Diagnosis not present

## 2017-03-23 DIAGNOSIS — K5190 Ulcerative colitis, unspecified, without complications: Secondary | ICD-10-CM | POA: Diagnosis not present

## 2017-03-23 DIAGNOSIS — J449 Chronic obstructive pulmonary disease, unspecified: Secondary | ICD-10-CM | POA: Diagnosis not present

## 2017-03-23 DIAGNOSIS — I998 Other disorder of circulatory system: Secondary | ICD-10-CM | POA: Insufficient documentation

## 2017-03-23 DIAGNOSIS — Z7984 Long term (current) use of oral hypoglycemic drugs: Secondary | ICD-10-CM | POA: Insufficient documentation

## 2017-03-23 DIAGNOSIS — D6481 Anemia due to antineoplastic chemotherapy: Secondary | ICD-10-CM | POA: Insufficient documentation

## 2017-03-23 DIAGNOSIS — E042 Nontoxic multinodular goiter: Secondary | ICD-10-CM | POA: Diagnosis not present

## 2017-03-23 DIAGNOSIS — I129 Hypertensive chronic kidney disease with stage 1 through stage 4 chronic kidney disease, or unspecified chronic kidney disease: Secondary | ICD-10-CM | POA: Diagnosis not present

## 2017-03-23 DIAGNOSIS — K5900 Constipation, unspecified: Secondary | ICD-10-CM | POA: Insufficient documentation

## 2017-03-23 DIAGNOSIS — M329 Systemic lupus erythematosus, unspecified: Secondary | ICD-10-CM

## 2017-03-23 DIAGNOSIS — T451X5A Adverse effect of antineoplastic and immunosuppressive drugs, initial encounter: Secondary | ICD-10-CM

## 2017-03-23 DIAGNOSIS — K219 Gastro-esophageal reflux disease without esophagitis: Secondary | ICD-10-CM | POA: Insufficient documentation

## 2017-03-23 DIAGNOSIS — M129 Arthropathy, unspecified: Secondary | ICD-10-CM | POA: Insufficient documentation

## 2017-03-23 DIAGNOSIS — Z5111 Encounter for antineoplastic chemotherapy: Secondary | ICD-10-CM | POA: Insufficient documentation

## 2017-03-23 DIAGNOSIS — E1165 Type 2 diabetes mellitus with hyperglycemia: Secondary | ICD-10-CM

## 2017-03-23 DIAGNOSIS — D696 Thrombocytopenia, unspecified: Secondary | ICD-10-CM | POA: Diagnosis not present

## 2017-03-23 DIAGNOSIS — M5136 Other intervertebral disc degeneration, lumbar region: Secondary | ICD-10-CM

## 2017-03-23 DIAGNOSIS — R1011 Right upper quadrant pain: Secondary | ICD-10-CM | POA: Insufficient documentation

## 2017-03-23 DIAGNOSIS — E119 Type 2 diabetes mellitus without complications: Secondary | ICD-10-CM

## 2017-03-23 DIAGNOSIS — C221 Intrahepatic bile duct carcinoma: Secondary | ICD-10-CM | POA: Diagnosis not present

## 2017-03-23 DIAGNOSIS — Z9221 Personal history of antineoplastic chemotherapy: Secondary | ICD-10-CM

## 2017-03-23 DIAGNOSIS — J8410 Pulmonary fibrosis, unspecified: Secondary | ICD-10-CM | POA: Insufficient documentation

## 2017-03-23 LAB — CBC WITH DIFFERENTIAL/PLATELET
Basophils Absolute: 0 10*3/uL (ref 0–0.1)
Basophils Relative: 0 %
EOS ABS: 0.2 10*3/uL (ref 0–0.7)
EOS PCT: 2 %
HCT: 28.8 % — ABNORMAL LOW (ref 35.0–47.0)
Hemoglobin: 9.4 g/dL — ABNORMAL LOW (ref 12.0–16.0)
LYMPHS ABS: 0.6 10*3/uL — AB (ref 1.0–3.6)
Lymphocytes Relative: 7 %
MCH: 32.4 pg (ref 26.0–34.0)
MCHC: 32.6 g/dL (ref 32.0–36.0)
MCV: 99.4 fL (ref 80.0–100.0)
MONOS PCT: 17 %
Monocytes Absolute: 1.7 10*3/uL — ABNORMAL HIGH (ref 0.2–0.9)
Neutro Abs: 7.3 10*3/uL — ABNORMAL HIGH (ref 1.4–6.5)
Neutrophils Relative %: 74 %
PLATELETS: 197 10*3/uL (ref 150–440)
RBC: 2.89 MIL/uL — ABNORMAL LOW (ref 3.80–5.20)
RDW: 16.8 % — AB (ref 11.5–14.5)
WBC: 9.9 10*3/uL (ref 3.6–11.0)

## 2017-03-23 LAB — COMPREHENSIVE METABOLIC PANEL
ALT: 55 U/L — AB (ref 14–54)
AST: 52 U/L — AB (ref 15–41)
Albumin: 3 g/dL — ABNORMAL LOW (ref 3.5–5.0)
Alkaline Phosphatase: 305 U/L — ABNORMAL HIGH (ref 38–126)
Anion gap: 9 (ref 5–15)
BUN: 13 mg/dL (ref 6–20)
CHLORIDE: 92 mmol/L — AB (ref 101–111)
CO2: 32 mmol/L (ref 22–32)
CREATININE: 0.61 mg/dL (ref 0.44–1.00)
Calcium: 8.7 mg/dL — ABNORMAL LOW (ref 8.9–10.3)
GFR calc Af Amer: >60 mL/min (ref >60)
Glucose, Bld: 169 mg/dL — ABNORMAL HIGH (ref 65–99)
Potassium: 3.4 mmol/L — ABNORMAL LOW (ref 3.5–5.1)
Sodium: 133 mmol/L — ABNORMAL LOW (ref 135–145)
Total Bilirubin: 0.9 mg/dL (ref 0.3–1.2)
Total Protein: 6.6 g/dL (ref 6.5–8.1)

## 2017-03-23 MED ORDER — HEPARIN SOD (PORK) LOCK FLUSH 100 UNIT/ML IV SOLN
500.0000 [IU] | Freq: Once | INTRAVENOUS | Status: AC
  Administered 2017-03-23: 500 [IU] via INTRAVENOUS

## 2017-03-23 MED ORDER — DEXAMETHASONE 1 MG PO TABS: 2.0000 mg | ORAL_TABLET | Freq: Every day | ORAL | 0 refills | Status: DC

## 2017-03-23 MED ORDER — HEPARIN SOD (PORK) LOCK FLUSH 100 UNIT/ML IV SOLN
INTRAVENOUS | Status: AC
  Filled 2017-03-23: qty 5

## 2017-03-23 MED ORDER — VALACYCLOVIR HCL 1 G PO TABS: 1000.0000 mg | ORAL_TABLET | Freq: Two times a day (BID) | ORAL | 0 refills | Status: DC

## 2017-03-23 NOTE — Progress Notes (Signed)
Patient here for follow up with labs and treatment today. She states that she feels "terrible", she is weak, short of breath, and very fatigued. She is not wearing her oxygen because it is too hard to carry. Her O2 sat at rest is 90%.

## 2017-03-24 DIAGNOSIS — J449 Chronic obstructive pulmonary disease, unspecified: Secondary | ICD-10-CM | POA: Diagnosis not present

## 2017-03-24 LAB — CANCER ANTIGEN 19-9: CA 19-9: 72 U/mL — ABNORMAL HIGH (ref 0–35)

## 2017-03-26 DIAGNOSIS — J449 Chronic obstructive pulmonary disease, unspecified: Secondary | ICD-10-CM | POA: Diagnosis not present

## 2017-03-31 ENCOUNTER — Encounter: Payer: Self-pay | Admitting: Emergency Medicine

## 2017-03-31 ENCOUNTER — Other Ambulatory Visit: Payer: Self-pay

## 2017-03-31 ENCOUNTER — Inpatient Hospital Stay: Payer: PPO

## 2017-03-31 ENCOUNTER — Inpatient Hospital Stay (HOSPITAL_BASED_OUTPATIENT_CLINIC_OR_DEPARTMENT_OTHER): Payer: PPO | Admitting: Oncology

## 2017-03-31 ENCOUNTER — Emergency Department
Admission: EM | Admit: 2017-03-31 | Discharge: 2017-03-31 | Disposition: A | Discharge status: Home / Self Care | Payer: PPO | Attending: Emergency Medicine | Admitting: Emergency Medicine

## 2017-03-31 ENCOUNTER — Encounter: Payer: Self-pay | Admitting: Oncology

## 2017-03-31 VITALS — BP 152/81 | HR 106 | Temp 97.0°F | Wt 150.8 lb

## 2017-03-31 DIAGNOSIS — Y733 Surgical instruments, materials and gastroenterology and urology devices (including sutures) associated with adverse incidents: Secondary | ICD-10-CM | POA: Insufficient documentation

## 2017-03-31 DIAGNOSIS — T85698A Other mechanical complication of other specified internal prosthetic devices, implants and grafts, initial encounter: Secondary | ICD-10-CM | POA: Diagnosis not present

## 2017-03-31 DIAGNOSIS — J8410 Pulmonary fibrosis, unspecified: Secondary | ICD-10-CM | POA: Diagnosis not present

## 2017-03-31 DIAGNOSIS — C221 Intrahepatic bile duct carcinoma: Secondary | ICD-10-CM | POA: Diagnosis not present

## 2017-03-31 DIAGNOSIS — R1011 Right upper quadrant pain: Secondary | ICD-10-CM | POA: Diagnosis not present

## 2017-03-31 DIAGNOSIS — Z87891 Personal history of nicotine dependence: Secondary | ICD-10-CM | POA: Diagnosis not present

## 2017-03-31 DIAGNOSIS — D6481 Anemia due to antineoplastic chemotherapy: Secondary | ICD-10-CM

## 2017-03-31 DIAGNOSIS — Z859 Personal history of malignant neoplasm, unspecified: Secondary | ICD-10-CM | POA: Diagnosis not present

## 2017-03-31 DIAGNOSIS — E1165 Type 2 diabetes mellitus with hyperglycemia: Secondary | ICD-10-CM | POA: Diagnosis not present

## 2017-03-31 DIAGNOSIS — K5900 Constipation, unspecified: Secondary | ICD-10-CM | POA: Diagnosis not present

## 2017-03-31 DIAGNOSIS — K1379 Other lesions of oral mucosa: Secondary | ICD-10-CM | POA: Diagnosis not present

## 2017-03-31 DIAGNOSIS — T451X5A Adverse effect of antineoplastic and immunosuppressive drugs, initial encounter: Secondary | ICD-10-CM

## 2017-03-31 DIAGNOSIS — M329 Systemic lupus erythematosus, unspecified: Secondary | ICD-10-CM

## 2017-03-31 DIAGNOSIS — R809 Proteinuria, unspecified: Secondary | ICD-10-CM | POA: Insufficient documentation

## 2017-03-31 DIAGNOSIS — M5136 Other intervertebral disc degeneration, lumbar region: Secondary | ICD-10-CM

## 2017-03-31 DIAGNOSIS — E042 Nontoxic multinodular goiter: Secondary | ICD-10-CM | POA: Diagnosis not present

## 2017-03-31 DIAGNOSIS — E119 Type 2 diabetes mellitus without complications: Secondary | ICD-10-CM | POA: Diagnosis not present

## 2017-03-31 DIAGNOSIS — R531 Weakness: Secondary | ICD-10-CM

## 2017-03-31 DIAGNOSIS — Z9221 Personal history of antineoplastic chemotherapy: Secondary | ICD-10-CM

## 2017-03-31 DIAGNOSIS — R21 Rash and other nonspecific skin eruption: Secondary | ICD-10-CM

## 2017-03-31 DIAGNOSIS — M129 Arthropathy, unspecified: Secondary | ICD-10-CM

## 2017-03-31 DIAGNOSIS — Z79899 Other long term (current) drug therapy: Secondary | ICD-10-CM

## 2017-03-31 DIAGNOSIS — I129 Hypertensive chronic kidney disease with stage 1 through stage 4 chronic kidney disease, or unspecified chronic kidney disease: Secondary | ICD-10-CM

## 2017-03-31 DIAGNOSIS — I998 Other disorder of circulatory system: Secondary | ICD-10-CM

## 2017-03-31 DIAGNOSIS — J449 Chronic obstructive pulmonary disease, unspecified: Secondary | ICD-10-CM | POA: Diagnosis not present

## 2017-03-31 DIAGNOSIS — K5190 Ulcerative colitis, unspecified, without complications: Secondary | ICD-10-CM

## 2017-03-31 DIAGNOSIS — I10 Essential (primary) hypertension: Secondary | ICD-10-CM | POA: Diagnosis not present

## 2017-03-31 DIAGNOSIS — R530 Neoplastic (malignant) related fatigue: Secondary | ICD-10-CM | POA: Diagnosis not present

## 2017-03-31 DIAGNOSIS — F329 Major depressive disorder, single episode, unspecified: Secondary | ICD-10-CM

## 2017-03-31 DIAGNOSIS — K219 Gastro-esophageal reflux disease without esophagitis: Secondary | ICD-10-CM

## 2017-03-31 DIAGNOSIS — R0602 Shortness of breath: Secondary | ICD-10-CM | POA: Diagnosis not present

## 2017-03-31 DIAGNOSIS — Z5111 Encounter for antineoplastic chemotherapy: Secondary | ICD-10-CM | POA: Diagnosis not present

## 2017-03-31 DIAGNOSIS — Z7984 Long term (current) use of oral hypoglycemic drugs: Secondary | ICD-10-CM

## 2017-03-31 LAB — CBC WITH DIFFERENTIAL/PLATELET
Basophils Absolute: 0 10*3/uL (ref 0–0.1)
Basophils Relative: 0 %
EOS PCT: 1 %
Eosinophils Absolute: 0.1 10*3/uL (ref 0–0.7)
HCT: 30.6 % — ABNORMAL LOW (ref 35.0–47.0)
Hemoglobin: 9.9 g/dL — ABNORMAL LOW (ref 12.0–16.0)
LYMPHS ABS: 0.8 10*3/uL — AB (ref 1.0–3.6)
LYMPHS PCT: 8 %
MCH: 31.7 pg (ref 26.0–34.0)
MCHC: 32.4 g/dL (ref 32.0–36.0)
MCV: 97.8 fL (ref 80.0–100.0)
Monocytes Absolute: 1.4 10*3/uL — ABNORMAL HIGH (ref 0.2–0.9)
Monocytes Relative: 13 %
Neutro Abs: 8.4 10*3/uL — ABNORMAL HIGH (ref 1.4–6.5)
Neutrophils Relative %: 78 %
PLATELETS: 190 10*3/uL (ref 150–440)
RBC: 3.13 MIL/uL — AB (ref 3.80–5.20)
RDW: 15.9 % — ABNORMAL HIGH (ref 11.5–14.5)
WBC: 10.7 10*3/uL (ref 3.6–11.0)

## 2017-03-31 LAB — COMPREHENSIVE METABOLIC PANEL
ALBUMIN: 2.9 g/dL — AB (ref 3.5–5.0)
ALT: 54 U/L (ref 14–54)
ALT: 55 U/L — AB (ref 14–54)
AST: 67 U/L — AB (ref 15–41)
AST: 66 U/L — AB (ref 15–41)
Albumin: 3 g/dL — ABNORMAL LOW (ref 3.5–5.0)
Alkaline Phosphatase: 371 U/L — ABNORMAL HIGH (ref 38–126)
Alkaline Phosphatase: 360 U/L — ABNORMAL HIGH (ref 38–126)
Anion gap: 9 (ref 5–15)
Anion gap: 11 (ref 5–15)
BILIRUBIN TOTAL: 0.4 mg/dL (ref 0.3–1.2)
BUN: 16 mg/dL (ref 6–20)
BUN: 16 mg/dL (ref 6–20)
CALCIUM: 9.4 mg/dL (ref 8.9–10.3)
CHLORIDE: 98 mmol/L — AB (ref 101–111)
CO2: 27 mmol/L (ref 22–32)
CO2: 27 mmol/L (ref 22–32)
CREATININE: 0.73 mg/dL (ref 0.44–1.00)
Calcium: 9.2 mg/dL (ref 8.9–10.3)
Chloride: 98 mmol/L — ABNORMAL LOW (ref 101–111)
Creatinine, Ser: 0.77 mg/dL (ref 0.44–1.00)
GFR calc Af Amer: >60 mL/min (ref >60)
GFR calc Af Amer: >60 mL/min (ref >60)
GLUCOSE: 157 mg/dL — AB (ref 65–99)
Glucose, Bld: 152 mg/dL — ABNORMAL HIGH (ref 65–99)
POTASSIUM: 3.9 mmol/L (ref 3.5–5.1)
POTASSIUM: 4 mmol/L (ref 3.5–5.1)
SODIUM: 134 mmol/L — AB (ref 135–145)
Sodium: 136 mmol/L (ref 135–145)
TOTAL PROTEIN: 6.8 g/dL (ref 6.5–8.1)
Total Bilirubin: 0.7 mg/dL (ref 0.3–1.2)
Total Protein: 7 g/dL (ref 6.5–8.1)

## 2017-03-31 LAB — LIPASE, BLOOD: Lipase: 16 U/L (ref 11–51)

## 2017-03-31 LAB — VITAMIN B12: VITAMIN B 12: 671 pg/mL (ref 180–914)

## 2017-03-31 LAB — CBC
HCT: 30.1 % — ABNORMAL LOW (ref 35.0–47.0)
HEMOGLOBIN: 9.9 g/dL — AB (ref 12.0–16.0)
MCH: 32.1 pg (ref 26.0–34.0)
MCHC: 32.9 g/dL (ref 32.0–36.0)
MCV: 97.4 fL (ref 80.0–100.0)
Platelets: 171 10*3/uL (ref 150–440)
RBC: 3.09 MIL/uL — AB (ref 3.80–5.20)
RDW: 16.2 % — ABNORMAL HIGH (ref 11.5–14.5)
WBC: 9.9 10*3/uL (ref 3.6–11.0)

## 2017-03-31 NOTE — ED Provider Notes (Signed)
[facility] Emergency Department Provider Note  ____________________________________________   First MD Initiated Contact with Patient 03/31/17 1814     (approximate)  I have reviewed the triage vital signs and the nursing notes.   HISTORY  Chief Complaint Abdominal Pain   HPI Regina Hood is a 76 y.o. female a history of cholangiocarcinoma with biliary drains x2 who is presenting to the emergency department today with upper abdominal pain.  She says that she was seen by her oncologist earlier today who sent her to the emergency department for her right upper quadrant abdominal pain.  Patient says that she is having worsening pain over the past 2 weeks.  Says the pain is moderate and cramping.  It is not associated with any nausea vomiting or diarrhea.  She says that she usually pushes saline to flush the drains and while the midline drain is flushing fine the right upper quadrant drain is meeting her flushes with severe resistance.  Her oncologist was concerned about her slightly up trending alkaline phosphatase and sent her to the emergency department for possible imaging.  Past Medical History:  Diagnosis Date  . Arthritis   . Cancer ([place])   . Cellulitis   . Collagen vascular disease ([place])   . COPD (chronic obstructive pulmonary disease) ([place])   . DDD (degenerative disc disease), lumbar   . Depression   . Diabetes mellitus   . Diverticulitis   . GERD (gastroesophageal reflux disease)   . GI bleed   . Headache(784.0)   . Hypertension   . Lupus   . Ulcerative colitis [facility])     Patient Active Problem List   Diagnosis Date Noted  . Proteinuria 03/31/2017  . COPD exacerbation ([place]) 03/16/2017  . Dehydration 01/19/2017  . History of iron deficiency anemia 12/29/2016  . Systemic lupus erythematosus ([place]) 12/29/2016  . Cholangiocarcinoma ([place]) 12/29/2016  . History of ulcerative colitis 12/29/2016  . Goals of care, counseling/discussion 12/29/2016    . Sepsis ([place]) 11/13/2016  . At risk for sepsis 09/29/2016  . Red blood cell antibody positive, compatible PRBC difficult to obtain 09/22/2016  . Type 2 diabetes mellitus without complication, without long-term current use of insulin ([place]) 09/17/2016  . Liver mass, left lobe 08/23/2016  . Elevated liver enzymes 08/19/2016  . Pruritic rash 08/19/2016  . IDA (iron deficiency anemia) 07/18/2016  . Cellulitis of right upper extremity 07/05/2016  . History of rectal bleeding 07/05/2016  . Rectal bleeding 06/29/2016  . Hypertension 01/07/2016  . Anxiety and depression 09/26/2013  . DDD (degenerative disc disease) 09/26/2013  . Diabetes ([place]) 09/26/2013  . Sarcoidosis 09/26/2013  . UC (ulcerative colitis) ([place]) 09/26/2013  . Proximal humerus fracture, left, closed, initial encounter 09/17/2011    Past Surgical History:  Procedure Laterality Date  . ABDOMINAL HYSTERECTOMY    . BREAST BIOPSY Right   . COLONOSCOPY    . COLONOSCOPY WITH PROPOFOL N/A 08/18/2016   Procedure: COLONOSCOPY WITH PROPOFOL;  Surgeon: Manya Silvas, MD;  Location: [facility] ENDOSCOPY;  Service: Endoscopy;  Laterality: N/A;  . ESOPHAGOGASTRODUODENOSCOPY (EGD) WITH PROPOFOL N/A 08/18/2016   Procedure: ESOPHAGOGASTRODUODENOSCOPY (EGD) WITH PROPOFOL;  Surgeon: Manya Silvas, MD;  Location: [facility] ENDOSCOPY;  Service: Endoscopy;  Laterality: N/A;  . ORIF HUMERUS FRACTURE  09/17/2011   Procedure: OPEN REDUCTION INTERNAL FIXATION (ORIF) PROXIMAL HUMERUS FRACTURE;  Surgeon: Augustin Schooling, MD;  Location: [place];  Service: Orthopedics;  Laterality: Left;    Prior to Admission medications   Medication Sig  Start Date End Date Taking? Authorizing Provider  acetaminophen (TYLENOL) 500 MG tablet Take 1,000 mg by mouth 3 (three) times daily.    [provider]  amLODipine (NORVASC) 5 MG tablet Take 5 mg by mouth daily. 06/01/16   [provider]  calcium carbonate 100 mg/ml SUSP Take by mouth.    [provider]  Cyanocobalamin (VITAMIN B-12 PO) Take 1,000 mcg by mouth daily.    [provider]  dexamethasone (DECADRON) 1 MG tablet Take 2 tablets (2 mg total) by mouth daily with breakfast. 03/23/17   Earlie Server, MD  Elastic Bandages & Supports ([place]) [place] 2 each by Does not apply route daily. 03/09/17   Earlie Server, MD  estradiol (ESTRACE) 1 MG tablet Take 1 mg by mouth daily. 06/16/16   [provider]  Fluticasone-Salmeterol (ADVAIR DISKUS) 250-50 MCG/DOSE AEPB Inhale into the lungs. 06/01/16 06/01/17  [provider]  gabapentin (NEURONTIN) 100 MG capsule Take 100 mg by mouth 3 (three) times daily.     [provider]  hydroxychloroquine (PLAQUENIL) 200 MG tablet Take 200 mg by mouth 2 (two) times daily.     [provider]  lidocaine-prilocaine (EMLA) cream Apply 1 application topically as needed. 02/16/17   Cammie Sickle, MD  LORazepam (ATIVAN) 0.5 MG tablet Take 0.5 mg every 6 (six) hours as needed by mouth. 01/05/17   [provider]  metFORMIN (GLUCOPHAGE) 500 MG tablet Take 500 mg by mouth 2 (two) times daily with a meal.    [provider]  ondansetron (ZOFRAN) 8 MG tablet Take 1 tablet (8 mg total) by mouth every 12 (twelve) hours as needed. 02/16/17   Cammie Sickle, MD  oxyCODONE (OXY IR/ROXICODONE) 5 MG immediate release tablet Take 1 tablet (5 mg total) by mouth every 6 (six) hours as needed. 01/12/17   Earlie Server, MD  pantoprazole (PROTONIX) 40 MG tablet Take 40 mg by mouth daily.    [provider]  polyethylene glycol powder (GLYCOLAX/MIRALAX) powder  09/25/16   [provider]  potassium chloride SA (K-DUR,KLOR-CON) 20 MEQ tablet Take 1 tablet (20 mEq total) by mouth daily as needed (low potassium). 03/09/17   Earlie Server, MD  prednisoLONE (PRELONE) 15 MG/5ML SOLN  03/23/17   [provider]  prochlorperazine (COMPAZINE) 10 MG tablet Take 1 tablet (10 mg total) by mouth every  6 (six) hours as needed. 02/16/17   Cammie Sickle, MD  senna (SENOKOT) 8.6 MG TABS tablet Take 2 tablets (17.2 mg total) by mouth daily. Hold for loose stools. 02/16/17   Cammie Sickle, MD  sodium chloride 0.9 % injection Flush each biliary drains (2) with 10 mL two times daily. 03/18/17   [provider]  Sodium Chloride Flush (NORMAL SALINE FLUSH) 0.9 % SOLN 1 mL by Other route every 8 (eight) hours. Flush each biliary drain with 1 mL twice daily 02/16/17   Cammie Sickle, MD  triamcinolone ointment (KENALOG) 0.1 % Apply 1 application topically 2 (two) times daily. 03/09/17   Earlie Server, MD  venlafaxine XR (EFFEXOR-XR) 75 MG 24 hr capsule Take 225 mg by mouth daily.     [provider]  zolpidem (AMBIEN) 10 MG tablet Take 1 tablet (10 mg total) by mouth at bedtime as needed. 02/16/17 03/31/17  Cammie Sickle, MD    Allergies Feldene [piroxicam]; Penicillamine; and Qualaquin [quinine]  Family History  Problem Relation Age of Onset  . Breast cancer Mother 64  .  COPD Father   . Prostate cancer Brother     Social History Social History   Tobacco Use  . Smoking status: Former Smoker    Packs/day: 1.00    Years: 10.00    Pack years: 10.00    Types: Cigarettes    Last attempt to quit: 12/30/1998    Years since quitting: 18.2  . Smokeless tobacco: Never Used  . Tobacco comment: quit smoking in 2003  Substance Use Topics  . Alcohol use: No  . Drug use: No    Review of Systems  Constitutional: No fever/chills Eyes: No visual changes. ENT: No sore throat. Cardiovascular: Denies chest pain. Respiratory: Denies shortness of breath. Gastrointestinal:   No nausea, no vomiting.  No diarrhea.  No constipation. Genitourinary: Negative for dysuria. Musculoskeletal: Negative for back pain. Skin: Negative for rash. Neurological: Negative for headaches, focal weakness or numbness.   ____________________________________________   PHYSICAL  EXAM:  VITAL SIGNS: ED Triage Vitals  Enc Vitals Group     BP 03/31/17 1424 (!) 150/77     Pulse Rate 03/31/17 1424 98     Resp 03/31/17 1424 18     Temp 03/31/17 1424 98.5 F (36.9 C)     Temp Source 03/31/17 1424 Oral     SpO2 03/31/17 1424 96 %     Weight 03/31/17 1425 150 lb (68 kg)     Height 03/31/17 1425 5' 2"  (1.575 m)     Head Circumference --      Peak Flow --      Pain Score 03/31/17 1424 6     Pain Loc --      Pain Edu? --      Excl. in [place]? --     Constitutional: Alert and oriented. Well appearing and in no acute distress. Eyes: Conjunctivae are normal.  Head: Atraumatic. Nose: No congestion/rhinnorhea. Mouth/Throat: Mucous membranes are moist.  Neck: No stridor.   Cardiovascular: Normal rate, regular rhythm. Grossly normal heart sounds.  Good peripheral circulation. Respiratory: Normal respiratory effort.  No retractions. Lungs CTAB. Gastrointestinal: Soft with mild right upper quadrant tenderness to palpation without rebound or guarding.  The drains are in place to the right upper quadrant as well as the epigastrium.  Neither drain has any surrounding leakage, erythema, pus or induration. No distention.  Musculoskeletal: No lower extremity tenderness nor edema.  No joint effusions. Neurologic:  Normal speech and language. No gross focal neurologic deficits are appreciated. Skin:  Skin is warm, dry and intact. No rash noted. Psychiatric: Mood and affect are normal. Speech and behavior are normal.  ____________________________________________   LABS (all labs ordered are listed, but only abnormal results are displayed)  Labs Reviewed  COMPREHENSIVE METABOLIC PANEL - Abnormal; Notable for the following components:      Result Value   Chloride 98 (*)    Glucose, Bld 152 (*)    Albumin 3.0 (*)    AST 66 (*)    ALT 55 (*)    Alkaline Phosphatase 360 (*)    All other components within normal limits  CBC - Abnormal; Notable for the following components:   RBC  3.09 (*)    Hemoglobin 9.9 (*)    HCT 30.1 (*)    RDW 16.2 (*)    All other components within normal limits  LIPASE, BLOOD  URINALYSIS, COMPLETE (UACMP) WITH MICROSCOPIC   ____________________________________________  EKG   ____________________________________________  RADIOLOGY   ____________________________________________   PROCEDURES  Procedure(s) performed:   Procedures  Critical Care performed:   ____________________________________________   INITIAL IMPRESSION / ASSESSMENT AND PLAN / ED COURSE  Pertinent labs & imaging results that were available during my care of the patient were reviewed by me and considered in my medical decision making (see chart for details).  Differential diagnosis includes, but is not limited to, biliary disease (biliary colic, acute cholecystitis, cholangitis, choledocholithiasis, etc), intrathoracic causes for epigastric abdominal pain including ACS, gastritis, duodenitis, pancreatitis, small bowel or large bowel obstruction, abdominal aortic aneurysm, hernia, and gastritis, clogged biliary drain As part of my medical decision making, I reviewed the following data within the [place] chart reviewed  ----------------------------------------- 6:56 PM on 03/31/2017 -----------------------------------------  I consulted Dr. Burt Knack of the surgery service to ask what imaging he thought would be most appropriate.  He says not to do any imaging right now but to check with the patient's surgeon at [facility] for further guidance.  ----------------------------------------- 7:54 PM on 03/31/2017 -----------------------------------------  I discussed the case with Dr. Harl Bowie of surgery at [facility] who recommends that the patient stay n.p.o. overnight and says that he will be contacting interventional radiology department to set up the patient for a tube exchange for likely clogged drainage tube.  Patient is not  cholangitic.  No fever, altered mental status.  Labs appear to be at baseline.  She is understanding of this plan and willing to plan including staying n.p.o. overnight and taking only her pills with sips of water in the morning.  Patient will be discharged at this time.     ____________________________________________   FINAL CLINICAL IMPRESSION(S) / ED DIAGNOSES  Abdominal pain.  Biliary drain malfunction    NEW MEDICATIONS STARTED DURING THIS VISIT:  This SmartLink is deprecated. Use AVSMEDLIST instead to display the medication list for a patient.   Note:  This document was prepared using Dragon voice recognition software and may include unintentional dictation errors.     Orbie Pyo, MD 03/31/17 Karl Bales

## 2017-03-31 NOTE — ED Notes (Signed)
Up to bathroom  Explained wait .

## 2017-03-31 NOTE — ED Triage Notes (Signed)
Pt comes into the ED via PV c/o abd pain where her bile duct drain is.  Patient states that she believes it is clogged.  Explains that she has tried to flush it multiple times and is meeting a lot of resistance with the flush.  Denies any N/V, chest pain, or dizziness.  Patient  Explains that her last decent flush of the drain was last week.  Patient has even and unlabored respirations and is in NAD at this time.

## 2017-03-31 NOTE — ED Notes (Signed)
D/c inst to pt.

## 2017-03-31 NOTE — ED Notes (Signed)
Pt reports she has liver cancer.   Pt states she has abd pain and pt has 2 drains in the abdomen.  Pt is on chemo at [place].  Pt denies n/v/d.    Pt is alert.

## 2017-03-31 NOTE — Progress Notes (Signed)
Hematology/Oncology Follow up note [facility] [phone] [phone]  Patient Care Team: Idelle Crouch, MD as PCP - General (Internal Medicine) REASON FOR VISIT Follow up for treatment of cholangiocarcinoma  PERTINENT ONCOLOGY HISTORY Regina Hood 76 y.o.  female with PMH listed as below, most significantly for cholangiocarcinoma who has been on chemotherapy treatment presents to transfer her cancer care to our cancer center.  After extensive medical record review, summary of her diagnosis and treatment history are listed below.  1. Initially presented in 07/2016 to an outside ED with lower and upper GI bleed.  A. She was found to have new LFTs elevations including AST 340, ALT 379, Alk phos 420 and t bili 0.9.   B. [place] was obtained, which did not visualize the left lobe.  C. She was evaluated by gastroenterology because of her history of UC and underwent colonoscopy, which was unremarkable per path report.  2. 08/20/16, CT noted diffuse atrophy of the left lobe with left intrahepatic biliary ductal dilatation, with 4.1 cm ill-defined low-attenuation mass. This mass was suspicious for malignancy.  3. 08/2016, MRI/MRCP which showed abnormal left hepatic lobe with diffuse biliary dilatation with a shrunken left hepatic lobe and a 2.42.9 cm oval-shaped masslike component displacing the adjacent bile ducts. There is an approximately 1.6 cm segment truncated bile ducts involving common hepatic duct and right and left hepatic although no dilatation of the right hepatic duct system was seen.   4. 09/02/16, ERCP showed localized biliary stricture in the left intrahepatic duct with dilation of the left intrahepatic branches. Sphincterotomy was performed and a stent was placed. EUS with a 3.3 cm mass and left duct dilation.  5. Repeat ERCP was done 09/10/16, during which the stent was exchanged.  6. 09/20/16, taken to OR and noted to a have an peritoneal  implant with biopsy c/w adenocarcinoma. The neoplastic cells are positive for CK7. They are negative for CK20, CDX-2, TTF-1, Napsin A, hep-par1, glypican 3, and arginase. The immunohistochemical features are consistent with adenocarcinoma of upper gastrointestinal tract or pancreaticobiliary origin.  7. 09/28/16, initially seen in medical oncology clinic by Dr. Filomena Jungling 8. 09/29/16, admitted for biliary obstruction and cholangitis, s/p PBD placement, d/c 10/06/16 9. 10/12/16, initiated on gemcitabine (400 mg/m2). CA 19-9 167 10. 10/26/16, increased gemcitabine (800 mg/m2) 11. 11/23/2016 Cycle 3 Gemzar (865m/m2), 12/07/2016, Gemzar (8050mm2) added cisplatin 2547m2  12. 12/14/2016, CT CAP shows slight interval decrease in size of L hepatic lobe cholangio. Slight interval increase in size of porta hepatis lymph node whiel other nodes are slightly smaller. Multiple bilateral thyroid nodules. Stable mediastinal lymph nodes. CA 19-9 60 14  8/30 Cycle 4 Gemzar (800m62m) added cisplatin 20mg66m 15, 12/28/2016 Gemcitabine 800mg/63mcisplatin was hold due to anemia. S/p 1 PRBC transfusion.   [facility]   [facility] to [facility] [place]    She forgot her appointment on day 8 and Gemcitabine 800mg/m81md cisplatin 20mg/m213m given  Day 9 of cycle 4 (01/05/2017).        01/19/2017  Cycle 5 Day 1Gemcitabine 800mg/m2,30mplatin 20mg/m2, 25munit blood transfusion.        01/26/2017 Cycle 5 Day 8 Gemcitabine 800mg/m2, c41matin 20mg/m2   125me has been on Dexamethasone 4mg daily fo16mkin rash since this [facility]264m018. Recently decreased to 1 mg daily due to hyperglycemia.  INTERVAL HISTORY  Regina Hood 76 y.o.  female with above history reviewed by me today who presents for evaluation prior to Gemcitabine chemotherapy. .  She reports right upper quadrant pain, constant, which has been ongoing for the past week. She was seen by me last week and did  not mention. She told me today that at that time she was feeling just a little bit of pain starting and and did not pay attention.  She was having of pain for tongue ulcer at that time which actually improved after course of Valtrex. She has 2 PBD which she usually flushes with saline every day. Recently one of them cannot be flushed appropriately she feels resistance when she flushed. Denies any fever or chills.   Review of Systems  Constitutional: Positive for fatigue. Negative for appetite change, chills, fever and unexpected weight change.  HENT:   Negative for hearing loss, lump/mass and mouth sores.   Eyes: Negative for eye problems.  Respiratory: Negative for chest tightness and cough.   Cardiovascular: Negative for chest pain and leg swelling.  Gastrointestinal: Positive for abdominal pain. Negative for abdominal distention and blood in stool.  Endocrine: Negative for hot flashes.  Genitourinary: Negative for difficulty urinating and dysuria.   Musculoskeletal: Negative for arthralgias and gait problem.  Skin: Positive for rash. Negative for itching.       Skin inflammation at the suture site of percutaneous bile ducts draining.  Neurological: Negative for dizziness and gait problem.  Hematological: Negative for adenopathy. Does not bruise/bleed easily.  Psychiatric/Behavioral: Negative for confusion. The patient is not nervous/anxious.    MEDICAL HISTORY:  Past Medical History:  Diagnosis Date  . Arthritis   . Cancer ([place])   . Cellulitis   . Collagen vascular disease ([place])   . COPD (chronic obstructive pulmonary disease) ([place])   . DDD (degenerative disc disease), lumbar   . Depression   . Diabetes mellitus   . Diverticulitis   . GERD (gastroesophageal reflux disease)   . GI bleed   . Headache(784.0)   . Hypertension   . Lupus   . Ulcerative colitis ([place])     SURGICAL HISTORY: Past Surgical History:  Procedure Laterality Date  . ABDOMINAL HYSTERECTOMY    . BREAST  BIOPSY Right   . COLONOSCOPY    . COLONOSCOPY WITH PROPOFOL N/A 08/18/2016   Procedure: COLONOSCOPY WITH PROPOFOL;  Surgeon: Manya Silvas, MD;  Location: [facility]COPY;  Service: Endoscopy;  Laterality: N/A;  . ESOPHAGOGASTRODUODENOSCOPY (EGD) WITH PROPOFOL N/A 08/18/2016   Procedure: ESOPHAGOGASTRODUODENOSCOPY (EGD) WITH PROPOFOL;  Surgeon: Manya Silvas, MD;  Location: [facility] ENDOSCOPY;  Service: Endoscopy;  Laterality: N/A;  . ORIF HUMERUS FRACTURE  09/17/2011   Procedure: OPEN REDUCTION INTERNAL FIXATION (ORIF) PROXIMAL HUMERUS FRACTURE;  Surgeon: Augustin Schooling, MD;  Location: [facility];  Service: Orthopedics;  Laterality: Left;    SOCIAL HISTORY: Social History   Socioeconomic History  . Marital status: Widowed    Spouse name: Not on file  . Number of children: Not on file  . Years of education: Not on file  . Highest education level: Not on file  Social Needs  . Financial resource strain: Not on file  . Food insecurity - worry: Not on file  . Food insecurity - inability: Not on file  . Transportation needs - medical: Not on file  . Transportation needs - non-medical: Not on file  Occupational History  . Not on file  Tobacco Use  . Smoking status: Former Smoker  Packs/day: 1.00    Years: 10.00    Pack years: 10.00    Types: Cigarettes    Last attempt to quit: 12/30/1998    Years since quitting: 18.2  . Smokeless tobacco: Never Used  . Tobacco comment: quit smoking in 2003  Substance and Sexual Activity  . Alcohol use: No  . Drug use: No  . Sexual activity: Not on file  Other Topics Concern  . Not on file  Social History Narrative  . Not on file    FAMILY HISTORY: Family History  Problem Relation Age of Onset  . Breast cancer Mother 27  . COPD Father   . Prostate cancer Brother     ALLERGIES:  is allergic to feldene [piroxicam]; penicillamine; and qualaquin [quinine].  MEDICATIONS:  Current Outpatient Medications  Medication Sig Dispense Refill  .  acetaminophen (TYLENOL) 500 MG tablet Take 1,000 mg by mouth 3 (three) times daily.    Marland Kitchen amLODipine (NORVASC) 5 MG tablet Take 5 mg by mouth daily.    . calcium carbonate 100 mg/ml SUSP Take by mouth.    . Cyanocobalamin (VITAMIN B-12 PO) Take 1,000 mcg by mouth daily.    Marland Kitchen dexamethasone (DECADRON) 1 MG tablet Take 2 tablets (2 mg total) by mouth daily with breakfast. 60 tablet 0  . Elastic Bandages & Supports (MEDICAL COMPRESSION STOCKINGS) MISC 2 each by Does not apply route daily. 2 each 1  . estradiol (ESTRACE) 1 MG tablet Take 1 mg by mouth daily.    . Fluticasone-Salmeterol (ADVAIR DISKUS) 250-50 MCG/DOSE AEPB Inhale into the lungs.    . gabapentin (NEURONTIN) 100 MG capsule Take 100 mg by mouth 3 (three) times daily.     . hydroxychloroquine (PLAQUENIL) 200 MG tablet Take 200 mg by mouth 2 (two) times daily.     Marland Kitchen lidocaine-prilocaine (EMLA) cream Apply 1 application topically as needed. 30 g 3  . LORazepam (ATIVAN) 0.5 MG tablet Take 0.5 mg every 6 (six) hours as needed by mouth.  0  . metFORMIN (GLUCOPHAGE) 500 MG tablet Take 500 mg by mouth 2 (two) times daily with a meal.    . ondansetron (ZOFRAN) 8 MG tablet Take 1 tablet (8 mg total) by mouth every 12 (twelve) hours as needed. 30 tablet 3  . oxyCODONE (OXY IR/ROXICODONE) 5 MG immediate release tablet Take 1 tablet (5 mg total) by mouth every 6 (six) hours as needed. 120 tablet 0  . pantoprazole (PROTONIX) 40 MG tablet Take 40 mg by mouth daily.    . polyethylene glycol powder (GLYCOLAX/MIRALAX) powder     . potassium chloride SA (K-DUR,KLOR-CON) 20 MEQ tablet Take 1 tablet (20 mEq total) by mouth daily as needed (low potassium). 30 tablet 0  . prochlorperazine (COMPAZINE) 10 MG tablet Take 1 tablet (10 mg total) by mouth every 6 (six) hours as needed. 30 tablet 3  . senna (SENOKOT) 8.6 MG TABS tablet Take 2 tablets (17.2 mg total) by mouth daily. Hold for loose stools. 120 each 3  . Sodium Chloride Flush (NORMAL SALINE FLUSH) 0.9 %  SOLN 1 mL by Other route every 8 (eight) hours. Flush each biliary drain with 1 mL twice daily 84 Syringe 3  . triamcinolone ointment (KENALOG) 0.1 % Apply 1 application topically 2 (two) times daily. 80 g 0  . valACYclovir (VALTREX) 1000 MG tablet Take 1 tablet (1,000 mg total) by mouth 2 (two) times daily. 14 tablet 0  . venlafaxine XR (EFFEXOR-XR) 75 MG 24 hr capsule Take 225  mg by mouth daily.     Marland Kitchen zolpidem (AMBIEN) 10 MG tablet Take 1 tablet (10 mg total) by mouth at bedtime as needed. 30 tablet 3   No current facility-administered medications for this visit.       Marland Kitchen  PHYSICAL EXAMINATION: ECOG PERFORMANCE STATUS: 2 - Symptomatic, <50% confined to bed Vitals:   03/31/17 1348  BP: (!) 152/81  Pulse: (!) 106  Temp: (!) 97 F (36.1 C)   Filed Weights   03/31/17 1348  Weight: 150 lb 12.8 oz (68.4 kg)   Physical Exam  Constitutional: She is oriented to person, place, and time. No distress.  HENT:  Head: Normocephalic and atraumatic.  Mouth/Throat: No oropharyngeal exudate.  Right side Tongue sore.   Eyes: EOM are normal. Pupils are equal, round, and reactive to light. No scleral icterus.  Neck: Normal range of motion. Neck supple.  Cardiovascular: Normal rate.  No murmur heard. Tachycardic  Pulmonary/Chest: Effort normal and breath sounds normal. No respiratory distress. She has no wheezes.  Abdominal: Soft. Bowel sounds are normal. She exhibits no distension.  Right upper quadrant tenderness. PBD sites with greenish discharge  Musculoskeletal: Normal range of motion. She exhibits edema. She exhibits no tenderness.  1+ pitting edema.  Neurological: She is oriented to person, place, and time. No cranial nerve deficit.  Skin: Skin is dry. Rash noted. No pallor.  Psychiatric: Affect and judgment normal.   .    LABORATORY DATA:  I have reviewed the data as listed Lab Results  Component Value Date   WBC 10.7 03/31/2017   HGB 9.9 (L) 03/31/2017   HCT 30.6 (L)  03/31/2017   MCV 97.8 03/31/2017   PLT 190 03/31/2017   Recent Labs    03/09/17 1328 03/16/17 0913 03/17/17 0417 03/23/17 1007  NA 135 137 138 133*  K 3.3* 3.6 3.5 3.4*  CL 99* 99* 100* 92*  CO2 _0 32  GLUCOSE 202* 151* 174* 169*  BUN _1 CREATININE 0.52 0.57 0.57 0.61  CALCIUM 8.3* 8.4* 8.6* 8.7*  GFRNONAA >60 >60 >60 >60  GFRAA >60 >60 >60 >60  PROT 6.0* 6.4*  --  6.6  ALBUMIN 2.8* 3.0*  --  3.0*  AST 64* 60*  --  52*  ALT 43 33  --  55*  ALKPHOS 297* 292*  --  305*  BILITOT 0.7 0.8  --  0.9   [facility] CA 19-9   09/14/16 220 --> 09/28/16 176--> 10/12/16 167-->12/07/2016 64-->12/14/2016 60. --> 12/29/2016 87--> 01/26/2017 99  RADIOGRAPHIC STUDIES: I have personally reviewed the radiological images as listed and agreed with the findings in the report. CT 12/14/2016 [facility] Impression: 1. Slight interval decrease in size of the left hepatic lobe cholangiocarcinoma. Slight interval decrease in size of the hypoattenuating left hepatic lobe lesion which now measures 3.6 x 2.8 cm, previously 3.9 x 2.9 cm 2. Slight interval increase in size of a porta hepatis lymph node. Other nodes are slightly smaller. 3. Multiple bilateral thyroid nodules. Recommend ultrasound for further evaluation. 4. Stable mediastinal lymph nodes.  CT chest abdomen pelvis 02/24/2017 comparing to 5/918 Study in [facility].  1. No acute findings within the chest, abdomen or pelvis. 2. There has been interval decrease in size of mass associated with left lobe of liver. No specific findings identified to suggest metastatic disease.Mass within the anterior and central aspect of the left lobe appears decreased in size from previous exam measuring  3.0 x 3.2 cm, 3. Bilateral percutaneous biliary drainage catheters are in place without significant biliary ductal dilatation. 4. Bilateral pleural calcifications and chronic interstitial changes of pulmonary fibrosis. Prominent  mediastinal lymph nodes are nonspecific in the setting of interstitial lung disease.   ASSESSMENT & PLAN:  Cancer Staging Cholangiocarcinoma [facility]) Staging form: Intrahepatic Bile Duct, AJCC 8th Edition - Clinical stage from 12/29/2016: Stage IV (cT1, cNX, pM1) - Signed by Earlie Server, MD on 12/29/2016  1. Cholangiocarcinoma ([place])   2. Anemia due to antineoplastic chemotherapy   3. Encounter for antineoplastic chemotherapy   4. Right upper quadrant pain    #  hold today's gemcitabine treatment due to acute right upper quadrant pain. #Abdominal pain: Patient may have bile duct blockage, alkaline phosphatase trending up, we will send patient to emergency room for  imaging studies and evaluation for the need of further stenting or stent change if blockage was identified.   # SOB, PE and PNA were ruled out. sestamibi better today. On Dexamethasone to 48m daily.    # Macrocytic anemia likely gemcitabine side effects... BW03and folic level pending. Hb improved to 9.9,  she is status post 1 dose of procrit use.      # Constipation due to Opiod: controlled. continue Sennakot 2 tabs daily. Marilax PRN. She has daily BM.   # Skin rash/lupus, advise patient to follow up with Rhematology. On Plaquenil and Dexamethasone 251m       # hyperglycemia, continue monitor. On Metformin 50077mID, may need to adjust if glucose continue to increase. Currently stable.   # I have previously refer the patient to palliative care medicine for symptom control. Patient understands that there is no cure for her disease and treatment is with palliative intent.    All questions were answered. The patient knows to call the clinic with any problems questions or concerns.  Return of visit:  Reschedule after ED visit, depending on her situation. She will call us [place] re-schedule  ZhoEarlie ServerD, PhD Hematology Oncology [facility]- [phone]/13/2018

## 2017-04-01 DIAGNOSIS — C221 Intrahepatic bile duct carcinoma: Secondary | ICD-10-CM | POA: Diagnosis not present

## 2017-04-01 DIAGNOSIS — Z9221 Personal history of antineoplastic chemotherapy: Secondary | ICD-10-CM | POA: Diagnosis not present

## 2017-04-01 DIAGNOSIS — C249 Malignant neoplasm of biliary tract, unspecified: Secondary | ICD-10-CM | POA: Diagnosis not present

## 2017-04-01 DIAGNOSIS — R768 Other specified abnormal immunological findings in serum: Secondary | ICD-10-CM | POA: Diagnosis not present

## 2017-04-01 DIAGNOSIS — R1011 Right upper quadrant pain: Secondary | ICD-10-CM | POA: Diagnosis not present

## 2017-04-04 ENCOUNTER — Other Ambulatory Visit: Payer: Self-pay | Admitting: Oncology

## 2017-04-05 NOTE — Progress Notes (Signed)
Hematology/Oncology Follow up note Regina Hood [phone] [phone]  Patient Care Team: Idelle Crouch, MD as PCP - General (Internal Medicine) REASON FOR VISIT Follow up for treatment of cholangiocarcinoma  PERTINENT ONCOLOGY HISTORY Regina Hood 76 y.o.  female with PMH listed as below, most significantly for cholangiocarcinoma who has been on chemotherapy treatment presents to transfer her cancer care to our cancer [facility].  After extensive medical record review, summary of her diagnosis and treatment history are listed below.  1. Initially presented in 07/2016 to an outside ED with lower and upper GI bleed.  A. She was found to have new LFTs elevations including AST 340, ALT 379, Alk phos 420 and t bili 0.9.   B. [place] was obtained, which did not visualize the left lobe.  C. She was evaluated by gastroenterology because of her history of UC and underwent colonoscopy, which was unremarkable per path report.  2. 08/20/16, CT noted diffuse atrophy of the left lobe with left intrahepatic biliary ductal dilatation, with 4.1 cm ill-defined low-attenuation mass. This mass was suspicious for malignancy.  3. 08/2016, MRI/MRCP which showed abnormal left hepatic lobe with diffuse biliary dilatation with a shrunken left hepatic lobe and a 2.42.9 cm oval-shaped masslike component displacing the adjacent bile ducts. There is an approximately 1.6 cm segment truncated bile ducts involving common hepatic duct and right and left hepatic although no dilatation of the right hepatic duct system was seen.   4. 09/02/16, ERCP showed localized biliary stricture in the left intrahepatic duct with dilation of the left intrahepatic branches. Sphincterotomy was performed and a stent was placed. EUS with a 3.3 cm mass and left duct dilation.  5. Repeat ERCP was done 09/10/16, during which the stent was exchanged.  6. 09/20/16, taken to OR and noted to a have an peritoneal  implant with biopsy c/w adenocarcinoma. The neoplastic cells are positive for CK7. They are negative for CK20, CDX-2, TTF-1, Napsin A, hep-par1, glypican 3, and arginase. The immunohistochemical features are consistent with adenocarcinoma of upper gastrointestinal tract or pancreaticobiliary origin.  7. 09/28/16, initially seen in medical oncology clinic by Dr. Filomena Jungling 8. 09/29/16, admitted for biliary obstruction and cholangitis, s/p PBD placement, d/c 10/06/16 9. 10/12/16, initiated on gemcitabine (400 mg/m2). CA 19-9 167 10. 10/26/16, increased gemcitabine (800 mg/m2) 11. 11/23/2016 Cycle 3 Gemzar (865m/m2), 12/07/2016, Gemzar (8050mm2) added cisplatin 2547m2  12. 12/14/2016, CT CAP shows slight interval decrease in size of L hepatic lobe cholangio. Slight interval increase in size of porta hepatis lymph node whiel other nodes are slightly smaller. Multiple bilateral thyroid nodules. Stable mediastinal lymph nodes. CA 19-9 60 14  8/30 Cycle 4 Gemzar (800m62m) added cisplatin 20mg66m 15, 12/28/2016 Gemcitabine 800mg/63mcisplatin was hold due to anemia. S/p 1 PRBC transfusion.   [facility]   [facility] to [facility] [place]    She forgot her appointment on day 8 and Gemcitabine 800mg/m81md cisplatin 20mg/m213m given  Day 9 of cycle 4 (01/05/2017).        01/19/2017  Cycle 5 Day 1Gemcitabine 800mg/m2,30mplatin 20mg/m2, 25munit blood transfusion.        01/26/2017 Cycle 5 Day 8 Gemcitabine 800mg/m2, c41matin 20mg/m2   125me has been on Dexamethasone 4mg daily fo16mkin rash since this [facility] since 9/264m018. Recently decreased to 1 mg daily due to hyperglycemia.  INTERVAL HISTORY  Regina Hood 76 y.o.  female with above history reviewed by me today who presents for evaluation prior to Gemcitabine chemotherapy. . Patient was seen by me last week and at that time she complains severe right upper quadrant pain, and not able to flush her PBD  drain. She was sent to ER for further evaluation and image studies. From there she was advised to go home first and follow-up with [facility] oncology.. Patient was seen by Dr. Rigoberto Noel, and the PBD drain was cleaned and flushed. Wished patient was at [facility] oncologist's office. She also felt the pain has improved. She feels better today.   Review of Systems  Constitutional: Positive for fatigue. Negative for appetite change, chills, fever and unexpected weight change.  HENT:   Negative for hearing loss, lump/mass and mouth sores.   Eyes: Negative for eye problems.  Respiratory: Negative for chest tightness and cough.   Cardiovascular: Negative for chest pain and leg swelling.  Gastrointestinal: Negative for abdominal distention, abdominal pain and blood in stool.  Endocrine: Negative for hot flashes.  Genitourinary: Negative for difficulty urinating and dysuria.   Musculoskeletal: Negative for arthralgias and gait problem.  Skin: Positive for rash. Negative for itching.       Skin inflammation at the suture site of percutaneous bile ducts draining.  Neurological: Negative for dizziness and gait problem.  Hematological: Negative for adenopathy. Does not bruise/bleed easily.  Psychiatric/Behavioral: Negative for confusion. The patient is not nervous/anxious.    MEDICAL HISTORY:  Past Medical History:  Diagnosis Date  . Arthritis   . Cancer ([place])   . Cellulitis   . Collagen vascular disease ([place])   . COPD (chronic obstructive pulmonary disease) ([place])   . DDD (degenerative disc disease), lumbar   . Depression   . Diabetes mellitus   . Diverticulitis   . GERD (gastroesophageal reflux disease)   . GI bleed   . Headache(784.0)   . Hypertension   . Lupus   . Ulcerative colitis ([place])     SURGICAL HISTORY: Past Surgical History:  Procedure Laterality Date  . ABDOMINAL HYSTERECTOMY    . BREAST BIOPSY Right   . COLONOSCOPY    . COLONOSCOPY WITH PROPOFOL N/A 08/18/2016   Procedure: COLONOSCOPY WITH  PROPOFOL;  Surgeon: Manya Silvas, MD;  Location[facility] ENDOSCOPY;  Service: Endoscopy;  Laterality: N/A;  . ESOPHAGOGASTRODUODENOSCOPY (EGD) WITH PROPOFOL N/A 08/18/2016   Procedure: ESOPHAGOGASTRODUODENOSCOPY (EGD) WITH PROPOFOL;  Surgeon: Manya Silvas, MD;  Location: [facility] ENDOSCOPY;  Service: Endoscopy;  Laterality: N/A;  . ORIF HUMERUS FRACTURE  09/17/2011   Procedure: OPEN REDUCTION INTERNAL FIXATION (ORIF) PROXIMAL HUMERUS FRACTURE;  Surgeon: Augustin Schooling, MD;  Location: [facility];  Service: Orthopedics;  Laterality: Left;    SOCIAL HISTORY: Social History   Socioeconomic History  . Marital status: Widowed    Spouse name: Not on file  . Number of children: Not on file  . Years of education: Not on file  . Highest education level: Not on file  Social Needs  . Financial resource strain: Not on file  . Food insecurity - worry: Not on file  . Food insecurity - inability: Not on file  . Transportation needs - medical: Not on file  . Transportation needs - non-medical: Not on file  Occupational History  . Not on file  Tobacco Use  . Smoking status: Former Smoker    Packs/day: 1.00    Years: 10.00    Pack years: 10.00    Types: Cigarettes  Last attempt to quit: 12/30/1998    Years since quitting: 18.2  . Smokeless tobacco: Never Used  . Tobacco comment: quit smoking in 2003  Substance and Sexual Activity  . Alcohol use: No  . Drug use: No  . Sexual activity: Not on file  Other Topics Concern  . Not on file  Social History Narrative  . Not on file    FAMILY HISTORY: Family History  Problem Relation Age of Onset  . Breast cancer Mother 50  . COPD Father   . Prostate cancer Brother     ALLERGIES:  is allergic to feldene [piroxicam]; penicillamine; and qualaquin [quinine].  MEDICATIONS:  Current Outpatient Medications  Medication Sig Dispense Refill  . acetaminophen (TYLENOL) 500 MG tablet Take 1,000 mg by mouth 3 (three) times daily.    Marland Kitchen amLODipine (NORVASC)  5 MG tablet Take 5 mg by mouth daily.    . calcium carbonate 100 mg/ml SUSP Take by mouth.    . Cyanocobalamin (VITAMIN B-12 PO) Take 1,000 mcg by mouth daily.    Marland Kitchen dexamethasone (DECADRON) 1 MG tablet Take 2 tablets (2 mg total) by mouth daily with breakfast. 60 tablet 0  . Elastic Bandages & Supports (MEDICAL COMPRESSION STOCKINGS) MISC 2 each by Does not apply route daily. 2 each 1  . estradiol (ESTRACE) 1 MG tablet Take 1 mg by mouth daily.    . Fluticasone-Salmeterol (ADVAIR DISKUS) 250-50 MCG/DOSE AEPB Inhale into the lungs.    . gabapentin (NEURONTIN) 100 MG capsule Take 100 mg by mouth 3 (three) times daily.     . hydroxychloroquine (PLAQUENIL) 200 MG tablet Take 200 mg by mouth 2 (two) times daily.     Marland Kitchen lidocaine-prilocaine (EMLA) cream Apply 1 application topically as needed. 30 g 3  . LORazepam (ATIVAN) 0.5 MG tablet Take 0.5 mg every 6 (six) hours as needed by mouth.  0  . metFORMIN (GLUCOPHAGE) 500 MG tablet Take 500 mg by mouth 2 (two) times daily with a meal.    . ondansetron (ZOFRAN) 8 MG tablet Take 1 tablet (8 mg total) by mouth every 12 (twelve) hours as needed. 30 tablet 3  . oxyCODONE (OXY IR/ROXICODONE) 5 MG immediate release tablet Take 1 tablet (5 mg total) by mouth every 6 (six) hours as needed. 120 tablet 0  . pantoprazole (PROTONIX) 40 MG tablet Take 40 mg by mouth daily.    . polyethylene glycol powder (GLYCOLAX/MIRALAX) powder     . potassium chloride SA (K-DUR,KLOR-CON) 20 MEQ tablet Take 1 tablet (20 mEq total) by mouth daily as needed (low potassium). 30 tablet 0  . prednisoLONE (PRELONE) 15 MG/5ML SOLN     . prochlorperazine (COMPAZINE) 10 MG tablet Take 1 tablet (10 mg total) by mouth every 6 (six) hours as needed. 30 tablet 3  . senna (SENOKOT) 8.6 MG TABS tablet Take 2 tablets (17.2 mg total) by mouth daily. Hold for loose stools. 120 each 3  . sodium chloride 0.9 % injection Flush each biliary drains (2) with 10 mL two times daily.    . Sodium Chloride Flush  (NORMAL SALINE FLUSH) 0.9 % SOLN 1 mL by Other route every 8 (eight) hours. Flush each biliary drain with 1 mL twice daily 84 Syringe 3  . triamcinolone ointment (KENALOG) 0.1 % Apply 1 application topically 2 (two) times daily. 80 g 0  . venlafaxine XR (EFFEXOR-XR) 75 MG 24 hr capsule Take 225 mg by mouth daily.     Marland Kitchen zolpidem (AMBIEN) 10 MG  tablet Take 1 tablet (10 mg total) by mouth at bedtime as needed. 30 tablet 3   No current facility-administered medications for this visit.       Marland Kitchen  PHYSICAL EXAMINATION: ECOG PERFORMANCE STATUS: 2 - Symptomatic, <50% confined to bed Vitals:   04/06/17 1335  BP: 130/77  Pulse: (!) 101  Temp: 97.7 F (36.5 C)   Filed Weights   04/06/17 1335  Weight: 150 lb 6 oz (68.2 kg)   Physical Exam  Constitutional: She is oriented to person, place, and time. No distress.  HENT:  Head: Normocephalic and atraumatic.  Mouth/Throat: No oropharyngeal exudate.  Eyes: EOM are normal. Pupils are equal, round, and reactive to light. No scleral icterus.  Neck: Normal range of motion. Neck supple.  Cardiovascular: Normal rate.  No murmur heard. Tachycardic  Pulmonary/Chest: Effort normal and breath sounds normal. No respiratory distress. She has no wheezes.  Abdominal: Soft. Bowel sounds are normal. She exhibits no distension. There is no tenderness.  Musculoskeletal: Normal range of motion. She exhibits edema. She exhibits no tenderness.  1+ pitting edema.  Neurological: She is oriented to person, place, and time. No cranial nerve deficit.  Skin: Skin is dry. Rash noted. No pallor.  Psychiatric: Affect and judgment normal.   .    LABORATORY DATA:  I have reviewed the data as listed Lab Results  Component Value Date   WBC 9.9 03/31/2017   HGB 9.9 (L) 03/31/2017   HCT 30.1 (L) 03/31/2017   MCV 97.4 03/31/2017   PLT 171 03/31/2017   Recent Labs    03/23/17 1007 03/31/17 1315 03/31/17 1420  NA 133* 134* 136  K 3.4* 3.9 4.0  CL 92* 98* 98*    CO2 32 27 27  GLUCOSE 169* 157* 152*  BUN 13 16 16   CREATININE 0.61 0.77 0.73  CALCIUM 8.7* 9.2 9.4  GFRNONAA >60 >60 >60  GFRAA >60 >60 >60  PROT 6.6 7.0 6.8  ALBUMIN 3.0* 2.9* 3.0*  AST 52* 67* 66*  ALT 55* 54 55*  ALKPHOS 305* 371* 360*  BILITOT 0.9 0.7 0.4   [facility] CA 19-9   09/14/16 220 --> 09/28/16 176--> 10/12/16 167-->12/07/2016 64-->12/14/2016 60. --> 12/29/2016 87--> 01/26/2017 99  RADIOGRAPHIC STUDIES: I have personally reviewed the radiological images as listed and agreed with the findings in the report. CT 12/14/2016 [facility] system Impression: 1. Slight interval decrease in size of the left hepatic lobe cholangiocarcinoma. Slight interval decrease in size of the hypoattenuating left hepatic lobe lesion which now measures 3.6 x 2.8 cm, previously 3.9 x 2.9 cm 2. Slight interval increase in size of a porta hepatis lymph node. Other nodes are slightly smaller. 3. Multiple bilateral thyroid nodules. Recommend ultrasound for further evaluation. 4. Stable mediastinal lymph nodes.  CT chest abdomen pelvis 02/24/2017 comparing to 5/918 Study in [facility].  1. No acute findings within the chest, abdomen or pelvis. 2. There has been interval decrease in size of mass associated with left lobe of liver. No specific findings identified to suggest metastatic disease.Mass within the anterior and central aspect of the left lobe appears decreased in size from previous exam measuring 3.0 x 3.2 cm, 3. Bilateral percutaneous biliary drainage catheters are in place without significant biliary ductal dilatation. 4. Bilateral pleural calcifications and chronic interstitial changes of pulmonary fibrosis. Prominent mediastinal lymph nodes are nonspecific in the setting of interstitial lung disease.   ASSESSMENT & PLAN:  Cancer Staging Cholangiocarcinoma [facility]) Staging form: Intrahepatic Bile  Duct, AJCC 8th Edition - Clinical stage from 12/29/2016: Stage IV (cT1, cNX,  pM1) - Signed by Earlie Server, MD on 12/29/2016  1. Cholangiocarcinoma ([place])   2. Encounter for antineoplastic chemotherapy   3. Right upper quadrant abdominal pain    # Will proceed with today's gemcitabine treatment. #Abdominal pain: Resolved. Not clear the etiology. Patient's PPD drain was able to be flushed at her [facility] oncologist office. We will obtain repeat imaged with CT abdomen pelvis with contrast to evaluate treatment response.   # Macrocytic anemia likely gemcitabine side effects... T24 and folic level pending. Hb improved to 9.9,  she is status post 1 dose of procrit use.     # Skin rash/lupus, advise patient to follow up with Rhematology. On Plaquenil and Dexamethasone 61m.       # hyperglycemia, continue monitor. On Metformin 5036mBID,  # I have previously refer the patient to palliative care medicine for symptom control. Patient understands that there is no cure for her disease and treatment is with palliative intent.  # Anemia improved, likely due to not being on treatment for about 2-3 weeks.  All questions were answered. The patient knows to call the clinic with any problems questions or concerns.  Return of visit: 1 week with day 8 Gemzar.  ZhEarlie ServerMD, PhD Hematology Oncology CoJewell County Hospitalt AlThe Monroe Clinicager- [phone]/18/2018

## 2017-04-06 ENCOUNTER — Other Ambulatory Visit: Payer: Self-pay | Admitting: Internal Medicine

## 2017-04-06 ENCOUNTER — Inpatient Hospital Stay: Payer: PPO

## 2017-04-06 ENCOUNTER — Encounter: Payer: Self-pay | Admitting: Oncology

## 2017-04-06 ENCOUNTER — Telehealth: Payer: Self-pay

## 2017-04-06 ENCOUNTER — Other Ambulatory Visit: Payer: Self-pay

## 2017-04-06 ENCOUNTER — Inpatient Hospital Stay (HOSPITAL_BASED_OUTPATIENT_CLINIC_OR_DEPARTMENT_OTHER): Payer: PPO | Admitting: Oncology

## 2017-04-06 VITALS — BP 130/77 | HR 101 | Temp 97.7°F | Wt 150.4 lb

## 2017-04-06 DIAGNOSIS — R1011 Right upper quadrant pain: Secondary | ICD-10-CM | POA: Diagnosis not present

## 2017-04-06 DIAGNOSIS — C221 Intrahepatic bile duct carcinoma: Secondary | ICD-10-CM

## 2017-04-06 DIAGNOSIS — M129 Arthropathy, unspecified: Secondary | ICD-10-CM

## 2017-04-06 DIAGNOSIS — K5190 Ulcerative colitis, unspecified, without complications: Secondary | ICD-10-CM

## 2017-04-06 DIAGNOSIS — K5900 Constipation, unspecified: Secondary | ICD-10-CM

## 2017-04-06 DIAGNOSIS — J449 Chronic obstructive pulmonary disease, unspecified: Secondary | ICD-10-CM

## 2017-04-06 DIAGNOSIS — Z7984 Long term (current) use of oral hypoglycemic drugs: Secondary | ICD-10-CM

## 2017-04-06 DIAGNOSIS — R21 Rash and other nonspecific skin eruption: Secondary | ICD-10-CM | POA: Diagnosis not present

## 2017-04-06 DIAGNOSIS — J8410 Pulmonary fibrosis, unspecified: Secondary | ICD-10-CM | POA: Diagnosis not present

## 2017-04-06 DIAGNOSIS — R531 Weakness: Secondary | ICD-10-CM

## 2017-04-06 DIAGNOSIS — R530 Neoplastic (malignant) related fatigue: Secondary | ICD-10-CM | POA: Diagnosis not present

## 2017-04-06 DIAGNOSIS — K1379 Other lesions of oral mucosa: Secondary | ICD-10-CM | POA: Diagnosis not present

## 2017-04-06 DIAGNOSIS — F329 Major depressive disorder, single episode, unspecified: Secondary | ICD-10-CM

## 2017-04-06 DIAGNOSIS — Z5111 Encounter for antineoplastic chemotherapy: Secondary | ICD-10-CM | POA: Diagnosis not present

## 2017-04-06 DIAGNOSIS — M329 Systemic lupus erythematosus, unspecified: Secondary | ICD-10-CM

## 2017-04-06 DIAGNOSIS — E042 Nontoxic multinodular goiter: Secondary | ICD-10-CM | POA: Diagnosis not present

## 2017-04-06 DIAGNOSIS — M5136 Other intervertebral disc degeneration, lumbar region: Secondary | ICD-10-CM

## 2017-04-06 DIAGNOSIS — R112 Nausea with vomiting, unspecified: Secondary | ICD-10-CM

## 2017-04-06 DIAGNOSIS — D6481 Anemia due to antineoplastic chemotherapy: Secondary | ICD-10-CM

## 2017-04-06 DIAGNOSIS — E1165 Type 2 diabetes mellitus with hyperglycemia: Secondary | ICD-10-CM

## 2017-04-06 DIAGNOSIS — T451X5A Adverse effect of antineoplastic and immunosuppressive drugs, initial encounter: Secondary | ICD-10-CM

## 2017-04-06 DIAGNOSIS — Z87891 Personal history of nicotine dependence: Secondary | ICD-10-CM

## 2017-04-06 DIAGNOSIS — E119 Type 2 diabetes mellitus without complications: Secondary | ICD-10-CM

## 2017-04-06 DIAGNOSIS — I129 Hypertensive chronic kidney disease with stage 1 through stage 4 chronic kidney disease, or unspecified chronic kidney disease: Secondary | ICD-10-CM

## 2017-04-06 DIAGNOSIS — R0602 Shortness of breath: Secondary | ICD-10-CM

## 2017-04-06 DIAGNOSIS — Z79899 Other long term (current) drug therapy: Secondary | ICD-10-CM

## 2017-04-06 DIAGNOSIS — I998 Other disorder of circulatory system: Secondary | ICD-10-CM

## 2017-04-06 DIAGNOSIS — Z9221 Personal history of antineoplastic chemotherapy: Secondary | ICD-10-CM

## 2017-04-06 DIAGNOSIS — K219 Gastro-esophageal reflux disease without esophagitis: Secondary | ICD-10-CM

## 2017-04-06 LAB — CBC WITH DIFFERENTIAL/PLATELET
Basophils Absolute: 0.1 10*3/uL (ref 0–0.1)
Basophils Relative: 1 %
Eosinophils Absolute: 0.1 10*3/uL (ref 0–0.7)
Eosinophils Relative: 2 %
HCT: 30.9 % — ABNORMAL LOW (ref 35.0–47.0)
Hemoglobin: 10 g/dL — ABNORMAL LOW (ref 12.0–16.0)
Lymphocytes Relative: 8 %
Lymphs Abs: 0.7 10*3/uL — ABNORMAL LOW (ref 1.0–3.6)
MCH: 31.4 pg (ref 26.0–34.0)
MCHC: 32.2 g/dL (ref 32.0–36.0)
MCV: 97.5 fL (ref 80.0–100.0)
Monocytes Absolute: 0.7 10*3/uL (ref 0.2–0.9)
Monocytes Relative: 9 %
Neutro Abs: 6.8 10*3/uL — ABNORMAL HIGH (ref 1.4–6.5)
Neutrophils Relative %: 80 %
Platelets: 187 10*3/uL (ref 150–440)
RBC: 3.17 MIL/uL — ABNORMAL LOW (ref 3.80–5.20)
RDW: 15.8 % — ABNORMAL HIGH (ref 11.5–14.5)
WBC: 8.3 10*3/uL (ref 3.6–11.0)

## 2017-04-06 LAB — COMPREHENSIVE METABOLIC PANEL
ALT: 54 U/L (ref 14–54)
AST: 70 U/L — ABNORMAL HIGH (ref 15–41)
Albumin: 3 g/dL — ABNORMAL LOW (ref 3.5–5.0)
Alkaline Phosphatase: 370 U/L — ABNORMAL HIGH (ref 38–126)
Anion gap: 10 (ref 5–15)
BUN: 20 mg/dL (ref 6–20)
CO2: 27 mmol/L (ref 22–32)
Calcium: 9 mg/dL (ref 8.9–10.3)
Chloride: 97 mmol/L — ABNORMAL LOW (ref 101–111)
Creatinine, Ser: 0.85 mg/dL (ref 0.44–1.00)
GFR calc Af Amer: >60 mL/min (ref >60)
GFR calc non Af Amer: >60 mL/min (ref >60)
Glucose, Bld: 201 mg/dL — ABNORMAL HIGH (ref 65–99)
Potassium: 3.8 mmol/L (ref 3.5–5.1)
Sodium: 134 mmol/L — ABNORMAL LOW (ref 135–145)
Total Bilirubin: 0.6 mg/dL (ref 0.3–1.2)
Total Protein: 6.9 g/dL (ref 6.5–8.1)

## 2017-04-06 MED ORDER — SODIUM CHLORIDE 0.9 % IV SOLN: 1400.0000 mg | Freq: Once | INTRAVENOUS | Status: DC

## 2017-04-06 MED ORDER — SODIUM CHLORIDE 0.9% FLUSH
10.0000 mL | INTRAVENOUS | Status: DC | PRN
  Administered 2017-04-06: 10 mL
  Filled 2017-04-06: qty 10

## 2017-04-06 MED ORDER — ONDANSETRON 8 MG PO TBDP
8.0000 mg | ORAL_TABLET | Freq: Once | ORAL | Status: AC
  Administered 2017-04-06: 8 mg via ORAL
  Filled 2017-04-06: qty 1

## 2017-04-06 MED ORDER — SODIUM CHLORIDE 0.9 % IV SOLN
1400.0000 mg | Freq: Once | INTRAVENOUS | Status: AC
  Administered 2017-04-06: 1400 mg via INTRAVENOUS
  Filled 2017-04-06: qty 26.3

## 2017-04-06 MED ORDER — HEPARIN SOD (PORK) LOCK FLUSH 100 UNIT/ML IV SOLN
500.0000 [IU] | Freq: Once | INTRAVENOUS | Status: AC | PRN
  Administered 2017-04-06: 500 [IU]
  Filled 2017-04-06: qty 5

## 2017-04-06 MED ORDER — SODIUM CHLORIDE 0.9 % IV SOLN
Freq: Once | INTRAVENOUS | Status: AC
  Administered 2017-04-06: 14:00:00 via INTRAVENOUS
  Filled 2017-04-06: qty 1000

## 2017-04-06 NOTE — Telephone Encounter (Signed)
Received Referral from Dr. Doy Hutching to scheduled asap appt for patient to see Dr. Mortimer Fries   LMOV to schedule an appt

## 2017-04-06 NOTE — Progress Notes (Signed)
Patient here today for follow up.  Patient c/o pain around billiary tube site on the right side

## 2017-04-07 ENCOUNTER — Ambulatory Visit: Payer: Self-pay | Admitting: Oncology

## 2017-04-07 ENCOUNTER — Other Ambulatory Visit: Payer: Self-pay

## 2017-04-07 DIAGNOSIS — R160 Hepatomegaly, not elsewhere classified: Secondary | ICD-10-CM | POA: Diagnosis not present

## 2017-04-07 DIAGNOSIS — C249 Malignant neoplasm of biliary tract, unspecified: Secondary | ICD-10-CM | POA: Diagnosis not present

## 2017-04-13 NOTE — Progress Notes (Signed)
Hematology/Oncology Follow up note [facility] [phone] [phone]  Patient Care Team: Idelle Crouch, MD as PCP - General (Internal Medicine) REASON FOR VISIT Follow up for treatment of cholangiocarcinoma  PERTINENT ONCOLOGY HISTORY Regina Hood 76 y.o.  female with PMH listed as below, most significantly for cholangiocarcinoma who has been on chemotherapy treatment presents to transfer her cancer care to our cancer center.  After extensive medical record review, summary of her diagnosis and treatment history are listed below.  1. Initially presented in 07/2016 to an outside ED with lower and upper GI bleed.  A. She was found to have new LFTs elevations including AST 340, ALT 379, Alk phos 420 and t bili 0.9.   B. [place] was obtained, which did not visualize the left lobe.  C. She was evaluated by gastroenterology because of her history of UC and underwent colonoscopy, which was unremarkable per path report.  2. 08/20/16, CT noted diffuse atrophy of the left lobe with left intrahepatic biliary ductal dilatation, with 4.1 cm ill-defined low-attenuation mass. This mass was suspicious for malignancy.  3. 08/2016, MRI/MRCP which showed abnormal left hepatic lobe with diffuse biliary dilatation with a shrunken left hepatic lobe and a 2.42.9 cm oval-shaped masslike component displacing the adjacent bile ducts. There is an approximately 1.6 cm segment truncated bile ducts involving common hepatic duct and right and left hepatic although no dilatation of the right hepatic duct system was seen.   4. 09/02/16, ERCP showed localized biliary stricture in the left intrahepatic duct with dilation of the left intrahepatic branches. Sphincterotomy was performed and a stent was placed. EUS with a 3.3 cm mass and left duct dilation.  5. Repeat ERCP was done 09/10/16, during which the stent was exchanged.  6. 09/20/16, taken to OR and noted to a have an peritoneal  implant with biopsy c/w adenocarcinoma. The neoplastic cells are positive for CK7. They are negative for CK20, CDX-2, TTF-1, Napsin A, hep-par1, glypican 3, and arginase. The immunohistochemical features are consistent with adenocarcinoma of upper gastrointestinal tract or pancreaticobiliary origin.  7. 09/28/16, initially seen in medical oncology clinic by Dr. Filomena Jungling 8. 09/29/16, admitted for biliary obstruction and cholangitis, s/p PBD placement, d/c 10/06/16 9. 10/12/16, initiated on gemcitabine (400 mg/m2). CA 19-9 167 10. 10/26/16, increased gemcitabine (800 mg/m2) 11. 11/23/2016 Cycle 3 Gemzar (865m/m2), 12/07/2016, Gemzar (8050mm2) added cisplatin 2547m2  12. 12/14/2016, CT CAP shows slight interval decrease in size of L hepatic lobe cholangio. Slight interval increase in size of porta hepatis lymph node whiel other nodes are slightly smaller. Multiple bilateral thyroid nodules. Stable mediastinal lymph nodes. CA 19-9 60 14  8/30 Cycle 4 Gemzar (800m62m) added cisplatin 20mg66m 15, 12/28/2016 Gemcitabine 800mg/63mcisplatin was hold due to anemia. S/p 1 PRBC transfusion.   [facility]   [facility] [place]    She forgot her appointment on day 8 and Gemcitabine 800mg/m81md cisplatin 20mg/m213m given  Day 9 of cycle 4 (01/05/2017).        01/19/2017  Cycle 5 Day 1Gemcitabine 800mg/m2,30mplatin 20mg/m2, 25munit blood transfusion.        01/26/2017 Cycle 5 Day 8 Gemcitabine 800mg/m2, c41matin 20mg/m2   125me has been on Dexamethasone 4mg daily fo16mkin rash since this [facility] 9/264m018. Recently decreased to 1 mg daily due to hyperglycemia.  INTERVAL HISTORY  Regina Hood 76 y.o.  female with above history reviewed by me today who presents for evaluation prior to Gemcitabine chemotherapy. . Her last treatment was on 04/06/2017. Patient reports feeling fatigued, appetite is okay.She continues to have rash, she takes  plaquenil and dexamethasone 2 mg daily.  Denies any bleeding events. She has ecchymosis on her right forearm which was secondary to IV placement in ER 2 weeks ago. She reports that her right upper quadrant pain has improved and resolved.  Review of Systems  Constitutional: Positive for fatigue. Negative for appetite change, chills, fever and unexpected weight change.  HENT:   Negative for hearing loss, lump/mass and mouth sores.   Eyes: Negative for eye problems.  Respiratory: Negative for chest tightness and cough.   Cardiovascular: Negative for chest pain and leg swelling.  Gastrointestinal: Negative for abdominal distention, abdominal pain and blood in stool.  Endocrine: Negative for hot flashes.  Genitourinary: Negative for difficulty urinating and dysuria.   Musculoskeletal: Negative for arthralgias and gait problem.  Skin: Positive for rash. Negative for itching.       Skin inflammation at the suture site of percutaneous bile ducts draining.  Neurological: Negative for dizziness and gait problem.  Hematological: Negative for adenopathy. Does not bruise/bleed easily.  Psychiatric/Behavioral: Negative for confusion. The patient is not nervous/anxious.    MEDICAL HISTORY:  Past Medical History:  Diagnosis Date  . Arthritis   . Cancer ([place])   . Cellulitis   . Collagen vascular disease ([place])   . COPD (chronic obstructive pulmonary disease) ([place])   . DDD (degenerative disc disease), lumbar   . Depression   . Diabetes mellitus   . Diverticulitis   . GERD (gastroesophageal reflux disease)   . GI bleed   . Headache(784.0)   . Hypertension   . Lupus   . Ulcerative colitis ([place])     SURGICAL HISTORY: Past Surgical History:  Procedure Laterality Date  . ABDOMINAL HYSTERECTOMY    . BREAST BIOPSY Right   . COLONOSCOPY    . COLONOSCOPY WITH PROPOFOL N/A 08/18/2016   Procedure: COLONOSCOPY WITH PROPOFOL;  Surgeon: Manya Silvas, MD;  Location: [facility] ENDOSCOPY;  Service:  Endoscopy;  Laterality: N/A;  . ESOPHAGOGASTRODUODENOSCOPY (EGD) WITH PROPOFOL N/A 08/18/2016   Procedure: ESOPHAGOGASTRODUODENOSCOPY (EGD) WITH PROPOFOL;  Surgeon: Manya Silvas, MD;  Location: [facility] ENDOSCOPY;  Service: Endoscopy;  Laterality: N/A;  . ORIF HUMERUS FRACTURE  09/17/2011   Procedure: OPEN REDUCTION INTERNAL FIXATION (ORIF) PROXIMAL HUMERUS FRACTURE;  Surgeon: Augustin Schooling, MD;  Location: [place];  Service: Orthopedics;  Laterality: Left;    SOCIAL HISTORY: Social History   Socioeconomic History  . Marital status: Widowed    Spouse name: Not on file  . Number of children: Not on file  . Years of education: Not on file  . Highest education level: Not on file  Social Needs  . Financial resource strain: Not on file  . Food insecurity - worry: Not on file  . Food insecurity - inability: Not on file  . Transportation needs - medical: Not on file  . Transportation needs - non-medical: Not on file  Occupational History  . Not on file  Tobacco Use  . Smoking status: Former Smoker    Packs/day: 1.00    Years: 10.00    Pack years: 10.00    Types: Cigarettes    Last attempt to quit: 12/30/1998    Years since quitting: 18.2  . Smokeless tobacco: Never Used  . Tobacco  comment: quit smoking in 2003  Substance and Sexual Activity  . Alcohol use: No  . Drug use: No  . Sexual activity: Not on file  Other Topics Concern  . Not on file  Social History Narrative  . Not on file    FAMILY HISTORY: Family History  Problem Relation Age of Onset  . Breast cancer Mother 24  . COPD Father   . Prostate cancer Brother     ALLERGIES:  is allergic to feldene [piroxicam]; penicillamine; and qualaquin [quinine].  MEDICATIONS:  Current Outpatient Medications  Medication Sig Dispense Refill  . acetaminophen (TYLENOL) 500 MG tablet Take 1,000 mg by mouth 3 (three) times daily.    Marland Kitchen amLODipine (NORVASC) 5 MG tablet Take 5 mg by mouth daily.    . calcium carbonate 100 mg/ml SUSP  Take by mouth.    . Cyanocobalamin (VITAMIN B-12 PO) Take 1,000 mcg by mouth daily.    Marland Kitchen dexamethasone (DECADRON) 1 MG tablet Take 2 tablets (2 mg total) by mouth daily with breakfast. 60 tablet 0  . Elastic Bandages & Supports (MEDICAL COMPRESSION STOCKINGS) MISC 2 each by Does not apply route daily. 2 each 1  . estradiol (ESTRACE) 1 MG tablet Take 1 mg by mouth daily.    . Fluticasone-Salmeterol (ADVAIR DISKUS) 250-50 MCG/DOSE AEPB Inhale into the lungs.    . gabapentin (NEURONTIN) 100 MG capsule Take 100 mg by mouth 3 (three) times daily.     . hydroxychloroquine (PLAQUENIL) 200 MG tablet Take 200 mg by mouth 2 (two) times daily.     Marland Kitchen lidocaine-prilocaine (EMLA) cream Apply 1 application topically as needed. 30 g 3  . LORazepam (ATIVAN) 0.5 MG tablet Take 0.5 mg every 6 (six) hours as needed by mouth.  0  . metFORMIN (GLUCOPHAGE) 500 MG tablet Take 500 mg by mouth 2 (two) times daily with a meal.    . ondansetron (ZOFRAN) 8 MG tablet TAKE ONE TABLET EVERY 12 HOURS AS NEEDED 30 tablet 3  . oxyCODONE (OXY IR/ROXICODONE) 5 MG immediate release tablet Take 1 tablet (5 mg total) by mouth every 6 (six) hours as needed. 120 tablet 0  . pantoprazole (PROTONIX) 40 MG tablet Take 40 mg by mouth daily.    . polyethylene glycol powder (GLYCOLAX/MIRALAX) powder     . potassium chloride SA (K-DUR,KLOR-CON) 20 MEQ tablet Take 1 tablet (20 mEq total) by mouth daily as needed (low potassium). 30 tablet 0  . prednisoLONE (PRELONE) 15 MG/5ML SOLN     . prochlorperazine (COMPAZINE) 10 MG tablet Take 1 tablet (10 mg total) by mouth every 6 (six) hours as needed. 30 tablet 3  . senna (SENOKOT) 8.6 MG TABS tablet Take 2 tablets (17.2 mg total) by mouth daily. Hold for loose stools. 120 each 3  . sodium chloride 0.9 % injection Flush each biliary drains (2) with 10 mL two times daily.    . Sodium Chloride Flush (NORMAL SALINE FLUSH) 0.9 % SOLN 1 mL by Other route every 8 (eight) hours. Flush each biliary drain with  1 mL twice daily 84 Syringe 3  . triamcinolone ointment (KENALOG) 0.1 % Apply 1 application topically 2 (two) times daily. 80 g 0  . venlafaxine XR (EFFEXOR-XR) 75 MG 24 hr capsule Take 225 mg by mouth daily.     Marland Kitchen zolpidem (AMBIEN) 10 MG tablet Take 1 tablet (10 mg total) by mouth at bedtime as needed. 30 tablet 3   No current facility-administered medications for this visit.       Marland Kitchen  PHYSICAL EXAMINATION: ECOG PERFORMANCE STATUS: 2 - Symptomatic, <50% confined to bed There were no vitals filed for this visit. There were no vitals filed for this visit. Physical Exam  Constitutional: She is oriented to person, place, and time and well-developed, well-nourished, and in no distress. No distress.  HENT:  Head: Normocephalic and atraumatic.  Mouth/Throat: No oropharyngeal exudate.  Eyes: EOM are normal. Pupils are equal, round, and reactive to light. No scleral icterus.  Neck: Normal range of motion. Neck supple.  Cardiovascular: Normal rate and normal heart sounds.  No murmur heard. Tachycardic  Pulmonary/Chest: Effort normal and breath sounds normal. No respiratory distress. She has no wheezes.  Abdominal: Soft. Bowel sounds are normal. She exhibits no distension. There is no tenderness. There is no rebound.  Musculoskeletal: Normal range of motion. She exhibits no edema or tenderness.  trace edema.  Lymphadenopathy:    She has no cervical adenopathy.  Neurological: She is oriented to person, place, and time. No cranial nerve deficit.  Skin: Skin is dry. Rash noted. No pallor.  Psychiatric: Affect and judgment normal.   .    LABORATORY DATA:  I have reviewed the data as listed Lab Results  Component Value Date   WBC 8.3 04/06/2017   HGB 10.0 (L) 04/06/2017   HCT 30.9 (L) 04/06/2017   MCV 97.5 04/06/2017   PLT 187 04/06/2017   Recent Labs    03/31/17 1315 03/31/17 1420 04/06/17 1307  NA 134* 136 134*  K 3.9 4.0 3.8  CL 98* 98* 97*  CO2 _0 GLUCOSE 157*  152* 201*  BUN _1 CREATININE 0.77 0.73 0.85  CALCIUM 9.2 9.4 9.0  GFRNONAA >60 >60 >60  GFRAA >60 >60 >60  PROT 7.0 6.8 6.9  ALBUMIN 2.9* 3.0* 3.0*  AST 67* 66* 70*  ALT 54 55* 54  ALKPHOS 371* 360* 370*  BILITOT 0.7 0.4 0.6   [facility] CA 19-9   09/14/16 220 --> 09/28/16 176--> 10/12/16 167-->12/07/2016 64-->12/14/2016 60. --> 12/29/2016 87--> 01/26/2017 99  RADIOGRAPHIC STUDIES: I have personally reviewed the radiological images as listed and agreed with the findings in the report. CT 12/14/2016 [facility] Impression: 1. Slight interval decrease in size of the left hepatic lobe cholangiocarcinoma. Slight interval decrease in size of the hypoattenuating left hepatic lobe lesion which now measures 3.6 x 2.8 cm, previously 3.9 x 2.9 cm 2. Slight interval increase in size of a porta hepatis lymph node. Other nodes are slightly smaller. 3. Multiple bilateral thyroid nodules. Recommend ultrasound for further evaluation. 4. Stable mediastinal lymph nodes.  CT chest abdomen pelvis 02/24/2017 comparing to 5/918 Study in [facility].  1. No acute findings within the chest, abdomen or pelvis. 2. There has been interval decrease in size of mass associated with left lobe of liver. No specific findings identified to suggest metastatic disease.Mass within the anterior and central aspect of the left lobe appears decreased in size from previous exam measuring 3.0 x 3.2 cm, 3. Bilateral percutaneous biliary drainage catheters are in place without significant biliary ductal dilatation. 4. Bilateral pleural calcifications and chronic interstitial changes of pulmonary fibrosis. Prominent mediastinal lymph nodes are nonspecific in the setting of interstitial lung disease.   ASSESSMENT & PLAN:  Cancer Staging Cholangiocarcinoma [facility]) Staging form: Intrahepatic Bile Duct, AJCC 8th Edition - Clinical stage from 12/29/2016: Stage IV (cT1, cNX, pM1) - Signed by Earlie Server, MD on  12/29/2016  1. Thrombocytopenia ([place])   2. Cholangiocarcinoma (  [place])   3. Encounter for antineoplastic chemotherapy   4. Anemia due to antineoplastic chemotherapy    # Will hold today's gemcitabine treatment due to severe thrombocytopenia. She is asymptomatic without any active bleeding. She has not been able to get day 15 of gemcitabine due to cytopenia and fatigue. Discussed with patient. We will switch her gemcitabine treatment plan to every other week, hopefully she can tolerate this treatment better. Patient agrees with the plan. # Thrombocytopenia, likely secondary to chemotherapy. Gemcitabine can cause TTP in rare occasion. Check smear for schistocytes and clumps. # Macocytic anemia likely gemcitabine side effects.. Normal B12 and folate level. # Awaiting CT scan.  # Skin rash/lupus, advise patient to follow up with Rhematology. On Plaquenil and Dexamethasone 25m.      # hyperglycemia, continue monitor. On Metformin 5063mBID,  # Fatigue : Likely related to neoplasm, chemotherapy and anemia.. Thyroid function was checked in November 2018 and was normal.  She has been chronically on dexamethasone [place] dexamethasone [place] she transition her care to me. I have weaned down to 1 mg, however her skin rash got worse. Currently she is back on 2 mg. I'm concerned that was a long-term steroids she will develop steroid myopathy. I recommend her to make an appointment to see her rheumatologist Dr. KeJefm Bryanto see if there is any other treatment she can be placed on for the rash instead of steroids.   # I have previously refer the patient to palliative care medicine for symptom control. Patient understands that there is no cure for her disease and treatment is with palliative intent.    All questions were answered. The patient knows to call the clinic with any problems questions or concerns.  Return of visit: 1 week with Gemzar.  ZhEarlie ServerMD, PhD Hematology Oncology [facility]Cent[facility]- [phone]/26/2018

## 2017-04-14 ENCOUNTER — Inpatient Hospital Stay (HOSPITAL_BASED_OUTPATIENT_CLINIC_OR_DEPARTMENT_OTHER): Payer: PPO | Admitting: Oncology

## 2017-04-14 ENCOUNTER — Encounter: Payer: Self-pay | Admitting: Oncology

## 2017-04-14 ENCOUNTER — Inpatient Hospital Stay: Payer: PPO

## 2017-04-14 DIAGNOSIS — D696 Thrombocytopenia, unspecified: Secondary | ICD-10-CM

## 2017-04-14 DIAGNOSIS — M129 Arthropathy, unspecified: Secondary | ICD-10-CM

## 2017-04-14 DIAGNOSIS — C221 Intrahepatic bile duct carcinoma: Secondary | ICD-10-CM

## 2017-04-14 DIAGNOSIS — T451X5A Adverse effect of antineoplastic and immunosuppressive drugs, initial encounter: Secondary | ICD-10-CM

## 2017-04-14 DIAGNOSIS — K5190 Ulcerative colitis, unspecified, without complications: Secondary | ICD-10-CM

## 2017-04-14 DIAGNOSIS — K1379 Other lesions of oral mucosa: Secondary | ICD-10-CM | POA: Diagnosis not present

## 2017-04-14 DIAGNOSIS — Z79899 Other long term (current) drug therapy: Secondary | ICD-10-CM

## 2017-04-14 DIAGNOSIS — K219 Gastro-esophageal reflux disease without esophagitis: Secondary | ICD-10-CM

## 2017-04-14 DIAGNOSIS — K5900 Constipation, unspecified: Secondary | ICD-10-CM | POA: Diagnosis not present

## 2017-04-14 DIAGNOSIS — Z7984 Long term (current) use of oral hypoglycemic drugs: Secondary | ICD-10-CM

## 2017-04-14 DIAGNOSIS — R1011 Right upper quadrant pain: Secondary | ICD-10-CM | POA: Diagnosis not present

## 2017-04-14 DIAGNOSIS — M329 Systemic lupus erythematosus, unspecified: Secondary | ICD-10-CM

## 2017-04-14 DIAGNOSIS — D6481 Anemia due to antineoplastic chemotherapy: Secondary | ICD-10-CM

## 2017-04-14 DIAGNOSIS — M5136 Other intervertebral disc degeneration, lumbar region: Secondary | ICD-10-CM

## 2017-04-14 DIAGNOSIS — E1165 Type 2 diabetes mellitus with hyperglycemia: Secondary | ICD-10-CM | POA: Diagnosis not present

## 2017-04-14 DIAGNOSIS — R531 Weakness: Secondary | ICD-10-CM

## 2017-04-14 DIAGNOSIS — I129 Hypertensive chronic kidney disease with stage 1 through stage 4 chronic kidney disease, or unspecified chronic kidney disease: Secondary | ICD-10-CM

## 2017-04-14 DIAGNOSIS — Z5111 Encounter for antineoplastic chemotherapy: Secondary | ICD-10-CM

## 2017-04-14 DIAGNOSIS — F329 Major depressive disorder, single episode, unspecified: Secondary | ICD-10-CM

## 2017-04-14 DIAGNOSIS — R0602 Shortness of breath: Secondary | ICD-10-CM | POA: Diagnosis not present

## 2017-04-14 DIAGNOSIS — Z87891 Personal history of nicotine dependence: Secondary | ICD-10-CM

## 2017-04-14 DIAGNOSIS — E119 Type 2 diabetes mellitus without complications: Secondary | ICD-10-CM

## 2017-04-14 DIAGNOSIS — J8410 Pulmonary fibrosis, unspecified: Secondary | ICD-10-CM | POA: Diagnosis not present

## 2017-04-14 DIAGNOSIS — R21 Rash and other nonspecific skin eruption: Secondary | ICD-10-CM

## 2017-04-14 DIAGNOSIS — E042 Nontoxic multinodular goiter: Secondary | ICD-10-CM

## 2017-04-14 DIAGNOSIS — I998 Other disorder of circulatory system: Secondary | ICD-10-CM

## 2017-04-14 DIAGNOSIS — R530 Neoplastic (malignant) related fatigue: Secondary | ICD-10-CM | POA: Diagnosis not present

## 2017-04-14 DIAGNOSIS — Z9221 Personal history of antineoplastic chemotherapy: Secondary | ICD-10-CM

## 2017-04-14 DIAGNOSIS — J449 Chronic obstructive pulmonary disease, unspecified: Secondary | ICD-10-CM

## 2017-04-14 LAB — CBC WITH DIFFERENTIAL/PLATELET
BASOS ABS: 0 10*3/uL (ref 0–0.1)
BASOS PCT: 0 %
Eosinophils Absolute: 0 10*3/uL (ref 0–0.7)
Eosinophils Relative: 1 %
HEMATOCRIT: 27.6 % — AB (ref 35.0–47.0)
Hemoglobin: 9.1 g/dL — ABNORMAL LOW (ref 12.0–16.0)
LYMPHS PCT: 11 %
Lymphs Abs: 0.6 10*3/uL — ABNORMAL LOW (ref 1.0–3.6)
MCH: 31.4 pg (ref 26.0–34.0)
MCHC: 33.1 g/dL (ref 32.0–36.0)
MCV: 94.7 fL (ref 80.0–100.0)
Monocytes Absolute: 0.7 10*3/uL (ref 0.2–0.9)
Monocytes Relative: 12 %
NEUTROS ABS: 4 10*3/uL (ref 1.4–6.5)
Neutrophils Relative %: 76 %
PLATELETS: 36 10*3/uL — AB (ref 150–440)
RBC: 2.91 MIL/uL — AB (ref 3.80–5.20)
RDW: 15.5 % — AB (ref 11.5–14.5)
WBC: 5.3 10*3/uL (ref 3.6–11.0)

## 2017-04-14 LAB — COMPREHENSIVE METABOLIC PANEL
ALBUMIN: 3 g/dL — AB (ref 3.5–5.0)
ALK PHOS: 300 U/L — AB (ref 38–126)
ALT: 117 U/L — ABNORMAL HIGH (ref 14–54)
AST: 99 U/L — ABNORMAL HIGH (ref 15–41)
Anion gap: 8 (ref 5–15)
BILIRUBIN TOTAL: 0.5 mg/dL (ref 0.3–1.2)
BUN: 18 mg/dL (ref 6–20)
CALCIUM: 8.5 mg/dL — AB (ref 8.9–10.3)
CO2: 29 mmol/L (ref 22–32)
Chloride: 98 mmol/L — ABNORMAL LOW (ref 101–111)
Creatinine, Ser: 0.69 mg/dL (ref 0.44–1.00)
GFR calc non Af Amer: >60 mL/min (ref >60)
Glucose, Bld: 178 mg/dL — ABNORMAL HIGH (ref 65–99)
POTASSIUM: 3.4 mmol/L — AB (ref 3.5–5.1)
Sodium: 135 mmol/L (ref 135–145)
TOTAL PROTEIN: 6.7 g/dL (ref 6.5–8.1)

## 2017-04-14 LAB — TECHNOLOGIST SMEAR REVIEW

## 2017-04-14 MED ORDER — HEPARIN SOD (PORK) LOCK FLUSH 100 UNIT/ML IV SOLN
500.0000 [IU] | Freq: Once | INTRAVENOUS | Status: AC
  Administered 2017-04-14: 500 [IU] via INTRAVENOUS
  Filled 2017-04-14: qty 5

## 2017-04-14 MED ORDER — SODIUM CHLORIDE 0.9% FLUSH
10.0000 mL | INTRAVENOUS | Status: DC | PRN
  Administered 2017-04-14: 10 mL via INTRAVENOUS
  Filled 2017-04-14: qty 10

## 2017-04-14 MED ORDER — SODIUM CHLORIDE 0.9 % IV SOLN
1400.0000 mg | Freq: Once | INTRAVENOUS | Status: DC
  Filled 2017-04-14: qty 37

## 2017-04-14 NOTE — Telephone Encounter (Signed)
lmov to schedule appt  °

## 2017-04-18 ENCOUNTER — Ambulatory Visit
Admission: RE | Admit: 2017-04-18 | Discharge: 2017-04-18 | Disposition: A | Discharge status: Home / Self Care | Payer: PPO | Source: Ambulatory Visit | Attending: Oncology | Admitting: Oncology

## 2017-04-18 DIAGNOSIS — Z5111 Encounter for antineoplastic chemotherapy: Secondary | ICD-10-CM

## 2017-04-18 DIAGNOSIS — N83202 Unspecified ovarian cyst, left side: Secondary | ICD-10-CM | POA: Diagnosis not present

## 2017-04-18 DIAGNOSIS — R160 Hepatomegaly, not elsewhere classified: Secondary | ICD-10-CM | POA: Diagnosis not present

## 2017-04-18 DIAGNOSIS — C221 Intrahepatic bile duct carcinoma: Secondary | ICD-10-CM | POA: Diagnosis not present

## 2017-04-18 DIAGNOSIS — R1011 Right upper quadrant pain: Secondary | ICD-10-CM | POA: Diagnosis not present

## 2017-04-18 MED ORDER — IOPAMIDOL (ISOVUE-300) INJECTION 61%
100.0000 mL | Freq: Once | INTRAVENOUS | Status: AC | PRN
  Administered 2017-04-18: 100 mL via INTRAVENOUS

## 2017-04-19 NOTE — Progress Notes (Signed)
Hematology/Oncology Follow up note [facility] [phone] [phone]  Patient Care Team: Idelle Crouch, MD as PCP - General (Internal Medicine) REASON FOR VISIT Follow up for treatment of cholangiocarcinoma  PERTINENT ONCOLOGY HISTORY REGNIA Regina Hood 77 y.o.  female with PMH listed as below, most significantly for cholangiocarcinoma who has been on chemotherapy treatment presents to transfer her cancer care to our cancer center.  After extensive medical record review, summary of her diagnosis and treatment history are listed below.  1. Initially presented in 07/2016 to an outside ED with lower and upper GI bleed.  A. She was found to have new LFTs elevations including AST 340, ALT 379, Alk phos 420 and t bili 0.9.   B. [place] was obtained, which did not visualize the left lobe.  C. She was evaluated by gastroenterology because of her history of UC and underwent colonoscopy, which was unremarkable per path report.  2. 08/20/16, CT noted diffuse atrophy of the left lobe with left intrahepatic biliary ductal dilatation, with 4.1 cm ill-defined low-attenuation mass. This mass was suspicious for malignancy.  3. 08/2016, MRI/MRCP which showed abnormal left hepatic lobe with diffuse biliary dilatation with a shrunken left hepatic lobe and a 2.42.9 cm oval-shaped masslike component displacing the adjacent bile ducts. There is an approximately 1.6 cm segment truncated bile ducts involving common hepatic duct and right and left hepatic although no dilatation of the right hepatic duct system was seen.   4. 09/02/16, ERCP showed localized biliary stricture in the left intrahepatic duct with dilation of the left intrahepatic branches. Sphincterotomy was performed and a stent was placed. EUS with a 3.3 cm mass and left duct dilation.  5. Repeat ERCP was done 09/10/16, during which the stent was exchanged.  6. 09/20/16, taken to OR and noted to a have an peritoneal  implant with biopsy c/w adenocarcinoma. The neoplastic cells are positive for CK7. They are negative for CK20, CDX-2, TTF-1, Napsin A, hep-par1, glypican 3, and arginase. The immunohistochemical features are consistent with adenocarcinoma of upper gastrointestinal tract or pancreaticobiliary origin.  7. 09/28/16, initially seen in medical oncology clinic by Dr. Filomena Jungling 8. 09/29/16, admitted for biliary obstruction and cholangitis, s/p PBD placement, d/c 10/06/16 9. 10/12/16, initiated on gemcitabine (400 mg/m2). CA 19-9 167 10. 10/26/16, increased gemcitabine (800 mg/m2) 11. 11/23/2016 Cycle 3 Gemzar (865m/m2), 12/07/2016, Gemzar (8050mm2) added cisplatin 2547m2  12. 12/14/2016, CT CAP shows slight interval decrease in size of L hepatic lobe cholangio. Slight interval increase in size of porta hepatis lymph node whiel other nodes are slightly smaller. Multiple bilateral thyroid nodules. Stable mediastinal lymph nodes. CA 19-9 60 14  8/30 Cycle 4 Gemzar (800m62m) added cisplatin 20mg66m 15, 12/28/2016 Gemcitabine 800mg/63mcisplatin was hold due to anemia. S/p 1 PRBC transfusion.   [facility]   [facility] to [facility] [place]    She forgot her appointment on day 8 and Gemcitabine 800mg/m81md cisplatin 20mg/m213m given  Day 9 of cycle 4 (01/05/2017).        01/19/2017  Cycle 5 Day 1Gemcitabine 800mg/m2,30mplatin 20mg/m2, 25munit blood transfusion.        01/26/2017 Cycle 5 Day 8 Gemcitabine 800mg/m2, c41matin 20mg/m2   125me has been on Dexamethasone 4mg daily fo16mkin rash since this [facility] since 9/264m018. Recently decreased to 1 mg daily due to hyperglycemia.  INTERVAL HISTORY  Regina Hood 77 y.o.  female with above history reviewed by me today who presents for evaluation prior to Gemcitabine chemotherapy and also discussion of CT results.   Her last treatment was on 04/06/2017. Patient reports feeling sluggish, ongoing fatigue. Her  cutaneous rash has been improved.  she takes plaquenil and dexamethasone 2 mg daily. She reports that one of her PBD is not flushing well again and also is leaking bile. Denies any pain.      Review of Systems  Constitutional: Positive for fatigue. Negative for appetite change, chills, diaphoresis, fever and unexpected weight change.  HENT:   Negative for hearing loss, lump/mass, mouth sores and nosebleeds.   Eyes: Negative for eye problems and icterus.  Respiratory: Negative for chest tightness, cough and hemoptysis.   Cardiovascular: Negative for chest pain and leg swelling.  Gastrointestinal: Negative for abdominal distention, abdominal pain and blood in stool.  Endocrine: Negative for hot flashes.  Genitourinary: Negative for difficulty urinating, dysuria and nocturia.   Musculoskeletal: Negative for arthralgias and gait problem.  Skin: Positive for rash. Negative for itching.       Skin inflammation at the suture site of percutaneous bile ducts draining.  Neurological: Negative for dizziness, gait problem and headaches.  Hematological: Negative for adenopathy. Does not bruise/bleed easily.  Psychiatric/Behavioral: Negative for confusion. The patient is not nervous/anxious.    MEDICAL HISTORY:  Past Medical History:  Diagnosis Date  . Arthritis   . Cancer ([place])   . Cellulitis   . Collagen vascular disease ([place])   . COPD (chronic obstructive pulmonary disease) ([place])   . DDD (degenerative disc disease), lumbar   . Depression   . Diabetes mellitus   . Diverticulitis   . GERD (gastroesophageal reflux disease)   . GI bleed   . Headache(784.0)   . Hypertension   . Lupus   . Ulcerative colitis ([place])     SURGICAL HISTORY: Past Surgical History:  Procedure Laterality Date  . ABDOMINAL HYSTERECTOMY    . BREAST BIOPSY Right   . COLONOSCOPY    . COLONOSCOPY WITH PROPOFOL N/A 08/18/2016   Procedure: COLONOSCOPY WITH PROPOFOL;  Surgeon: Manya Silvas, MD;  Location: [facility]  ENDOSCOPY;  Service: Endoscopy;  Laterality: N/A;  . ESOPHAGOGASTRODUODENOSCOPY (EGD) WITH PROPOFOL N/A 08/18/2016   Procedure: ESOPHAGOGASTRODUODENOSCOPY (EGD) WITH PROPOFOL;  Surgeon: Manya Silvas, MD;  Location: [facility] ENDOSCOPY;  Service: Endoscopy;  Laterality: N/A;  . ORIF HUMERUS FRACTURE  09/17/2011   Procedure: OPEN REDUCTION INTERNAL FIXATION (ORIF) PROXIMAL HUMERUS FRACTURE;  Surgeon: Augustin Schooling, MD;  Location: [place];  Service: Orthopedics;  Laterality: Left;    SOCIAL HISTORY: Social History   Socioeconomic History  . Marital status: Widowed    Spouse name: Not on file  . Number of children: Not on file  . Years of education: Not on file  . Highest education level: Not on file  Social Needs  . Financial resource strain: Not on file  . Food insecurity - worry: Not on file  . Food insecurity - inability: Not on file  . Transportation needs - medical: Not on file  . Transportation needs - non-medical: Not on file  Occupational History  . Not on file  Tobacco Use  . Smoking status: Former Smoker    Packs/day: 1.00    Years: 10.00    Pack years: 10.00    Types: Cigarettes    Last attempt to quit: 12/30/1998    Years since quitting: 18.3  . Smokeless tobacco:  Never Used  . Tobacco comment: quit smoking in 2003  Substance and Sexual Activity  . Alcohol use: No  . Drug use: No  . Sexual activity: Not on file  Other Topics Concern  . Not on file  Social History Narrative  . Not on file    FAMILY HISTORY: Family History  Problem Relation Age of Onset  . Breast cancer Mother 76  . COPD Father   . Prostate cancer Brother     ALLERGIES:  is allergic to feldene [piroxicam]; penicillamine; and qualaquin [quinine].  MEDICATIONS:  Current Outpatient Medications  Medication Sig Dispense Refill  . acetaminophen (TYLENOL) 500 MG tablet Take 1,000 mg by mouth 3 (three) times daily.    Marland Kitchen amLODipine (NORVASC) 5 MG tablet Take 5 mg by mouth daily.    . calcium  carbonate 100 mg/ml SUSP Take by mouth.    . Cyanocobalamin (VITAMIN B-12 PO) Take 1,000 mcg by mouth daily.    Marland Kitchen dexamethasone (DECADRON) 1 MG tablet Take 2 tablets (2 mg total) by mouth daily with breakfast. 60 tablet 0  . Elastic Bandages & Supports (MEDICAL COMPRESSION STOCKINGS) MISC 2 each by Does not apply route daily. 2 each 1  . estradiol (ESTRACE) 1 MG tablet Take 1 mg by mouth daily.    . Fluticasone-Salmeterol (ADVAIR DISKUS) 250-50 MCG/DOSE AEPB Inhale into the lungs.    . gabapentin (NEURONTIN) 100 MG capsule Take 100 mg by mouth 3 (three) times daily.     . hydroxychloroquine (PLAQUENIL) 200 MG tablet Take 200 mg by mouth 2 (two) times daily.     Marland Kitchen lidocaine-prilocaine (EMLA) cream Apply 1 application topically as needed. 30 g 3  . LORazepam (ATIVAN) 0.5 MG tablet Take 0.5 mg every 6 (six) hours as needed by mouth.  0  . metFORMIN (GLUCOPHAGE) 500 MG tablet Take 500 mg by mouth 2 (two) times daily with a meal.    . ondansetron (ZOFRAN) 8 MG tablet TAKE ONE TABLET EVERY 12 HOURS AS NEEDED 30 tablet 3  . oxyCODONE (OXY IR/ROXICODONE) 5 MG immediate release tablet Take 1 tablet (5 mg total) by mouth every 6 (six) hours as needed. 120 tablet 0  . pantoprazole (PROTONIX) 40 MG tablet Take 40 mg by mouth daily.    . polyethylene glycol powder (GLYCOLAX/MIRALAX) powder     . potassium chloride SA (K-DUR,KLOR-CON) 20 MEQ tablet Take 1 tablet (20 mEq total) by mouth daily as needed (low potassium). 30 tablet 0  . prochlorperazine (COMPAZINE) 10 MG tablet Take 1 tablet (10 mg total) by mouth every 6 (six) hours as needed. 30 tablet 3  . senna (SENOKOT) 8.6 MG TABS tablet Take 2 tablets (17.2 mg total) by mouth daily. Hold for loose stools. 120 each 3  . sodium chloride 0.9 % injection Flush each biliary drains (2) with 10 mL two times daily.    . Sodium Chloride Flush (NORMAL SALINE FLUSH) 0.9 % SOLN 1 mL by Other route every 8 (eight) hours. Flush each biliary drain with 1 mL twice daily 84  Syringe 3  . triamcinolone ointment (KENALOG) 0.1 % Apply 1 application topically 2 (two) times daily. 80 g 0  . venlafaxine XR (EFFEXOR-XR) 75 MG 24 hr capsule Take 225 mg by mouth daily.     Marland Kitchen zolpidem (AMBIEN) 10 MG tablet Take 1 tablet (10 mg total) by mouth at bedtime as needed. 30 tablet 3   No current facility-administered medications for this visit.       Marland Kitchen  PHYSICAL EXAMINATION: ECOG PERFORMANCE STATUS: 2 - Symptomatic, <50% confined to bed Vitals:   04/20/17 1354  BP: 129/66  Pulse: 97  Resp: 18  Temp: (!) 96.4 F (35.8 C)   Filed Weights   04/20/17 1354  Weight: 154 lb 12.8 oz (70.2 kg)   Physical Exam  Constitutional: She is oriented to person, place, and time and well-developed, well-nourished, and in no distress. No distress.  HENT:  Head: Normocephalic and atraumatic.  Mouth/Throat: No oropharyngeal exudate.  Eyes: EOM are normal. Pupils are equal, round, and reactive to light. Left eye exhibits no discharge. No scleral icterus.  Neck: Normal range of motion. Neck supple. No JVD present.  Cardiovascular: Normal rate and normal heart sounds.  No murmur heard. Pulmonary/Chest: Effort normal. No respiratory distress. She has no wheezes. She has no rales.  Abdominal: Soft. Bowel sounds are normal. She exhibits no distension. There is no tenderness. There is no rebound.  Musculoskeletal: Normal range of motion. She exhibits no edema or tenderness.  trace edema.  Lymphadenopathy:    She has no cervical adenopathy.  Neurological: She is oriented to person, place, and time. She displays normal reflexes. No cranial nerve deficit.  Skin: Skin is dry. No rash noted. No pallor.  Rash has improved.   Psychiatric: Affect and judgment normal.   .    LABORATORY DATA:  I have reviewed the data as listed Lab Results  Component Value Date   WBC 5.3 04/14/2017   HGB 9.1 (L) 04/14/2017   HCT 27.6 (L) 04/14/2017   MCV 94.7 04/14/2017   PLT 36 (L) 04/14/2017    Recent Labs    03/31/17 1420 04/06/17 1307 04/14/17 1016  NA 136 134* 135  K 4.0 3.8 3.4*  CL 98* 97* 98*  CO2 _0 GLUCOSE 152* 201* 178*  BUN _1 CREATININE 0.73 0.85 0.69  CALCIUM 9.4 9.0 8.5*  GFRNONAA >60 >60 >60  GFRAA >60 >60 >60  PROT 6.8 6.9 6.7  ALBUMIN 3.0* 3.0* 3.0*  AST 66* 70* 99*  ALT 55* 54 117*  ALKPHOS 360* 370* 300*  BILITOT 0.4 0.6 0.5   [facility] CA 19-9   09/14/16 220 --> 09/28/16 176--> 10/12/16 167-->12/07/2016 64-->12/14/2016 60. --> 12/29/2016 87--> 01/26/2017 99-->03/02/2017 53-->03/23/2017 72  RADIOGRAPHIC STUDIES: I have personally reviewed the radiological images as listed and agreed with the findings in the report. CT 12/14/2016 [facility] Impression: 1. Slight interval decrease in size of the left hepatic lobe cholangiocarcinoma. Slight interval decrease in size of the hypoattenuating left hepatic lobe lesion which now measures 3.6 x 2.8 cm, previously 3.9 x 2.9 cm 2. Slight interval increase in size of a porta hepatis lymph node. Other nodes are slightly smaller. 3. Multiple bilateral thyroid nodules. Recommend ultrasound for further evaluation. 4. Stable mediastinal lymph nodes.  CT chest abdomen pelvis 02/24/2017 comparing to 08/25/16 Study in [facility].  1. No acute findings within the chest, abdomen or pelvis. 2. There has been interval decrease in size of mass associated with left lobe of liver. No specific findings identified to suggest metastatic disease.Mass within the anterior and central aspect of the left lobe appears decreased in size from previous exam measuring 3.0 x 3.2 cm, 3. Bilateral percutaneous biliary drainage catheters are in place without significant biliary ductal dilatation. 4. Bilateral pleural calcifications and chronic interstitial changes of pulmonary fibrosis. Prominent mediastinal lymph nodes are nonspecific in the setting of interstitial lung disease.  CT abdomen  pelvis w  contrast 04/18/2017 comparing to 11.28/2018 study 2.8 x 2.5 cm hypoenhancing mass in segment 3 of the liver, mildly decreased. Associated atrophy of the lateral segment left hepatic lobe. Two indwelling biliary stent, unchanged. Small nodes in the porta hepatis measuring up to 11 mm short axis, stable versus mildly decreased. No evidence of new/progressive metastatic disease.  ASSESSMENT & PLAN:  Cancer Staging Cholangiocarcinoma [facility]) Staging form: Intrahepatic Bile Duct, AJCC 8th Edition - Clinical stage from 12/29/2016: Stage IV (cT1, cNX, pM1) - Signed by Earlie Server, MD on 12/29/2016  1. Cholangiocarcinoma ([place])   2. Thrombocytopenia ([place])   3. Encounter for antineoplastic chemotherapy   4. Anemia due to antineoplastic chemotherapy   5. Other fatigue   6. Systemic lupus erythematosus, unspecified SLE type, unspecified organ involvement status ([place])   # CT scan results were discussed with patient. She continue to have mild decrease of her liver lesion, having partial response.  # Will hold today's gemcitabine treatment due to generalized weakness.  # Thrombocytopenia has improved.   # Macocytic anemia likely gemcitabine side effects.. Normal B12 and folate level. Hemoglobin improving.  # Skin rash/lupus, advise patient to follow up with Rhematology. On Plaquenil and Dexamethasone 40m.      # hyperglycemia, continue monitor. On Metformin 5084mBID,  # Fatigue :continue to have fatigue, generalized weakness, despite stable disease and improved anemia.  Thyroid function was checked in November 2018 and was normal.  She has been chronically on dexamethasone [place] dexamethasone when she transition her care to me. I have weaned down to 1 mg, however her skin rash got worse. Currently she is back on 2 mg. I'm concerned that was a long-term steroids she will develop steroid myopathy. Will also check ACTH and Cortisol to rule out  adrenal insufficiency. I recommend her to make an appointment to see her rheumatologist Dr. KeJefm Bryanto see if there is any other treatment she can be placed on for the rash instead of steroids.   # I have previously refer the patient to palliative care medicine for symptom control. Patient understands that there is no cure for her disease and treatment is with palliative intent.    All questions were answered. The patient knows to call the clinic with any problems questions or concerns.  Return of visit: 1 week with Gemzar.  ZhEarlie ServerMD, PhD Hematology Oncology CoVibra Hospital Of Richmond LLCt AlAurora Med Center-Washington Countyager- [phone]/05/2017

## 2017-04-20 ENCOUNTER — Inpatient Hospital Stay: Payer: PPO

## 2017-04-20 ENCOUNTER — Encounter: Payer: Self-pay | Admitting: Oncology

## 2017-04-20 ENCOUNTER — Inpatient Hospital Stay: Payer: PPO | Attending: Oncology | Admitting: Oncology

## 2017-04-20 ENCOUNTER — Other Ambulatory Visit: Payer: Self-pay

## 2017-04-20 VITALS — BP 129/66 | HR 97 | Temp 96.4°F | Resp 18 | Wt 154.8 lb

## 2017-04-20 DIAGNOSIS — R509 Fever, unspecified: Secondary | ICD-10-CM | POA: Insufficient documentation

## 2017-04-20 DIAGNOSIS — Z9221 Personal history of antineoplastic chemotherapy: Secondary | ICD-10-CM

## 2017-04-20 DIAGNOSIS — Z8619 Personal history of other infectious and parasitic diseases: Secondary | ICD-10-CM | POA: Diagnosis not present

## 2017-04-20 DIAGNOSIS — C801 Malignant (primary) neoplasm, unspecified: Secondary | ICD-10-CM

## 2017-04-20 DIAGNOSIS — R21 Rash and other nonspecific skin eruption: Secondary | ICD-10-CM | POA: Insufficient documentation

## 2017-04-20 DIAGNOSIS — M329 Systemic lupus erythematosus, unspecified: Secondary | ICD-10-CM

## 2017-04-20 DIAGNOSIS — D72828 Other elevated white blood cell count: Secondary | ICD-10-CM | POA: Diagnosis not present

## 2017-04-20 DIAGNOSIS — R739 Hyperglycemia, unspecified: Secondary | ICD-10-CM | POA: Diagnosis not present

## 2017-04-20 DIAGNOSIS — Z8719 Personal history of other diseases of the digestive system: Secondary | ICD-10-CM | POA: Diagnosis not present

## 2017-04-20 DIAGNOSIS — Z87891 Personal history of nicotine dependence: Secondary | ICD-10-CM | POA: Diagnosis not present

## 2017-04-20 DIAGNOSIS — E1165 Type 2 diabetes mellitus with hyperglycemia: Secondary | ICD-10-CM

## 2017-04-20 DIAGNOSIS — R000 Tachycardia, unspecified: Secondary | ICD-10-CM | POA: Diagnosis not present

## 2017-04-20 DIAGNOSIS — F329 Major depressive disorder, single episode, unspecified: Secondary | ICD-10-CM

## 2017-04-20 DIAGNOSIS — D696 Thrombocytopenia, unspecified: Secondary | ICD-10-CM | POA: Insufficient documentation

## 2017-04-20 DIAGNOSIS — C221 Intrahepatic bile duct carcinoma: Secondary | ICD-10-CM | POA: Diagnosis not present

## 2017-04-20 DIAGNOSIS — R112 Nausea with vomiting, unspecified: Secondary | ICD-10-CM | POA: Diagnosis not present

## 2017-04-20 DIAGNOSIS — R0602 Shortness of breath: Secondary | ICD-10-CM | POA: Insufficient documentation

## 2017-04-20 DIAGNOSIS — Z79899 Other long term (current) drug therapy: Secondary | ICD-10-CM

## 2017-04-20 DIAGNOSIS — R5383 Other fatigue: Secondary | ICD-10-CM | POA: Insufficient documentation

## 2017-04-20 DIAGNOSIS — J449 Chronic obstructive pulmonary disease, unspecified: Secondary | ICD-10-CM

## 2017-04-20 DIAGNOSIS — R531 Weakness: Secondary | ICD-10-CM | POA: Diagnosis not present

## 2017-04-20 DIAGNOSIS — I998 Other disorder of circulatory system: Secondary | ICD-10-CM | POA: Diagnosis not present

## 2017-04-20 DIAGNOSIS — Z5111 Encounter for antineoplastic chemotherapy: Secondary | ICD-10-CM

## 2017-04-20 DIAGNOSIS — Z7984 Long term (current) use of oral hypoglycemic drugs: Secondary | ICD-10-CM | POA: Insufficient documentation

## 2017-04-20 DIAGNOSIS — I10 Essential (primary) hypertension: Secondary | ICD-10-CM | POA: Insufficient documentation

## 2017-04-20 DIAGNOSIS — K5190 Ulcerative colitis, unspecified, without complications: Secondary | ICD-10-CM

## 2017-04-20 DIAGNOSIS — M5136 Other intervertebral disc degeneration, lumbar region: Secondary | ICD-10-CM | POA: Diagnosis not present

## 2017-04-20 DIAGNOSIS — K219 Gastro-esophageal reflux disease without esophagitis: Secondary | ICD-10-CM

## 2017-04-20 DIAGNOSIS — D6481 Anemia due to antineoplastic chemotherapy: Secondary | ICD-10-CM | POA: Insufficient documentation

## 2017-04-20 DIAGNOSIS — J8410 Pulmonary fibrosis, unspecified: Secondary | ICD-10-CM

## 2017-04-20 DIAGNOSIS — E042 Nontoxic multinodular goiter: Secondary | ICD-10-CM

## 2017-04-20 DIAGNOSIS — Z9689 Presence of other specified functional implants: Secondary | ICD-10-CM | POA: Diagnosis not present

## 2017-04-20 DIAGNOSIS — R630 Anorexia: Secondary | ICD-10-CM | POA: Diagnosis not present

## 2017-04-20 DIAGNOSIS — T451X5A Adverse effect of antineoplastic and immunosuppressive drugs, initial encounter: Secondary | ICD-10-CM

## 2017-04-20 LAB — COMPREHENSIVE METABOLIC PANEL
ALBUMIN: 3 g/dL — AB (ref 3.5–5.0)
ALK PHOS: 290 U/L — AB (ref 38–126)
ALT: 71 U/L — AB (ref 14–54)
AST: 60 U/L — AB (ref 15–41)
Anion gap: 8 (ref 5–15)
BUN: 19 mg/dL (ref 6–20)
CALCIUM: 8.7 mg/dL — AB (ref 8.9–10.3)
CHLORIDE: 98 mmol/L — AB (ref 101–111)
CO2: 27 mmol/L (ref 22–32)
CREATININE: 0.7 mg/dL (ref 0.44–1.00)
GFR calc Af Amer: >60 mL/min (ref >60)
GFR calc non Af Amer: >60 mL/min (ref >60)
GLUCOSE: 199 mg/dL — AB (ref 65–99)
Potassium: 3.8 mmol/L (ref 3.5–5.1)
SODIUM: 133 mmol/L — AB (ref 135–145)
Total Bilirubin: 0.5 mg/dL (ref 0.3–1.2)
Total Protein: 6.5 g/dL (ref 6.5–8.1)

## 2017-04-20 LAB — CBC WITH DIFFERENTIAL/PLATELET
BASOS ABS: 0 10*3/uL (ref 0–0.1)
BASOS PCT: 0 %
EOS ABS: 0.1 10*3/uL (ref 0–0.7)
Eosinophils Relative: 1 %
HCT: 28.5 % — ABNORMAL LOW (ref 35.0–47.0)
HEMOGLOBIN: 9.3 g/dL — AB (ref 12.0–16.0)
Lymphocytes Relative: 6 %
Lymphs Abs: 0.4 10*3/uL — ABNORMAL LOW (ref 1.0–3.6)
MCH: 31.5 pg (ref 26.0–34.0)
MCHC: 32.6 g/dL (ref 32.0–36.0)
MCV: 96.7 fL (ref 80.0–100.0)
Monocytes Absolute: 0.6 10*3/uL (ref 0.2–0.9)
Monocytes Relative: 9 %
NEUTROS PCT: 84 %
Neutro Abs: 5.4 10*3/uL (ref 1.4–6.5)
PLATELETS: 122 10*3/uL — AB (ref 150–440)
RBC: 2.95 MIL/uL — AB (ref 3.80–5.20)
RDW: 16.3 % — ABNORMAL HIGH (ref 11.5–14.5)
WBC: 6.5 10*3/uL (ref 3.6–11.0)

## 2017-04-20 LAB — TSH: TSH: 2.073 u[IU]/mL (ref 0.350–4.500)

## 2017-04-20 MED ORDER — HEPARIN SOD (PORK) LOCK FLUSH 100 UNIT/ML IV SOLN
500.0000 [IU] | Freq: Once | INTRAVENOUS | Status: AC
  Administered 2017-04-20: 500 [IU] via INTRAVENOUS

## 2017-04-20 MED ORDER — HEPARIN SOD (PORK) LOCK FLUSH 100 UNIT/ML IV SOLN
INTRAVENOUS | Status: AC
  Filled 2017-04-20: qty 5

## 2017-04-20 NOTE — Progress Notes (Signed)
Here for follow up . Pt stating she feels like " she is in a fog " -feels sluggish,no energy,

## 2017-04-21 LAB — CANCER ANTIGEN 19-9: CAN 19-9: 55 U/mL — AB (ref 0–35)

## 2017-04-22 DIAGNOSIS — Z4659 Encounter for fitting and adjustment of other gastrointestinal appliance and device: Secondary | ICD-10-CM | POA: Diagnosis not present

## 2017-04-26 DIAGNOSIS — J449 Chronic obstructive pulmonary disease, unspecified: Secondary | ICD-10-CM | POA: Diagnosis not present

## 2017-04-26 NOTE — Progress Notes (Signed)
Hematology/Oncology Follow up note [facility] Telephone:([phone] Fax:([phone]) [phone]  Patient Care Team: Idelle Crouch, MD as PCP - General (Internal Medicine) REASON FOR VISIT Follow up for treatment of cholangiocarcinoma  PERTINENT ONCOLOGY HISTORY Regina Hood 77 y.o.  female with PMH listed as below, most significantly for cholangiocarcinoma who has been on chemotherapy treatment presents to transfer her cancer care to our cancer center.  After extensive medical record review, summary of her diagnosis and treatment history are listed below.  1. Initially presented in 07/2016 to an outside ED with lower and upper GI bleed.  A. She was found to have new LFTs elevations including AST 340, ALT 379, Alk phos 420 and t bili 0.9.   B. [place] was obtained, which did not visualize the left lobe.  C. She was evaluated by gastroenterology because of her history of UC and underwent colonoscopy, which was unremarkable per path report.  2. 08/20/16, CT noted diffuse atrophy of the left lobe with left intrahepatic biliary ductal dilatation, with 4.1 cm ill-defined low-attenuation mass. This mass was suspicious for malignancy.  3. 08/2016, MRI/MRCP which showed abnormal left hepatic lobe with diffuse biliary dilatation with a shrunken left hepatic lobe and a 2.42.9 cm oval-shaped masslike component displacing the adjacent bile ducts. There is an approximately 1.6 cm segment truncated bile ducts involving common hepatic duct and right and left hepatic although no dilatation of the right hepatic duct system was seen.   4. 09/02/16, ERCP showed localized biliary stricture in the left intrahepatic duct with dilation of the left intrahepatic branches. Sphincterotomy was performed and a stent was placed. EUS with a 3.3 cm mass and left duct dilation.  5. Repeat ERCP was done 09/10/16, during which the stent was exchanged.  6. 09/20/16, taken to OR and noted to a have an peritoneal  implant with biopsy c/w adenocarcinoma. The neoplastic cells are positive for CK7. They are negative for CK20, CDX-2, TTF-1, Napsin A, hep-par1, glypican 3, and arginase. The immunohistochemical features are consistent with adenocarcinoma of upper gastrointestinal tract or pancreaticobiliary origin.  7. 09/28/16, initially seen in medical oncology clinic by Dr. Filomena Jungling 8. 09/29/16, admitted for biliary obstruction and cholangitis, s/p PBD placement, d/c 10/06/16 9. 10/12/16, initiated on gemcitabine (400 mg/m2). CA 19-9 167 10. 10/26/16, increased gemcitabine (800 mg/m2) 11. 11/23/2016 Cycle 3 Gemzar (895m/m2), 12/07/2016, Gemzar (8045mm2) added cisplatin 2537m2  12. 12/14/2016, CT CAP shows slight interval decrease in size of L hepatic lobe cholangio. Slight interval increase in size of porta hepatis lymph node whiel other nodes are slightly smaller. Multiple bilateral thyroid nodules. Stable mediastinal lymph nodes. CA 19-9 60 14  8/30 Cycle 4 Gemzar (800m48m) added cisplatin 20mg44m 15, 12/28/2016 Gemcitabine 800mg/27mcisplatin was hold due to anemia. S/p 1 PRBC transfusion.   [facility]   [place] [facility] care to [facility] [place]    She forgot her appointment on day 8 and Gemcitabine 800mg/m67md cisplatin 20mg/m213m given  Day 9 of cycle 4 (01/05/2017).        01/19/2017  Cycle 5 Day 1Gemcitabine 800mg/m2,39mplatin 20mg/m2, 58munit blood transfusion.        01/26/2017 Cycle 5 Day 8 Gemcitabine 800mg/m2, c67matin 20mg/m2   125me has been chronically on dexamethasone since she started her treatment in [facility]. [place] she transition her care to me. I have weaned down to 1 mg,  however her skin rash got worse. Currently she is back on 2 mg. I'm concerned that was a long-term steroids she will develop steroid myopathy.  INTERVAL HISTORY Regina Hood 77 y.o.  female with above history reviewed by me today who presents for evaluation prior to  Gemcitabine chemotherapy.    Her last treatment was on 04/06/2017. During last visit, decision was made to switch to Gemcitabine every other week.  Patient reports feeling extremely  Tired. She has her percutaneous bile duct draining tubes exchanged last Friday at [facility]. She was given perioperative antibiotics orally. She told me that she had one episode of vomiting after the procedure but then no additional vomiting episodes. Extremely fatigued, poor oral intake. She had some chills, no fever. No dysuria. She has shortness of breath at baseline using home oxygen. Chest x-ray today showed similar interstitial changes at the base of her lung.her blood pressure was high in the Clinic as she forgets to take her high blood pressure medication.   Review of Systems  Constitutional: Positive for chills and fatigue. Negative for appetite change, diaphoresis, fever and unexpected weight change.  HENT:   Negative for hearing loss, lump/mass, mouth sores and nosebleeds.   Eyes: Negative for eye problems and icterus.  Respiratory: Positive for shortness of breath. Negative for chest tightness, cough and hemoptysis.        Use oxygen as needed at home.  Cardiovascular: Negative for chest pain and leg swelling.  Gastrointestinal: Negative for abdominal distention, abdominal pain, blood in stool and constipation.  Endocrine: Negative for hot flashes.  Genitourinary: Negative for difficulty urinating, dysuria and nocturia.   Musculoskeletal: Negative for arthralgias, back pain and gait problem.  Skin: Positive for rash. Negative for itching.       Skin inflammation at the suture site of percutaneous bile ducts drain.   Neurological: Negative for dizziness, gait problem and headaches.  Hematological: Negative for adenopathy. Does not bruise/bleed easily.  Psychiatric/Behavioral: Negative for confusion and depression. The patient is not nervous/anxious.    MEDICAL HISTORY:  Past Medical History:    Diagnosis Date  . Arthritis   . Cancer ([place])   . Cellulitis   . Collagen vascular disease ([place])   . COPD (chronic obstructive pulmonary disease) ([place])   . DDD (degenerative disc disease), lumbar   . Depression   . Diabetes mellitus   . Diverticulitis   . GERD (gastroesophageal reflux disease)   . GI bleed   . Headache(784.0)   . Hypertension   . Lupus   . Ulcerative colitis ([place])     SURGICAL HISTORY: Past Surgical History:  Procedure Laterality Date  . ABDOMINAL HYSTERECTOMY    . BREAST BIOPSY Right   . COLONOSCOPY    . COLONOSCOPY WITH PROPOFOL N/A 08/18/2016   Procedure: COLONOSCOPY WITH PROPOFOL;  Surgeon: Manya Silvas, MD;  Location: [facility] ENDOSCOPY;  Service: Endoscopy;  Laterality: N/A;  . ESOPHAGOGASTRODUODENOSCOPY (EGD) WITH PROPOFOL N/A 08/18/2016   Procedure: ESOPHAGOGASTRODUODENOSCOPY (EGD) WITH PROPOFOL;  Surgeon: Manya Silvas, MD;  Location: [facility] ENDOSCOPY;  Service: Endoscopy;  Laterality: N/A;  . ORIF HUMERUS FRACTURE  09/17/2011   Procedure: OPEN REDUCTION INTERNAL FIXATION (ORIF) PROXIMAL HUMERUS FRACTURE;  Surgeon: Augustin Schooling, MD;  Location: [place];  Service: Orthopedics;  Laterality: Left;    SOCIAL HISTORY: Social History   Socioeconomic History  . Marital status: Widowed    Spouse name: Not on file  . Number of children: Not on file  . Years of  education: Not on file  . Highest education level: Not on file  Social Needs  . Financial resource strain: Not on file  . Food insecurity - worry: Not on file  . Food insecurity - inability: Not on file  . Transportation needs - medical: Not on file  . Transportation needs - non-medical: Not on file  Occupational History  . Not on file  Tobacco Use  . Smoking status: Former Smoker    Packs/day: 1.00    Years: 10.00    Pack years: 10.00    Types: Cigarettes    Last attempt to quit: 12/30/1998    Years since quitting: 18.3  . Smokeless tobacco: Never Used  . Tobacco comment: quit smoking in  2003  Substance and Sexual Activity  . Alcohol use: No  . Drug use: No  . Sexual activity: Not on file  Other Topics Concern  . Not on file  Social History Narrative  . Not on file    FAMILY HISTORY: Family History  Problem Relation Age of Onset  . Breast cancer Mother 55  . COPD Father   . Prostate cancer Brother     ALLERGIES:  is allergic to feldene [piroxicam]; penicillamine; and qualaquin [quinine].  MEDICATIONS:  No current facility-administered medications for this visit.    No current outpatient medications on file.   Facility-Administered Medications Ordered in Other Visits  Medication Dose Route Frequency Provider Last Rate Last Dose  . 0.9 %  sodium chloride infusion   Intravenous PRN Earlie Server, MD   Stopped at 04/27/17 1400  . 0.9 %  sodium chloride infusion   Intravenous Continuous Vaughan Basta, MD      . heparin lock flush 100 unit/mL  500 Units Intravenous Once Earlie Server, MD      . Derrill Memo ON 04/28/2017] insulin aspart (novoLOG) injection 0-9 Units  0-9 Units Subcutaneous TID WC Vaughan Basta, MD      . piperacillin-tazobactam (ZOSYN) IVPB 3.375 g  3.375 g Intravenous Q8H Larene Beach, RPH      . vancomycin (VANCOCIN) IVPB 1000 mg/200 mL premix  1,000 mg Intravenous Once Larene Beach, [facility]      . [START ON 04/28/2017] vancomycin (VANCOCIN) IVPB 1000 mg/200 mL premix  1,000 mg Intravenous Q18H Larene Beach, RPH          .  PHYSICAL EXAMINATION: ECOG PERFORMANCE STATUS: 2 - Symptomatic, <50% confined to bed Vitals:   04/27/17 0845 04/27/17 1300  BP: (!) 175/90 (!) 177/88  Pulse: (!) 112 (!) 111  Temp: 98.4 F (36.9 C)   SpO2:  90%   Filed Weights   04/27/17 0845  Weight: 155 lb 3 oz (70.4 kg)   Physical Exam  Constitutional: She is oriented to person, place, and time and well-developed, well-nourished, and in no distress. No distress.  HENT:  Head: Normocephalic and atraumatic.  Mouth/Throat: No oropharyngeal exudate.    Eyes: EOM are normal. Pupils are equal, round, and reactive to light. Left eye exhibits no discharge. No scleral icterus.  Neck: Normal range of motion. Neck supple. No JVD present.  Cardiovascular: Normal rate and normal heart sounds.  No murmur heard. Pulmonary/Chest: Effort normal. No respiratory distress. She has no wheezes. She has no rales.  Abdominal: Soft. Bowel sounds are normal. She exhibits no distension. There is no tenderness. There is no rebound.  Musculoskeletal: Normal range of motion. She exhibits no edema or tenderness.  trace edema.  Lymphadenopathy:    She has no  cervical adenopathy.  Neurological: She is oriented to person, place, and time. She displays normal reflexes. No cranial nerve deficit.  Skin: Skin is dry. No rash noted. No pallor.  Rash has improved.   Psychiatric: Affect and judgment normal.   .    LABORATORY DATA:  I have reviewed the data as listed Lab Results  Component Value Date   WBC 6.5 04/20/2017   HGB 9.3 (L) 04/20/2017   HCT 28.5 (L) 04/20/2017   MCV 96.7 04/20/2017   PLT 122 (L) 04/20/2017   Recent Labs    04/06/17 1307 04/14/17 1016 04/20/17 1330  NA 134* 135 133*  K 3.8 3.4* 3.8  CL 97* 98* 98*  CO2 27 29 27   GLUCOSE 201* 178* 199*  BUN 20 18 19   CREATININE 0.85 0.69 0.70  CALCIUM 9.0 8.5* 8.7*  GFRNONAA >60 >60 >60  GFRAA >60 >60 >60  PROT 6.9 6.7 6.5  ALBUMIN 3.0* 3.0* 3.0*  AST 70* 99* 60*  ALT 54 117* 71*  ALKPHOS 370* 300* 290*  BILITOT 0.6 0.5 0.5   [facility] CA 19-9   09/14/16 220 --> 09/28/16 176--> 10/12/16 167-->12/07/2016 64-->12/14/2016 60. --> 12/29/2016 87--> 01/26/2017 99-->03/02/2017 53-->03/23/2017 72  RADIOGRAPHIC STUDIES: I have personally reviewed the radiological images as listed and agreed with the findings in the report. CT 12/14/2016 [facility] Impression: 1. Slight interval decrease in size of the left hepatic lobe cholangiocarcinoma. Slight interval decrease in size of the  hypoattenuating left hepatic lobe lesion which now measures 3.6 x 2.8 cm, previously 3.9 x 2.9 cm 2. Slight interval increase in size of a porta hepatis lymph node. Other nodes are slightly smaller. 3. Multiple bilateral thyroid nodules. Recommend ultrasound for further evaluation. 4. Stable mediastinal lymph nodes.  CT chest abdomen pelvis 02/24/2017 comparing to 08/25/16 Study in [facility].  1. No acute findings within the chest, abdomen or pelvis. 2. There has been interval decrease in size of mass associated with left lobe of liver. No specific findings identified to suggest metastatic disease.Mass within the anterior and central aspect of the left lobe appears decreased in size from previous exam measuring 3.0 x 3.2 cm, 3. Bilateral percutaneous biliary drainage catheters are in place without significant biliary ductal dilatation. 4. Bilateral pleural calcifications and chronic interstitial changes of pulmonary fibrosis. Prominent mediastinal lymph nodes are nonspecific in the setting of interstitial lung disease.  CT abdomen pelvis w contrast 04/18/2017 comparing to 11.28/2018 study 2.8 x 2.5 cm hypoenhancing mass in segment 3 of the liver, mildly decreased. Associated atrophy of the lateral segment left hepatic lobe. Two indwelling biliary stent, unchanged. Small nodes in the porta hepatis measuring up to 11 mm short axis, stable versus mildly decreased. No evidence of new/progressive metastatic disease.  ASSESSMENT & PLAN:  Cancer Staging Cholangiocarcinoma [facility]) Staging form: Intrahepatic Bile Duct, AJCC 8th Edition - Clinical stage from 12/29/2016: Stage IV (cT1, cNX, pM1) - Signed by Earlie Server, MD on 12/29/2016  1. Hyperbilirubinemia   2. Encounter for antineoplastic chemotherapy   3. Anemia due to antineoplastic chemotherapy   4. Cholangiocarcinoma ([place])   5. Dehydration   6. Generalized weakness   7. Hypokalemia   #Hold gemcitabine due to hyperbilirubinemia,  tachycardia,generalized weakness. # hyper bilirubinemia, etiology unclear. Obtain ultrasound liver to rule out obstruction.  Her PBDs or flushing well In the previously clamped for internal drainage. # Macocytic anemia likely gemcitabine side effects.. Normal B12 and folate level. Hemoglobin improving after chemotherapy break or due  to dehydration and hemoconcentration.. # SIRS check blood culture 2, lactic acid. Rule out sepsis. Patient's immunities compromising the minimal amount adequate leukocytosis and fever.patient was given IV fluids at a cancer center clinic. Lactate came back elevated at 2.6, as patient's immunocompromised, she was started on IV vancomycin and Zosyn at a cancer center while waiting to be admitted to the hospital. She was also given her blood pressure medication orally. # Hypokalemia: likely due to poor oral intake. She was given potassium chloride 37mq along with her hydration.   # chronic steroid use, cortisol and ACTH level pending. I discussed with patient regarding CODE STATUS. She clearly stated that she  Does not want resuscitation or intubation.  Code status; DNR/DNI.  Discussed with Dr.Vashiani and patient will be directly admitted for IV antibiotics and trending lactic acid level.   All questions were answered. The patient knows to call the clinic with any problems questions or concerns. Total face to face encounter time for this patient visit was 40 min. >50% of the time was  spent in counseling and coordination of care.  Return of visit: follow-up after discharge from hospital.  ZEarlie Server MD, PhD Hematology [place] [facility] ViewPager- [phone]/12/2017

## 2017-04-27 ENCOUNTER — Inpatient Hospital Stay (HOSPITAL_BASED_OUTPATIENT_CLINIC_OR_DEPARTMENT_OTHER): Payer: PPO | Admitting: Oncology

## 2017-04-27 ENCOUNTER — Inpatient Hospital Stay: Payer: PPO

## 2017-04-27 ENCOUNTER — Ambulatory Visit
Admission: RE | Admit: 2017-04-27 | Discharge: 2017-04-27 | Disposition: A | Discharge status: Home / Self Care | Payer: PPO | Source: Ambulatory Visit | Attending: Oncology | Admitting: Oncology

## 2017-04-27 ENCOUNTER — Other Ambulatory Visit: Payer: Self-pay

## 2017-04-27 ENCOUNTER — Inpatient Hospital Stay (HOSPITAL_BASED_OUTPATIENT_CLINIC_OR_DEPARTMENT_OTHER): Payer: PPO | Admitting: Nurse Practitioner

## 2017-04-27 ENCOUNTER — Inpatient Hospital Stay
Admission: AD | Admit: 2017-04-27 | Discharge: 2017-04-30 | DRG: 872 | Disposition: A | Discharge status: Home / Self Care | Payer: PPO | Source: Ambulatory Visit | Attending: Internal Medicine | Admitting: Internal Medicine

## 2017-04-27 ENCOUNTER — Inpatient Hospital Stay: Payer: Self-pay

## 2017-04-27 ENCOUNTER — Ambulatory Visit: Payer: PPO

## 2017-04-27 ENCOUNTER — Encounter: Payer: Self-pay | Admitting: Nurse Practitioner

## 2017-04-27 ENCOUNTER — Encounter: Payer: Self-pay | Admitting: Oncology

## 2017-04-27 DIAGNOSIS — R531 Weakness: Secondary | ICD-10-CM

## 2017-04-27 DIAGNOSIS — R112 Nausea with vomiting, unspecified: Secondary | ICD-10-CM | POA: Diagnosis not present

## 2017-04-27 DIAGNOSIS — J449 Chronic obstructive pulmonary disease, unspecified: Secondary | ICD-10-CM | POA: Diagnosis not present

## 2017-04-27 DIAGNOSIS — M5136 Other intervertebral disc degeneration, lumbar region: Secondary | ICD-10-CM | POA: Diagnosis present

## 2017-04-27 DIAGNOSIS — R7989 Other specified abnormal findings of blood chemistry: Secondary | ICD-10-CM | POA: Diagnosis not present

## 2017-04-27 DIAGNOSIS — R0602 Shortness of breath: Secondary | ICD-10-CM

## 2017-04-27 DIAGNOSIS — Z888 Allergy status to other drugs, medicaments and biological substances status: Secondary | ICD-10-CM | POA: Diagnosis not present

## 2017-04-27 DIAGNOSIS — M329 Systemic lupus erythematosus, unspecified: Secondary | ICD-10-CM | POA: Diagnosis not present

## 2017-04-27 DIAGNOSIS — I10 Essential (primary) hypertension: Secondary | ICD-10-CM | POA: Diagnosis not present

## 2017-04-27 DIAGNOSIS — Z9689 Presence of other specified functional implants: Secondary | ICD-10-CM | POA: Diagnosis not present

## 2017-04-27 DIAGNOSIS — D696 Thrombocytopenia, unspecified: Secondary | ICD-10-CM

## 2017-04-27 DIAGNOSIS — Z8042 Family history of malignant neoplasm of prostate: Secondary | ICD-10-CM | POA: Diagnosis not present

## 2017-04-27 DIAGNOSIS — Z880 Allergy status to penicillin: Secondary | ICD-10-CM | POA: Diagnosis not present

## 2017-04-27 DIAGNOSIS — E872 Acidosis, unspecified: Secondary | ICD-10-CM

## 2017-04-27 DIAGNOSIS — E119 Type 2 diabetes mellitus without complications: Secondary | ICD-10-CM | POA: Diagnosis not present

## 2017-04-27 DIAGNOSIS — Z825 Family history of asthma and other chronic lower respiratory diseases: Secondary | ICD-10-CM | POA: Diagnosis not present

## 2017-04-27 DIAGNOSIS — Z7952 Long term (current) use of systemic steroids: Secondary | ICD-10-CM | POA: Diagnosis not present

## 2017-04-27 DIAGNOSIS — F329 Major depressive disorder, single episode, unspecified: Secondary | ICD-10-CM | POA: Diagnosis not present

## 2017-04-27 DIAGNOSIS — D6481 Anemia due to antineoplastic chemotherapy: Secondary | ICD-10-CM | POA: Diagnosis not present

## 2017-04-27 DIAGNOSIS — E876 Hypokalemia: Secondary | ICD-10-CM | POA: Diagnosis not present

## 2017-04-27 DIAGNOSIS — R509 Fever, unspecified: Secondary | ICD-10-CM | POA: Diagnosis not present

## 2017-04-27 DIAGNOSIS — K219 Gastro-esophageal reflux disease without esophagitis: Secondary | ICD-10-CM | POA: Diagnosis not present

## 2017-04-27 DIAGNOSIS — Z8719 Personal history of other diseases of the digestive system: Secondary | ICD-10-CM | POA: Diagnosis not present

## 2017-04-27 DIAGNOSIS — R6520 Severe sepsis without septic shock: Secondary | ICD-10-CM | POA: Diagnosis present

## 2017-04-27 DIAGNOSIS — Z8619 Personal history of other infectious and parasitic diseases: Secondary | ICD-10-CM | POA: Diagnosis not present

## 2017-04-27 DIAGNOSIS — Z9981 Dependence on supplemental oxygen: Secondary | ICD-10-CM | POA: Diagnosis not present

## 2017-04-27 DIAGNOSIS — C249 Malignant neoplasm of biliary tract, unspecified: Secondary | ICD-10-CM | POA: Diagnosis present

## 2017-04-27 DIAGNOSIS — K5730 Diverticulosis of large intestine without perforation or abscess without bleeding: Secondary | ICD-10-CM | POA: Diagnosis present

## 2017-04-27 DIAGNOSIS — Z7984 Long term (current) use of oral hypoglycemic drugs: Secondary | ICD-10-CM

## 2017-04-27 DIAGNOSIS — Z90710 Acquired absence of both cervix and uterus: Secondary | ICD-10-CM

## 2017-04-27 DIAGNOSIS — Z86010 Personal history of colonic polyps: Secondary | ICD-10-CM

## 2017-04-27 DIAGNOSIS — T451X5A Adverse effect of antineoplastic and immunosuppressive drugs, initial encounter: Secondary | ICD-10-CM | POA: Diagnosis present

## 2017-04-27 DIAGNOSIS — Z66 Do not resuscitate: Secondary | ICD-10-CM

## 2017-04-27 DIAGNOSIS — R5383 Other fatigue: Secondary | ICD-10-CM | POA: Diagnosis not present

## 2017-04-27 DIAGNOSIS — C221 Intrahepatic bile duct carcinoma: Secondary | ICD-10-CM

## 2017-04-27 DIAGNOSIS — R739 Hyperglycemia, unspecified: Secondary | ICD-10-CM | POA: Diagnosis not present

## 2017-04-27 DIAGNOSIS — Z5111 Encounter for antineoplastic chemotherapy: Secondary | ICD-10-CM

## 2017-04-27 DIAGNOSIS — R000 Tachycardia, unspecified: Secondary | ICD-10-CM | POA: Diagnosis not present

## 2017-04-27 DIAGNOSIS — Z803 Family history of malignant neoplasm of breast: Secondary | ICD-10-CM

## 2017-04-27 DIAGNOSIS — K5190 Ulcerative colitis, unspecified, without complications: Secondary | ICD-10-CM | POA: Diagnosis not present

## 2017-04-27 DIAGNOSIS — C799 Secondary malignant neoplasm of unspecified site: Secondary | ICD-10-CM | POA: Diagnosis not present

## 2017-04-27 DIAGNOSIS — R140 Abdominal distension (gaseous): Secondary | ICD-10-CM | POA: Diagnosis not present

## 2017-04-27 DIAGNOSIS — Z87891 Personal history of nicotine dependence: Secondary | ICD-10-CM | POA: Diagnosis not present

## 2017-04-27 DIAGNOSIS — R630 Anorexia: Secondary | ICD-10-CM | POA: Diagnosis not present

## 2017-04-27 DIAGNOSIS — D869 Sarcoidosis, unspecified: Secondary | ICD-10-CM | POA: Diagnosis present

## 2017-04-27 DIAGNOSIS — R21 Rash and other nonspecific skin eruption: Secondary | ICD-10-CM | POA: Diagnosis not present

## 2017-04-27 DIAGNOSIS — E860 Dehydration: Secondary | ICD-10-CM

## 2017-04-27 DIAGNOSIS — Z9221 Personal history of antineoplastic chemotherapy: Secondary | ICD-10-CM | POA: Diagnosis not present

## 2017-04-27 DIAGNOSIS — M1990 Unspecified osteoarthritis, unspecified site: Secondary | ICD-10-CM | POA: Diagnosis not present

## 2017-04-27 DIAGNOSIS — A419 Sepsis, unspecified organism: Principal | ICD-10-CM | POA: Diagnosis present

## 2017-04-27 DIAGNOSIS — Z79899 Other long term (current) drug therapy: Secondary | ICD-10-CM | POA: Diagnosis not present

## 2017-04-27 DIAGNOSIS — D72828 Other elevated white blood cell count: Secondary | ICD-10-CM | POA: Diagnosis not present

## 2017-04-27 LAB — CBC WITH DIFFERENTIAL/PLATELET
Basophils Absolute: 0.1 10*3/uL (ref 0–0.1)
Basophils Relative: 1 %
Eosinophils Absolute: 0.1 10*3/uL (ref 0–0.7)
Eosinophils Relative: 1 %
HEMATOCRIT: 30.1 % — AB (ref 35.0–47.0)
HEMOGLOBIN: 10.1 g/dL — AB (ref 12.0–16.0)
LYMPHS ABS: 0.8 10*3/uL — AB (ref 1.0–3.6)
Lymphocytes Relative: 10 %
MCH: 31.6 pg (ref 26.0–34.0)
MCHC: 33.6 g/dL (ref 32.0–36.0)
MCV: 94.2 fL (ref 80.0–100.0)
MONOS PCT: 19 %
Monocytes Absolute: 1.6 10*3/uL — ABNORMAL HIGH (ref 0.2–0.9)
NEUTROS PCT: 69 %
Neutro Abs: 5.9 10*3/uL (ref 1.4–6.5)
Platelets: 229 10*3/uL (ref 150–440)
RBC: 3.19 MIL/uL — AB (ref 3.80–5.20)
RDW: 15.8 % — ABNORMAL HIGH (ref 11.5–14.5)
WBC: 8.6 10*3/uL (ref 3.6–11.0)

## 2017-04-27 LAB — COMPREHENSIVE METABOLIC PANEL
ALT: 52 U/L (ref 14–54)
AST: 73 U/L — AB (ref 15–41)
Albumin: 3 g/dL — ABNORMAL LOW (ref 3.5–5.0)
Alkaline Phosphatase: 301 U/L — ABNORMAL HIGH (ref 38–126)
Anion gap: 11 (ref 5–15)
BUN: 12 mg/dL (ref 6–20)
CHLORIDE: 98 mmol/L — AB (ref 101–111)
CO2: 27 mmol/L (ref 22–32)
Calcium: 8.6 mg/dL — ABNORMAL LOW (ref 8.9–10.3)
Creatinine, Ser: 0.67 mg/dL (ref 0.44–1.00)
Glucose, Bld: 107 mg/dL — ABNORMAL HIGH (ref 65–99)
POTASSIUM: 3 mmol/L — AB (ref 3.5–5.1)
SODIUM: 136 mmol/L (ref 135–145)
Total Bilirubin: 2.8 mg/dL — ABNORMAL HIGH (ref 0.3–1.2)
Total Protein: 7.1 g/dL (ref 6.5–8.1)

## 2017-04-27 LAB — LACTIC ACID, PLASMA
LACTIC ACID, VENOUS: 2.6 mmol/L — AB (ref 0.5–1.9)
LACTIC ACID, VENOUS: 2 mmol/L — AB (ref 0.5–1.9)

## 2017-04-27 LAB — CORTISOL: CORTISOL PLASMA: 13.5 ug/dL

## 2017-04-27 MED ORDER — HYDROXYCHLOROQUINE SULFATE 200 MG PO TABS
200.0000 mg | ORAL_TABLET | Freq: Two times a day (BID) | ORAL | Status: DC
  Administered 2017-04-27 – 2017-04-30 (×6): 200 mg via ORAL
  Filled 2017-04-27 (×7): qty 1

## 2017-04-27 MED ORDER — HEPARIN SODIUM (PORCINE) 5000 UNIT/ML IJ SOLN
5000.0000 [IU] | Freq: Three times a day (TID) | INTRAMUSCULAR | Status: DC
  Administered 2017-04-27 – 2017-04-30 (×9): 5000 [IU] via SUBCUTANEOUS
  Filled 2017-04-27 (×10): qty 1

## 2017-04-27 MED ORDER — AMLODIPINE BESYLATE 5 MG PO TABS
5.0000 mg | ORAL_TABLET | Freq: Every day | ORAL | Status: DC
  Administered 2017-04-28 – 2017-04-29 (×2): 5 mg via ORAL
  Filled 2017-04-27 (×2): qty 1

## 2017-04-27 MED ORDER — OXYCODONE HCL 5 MG PO TABS: 5.0000 mg | ORAL_TABLET | Freq: Four times a day (QID) | ORAL | Status: DC | PRN

## 2017-04-27 MED ORDER — MOMETASONE FURO-FORMOTEROL FUM 200-5 MCG/ACT IN AERO
2.0000 | INHALATION_SPRAY | Freq: Two times a day (BID) | RESPIRATORY_TRACT | Status: DC
  Administered 2017-04-29 – 2017-04-30 (×2): 2 via RESPIRATORY_TRACT
  Filled 2017-04-27: qty 8.8

## 2017-04-27 MED ORDER — PIPERACILLIN-TAZOBACTAM 3.375 G IVPB
3.3750 g | Freq: Three times a day (TID) | INTRAVENOUS | Status: DC
  Administered 2017-04-27 – 2017-04-30 (×9): 3.375 g via INTRAVENOUS
  Filled 2017-04-27 (×9): qty 50

## 2017-04-27 MED ORDER — VANCOMYCIN HCL IN DEXTROSE 1-5 GM/200ML-% IV SOLN
1000.0000 mg | Freq: Once | INTRAVENOUS | Status: AC
  Administered 2017-04-27: 22:00:00 1000 mg via INTRAVENOUS
  Filled 2017-04-27: qty 200

## 2017-04-27 MED ORDER — SODIUM CHLORIDE 0.9 % IJ SOLN
10.0000 mL | Freq: Two times a day (BID) | INTRAMUSCULAR | Status: DC
  Administered 2017-04-27 – 2017-04-30 (×6): 10 mL

## 2017-04-27 MED ORDER — VANCOMYCIN HCL IN DEXTROSE 1-5 GM/200ML-% IV SOLN
1000.0000 mg | INTRAVENOUS | Status: DC
  Administered 2017-04-28 – 2017-04-29 (×3): 1000 mg via INTRAVENOUS
  Filled 2017-04-27 (×4): qty 200

## 2017-04-27 MED ORDER — LORAZEPAM 0.5 MG PO TABS
0.5000 mg | ORAL_TABLET | Freq: Four times a day (QID) | ORAL | Status: DC | PRN
  Filled 2017-04-27: qty 1

## 2017-04-27 MED ORDER — SODIUM CHLORIDE 0.9 % IV SOLN
INTRAVENOUS | Status: DC
  Administered 2017-04-27 – 2017-04-30 (×4): via INTRAVENOUS

## 2017-04-27 MED ORDER — GABAPENTIN 100 MG PO CAPS
100.0000 mg | ORAL_CAPSULE | Freq: Three times a day (TID) | ORAL | Status: DC
  Administered 2017-04-27 – 2017-04-30 (×9): 100 mg via ORAL
  Filled 2017-04-27 (×9): qty 1

## 2017-04-27 MED ORDER — SODIUM CHLORIDE 0.9 % IV SOLN
INTRAVENOUS | Status: AC | PRN
  Administered 2017-04-27: 11:00:00 via INTRAVENOUS
  Filled 2017-04-27: qty 1000

## 2017-04-27 MED ORDER — AMLODIPINE BESYLATE 5 MG PO TABS
5.0000 mg | ORAL_TABLET | Freq: Once | ORAL | Status: AC
  Administered 2017-04-27: 5 mg via ORAL
  Filled 2017-04-27: qty 1

## 2017-04-27 MED ORDER — INSULIN ASPART 100 UNIT/ML ~~LOC~~ SOLN
0.0000 [IU] | Freq: Three times a day (TID) | SUBCUTANEOUS | Status: DC
  Administered 2017-04-28: 17:00:00 2 [IU] via SUBCUTANEOUS
  Administered 2017-04-29: 3 [IU] via SUBCUTANEOUS
  Administered 2017-04-29: 15:00:00 2 [IU] via SUBCUTANEOUS
  Administered 2017-04-30: 14:00:00 1 [IU] via SUBCUTANEOUS
  Filled 2017-04-27 (×4): qty 1

## 2017-04-27 MED ORDER — HEPARIN SOD (PORK) LOCK FLUSH 100 UNIT/ML IV SOLN: 500.0000 [IU] | Freq: Once | INTRAVENOUS | Status: AC

## 2017-04-27 MED ORDER — PANTOPRAZOLE SODIUM 40 MG PO TBEC
40.0000 mg | DELAYED_RELEASE_TABLET | Freq: Every day | ORAL | Status: DC
  Administered 2017-04-28 – 2017-04-30 (×3): 40 mg via ORAL
  Filled 2017-04-27 (×3): qty 1

## 2017-04-27 MED ORDER — VENLAFAXINE HCL ER 75 MG PO CP24
225.0000 mg | ORAL_CAPSULE | Freq: Every day | ORAL | Status: DC
  Administered 2017-04-27 – 2017-04-30 (×3): 225 mg via ORAL
  Filled 2017-04-27 (×4): qty 3

## 2017-04-27 MED ORDER — POTASSIUM CHLORIDE 20 MEQ/100ML IV SOLN: 20.0000 meq | Freq: Once | INTRAVENOUS | Status: DC

## 2017-04-27 MED ORDER — POLYETHYLENE GLYCOL 3350 17 G PO PACK: 1.0000 | PACK | Freq: Every day | ORAL | Status: DC | PRN

## 2017-04-27 MED ORDER — ACETAMINOPHEN 500 MG PO TABS
1000.0000 mg | ORAL_TABLET | Freq: Three times a day (TID) | ORAL | Status: DC
  Administered 2017-04-27 – 2017-04-30 (×9): 1000 mg via ORAL
  Filled 2017-04-27 (×9): qty 2

## 2017-04-27 MED ORDER — VANCOMYCIN HCL 1000 MG IV SOLR
1000.0000 mg | Freq: Once | INTRAVENOUS | Status: AC
  Administered 2017-04-27: 1000 mg via INTRAVENOUS
  Filled 2017-04-27: qty 1000

## 2017-04-27 MED ORDER — POTASSIUM CHLORIDE 2 MEQ/ML IV SOLN
Freq: Once | INTRAVENOUS | Status: AC
  Administered 2017-04-27: 11:00:00 via INTRAVENOUS
  Filled 2017-04-27: qty 100

## 2017-04-27 MED ORDER — VITAMIN B-12 1000 MCG PO TABS
1000.0000 ug | ORAL_TABLET | Freq: Every day | ORAL | Status: DC
  Administered 2017-04-28 – 2017-04-30 (×3): 1000 ug via ORAL
  Filled 2017-04-27 (×4): qty 1

## 2017-04-27 MED ORDER — ZOLPIDEM TARTRATE 5 MG PO TABS
5.0000 mg | ORAL_TABLET | Freq: Every evening | ORAL | Status: DC | PRN
  Administered 2017-04-27 – 2017-04-29 (×3): 5 mg via ORAL
  Filled 2017-04-27 (×3): qty 1

## 2017-04-27 MED ORDER — ESTRADIOL 1 MG PO TABS
1.0000 mg | ORAL_TABLET | Freq: Every day | ORAL | Status: DC
  Administered 2017-04-28 – 2017-04-30 (×3): 1 mg via ORAL
  Filled 2017-04-27 (×3): qty 1

## 2017-04-27 MED ORDER — SODIUM CHLORIDE 0.9% FLUSH
1.0000 mL | Freq: Three times a day (TID) | INTRAVENOUS | Status: DC
  Administered 2017-04-28 – 2017-04-30 (×6): 1 mL

## 2017-04-27 MED ORDER — PIPERACILLIN-TAZOBACTAM 3.375 G IVPB
3.3750 g | Freq: Once | INTRAVENOUS | Status: AC
  Administered 2017-04-27: 3.375 g via INTRAVENOUS
  Filled 2017-04-27: qty 50

## 2017-04-27 MED ORDER — DOCUSATE SODIUM 100 MG PO CAPS
100.0000 mg | ORAL_CAPSULE | Freq: Two times a day (BID) | ORAL | Status: DC | PRN
  Administered 2017-04-27: 23:00:00 100 mg via ORAL

## 2017-04-27 MED ORDER — SENNA 8.6 MG PO TABS
2.0000 | ORAL_TABLET | Freq: Every day | ORAL | Status: DC
  Administered 2017-04-28 – 2017-04-30 (×3): 17.2 mg via ORAL
  Filled 2017-04-27 (×3): qty 2

## 2017-04-27 MED ORDER — DEXAMETHASONE 4 MG PO TABS
2.0000 mg | ORAL_TABLET | Freq: Every day | ORAL | Status: DC
  Administered 2017-04-28 – 2017-04-30 (×3): 2 mg via ORAL
  Filled 2017-04-27 (×4): qty 1

## 2017-04-27 MED ORDER — SODIUM CHLORIDE 0.9 % IV SOLN
Freq: Once | INTRAVENOUS | Status: AC
  Administered 2017-04-27: 14:00:00 via INTRAVENOUS
  Filled 2017-04-27: qty 1000

## 2017-04-27 NOTE — Progress Notes (Signed)
ANTIBIOTIC CONSULT NOTE - INITIAL  Pharmacy Consult for Vancomycin and Zosyn Indication: sepsis   Allergies  Allergen Reactions  . Feldene [Piroxicam]   . Penicillamine Other (See Comments)  . Qualaquin [Quinine]     Patient Measurements: Height: 5' 2"  (157.5 cm) Weight: 158 lb 11.2 oz (72 kg) IBW/kg (Calculated) : 50.1 Adjusted Body Weight:   Vital Signs: Temp: 98.7 F (37.1 C) (01/09 1719) Temp Source: Oral (01/09 1719) BP: 159/88 (01/09 1719) Pulse Rate: 110 (01/09 1719) Intake/Output from previous day: No intake/output data recorded. Intake/Output from this shift: No intake/output data recorded.  Labs: Recent Labs    04/27/17 0810  WBC 8.6  HGB 10.1*  PLT 229  CREATININE 0.67   Estimated Creatinine Clearance: 55.6 mL/min (by C-G formula based on SCr of 0.67 mg/dL). No results for input(s): VANCOTROUGH, VANCOPEAK, VANCORANDOM, GENTTROUGH, GENTPEAK, GENTRANDOM, TOBRATROUGH, TOBRAPEAK, TOBRARND, AMIKACINPEAK, AMIKACINTROU, AMIKACIN in the last 72 hours.   Microbiology: No results found for this or any previous visit (from the past 720 hour(s)).  Medical History: Past Medical History:  Diagnosis Date  . Arthritis   . Cancer ([place])   . Cellulitis   . Collagen vascular disease ([place])   . COPD (chronic obstructive pulmonary disease) ([place])   . DDD (degenerative disc disease), lumbar   . Depression   . Diabetes mellitus   . Diverticulitis   . GERD (gastroesophageal reflux disease)   . GI bleed   . Headache(784.0)   . Hypertension   . Lupus   . Ulcerative colitis ([place])     Medications:  Medications Prior to Admission  Medication Sig Dispense Refill Last Dose  . acetaminophen (TYLENOL) 500 MG tablet Take 1,000 mg by mouth 3 (three) times daily.   Taking  . amLODipine (NORVASC) 5 MG tablet Take 5 mg by mouth daily.   Taking  . calcium carbonate 100 mg/ml SUSP Take by mouth.   Taking  . Cyanocobalamin (VITAMIN B-12 PO) Take 1,000 mcg by mouth daily.   Taking   . dexamethasone (DECADRON) 1 MG tablet Take 2 tablets (2 mg total) by mouth daily with breakfast. 60 tablet 0 Taking  . Elastic Bandages & Supports (MEDICAL COMPRESSION STOCKINGS) MISC 2 each by Does not apply route daily. 2 each 1 Taking  . estradiol (ESTRACE) 1 MG tablet Take 1 mg by mouth daily.   Taking  . Fluticasone-Salmeterol (ADVAIR DISKUS) 250-50 MCG/DOSE AEPB Inhale into the lungs.   Taking  . gabapentin (NEURONTIN) 100 MG capsule Take 100 mg by mouth 3 (three) times daily.    Taking  . hydroxychloroquine (PLAQUENIL) 200 MG tablet Take 200 mg by mouth 2 (two) times daily.    Taking  . lidocaine-prilocaine (EMLA) cream Apply 1 application topically as needed. 30 g 3 Taking  . LORazepam (ATIVAN) 0.5 MG tablet Take 0.5 mg every 6 (six) hours as needed by mouth.  0 Taking  . metFORMIN (GLUCOPHAGE) 500 MG tablet Take 500 mg by mouth 2 (two) times daily with a meal.   Taking  . ondansetron (ZOFRAN) 8 MG tablet TAKE ONE TABLET EVERY 12 HOURS AS NEEDED 30 tablet 3 Taking  . oxyCODONE (OXY IR/ROXICODONE) 5 MG immediate release tablet Take 1 tablet (5 mg total) by mouth every 6 (six) hours as needed. 120 tablet 0 Taking  . pantoprazole (PROTONIX) 40 MG tablet Take 40 mg by mouth daily.   Taking  . polyethylene glycol powder (GLYCOLAX/MIRALAX) powder Take 0.5 Containers by mouth.    Taking  . potassium  chloride SA (K-DUR,KLOR-CON) 20 MEQ tablet Take 1 tablet (20 mEq total) by mouth daily as needed (low potassium). 30 tablet 0 Taking  . prochlorperazine (COMPAZINE) 10 MG tablet Take 1 tablet (10 mg total) by mouth every 6 (six) hours as needed. 30 tablet 3 Taking  . senna (SENOKOT) 8.6 MG TABS tablet Take 2 tablets (17.2 mg total) by mouth daily. Hold for loose stools. 120 each 3 Taking  . sodium chloride 0.9 % injection Flush each biliary drains (2) with 10 mL two times daily.   Taking  . Sodium Chloride Flush (NORMAL SALINE FLUSH) 0.9 % SOLN 1 mL by Other route every 8 (eight) hours. Flush each  biliary drain with 1 mL twice daily 84 Syringe 3 Taking  . triamcinolone ointment (KENALOG) 0.1 % Apply 1 application topically 2 (two) times daily. 80 g 0 Taking  . venlafaxine XR (EFFEXOR-XR) 75 MG 24 hr capsule Take 225 mg by mouth daily.    Taking  . zolpidem (AMBIEN) 10 MG tablet Take 1 tablet (10 mg total) by mouth at bedtime as needed. 30 tablet 3 Taking   Scheduled:  . [START ON 04/28/2017] insulin aspart  0-9 Units Subcutaneous TID WC   Assessment: Pharmacy consulted to dose and monitor Vancomycin and Zosyn in this 77 year old started for sepsis.    Goal of Therapy:  Vancomycin trough level 15-20 mcg/ml  Plan:  Will give Vancomycin 1 g IV x 1 then will start Vancomycin 1 g IV q18 hours. Trough level to be drawn @ 0730 on 1/12.   Zosyn: Zosyn 3.375 g IV q8 hours    Regina Hood D 04/27/2017,5:54 PM

## 2017-04-27 NOTE — Progress Notes (Signed)
Family Meeting Note  Advance Directive:yes  Today a meeting took place with the Patient.  The following clinical team members were present during this meeting:MD  The following were discussed:Patient's diagnosis:biliar cancer, ascites , Patient's progosis: Unable to determine and Goals for treatment: DNR  Additional follow-up to be provided: Oncology  Time spent during discussion:20 minutes  Vaughan Basta, MD

## 2017-04-27 NOTE — Progress Notes (Signed)
Symptom Management Consult note [facility]  Telephone:([phone] [phone]  Patient Care Team: Idelle Crouch, MD as PCP - General (Internal Medicine)   Name of the patient: Regina Hood  [id number]  11/01/1940   Date of visit: 04/27/17  Diagnosis- cholangiocarcinoma   Chief complaint/ Reason for visit- hyperbilirubinemia  Heme/Onc history: 1. Initially presented in 07/2016 to an outside ED with lower and upper GI bleed.  A. She was found to have new LFTs elevations including AST 340, ALT 379, Alk phos 420 and t bili 0.9.   B. [place] was obtained, which did not visualize the left lobe.  C. She was evaluated by gastroenterology because of her history of UC and underwent colonoscopy, which was unremarkable per path report.  2. 08/20/16, CT noted diffuse atrophy of the left lobe with left intrahepatic biliary ductal dilatation, with 4.1 cm ill-defined low-attenuation mass. This mass was suspicious for malignancy.  3. 08/2016, MRI/MRCP which showed abnormal left hepatic lobe with diffuse biliary dilatation with a shrunken left hepatic lobe and a 2.42.9 cm oval-shaped masslike component displacing the adjacent bile ducts. There is an approximately 1.6 cm segment truncated bile ducts involving common hepatic duct and right and left hepatic although no dilatation of the right hepatic duct system was seen.   4. 09/02/16, ERCP showed localized biliary stricture in the left intrahepatic duct with dilation of the left intrahepatic branches. Sphincterotomy was performed and a stent was placed. EUS with a 3.3 cm mass and left duct dilation.  5. Repeat ERCP was done 09/10/16, during which the stent was exchanged.  6. 09/20/16, taken to OR and noted to a have an peritoneal implant with biopsy c/w adenocarcinoma. The neoplastic cells are positive for CK7. They are negative for CK20, CDX-2, TTF-1, Napsin A, hep-par1, glypican 3, and arginase. The immunohistochemical  features are consistent with adenocarcinoma of upper gastrointestinal tract or pancreaticobiliary origin.  7. 09/28/16, initially seen in medical oncology clinic by Dr. Filomena Jungling 8. 09/29/16, admitted for biliary obstruction and cholangitis, s/p PBD placement, d/c 10/06/16 9. 10/12/16, initiated on gemcitabine (400 mg/m2). CA 19-9 167 10. 10/26/16, increased gemcitabine (800 mg/m2) 11. 11/23/2016 Cycle 3 Gemzar (821m/m2), 12/07/2016, Gemzar (8016mm2) added cisplatin 2517m2  12. 12/14/2016, CT CAP shows slight interval decrease in size of L hepatic lobe cholangio. Slight interval increase in size of porta hepatis lymph node whiel other nodes are slightly smaller. Multiple bilateral thyroid nodules. Stable mediastinal lymph nodes. CA 19-9 60 14  8/30 Cycle 4 Gemzar (800m84m) added cisplatin 20mg3m 15, 12/28/2016 Gemcitabine 800mg/21mcisplatin was hold due to anemia. S/p 1 PRBC transfusion.   [facility]   Transferred her care to [facility]. [place]    She forgot her appointment on day 8 and Gemcitabine 800mg/m73md cisplatin 20mg/m264m given  Day 9 of cycle 4 (01/05/2017).        01/19/2017  Cycle 5 Day 1Gemcitabine 800mg/m2,86mplatin 20mg/m2, 69munit blood transfusion.        01/26/2017 Cycle 5 Day 8 Gemcitabine 800mg/m2, c69matin 20mg/m2 16 35mhas been on Dexamethasone 4mg daily fo50mkin rash since this [facility] by [facility], [facility] to 3mg since 9/224m018. Recently decreased to 1 mg daily due to hyperglycemia.    Interval history-patient presents to symptom management clinic today at request of Dr. Yu for furtherTasia Catchingsvaluation d/t hyperbilirubunemia and fatigue.  Dr. Yu ordered ultTasia Catchingssound, which was scheduled  for tomorrow morning, due to patient having eaten this morning.  Alternatively, chest x-ray and IV fluids were ordered.   Due to leakage, her percutaneous bile duct drain tubes were exchanged [facility] on 10/24/56 without complication.  Perioperatively she had prophylactic  antibiotics.  Patient reports one episode of emesis postprocedure.    Today, patient reports progressively worsening fatigue and poor oral intake X 1 week.  Post biliary drains this morning without complication.  Some chills.  Denies fever.  Denies shortness of breath or chest pain.  Denies dysuria.  Has not taken her blood pressure medications this morning.  Wears home oxygen but did not bring to clinic.  Rash-chronic, stable.    ECOG FS:1 - Symptomatic but completely ambulatory  Review of systems- Review of Systems  Constitutional: Positive for malaise/fatigue. Negative for chills, fever and weight loss.  HENT: Negative for congestion, nosebleeds, sinus pain, sore throat and tinnitus.   Eyes: Negative for pain and redness.  Respiratory: Positive for shortness of breath. Negative for cough, hemoptysis, sputum production and wheezing.   Cardiovascular: Negative for chest pain, palpitations and leg swelling.  Gastrointestinal: Positive for abdominal pain. Negative for constipation, diarrhea, heartburn, nausea and vomiting.  Genitourinary: Negative for dysuria.  Musculoskeletal: Negative for joint pain and myalgias.  Skin: Positive for rash. Negative for itching.  Neurological: Positive for weakness. Negative for dizziness, tingling and headaches.  Endo/Heme/Allergies: Does not bruise/bleed easily.  Psychiatric/Behavioral: Negative for depression. The patient is not nervous/anxious.      Current treatment-Gemzar only (last 04/06/17) every other week  Allergies  Allergen Reactions  . Feldene [Piroxicam]   . Penicillamine Other (See Comments)  . Qualaquin [Quinine]      Past Medical History:  Diagnosis Date  . Arthritis   . Cancer ([place])   . Cellulitis   . Collagen vascular disease ([place])   . COPD (chronic obstructive pulmonary disease) ([place])   . DDD (degenerative disc disease), lumbar   . Depression   . Diabetes mellitus   . Diverticulitis   . GERD (gastroesophageal reflux  disease)   . GI bleed   . Headache(784.0)   . Hypertension   . Lupus   . Ulcerative colitis [facility])      Past Surgical History:  Procedure Laterality Date  . ABDOMINAL HYSTERECTOMY    . BREAST BIOPSY Right   . COLONOSCOPY    . COLONOSCOPY WITH PROPOFOL N/A 08/18/2016   Procedure: COLONOSCOPY WITH PROPOFOL;  Surgeon: Manya Silvas, MD;  Location: [facility] ENDOSCOPY;  Service: Endoscopy;  Laterality: N/A;  . ESOPHAGOGASTRODUODENOSCOPY (EGD) WITH PROPOFOL N/A 08/18/2016   Procedure: ESOPHAGOGASTRODUODENOSCOPY (EGD) WITH PROPOFOL;  Surgeon: Manya Silvas, MD;  Location: [facility] ENDOSCOPY;  Service: Endoscopy;  Laterality: N/A;  . ORIF HUMERUS FRACTURE  09/17/2011   Procedure: OPEN REDUCTION INTERNAL FIXATION (ORIF) PROXIMAL HUMERUS FRACTURE;  Surgeon: Augustin Schooling, MD;  Location: [place];  Service: Orthopedics;  Laterality: Left;    Social History   Socioeconomic History  . Marital status: Widowed    Spouse name: Not on file  . Number of children: Not on file  . Years of education: Not on file  . Highest education level: Not on file  Social Needs  . Financial resource strain: Not on file  . Food insecurity - worry: Not on file  . Food insecurity - inability: Not on file  . Transportation needs - medical: Not on file  . Transportation needs - non-medical: Not on file  Occupational History  . Not on  file  Tobacco Use  . Smoking status: Former Smoker    Packs/day: 1.00    Years: 10.00    Pack years: 10.00    Types: Cigarettes    Last attempt to quit: 12/30/1998    Years since quitting: 18.3  . Smokeless tobacco: Never Used  . Tobacco comment: quit smoking in 2003  Substance and Sexual Activity  . Alcohol use: No  . Drug use: No  . Sexual activity: Not on file  Other Topics Concern  . Not on file  Social History Narrative  . Not on file    Family History  Problem Relation Age of Onset  . Breast cancer Mother 29  . COPD Father   . Prostate cancer Brother     No current  facility-administered medications for this visit.  No current outpatient medications on file.  Facility-Administered Medications Ordered in Other Visits:  .  0.9 %  sodium chloride infusion, , Intravenous, PRN, Earlie Server, MD, Stopped at 04/27/17 1400 .  0.9 %  sodium chloride infusion, , Intravenous, Continuous, Vaughan Basta, MD, Last Rate: 75 mL/hr at 04/27/17 1845 .  acetaminophen (TYLENOL) tablet 1,000 mg, 1,000 mg, Oral, TID, Vaughan Basta, MD .  amLODipine (NORVASC) tablet 5 mg, 5 mg, Oral, Daily, Vaughan Basta, MD .  Derrill Memo ON 04/28/2017] dexamethasone (DECADRON) tablet 2 mg, 2 mg, Oral, Q breakfast, Vaughan Basta, MD .  docusate sodium (COLACE) capsule 100 mg, 100 mg, Oral, BID PRN, Vaughan Basta, MD .  Derrill Memo ON 04/28/2017] estradiol (ESTRACE) tablet 1 mg, 1 mg, Oral, Daily, Vaughan Basta, MD .  gabapentin (NEURONTIN) capsule 100 mg, 100 mg, Oral, TID, Vaughan Basta, MD .  heparin injection 5,000 Units, 5,000 Units, Subcutaneous, Q8H, Vaughan Basta, MD .  heparin lock flush 100 unit/mL, 500 Units, Intravenous, Once, Earlie Server, MD .  hydroxychloroquine (PLAQUENIL) tablet 200 mg, 200 mg, Oral, BID, Vaughan Basta, MD .  Derrill Memo ON 04/28/2017] insulin aspart (novoLOG) injection 0-9 Units, 0-9 Units, Subcutaneous, TID WC, Vaughan Basta, MD .  LORazepam (ATIVAN) tablet 0.5 mg, 0.5 mg, Oral, Q6H PRN, Vaughan Basta, MD .  mometasone-formoterol (DULERA) 200-5 MCG/ACT inhaler 2 puff, 2 puff, Inhalation, BID, Vaughan Basta, MD .  oxyCODONE (Oxy IR/ROXICODONE) immediate release tablet 5 mg, 5 mg, Oral, Q6H PRN, Vaughan Basta, MD .  pantoprazole (PROTONIX) EC tablet 40 mg, 40 mg, Oral, Daily, Vaughan Basta, MD .  piperacillin-tazobactam (ZOSYN) IVPB 3.375 g, 3.375 g, Intravenous, Q8H, Larene Beach, RPH, Last Rate: 12.5 mL/hr at 04/27/17 1845, 3.375 g at 04/27/17 1845 .   polyethylene glycol (MIRALAX / GLYCOLAX) packet 17 g, 1 packet, Oral, Daily PRN, Vaughan Basta, MD .  Derrill Memo ON 04/28/2017] senna (SENOKOT) tablet 17.2 mg, 2 tablet, Oral, Daily, Vaughan Basta, MD .  sodium chloride 0.9 % injection 10 mL, 10 mL, Intracatheter, Q12H, Vaughan Basta, MD .  sodium chloride flush (NS) 0.9 % injection 1 mL, 1 mL, Other, Q8H, Vaughan Basta, MD .  vancomycin (VANCOCIN) IVPB 1000 mg/200 mL premix, 1,000 mg, Intravenous, Once, Larene Beach, RPH .  [START ON 04/28/2017] vancomycin (VANCOCIN) IVPB 1000 mg/200 mL premix, 1,000 mg, Intravenous, Q18H, Larene Beach, RPH .  [START ON 04/28/2017] venlafaxine XR (EFFEXOR-XR) 24 hr capsule 225 mg, 225 mg, Oral, Daily, Vaughan Basta, MD .  Derrill Memo ON 04/28/2017] vitamin B-12 (CYANOCOBALAMIN) tablet 1,000 mcg, 1,000 mcg, Oral, Daily, Vaughan Basta, MD .  zolpidem (AMBIEN) tablet 5 mg, 5 mg, Oral, QHS PRN, Vaughan Basta, MD  Physical exam:  177/88, HR 111, T 98.4 Oral, 90% on room air. Weight 155lb. Pain 0.  Constitutional: She is oriented to person, place, and time and well-developed, well-nourished, and in no distress. No distress.  HENT: Head: Normocephalic and atraumatic. Mouth/Throat: No oropharyngeal exudate.  Eyes: EOM are normal. Pupils are equal, round, and reactive to light. Left eye exhibits no discharge. No scleral icterus.  Neck: Normal range of motion. Neck supple. No JVD present.  Cardiovascular: Normal rate and normal heart sounds.  No murmur heard. Pulmonary/Chest: Effort normal. No respiratory distress. She has no wheezes. She has no rales.  Abdominal: Soft. Bowel sounds are normal. Rounded. Taut. RUQ, epigastric tenderness. 8 Fr percutaneous biliary drains RUQ and epigastric. No rebound.  Musculoskeletal: Normal range of motion. She exhibits no edema or tenderness. Trace edema Lymphadenopathy: She has no cervical adenopathy.  Neurological: She is  oriented to person, place, and time. She displays normal reflexes. No cranial nerve deficit.  Skin: Skin is dry. Mild rash observed on face, neck, and upper chest. No pallor.  Psychiatric: Affect and judgment normal.     CMP Latest Ref Rng & Units 04/27/2017  Glucose 65 - 99 mg/dL 107(H)  BUN 6 - 20 mg/dL 12  Creatinine 0.44 - 1.00 mg/dL 0.67  Sodium 135 - 145 mmol/L 136  Potassium 3.5 - 5.1 mmol/L 3.0(L)  Chloride 101 - 111 mmol/L 98(L)  CO2 22 - 32 mmol/L 27  Calcium 8.9 - 10.3 mg/dL 8.6(L)  Total Protein 6.5 - 8.1 g/dL 7.1  Total Bilirubin 0.3 - 1.2 mg/dL 2.8(H)  Alkaline Phos 38 - 126 U/L 301(H)  AST 15 - 41 U/L 73(H)  ALT 14 - 54 U/L 52   CBC Latest Ref Rng & Units 04/27/2017  WBC 3.6 - 11.0 K/uL 8.6  Hemoglobin 12.0 - 16.0 g/dL 10.1(L)  Hematocrit 35.0 - 47.0 % 30.1(L)  Platelets 150 - 440 K/uL 229    No images are attached to the encounter.  Dg Chest 2 View  Result Date: 04/27/2017 CLINICAL DATA:  Several years of shortness of breath with onset of weakness several months ago. History of hepatic malignancy on chemotherapy. Also history of COPD, former smoker, diabetes, and lupus. EXAM: CHEST  2 VIEW COMPARISON:  Chest x-ray and chest CT scan of March 16, 2017 FINDINGS: The lungs are adequately inflated. The interstitial markings are coarse but are not as conspicuous as on the previous study. The heart is normal in size. The pulmonary vascularity is not clearly engorged. There is no pleural effusion. The porta catheter tip projects over the midportion of the SVC. The mediastinum is normal in width. The bony thorax exhibits no acute abnormality. Drainage catheters are present in the upper abdomen. IMPRESSION: Chronic interstitial changes throughout both lungs less conspicuous than on the previous study. No definite acute pneumonia nor CHF. No findings suspicious for metastatic disease. Electronically Signed   By: David  Martinique M.D.   On: 04/27/2017 10:04   Ct Abdomen Pelvis W  Contrast  Result Date: 04/18/2017 CLINICAL DATA:  Cholangiocarcinoma diagnosed 1 year ago, on chemotherapy EXAM: CT ABDOMEN AND PELVIS WITH CONTRAST TECHNIQUE: Multidetector CT imaging of the abdomen and pelvis was performed using the standard protocol following bolus administration of intravenous contrast. CONTRAST:  123m ISOVUE-300 IOPAMIDOL (ISOVUE-300) INJECTION 61% COMPARISON:  02/24/2017 FINDINGS: Lower chest: Fibrotic changes at the lung bases, nonspecific. Calcified pleural plaques bilaterally, suggesting asbestos related pleural disease. Hepatobiliary: 2.8 x 2.5 cm ill-defined hypoenhancing mass in segment 3 (series 2/image 15), previously 3.0  x 3.2 cm. Associated atrophy of the lateral segment left hepatic lobe with focal biliary ductal dilatation and two internal external biliary drain, unchanged. Gallbladder is decompressed. Pancreas: Within normal limits. Spleen: Within normal limits. Adrenals/Urinary Tract: Adrenal glands are within normal limits. Malrotated right kidney. Kidneys are otherwise within normal limits. No hydronephrosis. Bladder is within normal limits. Stomach/Bowel: Stomach is within normal limits. No evidence of bowel obstruction. Normal appendix (series 2/ image 45). Left colonic diverticulosis, without evidence of diverticulitis. Moderate colonic stool burden, suggesting constipation. Vascular/Lymphatic: No evidence of abdominal aortic aneurysm. Retroaortic left renal vein. Atherosclerotic calcifications of the abdominal aorta and branch vessels. Small nodes in the porta hepatis measuring up to 11 mm short axis (series 2/ image 23), previously 12 mm. Reproductive: Status post hysterectomy. Right ovary is within normal limits. 19 mm left ovarian cyst (series 2/image 63), unchanged. Other: No abdominopelvic ascites. Postsurgical changes along the upper midline anterior abdominal wall (series 2/image 20), unchanged. Musculoskeletal: Degenerative changes of the visualized  thoracolumbar spine. IMPRESSION: 2.8 x 2.5 cm hypoenhancing mass in segment 3 of the liver, mildly decreased. Associated atrophy of the lateral segment left hepatic lobe. Two indwelling biliary stent, unchanged. Small nodes in the porta hepatis measuring up to 11 mm short axis, stable versus mildly decreased. No evidence of new/progressive metastatic disease. Electronically Signed   By: Julian Hy M.D.   On: 04/18/2017 10:20     Assessment and plan- Patient is a 77 y.o. female with stage IV cholangiocarcinoma, currently on biweekly Gemzar only, who presents to symptom management clinic today for complaints of worsening fatigue and hyperbilirubinemia.  Initial Evaluation:  1.  Carcinoma-stage IV-Gemzar held today due to hyperbilirubinemia (see discussion below) per Dr. Tasia Catchings.  2. Fatigue- progressively worsening. Tachycardic. Afebrile. Immunocompromised. Will check lactic and blood cultures. Start IV fluid hydration for clinical appearance and tachycardia.  3. Hyperbilirubinemia- etiology unclear. Unable to get ultrasound earlier oral intake. PBDs flushing well per patient. Recommend RUQ abdominal ultrasound while inpatient. NPO after midnight.  4.  Goals of care-patient clearly states she wishes to be DNR/DNI.  Final Evaluation: 1.  Cholangiocarcinoma-stage IV-Gemzar held today.  Followed by Dr. Tasia Catchings. RTC for re-evaluation after discharge.  2.  Elevated lactic acid-lactic acid 2.6.  IV fluid hydration initiated at rate of 76m/kg followed by maintenance, vancomycin 1 g, and Zosyn.  Blood cultures pending. WBC 8.6, ANC 5.9.  Unclear etiology.  Trend Lactics, continue fluids, and IV antibiotics. 3.  Hyperbilirubinemia-bilirubin trending up.  2.8 today.  Unclear etiology.  Abdominal ultrasound and n.p.o. after midnight while inpatient 4.  Hypertension-blood pressure persistently elevated.  Will give home Norvasc 5 mg in clinic. 5.  Hypokalemia-likely related to poor oral intake.  K-3.0.  20 mEq  replaced today.  Follow labs and replace electrolytes as needed. 6. COPD- decreased SpO2. Not wearing home O2 in clinic today. Some sob. Lungs clear. 2L via [place] in clinic. SpO2 improved. Continue home O2 in hospital. Compliance encouraged.   Recommend inpatient hospitalization for further evaluation and management.  Discussed plan of care with Dr. YTasia Catchingswho agreed.  She will reach out to hospitalist for acceptance and admission to hospitalist service.   Visit Diagnosis 1. Cholangiocarcinoma ([place]   2. Lactic acid increased   3. Hyperbilirubinemia   4. Hypertension, unspecified type   5. Abdominal distension (gaseous)   6. Hypokalemia     Patient expressed understanding and was in agreement with this plan. She also understands that She can call clinic at any time  with any questions, concerns, or complaints.   A total of (45) minutes of face-to-face time was spent with this patient with greater than 50% of that time in counseling and care-coordination.   Beckey Rutter, DNP, AGNP-C [place] at [facility] [phone] (office) 04/27/17 9:45 PM

## 2017-04-27 NOTE — H&P (Signed)
[name] [place] NAME: Regina Hood    MR#:  [id number]  DATE OF BIRTH:  [birth year]  DATE OF ADMISSION:  04/27/2017  PRIMARY CARE PHYSICIAN: Idelle Crouch, MD   REQUESTING/REFERRING PHYSICIAN: Tasia Catchings  CHIEF COMPLAINT:  No chief complaint on file.   HISTORY OF PRESENT ILLNESS: Regina Hood  is a 77 y.o. female with a known history of Cholangiocarcinoma, DM, Htn- had biliary drainage tube placed at [place] last month and due to leakage- changed last week. The tubes are internally drained and pt just need to flush it daily. Went to Cancer center for check up, abd is distending. No fever/ pain. Noted to have high lactic acid and tachycardia. So suspected sepsis by oncologist. Her WBCs not high , but she is on chemo. Given Vanc+ Zosyn IV and fluids. Sent for direct admit.  PAST MEDICAL HISTORY:   Past Medical History:  Diagnosis Date  . Arthritis   . Cancer ([place])   . Cellulitis   . Collagen vascular disease ([place])   . COPD (chronic obstructive pulmonary disease) ([place])   . DDD (degenerative disc disease), lumbar   . Depression   . Diabetes mellitus   . Diverticulitis   . GERD (gastroesophageal reflux disease)   . GI bleed   . Headache(784.0)   . Hypertension   . Lupus   . Ulcerative colitis ([place])     PAST SURGICAL HISTORY:  Past Surgical History:  Procedure Laterality Date  . ABDOMINAL HYSTERECTOMY    . BREAST BIOPSY Right   . COLONOSCOPY    . COLONOSCOPY WITH PROPOFOL N/A 08/18/2016   Procedure: COLONOSCOPY WITH PROPOFOL;  Surgeon: Manya Silvas, MD;  Location: [facility] ENDOSCOPY;  Service: Endoscopy;  Laterality: N/A;  . ESOPHAGOGASTRODUODENOSCOPY (EGD) WITH PROPOFOL N/A 08/18/2016   Procedure: ESOPHAGOGASTRODUODENOSCOPY (EGD) WITH PROPOFOL;  Surgeon: Manya Silvas, MD;  Location: [facility] ENDOSCOPY;  Service: Endoscopy;  Laterality: N/A;  . ORIF HUMERUS FRACTURE  09/17/2011   Procedure: OPEN REDUCTION INTERNAL FIXATION (ORIF) PROXIMAL HUMERUS  FRACTURE;  Surgeon: Augustin Schooling, MD;  Location: [place];  Service: Orthopedics;  Laterality: Left;    SOCIAL HISTORY:  Social History   Tobacco Use  . Smoking status: Former Smoker    Packs/day: 1.00    Years: 10.00    Pack years: 10.00    Types: Cigarettes    Last attempt to quit: 12/30/1998    Years since quitting: 18.3  . Smokeless tobacco: Never Used  . Tobacco comment: quit smoking in 2003  Substance Use Topics  . Alcohol use: No    FAMILY HISTORY:  Family History  Problem Relation Age of Onset  . Breast cancer Mother 78  . COPD Father   . Prostate cancer Brother     DRUG ALLERGIES:  Allergies  Allergen Reactions  . Feldene [Piroxicam]   . Penicillamine Other (See Comments)  . Qualaquin [Quinine]     REVIEW OF SYSTEMS:   CONSTITUTIONAL: No fever, fatigue or weakness.  EYES: No blurred or double vision.  EARS, NOSE, AND THROAT: No tinnitus or ear pain.  RESPIRATORY: No cough, shortness of breath, wheezing or hemoptysis.  CARDIOVASCULAR: No chest pain, orthopnea, edema.  GASTROINTESTINAL: No nausea, vomiting, diarrhea or abdominal pain. Have abd distension. GENITOURINARY: No dysuria, hematuria.  ENDOCRINE: No polyuria, nocturia,  HEMATOLOGY: No anemia, easy bruising or bleeding SKIN: No rash or lesion. MUSCULOSKELETAL: No joint pain or arthritis.   NEUROLOGIC: No tingling, numbness, weakness.  PSYCHIATRY:  No anxiety or depression.   MEDICATIONS AT HOME:  Prior to Admission medications   Medication Sig Start Date End Date Taking? Authorizing Provider  acetaminophen (TYLENOL) 500 MG tablet Take 1,000 mg by mouth 3 (three) times daily.    [provider]  amLODipine (NORVASC) 5 MG tablet Take 5 mg by mouth daily. 06/01/16   [provider]  calcium carbonate 100 mg/ml SUSP Take by mouth.    [provider]  Cyanocobalamin (VITAMIN B-12 PO) Take 1,000 mcg by mouth daily.    [provider]  dexamethasone (DECADRON) 1 MG  tablet Take 2 tablets (2 mg total) by mouth daily with breakfast. 03/23/17   Earlie Server, MD  Elastic Bandages & Supports ([place]) [place] 2 each by Does not apply route daily. 03/09/17   Earlie Server, MD  estradiol (ESTRACE) 1 MG tablet Take 1 mg by mouth daily. 06/16/16   [provider]  Fluticasone-Salmeterol (ADVAIR DISKUS) 250-50 MCG/DOSE AEPB Inhale into the lungs. 06/01/16 06/01/17  [provider]  gabapentin (NEURONTIN) 100 MG capsule Take 100 mg by mouth 3 (three) times daily.     [provider]  hydroxychloroquine (PLAQUENIL) 200 MG tablet Take 200 mg by mouth 2 (two) times daily.     [provider]  lidocaine-prilocaine (EMLA) cream Apply 1 application topically as needed. 02/16/17   Cammie Sickle, MD  LORazepam (ATIVAN) 0.5 MG tablet Take 0.5 mg every 6 (six) hours as needed by mouth. 01/05/17   [provider]  metFORMIN (GLUCOPHAGE) 500 MG tablet Take 500 mg by mouth 2 (two) times daily with a meal.    [provider]  ondansetron (ZOFRAN) 8 MG tablet TAKE ONE TABLET EVERY 12 HOURS AS NEEDED 04/06/17   Earlie Server, MD  oxyCODONE (OXY IR/ROXICODONE) 5 MG immediate release tablet Take 1 tablet (5 mg total) by mouth every 6 (six) hours as needed. 01/12/17   Earlie Server, MD  pantoprazole (PROTONIX) 40 MG tablet Take 40 mg by mouth daily.    [provider]  polyethylene glycol powder (GLYCOLAX/MIRALAX) powder Take 0.5 Containers by mouth.  09/25/16   [provider]  potassium chloride SA (K-DUR,KLOR-CON) 20 MEQ tablet Take 1 tablet (20 mEq total) by mouth daily as needed (low potassium). 03/09/17   Earlie Server, MD  prochlorperazine (COMPAZINE) 10 MG tablet Take 1 tablet (10 mg total) by mouth every 6 (six) hours as needed. 02/16/17   Cammie Sickle, MD  senna (SENOKOT) 8.6 MG TABS tablet Take 2 tablets (17.2 mg total) by mouth daily. Hold for loose stools. 02/16/17   Cammie Sickle, MD  sodium  chloride 0.9 % injection Flush each biliary drains (2) with 10 mL two times daily. 03/18/17   [provider]  Sodium Chloride Flush (NORMAL SALINE FLUSH) 0.9 % SOLN 1 mL by Other route every 8 (eight) hours. Flush each biliary drain with 1 mL twice daily 02/16/17   Cammie Sickle, MD  triamcinolone ointment (KENALOG) 0.1 % Apply 1 application topically 2 (two) times daily. 03/09/17   Earlie Server, MD  venlafaxine XR (EFFEXOR-XR) 75 MG 24 hr capsule Take 225 mg by mouth daily.     [provider]  zolpidem (AMBIEN) 10 MG tablet Take 1 tablet (10 mg total) by mouth at bedtime as needed. 02/16/17 04/20/17  Cammie Sickle, MD      PHYSICAL EXAMINATION:   VITAL SIGNS: Blood pressure (!) 159/88, pulse (!) 110, temperature 98.7 F (37.1  C), temperature source Oral, resp. rate 20, height 5' 2"  (1.575 m), weight 72 kg (158 lb 11.2 oz), SpO2 93 %.  GENERAL:  77 y.o.-year-old patient lying in the bed with no acute distress.  EYES: Pupils equal, round, reactive to light and accommodation. No scleral icterus. Extraocular muscles intact.  HEENT: Head atraumatic, normocephalic. Oropharynx and nasopharynx clear.  NECK:  Supple, no jugular venous distention. No thyroid enlargement, no tenderness.  LUNGS: Normal breath sounds bilaterally, no wheezing, rales,rhonchi or crepitation. No use of accessory muscles of respiration.  CARDIOVASCULAR: S1, S2 normal. No murmurs, rubs, or gallops.  ABDOMEN: Soft, nontender, distended. Bowel sounds present. No organomegaly or mass. Have 2 tubes one in RUQ and one in Epigastric. medport on right upper chest. EXTREMITIES: No pedal edema, cyanosis, or clubbing.  NEUROLOGIC: Cranial nerves II through XII are intact. Muscle strength 5/5 in all extremities. Sensation intact. Gait not checked.  PSYCHIATRIC: The patient is alert and oriented x 3.  SKIN: No obvious rash, lesion, or ulcer.   LABORATORY PANEL:   CBC Recent Labs  Lab 04/27/17 0810  WBC  8.6  HGB 10.1*  HCT 30.1*  PLT 229  MCV 94.2  MCH 31.6  MCHC 33.6  RDW 15.8*  LYMPHSABS 0.8*  MONOABS 1.6*  EOSABS 0.1  BASOSABS 0.1   ------------------------------------------------------------------------------------------------------------------  Chemistries  Recent Labs  Lab 04/27/17 0810  NA 136  K 3.0*  CL 98*  CO2 27  GLUCOSE 107*  BUN 12  CREATININE 0.67  CALCIUM 8.6*  AST 73*  ALT 52  ALKPHOS 301*  BILITOT 2.8*   ------------------------------------------------------------------------------------------------------------------ estimated creatinine clearance is 55.6 mL/min (by C-G formula based on SCr of 0.67 mg/dL). ------------------------------------------------------------------------------------------------------------------ No results for input(s): TSH, T4TOTAL, T3FREE, THYROIDAB in the last 72 hours.  Invalid input(s): FREET3   Coagulation profile No results for input(s): INR, PROTIME in the last 168 hours. ------------------------------------------------------------------------------------------------------------------- No results for input(s): DDIMER in the last 72 hours. -------------------------------------------------------------------------------------------------------------------  Cardiac Enzymes No results for input(s): CKMB, TROPONINI, MYOGLOBIN in the last 168 hours.  Invalid input(s): CK ------------------------------------------------------------------------------------------------------------------ Invalid input(s): POCBNP  ---------------------------------------------------------------------------------------------------------------  Urinalysis    Component Value Date/Time   COLORURINE YELLOW (A) 11/13/2016 0414   APPEARANCEUR CLEAR (A) 11/13/2016 0414   LABSPEC 1.006 11/13/2016 0414   PHURINE 6.0 11/13/2016 0414   GLUCOSEU NEGATIVE 11/13/2016 0414   HGBUR SMALL (A) 11/13/2016 0414   BILIRUBINUR NEGATIVE 11/13/2016 0414    KETONESUR NEGATIVE 11/13/2016 0414   PROTEINUR NEGATIVE 11/13/2016 0414   NITRITE NEGATIVE 11/13/2016 0414   LEUKOCYTESUR NEGATIVE 11/13/2016 0414     RADIOLOGY: Dg Chest 2 View  Result Date: 04/27/2017 CLINICAL DATA:  Several years of shortness of breath with onset of weakness several months ago. History of hepatic malignancy on chemotherapy. Also history of COPD, former smoker, diabetes, and lupus. EXAM: CHEST  2 VIEW COMPARISON:  Chest x-ray and chest CT scan of March 16, 2017 FINDINGS: The lungs are adequately inflated. The interstitial markings are coarse but are not as conspicuous as on the previous study. The heart is normal in size. The pulmonary vascularity is not clearly engorged. There is no pleural effusion. The porta catheter tip projects over the midportion of the SVC. The mediastinum is normal in width. The bony thorax exhibits no acute abnormality. Drainage catheters are present in the upper abdomen. IMPRESSION: Chronic interstitial changes throughout both lungs less conspicuous than on the previous study. No definite acute pneumonia nor CHF. No findings suspicious for metastatic disease. Electronically Signed  By: David  Martinique M.D.   On: 04/27/2017 10:04    EKG: Orders placed or performed during the hospital encounter of 03/16/17  . ED EKG  . ED EKG  . EKG    IMPRESSION AND PLAN:  * Sepsis   By Lactic acid, tachycardia, WBCS are not reliable in a chemo pt.   vanc + Zosyn   CT abd and RUQ sono ordered by Oncology.   Try to get paracentesis also, may have peritonitis.  * Biliary cancer   S/p drainage tube at [facility], internal drainage, just flush daily  * DM   Hold metformin with lactic acidosis   ISS   She is requesting regular diet  * Htn   Missed morning dose, so high   Cont home meds  * High bilirubin    RUQ sono and CT abd for diagnostic     Consult oncology.  All the records are reviewed and case discussed with ED provider. Management plans discussed  with the patient, family and they are in agreement.  CODE STATUS: DNR Code Status History    Date Active Date Inactive Code Status Order ID Comments User Context   03/16/2017 17:12 03/17/2017 16:08 DNR [id number]  Henreitta Leber, MD Inpatient   11/13/2016 03:01 11/14/2016 14:12 DNR [id number]  Harrie Foreman, MD Inpatient   06/29/2016 15:33 07/01/2016 19:22 DNR [id number]  Hillary Bow, MD ED   09/17/2011 14:43 09/19/2011 16:24 Full Code [id number]  Cristi Loron, RN Inpatient    Questions for Most Recent Historical Code Status (Order [id number])    Question Answer Comment   In the event of cardiac or respiratory ARREST Do not call a "code blue"    In the event of cardiac or respiratory ARREST Do not perform Intubation, CPR, defibrillation or ACLS    In the event of cardiac or respiratory ARREST Use medication by any route, position, wound care, and other measures to relive pain and suffering. May use oxygen, suction and manual treatment of airway obstruction as needed for comfort.         Advance Directive Documentation     Most Recent Value  Type of Advance Directive  Living will  Pre-existing out of facility DNR order (yellow form or pink MOST form)  No data  "MOST" Form in Place?  No data       TOTAL TIME TAKING CARE OF THIS PATIENT: 45 minutes.    Vaughan Basta M.D on 04/27/2017   Between 7am to 6pm - Pager - [phone]  After 6pm go to [email or web address] - password [email or web address] [facility]  Office  [phone]  CC: Primary care physician; Idelle Crouch, MD   Note: This dictation was prepared with Dragon dictation along with smaller phrase technology. Any transcriptional errors that result from this process are unintentional.

## 2017-04-27 NOTE — Progress Notes (Signed)
Patient here today for follow up.  Patient c/o fever blister, weakness, fatigued and decreased appetite.

## 2017-04-28 ENCOUNTER — Telehealth: Payer: Self-pay | Admitting: *Deleted

## 2017-04-28 ENCOUNTER — Ambulatory Visit: Admission: RE | Admit: 2017-04-28 | Payer: PPO | Source: Ambulatory Visit

## 2017-04-28 ENCOUNTER — Inpatient Hospital Stay: Payer: PPO

## 2017-04-28 DIAGNOSIS — E876 Hypokalemia: Secondary | ICD-10-CM

## 2017-04-28 DIAGNOSIS — C221 Intrahepatic bile duct carcinoma: Secondary | ICD-10-CM

## 2017-04-28 DIAGNOSIS — D6481 Anemia due to antineoplastic chemotherapy: Secondary | ICD-10-CM

## 2017-04-28 DIAGNOSIS — Z66 Do not resuscitate: Secondary | ICD-10-CM

## 2017-04-28 DIAGNOSIS — Z7952 Long term (current) use of systemic steroids: Secondary | ICD-10-CM

## 2017-04-28 DIAGNOSIS — E872 Acidosis: Secondary | ICD-10-CM

## 2017-04-28 DIAGNOSIS — A419 Sepsis, unspecified organism: Principal | ICD-10-CM

## 2017-04-28 LAB — BASIC METABOLIC PANEL
ANION GAP: 10 (ref 5–15)
BUN: 9 mg/dL (ref 6–20)
CO2: 26 mmol/L (ref 22–32)
Calcium: 8.1 mg/dL — ABNORMAL LOW (ref 8.9–10.3)
Chloride: 100 mmol/L — ABNORMAL LOW (ref 101–111)
Creatinine, Ser: 0.57 mg/dL (ref 0.44–1.00)
GFR calc Af Amer: >60 mL/min (ref >60)
GLUCOSE: 97 mg/dL (ref 65–99)
POTASSIUM: 3.1 mmol/L — AB (ref 3.5–5.1)
Sodium: 136 mmol/L (ref 135–145)

## 2017-04-28 LAB — CBC
HEMATOCRIT: 27.4 % — AB (ref 35.0–47.0)
HEMOGLOBIN: 9 g/dL — AB (ref 12.0–16.0)
MCH: 30.9 pg (ref 26.0–34.0)
MCHC: 32.9 g/dL (ref 32.0–36.0)
MCV: 93.8 fL (ref 80.0–100.0)
Platelets: 194 10*3/uL (ref 150–440)
RBC: 2.92 MIL/uL — ABNORMAL LOW (ref 3.80–5.20)
RDW: 16.6 % — ABNORMAL HIGH (ref 11.5–14.5)
WBC: 10.2 10*3/uL (ref 3.6–11.0)

## 2017-04-28 LAB — GLUCOSE, CAPILLARY
GLUCOSE-CAPILLARY: 109 mg/dL — AB (ref 65–99)
GLUCOSE-CAPILLARY: 86 mg/dL (ref 65–99)

## 2017-04-28 LAB — ACTH: C206 ACTH: 22.2 pg/mL (ref 7.2–63.3)

## 2017-04-28 LAB — PHOSPHORUS: PHOSPHORUS: 2.3 mg/dL — AB (ref 2.5–4.6)

## 2017-04-28 LAB — MAGNESIUM: Magnesium: 1.3 mg/dL — ABNORMAL LOW (ref 1.7–2.4)

## 2017-04-28 MED ORDER — ONDANSETRON HCL 4 MG/2ML IJ SOLN
4.0000 mg | Freq: Four times a day (QID) | INTRAMUSCULAR | Status: DC | PRN
  Administered 2017-04-28: 03:00:00 4 mg via INTRAVENOUS
  Filled 2017-04-28: qty 2

## 2017-04-28 MED ORDER — MAGNESIUM SULFATE 4 GM/100ML IV SOLN
4.0000 g | Freq: Once | INTRAVENOUS | Status: AC
  Administered 2017-04-28: 11:00:00 4 g via INTRAVENOUS
  Filled 2017-04-28: qty 100

## 2017-04-28 MED ORDER — METOPROLOL TARTRATE 25 MG PO TABS
25.0000 mg | ORAL_TABLET | Freq: Two times a day (BID) | ORAL | Status: DC
  Administered 2017-04-28 – 2017-04-30 (×5): 25 mg via ORAL
  Filled 2017-04-28 (×5): qty 1

## 2017-04-28 MED ORDER — K PHOS MONO-SOD PHOS DI & MONO 155-852-130 MG PO TABS
500.0000 mg | ORAL_TABLET | ORAL | Status: AC
  Administered 2017-04-28 (×2): 500 mg via ORAL
  Filled 2017-04-28 (×2): qty 2

## 2017-04-28 MED ORDER — POTASSIUM CHLORIDE CRYS ER 20 MEQ PO TBCR
40.0000 meq | EXTENDED_RELEASE_TABLET | Freq: Once | ORAL | Status: AC
  Administered 2017-04-28: 11:00:00 40 meq via ORAL
  Filled 2017-04-28: qty 2

## 2017-04-28 NOTE — Consult Note (Signed)
Hematology/Oncology Consult note [facility] Telephone:([phone] [phone]   Patient Care Team: Idelle Crouch, MD as PCP - General (Internal Medicine) CHIEF COMPLAINTS/PURPOSE OF CONSULTATION:  Biliary cancer  HISTORY OF PRESENTING ILLNESS:  Regina Hood is a  77 y.o.  female with PMH listed below who is currently admitted for SIRS, lactic acidosis. She has metastatic cholangiocarcinoma and has been on gemcitabine treatment. Last chemotherapy was 04/06/2017. Recent CT 2 weeks ago showed partial response. She has 2 PBD draining tube that was recently exchanged at [place]. Tubes and has been externally clamped and converted to internal draining. Outpatient labs showed elevated bilirubin. Denies any abdominal pain.  Patient presented to my clinic reported profound weakness, poor appetite, chills and fatigue.   blood culture was checked and lactic acid was sent. Lactic acid came back elevated. Patient was given IV fluids and started on broad-spectrum IV antibiotics with vancomycin and Zosyn. Patient was admitted to the hospital for additional work up and treatments.  She had US abdomen done today which did not show any obstruction.  Today she feels better, able to eat breakfast and lunch. Denies any nausea, vomiting and diarrhea.   Review of Systems  Constitutional: Negative for chills and fever.  HENT: Negative for hearing loss.   Eyes: Negative for blurred vision.  Respiratory: Negative for cough and hemoptysis.   Cardiovascular: Negative for chest pain.  Gastrointestinal: Negative for heartburn.  Genitourinary: Negative for dysuria.  Musculoskeletal: Negative for myalgias.  Skin: Negative for rash.       Chronic cutaneous lupus rash.   Neurological: Positive for weakness. Negative for dizziness and tingling.  Endo/Heme/Allergies: Does not bruise/bleed easily.  Psychiatric/Behavioral: Negative for depression.   MEDICAL HISTORY:  Past  Medical History:  Diagnosis Date  . Arthritis   . Cancer ([place])   . Cellulitis   . Collagen vascular disease ([place])   . COPD (chronic obstructive pulmonary disease) ([place])   . DDD (degenerative disc disease), lumbar   . Depression   . Diabetes mellitus   . Diverticulitis   . GERD (gastroesophageal reflux disease)   . GI bleed   . Headache(784.0)   . Hypertension   . Lupus   . Ulcerative colitis ([place])     SURGICAL HISTORY: Past Surgical History:  Procedure Laterality Date  . ABDOMINAL HYSTERECTOMY    . BREAST BIOPSY Right   . COLONOSCOPY    . COLONOSCOPY WITH PROPOFOL N/A 08/18/2016   Procedure: COLONOSCOPY WITH PROPOFOL;  Surgeon: Manya Silvas, MD;  Location: [facility] ENDOSCOPY;  Service: Endoscopy;  Laterality: N/A;  . ESOPHAGOGASTRODUODENOSCOPY (EGD) WITH PROPOFOL N/A 08/18/2016   Procedure: ESOPHAGOGASTRODUODENOSCOPY (EGD) WITH PROPOFOL;  Surgeon: Manya Silvas, MD;  Location: [facility] ENDOSCOPY;  Service: Endoscopy;  Laterality: N/A;  . ORIF HUMERUS FRACTURE  09/17/2011   Procedure: OPEN REDUCTION INTERNAL FIXATION (ORIF) PROXIMAL HUMERUS FRACTURE;  Surgeon: Augustin Schooling, MD;  Location: [place];  Service: Orthopedics;  Laterality: Left;    SOCIAL HISTORY: Social History   Socioeconomic History  . Marital status: Widowed    Spouse name: Not on file  . Number of children: Not on file  . Years of education: Not on file  . Highest education level: Not on file  Social Needs  . Financial resource strain: Not on file  . Food insecurity - worry: Not on file  . Food insecurity - inability: Not on file  . Transportation needs - medical: Not on file  . Transportation needs -  non-medical: Not on file  Occupational History  . Not on file  Tobacco Use  . Smoking status: Former Smoker    Packs/day: 1.00    Years: 10.00    Pack years: 10.00    Types: Cigarettes    Last attempt to quit: 12/30/1998    Years since quitting: 18.3  . Smokeless tobacco: Never Used  . Tobacco comment:  quit smoking in 2003  Substance and Sexual Activity  . Alcohol use: No  . Drug use: No  . Sexual activity: Not on file  Other Topics Concern  . Not on file  Social History Narrative  . Not on file    FAMILY HISTORY: Family History  Problem Relation Age of Onset  . Breast cancer Mother 57  . COPD Father   . Prostate cancer Brother     ALLERGIES:  is allergic to feldene [piroxicam]; penicillamine; and qualaquin [quinine].  MEDICATIONS:  Current Facility-Administered Medications  Medication Dose Route Frequency Provider Last Rate Last Dose  . 0.9 %  sodium chloride infusion   Intravenous Continuous Vaughan Basta, MD 75 mL/hr at 04/28/17 0258    . acetaminophen (TYLENOL) tablet 1,000 mg  1,000 mg Oral TID Vaughan Basta, MD   1,000 mg at 04/28/17 1046  . amLODipine (NORVASC) tablet 5 mg  5 mg Oral Daily Vaughan Basta, MD   5 mg at 04/28/17 1046  . dexamethasone (DECADRON) tablet 2 mg  2 mg Oral Q breakfast Vaughan Basta, MD   2 mg at 04/28/17 1046  . docusate sodium (COLACE) capsule 100 mg  100 mg Oral BID PRN Vaughan Basta, MD   100 mg at 04/27/17 2251  . estradiol (ESTRACE) tablet 1 mg  1 mg Oral Daily Vaughan Basta, MD   1 mg at 04/28/17 1046  . gabapentin (NEURONTIN) capsule 100 mg  100 mg Oral TID Vaughan Basta, MD   100 mg at 04/28/17 1046  . heparin injection 5,000 Units  5,000 Units Subcutaneous Q8H Vaughan Basta, MD   5,000 Units at 04/28/17 1350  . hydroxychloroquine (PLAQUENIL) tablet 200 mg  200 mg Oral BID Vaughan Basta, MD   200 mg at 04/28/17 1046  . insulin aspart (novoLOG) injection 0-9 Units  0-9 Units Subcutaneous TID WC Vaughan Basta, MD      . LORazepam (ATIVAN) tablet 0.5 mg  0.5 mg Oral Q6H PRN Vaughan Basta, MD      . metoprolol tartrate (LOPRESSOR) tablet 25 mg  25 mg Oral BID Max Sane, MD   25 mg at 04/28/17 1046  . mometasone-formoterol (DULERA) 200-5 MCG/ACT  inhaler 2 puff  2 puff Inhalation BID Vaughan Basta, MD      . ondansetron [facility]) injection 4 mg  4 mg Intravenous Q6H PRN Harrie Foreman, MD   4 mg at 04/28/17 0255  . oxyCODONE (Oxy IR/ROXICODONE) immediate release tablet 5 mg  5 mg Oral Q6H PRN Vaughan Basta, MD      . pantoprazole (PROTONIX) EC tablet 40 mg  40 mg Oral Daily Vaughan Basta, MD   40 mg at 04/28/17 1046  . phosphorus (K PHOS NEUTRAL) tablet 500 mg  500 mg Oral Q4H Shah, Vipul, MD      . piperacillin-tazobactam (ZOSYN) IVPB 3.375 g  3.375 g Intravenous Q8H Larene [place], [facility]   Stopped at 04/28/17 1349  . polyethylene glycol (MIRALAX / GLYCOLAX) packet 17 g  1 packet Oral Daily PRN Vaughan Basta, MD      . senna (SENOKOT) tablet 17.2  mg  2 tablet Oral Daily Vaughan Basta, MD   17.2 mg at 04/28/17 1045  . sodium chloride 0.9 % injection 10 mL  10 mL Intracatheter Q12H Vaughan Basta, MD   10 mL at 04/28/17 1047  . sodium chloride flush (NS) 0.9 % injection 1 mL  1 mL Other Q8H Vaughan Basta, MD   1 mL at 04/28/17 1350  . vancomycin (VANCOCIN) IVPB 1000 mg/200 mL premix  1,000 mg Intravenous Q18H Larene Beach, [facility]   Stopped at 04/28/17 0258  . venlafaxine XR (EFFEXOR-XR) 24 hr capsule 225 mg  225 mg Oral Daily Vaughan Basta, MD   225 mg at 04/27/17 2248  . vitamin B-12 (CYANOCOBALAMIN) tablet 1,000 mcg  1,000 mcg Oral Daily Vaughan Basta, MD   1,000 mcg at 04/28/17 1046  . zolpidem (AMBIEN) tablet 5 mg  5 mg Oral QHS PRN Vaughan Basta, MD   5 mg at 04/27/17 2154   Facility-Administered Medications Ordered in Other Encounters  Medication Dose Route Frequency Provider Last Rate Last Dose  . 0.9 %  sodium chloride infusion   Intravenous PRN Earlie Server, MD   Stopped at 04/27/17 1400  . heparin lock flush 100 unit/mL  500 Units Intravenous Once Earlie Server, MD         PHYSICAL EXAMINATION: ECOG PERFORMANCE STATUS: 2 - Symptomatic, <50%  confined to bed Vitals:   04/28/17 1000 04/28/17 1251  BP: (!) 186/86 (!) 141/64  Pulse: (!) 112 91  Resp: 18 18  Temp: 99.1 F (37.3 C) 98.8 F (37.1 C)  SpO2: 97% 95%   Filed Weights   04/27/17 1719  Weight: 158 lb 11.2 oz (72 kg)    Physical Exam  Constitutional: She is oriented to person, place, and time and well-developed, well-nourished, and in no distress. No distress.  HENT:  Head: Normocephalic and atraumatic.  Mouth/Throat: No oropharyngeal exudate.  Eyes: EOM are normal. Pupils are equal, round, and reactive to light.  pale  Neck: Normal range of motion. Neck supple.  Cardiovascular: Normal rate and regular rhythm.  No murmur heard. Pulmonary/Chest: Effort normal and breath sounds normal. She has no wheezes.  Abdominal: Soft. Bowel sounds are normal.  Mild distension.   Musculoskeletal: Normal range of motion. She exhibits no edema.  Lymphadenopathy:    She has no cervical adenopathy.  Neurological: She is alert and oriented to person, place, and time. No cranial nerve deficit.  Skin: Skin is warm and dry.  Psychiatric: Affect normal.     LABORATORY DATA:  I have reviewed the data as listed Lab Results  Component Value Date   WBC 10.2 04/28/2017   HGB 9.0 (L) 04/28/2017   HCT 27.4 (L) 04/28/2017   MCV 93.8 04/28/2017   PLT 194 04/28/2017   Recent Labs    04/14/17 1016 04/20/17 1330 04/27/17 0810 04/28/17 0638  NA 135 133* 136 136  K 3.4* 3.8 3.0* 3.1*  CL 98* 98* 98* 100*  CO2 29 27 27 26   GLUCOSE 178* 199* 107* 97  BUN 18 19 12 9   CREATININE 0.69 0.70 0.67 0.57  CALCIUM 8.5* 8.7* 8.6* 8.1*  GFRNONAA >60 >60 >60 >60  GFRAA >60 >60 >60 >60  PROT 6.7 6.5 7.1  --   ALBUMIN 3.0* 3.0* 3.0*  --   AST 99* 60* 73*  --   ALT 117* 71* 52  --   ALKPHOS 300* 290* 301*  --   BILITOT 0.5 0.5 2.8*  --  ASSESSMENT & PLAN:   # Lactic acidosis in immunocompromised patient: agree with continue IV antibiotics. Reviewed ID recommendation. Agree  with if  blood culture negative, and clinically better, can switch to oral antibiotics. CXR negative for PNA.   # Hypokalemia and hypomagnesia: agree with IV supplementation.  # Cholangiocarcinoma: achieved partial response, last chemo was in December. Follow up outpatient for additional chemotherapy.  # Hyperbilirubinemia: unknown etiology. US abdomen reviewed. No obstruction. Repeat LFT in AM.  # macrocytic anemia, due to gemcitabine treatment. Stable at baseline.  # follow up with Blood culture.  # chronic steroid use, cortisol and ACTH level normal.  Code status; DNR/DNI.   Thank you for involving me to this patient's care. Will continue follow along her inpatient course.   Earlie Server, MD, PhD Hematology Oncology [facility] Pager- [phone] 04/28/2017

## 2017-04-28 NOTE — Plan of Care (Signed)
  Progressing Education: Knowledge of General Education information will improve 04/28/2017 0358 - Progressing by Denice Bors, RN Health Behavior/Discharge Planning: Ability to manage health-related needs will improve 04/28/2017 0358 - Progressing by Denice Bors, RN Clinical Measurements: Ability to maintain clinical measurements within normal limits will improve 04/28/2017 0358 - Progressing by Denice Bors, RN Will remain free from infection 04/28/2017 0358 - Progressing by Denice Bors, RN Diagnostic test results will improve 04/28/2017 0358 - Progressing by Denice Bors, RN Respiratory complications will improve 04/28/2017 0358 - Progressing by Denice Bors, RN Cardiovascular complication will be avoided 04/28/2017 0358 - Progressing by Denice Bors, RN Activity: Risk for activity intolerance will decrease 04/28/2017 0358 - Progressing by Denice Bors, RN Nutrition: Adequate nutrition will be maintained 04/28/2017 0358 - Progressing by Denice Bors, RN Coping: Level of anxiety will decrease 04/28/2017 0358 - Progressing by Denice Bors, RN Elimination: Will not experience complications related to bowel motility 04/28/2017 0358 - Progressing by Denice Bors, RN Will not experience complications related to urinary retention 04/28/2017 0358 - Progressing by Denice Bors, RN Pain Managment: General experience of comfort will improve 04/28/2017 0358 - Progressing by Denice Bors, RN Safety: Ability to remain free from injury will improve 04/28/2017 0358 - Progressing by Denice Bors, RN Skin Integrity: Risk for impaired skin integrity will decrease 04/28/2017 0358 - Progressing by Denice Bors, RN Fluid Volume: Hemodynamic stability will improve 04/28/2017 0358 - Progressing by Denice Bors, RN Clinical Measurements: Diagnostic test results will improve 04/28/2017 0358 - Progressing by Denice Bors, RN Signs and  symptoms of infection will decrease 04/28/2017 0358 - Progressing by Denice Bors, RN

## 2017-04-28 NOTE — Progress Notes (Signed)
ID E note  Regina Hood  is a 77 y.o. female with a known history of Cholangiocarcinoma, DM, Htn- had biliary drainage tube placed at [facility] last month and due to leakage- changed 1/4 without complication. The tubes are internally drained and pt just need to flush it daily. Went to Cancer center for check up, abd is distending. No fever/ pain. Noted to have lactic acid 2.6, T bili 2.8 u p from 0.5 on 1/2  and tachycardia. CXR chronic interstitial changes. Given Vanc+ Zosyn IV and fluids and sent for direct admit.  Does have portacath but no report of issues with it. US done shows nml biliary ducts and no evidence of obstruction or cholecystitis.  No fevers since admit and HR down to 90s and BP 140/64.   Rec Await bcx Continue vanco and zosyn but if culture neg and clinically improving could dc vanco and cont just zosyn with eventual plan for dc on oral augmentin if improved and all cultures negative.  If worsens would consider CT abd.  Will see patient 1/11 but please call with questions

## 2017-04-28 NOTE — Progress Notes (Signed)
[facility] NAME: Zivah Mayr    MR#:  [id number]  DATE OF BIRTH:  [birth year]  SUBJECTIVE:  CHIEF COMPLAINT:  No chief complaint on file. abd distention and some pain REVIEW OF SYSTEMS:  Review of Systems  Constitutional: Negative for chills, fever and weight loss.  HENT: Negative for nosebleeds and sore throat.   Eyes: Negative for blurred vision.  Respiratory: Negative for cough, shortness of breath and wheezing.   Cardiovascular: Negative for chest pain, orthopnea, leg swelling and PND.  Gastrointestinal: Positive for abdominal pain. Negative for constipation, diarrhea, heartburn, nausea and vomiting.  Genitourinary: Negative for dysuria and urgency.  Musculoskeletal: Negative for back pain.  Skin: Negative for rash.  Neurological: Negative for dizziness, speech change, focal weakness and headaches.  Endo/Heme/Allergies: Does not bruise/bleed easily.  Psychiatric/Behavioral: Negative for depression.    DRUG ALLERGIES:   Allergies  Allergen Reactions  . Feldene [Piroxicam]   . Penicillamine Other (See Comments)  . Qualaquin [Quinine]    VITALS:  Blood pressure (!) 141/64, pulse 91, temperature 98.8 F (37.1 C), temperature source Oral, resp. rate 18, height 5' 2"  (1.575 m), weight 72 kg (158 lb 11.2 oz), SpO2 95 %. PHYSICAL EXAMINATION:  Physical Exam  Constitutional: She is oriented to person, place, and time and well-developed, well-nourished, and in no distress.  HENT:  Head: Normocephalic and atraumatic.  Eyes: Conjunctivae and EOM are normal. Pupils are equal, round, and reactive to light.  Neck: Normal range of motion. Neck supple. No tracheal deviation present. No thyromegaly present.  Cardiovascular: Normal rate, regular rhythm and normal heart sounds.  Pulmonary/Chest: Effort normal and breath sounds normal. No respiratory distress. She has no wheezes. She exhibits no tenderness.  Abdominal: Soft. Bowel  sounds are normal. She exhibits distension. There is no tenderness.  Musculoskeletal: Normal range of motion.  Neurological: She is alert and oriented to person, place, and time. No cranial nerve deficit.  Skin: Skin is warm and dry. No rash noted.  Psychiatric: Mood and affect normal.   LABORATORY PANEL:  Female CBC Recent Labs  Lab 04/28/17 0638  WBC 10.2  HGB 9.0*  HCT 27.4*  PLT 194   ------------------------------------------------------------------------------------------------------------------ Chemistries  Recent Labs  Lab 04/27/17 0810 04/28/17 0638  NA 136 136  K 3.0* 3.1*  CL 98* 100*  CO2 27 26  GLUCOSE 107* 97  BUN 12 9  CREATININE 0.67 0.57  CALCIUM 8.6* 8.1*  MG  --  1.3*  AST 73*  --   ALT 52  --   ALKPHOS 301*  --   BILITOT 2.8*  --    RADIOLOGY:  US Abdomen Limited Ruq  Result Date: 04/28/2017 CLINICAL DATA:  Elevated bilirubin and history of cholangiocarcinoma EXAM: ULTRASOUND ABDOMEN LIMITED RIGHT UPPER QUADRANT COMPARISON:  04/18/2017 FINDINGS: Gallbladder: Gallbladder is decompressed.  No definitive gallstones are seen. Common bile duct: Diameter: Biliary catheters are noted within the common bile duct. Accurate measurement of the common bile duct is unable to be performed. Liver: Stents are noted extending through the left and right lobes into the common bile duct. The known area of abnormal attenuation on recent CT is not well appreciated on this exam. No biliary ductal dilatation is seen. Portal vein is patent on color Doppler imaging with normal direction of blood flow towards the liver. IMPRESSION: Changes consistent with bilateral biliary drainage catheters Decompressed gallbladder. No other focal abnormality is seen. Electronically Signed   By: Inez Catalina  M.D.   On: 04/28/2017 10:04   ASSESSMENT AND PLAN:   * Sepsis   present on admission Continue vanco and zosyn but if culture neg and clinically improving could stop vanco per ID and cont  zosyn with eventual plan for dc on oral augmentin if improved and all cultures negative.   * Biliary cancer   S/p drainage tube at [facility], internal drainage, just flush daily  * HYpokalemia - replete and recheck  * DM   Hold metformin with lactic acidosis   ISS    * Htn    Cont home meds  * High bilirubin  US wnl     Consult oncology.Pending       All the records are reviewed and case discussed with Care Management/Social Worker. Management plans discussed with the patient, family and they are in agreement.  CODE STATUS: DNR  TOTAL TIME TAKING CARE OF THIS PATIENT: 64mnutes.   More than 50% of the time was spent in counseling/coordination of care: YES  POSSIBLE D/C IN 1-2 DAYS, DEPENDING ON CLINICAL CONDITION.   VMax SaneM.D on 04/28/2017 at 6:40 PM  Between 7am to 6pm - Pager - [phone]  After 6pm go to [email or web address] - pProofreader [facility]  Office  [phone] CC: Primary care physician; SIdelle Crouch MD  Note: This dictation was prepared with Dragon dictation along with smaller phrase technology. Any transcriptional errors that result from this process are unintentional.

## 2017-04-28 NOTE — Telephone Encounter (Signed)
PA started on [employer] having technical difficulty. Will need to try to resubmit.

## 2017-04-28 NOTE — Progress Notes (Signed)
Spoke with Dr Marcille Blanco during shift as pt complained of nausea.  Zofran IV ordered and given.  Pt with relief. Dorna Bloom RN

## 2017-04-28 NOTE — Telephone Encounter (Signed)
Tried to resubmit PA. Website states sent but still has question status.. Will continue to follow.

## 2017-04-29 ENCOUNTER — Inpatient Hospital Stay: Payer: PPO

## 2017-04-29 LAB — CBC
HCT: 30.3 % — ABNORMAL LOW (ref 35.0–47.0)
Hemoglobin: 9.9 g/dL — ABNORMAL LOW (ref 12.0–16.0)
MCH: 30.8 pg (ref 26.0–34.0)
MCHC: 32.6 g/dL (ref 32.0–36.0)
MCV: 94.6 fL (ref 80.0–100.0)
PLATELETS: 186 10*3/uL (ref 150–440)
RBC: 3.21 MIL/uL — AB (ref 3.80–5.20)
RDW: 16.7 % — ABNORMAL HIGH (ref 11.5–14.5)
WBC: 8.6 10*3/uL (ref 3.6–11.0)

## 2017-04-29 LAB — GLUCOSE, CAPILLARY
GLUCOSE-CAPILLARY: 146 mg/dL — AB (ref 65–99)
GLUCOSE-CAPILLARY: 170 mg/dL — AB (ref 65–99)
GLUCOSE-CAPILLARY: 255 mg/dL — AB (ref 65–99)
GLUCOSE-CAPILLARY: 98 mg/dL (ref 65–99)
Glucose-Capillary: 185 mg/dL — ABNORMAL HIGH (ref 65–99)
Glucose-Capillary: 142 mg/dL — ABNORMAL HIGH (ref 65–99)
Glucose-Capillary: 73 mg/dL (ref 65–99)
Glucose-Capillary: 210 mg/dL — ABNORMAL HIGH (ref 65–99)

## 2017-04-29 LAB — BASIC METABOLIC PANEL
Anion gap: 9 (ref 5–15)
BUN: 13 mg/dL (ref 6–20)
CO2: 28 mmol/L (ref 22–32)
CREATININE: 0.62 mg/dL (ref 0.44–1.00)
Calcium: 8.3 mg/dL — ABNORMAL LOW (ref 8.9–10.3)
Chloride: 101 mmol/L (ref 101–111)
Glucose, Bld: 87 mg/dL (ref 65–99)
Potassium: 3.4 mmol/L — ABNORMAL LOW (ref 3.5–5.1)
Sodium: 138 mmol/L (ref 135–145)

## 2017-04-29 LAB — HEPATIC FUNCTION PANEL
ALBUMIN: 2.9 g/dL — AB (ref 3.5–5.0)
ALT: 51 U/L (ref 14–54)
AST: 63 U/L — ABNORMAL HIGH (ref 15–41)
Alkaline Phosphatase: 297 U/L — ABNORMAL HIGH (ref 38–126)
BILIRUBIN INDIRECT: 1.2 mg/dL — AB (ref 0.3–0.9)
Bilirubin, Direct: 1.9 mg/dL — ABNORMAL HIGH (ref 0.1–0.5)
TOTAL PROTEIN: 6.7 g/dL (ref 6.5–8.1)
Total Bilirubin: 3.1 mg/dL — ABNORMAL HIGH (ref 0.3–1.2)

## 2017-04-29 MED ORDER — LABETALOL HCL 5 MG/ML IV SOLN
5.0000 mg | INTRAVENOUS | Status: DC | PRN
  Administered 2017-04-29: 06:00:00 5 mg via INTRAVENOUS
  Filled 2017-04-29: qty 4

## 2017-04-29 MED ORDER — IOPAMIDOL (ISOVUE-300) INJECTION 61%
100.0000 mL | Freq: Once | INTRAVENOUS | Status: AC | PRN
  Administered 2017-04-29: 14:00:00 100 mL via INTRAVENOUS

## 2017-04-29 MED ORDER — AMLODIPINE BESYLATE 5 MG PO TABS
5.0000 mg | ORAL_TABLET | Freq: Once | ORAL | Status: AC
  Administered 2017-04-29: 5 mg via ORAL
  Filled 2017-04-29: qty 1

## 2017-04-29 MED ORDER — AMLODIPINE BESYLATE 5 MG PO TABS: 5.0000 mg | ORAL_TABLET | Freq: Every day | ORAL | Status: DC

## 2017-04-29 MED ORDER — ALBUTEROL SULFATE (2.5 MG/3ML) 0.083% IN NEBU
2.5000 mg | INHALATION_SOLUTION | RESPIRATORY_TRACT | Status: DC | PRN
  Administered 2017-04-29: 2.5 mg via RESPIRATORY_TRACT
  Filled 2017-04-29: qty 3

## 2017-04-29 MED ORDER — AMLODIPINE BESYLATE 10 MG PO TABS
10.0000 mg | ORAL_TABLET | Freq: Every day | ORAL | Status: DC
  Administered 2017-04-30: 11:00:00 10 mg via ORAL
  Filled 2017-04-29: qty 1

## 2017-04-29 NOTE — Progress Notes (Addendum)
Hematology/Oncology Progress Note [facility] [phone] [phone]  Patient Care Team: Idelle Crouch, MD as PCP - General (Internal Medicine)   Name of the patient: Regina Hood  [id number]  12-May-1940  Date of visit: 04/29/17   INTERVAL HISTORY- **patient was seen and examined. She reports feeling much better today. She is going to have CT done. Bilirubin continues to trend up.     Review of systems- Review of Systems  Constitutional: Negative for chills and fever.  HENT: Negative for hearing loss.   Eyes: Negative for blurred vision and double vision.  Respiratory: Negative for cough and sputum production.   Cardiovascular: Negative for chest pain.  Gastrointestinal: Negative for heartburn.  Genitourinary: Negative for dysuria.  Musculoskeletal: Negative for myalgias.  Skin: Negative for rash.  Neurological: Negative for dizziness.  Endo/Heme/Allergies: Does not bruise/bleed easily.  Psychiatric/Behavioral: Negative for depression.    Allergies  Allergen Reactions  . Feldene [Piroxicam]   . Penicillamine Other (See Comments)  . Qualaquin [Quinine]     Patient Active Problem List   Diagnosis Date Noted  . Hyperbilirubinemia   . Hypomagnesemia   . Hypokalemia 04/27/2017  . Proteinuria 03/31/2017  . COPD exacerbation ([place]) 03/16/2017  . Dehydration 01/19/2017  . History of iron deficiency anemia 12/29/2016  . Systemic lupus erythematosus ([place]) 12/29/2016  . Cholangiocarcinoma ([place]) 12/29/2016  . History of ulcerative colitis 12/29/2016  . Goals of care, counseling/discussion 12/29/2016  . Sepsis ([place]) 11/13/2016  . At risk for sepsis 09/29/2016  . Red blood cell antibody positive, compatible PRBC difficult to obtain 09/22/2016  . Type 2 diabetes mellitus without complication, without long-term current use of insulin ([place]) 09/17/2016  . Liver mass, left lobe 08/23/2016  . Elevated liver enzymes 08/19/2016  .  Pruritic rash 08/19/2016  . IDA (iron deficiency anemia) 07/18/2016  . Cellulitis of right upper extremity 07/05/2016  . History of rectal bleeding 07/05/2016  . Rectal bleeding 06/29/2016  . Hypertension 01/07/2016  . Anxiety and depression 09/26/2013  . DDD (degenerative disc disease) 09/26/2013  . Diabetes ([place]) 09/26/2013  . Sarcoidosis 09/26/2013  . UC (ulcerative colitis) ([place]) 09/26/2013  . Proximal humerus fracture, left, closed, initial encounter 09/17/2011     Past Medical History:  Diagnosis Date  . Arthritis   . Cancer ([place])   . Cellulitis   . Collagen vascular disease ([place])   . COPD (chronic obstructive pulmonary disease) ([place])   . DDD (degenerative disc disease), lumbar   . Depression   . Diabetes mellitus   . Diverticulitis   . GERD (gastroesophageal reflux disease)   . GI bleed   . Headache(784.0)   . Hypertension   . Lupus   . Ulcerative colitis [facility])      Past Surgical History:  Procedure Laterality Date  . ABDOMINAL HYSTERECTOMY    . BREAST BIOPSY Right   . COLONOSCOPY    . COLONOSCOPY WITH PROPOFOL N/A 08/18/2016   Procedure: COLONOSCOPY WITH PROPOFOL;  Surgeon: Manya Silvas, MD;  Location[facility] ENDOSCOPY;  Service: Endoscopy;  Laterality: N/A;  . ESOPHAGOGASTRODUODENOSCOPY (EGD) WITH PROPOFOL N/A 08/18/2016   Procedure: ESOPHAGOGASTRODUODENOSCOPY (EGD) WITH PROPOFOL;  Surgeon: Manya Silvas, MD;  Location: [facility] ENDOSCOPY;  Service: Endoscopy;  Laterality: N/A;  . ORIF HUMERUS FRACTURE  09/17/2011   Procedure: OPEN REDUCTION INTERNAL FIXATION (ORIF) PROXIMAL HUMERUS FRACTURE;  Surgeon: Augustin Schooling, MD;  Location: [facility];  Service: Orthopedics;  Laterality: Left;    Social History   Socioeconomic History  .  Marital status: Widowed    Spouse name: Not on file  . Number of children: Not on file  . Years of education: Not on file  . Highest education level: Not on file  Social Needs  . Financial resource strain: Not on file  . Food  insecurity - worry: Not on file  . Food insecurity - inability: Not on file  . Transportation needs - medical: Not on file  . Transportation needs - non-medical: Not on file  Occupational History  . Not on file  Tobacco Use  . Smoking status: Former Smoker    Packs/day: 1.00    Years: 10.00    Pack years: 10.00    Types: Cigarettes    Last attempt to quit: 12/30/1998    Years since quitting: 18.3  . Smokeless tobacco: Never Used  . Tobacco comment: quit smoking in 2003  Substance and Sexual Activity  . Alcohol use: No  . Drug use: No  . Sexual activity: Not on file  Other Topics Concern  . Not on file  Social History Narrative  . Not on file     Family History  Problem Relation Age of Onset  . Breast cancer Mother 62  . COPD Father   . Prostate cancer Brother      Current Facility-Administered Medications:  .  0.9 %  sodium chloride infusion, , Intravenous, Continuous, Vaughan Basta, MD, Last Rate: 75 mL/hr at 04/29/17 0219 .  acetaminophen (TYLENOL) tablet 1,000 mg, 1,000 mg, Oral, TID, Vaughan Basta, MD, 1,000 mg at 04/29/17 0913 .  albuterol (PROVENTIL) (2.5 MG/3ML) 0.083% nebulizer solution 2.5 mg, 2.5 mg, Nebulization, Q4H PRN, Harrie Foreman, MD, 2.5 mg at 04/29/17 0942 .  [START ON 04/30/2017] amLODipine (NORVASC) tablet 10 mg, 10 mg, Oral, Daily, Manuella Ghazi, Vipul, MD .  dexamethasone (DECADRON) tablet 2 mg, 2 mg, Oral, Q breakfast, Vaughan Basta, MD, 2 mg at 04/29/17 0912 .  docusate sodium (COLACE) capsule 100 mg, 100 mg, Oral, BID PRN, Vaughan Basta, MD, 100 mg at 04/27/17 2251 .  estradiol (ESTRACE) tablet 1 mg, 1 mg, Oral, Daily, Vaughan Basta, MD, 1 mg at 04/29/17 0913 .  gabapentin (NEURONTIN) capsule 100 mg, 100 mg, Oral, TID, Vaughan Basta, MD, 100 mg at 04/29/17 0913 .  heparin injection 5,000 Units, 5,000 Units, Subcutaneous, Q8H, Vaughan Basta, MD, 5,000 Units at 04/29/17 1421 .   hydroxychloroquine (PLAQUENIL) tablet 200 mg, 200 mg, Oral, BID, Vaughan Basta, MD, 200 mg at 04/29/17 0913 .  insulin aspart (novoLOG) injection 0-9 Units, 0-9 Units, Subcutaneous, TID WC, Vaughan Basta, MD, 2 Units at 04/29/17 1457 .  labetalol (NORMODYNE,TRANDATE) injection 5-10 mg, 5-10 mg, Intravenous, Q2H PRN, Harrie Foreman, MD, 5 mg at 04/29/17 [phone]  LORazepam (ATIVAN) tablet 0.5 mg, 0.5 mg, Oral, Q6H PRN, Vaughan Basta, MD .  metoprolol tartrate (LOPRESSOR) tablet 25 mg, 25 mg, Oral, BID, Max Sane, MD, 25 mg at 04/29/17 0911 .  mometasone-formoterol (DULERA) 200-5 MCG/ACT inhaler 2 puff, 2 puff, Inhalation, BID, Vaughan Basta, MD, 2 puff at 04/29/17 0911 .  ondansetron (ZOFRAN) injection 4 mg, 4 mg, Intravenous, Q6H PRN, Harrie Foreman, MD, 4 mg at 04/28/17 0255 .  oxyCODONE (Oxy IR/ROXICODONE) immediate release tablet 5 mg, 5 mg, Oral, Q6H PRN, Vaughan Basta, MD .  pantoprazole (PROTONIX) EC tablet 40 mg, 40 mg, Oral, Daily, Vaughan Basta, MD, 40 mg at 04/29/17 0912 .  piperacillin-tazobactam (ZOSYN) IVPB 3.375 g, 3.375 g, Intravenous, Q8H, Jeneen Rinks, Teldrin D, RPH, Stopped at  04/29/17 1310 .  polyethylene glycol (MIRALAX / GLYCOLAX) packet 17 g, 1 packet, Oral, Daily PRN, Vaughan Basta, MD .  senna (SENOKOT) tablet 17.2 mg, 2 tablet, Oral, Daily, Vaughan Basta, MD, 17.2 mg at 04/29/17 0911 .  sodium chloride 0.9 % injection 10 mL, 10 mL, Intracatheter, Q12H, Vaughan Basta, MD, 10 mL at 04/28/17 2012 .  sodium chloride flush (NS) 0.9 % injection 1 mL, 1 mL, Other, Q8H, Vaughan Basta, MD, 1 mL at 04/29/17 0604 .  vancomycin (VANCOCIN) IVPB 1000 mg/200 mL premix, 1,000 mg, Intravenous, Q18H, Larene Beach, RPH, Last Rate: 200 mL/hr at 04/29/17 1430, 1,000 mg at 04/29/17 1430 .  venlafaxine XR (EFFEXOR-XR) 24 hr capsule 225 mg, 225 mg, Oral, Daily, Vaughan Basta, MD, 225 mg at  04/29/17 0911 .  vitamin B-12 (CYANOCOBALAMIN) tablet 1,000 mcg, 1,000 mcg, Oral, Daily, Vaughan Basta, MD, 1,000 mcg at 04/29/17 0911 .  zolpidem (AMBIEN) tablet 5 mg, 5 mg, Oral, QHS PRN, Vaughan Basta, MD, 5 mg at 04/28/17 2224  Facility-Administered Medications Ordered in Other Encounters:  .  0.9 %  sodium chloride infusion, , Intravenous, PRN, Earlie Server, MD, Stopped at 04/27/17 1400 .  heparin lock flush 100 unit/mL, 500 Units, Intravenous, Once, Earlie Server, MD   Physical exam:  Vitals:   04/29/17 0537 04/29/17 0901 04/29/17 1311 04/29/17 1421  BP: (!) 172/73 (!) 159/69 (!) 143/69 136/72  Pulse: (!) 103 (!) 119 89   Resp: 18 20    Temp: 98.8 F (37.1 C)  98.8 F (37.1 C)   TempSrc: Oral  Oral   SpO2: 92% 95% 100%   Weight:      Height:       Physical Exam  Constitutional: She is oriented to person, place, and time and well-developed, well-nourished, and in no distress. No distress.  HENT:  Head: Normocephalic and atraumatic.  Mouth/Throat: No oropharyngeal exudate.  Eyes: EOM are normal. Pupils are equal, round, and reactive to light.  pale  Neck: Normal range of motion. Neck supple.  Cardiovascular: Normal rate and regular rhythm.  No murmur heard. Pulmonary/Chest: Effort normal and breath sounds normal. She has no wheezes.  Abdominal: Soft. Bowel sounds are normal.  Mild distension.   Musculoskeletal: Normal range of motion. She exhibits no edema.  Lymphadenopathy:    She has no cervical adenopathy.  Neurological: She is alert and oriented to person, place, and time. No cranial nerve deficit.  Skin: Skin is warm and dry.  Psychiatric: Affect normal.     LABS   CMP Latest Ref Rng & Units 04/29/2017  Glucose 65 - 99 mg/dL 87  BUN 6 - 20 mg/dL 13  Creatinine 0.44 - 1.00 mg/dL 0.62  Sodium 135 - 145 mmol/L 138  Potassium 3.5 - 5.1 mmol/L 3.4(L)  Chloride 101 - 111 mmol/L 101  CO2 22 - 32 mmol/L 28  Calcium 8.9 - 10.3 mg/dL 8.3(L)  Total  Protein 6.5 - 8.1 g/dL 6.7  Total Bilirubin 0.3 - 1.2 mg/dL 3.1(H)  Alkaline Phos 38 - 126 U/L 297(H)  AST 15 - 41 U/L 63(H)  ALT 14 - 54 U/L 51   CBC Latest Ref Rng & Units 04/29/2017  WBC 3.6 - 11.0 K/uL 8.6  Hemoglobin 12.0 - 16.0 g/dL 9.9(L)  Hematocrit 35.0 - 47.0 % 30.3(L)  Platelets 150 - 440 K/uL 186     Dg Chest 2 View  Result Date: 04/27/2017 CLINICAL DATA:  Several years of shortness of breath with onset of weakness several  months ago. History of hepatic malignancy on chemotherapy. Also history of COPD, former smoker, diabetes, and lupus. EXAM: CHEST  2 VIEW COMPARISON:  Chest x-ray and chest CT scan of March 16, 2017 FINDINGS: The lungs are adequately inflated. The interstitial markings are coarse but are not as conspicuous as on the previous study. The heart is normal in size. The pulmonary vascularity is not clearly engorged. There is no pleural effusion. The porta catheter tip projects over the midportion of the SVC. The mediastinum is normal in width. The bony thorax exhibits no acute abnormality. Drainage catheters are present in the upper abdomen. IMPRESSION: Chronic interstitial changes throughout both lungs less conspicuous than on the previous study. No definite acute pneumonia nor CHF. No findings suspicious for metastatic disease. Electronically Signed   By: David  Martinique M.D.   On: 04/27/2017 10:04   Ct Abdomen Pelvis W Contrast  Result Date: 04/29/2017 CLINICAL DATA:  Abdominal distention. Lactic acidosis. History of metastatic cholangiocarcinoma. EXAM: CT ABDOMEN AND PELVIS WITH CONTRAST TECHNIQUE: Multidetector CT imaging of the abdomen and pelvis was performed using the standard protocol following bolus administration of intravenous contrast. CONTRAST:  126m ISOVUE-300 IOPAMIDOL (ISOVUE-300) INJECTION 61% COMPARISON:  04/18/2017 FINDINGS: Lower chest: Bilateral calcified pleural plaques. Small lymph nodes in the retrocrural space bilaterally are similar to prior.  Chronic interstitial changes are noted in the lung bases bilaterally. Hepatobiliary: Previously described hypoenhancing lesion and segment III of the liver is similar measuring 2.5 x 2.1 cm today compared to 2.8 x 2.5 cm previously. Atrophy of the lateral segment left liver again noted. No focal abnormality in the right liver. 2 internal external biliary drains are evident with distal loop formed in the duodenal lumen. Gallbladder is decompressed with gas in the lumen compatible with the presence of the biliary drains. Pancreas: No focal mass lesion. No dilatation of the main duct. No intraparenchymal cyst. No peripancreatic edema. Spleen: No splenomegaly. No focal mass lesion. Adrenals/Urinary Tract: No adrenal nodule or mass. Kidneys are unremarkable. No evidence for hydroureter. The urinary bladder appears normal for the degree of distention. Stomach/Bowel: Stomach is nondistended. No gastric wall thickening. No evidence of outlet obstruction. Duodenum is normally positioned as is the ligament of Treitz. No small bowel wall thickening. No small bowel dilatation. The terminal ileum is normal. The appendix is normal. Diverticular changes are noted in the left colon without evidence of diverticulitis. Vascular/Lymphatic: There is abdominal aortic atherosclerosis without aneurysm. Scattered small lymph nodes in the upper abdomen are stable in the interval. No pelvic sidewall lymphadenopathy. Reproductive: Uterus surgically absent. Similar appearance small follicle left ovary. Other: No intraperitoneal free fluid. Musculoskeletal: Bone windows reveal no worrisome lytic or sclerotic osseous lesions. IMPRESSION: 1. Stable exam.  No new or progressive findings. 2. Approximately 3 cm hypoenhancing lesion in the left liver is similar to prior. 3. Stable appearance of the 2 biliary drains. 4. Similar appearance retrocrural and upper abdominal small lymph nodes. 5.  Aortic Atherosclerois (ICD10-170.0) Electronically Signed    By: EMisty StanleyM.D.   On: 04/29/2017 14:45   Ct Abdomen Pelvis W Contrast  Result Date: 04/18/2017 CLINICAL DATA:  Cholangiocarcinoma diagnosed 1 year ago, on chemotherapy EXAM: CT ABDOMEN AND PELVIS WITH CONTRAST TECHNIQUE: Multidetector CT imaging of the abdomen and pelvis was performed using the standard protocol following bolus administration of intravenous contrast. CONTRAST:  1049mISOVUE-300 IOPAMIDOL (ISOVUE-300) INJECTION 61% COMPARISON:  02/24/2017 FINDINGS: Lower chest: Fibrotic changes at the lung bases, nonspecific. Calcified pleural plaques bilaterally, suggesting asbestos related  pleural disease. Hepatobiliary: 2.8 x 2.5 cm ill-defined hypoenhancing mass in segment 3 (series 2/image 15), previously 3.0 x 3.2 cm. Associated atrophy of the lateral segment left hepatic lobe with focal biliary ductal dilatation and two internal external biliary drain, unchanged. Gallbladder is decompressed. Pancreas: Within normal limits. Spleen: Within normal limits. Adrenals/Urinary Tract: Adrenal glands are within normal limits. Malrotated right kidney. Kidneys are otherwise within normal limits. No hydronephrosis. Bladder is within normal limits. Stomach/Bowel: Stomach is within normal limits. No evidence of bowel obstruction. Normal appendix (series 2/ image 45). Left colonic diverticulosis, without evidence of diverticulitis. Moderate colonic stool burden, suggesting constipation. Vascular/Lymphatic: No evidence of abdominal aortic aneurysm. Retroaortic left renal vein. Atherosclerotic calcifications of the abdominal aorta and branch vessels. Small nodes in the porta hepatis measuring up to 11 mm short axis (series 2/ image 23), previously 12 mm. Reproductive: Status post hysterectomy. Right ovary is within normal limits. 19 mm left ovarian cyst (series 2/image 63), unchanged. Other: No abdominopelvic ascites. Postsurgical changes along the upper midline anterior abdominal wall (series 2/image 20),  unchanged. Musculoskeletal: Degenerative changes of the visualized thoracolumbar spine. IMPRESSION: 2.8 x 2.5 cm hypoenhancing mass in segment 3 of the liver, mildly decreased. Associated atrophy of the lateral segment left hepatic lobe. Two indwelling biliary stent, unchanged. Small nodes in the porta hepatis measuring up to 11 mm short axis, stable versus mildly decreased. No evidence of new/progressive metastatic disease. Electronically Signed   By: Julian Hy M.D.   On: 04/18/2017 10:20   Dg Chest Port 1 View  Result Date: 04/29/2017 CLINICAL DATA:  Shortness of breath with wheezing EXAM: PORTABLE CHEST 1 VIEW COMPARISON:  April 27, 2016 FINDINGS: Port-A-Cath tip is in the superior vena cava.  No pneumothorax. Postoperative changes noted on the right. There is interstitial thickening bilaterally without frank edema or consolidation. Heart is upper normal in size with pulmonary vascular within normal limits. No adenopathy. Postoperative change in the proximal left humerus is noted. IMPRESSION: Stable interstitial thickening and postoperative change on the right. No frank edema or consolidation. Stable cardiac silhouette. No pneumothorax. Electronically Signed   By: Lowella Grip III M.D.   On: 04/29/2017 07:53   US Abdomen Limited Ruq  Result Date: 04/28/2017 CLINICAL DATA:  Elevated bilirubin and history of cholangiocarcinoma EXAM: ULTRASOUND ABDOMEN LIMITED RIGHT UPPER QUADRANT COMPARISON:  04/18/2017 FINDINGS: Gallbladder: Gallbladder is decompressed.  No definitive gallstones are seen. Common bile duct: Diameter: Biliary catheters are noted within the common bile duct. Accurate measurement of the common bile duct is unable to be performed. Liver: Stents are noted extending through the left and right lobes into the common bile duct. The known area of abnormal attenuation on recent CT is not well appreciated on this exam. No biliary ductal dilatation is seen. Portal vein is patent on color  Doppler imaging with normal direction of blood flow towards the liver. IMPRESSION: Changes consistent with bilateral biliary drainage catheters Decompressed gallbladder. No other focal abnormality is seen. Electronically Signed   By: Inez Catalina M.D.   On: 04/28/2017 10:04    Assessment and plan-   # Lactic acidosis in immunocompromised patient: Currently on IV antibiotics. Blood pressure pending. Clinically better. Reviewed ID recommendation. If continued doing well, and blood culture negativel, can switch to oral antibiotics.  # Hypokalemia and hypomagnesia: s/p supplementation, improved.  # Cholangiocarcinoma: achieved partial response, last chemo was in mid December. Follow up outpatient for additional chemotherapy.  # Hyperbilirubinemia: unknown etiology. US abdomen and CT abdomen pelvis  reviewed. There is no obstruction or progression of disease.  ? Liver failure, however the hyperbilirubinemia occurred after she had PBD tubes changed. Appreciated GI input.  Check coags.   #macrocytic anemia, due to gemcitabine treatment. Stable at baseline.  # chronic steroid use, cortisol and ACTH level normal.  Code status; DNR/DNI.   Thank you for involving me in this patient's care. We'll continue follow her inpatient course. Dr. Janese Banks is covering this weekend.  Earlie Server, MD, PhD Hematology Oncology [facility] Pager- [phone] 04/29/2017

## 2017-04-29 NOTE — Consult Note (Signed)
[place] Nurse wound consult note Reason for Consult:Cholangiocarcinoma and biliary drain in place.  Peristomal erythema noted to drain site.  No effluent or drainage noted.  Will continue with drain sponge and paper tape.  Sensitive skin.   Wound type: Moisture associated skin damage from drain effluent.  Will apply dry drain sponge daily and PRN soilage.  Pressure Injury POA: NA Measurement: Peristomal breakdown, extends 0.1 cm circumferentially from drain site.  Wound bed: intact, pink  Drainage (amount, consistency, odor) none Periwound:erythema Dressing procedure/placement/frequency: Cleanse drain site with soap and water and apply drain sponge daily and PRN soilage.  Will not follow at this time.  Please re-consult if needed.  Domenic Moras RN BSN [place] Pager [phone]

## 2017-04-29 NOTE — Progress Notes (Signed)
[facility] [place] NAME: Regina Hood    MR#:  [id number]  DATE OF BIRTH:  10-06-1940  SUBJECTIVE:  CHIEF COMPLAINT:  No chief complaint on file. no complaints, Bilirubin up REVIEW OF SYSTEMS:  Review of Systems  Constitutional: Negative for chills, fever and weight loss.  HENT: Negative for nosebleeds and sore throat.   Eyes: Negative for blurred vision.  Respiratory: Negative for cough, shortness of breath and wheezing.   Cardiovascular: Negative for chest pain, orthopnea, leg swelling and PND.  Gastrointestinal: Positive for abdominal pain. Negative for constipation, diarrhea, heartburn, nausea and vomiting.  Genitourinary: Negative for dysuria and urgency.  Musculoskeletal: Negative for back pain.  Skin: Negative for rash.  Neurological: Negative for dizziness, speech change, focal weakness and headaches.  Endo/Heme/Allergies: Does not bruise/bleed easily.  Psychiatric/Behavioral: Negative for depression.   DRUG ALLERGIES:   Allergies  Allergen Reactions  . Feldene [Piroxicam]   . Penicillamine Other (See Comments)  . Qualaquin [Quinine]    VITALS:  Blood pressure 136/72, pulse 89, temperature 98.8 F (37.1 C), temperature source Oral, resp. rate 20, height 5' 2"  (1.575 m), weight 72 kg (158 lb 11.2 oz), SpO2 100 %. PHYSICAL EXAMINATION:  Physical Exam  Constitutional: She is oriented to person, place, and time and well-developed, well-nourished, and in no distress.  HENT:  Head: Normocephalic and atraumatic.  Eyes: Conjunctivae and EOM are normal. Pupils are equal, round, and reactive to light.  Neck: Normal range of motion. Neck supple. No tracheal deviation present. No thyromegaly present.  Cardiovascular: Normal rate, regular rhythm and normal heart sounds.  Pulmonary/Chest: Effort normal and breath sounds normal. No respiratory distress. She has no wheezes. She exhibits no tenderness.  Abdominal: Soft. Bowel sounds are  normal. She exhibits distension. There is no tenderness.  Musculoskeletal: Normal range of motion.  Neurological: She is alert and oriented to person, place, and time. No cranial nerve deficit.  Skin: Skin is warm and dry. No rash noted.  Psychiatric: Mood and affect normal.   LABORATORY PANEL:  Female CBC Recent Labs  Lab 04/29/17 0320  WBC 8.6  HGB 9.9*  HCT 30.3*  PLT 186   ------------------------------------------------------------------------------------------------------------------ Chemistries  Recent Labs  Lab 04/28/17 0638 04/29/17 0320  NA 136 138  K 3.1* 3.4*  CL 100* 101  CO2 26 28  GLUCOSE 97 87  BUN 9 13  CREATININE 0.57 0.62  CALCIUM 8.1* 8.3*  MG 1.3*  --   AST  --  63*  ALT  --  51  ALKPHOS  --  297*  BILITOT  --  3.1*   RADIOLOGY:  Ct Abdomen Pelvis W Contrast  Result Date: 04/29/2017 CLINICAL DATA:  Abdominal distention. Lactic acidosis. History of metastatic cholangiocarcinoma. EXAM: CT ABDOMEN AND PELVIS WITH CONTRAST TECHNIQUE: Multidetector CT imaging of the abdomen and pelvis was performed using the standard protocol following bolus administration of intravenous contrast. CONTRAST:  150m ISOVUE-300 IOPAMIDOL (ISOVUE-300) INJECTION 61% COMPARISON:  04/18/2017 FINDINGS: Lower chest: Bilateral calcified pleural plaques. Small lymph nodes in the retrocrural space bilaterally are similar to prior. Chronic interstitial changes are noted in the lung bases bilaterally. Hepatobiliary: Previously described hypoenhancing lesion and segment III of the liver is similar measuring 2.5 x 2.1 cm today compared to 2.8 x 2.5 cm previously. Atrophy of the lateral segment left liver again noted. No focal abnormality in the right liver. 2 internal external biliary drains are evident with distal loop formed in the duodenal lumen.  Gallbladder is decompressed with gas in the lumen compatible with the presence of the biliary drains. Pancreas: No focal mass lesion. No dilatation  of the main duct. No intraparenchymal cyst. No peripancreatic edema. Spleen: No splenomegaly. No focal mass lesion. Adrenals/Urinary Tract: No adrenal nodule or mass. Kidneys are unremarkable. No evidence for hydroureter. The urinary bladder appears normal for the degree of distention. Stomach/Bowel: Stomach is nondistended. No gastric wall thickening. No evidence of outlet obstruction. Duodenum is normally positioned as is the ligament of Treitz. No small bowel wall thickening. No small bowel dilatation. The terminal ileum is normal. The appendix is normal. Diverticular changes are noted in the left colon without evidence of diverticulitis. Vascular/Lymphatic: There is abdominal aortic atherosclerosis without aneurysm. Scattered small lymph nodes in the upper abdomen are stable in the interval. No pelvic sidewall lymphadenopathy. Reproductive: Uterus surgically absent. Similar appearance small follicle left ovary. Other: No intraperitoneal free fluid. Musculoskeletal: Bone windows reveal no worrisome lytic or sclerotic osseous lesions. IMPRESSION: 1. Stable exam.  No new or progressive findings. 2. Approximately 3 cm hypoenhancing lesion in the left liver is similar to prior. 3. Stable appearance of the 2 biliary drains. 4. Similar appearance retrocrural and upper abdominal small lymph nodes. 5.  Aortic Atherosclerois (ICD10-170.0) Electronically Signed   By: Misty Stanley M.D.   On: 04/29/2017 14:45   Dg Chest Port 1 View  Result Date: 04/29/2017 CLINICAL DATA:  Shortness of breath with wheezing EXAM: PORTABLE CHEST 1 VIEW COMPARISON:  April 27, 2016 FINDINGS: Port-A-Cath tip is in the superior vena cava.  No pneumothorax. Postoperative changes noted on the right. There is interstitial thickening bilaterally without frank edema or consolidation. Heart is upper normal in size with pulmonary vascular within normal limits. No adenopathy. Postoperative change in the proximal left humerus is noted. IMPRESSION:  Stable interstitial thickening and postoperative change on the right. No frank edema or consolidation. Stable cardiac silhouette. No pneumothorax. Electronically Signed   By: Lowella Grip III M.D.   On: 04/29/2017 07:53   ASSESSMENT AND PLAN:  * Sepsis   present on admission Continue vanco and zosyn but if culture neg and clinically improving could stop vanco per ID  - can dc on oral augmentin if improved and all cultures remain negative.   * Biliary cancer   S/p drainage tube at [facility], internal drainage, just flush daily  * Hypokalemia - replete and recheck  * DM   Hold metformin with lactic acidosis   ISS    * HTN    Cont home meds  * High bilirubin: (Lab Abnormality?) - unknown etiology. US abdomen and CT abdomen pelvis NEG. There is no obstruction or progression of disease.  - Biliary tubes draining ok - requested GI c/s per [facility] request and d/w Dr Marius Ditch       All the records are reviewed and case discussed with Care Management/Social Worker. Management plans discussed with the patient, [facility], GI and they are in agreement.  CODE STATUS: DNR  TOTAL TIME TAKING CARE OF THIS PATIENT: 33mnutes.   More than 50% of the time was spent in counseling/coordination of care: YES  POSSIBLE D/C IN 1-2 DAYS, DEPENDING ON CLINICAL CONDITION. And GI, [facility] eval   VMax SaneM.D on 04/29/2017 at 6:22 PM  Between 7am to 6pm - Pager - [phone]  After 6pm go to [email or web address] - pProofreader [facility]  Office  [phone] CC: Primary care physician; SIdelle Crouch MD  Note:  This dictation was prepared with Dragon dictation along with smaller phrase technology. Any transcriptional errors that result from this process are unintentional.

## 2017-04-29 NOTE — Progress Notes (Signed)
Pharmacy Antibiotic Note  Regina Hood is a 77 y.o. female admitted on 04/27/2017 with sepsis.  Pharmacy has been consulted for vancomycin + piperacillin/tazobactam dosing.  This is day #3 of antibiotics. ID following.  Plan: Continue piperacillin/tazobactam 3.375 g IV q8h EI  Continue vancomycin 1000 mg IV q18h VT goal 15-20 mcg/mL VT ordered 1/12 @ 0730  Height: 5' 2"  (157.5 cm) Weight: 158 lb 11.2 oz (72 kg) IBW/kg (Calculated) : 50.1  Temp (24hrs), Avg:98.9 F (37.2 C), Min:98.8 F (37.1 C), Max:99.1 F (37.3 C)  Recent Labs  Lab 04/27/17 0810 04/27/17 1140 04/27/17 1758 04/28/17 0638 04/29/17 0320  WBC 8.6  --   --  10.2 8.6  CREATININE 0.67  --   --  0.57 0.62  LATICACIDVEN  --  2.6* 2.0*  --   --     Estimated Creatinine Clearance: 55.6 mL/min (by C-G formula based on SCr of 0.62 mg/dL).    Allergies  Allergen Reactions  . Feldene [Piroxicam]   . Penicillamine Other (See Comments)  . Qualaquin [Quinine]     Antimicrobials this admission: vancomycin 1/9 >>  Piperacillin/tazobactam 1/9 >>   Dose adjustments this admission:  Microbiology results: None  Thank you for allowing pharmacy to be a part of this patient's care.  Lenis Noon, PharmD, BCPS Clinical Pharmacist 04/29/2017 8:41 AM

## 2017-04-29 NOTE — Consult Note (Signed)
[facility] Infectious Disease     Reason for Consult: sepsis    Referring Physician: Carlynn Spry Date of Admission:  04/27/2017   Principal Problem:   Sepsis ([place]) Active Problems:   Hyperbilirubinemia   Hypomagnesemia   HPI: Regina Hood is a 77 y.o. female   Past Medical History:  Diagnosis Date  . Arthritis   . Cancer ([place])   . Cellulitis   . Collagen vascular disease ([place])   . COPD (chronic obstructive pulmonary disease) ([place])   . DDD (degenerative disc disease), lumbar   . Depression   . Diabetes mellitus   . Diverticulitis   . GERD (gastroesophageal reflux disease)   . GI bleed   . Headache(784.0)   . Hypertension   . Lupus   . Ulcerative colitis [facility])    Past Surgical History:  Procedure Laterality Date  . ABDOMINAL HYSTERECTOMY    . BREAST BIOPSY Right   . COLONOSCOPY    . COLONOSCOPY WITH PROPOFOL N/A 08/18/2016   Procedure: COLONOSCOPY WITH PROPOFOL;  Surgeon: Manya Silvas, MD;  Location: [facility] ENDOSCOPY;  Service: Endoscopy;  Laterality: N/A;  . ESOPHAGOGASTRODUODENOSCOPY (EGD) WITH PROPOFOL N/A 08/18/2016   Procedure: ESOPHAGOGASTRODUODENOSCOPY (EGD) WITH PROPOFOL;  Surgeon: Manya Silvas, MD;  Location: [facility] ENDOSCOPY;  Service: Endoscopy;  Laterality: N/A;  . ORIF HUMERUS FRACTURE  09/17/2011   Procedure: OPEN REDUCTION INTERNAL FIXATION (ORIF) PROXIMAL HUMERUS FRACTURE;  Surgeon: Augustin Schooling, MD;  Location: [place];  Service: Orthopedics;  Laterality: Left;   Social History   Tobacco Use  . Smoking status: Former Smoker    Packs/day: 1.00    Years: 10.00    Pack years: 10.00    Types: Cigarettes    Last attempt to quit: 12/30/1998    Years since quitting: 18.3  . Smokeless tobacco: Never Used  . Tobacco comment: quit smoking in 2003  Substance Use Topics  . Alcohol use: No  . Drug use: No   Family History  Problem Relation Age of Onset  . Breast cancer Mother 4  . COPD Father   . Prostate cancer Brother     Allergies:  Allergies   Allergen Reactions  . Feldene [Piroxicam]   . Penicillamine Other (See Comments)  . Qualaquin [Quinine]     Current antibiotics: Antibiotics Given (last 72 hours)    Date/Time Action Medication Dose Rate   04/27/17 1845 New Bag/Given   piperacillin-tazobactam (ZOSYN) IVPB 3.375 g 3.375 g 12.5 mL/hr   04/27/17 2202 New Bag/Given   vancomycin (VANCOCIN) IVPB 1000 mg/200 mL premix 1,000 mg 200 mL/hr   04/27/17 2343 Given   hydroxychloroquine (PLAQUENIL) tablet 200 mg 200 mg    04/28/17 0200 New Bag/Given   vancomycin (VANCOCIN) IVPB 1000 mg/200 mL premix 1,000 mg 200 mL/hr   04/28/17 0200 New Bag/Given   piperacillin-tazobactam (ZOSYN) IVPB 3.375 g 3.375 g 12.5 mL/hr   04/28/17 1044 New Bag/Given   piperacillin-tazobactam (ZOSYN) IVPB 3.375 g 3.375 g 12.5 mL/hr   04/28/17 1046 Given   hydroxychloroquine (PLAQUENIL) tablet 200 mg 200 mg    04/28/17 1723 New Bag/Given   piperacillin-tazobactam (ZOSYN) IVPB 3.375 g 3.375 g 12.5 mL/hr   04/28/17 2005 New Bag/Given   vancomycin (VANCOCIN) IVPB 1000 mg/200 mL premix 1,000 mg 200 mL/hr   04/28/17 2008 Given  [pt request]   hydroxychloroquine (PLAQUENIL) tablet 200 mg 200 mg    04/29/17 0219 New Bag/Given   piperacillin-tazobactam (ZOSYN) IVPB 3.375 g 3.375 g 12.5 mL/hr  04/29/17 0910 New Bag/Given   piperacillin-tazobactam (ZOSYN) IVPB 3.375 g 3.375 g 12.5 mL/hr   04/29/17 0913 Given   hydroxychloroquine (PLAQUENIL) tablet 200 mg 200 mg    04/29/17 1430 New Bag/Given   vancomycin (VANCOCIN) IVPB 1000 mg/200 mL premix 1,000 mg 200 mL/hr      MEDICATIONS: . acetaminophen  1,000 mg Oral TID  . [START ON 04/30/2017] amLODipine  10 mg Oral Daily  . dexamethasone  2 mg Oral Q breakfast  . estradiol  1 mg Oral Daily  . gabapentin  100 mg Oral TID  . heparin  5,000 Units Subcutaneous Q8H  . hydroxychloroquine  200 mg Oral BID  . insulin aspart  0-9 Units Subcutaneous TID WC  . metoprolol tartrate  25 mg Oral BID  .  mometasone-formoterol  2 puff Inhalation BID  . pantoprazole  40 mg Oral Daily  . senna  2 tablet Oral Daily  . sodium chloride  10 mL Intracatheter Q12H  . sodium chloride flush  1 mL Other Q8H  . venlafaxine XR  225 mg Oral Daily  . vitamin B-12  1,000 mcg Oral Daily    Review of Systems - 11 systems reviewed and negative per HPI   OBJECTIVE: Temp:  [98.8 F (37.1 C)] 98.8 F (37.1 C) (01/11 1311) Pulse Rate:  [89-119] 89 (01/11 1311) Resp:  [18-20] 20 (01/11 0901) BP: (136-172)/(69-73) 136/72 (01/11 1421) SpO2:  [92 %-100 %] 100 % (01/11 1311)   LABS: Results for orders placed or performed during the hospital encounter of 04/27/17 (from the past 48 hour(s))  Lactic acid, plasma     Status: Abnormal   Collection Time: 04/27/17  5:58 PM  Result Value Ref Range   Lactic Acid, Venous 2.0 (HH) 0.5 - 1.9 mmol/L    Comment: CRITICAL RESULT CALLED TO, READ BACK BY AND VERIFIED WITH MADDIE SENTON 04/27/17 1851 KLW Performed at [facility], [address] [place], [place] [address]   Basic metabolic panel     Status: Abnormal   Collection Time: 04/28/17  6:38 AM  Result Value Ref Range   Sodium 136 135 - 145 mmol/L   Potassium 3.1 (L) 3.5 - 5.1 mmol/L   Chloride 100 (L) 101 - 111 mmol/L   CO2 26 22 - 32 mmol/L   Glucose, Bld 97 65 - 99 mg/dL   BUN 9 6 - 20 mg/dL   Creatinine, Ser 0.57 0.44 - 1.00 mg/dL   Calcium 8.1 (L) 8.9 - 10.3 mg/dL   GFR calc non Af Amer >60 >60 mL/min   GFR calc Af Amer >60 >60 mL/min    Comment: (NOTE) The eGFR has been calculated using the CKD EPI equation. This calculation has not been validated in all clinical situations. eGFR's persistently <60 mL/min signify possible Chronic Kidney Disease.    Anion gap 10 5 - 15    Comment: Performed at [facility], [address] [place], [place] [address]  CBC     Status: Abnormal   Collection Time: 04/28/17  6:38 AM  Result Value Ref Range   WBC 10.2 3.6 - 11.0 K/uL   RBC 2.92  (L) 3.80 - 5.20 MIL/uL   Hemoglobin 9.0 (L) 12.0 - 16.0 g/dL   HCT 27.4 (L) 35.0 - 47.0 %   MCV 93.8 80.0 - 100.0 fL   MCH 30.9 26.0 - 34.0 pg   MCHC 32.9 32.0 - 36.0 g/dL   RDW 16.6 (H) 11.5 - 14.5 %   Platelets  194 150 - 440 K/uL    Comment: Performed at [facility], [address] [place], [place] [address]  Magnesium     Status: Abnormal   Collection Time: 04/28/17  6:38 AM  Result Value Ref Range   Magnesium 1.3 (L) 1.7 - 2.4 mg/dL    Comment: Performed at [facility], [address] [place], [place] [address]  Phosphorus     Status: Abnormal   Collection Time: 04/28/17  6:38 AM  Result Value Ref Range   Phosphorus 2.3 (L) 2.5 - 4.6 mg/dL    Comment: Performed at [facility], [address] [place], [place] [address]  Glucose, capillary     Status: Abnormal   Collection Time: 04/28/17  7:35 AM  Result Value Ref Range   Glucose-Capillary 109 (H) 65 - 99 mg/dL  Glucose, capillary     Status: None   Collection Time: 04/28/17 10:05 AM  Result Value Ref Range   Glucose-Capillary 86 65 - 99 mg/dL  Glucose, capillary     Status: Abnormal   Collection Time: 04/28/17  4:47 PM  Result Value Ref Range   Glucose-Capillary 185 (H) 65 - 99 mg/dL  Glucose, capillary     Status: Abnormal   Collection Time: 04/28/17  7:52 PM  Result Value Ref Range   Glucose-Capillary 142 (H) 65 - 99 mg/dL  CBC     Status: Abnormal   Collection Time: 04/29/17  3:20 AM  Result Value Ref Range   WBC 8.6 3.6 - 11.0 K/uL   RBC 3.21 (L) 3.80 - 5.20 MIL/uL   Hemoglobin 9.9 (L) 12.0 - 16.0 g/dL   HCT 30.3 (L) 35.0 - 47.0 %   MCV 94.6 80.0 - 100.0 fL   MCH 30.8 26.0 - 34.0 pg   MCHC 32.6 32.0 - 36.0 g/dL   RDW 16.7 (H) 11.5 - 14.5 %   Platelets 186 150 - 440 K/uL    Comment: Performed at [facility], [address] [place], [place] [address]  Basic metabolic panel     Status: Abnormal   Collection Time: 04/29/17  3:20 AM  Result Value Ref Range   Sodium  138 135 - 145 mmol/L   Potassium 3.4 (L) 3.5 - 5.1 mmol/L   Chloride 101 101 - 111 mmol/L   CO2 28 22 - 32 mmol/L   Glucose, Bld 87 65 - 99 mg/dL   BUN 13 6 - 20 mg/dL   Creatinine, Ser 0.62 0.44 - 1.00 mg/dL   Calcium 8.3 (L) 8.9 - 10.3 mg/dL   GFR calc non Af Amer >60 >60 mL/min   GFR calc Af Amer >60 >60 mL/min    Comment: (NOTE) The eGFR has been calculated using the CKD EPI equation. This calculation has not been validated in all clinical situations. eGFR's persistently <60 mL/min signify possible Chronic Kidney Disease.    Anion gap 9 5 - 15    Comment: Performed at [facility], [address] [place],  [address]  Hepatic function panel     Status: Abnormal   Collection Time: 04/29/17  3:20 AM  Result Value Ref Range   Total Protein 6.7 6.5 - 8.1 g/dL   Albumin 2.9 (L) 3.5 - 5.0 g/dL   AST 63 (H) 15 - 41 U/L   ALT 51 14 - 54 U/L   Alkaline Phosphatase 297 (H) 38 - 126 U/L   Total Bilirubin 3.1 (H) 0.3 - 1.2 mg/dL   Bilirubin, Direct  1.9 (H) 0.1 - 0.5 mg/dL   Indirect Bilirubin 1.2 (H) 0.3 - 0.9 mg/dL    Comment: Performed at [facility], [address] [place], [place] [address]  Glucose, capillary     Status: None   Collection Time: 04/29/17  7:40 AM  Result Value Ref Range   Glucose-Capillary 73 65 - 99 mg/dL  Glucose, capillary     Status: Abnormal   Collection Time: 04/29/17 11:41 AM  Result Value Ref Range   Glucose-Capillary 146 (H) 65 - 99 mg/dL  Glucose, capillary     Status: Abnormal   Collection Time: 04/29/17  2:21 PM  Result Value Ref Range   Glucose-Capillary 170 (H) 65 - 99 mg/dL   No components found for: ESR, C REACTIVE PROTEIN MICRO: No results found for this or any previous visit (from the past 720 hour(s)).  IMAGING: Dg Chest 2 View  Result Date: 04/27/2017 CLINICAL DATA:  Several years of shortness of breath with onset of weakness several months ago. History of hepatic malignancy on chemotherapy. Also history of  COPD, former smoker, diabetes, and lupus. EXAM: CHEST  2 VIEW COMPARISON:  Chest x-ray and chest CT scan of March 16, 2017 FINDINGS: The lungs are adequately inflated. The interstitial markings are coarse but are not as conspicuous as on the previous study. The heart is normal in size. The pulmonary vascularity is not clearly engorged. There is no pleural effusion. The porta catheter tip projects over the midportion of the SVC. The mediastinum is normal in width. The bony thorax exhibits no acute abnormality. Drainage catheters are present in the upper abdomen. IMPRESSION: Chronic interstitial changes throughout both lungs less conspicuous than on the previous study. No definite acute pneumonia nor CHF. No findings suspicious for metastatic disease. Electronically Signed   By: Rosangela Fehrenbach  [place] M.D.   On: 04/27/2017 10:04   Ct Abdomen Pelvis W Contrast  Result Date: 04/29/2017 CLINICAL DATA:  Abdominal distention. Lactic acidosis. History of metastatic cholangiocarcinoma. EXAM: CT ABDOMEN AND PELVIS WITH CONTRAST TECHNIQUE: Multidetector CT imaging of the abdomen and pelvis was performed using the standard protocol following bolus administration of intravenous contrast. CONTRAST:  139m ISOVUE-300 IOPAMIDOL (ISOVUE-300) INJECTION 61% COMPARISON:  04/18/2017 FINDINGS: Lower chest: Bilateral calcified pleural plaques. Small lymph nodes in the retrocrural space bilaterally are similar to prior. Chronic interstitial changes are noted in the lung bases bilaterally. Hepatobiliary: Previously described hypoenhancing lesion and segment III of the liver is similar measuring 2.5 x 2.1 cm today compared to 2.8 x 2.5 cm previously. Atrophy of the lateral segment left liver again noted. No focal abnormality in the right liver. 2 internal external biliary drains are evident with distal loop formed in the duodenal lumen. Gallbladder is decompressed with gas in the lumen compatible with the presence of the biliary drains.  Pancreas: No focal mass lesion. No dilatation of the main duct. No intraparenchymal cyst. No peripancreatic edema. Spleen: No splenomegaly. No focal mass lesion. Adrenals/Urinary Tract: No adrenal nodule or mass. Kidneys are unremarkable. No evidence for hydroureter. The urinary bladder appears normal for the degree of distention. Stomach/Bowel: Stomach is nondistended. No gastric wall thickening. No evidence of outlet obstruction. Duodenum is normally positioned as is the ligament of Treitz. No small bowel wall thickening. No small bowel dilatation. The terminal ileum is normal. The appendix is normal. Diverticular changes are noted in the left colon without evidence of diverticulitis. Vascular/Lymphatic: There is abdominal aortic atherosclerosis without aneurysm. Scattered small lymph nodes in the upper abdomen are  stable in the interval. No pelvic sidewall lymphadenopathy. Reproductive: Uterus surgically absent. Similar appearance small follicle left ovary. Other: No intraperitoneal free fluid. Musculoskeletal: Bone windows reveal no worrisome lytic or sclerotic osseous lesions. IMPRESSION: 1. Stable exam.  No new or progressive findings. 2. Approximately 3 cm hypoenhancing lesion in the left liver is similar to prior. 3. Stable appearance of the 2 biliary drains. 4. Similar appearance retrocrural and upper abdominal small lymph nodes. 5.  Aortic Atherosclerois (ICD10-170.0) Electronically Signed   By: Misty Stanley M.D.   On: 04/29/2017 14:45   Ct Abdomen Pelvis W Contrast  Result Date: 04/18/2017 CLINICAL DATA:  Cholangiocarcinoma diagnosed 1 year ago, on chemotherapy EXAM: CT ABDOMEN AND PELVIS WITH CONTRAST TECHNIQUE: Multidetector CT imaging of the abdomen and pelvis was performed using the standard protocol following bolus administration of intravenous contrast. CONTRAST:  136m ISOVUE-300 IOPAMIDOL (ISOVUE-300) INJECTION 61% COMPARISON:  02/24/2017 FINDINGS: Lower chest: Fibrotic changes at the lung  bases, nonspecific. Calcified pleural plaques bilaterally, suggesting asbestos related pleural disease. Hepatobiliary: 2.8 x 2.5 cm ill-defined hypoenhancing mass in segment 3 (series 2/image 15), previously 3.0 x 3.2 cm. Associated atrophy of the lateral segment left hepatic lobe with focal biliary ductal dilatation and two internal external biliary drain, unchanged. Gallbladder is decompressed. Pancreas: Within normal limits. Spleen: Within normal limits. Adrenals/Urinary Tract: Adrenal glands are within normal limits. Malrotated right kidney. Kidneys are otherwise within normal limits. No hydronephrosis. Bladder is within normal limits. Stomach/Bowel: Stomach is within normal limits. No evidence of bowel obstruction. Normal appendix (series 2/ image 45). Left colonic diverticulosis, without evidence of diverticulitis. Moderate colonic stool burden, suggesting constipation. Vascular/Lymphatic: No evidence of abdominal aortic aneurysm. Retroaortic left renal vein. Atherosclerotic calcifications of the abdominal aorta and branch vessels. Small nodes in the porta hepatis measuring up to 11 mm short axis (series 2/ image 23), previously 12 mm. Reproductive: Status post hysterectomy. Right ovary is within normal limits. 19 mm left ovarian cyst (series 2/image 63), unchanged. Other: No abdominopelvic ascites. Postsurgical changes along the upper midline anterior abdominal wall (series 2/image 20), unchanged. Musculoskeletal: Degenerative changes of the visualized thoracolumbar spine. IMPRESSION: 2.8 x 2.5 cm hypoenhancing mass in segment 3 of the liver, mildly decreased. Associated atrophy of the lateral segment left hepatic lobe. Two indwelling biliary stent, unchanged. Small nodes in the porta hepatis measuring up to 11 mm short axis, stable versus mildly decreased. No evidence of new/progressive metastatic disease. Electronically Signed   By: SJulian HyM.D.   On: 04/18/2017 10:20   Dg Chest Port 1  View  Result Date: 04/29/2017 CLINICAL DATA:  Shortness of breath with wheezing EXAM: PORTABLE CHEST 1 VIEW COMPARISON:  April 27, 2016 FINDINGS: Port-A-Cath tip is in the superior vena cava.  No pneumothorax. Postoperative changes noted on the right. There is interstitial thickening bilaterally without frank edema or consolidation. Heart is upper normal in size with pulmonary vascular within normal limits. No adenopathy. Postoperative change in the proximal left humerus is noted. IMPRESSION: Stable interstitial thickening and postoperative change on the right. No frank edema or consolidation. Stable cardiac silhouette. No pneumothorax. Electronically Signed   By: WLowella GripIII M.D.   On: 04/29/2017 07:53   UKoreaAbdomen Limited Ruq  Result Date: 04/28/2017 CLINICAL DATA:  Elevated bilirubin and history of cholangiocarcinoma EXAM: ULTRASOUND ABDOMEN LIMITED RIGHT UPPER QUADRANT COMPARISON:  04/18/2017 FINDINGS: Gallbladder: Gallbladder is decompressed.  No definitive gallstones are seen. Common bile duct: Diameter: Biliary catheters are noted within the common bile duct. Accurate  measurement of the common bile duct is unable to be performed. Liver: Stents are noted extending through the left and right lobes into the common bile duct. The known area of abnormal attenuation on recent CT is not well appreciated on this exam. No biliary ductal dilatation is seen. Portal vein is patent on color Doppler imaging with normal direction of blood flow towards the liver. IMPRESSION: Changes consistent with bilateral biliary drainage catheters Decompressed gallbladder. No other focal abnormality is seen. Electronically Signed   By: Inez Catalina M.D.   On: 04/28/2017 10:04    Assessment:   EvaSmithis a76 y.o.femalewith a known history of Cholangiocarcinoma, DM, Htn- had biliary drainage tube placed at [facility] last month and due to leakage- changed 1/4 without complication. The tubes are internally drained and  pt just need to flush it daily. Went to Cancer center for check up, abd was distended. No fever/ pain. Noted to have lactic acid 2.6, T bili 2.8 up from 0.5 on 1/2  and tachycardia. CXR chronic interstitial changes. Given Vanc + Zosyn and fluids and sent for direct admit.  Does have portacath but no report of issues with it. US done shows nml biliary ducts and no evidence of obstruction or cholecystitis. CT neg as well.   No fevers since admit and HR down to 90s and BP 140/64.  Feeling better since admit.   Rec Can dc vanco Cont zosyn with eventual plan for dc (possibly tomorrow) on oral augmentin for 7 total abx day if improved and all cultures negative.      Recommendations  Thank you very much for allowing me to participate in the care of this patient. Please call with questions.   Cheral Marker. Ola Spurr, MD

## 2017-04-29 NOTE — Progress Notes (Signed)
Family Meeting Note  Advance Directive:yes  Today a meeting took place with the Patient.  The following clinical team members were present during this meeting:MD  The following were discussed:Patient's diagnosis:   77 y.o. female with history of ulcerative colitis not on any medications, lupus and sarcoidosis, metastatic cholangiocarcinoma on gemcitabine with partial response, external/internal biliary drains with recent exchange on 04/22/2017 directly admitted from oncology clinic secondary to possible sepsis and hyperbilirubinemia.  Patient's progosis: > 12 months and Goals for treatment: DNR  Additional follow-up to be provided: Palliative care eval if she is still here till Monday, if not ongoing discussion and consideration for outpt f/up  Time spent during discussion:20 minutes  Max Sane, MD

## 2017-04-29 NOTE — Care Management Important Message (Signed)
Important Message  Patient Details  Name: Regina Hood MRN: [id number] Date of Birth: [birth year]   Medicare Important Message Given:  Yes    Shelbie Ammons, RN 04/29/2017, 1:44 PM

## 2017-04-29 NOTE — Consult Note (Signed)
Cephas Darby, MD [address]  [place]  [place], [place] [address]  Main: [phone]  Fax: [phone] Pager: [phone]   Consultation  Referring Provider:     No ref. provider found Primary Care Physician:  Idelle Crouch, MD Primary Gastroenterologist:  Dr Tiffany Kocher      Reason for Consultation:  Jaundice  Date of Admission:  04/27/2017 Date of Consultation:  04/29/2017         HPI:   Regina Hood is a 77 y.o. female with history of metastatic cholangiocarcinoma, has been on gemcitabine treatment with partial response, last chemotherapy on 04/06/2017. She is admitted on 04/27/2017 directly from oncology clinic as she was found to have tachycardia concerning for sepsis. Patient had external/internal biliary drain that was exchanged at [facility] by IR on 04/22/2017 secondary to leakage from one of her drains. Her T bili was 0.5 on 04/20/2017, increased to 2.8 when she saw Dr. Tasia Catchings at [place]. Her T bili is 3.1 today, D bili 1.9. She has been receiving IV antibiotics since then. She underwent CT A/P today which revealed stable disease and no evidence of obvious biliary ductal dilation.   On detailed history, patient reports that she is generally weak but felt even weaker after the drain exchange, reported one episode of vomiting after the procedure, extreme fatigue and poor by mouth intake, chills but no fever. She felt worse on 04/27/2017 when she saw Dr Tasia Catchings. She denies abdominal pain, distention, fever, nausea, vomiting, diarrhea. She denies dysuria, shortness of breath, URI symptoms either. She denies taking any new medications including prescription or over-the-counter medications or herbal supplements. She denies any sick contacts. Her dexamethasone has been tapered to 1 pill daily for last 10 days. She is evaluated by ID who recommended to stop vancomycin, continue Zosyn. Blood cultures in process.  She has history of ulcerative colitis, found to have moderate chronic  active colitis in the right colon based on colonoscopy in 08/2016. She is not on any medications currently for UC, She used to be on sulfasalazine and folic acid. She denies having any lower GI symptoms. She did have iron deficiency anemia in the past. Most recent ferritin 7.2 in 01/2017.  NSAIDs: None  Antiplts/Anticoagulants/Anti thrombotics: none  GI Procedures:  Colonoscopy by Dr. Vira Agar on 08/18/16 - One 10 mm polyp in the proximal ascending colon, removed with a hot snare. Resected and retrieved. Clips were placed. - Colitis. Inflammation was found. This was mild in severity. Biopsied. - Diverticulosis in the sigmoid colon, in the descending colon and in the transverse colon. - Internal hemorrhoids.  Pathology DIAGNOSIS:  A. RIGHT COLON, CECUM; COLD BIOPSY:  - MODERATE CHRONIC ACTIVE COLITIS WITH ULCER DEBRIS.  - NEGATIVE FOR DYSPLASIA AND MALIGNANCY.   B. PROXIMAL ASCENDING COLON; COLD BIOPSY:  - MODERATE CHRONIC ACTIVE COLITIS WITH ULCER DEBRIS.  - NEGATIVE FOR DYSPLASIA AND MALIGNANCY.   C. COLON POLYP, PROXIMAL ASCENDING; HOT SNARE:  - SESSILE SERRATED ADENOMA.  - NEGATIVE FOR CYTOLOGIC DYSPLASIA AND MALIGNANCY.   D. STOMACH, BODY; COLD BIOPSY:  - MINIMAL CHRONIC GASTRITIS AND PROTONPUMP INHIBITOR EFFECT.  - NEGATIVE FOR H. PYLORI, DYSPLASIA AND MALIGNANCY.   E. STOMACH NODULE, CARDIA; COLD BIOPSY:  - HYPERPLASTIC FOVEOLAR HYPERPLASIA.  - NEGATIVE FOR H. PYLORI, DYSPLASIA AND MALIGNANCY.   Past Medical History:  Diagnosis Date  . Arthritis   . Cancer ([place])   . Cellulitis   . Collagen vascular disease ([place])   . COPD (chronic  obstructive pulmonary disease) ([place])   . DDD (degenerative disc disease), lumbar   . Depression   . Diabetes mellitus   . Diverticulitis   . GERD (gastroesophageal reflux disease)   . GI bleed   . Headache(784.0)   . Hypertension   . Lupus   . Ulcerative colitis [facility])     Past Surgical History:  Procedure Laterality Date  .  ABDOMINAL HYSTERECTOMY    . BREAST BIOPSY Right   . COLONOSCOPY    . COLONOSCOPY WITH PROPOFOL N/A 08/18/2016   Procedure: COLONOSCOPY WITH PROPOFOL;  Surgeon: Manya Silvas, MD;  Location: [facility] ENDOSCOPY;  Service: Endoscopy;  Laterality: N/A;  . ESOPHAGOGASTRODUODENOSCOPY (EGD) WITH PROPOFOL N/A 08/18/2016   Procedure: ESOPHAGOGASTRODUODENOSCOPY (EGD) WITH PROPOFOL;  Surgeon: Manya Silvas, MD;  Location: [facility] ENDOSCOPY;  Service: Endoscopy;  Laterality: N/A;  . ORIF HUMERUS FRACTURE  09/17/2011   Procedure: OPEN REDUCTION INTERNAL FIXATION (ORIF) PROXIMAL HUMERUS FRACTURE;  Surgeon: Augustin Schooling, MD;  Location: [place];  Service: Orthopedics;  Laterality: Left;    Prior to Admission medications   Medication Sig Start Date End Date Taking? Authorizing Provider  acetaminophen (TYLENOL) 500 MG tablet Take 1,000 mg by mouth 3 (three) times daily.    [provider]  amLODipine (NORVASC) 5 MG tablet Take 5 mg by mouth daily. 06/01/16   [provider]  calcium carbonate 100 mg/ml SUSP Take by mouth.    [provider]  Cyanocobalamin (VITAMIN B-12 PO) Take 1,000 mcg by mouth daily.    [provider]  dexamethasone (DECADRON) 1 MG tablet Take 2 tablets (2 mg total) by mouth daily with breakfast. 03/23/17   Earlie Server, MD  Elastic Bandages & Supports ([place]) [place] 2 each by Does not apply route daily. 03/09/17   Earlie Server, MD  estradiol (ESTRACE) 1 MG tablet Take 1 mg by mouth daily. 06/16/16   [provider]  Fluticasone-Salmeterol (ADVAIR DISKUS) 250-50 MCG/DOSE AEPB Inhale into the lungs. 06/01/16 06/01/17  [provider]  gabapentin (NEURONTIN) 100 MG capsule Take 100 mg by mouth 3 (three) times daily.     [provider]  hydroxychloroquine (PLAQUENIL) 200 MG tablet Take 200 mg by mouth 2 (two) times daily.     [provider]  lidocaine-prilocaine (EMLA) cream Apply 1 application topically as needed.  02/16/17   Cammie Sickle, MD  LORazepam (ATIVAN) 0.5 MG tablet Take 0.5 mg every 6 (six) hours as needed by mouth. 01/05/17   [provider]  metFORMIN (GLUCOPHAGE) 500 MG tablet Take 500 mg by mouth 2 (two) times daily with a meal.    [provider]  ondansetron (ZOFRAN) 8 MG tablet TAKE ONE TABLET EVERY 12 HOURS AS NEEDED 04/06/17   Earlie Server, MD  oxyCODONE (OXY IR/ROXICODONE) 5 MG immediate release tablet Take 1 tablet (5 mg total) by mouth every 6 (six) hours as needed. 01/12/17   Earlie Server, MD  pantoprazole (PROTONIX) 40 MG tablet Take 40 mg by mouth daily.    [provider]  polyethylene glycol powder (GLYCOLAX/MIRALAX) powder Take 0.5 Containers by mouth.  09/25/16   [provider]  potassium chloride SA (K-DUR,KLOR-CON) 20 MEQ tablet Take 1 tablet (20 mEq total) by mouth daily as needed (low potassium). 03/09/17   Earlie Server, MD  prochlorperazine (COMPAZINE) 10 MG tablet Take 1 tablet (10 mg total) by mouth every 6 (six) hours as needed. 02/16/17   Cammie Sickle, MD  senna (SENOKOT) 8.6  MG TABS tablet Take 2 tablets (17.2 mg total) by mouth daily. Hold for loose stools. 02/16/17   Cammie Sickle, MD  sodium chloride 0.9 % injection Flush each biliary drains (2) with 10 mL two times daily. 03/18/17   [provider]  Sodium Chloride Flush (NORMAL SALINE FLUSH) 0.9 % SOLN 1 mL by Other route every 8 (eight) hours. Flush each biliary drain with 1 mL twice daily 02/16/17   Cammie Sickle, MD  triamcinolone ointment (KENALOG) 0.1 % Apply 1 application topically 2 (two) times daily. 03/09/17   Earlie Server, MD  venlafaxine XR (EFFEXOR-XR) 75 MG 24 hr capsule Take 225 mg by mouth daily.     [provider]  zolpidem (AMBIEN) 10 MG tablet Take 1 tablet (10 mg total) by mouth at bedtime as needed. 02/16/17 04/20/17  Cammie Sickle, MD    Family History  Problem Relation Age of Onset  . Breast cancer Mother 84  . COPD  Father   . Prostate cancer Brother      Social History   Tobacco Use  . Smoking status: Former Smoker    Packs/day: 1.00    Years: 10.00    Pack years: 10.00    Types: Cigarettes    Last attempt to quit: 12/30/1998    Years since quitting: 18.3  . Smokeless tobacco: Never Used  . Tobacco comment: quit smoking in 2003  Substance Use Topics  . Alcohol use: No  . Drug use: No    Allergies as of 04/27/2017 - Review Complete 04/27/2017  Allergen Reaction Noted  . Feldene [piroxicam]  08/17/2016  . Penicillamine Other (See Comments) 10/03/2013  . Qualaquin [quinine]  08/17/2016    Review of Systems:    All systems reviewed and negative except where noted in HPI.   Physical Exam:  Vital signs in last 24 hours: Temp:  [97.7 F (36.5 C)-98.8 F (37.1 C)] 97.7 F (36.5 C) (01/11 1944) Pulse Rate:  [89-119] 90 (01/11 1944) Resp:  [18-20] 20 (01/11 1944) BP: (135-172)/(65-73) 135/65 (01/11 1944) SpO2:  [92 %-100 %] 99 % (01/11 1944) Last BM Date: 04/28/17 General:   Pleasant, cooperative in NAD Head:  Normocephalic and atraumatic. Eyes:   No icterus.   Conjunctiva pink. PERRLA. Ears:  Normal auditory acuity. Neck:  Supple; no masses or thyroidomegaly Lungs: Respirations even and unlabored. Lungs clear to auscultation bilaterally.   No wheezes, crackles, or rhonchi.  Heart:  Regular rate and rhythm;  Without murmur, clicks, rubs or gallops Abdomen:  Soft, diffusely distended secondary to abdominal fat, nontender. 2 external drains appreciated, nontender and no leakage. Normal bowel sounds. No appreciable masses or hepatomegaly.  No rebound or guarding.  Rectal:  Not performed. Msk:  Symmetrical without gross deformities.  Strength normal Extremities:  Without edema, cyanosis or clubbing. Neurologic:  Alert and oriented x3;  grossly normal neurologically. Skin:  Intact without significant lesions or rashes. Psych:  Alert and cooperative. Normal affect.  LAB RESULTS: CBC  Latest Ref Rng & Units 04/29/2017 04/28/2017 04/27/2017  WBC 3.6 - 11.0 K/uL 8.6 10.2 8.6  Hemoglobin 12.0 - 16.0 g/dL 9.9(L) 9.0(L) 10.1(L)  Hematocrit 35.0 - 47.0 % 30.3(L) 27.4(L) 30.1(L)  Platelets 150 - 440 K/uL 186 194 229    BMET BMP Latest Ref Rng & Units 04/29/2017 04/28/2017 04/27/2017  Glucose 65 - 99 mg/dL 87 97 107(H)  BUN 6 - 20 mg/dL 13 9 12   Creatinine 0.44 - 1.00 mg/dL 0.62 0.57 0.67  Sodium  135 - 145 mmol/L 138 136 136  Potassium 3.5 - 5.1 mmol/L 3.4(L) 3.1(L) 3.0(L)  Chloride 101 - 111 mmol/L 101 100(L) 98(L)  CO2 22 - 32 mmol/L 28 26 27   Calcium 8.9 - 10.3 mg/dL 8.3(L) 8.1(L) 8.6(L)    LFT Hepatic Function Latest Ref Rng & Units 04/29/2017 04/27/2017 04/20/2017  Total Protein 6.5 - 8.1 g/dL 6.7 7.1 6.5  Albumin 3.5 - 5.0 g/dL 2.9(L) 3.0(L) 3.0(L)  AST 15 - 41 U/L 63(H) 73(H) 60(H)  ALT 14 - 54 U/L 51 52 71(H)  Alk Phosphatase 38 - 126 U/L 297(H) 301(H) 290(H)  Total Bilirubin 0.3 - 1.2 mg/dL 3.1(H) 2.8(H) 0.5  Bilirubin, Direct 0.1 - 0.5 mg/dL 1.9(H) - -     STUDIES: Ct Abdomen Pelvis W Contrast  Result Date: 04/29/2017 CLINICAL DATA:  Abdominal distention. Lactic acidosis. History of metastatic cholangiocarcinoma. EXAM: CT ABDOMEN AND PELVIS WITH CONTRAST TECHNIQUE: Multidetector CT imaging of the abdomen and pelvis was performed using the standard protocol following bolus administration of intravenous contrast. CONTRAST:  165m ISOVUE-300 IOPAMIDOL (ISOVUE-300) INJECTION 61% COMPARISON:  04/18/2017 FINDINGS: Lower chest: Bilateral calcified pleural plaques. Small lymph nodes in the retrocrural space bilaterally are similar to prior. Chronic interstitial changes are noted in the lung bases bilaterally. Hepatobiliary: Previously described hypoenhancing lesion and segment III of the liver is similar measuring 2.5 x 2.1 cm today compared to 2.8 x 2.5 cm previously. Atrophy of the lateral segment left liver again noted. No focal abnormality in the right liver. 2 internal  external biliary drains are evident with distal loop formed in the duodenal lumen. Gallbladder is decompressed with gas in the lumen compatible with the presence of the biliary drains. Pancreas: No focal mass lesion. No dilatation of the main duct. No intraparenchymal cyst. No peripancreatic edema. Spleen: No splenomegaly. No focal mass lesion. Adrenals/Urinary Tract: No adrenal nodule or mass. Kidneys are unremarkable. No evidence for hydroureter. The urinary bladder appears normal for the degree of distention. Stomach/Bowel: Stomach is nondistended. No gastric wall thickening. No evidence of outlet obstruction. Duodenum is normally positioned as is the ligament of Treitz. No small bowel wall thickening. No small bowel dilatation. The terminal ileum is normal. The appendix is normal. Diverticular changes are noted in the left colon without evidence of diverticulitis. Vascular/Lymphatic: There is abdominal aortic atherosclerosis without aneurysm. Scattered small lymph nodes in the upper abdomen are stable in the interval. No pelvic sidewall lymphadenopathy. Reproductive: Uterus surgically absent. Similar appearance small follicle left ovary. Other: No intraperitoneal free fluid. Musculoskeletal: Bone windows reveal no worrisome lytic or sclerotic osseous lesions. IMPRESSION: 1. Stable exam.  No new or progressive findings. 2. Approximately 3 cm hypoenhancing lesion in the left liver is similar to prior. 3. Stable appearance of the 2 biliary drains. 4. Similar appearance retrocrural and upper abdominal small lymph nodes. 5.  Aortic Atherosclerois (ICD10-170.0) Electronically Signed   By: EMisty StanleyM.D.   On: 04/29/2017 14:45   Dg Chest Port 1 View  Result Date: 04/29/2017 CLINICAL DATA:  Shortness of breath with wheezing EXAM: PORTABLE CHEST 1 VIEW COMPARISON:  April 27, 2016 FINDINGS: Port-A-Cath tip is in the superior vena cava.  No pneumothorax. Postoperative changes noted on the right. There is  interstitial thickening bilaterally without frank edema or consolidation. Heart is upper normal in size with pulmonary vascular within normal limits. No adenopathy. Postoperative change in the proximal left humerus is noted. IMPRESSION: Stable interstitial thickening and postoperative change on the right. No frank edema or consolidation.  Stable cardiac silhouette. No pneumothorax. Electronically Signed   By: Lowella Grip III M.D.   On: 04/29/2017 07:53   US Abdomen Limited Ruq  Result Date: 04/28/2017 CLINICAL DATA:  Elevated bilirubin and history of cholangiocarcinoma EXAM: ULTRASOUND ABDOMEN LIMITED RIGHT UPPER QUADRANT COMPARISON:  04/18/2017 FINDINGS: Gallbladder: Gallbladder is decompressed.  No definitive gallstones are seen. Common bile duct: Diameter: Biliary catheters are noted within the common bile duct. Accurate measurement of the common bile duct is unable to be performed. Liver: Stents are noted extending through the left and right lobes into the common bile duct. The known area of abnormal attenuation on recent CT is not well appreciated on this exam. No biliary ductal dilatation is seen. Portal vein is patent on color Doppler imaging with normal direction of blood flow towards the liver. IMPRESSION: Changes consistent with bilateral biliary drainage catheters Decompressed gallbladder. No other focal abnormality is seen. Electronically Signed   By: Inez Catalina M.D.   On: 04/28/2017 10:04      Impression / Plan:   MAKENZYE TROUTMAN is a 77 y.o. female with history of ulcerative colitis not on any medications, lupus and sarcoidosis, metastatic cholangiocarcinoma on gemcitabine with partial response, external/internal biliary drains with recent exchange on 04/22/2017 directly admitted from oncology clinic secondary to possible sepsis and hyperbilirubinemia. Her bilirubin level started rising after drain exchange only. Moreover, she has predominantly direct bilirubin level elevated which is  concerning for biliary drain malfunction or probable sepsis after recent biliary drain exchange which may have introduced some bacteria.   - Continue antibiotics per ID recommendations - Monitor LFTs, if not improving with antibiotics recommend IR consult for evaluation of the biliary drains - From UC standpoint, she is currently clinically asymptomatic, but she does have endoscopic moderate right-sided colitis based on pathology, we can defer treatment for now. Recommend follow-up with Dr. Vira Agar after discharge for further management. I wonder if she has background [place] which may have resulted in cholangiocarcinoma. She did not have liver biopsy pursued in the past. Nonetheless, it won't change the management at this time.   Thank you for involving me in the care of this patient. Will follow along with you Dr. Vicente Males to cover for the weekend    LOS: 2 days   Sherri Sear, MD  04/29/2017, 7:47 PM   Note: This dictation was prepared with Dragon dictation along with smaller phrase technology. Any transcriptional errors that result from this process are unintentional.

## 2017-04-30 LAB — COMPREHENSIVE METABOLIC PANEL
ALBUMIN: 2.4 g/dL — AB (ref 3.5–5.0)
ALT: 42 U/L (ref 14–54)
AST: 53 U/L — AB (ref 15–41)
Alkaline Phosphatase: 240 U/L — ABNORMAL HIGH (ref 38–126)
Anion gap: 6 (ref 5–15)
BILIRUBIN TOTAL: 2.5 mg/dL — AB (ref 0.3–1.2)
BUN: 14 mg/dL (ref 6–20)
CHLORIDE: 101 mmol/L (ref 101–111)
CO2: 29 mmol/L (ref 22–32)
CREATININE: 0.64 mg/dL (ref 0.44–1.00)
Calcium: 8 mg/dL — ABNORMAL LOW (ref 8.9–10.3)
GFR calc Af Amer: >60 mL/min (ref >60)
GFR calc non Af Amer: >60 mL/min (ref >60)
GLUCOSE: 80 mg/dL (ref 65–99)
Potassium: 2.7 mmol/L — CL (ref 3.5–5.1)
Sodium: 136 mmol/L (ref 135–145)
TOTAL PROTEIN: 5.9 g/dL — AB (ref 6.5–8.1)

## 2017-04-30 LAB — CBC
HEMATOCRIT: 25.9 % — AB (ref 35.0–47.0)
Hemoglobin: 8.4 g/dL — ABNORMAL LOW (ref 12.0–16.0)
MCH: 30.8 pg (ref 26.0–34.0)
MCHC: 32.6 g/dL (ref 32.0–36.0)
MCV: 94.5 fL (ref 80.0–100.0)
Platelets: 180 10*3/uL (ref 150–440)
RBC: 2.74 MIL/uL — AB (ref 3.80–5.20)
RDW: 16.5 % — AB (ref 11.5–14.5)
WBC: 6.7 10*3/uL (ref 3.6–11.0)

## 2017-04-30 LAB — PROTIME-INR
INR: 0.91
Prothrombin Time: 12.2 seconds (ref 11.4–15.2)

## 2017-04-30 LAB — GLUCOSE, CAPILLARY
GLUCOSE-CAPILLARY: 74 mg/dL (ref 65–99)
GLUCOSE-CAPILLARY: 176 mg/dL — AB (ref 65–99)
Glucose-Capillary: 140 mg/dL — ABNORMAL HIGH (ref 65–99)

## 2017-04-30 LAB — APTT: APTT: 43 s — AB (ref 24–36)

## 2017-04-30 LAB — POTASSIUM: POTASSIUM: 3.1 mmol/L — AB (ref 3.5–5.1)

## 2017-04-30 LAB — MAGNESIUM: Magnesium: 1.8 mg/dL (ref 1.7–2.4)

## 2017-04-30 MED ORDER — POTASSIUM CHLORIDE CRYS ER 20 MEQ PO TBCR
40.0000 meq | EXTENDED_RELEASE_TABLET | ORAL | Status: AC
  Administered 2017-04-30 (×2): 40 meq via ORAL
  Filled 2017-04-30 (×3): qty 2

## 2017-04-30 MED ORDER — AMOXICILLIN-POT CLAVULANATE 875-125 MG PO TABS
1.0000 | ORAL_TABLET | Freq: Two times a day (BID) | ORAL | Status: DC
  Administered 2017-04-30: 1 via ORAL
  Filled 2017-04-30: qty 1

## 2017-04-30 MED ORDER — METOPROLOL TARTRATE 25 MG PO TABS: 25.0000 mg | ORAL_TABLET | Freq: Two times a day (BID) | ORAL | 0 refills | Status: DC

## 2017-04-30 MED ORDER — POTASSIUM CHLORIDE 20 MEQ PO PACK: 40.0000 meq | PACK | Freq: Once | ORAL | Status: DC

## 2017-04-30 MED ORDER — DOCUSATE SODIUM 100 MG PO CAPS: 100.0000 mg | ORAL_CAPSULE | Freq: Two times a day (BID) | ORAL | 0 refills | Status: DC | PRN

## 2017-04-30 MED ORDER — AMOXICILLIN-POT CLAVULANATE 875-125 MG PO TABS: 1.0000 | ORAL_TABLET | Freq: Two times a day (BID) | ORAL | 0 refills | Status: DC

## 2017-04-30 MED ORDER — AMLODIPINE BESYLATE 10 MG PO TABS: 10.0000 mg | ORAL_TABLET | Freq: Every day | ORAL | 0 refills | Status: DC

## 2017-04-30 MED ORDER — MAGNESIUM SULFATE IN D5W 1-5 GM/100ML-% IV SOLN
1.0000 g | Freq: Once | INTRAVENOUS | Status: AC
  Administered 2017-04-30: 1 g via INTRAVENOUS
  Filled 2017-04-30 (×2): qty 100

## 2017-04-30 MED ORDER — POTASSIUM CHLORIDE 10 MEQ/100ML IV SOLN
10.0000 meq | INTRAVENOUS | Status: AC
  Administered 2017-04-30: 14:00:00 10 meq via INTRAVENOUS
  Filled 2017-04-30 (×2): qty 100

## 2017-04-30 NOTE — Progress Notes (Signed)
Patient discharged with son. Discharge instructions given, verbalized understanding. IV removed and port, patient with no complaints.

## 2017-04-30 NOTE — Discharge Summary (Addendum)
[facility] NAME: Regina Hood    MR#:  [id number]  DATE OF BIRTH:  Aug 14, 1940  DATE OF ADMISSION:  04/27/2017 ADMITTING PHYSICIAN: Vaughan Basta, MD  DATE OF DISCHARGE: 04/30/17  PRIMARY CARE PHYSICIAN: Idelle Crouch, MD    ADMISSION DIAGNOSIS:  elevated lytic acids  DISCHARGE DIAGNOSIS:  Principal Problem:   Sepsis ([place]) Active Problems:   Hyperbilirubinemia   Hypomagnesemia HYPOKALEMIA  SECONDARY DIAGNOSIS:   Past Medical History:  Diagnosis Date  . Arthritis   . Cancer ([place])   . Cellulitis   . Collagen vascular disease ([place])   . COPD (chronic obstructive pulmonary disease) ([place])   . DDD (degenerative disc disease), lumbar   . Depression   . Diabetes mellitus   . Diverticulitis   . GERD (gastroesophageal reflux disease)   . GI bleed   . Headache(784.0)   . Hypertension   . Lupus   . Ulcerative colitis [facility])     HOSPITAL COURSE:   HISTORY OF PRESENT ILLNESS: Regina Hood  is a 77 y.o. female with a known history of Cholangiocarcinoma, DM, Htn- had biliary drainage tube placed at [place] last month and due to leakage- changed last week. The tubes are internally drained and pt just need to flush it daily. Went to Cancer center for check up, abd is distending. No fever/ pain. Noted to have high lactic acid and tachycardia. So suspected sepsis by oncologist. Her WBCs not high , but she is on chemo. Given Vanc+ Zosyn IV and fluids. Sent for direct admit.   * Sepsis present on admission Continued vanco and zosyn but  culture neg and clinically improving stopped vanco per ID  - can dc on oral augmentin as clinically improving white count is in the normal range and no Fever.  Blood cultures are performed on 04/27/2017 which are neg for 3 days  * Biliary cancer S/p drainage tube at [facility], internal drainage, just flush daily Okay to discharge patient from oncology standpoint  * Hypokalemia - repleted  PO and IV   * DM RESUME metformin  ISS given during hospital course  * HTN Cont home meds  * High bilirubin: (Lab Abnormality?) - unknown etiology. US abdomenand CT abdomen pelvis NEG. There is no obstruction or progression of disease.   Bilirubin is trending down today is at 2.5 - Biliary tubes draining ok - requested GI c/s per Onco request and d/w Dr Marius Ditch, per GI  from UC standpoint, she is currently clinically asymptomatic, but she does have endoscopic moderate right-sided colitis based on pathology, we can defer treatment for now. Recommend follow-up with Dr. Vira Agar after discharge for further management   Physical therapy is recommended home health PT but patient is refusing     DISCHARGE CONDITIONS:   Stable   CONSULTS OBTAINED:  Treatment Team:  Lloyd Huger, MD Leonel Ramsay, MD Lin Landsman, MD   PROCEDURES  None   DRUG ALLERGIES:   Allergies  Allergen Reactions  . Feldene [Piroxicam]   . Penicillamine Other (See Comments)  . Qualaquin [Quinine]     DISCHARGE MEDICATIONS:   Allergies as of 04/30/2017      Reactions   Feldene [piroxicam]    Penicillamine Other (See Comments)   Qualaquin [quinine]       Medication List    TAKE these medications   acetaminophen 500 MG tablet Commonly known as:  TYLENOL Take 1,000 mg by mouth 3 (three) times  daily.   ADVAIR DISKUS 250-50 MCG/DOSE Aepb Generic drug:  Fluticasone-Salmeterol Inhale into the lungs.   amLODipine 10 MG tablet Commonly known as:  NORVASC Take 1 tablet (10 mg total) by mouth daily. What changed:    medication strength  how much to take   amoxicillin-clavulanate 875-125 MG tablet Commonly known as:  AUGMENTIN Take 1 tablet by mouth every 12 (twelve) hours.   calcium carbonate 100 mg/ml Susp Take by mouth.   dexamethasone 1 MG tablet Commonly known as:  DECADRON Take 2 tablets (2 mg total) by mouth daily with breakfast.   docusate sodium  100 MG capsule Commonly known as:  COLACE Take 1 capsule (100 mg total) by mouth 2 (two) times daily as needed for mild constipation.   estradiol 1 MG tablet Commonly known as:  ESTRACE Take 1 mg by mouth daily.   gabapentin 100 MG capsule Commonly known as:  NEURONTIN Take 100 mg by mouth 3 (three) times daily.   lidocaine-prilocaine cream Commonly known as:  EMLA Apply 1 application topically as needed.   LORazepam 0.5 MG tablet Commonly known as:  ATIVAN Take 0.5 mg every 6 (six) hours as needed by mouth.   Medical Compression Stockings Misc 2 each by Does not apply route daily.   metFORMIN 500 MG tablet Commonly known as:  GLUCOPHAGE Take 500 mg by mouth 2 (two) times daily with a meal.   metoprolol tartrate 25 MG tablet Commonly known as:  LOPRESSOR Take 1 tablet (25 mg total) by mouth 2 (two) times daily.   Normal Saline Flush 0.9 % Soln 1 mL by Other route every 8 (eight) hours. Flush each biliary drain with 1 mL twice daily   ondansetron 8 MG tablet Commonly known as:  ZOFRAN TAKE ONE TABLET EVERY 12 HOURS AS NEEDED   oxyCODONE 5 MG immediate release tablet Commonly known as:  Oxy IR/ROXICODONE Take 1 tablet (5 mg total) by mouth every 6 (six) hours as needed.   pantoprazole 40 MG tablet Commonly known as:  PROTONIX Take 40 mg by mouth daily.   PLAQUENIL 200 MG tablet Generic drug:  hydroxychloroquine Take 200 mg by mouth 2 (two) times daily.   polyethylene glycol powder powder Commonly known as:  GLYCOLAX/MIRALAX Take 0.5 Containers by mouth.   potassium chloride SA 20 MEQ tablet Commonly known as:  K-DUR,KLOR-CON Take 1 tablet (20 mEq total) by mouth daily as needed (low potassium).   prochlorperazine 10 MG tablet Commonly known as:  COMPAZINE Take 1 tablet (10 mg total) by mouth every 6 (six) hours as needed.   senna 8.6 MG Tabs tablet Commonly known as:  SENOKOT Take 2 tablets (17.2 mg total) by mouth daily. Hold for loose stools.   sodium  chloride 0.9 % injection Flush each biliary drains (2) with 10 mL two times daily.   triamcinolone ointment 0.1 % Commonly known as:  KENALOG Apply 1 application topically 2 (two) times daily.   venlafaxine XR 75 MG 24 hr capsule Commonly known as:  EFFEXOR-XR Take 225 mg by mouth daily.   VITAMIN B-12 PO Take 1,000 mcg by mouth daily.   zolpidem 10 MG tablet Commonly known as:  AMBIEN Take 1 tablet (10 mg total) by mouth at bedtime as needed.        DISCHARGE INSTRUCTIONS:    Follow-up with primary care physician in a week;  Follow-up with gastroenterology Dr. Vira Agar in 1-2 weeks Follow-up with [facility] oncology regarding biliary cancer as recommended DIET:  Cardiac diet  and Diabetic diet  DISCHARGE CONDITION:  Stable  ACTIVITY:  Activity as tolerated  OXYGEN:  Home Oxygen:YES   Oxygen Delivery: 2 liters/min via Patient connected to nasal cannula oxygen  DISCHARGE LOCATION:  home   If you experience worsening of your admission symptoms, develop shortness of breath, life threatening emergency, suicidal or homicidal thoughts you must seek medical attention immediately by calling 911 or calling your MD immediately  if symptoms less severe.  You Must read complete instructions/literature along with all the possible adverse reactions/side effects for all the Medicines you take and that have been prescribed to you. Take any new Medicines after you have completely understood and accpet all the possible adverse reactions/side effects.   Please note  You were cared for by a hospitalist during your hospital stay. If you have any questions about your discharge medications or the care you received while you were in the hospital after you are discharged, you can call the unit and asked to speak with the hospitalist on call if the hospitalist that took care of you is not available. Once you are discharged, your primary care physician will handle any further medical issues. Please  note that NO REFILLS for any discharge medications will be authorized once you are discharged, as it is imperative that you return to your primary care physician (or establish a relationship with a primary care physician if you do not have one) for your aftercare needs so that they can reassess your need for medications and monitor your lab values.     Today  No chief complaint on file.   Patient is feeling fine.  Desperately wants to go home,  denies any complaints ,biliary drains are functioning fell  ROS:  CONSTITUTIONAL: Denies fevers, chills. Denies any fatigue, weakness.  EYES: Denies blurry vision, double vision, eye pain. EARS, NOSE, THROAT: Denies tinnitus, ear pain, hearing loss. RESPIRATORY: Denies cough, wheeze, shortness of breath.  CARDIOVASCULAR: Denies chest pain, palpitations, edema.  GASTROINTESTINAL: Denies nausea, vomiting, diarrhea, abdominal pain. Denies bright red blood per rectum. GENITOURINARY: Denies dysuria, hematuria. ENDOCRINE: Denies nocturia or thyroid problems. HEMATOLOGIC AND LYMPHATIC: Denies easy bruising or bleeding. SKIN: Denies rash or lesion. MUSCULOSKELETAL: Denies pain in neck, back, shoulder, knees, hips or arthritic symptoms.  NEUROLOGIC: Denies paralysis, paresthesias.  PSYCHIATRIC: Denies anxiety or depressive symptoms.   VITAL SIGNS:  Blood pressure (!) 145/64, pulse 98, temperature 98.4 F (36.9 C), temperature source Oral, resp. rate 16, height 5' 2"  (1.575 m), weight 72 kg (158 lb 11.2 oz), SpO2 93 %.  I/O:    Intake/Output Summary (Last 24 hours) at 04/30/2017 1544 Last data filed at 04/30/2017 1435 Gross per 24 hour  Intake 960 ml  Output -  Net 960 ml    PHYSICAL EXAMINATION:  GENERAL:  77 y.o.-year-old patient lying in the bed with no acute distress.  EYES: Pupils equal, round, reactive to light and accommodation. No scleral icterus. Extraocular muscles intact.  HEENT: Head atraumatic, normocephalic. Oropharynx and  nasopharynx clear.  NECK:  Supple, no jugular venous distention. No thyroid enlargement, no tenderness.  LUNGS: Normal breath sounds bilaterally, no wheezing, rales,rhonchi or crepitation. No use of accessory muscles of respiration.  CARDIOVASCULAR: S1, S2 normal. No murmurs, rubs, or gallops.  ABDOMEN: Soft, non-tender, non-distended. Bowel sounds present. No organomegaly or mass.  EXTREMITIES: No pedal edema, cyanosis, or clubbing.  NEUROLOGIC: Cranial nerves II through XII are intact. Muscle strength 5/5 in all extremities. Sensation intact. Gait not checked.  PSYCHIATRIC: The patient is  alert and oriented x 3.  SKIN: No obvious rash, lesion, or ulcer.   DATA REVIEW:   CBC Recent Labs  Lab 04/30/17 0518  WBC 6.7  HGB 8.4*  HCT 25.9*  PLT 180    Chemistries  Recent Labs  Lab 04/30/17 0518  NA 136  K 2.7*  CL 101  CO2 29  GLUCOSE 80  BUN 14  CREATININE 0.64  CALCIUM 8.0*  MG 1.8  AST 53*  ALT 42  ALKPHOS 240*  BILITOT 2.5*    Cardiac Enzymes No results for input(s): TROPONINI in the last 168 hours.  Microbiology Results  Results for orders placed or performed in visit on 04/27/17  Culture, blood (routine x 2)     Status: None (Preliminary result)   Collection Time: 04/27/17 11:37 AM  Result Value Ref Range Status   Specimen Description   Final    BLOOD RIGHT HAND Performed at [facility], [address] [place], [place] [address]    Special Requests   Final    BOTTLES DRAWN AEROBIC AND ANAEROBIC Blood Culture adequate volume Performed at [facility], [address], [place], [place] [address]    Culture   Final    NO GROWTH 3 DAYS Performed at [facility], [place] [address] [place], [place] [address]    Report Status PENDING  Incomplete  Culture, blood (routine x 2)     Status: None (Preliminary result)   Collection Time: 04/27/17 11:49 AM  Result Value Ref Range Status   Specimen Description   Final    BLOOD LEFT  HAND Performed at [facility], [address] [place], [place] [address]    Special Requests   Final    BOTTLES DRAWN AEROBIC AND ANAEROBIC Blood Culture adequate volume Performed at [facility], [address] [place], [place] [address]    Culture   Final    NO GROWTH 3 DAYS Performed at [facility], [place] [address], [place], [place] [address]    Report Status PENDING  Incomplete    RADIOLOGY:  Dg Chest 2 View  Result Date: 04/27/2017 CLINICAL DATA:  Several years of shortness of breath with onset of weakness several months ago. History of hepatic malignancy on chemotherapy. Also history of COPD, former smoker, diabetes, and lupus. EXAM: CHEST  2 VIEW COMPARISON:  Chest x-ray and chest CT scan of March 16, 2017 FINDINGS: The lungs are adequately inflated. The interstitial markings are coarse but are not as conspicuous as on the previous study. The heart is normal in size. The pulmonary vascularity is not clearly engorged. There is no pleural effusion. The porta catheter tip projects over the midportion of the SVC. The mediastinum is normal in width. The bony thorax exhibits no acute abnormality. Drainage catheters are present in the upper abdomen. IMPRESSION: Chronic interstitial changes throughout both lungs less conspicuous than on the previous study. No definite acute pneumonia nor CHF. No findings suspicious for metastatic disease. Electronically Signed   By: David  Martinique M.D.   On: 04/27/2017 10:04   Ct Abdomen Pelvis W Contrast  Result Date: 04/29/2017 CLINICAL DATA:  Abdominal distention. Lactic acidosis. History of metastatic cholangiocarcinoma. EXAM: CT ABDOMEN AND PELVIS WITH CONTRAST TECHNIQUE: Multidetector CT imaging of the abdomen and pelvis was performed using the standard protocol following bolus administration of intravenous contrast. CONTRAST:  178m ISOVUE-300 IOPAMIDOL (ISOVUE-300) INJECTION 61% COMPARISON:  04/18/2017 FINDINGS: Lower chest:  Bilateral calcified pleural plaques. Small lymph nodes  in the retrocrural space bilaterally are similar to prior. Chronic interstitial changes are noted in the lung bases bilaterally. Hepatobiliary: Previously described hypoenhancing lesion and segment III of the liver is similar measuring 2.5 x 2.1 cm today compared to 2.8 x 2.5 cm previously. Atrophy of the lateral segment left liver again noted. No focal abnormality in the right liver. 2 internal external biliary drains are evident with distal loop formed in the duodenal lumen. Gallbladder is decompressed with gas in the lumen compatible with the presence of the biliary drains. Pancreas: No focal mass lesion. No dilatation of the main duct. No intraparenchymal cyst. No peripancreatic edema. Spleen: No splenomegaly. No focal mass lesion. Adrenals/Urinary Tract: No adrenal nodule or mass. Kidneys are unremarkable. No evidence for hydroureter. The urinary bladder appears normal for the degree of distention. Stomach/Bowel: Stomach is nondistended. No gastric wall thickening. No evidence of outlet obstruction. Duodenum is normally positioned as is the ligament of Treitz. No small bowel wall thickening. No small bowel dilatation. The terminal ileum is normal. The appendix is normal. Diverticular changes are noted in the left colon without evidence of diverticulitis. Vascular/Lymphatic: There is abdominal aortic atherosclerosis without aneurysm. Scattered small lymph nodes in the upper abdomen are stable in the interval. No pelvic sidewall lymphadenopathy. Reproductive: Uterus surgically absent. Similar appearance small follicle left ovary. Other: No intraperitoneal free fluid. Musculoskeletal: Bone windows reveal no worrisome lytic or sclerotic osseous lesions. IMPRESSION: 1. Stable exam.  No new or progressive findings. 2. Approximately 3 cm hypoenhancing lesion in the left liver is similar to prior. 3. Stable appearance of the 2 biliary drains. 4. Similar appearance  retrocrural and upper abdominal small lymph nodes. 5.  Aortic Atherosclerois (ICD10-170.0) Electronically Signed   By: Misty Stanley M.D.   On: 04/29/2017 14:45   Dg Chest Port 1 View  Result Date: 04/29/2017 CLINICAL DATA:  Shortness of breath with wheezing EXAM: PORTABLE CHEST 1 VIEW COMPARISON:  April 27, 2016 FINDINGS: Port-A-Cath tip is in the superior vena cava.  No pneumothorax. Postoperative changes noted on the right. There is interstitial thickening bilaterally without frank edema or consolidation. Heart is upper normal in size with pulmonary vascular within normal limits. No adenopathy. Postoperative change in the proximal left humerus is noted. IMPRESSION: Stable interstitial thickening and postoperative change on the right. No frank edema or consolidation. Stable cardiac silhouette. No pneumothorax. Electronically Signed   By: Lowella Grip III M.D.   On: 04/29/2017 07:53   US Abdomen Limited Ruq  Result Date: 04/28/2017 CLINICAL DATA:  Elevated bilirubin and history of cholangiocarcinoma EXAM: ULTRASOUND ABDOMEN LIMITED RIGHT UPPER QUADRANT COMPARISON:  04/18/2017 FINDINGS: Gallbladder: Gallbladder is decompressed.  No definitive gallstones are seen. Common bile duct: Diameter: Biliary catheters are noted within the common bile duct. Accurate measurement of the common bile duct is unable to be performed. Liver: Stents are noted extending through the left and right lobes into the common bile duct. The known area of abnormal attenuation on recent CT is not well appreciated on this exam. No biliary ductal dilatation is seen. Portal vein is patent on color Doppler imaging with normal direction of blood flow towards the liver. IMPRESSION: Changes consistent with bilateral biliary drainage catheters Decompressed gallbladder. No other focal abnormality is seen. Electronically Signed   By: Inez Catalina M.D.   On: 04/28/2017 10:04    EKG:   Orders placed or performed during the hospital  encounter of 03/16/17  . ED EKG  . ED EKG  . EKG  Management plans discussed with the patient, family and they are in agreement.  CODE STATUS:     Code Status Orders  (From admission, onward)        Start     Ordered   04/27/17 1826  Do not attempt resuscitation (DNR)  Continuous    Question Answer Comment  In the event of cardiac or respiratory ARREST Do not call a "code blue"   In the event of cardiac or respiratory ARREST Do not perform Intubation, CPR, defibrillation or ACLS   In the event of cardiac or respiratory ARREST Use medication by any route, position, wound care, and other measures to relive pain and suffering. May use oxygen, suction and manual treatment of airway obstruction as needed for comfort.      04/27/17 1826    Code Status History    Date Active Date Inactive Code Status Order ID Comments User Context   03/16/2017 17:12 03/17/2017 16:08 DNR [id number]  Henreitta Leber, MD Inpatient   11/13/2016 03:01 11/14/2016 14:12 DNR [id number]  Harrie Foreman, MD Inpatient   06/29/2016 15:33 07/01/2016 19:22 DNR [id number]  Hillary Bow, MD ED   09/17/2011 14:43 09/19/2011 16:24 Full Code [id number]  Corum, Jeani Sow, RN Inpatient    Advance Directive Documentation     Most Recent Value  Type of Advance Directive  Living will  Pre-existing out of facility DNR order (yellow form or pink MOST form)  No data  "MOST" Form in Place?  No data      TOTAL TIME TAKING CARE OF THIS PATIENT: 45 minutes.   Note: This dictation was prepared with Dragon dictation along with smaller phrase technology. Any transcriptional errors that result from this process are unintentional.   @[facility] @  on 04/30/2017 at 3:44 PM  Between 7am to 6pm - Pager - [phone]  After 6pm go to [email or web address] - password [email or web address] [facility]  Office  [phone]  CC: Primary care physician; Idelle Crouch, MD

## 2017-04-30 NOTE — Progress Notes (Signed)
Pharmacy Electrolyte Monitoring Consult:  Pharmacy consulted to assist in monitoring and replacing electrolytes in this 77 y.o. female admitted on 04/27/2017 with No chief complaint on file. Sepsis/ biliary Cancer, hypokalemia  Labs:  Sodium (mmol/L)  Date Value  04/30/2017 136   Potassium (mmol/L)  Date Value  04/30/2017 3.1 (L)   Magnesium (mg/dL)  Date Value  04/30/2017 1.8   Phosphorus (mg/dL)  Date Value  04/28/2017 2.3 (L)   Calcium (mg/dL)  Date Value  04/30/2017 8.0 (L)   Albumin (g/dL)  Date Value  04/30/2017 2.4 (L)    Plan: 1/12 1541 K+: 3.1. Will order KCL 80mq po X 1 dose.  Recheck all electrolytes with AM labs.   SPernell Dupre PharmD, BCPS Clinical Pharmacist 04/30/2017 6:43 PM

## 2017-04-30 NOTE — Progress Notes (Addendum)
Pharmacy Electrolyte Monitoring Consult:  Pharmacy consulted to assist in monitoring and replacing electrolytes in this 77 y.o. female admitted on 04/27/2017 with No chief complaint on file. Sepsis/ biliary Cancer, hypokalemia  Labs:  Sodium (mmol/L)  Date Value  04/30/2017 136   Potassium (mmol/L)  Date Value  04/30/2017 2.7 (LL)   Magnesium (mg/dL)  Date Value  04/28/2017 1.3 (L)   Phosphorus (mg/dL)  Date Value  04/28/2017 2.3 (L)   Calcium (mg/dL)  Date Value  04/30/2017 8.0 (L)   Albumin (g/dL)  Date Value  04/30/2017 2.4 (L)    Plan: K 2.7,  Mag 1.8 KCL 40 meq PO q4h x 2 ordered by MD. Will order Magnesium sulfate 1 gram IV x 1. Will f/u K at 1800 and electrolytes in am.  *Addendum: MD ordered KCL IV 10 meq IV x 3.   Elecia Serafin A 04/30/2017 7:56 AM

## 2017-04-30 NOTE — Plan of Care (Signed)
  Progressing Education: Knowledge of General Education information will improve 04/30/2017 0651 - Progressing by Sonda Primes, RN Health Behavior/Discharge Planning: Ability to manage health-related needs will improve 04/30/2017 0651 - Progressing by Sonda Primes, RN Clinical Measurements: Ability to maintain clinical measurements within normal limits will improve 04/30/2017 0651 - Progressing by Sonda Primes, RN Will remain free from infection 04/30/2017 0651 - Progressing by Sonda Primes, RN Diagnostic test results will improve 04/30/2017 0651 - Progressing by Sonda Primes, RN Respiratory complications will improve 04/30/2017 0651 - Progressing by Sonda Primes, RN Cardiovascular complication will be avoided 04/30/2017 0651 - Progressing by Sonda Primes, RN Activity: Risk for activity intolerance will decrease 04/30/2017 0651 - Progressing by Sonda Primes, RN Nutrition: Adequate nutrition will be maintained 04/30/2017 0651 - Progressing by Sonda Primes, RN Coping: Level of anxiety will decrease 04/30/2017 0651 - Progressing by Sonda Primes, RN Elimination: Will not experience complications related to bowel motility 04/30/2017 0651 - Progressing by Sonda Primes, RN Will not experience complications related to urinary retention 04/30/2017 0651 - Progressing by Sonda Primes, RN Pain Managment: General experience of comfort will improve 04/30/2017 0651 - Progressing by Sonda Primes, RN Safety: Ability to remain free from injury will improve 04/30/2017 0651 - Progressing by Sonda Primes, RN Skin Integrity: Risk for impaired skin integrity will decrease 04/30/2017 0651 - Progressing by Sonda Primes, RN Fluid Volume: Hemodynamic stability will improve 04/30/2017 0651 - Progressing by Sonda Primes, RN Clinical Measurements: Diagnostic test results will improve 04/30/2017 0651 - Progressing by Sonda Primes, RN Signs and symptoms of infection will decrease 04/30/2017 0651 - Progressing by Sonda Primes, RN

## 2017-04-30 NOTE — Discharge Instructions (Signed)
°  Follow-up with primary care physician in a week; to follow-up on the pending blood culture results from April 27, 2017 Follow-up with gastroenterology Dr. Vira Agar in 1-2 weeks Follow-up with [facility] oncology regarding biliary cancer as recommended

## 2017-04-30 NOTE — Progress Notes (Signed)
CRITICAL VALUE ALERT  Critical Value:  K 2.7  Date & Time Notied:  04/30/17 @ 1947  Provider Notified:   Orders Received/Actions taken:

## 2017-04-30 NOTE — Evaluation (Signed)
Physical Therapy Evaluation Patient Details Name: Regina Hood MRN: [id number] DOB: 09/06/1940 Today's Date: 04/30/2017   History of Present Illness  Pt is a76 y.o.femalewith a known history of Cholangiocarcinoma, DM, and HTN.  Pt had biliary drainage tube placed at [place] last month and due to leakage and changed last week. The tubes are internally drained and pt just need to flush it daily. Went to Cancer center for check up, abd is distending. No fever/ pain.  Noted to have high lactic acid and tachycardia with suspected sepsis by oncologist. Her WBCs are not high but she is on chemo.  Pt given Vanc+ Zosyn IV and fluids and sent for direct admit.  Assessment includes sepsis, hyperbilirubinemia, hypomagnesemia, and hypokalemia.     Clinical Impression  Pt's last recorded Ka 2.7 with Dr. Margaretmary Eddy giving verbal orders for clearance for pt to participate with PT services secondary to pt receiving earlier oral and and then IV Ka supplementation.  Pt presents with mild deficits in strength, gait, balance, and activity tolerance.  Pt Ind with bed mobility and transfers and was able to amb 125' without AD with SpO2 and HR WNL on 2LO2/min.  No adverse symptoms noted during session other than general mild fatigue.  Pt reports feeling strength/endurance "maybe 80% of normal".  Pt will benefit from HHPT services upon discharge to safely address above deficits and to establish a safe routine of exercise and activity progression to help prevent further declines in strength and endurance associated with cancer treatment.       Follow Up Recommendations Home health PT    Equipment Recommendations  None recommended by PT    Recommendations for Other Services       Precautions / Restrictions Precautions Precautions: None Precaution Comments: Watch biliary drains with gait belt Restrictions Weight Bearing Restrictions: No      Mobility  Bed Mobility Overal bed mobility: Independent                 Transfers Overall transfer level: Independent Equipment used: None             General transfer comment: Good control and stability during transfers  Ambulation/Gait Ambulation/Gait assistance: Independent Ambulation Distance (Feet): 125 Feet Assistive device: None Gait Pattern/deviations: Step-through pattern;Decreased step length - right;Decreased step length - left   Gait velocity interpretation: Below normal speed for age/gender General Gait Details: Slow cadence with amb without AD but steady without LOB; decreased activity tolerance limiting amb distance but SpO2 and HR on 2LO2/min WNL during session.   Stairs            Wheelchair Mobility    Modified Rankin (Stroke Patients Only)       Balance Overall balance assessment: Needs assistance Sitting-balance support: Feet unsupported;Feet supported;No upper extremity supported Sitting balance-Leahy Scale: Normal     Standing balance support: No upper extremity supported Standing balance-Leahy Scale: Good Standing balance comment: Max SLS time 2-3 sec on both LEs but steady during amb and functional tasks without UE support                             Pertinent Vitals/Pain Pain Assessment: No/denies pain    Home Living Family/patient expects to be discharged to:: Private residence Living Arrangements: Alone   Type of Home: House Home Access: Stairs to enter Entrance Stairs-Rails: Right;Left;Can reach both Entrance Stairs-Number of Steps: 4 Home Layout: One level Home Equipment: None  Prior Function Level of Independence: Independent         Comments: Pt Ind with amb without AD, Ind with ADLs, no fall history     Hand Dominance   Dominant Hand: Right    Extremity/Trunk Assessment   Upper Extremity Assessment Upper Extremity Assessment: Overall WFL for tasks assessed    Lower Extremity Assessment Lower Extremity Assessment: Generalized weakness       Communication    Communication: No difficulties  Cognition Arousal/Alertness: Awake/alert Behavior During Therapy: WFL for tasks assessed/performed Overall Cognitive Status: Within Functional Limits for tasks assessed                                        General Comments      Exercises Total Joint Exercises Ankle Circles/Pumps: AROM;Both;10 reps Long Arc Quad: AROM;Both;10 reps;15 reps Knee Flexion: AROM;Both;10 reps;15 reps Marching in Standing: AROM;Both;10 reps Other Exercises Other Exercises: Static standing balance training without UE support   Assessment/Plan    PT Assessment Patient needs continued PT services  PT Problem List Decreased strength;Decreased activity tolerance;Decreased balance       PT Treatment Interventions Gait training;Stair training;Functional mobility training;Neuromuscular re-education;Balance training;Therapeutic exercise;Therapeutic activities;Patient/family education    PT Goals (Current goals can be found in the Care Plan section)  Acute Rehab PT Goals Patient Stated Goal: Improved strength and edurance PT Goal Formulation: With patient Time For Goal Achievement: 05/13/17 Potential to Achieve Goals: Good    Frequency Min 2X/week   Barriers to discharge        Co-evaluation               AM-PAC PT "6 Clicks" Daily Activity  Outcome Measure Difficulty turning over in bed (including adjusting bedclothes, sheets and blankets)?: None Difficulty moving from lying on back to sitting on the side of the bed? : None Difficulty sitting down on and standing up from a chair with arms (e.g., wheelchair, bedside commode, etc,.)?: None Help needed moving to and from a bed to chair (including a wheelchair)?: None Help needed walking in hospital room?: None Help needed climbing 3-5 steps with a railing? : None 6 Click Score: 24    End of Session Equipment Utilized During Treatment: Gait belt;Oxygen Activity Tolerance: Patient tolerated  treatment well Patient left: in bed;with call bell/phone within reach Nurse Communication: Mobility status PT Visit Diagnosis: Difficulty in walking, not elsewhere classified (R26.2);Muscle weakness (generalized) (M62.81)    Time: 3358-2518 PT Time Calculation (min) (ACUTE ONLY): 33 min   Charges:   PT Evaluation $PT Eval Low Complexity: 1 Low PT Treatments $Therapeutic Exercise: 8-22 mins   PT G Codes:        DRoyetta Asal PT, DPT 04/30/17, 4:15 PM

## 2017-05-02 LAB — CULTURE, BLOOD (ROUTINE X 2)
CULTURE: NO GROWTH
CULTURE: NO GROWTH
SPECIAL REQUESTS: ADEQUATE
Special Requests: ADEQUATE

## 2017-05-03 NOTE — Progress Notes (Signed)
Hematology/Oncology Follow up note [facility] Telephone:(336) [phone] [phone]  Patient Care Team: Idelle Crouch, MD as PCP - General (Internal Medicine) REASON FOR VISIT Follow up for treatment of cholangiocarcinoma  PERTINENT ONCOLOGY HISTORY Regina Hood 77 y.o.  female with PMH listed as below, most significantly for cholangiocarcinoma who has been on chemotherapy treatment presents to transfer her cancer care to our cancer center.  After extensive medical record review, summary of her diagnosis and treatment history are listed below.  1. Initially presented in 07/2016 to an outside ED with lower and upper GI bleed.  A. She was found to have new LFTs elevations including AST 340, ALT 379, Alk phos 420 and t bili 0.9.   B. [place] was obtained, which did not visualize the left lobe.  C. She was evaluated by gastroenterology because of her history of UC and underwent colonoscopy, which was unremarkable per path report.  2. 08/20/16, CT noted diffuse atrophy of the left lobe with left intrahepatic biliary ductal dilatation, with 4.1 cm ill-defined low-attenuation mass. This mass was suspicious for malignancy.  3. 08/2016, MRI/MRCP which showed abnormal left hepatic lobe with diffuse biliary dilatation with a shrunken left hepatic lobe and a 2.42.9 cm oval-shaped masslike component displacing the adjacent bile ducts. There is an approximately 1.6 cm segment truncated bile ducts involving common hepatic duct and right and left hepatic although no dilatation of the right hepatic duct system was seen.   4. 09/02/16, ERCP showed localized biliary stricture in the left intrahepatic duct with dilation of the left intrahepatic branches. Sphincterotomy was performed and a stent was placed. EUS with a 3.3 cm mass and left duct dilation.  5. Repeat ERCP was done 09/10/16, during which the stent was exchanged.  6. 09/20/16, taken to OR and noted to a have an peritoneal  implant with biopsy c/w adenocarcinoma. The neoplastic cells are positive for CK7. They are negative for CK20, CDX-2, TTF-1, Napsin A, hep-par1, glypican 3, and arginase. The immunohistochemical features are consistent with adenocarcinoma of upper gastrointestinal tract or pancreaticobiliary origin.  7. 09/28/16, initially seen in medical oncology clinic by Dr. Filomena Jungling 8. 09/29/16, admitted for biliary obstruction and cholangitis, s/p PBD placement, d/c 10/06/16 9. 10/12/16, initiated on gemcitabine (400 mg/m2). CA 19-9 167 10. 10/26/16, increased gemcitabine (800 mg/m2) 11. 11/23/2016 Cycle 3 Gemzar (8105m/m2), 12/07/2016, Gemzar (808mm2) added cisplatin 2575m2  12. 12/14/2016, CT CAP shows slight interval decrease in size of L hepatic lobe cholangio. Slight interval increase in size of porta hepatis lymph node whiel other nodes are slightly smaller. Multiple bilateral thyroid nodules. Stable mediastinal lymph nodes. CA 19-9 60 14  8/30 Cycle 4 Gemzar (800m36m) added cisplatin 20mg5m 15, 12/28/2016 Gemcitabine 800mg/56mcisplatin was hold due to anemia. S/p 1 PRBC transfusion.   [facility]   Transferred her care to [facility] [place]    She forgot her appointment on day 8 and Gemcitabine 800mg/m14md cisplatin 20mg/m215m given  Day 9 of cycle 4 (01/05/2017).        01/19/2017  Cycle 5 Day 1Gemcitabine 800mg/m2,52mplatin 20mg/m2, 76munit blood transfusion.        01/26/2017 Cycle 5 Day 8 Gemcitabine 800mg/m2, c3matin 20mg/m2   189me has been chronically on dexamethasone since she started her treatment in [facility]. She [place] on 4 mg of dexamethasone when she transition her care to me. I have weaned down to 1 mg,  however her skin rash got worse. Currently she is back on 2 mg. I'm concerned that was a long-term steroids she will develop steroid myopathy.  # She has been only be able to tolerate single agent Gemcitiabine and her last chemotherapy was 04/06/2017.  CT abdomen Pelvis  showed stable partial response.   INTERVAL HISTORY Regina Hood 77 y.o.  female with above history reviewed by me today who presents for evaluation after her recent hospitalization. She had external biliary drain exchanged at [facility] on January 4th. She was seen by me the following week and at that time she feels generalized weakness, and lab work showed lactic acidosis, and hyperbilirubinemia. She was tachycardic, afebrile. She was admitted to the hospital and given IV vancomycin and Zosyn. Her blood culture was negative. She had ultrasound liver as well as CT abdomen which both showed no obstructions. Patient subjectively feels better and was discharged on oral antibiotics on 04/30/2017. Today she presents for follow-up after hospitalization. She reports generalized weakness, and not feeling well. She is tachycardic at 121, temperature 101. Pulse ox was 91% at room air. She feels very weak and she couldn't stand up to the scale to have her weight checked. Denies any dysuria, diarrhea, chest pain. Denies abdominal pain.   Review of Systems  Constitutional: Positive for chills, fatigue and fever. Negative for appetite change and diaphoresis.  HENT:   Negative for hearing loss and nosebleeds.   Eyes: Negative for eye problems and icterus.  Respiratory: Positive for cough and shortness of breath. Negative for hemoptysis.        Use oxygen as needed at home.  Cardiovascular: Negative for chest pain and leg swelling.  Gastrointestinal: Negative for abdominal pain, blood in stool, constipation and diarrhea.  Endocrine: Negative for hot flashes.  Genitourinary: Negative for difficulty urinating, dysuria and nocturia.   Musculoskeletal: Negative for arthralgias, back pain and flank pain.  Skin: Positive for rash. Negative for itching.       Skin inflammation at the suture site of percutaneous bile ducts drain.   Neurological: Negative for dizziness and headaches.  Hematological: Negative for  adenopathy. Does not bruise/bleed easily.  Psychiatric/Behavioral: Negative for confusion and depression. The patient is not nervous/anxious.    MEDICAL HISTORY:  Past Medical History:  Diagnosis Date  . Arthritis   . Cancer ([place])   . Cellulitis   . Collagen vascular disease ([place])   . COPD (chronic obstructive pulmonary disease) ([place])   . DDD (degenerative disc disease), lumbar   . Depression   . Diabetes mellitus   . Diverticulitis   . GERD (gastroesophageal reflux disease)   . GI bleed   . Headache(784.0)   . Hypertension   . Lupus   . Ulcerative colitis ([place])     SURGICAL HISTORY: Past Surgical History:  Procedure Laterality Date  . ABDOMINAL HYSTERECTOMY    . BREAST BIOPSY Right   . COLONOSCOPY    . COLONOSCOPY WITH PROPOFOL N/A 08/18/2016   Procedure: COLONOSCOPY WITH PROPOFOL;  Surgeon: Manya Silvas, MD;  Location[facility] ENDOSCOPY;  Service: Endoscopy;  Laterality: N/A;  . ESOPHAGOGASTRODUODENOSCOPY (EGD) WITH PROPOFOL N/A 08/18/2016   Procedure: ESOPHAGOGASTRODUODENOSCOPY (EGD) WITH PROPOFOL;  Surgeon: Manya Silvas, MD;  Location: [facility] ENDOSCOPY;  Service: Endoscopy;  Laterality: N/A;  . ORIF HUMERUS FRACTURE  09/17/2011   Procedure: OPEN REDUCTION INTERNAL FIXATION (ORIF) PROXIMAL HUMERUS FRACTURE;  Surgeon: Augustin Schooling, MD;  Location: [place];  Service: Orthopedics;  Laterality: Left;  SOCIAL HISTORY: Social History   Socioeconomic History  . Marital status: Widowed    Spouse name: Not on file  . Number of children: Not on file  . Years of education: Not on file  . Highest education level: Not on file  Social Needs  . Financial resource strain: Not on file  . Food insecurity - worry: Not on file  . Food insecurity - inability: Not on file  . Transportation needs - medical: Not on file  . Transportation needs - non-medical: Not on file  Occupational History  . Not on file  Tobacco Use  . Smoking status: Former Smoker    Packs/day: 1.00    Years:  10.00    Pack years: 10.00    Types: Cigarettes    Last attempt to quit: 12/30/1998    Years since quitting: 18.3  . Smokeless tobacco: Never Used  . Tobacco comment: quit smoking in 2003  Substance and Sexual Activity  . Alcohol use: No  . Drug use: No  . Sexual activity: Not on file  Other Topics Concern  . Not on file  Social History Narrative  . Not on file    FAMILY HISTORY: Family History  Problem Relation Age of Onset  . Breast cancer Mother 24  . COPD Father   . Prostate cancer Brother     ALLERGIES:  is allergic to feldene [piroxicam]; penicillamine; and qualaquin [quinine].  MEDICATIONS:  Current Outpatient Medications  Medication Sig Dispense Refill  . acetaminophen (TYLENOL) 500 MG tablet Take 1,000 mg by mouth 3 (three) times daily.    Marland Kitchen amLODipine (NORVASC) 10 MG tablet Take 1 tablet (10 mg total) by mouth daily. 30 tablet 0  . amoxicillin-clavulanate (AUGMENTIN) 875-125 MG tablet Take 1 tablet by mouth every 12 (twelve) hours. 14 tablet 0  . calcium carbonate 100 mg/ml SUSP Take by mouth.    . Cyanocobalamin (VITAMIN B-12 PO) Take 1,000 mcg by mouth daily.    Marland Kitchen dexamethasone (DECADRON) 1 MG tablet Take 2 tablets (2 mg total) by mouth daily with breakfast. (Patient taking differently: Take 1 mg by mouth daily with breakfast. ) 60 tablet 0  . Elastic Bandages & Supports (MEDICAL COMPRESSION STOCKINGS) MISC 2 each by Does not apply route daily. 2 each 1  . estradiol (ESTRACE) 1 MG tablet Take 1 mg by mouth daily.    . Fluticasone-Salmeterol (ADVAIR DISKUS) 250-50 MCG/DOSE AEPB Inhale into the lungs.    . gabapentin (NEURONTIN) 100 MG capsule Take 100 mg by mouth 3 (three) times daily.     . hydroxychloroquine (PLAQUENIL) 200 MG tablet Take 200 mg by mouth 2 (two) times daily.     Marland Kitchen lidocaine-prilocaine (EMLA) cream Apply 1 application topically as needed. 30 g 3  . LORazepam (ATIVAN) 0.5 MG tablet Take 0.5 mg every 6 (six) hours as needed by mouth.  0  .  metFORMIN (GLUCOPHAGE) 500 MG tablet Take 500 mg by mouth 2 (two) times daily with a meal.    . metoprolol tartrate (LOPRESSOR) 25 MG tablet Take 1 tablet (25 mg total) by mouth 2 (two) times daily. 60 tablet 0  . ondansetron (ZOFRAN) 8 MG tablet TAKE ONE TABLET EVERY 12 HOURS AS NEEDED 30 tablet 3  . oxyCODONE (OXY IR/ROXICODONE) 5 MG immediate release tablet Take 1 tablet (5 mg total) by mouth every 6 (six) hours as needed. 120 tablet 0  . pantoprazole (PROTONIX) 40 MG tablet Take 40 mg by mouth daily.    . polyethylene  glycol powder (GLYCOLAX/MIRALAX) powder Take 0.5 Containers by mouth.     . potassium chloride SA (K-DUR,KLOR-CON) 20 MEQ tablet Take 1 tablet (20 mEq total) by mouth daily as needed (low potassium). 30 tablet 0  . prochlorperazine (COMPAZINE) 10 MG tablet Take 1 tablet (10 mg total) by mouth every 6 (six) hours as needed. 30 tablet 3  . senna (SENOKOT) 8.6 MG TABS tablet Take 2 tablets (17.2 mg total) by mouth daily. Hold for loose stools. 120 each 3  . sodium chloride 0.9 % injection Flush each biliary drains (2) with 10 mL two times daily.    . Sodium Chloride Flush (NORMAL SALINE FLUSH) 0.9 % SOLN 1 mL by Other route every 8 (eight) hours. Flush each biliary drain with 1 mL twice daily 84 Syringe 3  . triamcinolone ointment (KENALOG) 0.1 % Apply 1 application topically 2 (two) times daily. 80 g 0  . venlafaxine XR (EFFEXOR-XR) 75 MG 24 hr capsule Take 225 mg by mouth daily.     Marland Kitchen zolpidem (AMBIEN) 10 MG tablet Take 1 tablet (10 mg total) by mouth at bedtime as needed. 30 tablet 3   No current facility-administered medications for this visit.    Facility-Administered Medications Ordered in Other Visits  Medication Dose Route Frequency Provider Last Rate Last Dose  . 0.9 %  sodium chloride infusion   Intravenous PRN Earlie Server, MD   Stopped at 04/27/17 1400  . 0.9 %  sodium chloride infusion   Intravenous Once Verlon Au, NP      . 0.9 %  sodium chloride infusion    Intravenous Continuous Earlie Server, MD 10 mL/hr at 05/04/17 1215    . heparin lock flush 100 unit/mL  500 Units Intravenous Once Earlie Server, MD      . potassium chloride 20 mEq in 100 mL IVPB  20 mEq Intravenous Once Earlie Server, MD 100 mL/hr at 05/04/17 1215 20 mEq at 05/04/17 1215  . vancomycin (VANCOCIN) IVPB 1000 mg/200 mL premix  1,000 mg Intravenous Once Earlie Server, MD          .  PHYSICAL EXAMINATION: ECOG PERFORMANCE STATUS: 3 - Symptomatic, >50% confined to bed Vitals:   05/04/17 1108 05/04/17 1122  BP: 137/76   Pulse: (!) 121   Resp: (!) 22   Temp: (!) 101 F (38.3 C)   SpO2:  91%   Filed Weights   Physical Exam  Constitutional: She is oriented to person, place, and time and well-developed, well-nourished, and in no distress. No distress.  HENT:  Head: Normocephalic and atraumatic.  Mouth/Throat: Oropharynx is clear and moist.  Eyes: EOM are normal. Pupils are equal, round, and reactive to light. Scleral icterus is present.  Neck: Normal range of motion. Neck supple.  Cardiovascular: Regular rhythm and normal heart sounds.  Tachycardia  Pulmonary/Chest: Effort normal. No respiratory distress. She has no wheezes.  Abdominal: Soft. Bowel sounds are normal. She exhibits no distension.  Musculoskeletal: Normal range of motion. She exhibits no edema.  trace edema.  Lymphadenopathy:    She has no cervical adenopathy.  Neurological: She is oriented to person, place, and time. No cranial nerve deficit.  Skin: Skin is dry. She is not diaphoretic.  Chronic cutaneous lupus Rash has improved.   Psychiatric: Affect and judgment normal.   .    LABORATORY DATA:  I have reviewed the data as listed Lab Results  Component Value Date   WBC 6.7 04/30/2017   HGB 8.4 (L) 04/30/2017  HCT 25.9 (L) 04/30/2017   MCV 94.5 04/30/2017   PLT 180 04/30/2017   Recent Labs    04/29/17 0320 04/30/17 0518 04/30/17 1541 05/04/17 1049  NA 138 136  --  132*  K 3.4* 2.7* 3.1* 2.9*  CL 101  101  --  94*  CO2 28 29  --  27  GLUCOSE 87 80  --  167*  BUN 13 14  --  10  CREATININE 0.62 0.64  --  0.63  CALCIUM 8.3* 8.0*  --  8.4*  GFRNONAA >60 >60  --  >60  GFRAA >60 >60  --  >60  PROT 6.7 5.9*  --  6.8  ALBUMIN 2.9* 2.4*  --  2.6*  AST 63* 53*  --  66*  ALT 51 42  --  42  ALKPHOS 297* 240*  --  369*  BILITOT 3.1* 2.5*  --  1.7*  BILIDIR 1.9*  --   --   --   IBILI 1.2*  --   --   --    Amo CA 19-9   09/14/16 220 --> 09/28/16 176--> 10/12/16 167-->12/07/2016 64-->12/14/2016 60. --> 12/29/2016 87--> 01/26/2017 99-->03/02/2017 53-->03/23/2017 72   RADIOGRAPHIC STUDIES: I have personally reviewed the radiological images as listed and agreed with the findings in the report. CT 12/14/2016 [facility] Impression: 1. Slight interval decrease in size of the left hepatic lobe cholangiocarcinoma. Slight interval decrease in size of the hypoattenuating left hepatic lobe lesion which now measures 3.6 x 2.8 cm, previously 3.9 x 2.9 cm 2. Slight interval increase in size of a porta hepatis lymph node. Other nodes are slightly smaller. 3. Multiple bilateral thyroid nodules. Recommend ultrasound for further evaluation. 4. Stable mediastinal lymph nodes.  CT chest abdomen pelvis 02/24/2017 comparing to 08/25/16 Study in [facility].  1. No acute findings within the chest, abdomen or pelvis. 2. There has been interval decrease in size of mass associated with left lobe of liver. No specific findings identified to suggest metastatic disease.Mass within the anterior and central aspect of the left lobe appears decreased in size from previous exam measuring 3.0 x 3.2 cm, 3. Bilateral percutaneous biliary drainage catheters are in place without significant biliary ductal dilatation. 4. Bilateral pleural calcifications and chronic interstitial changes of pulmonary fibrosis. Prominent mediastinal lymph nodes are nonspecific in the setting of interstitial lung disease.  CT  abdomen pelvis w contrast 04/18/2017 comparing to 11.28/2018 study 2.8 x 2.5 cm hypoenhancing mass in segment 3 of the liver, mildly decreased. Associated atrophy of the lateral segment left hepatic lobe. Two indwelling biliary stent, unchanged. Small nodes in the porta hepatis measuring up to 11 mm short axis, stable versus mildly decreased. No evidence of new/progressive metastatic disease.  CT abdomen pelvis w contrast 04/29/2017 comparing to 04/18/2017 exam.  Stable exam.  No new or progressive findings. 2.5 x 2.1 cm liver lesion, stable appearance of 2 bilary drains.    ASSESSMENT & PLAN:  Cancer Staging Cholangiocarcinoma [facility]) Staging form: Intrahepatic Bile Duct, AJCC 8th Edition - Clinical stage from 12/29/2016: Stage IV (cT1, cNX, pM1) - Signed by Earlie Server, MD on 12/29/2016  1. Cholangiocarcinoma ([place])   2. Hyperbilirubinemia   3. Hypokalemia   4. Dehydration   5. Other fatigue   6. Sepsis, due to unspecified organism [facility])   # sepsis: Patient is febrile, tachycardic and has leukocytosis white count today is 13.2, predominantly neutrophilia.  UA showed negative leukocytes, nitrate.  Check blood culture x  2 sets. Check lactic acid level.   # Hypokalemia and hypomagnesia: will give her IV KCL 86mq and Magnesium 2g at infusion center.  # hyper bilirubinemia, etiology unclear. No obstruction on recent ultrasound and CT abdomen # chronic steroid use, cortisol and ACTH level recently checked and was normal.  I discussed with patient regarding CODE STATUS. She clearly stated that she  Does not want resuscitation or intubation.  Code status; DNR/DNI.   # Patient has meet SIRS criteria, suspect sepsis. I discussed with patient that she needs to be admitted, started on IV antibiotics, awaiting blood culture results. I suspect the exchange of biliary drainage tubes introduced bacteremia, possible cholangitis. Consult ID.  Discussed with patient's [facility] oncologist Dr.Hsu DShanon Brow patient  has to be seeing an outpatient clinic to be directly admitted. I do not think patient is stable enough to send home today. I have [facility] with [facility] and discharged to be seen by Dr. HRigober[facility] Noeloutpatient, [facility]. [facility] Dr.[facility].  While waiting for her to be transferred to direct admission to floor, we plan to give potassium 40 mEq, magnesium 2 g, vancomycin and Zosyn. Since she just had two CT scan in the past few weeks, I will hold additional CT scans for now.   Total face to face encounter time for this patient visit was 40 min. >50% of the time was  spent in counseling and coordination of care.  Return of visit: follow-up after discharge from hospital.  ZEarlie Server MD, PhD Hematology [facility]West [facility] HospitalPager- [phone]/16/2019

## 2017-05-04 ENCOUNTER — Inpatient Hospital Stay (HOSPITAL_BASED_OUTPATIENT_CLINIC_OR_DEPARTMENT_OTHER): Payer: PPO | Admitting: Oncology

## 2017-05-04 ENCOUNTER — Inpatient Hospital Stay: Payer: PPO

## 2017-05-04 ENCOUNTER — Encounter: Payer: Self-pay | Admitting: Oncology

## 2017-05-04 ENCOUNTER — Inpatient Hospital Stay
Admission: AD | Admit: 2017-05-04 | Discharge: 2017-05-05 | DRG: 872 | Disposition: A | Discharge status: Home / Self Care | Payer: PPO | Source: Ambulatory Visit | Attending: Family Medicine | Admitting: Family Medicine

## 2017-05-04 VITALS — BP 137/76 | HR 121 | Temp 101.0°F | Resp 22

## 2017-05-04 DIAGNOSIS — M5136 Other intervertebral disc degeneration, lumbar region: Secondary | ICD-10-CM | POA: Diagnosis not present

## 2017-05-04 DIAGNOSIS — K219 Gastro-esophageal reflux disease without esophagitis: Secondary | ICD-10-CM | POA: Diagnosis not present

## 2017-05-04 DIAGNOSIS — Z9689 Presence of other specified functional implants: Secondary | ICD-10-CM | POA: Diagnosis not present

## 2017-05-04 DIAGNOSIS — R112 Nausea with vomiting, unspecified: Secondary | ICD-10-CM | POA: Diagnosis not present

## 2017-05-04 DIAGNOSIS — E119 Type 2 diabetes mellitus without complications: Secondary | ICD-10-CM | POA: Diagnosis present

## 2017-05-04 DIAGNOSIS — A419 Sepsis, unspecified organism: Secondary | ICD-10-CM

## 2017-05-04 DIAGNOSIS — F329 Major depressive disorder, single episode, unspecified: Secondary | ICD-10-CM | POA: Diagnosis present

## 2017-05-04 DIAGNOSIS — Z803 Family history of malignant neoplasm of breast: Secondary | ICD-10-CM

## 2017-05-04 DIAGNOSIS — D6481 Anemia due to antineoplastic chemotherapy: Secondary | ICD-10-CM | POA: Diagnosis not present

## 2017-05-04 DIAGNOSIS — C221 Intrahepatic bile duct carcinoma: Secondary | ICD-10-CM | POA: Diagnosis present

## 2017-05-04 DIAGNOSIS — Z90710 Acquired absence of both cervix and uterus: Secondary | ICD-10-CM | POA: Diagnosis not present

## 2017-05-04 DIAGNOSIS — R000 Tachycardia, unspecified: Secondary | ICD-10-CM

## 2017-05-04 DIAGNOSIS — J449 Chronic obstructive pulmonary disease, unspecified: Secondary | ICD-10-CM | POA: Diagnosis not present

## 2017-05-04 DIAGNOSIS — R509 Fever, unspecified: Secondary | ICD-10-CM | POA: Diagnosis not present

## 2017-05-04 DIAGNOSIS — J9611 Chronic respiratory failure with hypoxia: Secondary | ICD-10-CM | POA: Diagnosis present

## 2017-05-04 DIAGNOSIS — E860 Dehydration: Secondary | ICD-10-CM | POA: Diagnosis not present

## 2017-05-04 DIAGNOSIS — I10 Essential (primary) hypertension: Secondary | ICD-10-CM | POA: Diagnosis present

## 2017-05-04 DIAGNOSIS — R5383 Other fatigue: Secondary | ICD-10-CM | POA: Diagnosis not present

## 2017-05-04 DIAGNOSIS — Z7951 Long term (current) use of inhaled steroids: Secondary | ICD-10-CM | POA: Diagnosis not present

## 2017-05-04 DIAGNOSIS — Z8619 Personal history of other infectious and parasitic diseases: Secondary | ICD-10-CM | POA: Diagnosis not present

## 2017-05-04 DIAGNOSIS — R739 Hyperglycemia, unspecified: Secondary | ICD-10-CM | POA: Diagnosis not present

## 2017-05-04 DIAGNOSIS — Z825 Family history of asthma and other chronic lower respiratory diseases: Secondary | ICD-10-CM | POA: Diagnosis not present

## 2017-05-04 DIAGNOSIS — D72828 Other elevated white blood cell count: Secondary | ICD-10-CM

## 2017-05-04 DIAGNOSIS — Z79891 Long term (current) use of opiate analgesic: Secondary | ICD-10-CM | POA: Diagnosis not present

## 2017-05-04 DIAGNOSIS — M1990 Unspecified osteoarthritis, unspecified site: Secondary | ICD-10-CM | POA: Diagnosis present

## 2017-05-04 DIAGNOSIS — R0602 Shortness of breath: Secondary | ICD-10-CM | POA: Diagnosis not present

## 2017-05-04 DIAGNOSIS — R6520 Severe sepsis without septic shock: Secondary | ICD-10-CM

## 2017-05-04 DIAGNOSIS — Z880 Allergy status to penicillin: Secondary | ICD-10-CM | POA: Diagnosis not present

## 2017-05-04 DIAGNOSIS — Z79899 Other long term (current) drug therapy: Secondary | ICD-10-CM | POA: Diagnosis not present

## 2017-05-04 DIAGNOSIS — E876 Hypokalemia: Secondary | ICD-10-CM | POA: Diagnosis not present

## 2017-05-04 DIAGNOSIS — Z9981 Dependence on supplemental oxygen: Secondary | ICD-10-CM | POA: Diagnosis not present

## 2017-05-04 DIAGNOSIS — Z66 Do not resuscitate: Secondary | ICD-10-CM | POA: Diagnosis present

## 2017-05-04 DIAGNOSIS — R630 Anorexia: Secondary | ICD-10-CM | POA: Diagnosis not present

## 2017-05-04 DIAGNOSIS — Z87891 Personal history of nicotine dependence: Secondary | ICD-10-CM

## 2017-05-04 DIAGNOSIS — E871 Hypo-osmolality and hyponatremia: Secondary | ICD-10-CM | POA: Diagnosis not present

## 2017-05-04 DIAGNOSIS — R531 Weakness: Secondary | ICD-10-CM | POA: Diagnosis not present

## 2017-05-04 DIAGNOSIS — Z7952 Long term (current) use of systemic steroids: Secondary | ICD-10-CM

## 2017-05-04 DIAGNOSIS — R21 Rash and other nonspecific skin eruption: Secondary | ICD-10-CM | POA: Diagnosis not present

## 2017-05-04 DIAGNOSIS — Z7984 Long term (current) use of oral hypoglycemic drugs: Secondary | ICD-10-CM | POA: Diagnosis not present

## 2017-05-04 DIAGNOSIS — Z9221 Personal history of antineoplastic chemotherapy: Secondary | ICD-10-CM | POA: Diagnosis not present

## 2017-05-04 DIAGNOSIS — Z8042 Family history of malignant neoplasm of prostate: Secondary | ICD-10-CM | POA: Diagnosis not present

## 2017-05-04 DIAGNOSIS — D696 Thrombocytopenia, unspecified: Secondary | ICD-10-CM | POA: Diagnosis not present

## 2017-05-04 DIAGNOSIS — Z8719 Personal history of other diseases of the digestive system: Secondary | ICD-10-CM | POA: Diagnosis not present

## 2017-05-04 LAB — COMPREHENSIVE METABOLIC PANEL
ALK PHOS: 369 U/L — AB (ref 38–126)
ALT: 42 U/L (ref 14–54)
ALT: 41 U/L (ref 14–54)
ANION GAP: 11 (ref 5–15)
AST: 66 U/L — AB (ref 15–41)
AST: 65 U/L — AB (ref 15–41)
Albumin: 2.6 g/dL — ABNORMAL LOW (ref 3.5–5.0)
Albumin: 2.4 g/dL — ABNORMAL LOW (ref 3.5–5.0)
Alkaline Phosphatase: 332 U/L — ABNORMAL HIGH (ref 38–126)
Anion gap: 10 (ref 5–15)
BILIRUBIN TOTAL: 1.7 mg/dL — AB (ref 0.3–1.2)
BILIRUBIN TOTAL: 1.4 mg/dL — AB (ref 0.3–1.2)
BUN: 10 mg/dL (ref 6–20)
BUN: 9 mg/dL (ref 6–20)
CALCIUM: 8.4 mg/dL — AB (ref 8.9–10.3)
CHLORIDE: 103 mmol/L (ref 101–111)
CO2: 27 mmol/L (ref 22–32)
CO2: 25 mmol/L (ref 22–32)
Calcium: 7.9 mg/dL — ABNORMAL LOW (ref 8.9–10.3)
Chloride: 94 mmol/L — ABNORMAL LOW (ref 101–111)
Creatinine, Ser: 0.63 mg/dL (ref 0.44–1.00)
Creatinine, Ser: 0.59 mg/dL (ref 0.44–1.00)
GFR calc Af Amer: >60 mL/min (ref >60)
GFR calc Af Amer: >60 mL/min (ref >60)
GFR calc non Af Amer: >60 mL/min (ref >60)
GLUCOSE: 167 mg/dL — AB (ref 65–99)
GLUCOSE: 183 mg/dL — AB (ref 65–99)
POTASSIUM: 3.4 mmol/L — AB (ref 3.5–5.1)
Potassium: 2.9 mmol/L — ABNORMAL LOW (ref 3.5–5.1)
Sodium: 132 mmol/L — ABNORMAL LOW (ref 135–145)
Sodium: 138 mmol/L (ref 135–145)
TOTAL PROTEIN: 6.8 g/dL (ref 6.5–8.1)
Total Protein: 6 g/dL — ABNORMAL LOW (ref 6.5–8.1)

## 2017-05-04 LAB — URINALYSIS, ROUTINE W REFLEX MICROSCOPIC
Bacteria, UA: NONE SEEN
Bilirubin Urine: NEGATIVE
GLUCOSE, UA: NEGATIVE mg/dL
Ketones, ur: NEGATIVE mg/dL
Leukocytes, UA: NEGATIVE
Nitrite: NEGATIVE
PH: 6 (ref 5.0–8.0)
Protein, ur: 30 mg/dL — AB
SPECIFIC GRAVITY, URINE: 1.01 (ref 1.005–1.030)

## 2017-05-04 LAB — LACTIC ACID, PLASMA: LACTIC ACID, VENOUS: 1.2 mmol/L (ref 0.5–1.9)

## 2017-05-04 LAB — CBC WITH DIFFERENTIAL/PLATELET
BASOS PCT: 0 %
Basophils Absolute: 0 10*3/uL (ref 0–0.1)
EOS PCT: 0 %
Eosinophils Absolute: 0.1 10*3/uL (ref 0–0.7)
HCT: 26.2 % — ABNORMAL LOW (ref 35.0–47.0)
Hemoglobin: 8.4 g/dL — ABNORMAL LOW (ref 12.0–16.0)
Lymphocytes Relative: 4 %
Lymphs Abs: 0.5 10*3/uL — ABNORMAL LOW (ref 1.0–3.6)
MCH: 29.8 pg (ref 26.0–34.0)
MCHC: 32.1 g/dL (ref 32.0–36.0)
MCV: 92.9 fL (ref 80.0–100.0)
MONO ABS: 1.6 10*3/uL — AB (ref 0.2–0.9)
Monocytes Relative: 12 %
Neutro Abs: 11 10*3/uL — ABNORMAL HIGH (ref 1.4–6.5)
Neutrophils Relative %: 84 %
PLATELETS: 256 10*3/uL (ref 150–440)
RBC: 2.82 MIL/uL — ABNORMAL LOW (ref 3.80–5.20)
RDW: 17.1 % — AB (ref 11.5–14.5)
WBC: 13.2 10*3/uL — AB (ref 3.6–11.0)

## 2017-05-04 LAB — GLUCOSE, CAPILLARY
GLUCOSE-CAPILLARY: 148 mg/dL — AB (ref 65–99)
Glucose-Capillary: 153 mg/dL — ABNORMAL HIGH (ref 65–99)

## 2017-05-04 LAB — MAGNESIUM
MAGNESIUM: 1.3 mg/dL — AB (ref 1.7–2.4)
Magnesium: 2.2 mg/dL (ref 1.7–2.4)

## 2017-05-04 MED ORDER — ENOXAPARIN SODIUM 40 MG/0.4ML ~~LOC~~ SOLN
40.0000 mg | SUBCUTANEOUS | Status: DC
  Administered 2017-05-04: 20:00:00 40 mg via SUBCUTANEOUS
  Filled 2017-05-04: qty 0.4

## 2017-05-04 MED ORDER — SODIUM CHLORIDE 0.9 % IV SOLN
INTRAVENOUS | Status: DC
  Administered 2017-05-04: 12:00:00 via INTRAVENOUS
  Filled 2017-05-04: qty 1000

## 2017-05-04 MED ORDER — POTASSIUM CHLORIDE 20 MEQ/100ML IV SOLN: 20.0000 meq | Freq: Once | INTRAVENOUS | Status: DC

## 2017-05-04 MED ORDER — PANTOPRAZOLE SODIUM 40 MG PO TBEC
40.0000 mg | DELAYED_RELEASE_TABLET | Freq: Every day | ORAL | Status: DC
  Administered 2017-05-04 – 2017-05-05 (×2): 40 mg via ORAL
  Filled 2017-05-04 (×2): qty 1

## 2017-05-04 MED ORDER — MAGNESIUM SULFATE 2 GM/50ML IV SOLN
2.0000 g | Freq: Once | INTRAVENOUS | Status: AC
  Administered 2017-05-04: 2 g via INTRAVENOUS
  Filled 2017-05-04: qty 50

## 2017-05-04 MED ORDER — INSULIN ASPART 100 UNIT/ML ~~LOC~~ SOLN
0.0000 [IU] | Freq: Three times a day (TID) | SUBCUTANEOUS | Status: DC
  Administered 2017-05-04: 17:00:00 2 [IU] via SUBCUTANEOUS
  Filled 2017-05-04: qty 1

## 2017-05-04 MED ORDER — MAGNESIUM SULFATE 2 GM/50ML IV SOLN
2.0000 g | INTRAVENOUS | Status: AC
  Administered 2017-05-04: 17:00:00 2 g via INTRAVENOUS
  Filled 2017-05-04: qty 50

## 2017-05-04 MED ORDER — ACETAMINOPHEN 325 MG PO TABS
650.0000 mg | ORAL_TABLET | Freq: Four times a day (QID) | ORAL | Status: DC | PRN
  Administered 2017-05-05 (×2): 650 mg via ORAL
  Filled 2017-05-04 (×2): qty 2

## 2017-05-04 MED ORDER — MOMETASONE FURO-FORMOTEROL FUM 200-5 MCG/ACT IN AERO
2.0000 | INHALATION_SPRAY | Freq: Two times a day (BID) | RESPIRATORY_TRACT | Status: DC
  Administered 2017-05-04: 20:00:00 2 via RESPIRATORY_TRACT
  Filled 2017-05-04: qty 8.8

## 2017-05-04 MED ORDER — ACETAMINOPHEN 650 MG RE SUPP
650.0000 mg | Freq: Four times a day (QID) | RECTAL | Status: DC | PRN
Start: 2017-05-04 — End: 2017-05-05

## 2017-05-04 MED ORDER — POTASSIUM CHLORIDE 20 MEQ/100ML IV SOLN
20.0000 meq | Freq: Once | INTRAVENOUS | Status: AC
  Administered 2017-05-04: 20 meq via INTRAVENOUS
  Filled 2017-05-04: qty 100

## 2017-05-04 MED ORDER — PIPERACILLIN-TAZOBACTAM 3.375 G IVPB
3.3750 g | Freq: Once | INTRAVENOUS | Status: DC
  Filled 2017-05-04 (×2): qty 50

## 2017-05-04 MED ORDER — ONDANSETRON HCL 4 MG/2ML IJ SOLN: 4.0000 mg | Freq: Four times a day (QID) | INTRAMUSCULAR | Status: DC | PRN

## 2017-05-04 MED ORDER — PIPERACILLIN-TAZOBACTAM 3.375 G IVPB
3.3750 g | Freq: Three times a day (TID) | INTRAVENOUS | Status: DC
  Administered 2017-05-04 – 2017-05-05 (×3): 3.375 g via INTRAVENOUS
  Filled 2017-05-04 (×3): qty 50

## 2017-05-04 MED ORDER — POTASSIUM CHLORIDE CRYS ER 20 MEQ PO TBCR
40.0000 meq | EXTENDED_RELEASE_TABLET | Freq: Two times a day (BID) | ORAL | Status: AC
  Administered 2017-05-05 (×2): 40 meq via ORAL
  Filled 2017-05-04 (×2): qty 2

## 2017-05-04 MED ORDER — VANCOMYCIN HCL IN DEXTROSE 1-5 GM/200ML-% IV SOLN
1000.0000 mg | Freq: Once | INTRAVENOUS | Status: DC
  Filled 2017-05-04 (×3): qty 200

## 2017-05-04 MED ORDER — BISACODYL 5 MG PO TBEC
5.0000 mg | DELAYED_RELEASE_TABLET | Freq: Every day | ORAL | Status: DC | PRN
  Administered 2017-05-04: 20:00:00 5 mg via ORAL
  Filled 2017-05-04: qty 1

## 2017-05-04 MED ORDER — SODIUM CHLORIDE 0.9 % IV SOLN
INTRAVENOUS | Status: DC
  Administered 2017-05-04: 17:00:00 via INTRAVENOUS

## 2017-05-04 MED ORDER — ZOLPIDEM TARTRATE 5 MG PO TABS
5.0000 mg | ORAL_TABLET | Freq: Every evening | ORAL | Status: DC | PRN
  Administered 2017-05-04: 5 mg via ORAL
  Filled 2017-05-04: qty 1

## 2017-05-04 MED ORDER — SODIUM CHLORIDE 0.9 % IV SOLN
Freq: Once | INTRAVENOUS | Status: AC
Start: 2017-05-04 — End: 2017-05-04
  Administered 2017-05-04: 13:00:00 via INTRAVENOUS
  Filled 2017-05-04: qty 1000

## 2017-05-04 MED ORDER — POTASSIUM CHLORIDE 20 MEQ/100ML IV SOLN
20.0000 meq | Freq: Once | INTRAVENOUS | Status: AC
Start: 2017-05-04 — End: 2017-05-04
  Administered 2017-05-04: 20 meq via INTRAVENOUS
  Filled 2017-05-04: qty 100

## 2017-05-04 MED ORDER — VANCOMYCIN HCL IN DEXTROSE 1-5 GM/200ML-% IV SOLN
1000.0000 mg | INTRAVENOUS | Status: DC
  Administered 2017-05-05: 1000 mg via INTRAVENOUS
  Filled 2017-05-04 (×4): qty 200

## 2017-05-04 MED ORDER — POLYETHYLENE GLYCOL 3350 17 G PO PACK: 17.0000 g | PACK | Freq: Every day | ORAL | Status: DC | PRN

## 2017-05-04 MED ORDER — VANCOMYCIN HCL IN DEXTROSE 1-5 GM/200ML-% IV SOLN
1000.0000 mg | Freq: Once | INTRAVENOUS | Status: DC
  Filled 2017-05-04: qty 200

## 2017-05-04 MED ORDER — ONDANSETRON HCL 4 MG PO TABS
4.0000 mg | ORAL_TABLET | Freq: Four times a day (QID) | ORAL | Status: DC | PRN
  Administered 2017-05-05: 4 mg via ORAL
  Filled 2017-05-04: qty 1

## 2017-05-04 MED ORDER — AMLODIPINE BESYLATE 10 MG PO TABS
10.0000 mg | ORAL_TABLET | Freq: Every day | ORAL | Status: DC
  Administered 2017-05-04 – 2017-05-05 (×2): 10 mg via ORAL
  Filled 2017-05-04 (×2): qty 1

## 2017-05-04 MED ORDER — VENLAFAXINE HCL ER 75 MG PO CP24
225.0000 mg | ORAL_CAPSULE | Freq: Every day | ORAL | Status: DC
  Administered 2017-05-04 – 2017-05-05 (×2): 225 mg via ORAL
  Filled 2017-05-04 (×2): qty 3

## 2017-05-04 MED ORDER — SODIUM CHLORIDE 0.9 % IV SOLN
Freq: Once | INTRAVENOUS | Status: AC
  Administered 2017-05-04: 12:00:00 via INTRAVENOUS
  Filled 2017-05-04: qty 1000

## 2017-05-04 MED ORDER — METOPROLOL TARTRATE 25 MG PO TABS
25.0000 mg | ORAL_TABLET | Freq: Two times a day (BID) | ORAL | Status: DC
  Administered 2017-05-04 – 2017-05-05 (×2): 25 mg via ORAL
  Filled 2017-05-04 (×2): qty 1

## 2017-05-04 MED ORDER — SODIUM CHLORIDE 0.9 % IV SOLN: 2.0000 g | Freq: Once | INTRAVENOUS | Status: DC

## 2017-05-04 MED ORDER — DEXAMETHASONE 0.5 MG PO TABS
1.0000 mg | ORAL_TABLET | Freq: Every day | ORAL | Status: DC
  Administered 2017-05-05: 1 mg via ORAL
  Filled 2017-05-04: qty 2

## 2017-05-04 MED ORDER — GABAPENTIN 100 MG PO CAPS
100.0000 mg | ORAL_CAPSULE | Freq: Three times a day (TID) | ORAL | Status: DC
  Administered 2017-05-04 – 2017-05-05 (×4): 100 mg via ORAL
  Filled 2017-05-04 (×4): qty 1

## 2017-05-04 MED ORDER — POTASSIUM CHLORIDE CRYS ER 20 MEQ PO TBCR
20.0000 meq | EXTENDED_RELEASE_TABLET | Freq: Once | ORAL | Status: AC
  Administered 2017-05-04: 20 meq via ORAL
  Filled 2017-05-04: qty 1

## 2017-05-04 MED ORDER — VANCOMYCIN HCL IN DEXTROSE 1-5 GM/200ML-% IV SOLN
1000.0000 mg | INTRAVENOUS | Status: AC
  Administered 2017-05-04: 1000 mg via INTRAVENOUS
  Filled 2017-05-04: qty 200

## 2017-05-04 MED ORDER — ESTRADIOL 1 MG PO TABS
1.0000 mg | ORAL_TABLET | Freq: Every day | ORAL | Status: DC
  Administered 2017-05-04 – 2017-05-05 (×2): 1 mg via ORAL
  Filled 2017-05-04 (×3): qty 1

## 2017-05-04 MED ORDER — HYDROXYCHLOROQUINE SULFATE 200 MG PO TABS
200.0000 mg | ORAL_TABLET | Freq: Two times a day (BID) | ORAL | Status: DC
  Administered 2017-05-04 – 2017-05-05 (×2): 200 mg via ORAL
  Filled 2017-05-04 (×3): qty 1

## 2017-05-04 MED ORDER — VANCOMYCIN HCL 1000 MG IV SOLR: 1000.0000 mg | Freq: Once | INTRAVENOUS | Status: DC

## 2017-05-04 MED ORDER — IBUPROFEN 400 MG PO TABS: 400.0000 mg | ORAL_TABLET | Freq: Four times a day (QID) | ORAL | Status: DC | PRN

## 2017-05-04 NOTE — Progress Notes (Signed)
ELECTROLYTE CONSULT NOTE - INITIAL   Pharmacy Consult for electrolyte monitoring/replacement Indication: hypokalemia, hypomagnesemia  Allergies  Allergen Reactions  . Penicillins Nausea Only    Patient Measurements:   Vital Signs: Temp: 97.9 F (36.6 C) (01/16 2015) Temp Source: Oral (01/16 2015) BP: 158/79 (01/16 2015) Pulse Rate: 107 (01/16 2015) Intake/Output from previous day: No intake/output data recorded. Intake/Output from this shift: No intake/output data recorded.  Labs: Recent Labs    05/04/17 1049 05/04/17 1209 05/04/17 2058  WBC 13.2*  --   --   HGB 8.4*  --   --   HCT 26.2*  --   --   PLT 256  --   --   CREATININE 0.63  --  0.59  MG  --  1.3* 2.2  ALBUMIN 2.6*  --  2.4*  PROT 6.8  --  6.0*  AST 66*  --  65*  ALT 42  --  41  ALKPHOS 369*  --  332*  BILITOT 1.7*  --  1.4*   Estimated Creatinine Clearance: 55.4 mL/min (by C-G formula based on SCr of 0.59 mg/dL).  Assessment: Patient was directly admitted, pharmacy consulted to assist with electrolyte monitoring and replacement.   Labs drawn earlier today: K = 2.9, Mg = 1.3 Patient received 40 mEq KCl and 2 g magnesium sulfate today PTA.  Goal of Therapy: Electrolytes WNL  Plan:  Will order another magnesium 2 g IV once and KCl 20 mEq PO once MD has ordered Mg/K to be redrawn on admission - will continue to follow.  01/16 @ 2100 K 3.4, Mg 2.2; Will replete w/ KCI 40 mEq PO x 2 and f/u on electrolytes w/ am labs. Mg WNL  Tobie Lords, PharmD, BCPS Clinical Pharmacist 05/04/2017,11:21 PM

## 2017-05-04 NOTE — Progress Notes (Signed)
ELECTROLYTE CONSULT NOTE - INITIAL   Pharmacy Consult for electrolyte monitoring/replacement Indication: hypokalemia, hypomagnesemia  Allergies  Allergen Reactions  . Penicillins Nausea Only    Patient Measurements:   Vital Signs: Temp: 101 F (38.3 C) (01/16 1108) Temp Source: Tympanic (01/16 1108) BP: 137/76 (01/16 1108) Pulse Rate: 121 (01/16 1108) Intake/Output from previous day: No intake/output data recorded. Intake/Output from this shift: No intake/output data recorded.  Labs: Recent Labs    05/04/17 1049 05/04/17 1209  WBC 13.2*  --   HGB 8.4*  --   HCT 26.2*  --   PLT 256  --   CREATININE 0.63  --   MG  --  1.3*  ALBUMIN 2.6*  --   PROT 6.8  --   AST 66*  --   ALT 42  --   ALKPHOS 369*  --   BILITOT 1.7*  --    Estimated Creatinine Clearance: 55.6 mL/min (by C-G formula based on SCr of 0.63 mg/dL).  Assessment: Patient was directly admitted, pharmacy consulted to assist with electrolyte monitoring and replacement.   Labs drawn earlier today: K = 2.9, Mg = 1.3 Patient received 40 mEq KCl and 2 g magnesium sulfate today PTA.  Goal of Therapy: Electrolytes WNL  Plan:  Will order another magnesium 2 g IV once and KCl 20 mEq PO once MD has ordered Mg/K to be redrawn on admission - will continue to follow.  Will recheck electrolytes with AM labs tomorrow as well.  Lenis Noon, PharmD, BCPS Clinical Pharmacist 05/04/2017,4:16 PM

## 2017-05-04 NOTE — Consult Note (Signed)
[facility] Infectious Disease     Reason for Consult: sepsis    Referring Physician: Carlynn Spry Date of Admission:  05/04/2017   Active Problems:   Sepsis ([place])   HPI: Regina Hood is a 77 y.o. female with hx of Cholangiocarcinoma, DM, Htn- admitted with fever, leukocytosis, weakness.  She had recent admission for abd distention as well as elevated lactic acid and T bil. She had [place] and CT done and had neg BCX.  Treated initially with vanco and zosyn then dced on oral augmenti.  She has biliary drainage tubes external-internal and flushes them daily. She denies issues with it. She denies cough or SOB. Is on home O2. Denies shaking chills or night sweats. Reports eating and drinking ok.     Past Medical History:  Diagnosis Date  . Arthritis   . Cancer ([place])   . Cellulitis   . Collagen vascular disease ([place])   . COPD (chronic obstructive pulmonary disease) ([place])   . DDD (degenerative disc disease), lumbar   . Depression   . Diabetes mellitus   . Diverticulitis   . GERD (gastroesophageal reflux disease)   . GI bleed   . Headache(784.0)   . Hypertension   . Lupus   . Ulcerative colitis [facility])    Past Surgical History:  Procedure Laterality Date  . ABDOMINAL HYSTERECTOMY    . BREAST BIOPSY Right   . COLONOSCOPY    . COLONOSCOPY WITH PROPOFOL N/A 08/18/2016   Procedure: COLONOSCOPY WITH PROPOFOL;  Surgeon: Manya Silvas, MD;  [facility] ENDOSCOPY;  Service: Endoscopy;  Laterality: N/A;  . ESOPHAGOGASTRODUODENOSCOPY (EGD) WITH PROPOFOL N/A 08/18/2016   Procedure: ESOPHAGOGASTRODUODENOSCOPY (EGD) WITH PROPOFOL;  Surgeon: Manya Silvas, MD;  Location: [facility] ENDOSCOPY;  Service: Endoscopy;  Laterality: N/A;  . ORIF HUMERUS FRACTURE  09/17/2011   Procedure: OPEN REDUCTION INTERNAL FIXATION (ORIF) PROXIMAL HUMERUS FRACTURE;  Surgeon: Augustin Schooling, MD;  Location: [place];  Service: Orthopedics;  Laterality: Left;   Social History   Tobacco Use  . Smoking status: Former Smoker     Packs/day: 1.00    Years: 10.00    Pack years: 10.00    Types: Cigarettes    Last attempt to quit: 12/30/1998    Years since quitting: 18.3  . Smokeless tobacco: Never Used  . Tobacco comment: quit smoking in 2003  Substance Use Topics  . Alcohol use: No  . Drug use: No   Family History  Problem Relation Age of Onset  . Breast cancer Mother 53  . COPD Father   . Prostate cancer Brother     Allergies:  Allergies  Allergen Reactions  . Penicillins Nausea Only    Current antibiotics: Antibiotics Given (last 72 hours)    None      MEDICATIONS: . enoxaparin (LOVENOX) injection  40 mg Subcutaneous Q24H    Review of Systems - 11 systems reviewed and negative per HPI   OBJECTIVE: Temp:  [101 F (38.3 C)] 101 F (38.3 C) (01/16 1108) Pulse Rate:  [121] 121 (01/16 1108) Resp:  [22] 22 (01/16 1108) BP: (137)/(76) 137/76 (01/16 1108) SpO2:  [91 %] 91 % (01/16 1122) Physical Exam  Constitutional:  oriented to person, place, and time. Appears chronically ill, on O2 HENT: [place]/AT, PERRLA, no scleral icterus Mouth/Throat: Oropharynx is clear and dry. No oropharyngeal exudate.  Cardiovascular: Normal rate, regular rhythm and normal heart sounds.  Portacth R chest wall is accessed - no redness or tenderness Pulmonary/Chest: poor air movement  Neck = supple, no nuchal rigidity Abdominal: Soft. Bowel sounds are normal.  Mild distention, 2 ext drains in place. One on R has some redness around the stitch with mild ttp.  Mild drainage around each site Lymphadenopathy: no cervical adenopathy. No axillary adenopathy Neurological: alert and oriented to person, place, and time.  Skin: Skin is warm and dry. difsuse chronic scaly eruption Psychiatric: a normal mood and affect.  behavior is normal.    LABS: Results for orders placed or performed in visit on 05/04/17 (from the past 48 hour(s))  Lactic Acid, Plasma     Status: None   Collection Time: 05/04/17 12:09 PM  Result Value Ref  Range   Lactic Acid, Venous 1.2 0.5 - 1.9 mmol/L    Comment: Performed at [facility], [address], [place], [place] [address]  Magnesium     Status: Abnormal   Collection Time: 05/04/17 12:09 PM  Result Value Ref Range   Magnesium 1.3 (L) 1.7 - 2.4 mg/dL    Comment: Performed at [facility], [address] [place], [place] [address]  Urinalysis, Routine w reflex microscopic     Status: Abnormal   Collection Time: 05/04/17 12:45 PM  Result Value Ref Range   Color, Urine YELLOW (A) YELLOW   APPearance CLEAR (A) CLEAR   Specific Gravity, Urine 1.010 1.005 - 1.030   pH 6.0 5.0 - 8.0   Glucose, UA NEGATIVE NEGATIVE mg/dL   Hgb urine dipstick SMALL (A) NEGATIVE   Bilirubin Urine NEGATIVE NEGATIVE   Ketones, ur NEGATIVE NEGATIVE mg/dL   Protein, ur 30 (A) NEGATIVE mg/dL   Nitrite NEGATIVE NEGATIVE   Leukocytes, UA NEGATIVE NEGATIVE   RBC / HPF 0-5 0 - 5 RBC/hpf   WBC, UA 0-5 0 - 5 WBC/hpf   Bacteria, UA NONE SEEN NONE SEEN   Squamous Epithelial / LPF 0-5 (A) NONE SEEN   Mucus PRESENT     Comment: Performed at [facility], [address] [place], [place] [address]   No components found for: ESR, C REACTIVE PROTEIN MICRO: Recent Results (from the past 720 hour(s))  Culture, blood (routine x 2)     Status: None   Collection Time: 04/27/17 11:37 AM  Result Value Ref Range Status   Specimen Description   Final    BLOOD RIGHT HAND Performed at [facility], [address], [place], [place] [address]    Special Requests   Final    BOTTLES DRAWN AEROBIC AND ANAEROBIC Blood Culture adequate volume Performed at [facility], [address] [place], [place] [address]    Culture   Final    NO GROWTH 5 DAYS Performed at [facility], [address], [place], [place] [address]    Report Status 05/02/2017 FINAL  Final  Culture, blood (routine x 2)     Status: None   Collection Time: 04/27/17 11:49 AM  Result Value Ref Range  Status   Specimen Description   Final    BLOOD LEFT HAND Performed at [facility], [address] [place], [place] [address]    Special Requests   Final    BOTTLES DRAWN AEROBIC AND ANAEROBIC Blood Culture adequate volume Performed at [facility], [address] [place], [place] [address]    Culture   Final    NO GROWTH 5 DAYS Performed at [facility], [address] [place], [place] [address]    Report Status 05/02/2017 FINAL  Final  IMAGING: Dg Chest 2 View  Result Date: 04/27/2017 CLINICAL DATA:  Several years of shortness of breath with onset of weakness several months ago. History of hepatic malignancy on chemotherapy. Also history of COPD, former smoker, diabetes, and lupus. EXAM: CHEST  2 VIEW COMPARISON:  Chest x-ray and chest CT scan of March 16, 2017 FINDINGS: The lungs are adequately inflated. The interstitial markings are coarse but are not as conspicuous as on the previous study. The heart is normal in size. The pulmonary vascularity is not clearly engorged. There is no pleural effusion. The porta catheter tip projects over the midportion of the SVC. The mediastinum is normal in width. The bony thorax exhibits no acute abnormality. Drainage catheters are present in the upper abdomen. IMPRESSION: Chronic interstitial changes throughout both lungs less conspicuous than on the previous study. No definite acute pneumonia nor CHF. No findings suspicious for metastatic disease. Electronically Signed   By: Karn Derk  [place] M.D.   On: 04/27/2017 10:04   Ct Abdomen Pelvis W Contrast  Result Date: 04/29/2017 CLINICAL DATA:  Abdominal distention. Lactic acidosis. History of metastatic cholangiocarcinoma. EXAM: CT ABDOMEN AND PELVIS WITH CONTRAST TECHNIQUE: Multidetector CT imaging of the abdomen and pelvis was performed using the standard protocol following bolus administration of intravenous contrast. CONTRAST:  17m ISOVUE-300 IOPAMIDOL (ISOVUE-300)  INJECTION 61% COMPARISON:  04/18/2017 FINDINGS: Lower chest: Bilateral calcified pleural plaques. Small lymph nodes in the retrocrural space bilaterally are similar to prior. Chronic interstitial changes are noted in the lung bases bilaterally. Hepatobiliary: Previously described hypoenhancing lesion and segment III of the liver is similar measuring 2.5 x 2.1 cm today compared to 2.8 x 2.5 cm previously. Atrophy of the lateral segment left liver again noted. No focal abnormality in the right liver. 2 internal external biliary drains are evident with distal loop formed in the duodenal lumen. Gallbladder is decompressed with gas in the lumen compatible with the presence of the biliary drains. Pancreas: No focal mass lesion. No dilatation of the main duct. No intraparenchymal cyst. No peripancreatic edema. Spleen: No splenomegaly. No focal mass lesion. Adrenals/Urinary Tract: No adrenal nodule or mass. Kidneys are unremarkable. No evidence for hydroureter. The urinary bladder appears normal for the degree of distention. Stomach/Bowel: Stomach is nondistended. No gastric wall thickening. No evidence of outlet obstruction. Duodenum is normally positioned as is the ligament of Treitz. No small bowel wall thickening. No small bowel dilatation. The terminal ileum is normal. The appendix is normal. Diverticular changes are noted in the left colon without evidence of diverticulitis. Vascular/Lymphatic: There is abdominal aortic atherosclerosis without aneurysm. Scattered small lymph nodes in the upper abdomen are stable in the interval. No pelvic sidewall lymphadenopathy. Reproductive: Uterus surgically absent. Similar appearance small follicle left ovary. Other: No intraperitoneal free fluid. Musculoskeletal: Bone windows reveal no worrisome lytic or sclerotic osseous lesions. IMPRESSION: 1. Stable exam.  No new or progressive findings. 2. Approximately 3 cm hypoenhancing lesion in the left liver is similar to prior. 3.  Stable appearance of the 2 biliary drains. 4. Similar appearance retrocrural and upper abdominal small lymph nodes. 5.  Aortic Atherosclerois (ICD10-170.0) Electronically Signed   By: EMisty StanleyM.D.   On: 04/29/2017 14:45   Ct Abdomen Pelvis W Contrast  Result Date: 04/18/2017 CLINICAL DATA:  Cholangiocarcinoma diagnosed 1 year ago, on chemotherapy EXAM: CT ABDOMEN AND PELVIS WITH CONTRAST TECHNIQUE: Multidetector CT imaging of the abdomen and pelvis was performed using the standard protocol following bolus administration of intravenous contrast. CONTRAST:  1073mISOVUE-300 IOPAMIDOL (  ISOVUE-300) INJECTION 61% COMPARISON:  02/24/2017 FINDINGS: Lower chest: Fibrotic changes at the lung bases, nonspecific. Calcified pleural plaques bilaterally, suggesting asbestos related pleural disease. Hepatobiliary: 2.8 x 2.5 cm ill-defined hypoenhancing mass in segment 3 (series 2/image 15), previously 3.0 x 3.2 cm. Associated atrophy of the lateral segment left hepatic lobe with focal biliary ductal dilatation and two internal external biliary drain, unchanged. Gallbladder is decompressed. Pancreas: Within normal limits. Spleen: Within normal limits. Adrenals/Urinary Tract: Adrenal glands are within normal limits. Malrotated right kidney. Kidneys are otherwise within normal limits. No hydronephrosis. Bladder is within normal limits. Stomach/Bowel: Stomach is within normal limits. No evidence of bowel obstruction. Normal appendix (series 2/ image 45). Left colonic diverticulosis, without evidence of diverticulitis. Moderate colonic stool burden, suggesting constipation. Vascular/Lymphatic: No evidence of abdominal aortic aneurysm. Retroaortic left renal vein. Atherosclerotic calcifications of the abdominal aorta and branch vessels. Small nodes in the porta hepatis measuring up to 11 mm short axis (series 2/ image 23), previously 12 mm. Reproductive: Status post hysterectomy. Right ovary is within normal limits. 19 mm  left ovarian cyst (series 2/image 63), unchanged. Other: No abdominopelvic ascites. Postsurgical changes along the upper midline anterior abdominal wall (series 2/image 20), unchanged. Musculoskeletal: Degenerative changes of the visualized thoracolumbar spine. IMPRESSION: 2.8 x 2.5 cm hypoenhancing mass in segment 3 of the liver, mildly decreased. Associated atrophy of the lateral segment left hepatic lobe. Two indwelling biliary stent, unchanged. Small nodes in the porta hepatis measuring up to 11 mm short axis, stable versus mildly decreased. No evidence of new/progressive metastatic disease. Electronically Signed   By: Julian Hy M.D.   On: 04/18/2017 10:20   Dg Chest Port 1 View  Result Date: 04/29/2017 CLINICAL DATA:  Shortness of breath with wheezing EXAM: PORTABLE CHEST 1 VIEW COMPARISON:  April 27, 2016 FINDINGS: Port-A-Cath tip is in the superior vena cava.  No pneumothorax. Postoperative changes noted on the right. There is interstitial thickening bilaterally without frank edema or consolidation. Heart is upper normal in size with pulmonary vascular within normal limits. No adenopathy. Postoperative change in the proximal left humerus is noted. IMPRESSION: Stable interstitial thickening and postoperative change on the right. No frank edema or consolidation. Stable cardiac silhouette. No pneumothorax. Electronically Signed   By: Lowella Grip III M.D.   On: 04/29/2017 07:53   US Abdomen Limited Ruq  Result Date: 04/28/2017 CLINICAL DATA:  Elevated bilirubin and history of cholangiocarcinoma EXAM: ULTRASOUND ABDOMEN LIMITED RIGHT UPPER QUADRANT COMPARISON:  04/18/2017 FINDINGS: Gallbladder: Gallbladder is decompressed.  No definitive gallstones are seen. Common bile duct: Diameter: Biliary catheters are noted within the common bile duct. Accurate measurement of the common bile duct is unable to be performed. Liver: Stents are noted extending through the left and right lobes into the  common bile duct. The known area of abnormal attenuation on recent CT is not well appreciated on this exam. No biliary ductal dilatation is seen. Portal vein is patent on color Doppler imaging with normal direction of blood flow towards the liver. IMPRESSION: Changes consistent with bilateral biliary drainage catheters Decompressed gallbladder. No other focal abnormality is seen. Electronically Signed   By: Inez Catalina M.D.   On: 04/28/2017 10:04    Assessment:   EvaSmithis a76 y.o.femaleith hx of Cholangiocarcinoma, DM, Htn- admitted with fever, leukocytosis, weakness.  She had recent admission for abd distention as well as elevated lactic acid and T bil. She had [place] and CT done and had neg BCX.  Treated initially with vanco and zosyn  then dced on oral augmenti.  She has biliary drainage tubes external-internal and flushes them daily. She denies issues with it but there is some redness around the R sided drain site suture.   T bili had increased at last admission but nml now. CT done 12/31 was unrevealing and showed drains in place with no ductal dilation to suggest obstruction.  I do suspect the drains are the source of infection either with ascending cholangitis or infection at the skin suface. All cultures have been negative.  Recommendations Cont vanco and zosyn I have cultured the 2 drain sites Await bcx and monitor fever and wbc Thank you very much for allowing me to participate in the care of this patient. Please call with questions.   Cheral Marker. Ola Spurr, MD

## 2017-05-04 NOTE — H&P (Addendum)
[facility] [place] NAME: Regina Hood    MR#:  [id number]  DATE OF BIRTH:  1940-07-27  DATE OF ADMISSION:  05/04/2017  PRIMARY CARE PHYSICIAN: Idelle Crouch, MD   REQUESTING/REFERRING PHYSICIAN: Dr Tasia Catchings  CHIEF COMPLAINT:   fever HISTORY OF PRESENT ILLNESS:  Regina Hood  is a 77 y.o. female with a known history of cholangiocarcinoma status post biliary tubes who was recently hospitalized due to sepsis of unclear etiology and sent home on oral Augmentin. She went to [place] today for follow-up and during this session it was noted she had fever, tachycardia and tachypnea. She was sent from Dr. Collie Siad office as a direct admit due to sepsis. Patient denies fevers at home, chills, abdominal pain, nausea, vomiting, diarrhea, shortness of breath or chest pain. Her biliary drains are draining normally.  PAST MEDICAL HISTORY:   Past Medical History:  Diagnosis Date  . Arthritis   . Cancer ([place])   . Cellulitis   . Collagen vascular disease ([place])   . COPD (chronic obstructive pulmonary disease) ([place])   . DDD (degenerative disc disease), lumbar   . Depression   . Diabetes mellitus   . Diverticulitis   . GERD (gastroesophageal reflux disease)   . GI bleed   . Headache(784.0)   . Hypertension   . Lupus   . Ulcerative colitis ([place])     PAST SURGICAL HISTORY:   Past Surgical History:  Procedure Laterality Date  . ABDOMINAL HYSTERECTOMY    . BREAST BIOPSY Right   . COLONOSCOPY    . COLONOSCOPY WITH PROPOFOL N/A 08/18/2016   Procedure: COLONOSCOPY WITH PROPOFOL;  Surgeon: Manya Silvas, MD;  Location: [facility] ENDOSCOPY;  Service: Endoscopy;  Laterality: N/A;  . ESOPHAGOGASTRODUODENOSCOPY (EGD) WITH PROPOFOL N/A 08/18/2016   Procedure: ESOPHAGOGASTRODUODENOSCOPY (EGD) WITH PROPOFOL;  Surgeon: Manya Silvas, MD;  Location: [facility] ENDOSCOPY;  Service: Endoscopy;  Laterality: N/A;  . ORIF HUMERUS FRACTURE  09/17/2011   Procedure: OPEN REDUCTION  INTERNAL FIXATION (ORIF) PROXIMAL HUMERUS FRACTURE;  Surgeon: Augustin Schooling, MD;  Location: [place];  Service: Orthopedics;  Laterality: Left;    SOCIAL HISTORY:   Social History   Tobacco Use  . Smoking status: Former Smoker    Packs/day: 1.00    Years: 10.00    Pack years: 10.00    Types: Cigarettes    Last attempt to quit: 12/30/1998    Years since quitting: 18.3  . Smokeless tobacco: Never Used  . Tobacco comment: quit smoking in 2003  Substance Use Topics  . Alcohol use: No    FAMILY HISTORY:   Family History  Problem Relation Age of Onset  . Breast cancer Mother 63  . COPD Father   . Prostate cancer Brother     DRUG ALLERGIES:   Allergies  Allergen Reactions  . Penicillins Nausea Only    REVIEW OF SYSTEMS:   Review of Systems  Constitutional: Positive for fever and malaise/fatigue. Negative for chills.  HENT: Negative.  Negative for ear discharge, ear pain, hearing loss, nosebleeds and sore throat.   Eyes: Negative.  Negative for blurred vision and pain.  Respiratory: Negative.  Negative for cough, hemoptysis, shortness of breath and wheezing.   Cardiovascular: Negative.  Negative for chest pain, palpitations and leg swelling.  Gastrointestinal: Negative.  Negative for abdominal pain, blood in stool, diarrhea, nausea and vomiting.  Genitourinary: Negative.  Negative for dysuria.  Musculoskeletal: Negative.  Negative for back pain.  Skin:  Negative.   Neurological: Positive for weakness. Negative for dizziness, tremors, speech change, focal weakness, seizures and headaches.  Endo/Heme/Allergies: Negative.  Does not bruise/bleed easily.  Psychiatric/Behavioral: Negative.  Negative for depression, hallucinations and suicidal ideas.    MEDICATIONS AT HOME:   Prior to Admission medications   Medication Sig Start Date End Date Taking? Authorizing Provider  acetaminophen (TYLENOL) 500 MG tablet Take 1,000 mg by mouth 3 (three) times daily.   Yes [provider]  amLODipine (NORVASC) 10 MG tablet Take 1 tablet (10 mg total) by mouth daily. 04/30/17  Yes Gouru, Illene Silver, MD  amoxicillin-clavulanate (AUGMENTIN) 875-125 MG tablet Take 1 tablet by mouth every 12 (twelve) hours. 04/30/17  Yes Gouru, Illene Silver, MD  calcium carbonate 100 mg/ml SUSP Take by mouth daily.    Yes [provider]  Cyanocobalamin (VITAMIN B-12 PO) Take 1,000 mcg by mouth daily.   Yes [provider]  dexamethasone (DECADRON) 1 MG tablet Take 2 tablets (2 mg total) by mouth daily with breakfast. Patient taking differently: Take 1 mg by mouth daily with breakfast.  03/23/17  Yes Earlie Server, MD  estradiol (ESTRACE) 1 MG tablet Take 1 mg by mouth daily. 06/16/16  Yes [provider]  gabapentin (NEURONTIN) 100 MG capsule Take 100 mg by mouth 3 (three) times daily.    Yes [provider]  hydroxychloroquine (PLAQUENIL) 200 MG tablet Take 200 mg by mouth 2 (two) times daily.    Yes [provider]  lidocaine-prilocaine (EMLA) cream Apply 1 application topically as needed. 02/16/17  Yes Cammie Sickle, MD  LORazepam (ATIVAN) 0.5 MG tablet Take 0.5 mg every 6 (six) hours as needed by mouth. 01/05/17  Yes [provider]  metFORMIN (GLUCOPHAGE) 500 MG tablet Take 500 mg by mouth 2 (two) times daily with a meal.   Yes [provider]  metoprolol tartrate (LOPRESSOR) 25 MG tablet Take 1 tablet (25 mg total) by mouth 2 (two) times daily. 04/30/17  Yes Gouru, Aruna, MD  pantoprazole (PROTONIX) 40 MG tablet Take 40 mg by mouth daily.   Yes [provider]  potassium chloride SA (K-DUR,KLOR-CON) 20 MEQ tablet Take 1 tablet (20 mEq total) by mouth daily as needed (low potassium). 03/09/17  Yes Earlie Server, MD  senna (SENOKOT) 8.6 MG TABS tablet Take 2 tablets (17.2 mg total) by mouth daily. Hold for loose stools. 02/16/17  Yes Charlaine Dalton R, MD  sodium chloride 0.9 % injection Flush each biliary drains (2) with 10 mL  two times daily. 03/18/17  Yes [provider]  Sodium Chloride Flush (NORMAL SALINE FLUSH) 0.9 % SOLN 1 mL by Other route every 8 (eight) hours. Flush each biliary drain with 1 mL twice daily 02/16/17  Yes Cammie Sickle, MD  venlafaxine XR (EFFEXOR-XR) 75 MG 24 hr capsule Take 225 mg by mouth daily.    Yes [provider]  zolpidem (AMBIEN) 10 MG tablet Take 1 tablet (10 mg total) by mouth at bedtime as needed. 02/16/17 05/04/17 Yes Cammie Sickle, MD  Elastic Bandages & Supports ([place]) [place] 2 each by Does not apply route daily. Patient not taking: Reported on 05/04/2017 03/09/17   Earlie Server, MD  Fluticasone-Salmeterol (ADVAIR DISKUS) 250-50 MCG/DOSE AEPB Inhale 1 puff into the lungs daily.  06/01/16 06/01/17  [provider]  ondansetron (ZOFRAN) 8 MG tablet TAKE ONE TABLET EVERY 12 HOURS AS NEEDED Patient not taking: Reported on 05/04/2017 04/06/17   Earlie Server, MD  oxyCODONE (  OXY IR/ROXICODONE) 5 MG immediate release tablet Take 1 tablet (5 mg total) by mouth every 6 (six) hours as needed. Patient not taking: Reported on 05/04/2017 01/12/17   Earlie Server, MD  polyethylene glycol powder [facility]) powder Take 0.5 Containers by mouth.  09/25/16   [provider]  prochlorperazine (COMPAZINE) 10 MG tablet Take 1 tablet (10 mg total) by mouth every 6 (six) hours as needed. 02/16/17   Cammie Sickle, MD  triamcinolone ointment (KENALOG) 0.1 % Apply 1 application topically 2 (two) times daily. Patient not taking: Reported on 05/04/2017 03/09/17   Earlie Server, MD      VITAL SIGNS:  There were no vitals taken for this visit.  PHYSICAL EXAMINATION:   Physical Exam  Constitutional: She is oriented to person, place, and time and well-developed, well-nourished, and in no distress. No distress.  HENT:  Head: Normocephalic.  Eyes: No scleral icterus.  Neck: Normal range of motion. Neck supple. No JVD present. No tracheal  deviation present.  Cardiovascular: Normal rate, regular rhythm and normal heart sounds. Exam reveals no gallop and no friction rub.  No murmur heard. Pulmonary/Chest: Effort normal and breath sounds normal. No respiratory distress. She has no wheezes. She has no rales. She exhibits no tenderness.  Abdominal: Soft. Bowel sounds are normal. She exhibits no distension and no mass. There is no tenderness. There is no rebound and no guarding.  She has 2 biliary drains.One of the drains on the left has a little greenish discharge  Musculoskeletal: Normal range of motion. She exhibits no edema.  Neurological: She is alert and oriented to person, place, and time.  Skin: Skin is warm. No rash noted. No erythema.  She is bruising on her arms  Psychiatric: Affect and judgment normal.      LABORATORY PANEL:   CBC Recent Labs  Lab 05/04/17 1049  WBC 13.2*  HGB 8.4*  HCT 26.2*  PLT 256   ------------------------------------------------------------------------------------------------------------------  Chemistries  Recent Labs  Lab 05/04/17 1049 05/04/17 1209  NA 132*  --   K 2.9*  --   CL 94*  --   CO2 27  --   GLUCOSE 167*  --   BUN 10  --   CREATININE 0.63  --   CALCIUM 8.4*  --   MG  --  1.3*  AST 66*  --   ALT 42  --   ALKPHOS 369*  --   BILITOT 1.7*  --    ------------------------------------------------------------------------------------------------------------------  Cardiac Enzymes No results for input(s): TROPONINI in the last 168 hours. ------------------------------------------------------------------------------------------------------------------  RADIOLOGY:  No results found.  EKG:   None   IMPRESSION AND PLAN:   76 year old female with history of cholangiocarcinoma status post 2 biliary drains who presents from [facility] due to fever, tachycardia and tachypnea.  1. Sepsis  Patient presents with fever, leukocytosis, tachycardia and  tachypnea Blood cultures ordered Normal saline started Start Zosyn and VANCempirically ID consultation requested and discussed with Dr. Ola Spurr Consider CT scan of the abdomen (patient has CT last admission as well as ultrasound which did not show etiology of her sepsis) Culture of drainage from drain taken follow up on this.   2. Hypokalemia/hypopnea magnesium: Replete and recheck in a.m.  3. Cholangiocarcinoma: Oncology evaluation Patient has follow-up with her GI physician at [facility] on Friday for her biliary drains Follow bilirubin and alkaline phosphatase  4. Depression: Continue Effexor  5. Chronic hypoxic respiratory failure due to COPD without signs of exacerbation: Continue inhaler  6. Lupus: Continue Plaquenil  7. Essential hypertension: Continue Norvasc and metoprolol  8. GERD: Continue PPI  9. Diabetes: Sliding scale and ADA diet  10. Hyponatremia: From dehydration  IVF and repeat in am   All the records are reviewed and case discussed with ED provider. Management plans discussed with the patient and she is in agreement  CODE STATUS: DNR  TOTAL TIME TAKING CARE OF THIS PATIENT: 41 minutes.    Regina Hood M.D on 05/04/2017 at 4:12 PM  Between 7am to 6pm - Pager - [phone]  After 6pm go to [email or web address] - password [email or web address] [facility]  Office  [phone]  CC: Primary care physician; Idelle Crouch, MD

## 2017-05-04 NOTE — Progress Notes (Signed)
Pharmacy Antibiotic Note  Regina Hood is a 77 y.o. female admitted on 05/04/2017 with sepsis.  Pharmacy has been consulted for vancomycin and piperacillin/tazobactam dosing. Patient has PCN allergy listed with reaction as nausea.  Plan: Piperacillin/tazobactam 3.375 g IV q8h EI  Vancomycin 1000 mg IV once dose ordered this afternoon has not been charted. Will retime dose to be given NOW followed by vancomycin 1000 mg IV q8h (7 hour stack) Goal VT 15-20 mcg/mL  Kinetics: Adjusted body weight = 59 kg, CrCl 60 mL/min Ke: 0.054 Half-life: 13 hrs Vd: 41 L Cmin ~ 18 mcg/mL  Height: 5' 2"  (157.5 cm) Weight: 158 lb 1.6 oz (71.7 kg) IBW/kg (Calculated) : 50.1  Temp (24hrs), Avg:100 F (37.8 C), Min:98.9 F (37.2 C), Max:101 F (38.3 C)  Recent Labs  Lab 04/28/17 0638 04/29/17 0320 04/30/17 0518 05/04/17 1049 05/04/17 1209  WBC 10.2 8.6 6.7 13.2*  --   CREATININE 0.57 0.62 0.64 0.63  --   LATICACIDVEN  --   --   --   --  1.2    Estimated Creatinine Clearance: 55.4 mL/min (by C-G formula based on SCr of 0.63 mg/dL).    Allergies  Allergen Reactions  . Penicillins Nausea Only   Antimicrobials this admission: Piperacillin/tazobactam 1/16 >>  vancomycin 1/16 >>   Dose adjustments this admission:  Microbiology results: 1/16 BCx: Sent  Thank you for allowing pharmacy to be a part of this patient's care.  Lenis Noon, PharmD Clinical Pharmacist 05/04/2017 6:48 PM

## 2017-05-04 NOTE — Progress Notes (Signed)
Family Meeting Note  Advance Directive:yes  Today a meeting took place with the Patient.  The following clinical team members were present during this meeting:MD  The following were discussed:Patient's diagnosis: , Cholangiocarcinoma  Sepsis  Patient's progosis: Unable to determine and Goals for treatment: DNR  Additional follow-up to be provided: Would benefit from palliative care as an outpatient  Time spent during discussion: 16 minutes  Solace Manwarren, MD

## 2017-05-04 NOTE — Telephone Encounter (Signed)
PA on filed for Incruse, this is covered until 04/18/18. Nothing further needed

## 2017-05-04 NOTE — Progress Notes (Signed)
Per conversation with Dr. Tasia Catchings, patient to receive 40 meq of potassium today.

## 2017-05-05 ENCOUNTER — Other Ambulatory Visit: Payer: Self-pay

## 2017-05-05 LAB — COMPREHENSIVE METABOLIC PANEL
ALT: 42 U/L (ref 14–54)
ANION GAP: 9 (ref 5–15)
AST: 68 U/L — AB (ref 15–41)
Albumin: 2.5 g/dL — ABNORMAL LOW (ref 3.5–5.0)
Alkaline Phosphatase: 327 U/L — ABNORMAL HIGH (ref 38–126)
BUN: 10 mg/dL (ref 6–20)
CHLORIDE: 95 mmol/L — AB (ref 101–111)
CO2: 29 mmol/L (ref 22–32)
Calcium: 8.2 mg/dL — ABNORMAL LOW (ref 8.9–10.3)
Creatinine, Ser: 0.72 mg/dL (ref 0.44–1.00)
Glucose, Bld: 128 mg/dL — ABNORMAL HIGH (ref 65–99)
POTASSIUM: 3.3 mmol/L — AB (ref 3.5–5.1)
Sodium: 133 mmol/L — ABNORMAL LOW (ref 135–145)
TOTAL PROTEIN: 6.2 g/dL — AB (ref 6.5–8.1)
Total Bilirubin: 1.8 mg/dL — ABNORMAL HIGH (ref 0.3–1.2)

## 2017-05-05 LAB — CBC
HEMATOCRIT: 24.5 % — AB (ref 35.0–47.0)
HEMOGLOBIN: 8 g/dL — AB (ref 12.0–16.0)
MCH: 30.6 pg (ref 26.0–34.0)
MCHC: 32.8 g/dL (ref 32.0–36.0)
MCV: 93.4 fL (ref 80.0–100.0)
Platelets: 215 10*3/uL (ref 150–440)
RBC: 2.62 MIL/uL — AB (ref 3.80–5.20)
RDW: 17 % — ABNORMAL HIGH (ref 11.5–14.5)
WBC: 11.1 10*3/uL — ABNORMAL HIGH (ref 3.6–11.0)

## 2017-05-05 LAB — GLUCOSE, CAPILLARY
GLUCOSE-CAPILLARY: 107 mg/dL — AB (ref 65–99)
Glucose-Capillary: 106 mg/dL — ABNORMAL HIGH (ref 65–99)
Glucose-Capillary: 99 mg/dL (ref 65–99)

## 2017-05-05 LAB — MAGNESIUM: MAGNESIUM: 1.7 mg/dL (ref 1.7–2.4)

## 2017-05-05 LAB — PHOSPHORUS: Phosphorus: 3.3 mg/dL (ref 2.5–4.6)

## 2017-05-05 MED ORDER — POTASSIUM CHLORIDE 20 MEQ PO PACK
40.0000 meq | PACK | Freq: Once | ORAL | Status: AC
  Administered 2017-05-05: 10:00:00 40 meq via ORAL
  Filled 2017-05-05: qty 2

## 2017-05-05 MED ORDER — HEPARIN SOD (PORK) LOCK FLUSH 100 UNIT/ML IV SOLN: 500.0000 [IU] | Freq: Once | INTRAVENOUS | Status: DC

## 2017-05-05 MED ORDER — IPRATROPIUM-ALBUTEROL 0.5-2.5 (3) MG/3ML IN SOLN
3.0000 mL | Freq: Four times a day (QID) | RESPIRATORY_TRACT | Status: DC | PRN
  Administered 2017-05-05: 3 mL via RESPIRATORY_TRACT
  Filled 2017-05-05: qty 3

## 2017-05-05 MED ORDER — AMOXICILLIN-POT CLAVULANATE 875-125 MG PO TABS: 1.0000 | ORAL_TABLET | Freq: Two times a day (BID) | ORAL | 0 refills | Status: DC

## 2017-05-05 MED ORDER — HEPARIN SOD (PORK) LOCK FLUSH 100 UNIT/ML IV SOLN
INTRAVENOUS | Status: AC
  Filled 2017-05-05: qty 5

## 2017-05-05 MED ORDER — FUROSEMIDE 10 MG/ML IJ SOLN
40.0000 mg | Freq: Once | INTRAMUSCULAR | Status: AC
  Administered 2017-05-05: 01:00:00 40 mg via INTRAVENOUS
  Filled 2017-05-05: qty 4

## 2017-05-05 NOTE — Progress Notes (Signed)
ELECTROLYTE CONSULT NOTE - INITIAL   Pharmacy Consult for electrolyte monitoring/replacement Indication: hypokalemia, hypomagnesemia  Allergies  Allergen Reactions  . Penicillins Nausea Only    Patient Measurements:   Vital Signs: Temp: 98.5 F (36.9 C) (01/17 0418) Temp Source: Oral (01/17 0418) BP: 116/63 (01/17 0418) Pulse Rate: 109 (01/17 0418) Intake/Output from previous day: 01/16 0701 - 01/17 0700 In: 490 [P.O.:240; IV Piggyback:250] Out: 1050 [Urine:1050] Intake/Output from this shift: No intake/output data recorded.  Labs: Recent Labs    05/04/17 1049 05/04/17 1209 05/04/17 2058 05/05/17 0620  WBC 13.2*  --   --  11.1*  HGB 8.4*  --   --  8.0*  HCT 26.2*  --   --  24.5*  PLT 256  --   --  215  CREATININE 0.63  --  0.59 0.72  MG  --  1.3* 2.2 1.7  PHOS  --   --   --  3.3  ALBUMIN 2.6*  --  2.4* 2.5*  PROT 6.8  --  6.0* 6.2*  AST 66*  --  65* 68*  ALT 42  --  41 42  ALKPHOS 369*  --  332* 327*  BILITOT 1.7*  --  1.4* 1.8*   Estimated Creatinine Clearance: 54.9 mL/min (by C-G formula based on SCr of 0.72 mg/dL).  Assessment: Patient was directly admitted, pharmacy consulted to assist with electrolyte monitoring and replacement.   K=3.3 Phos=3.3 Mg=1.7  Pt received a dose of lasix very early this AM  Goal of Therapy: Electrolytes WNL  Plan:  I will give another 2g IV Mg once. Pt already has another 40 MEQ po of KCL ordered for 10am. I will continue with this order and recheck electrolytes in the AM  Ramond Dial, PharmD, BCPS Clinical Pharmacist 05/05/2017,7:21 AM

## 2017-05-05 NOTE — Discharge Summary (Signed)
[facility] [place] NAME: Regina Hood    MR#:  [id number]  DATE OF BIRTH:  1940-09-14  DATE OF ADMISSION:  05/04/2017 ADMITTING PHYSICIAN: Bettey Costa, MD  DATE OF DISCHARGE: No discharge date for patient encounter.  PRIMARY CARE PHYSICIAN: Idelle Crouch, MD    ADMISSION DIAGNOSIS:  sirs and hypokalemia  DISCHARGE DIAGNOSIS:  Active Problems:   Sepsis ([place])   SECONDARY DIAGNOSIS:   Past Medical History:  Diagnosis Date  . Arthritis   . Cancer ([place])   . Cellulitis   . Collagen vascular disease ([place])   . COPD (chronic obstructive pulmonary disease) ([place])   . DDD (degenerative disc disease), lumbar   . Depression   . Diabetes mellitus   . Diverticulitis   . GERD (gastroesophageal reflux disease)   . GI bleed   . Headache(784.0)   . Hypertension   . Lupus   . Ulcerative colitis [facility])     HOSPITAL COURSE:  77 year old female with history of cholangiocarcinoma status post 2 biliary drains who presents from [place] due to fever, tachycardia and tachypnea.  1.  Presumed sepsis   Resolved  Patient was admitted to regular nursing floor bed, treated with empiric Zosyn/vancomycin, in discussion with oncology/ Dr Tasia Catchings, okay to discharge patient to home with follow-up with [facility] oncology plan for on tomorrow on p.o. Augmentin  2. Hypokalemia/hypopnea magnesium Repleted  3. Cholangiocarcinoma Oncology did see pt while in house To f/u w/ GI physician at [facility] on tommorow given drain placement  DISCHARGE CONDITIONS:   On day of discharge patient is afebrile, hemodynamic stable, tolerating diet, ready for discharge home with appropriate follow-up with [facility] oncology on tomorrow, for more specific details please see chart  CONSULTS OBTAINED:  Treatment Team:  Leonel Ramsay, MD  DRUG ALLERGIES:   Allergies  Allergen Reactions  . Penicillins Nausea Only    DISCHARGE MEDICATIONS:   Allergies as of  05/05/2017      Reactions   Penicillins Nausea Only      Medication List    TAKE these medications   acetaminophen 500 MG tablet Commonly known as:  TYLENOL Take 1,000 mg by mouth 3 (three) times daily.   ADVAIR DISKUS 250-50 MCG/DOSE Aepb Generic drug:  Fluticasone-Salmeterol Inhale 1 puff into the lungs daily.   amLODipine 10 MG tablet Commonly known as:  NORVASC Take 1 tablet (10 mg total) by mouth daily.   amoxicillin-clavulanate 875-125 MG tablet Commonly known as:  AUGMENTIN Take 1 tablet by mouth every 12 (twelve) hours.   calcium carbonate 100 mg/ml Susp Take by mouth daily.   dexamethasone 1 MG tablet Commonly known as:  DECADRON Take 2 tablets (2 mg total) by mouth daily with breakfast. What changed:  how much to take   estradiol 1 MG tablet Commonly known as:  ESTRACE Take 1 mg by mouth daily.   gabapentin 100 MG capsule Commonly known as:  NEURONTIN Take 100 mg by mouth 3 (three) times daily.   lidocaine-prilocaine cream Commonly known as:  EMLA Apply 1 application topically as needed.   LORazepam 0.5 MG tablet Commonly known as:  ATIVAN Take 0.5 mg every 6 (six) hours as needed by mouth.   Medical Compression Stockings Misc 2 each by Does not apply route daily.   metFORMIN 500 MG tablet Commonly known as:  GLUCOPHAGE Take 500 mg by mouth 2 (two) times daily with a meal.   metoprolol tartrate 25 MG tablet Commonly  known as:  LOPRESSOR Take 1 tablet (25 mg total) by mouth 2 (two) times daily.   Normal Saline Flush 0.9 % Soln 1 mL by Other route every 8 (eight) hours. Flush each biliary drain with 1 mL twice daily   ondansetron 8 MG tablet Commonly known as:  ZOFRAN TAKE ONE TABLET EVERY 12 HOURS AS NEEDED   oxyCODONE 5 MG immediate release tablet Commonly known as:  Oxy IR/ROXICODONE Take 1 tablet (5 mg total) by mouth every 6 (six) hours as needed.   pantoprazole 40 MG tablet Commonly known as:  PROTONIX Take 40 mg by mouth daily.    PLAQUENIL 200 MG tablet Generic drug:  hydroxychloroquine Take 200 mg by mouth 2 (two) times daily.   polyethylene glycol powder powder Commonly known as:  GLYCOLAX/MIRALAX Take 0.5 Containers by mouth.   potassium chloride SA 20 MEQ tablet Commonly known as:  K-DUR,KLOR-CON Take 1 tablet (20 mEq total) by mouth daily as needed (low potassium).   prochlorperazine 10 MG tablet Commonly known as:  COMPAZINE Take 1 tablet (10 mg total) by mouth every 6 (six) hours as needed.   senna 8.6 MG Tabs tablet Commonly known as:  SENOKOT Take 2 tablets (17.2 mg total) by mouth daily. Hold for loose stools.   sodium chloride 0.9 % injection Flush each biliary drains (2) with 10 mL two times daily.   triamcinolone ointment 0.1 % Commonly known as:  KENALOG Apply 1 application topically 2 (two) times daily.   venlafaxine XR 75 MG 24 hr capsule Commonly known as:  EFFEXOR-XR Take 225 mg by mouth daily.   VITAMIN B-12 PO Take 1,000 mcg by mouth daily.   zolpidem 10 MG tablet Commonly known as:  AMBIEN Take 1 tablet (10 mg total) by mouth at bedtime as needed.        DISCHARGE INSTRUCTIONS:    If you experience worsening of your admission symptoms, develop shortness of breath, life threatening emergency, suicidal or homicidal thoughts you must seek medical attention immediately by calling 911 or calling your MD immediately  if symptoms less severe.  You Must read complete instructions/literature along with all the possible adverse reactions/side effects for all the Medicines you take and that have been prescribed to you. Take any new Medicines after you have completely understood and accept all the possible adverse reactions/side effects.   Please note  You were cared for by a hospitalist during your hospital stay. If you have any questions about your discharge medications or the care you received while you were in the hospital after you are discharged, you can call the unit and  asked to speak with the hospitalist on call if the hospitalist that took care of you is not available. Once you are discharged, your primary care physician will handle any further medical issues. Please note that NO REFILLS for any discharge medications will be authorized once you are discharged, as it is imperative that you return to your primary care physician (or establish a relationship with a primary care physician if you do not have one) for your aftercare needs so that they can reassess your need for medications and monitor your lab values.    Today   CHIEF COMPLAINT:  No chief complaint on file.   HISTORY OF PRESENT ILLNESS:  Regina Hood  is a 77 y.o. female with a known history of cholangiocarcinoma status post biliary tubes who was recently hospitalized due to sepsis of unclear etiology and sent home on oral Augmentin. She  went to [facility] today for follow-up and during this session it was noted she had fever, tachycardia and tachypnea. She was sent from Dr. Collie Siad office as a direct admit due to sepsis. Patient denies fevers at home, chills, abdominal pain, nausea, vomiting, diarrhea, shortness of breath or chest pain. Her biliary drains are draining normally.    VITAL SIGNS:  Blood pressure (!) 143/80, pulse 89, temperature 98.6 F (37 C), temperature source Oral, resp. rate 20, height 5' 2"  (1.575 m), weight 70 kg (154 lb 4.8 oz), SpO2 100 %.  I/O:    Intake/Output Summary (Last 24 hours) at 05/05/2017 1701 Last data filed at 05/05/2017 1500 Gross per 24 hour  Intake 780 ml  Output 1050 ml  Net -270 ml    PHYSICAL EXAMINATION:  GENERAL:  77 y.o.-year-old patient lying in the bed with no acute distress.  EYES: Pupils equal, round, reactive to light and accommodation. No scleral icterus. Extraocular muscles intact.  HEENT: Head atraumatic, normocephalic. Oropharynx and nasopharynx clear.  NECK:  Supple, no jugular venous distention. No thyroid enlargement, no  tenderness.  LUNGS: Normal breath sounds bilaterally, no wheezing, rales,rhonchi or crepitation. No use of accessory muscles of respiration.  CARDIOVASCULAR: S1, S2 normal. No murmurs, rubs, or gallops.  ABDOMEN: Soft, non-tender, non-distended. Bowel sounds present. No organomegaly or mass.  EXTREMITIES: No pedal edema, cyanosis, or clubbing.  NEUROLOGIC: Cranial nerves II through XII are intact. Muscle strength 5/5 in all extremities. Sensation intact. Gait not checked.  PSYCHIATRIC: The patient is alert and oriented x 3.  SKIN: No obvious rash, lesion, or ulcer.   DATA REVIEW:   CBC Recent Labs  Lab 05/05/17 0620  WBC 11.1*  HGB 8.0*  HCT 24.5*  PLT 215    Chemistries  Recent Labs  Lab 05/05/17 0620  NA 133*  K 3.3*  CL 95*  CO2 29  GLUCOSE 128*  BUN 10  CREATININE 0.72  CALCIUM 8.2*  MG 1.7  AST 68*  ALT 42  ALKPHOS 327*  BILITOT 1.8*    Cardiac Enzymes No results for input(s): TROPONINI in the last 168 hours.  Microbiology Results  Results for orders placed or performed during the hospital encounter of 05/04/17  Aerobic Culture (superficial specimen)     Status: None (Preliminary result)   Collection Time: 05/04/17  5:03 PM  Result Value Ref Range Status   Specimen Description   Final    ABDOMEN Performed at [facility], [address] [place], [place] [address]    Special Requests   Final    Immunocompromised Performed at [facility], [address] [place], [place] [address]    Gram Stain   Final    RARE WBC PRESENT, PREDOMINANTLY MONONUCLEAR NO ORGANISMS SEEN    Culture   Final    NO GROWTH < 24 HOURS Performed at [facility], [place] [address], [place], [place] [address]    Report Status PENDING  Incomplete  Aerobic Culture (superficial specimen)     Status: None (Preliminary result)   Collection Time: 05/04/17  5:03 PM  Result Value Ref Range Status   Specimen Description   Final    ABDOMEN Performed at  [facility], [address] [place], [place] [address]    Special Requests   Final    Immunocompromised Performed at [facility], [address] [place], [place] [address]    Gram Stain   Final    RARE WBC PRESENT, PREDOMINANTLY MONONUCLEAR  NO ORGANISMS SEEN Performed at [facility], [place] [address], [place], [place] [address]    Culture PENDING  Incomplete   Report Status PENDING  Incomplete    RADIOLOGY:  No results found.  EKG:   Orders placed or performed during the hospital encounter of 03/16/17  . ED EKG  . ED EKG  . EKG      Management plans discussed with the patient, family and they are in agreement.  CODE STATUS:     Code Status Orders  (From admission, onward)        Start     Ordered   05/04/17 1613  Do not attempt resuscitation (DNR)  Continuous    Question Answer Comment  In the event of cardiac or respiratory ARREST Do not call a "code blue"   In the event of cardiac or respiratory ARREST Do not perform Intubation, CPR, defibrillation or ACLS   In the event of cardiac or respiratory ARREST Use medication by any route, position, wound care, and other measures to relive pain and suffering. May use oxygen, suction and manual treatment of airway obstruction as needed for comfort.      05/04/17 1612    Code Status History    Date Active Date Inactive Code Status Order ID Comments User Context   05/04/2017 15:46 05/04/2017 16:12 Full Code [id number]  Bettey Costa, MD Inpatient   04/27/2017 18:26 04/30/2017 22:00 DNR [id number]  Vaughan Basta, MD Inpatient   03/16/2017 17:12 03/17/2017 16:08 DNR [id number]  Henreitta Leber, MD Inpatient   11/13/2016 03:01 11/14/2016 14:12 DNR [id number]  Harrie Foreman, MD Inpatient   06/29/2016 15:33 07/01/2016 19:22 DNR [id number]  Hillary Bow, MD ED   09/17/2011 14:43 09/19/2011 16:24 Full Code [id number]  Corum, Jeani Sow, RN Inpatient    Advance Directive Documentation     Most Recent  Value  Type of Advance Directive  Living will, Healthcare Power of Attorney  Pre-existing out of facility DNR order (yellow form or pink MOST form)  No data  "MOST" Form in Place?  No data      TOTAL TIME TAKING CARE OF THIS PATIENT: 45 minutes.    Avel Peace Nkechi Linehan M.D on 05/05/2017 at 5:01 PM  Between 7am to 6pm - Pager - [phone]  After 6pm go to [email or web address] - password [email or web address] [facility]  Office  [phone]  CC: Primary care physician; Idelle Crouch, MD   Note: This dictation was prepared with Dragon dictation along with smaller phrase technology. Any transcriptional errors that result from this process are unintentional.

## 2017-05-05 NOTE — Progress Notes (Signed)
[facility] [place] NAME: Regina Hood    MR#:  [id number]  DATE OF BIRTH:  [birth year]  SUBJECTIVE:  CHIEF COMPLAINT:  No chief complaint on file. no events overnight, pt feeling better  REVIEW OF SYSTEMS:  CONSTITUTIONAL: No fever, fatigue or weakness.  EYES: No blurred or double vision.  EARS, NOSE, AND THROAT: No tinnitus or ear pain.  RESPIRATORY: No cough, shortness of breath, wheezing or hemoptysis.  CARDIOVASCULAR: No chest pain, orthopnea, edema.  GASTROINTESTINAL: No nausea, vomiting, diarrhea or abdominal pain.  GENITOURINARY: No dysuria, hematuria.  ENDOCRINE: No polyuria, nocturia,  HEMATOLOGY: No anemia, easy bruising or bleeding SKIN: No rash or lesion. MUSCULOSKELETAL: No joint pain or arthritis.   NEUROLOGIC: No tingling, numbness, weakness.  PSYCHIATRY: No anxiety or depression.   ROS  DRUG ALLERGIES:   Allergies  Allergen Reactions  . Penicillins Nausea Only    VITALS:  Blood pressure 116/63, pulse (!) 109, temperature 98.5 F (36.9 C), temperature source Oral, resp. rate 14, height 5' 2"  (1.575 m), weight 70 kg (154 lb 4.8 oz), SpO2 98 %.  PHYSICAL EXAMINATION:  GENERAL:  77 y.o.-year-old patient lying in the bed with no acute distress.  EYES: Pupils equal, round, reactive to light and accommodation. No scleral icterus. Extraocular muscles intact.  HEENT: Head atraumatic, normocephalic. Oropharynx and nasopharynx clear.  NECK:  Supple, no jugular venous distention. No thyroid enlargement, no tenderness.  LUNGS: Normal breath sounds bilaterally, no wheezing, rales,rhonchi or crepitation. No use of accessory muscles of respiration.  CARDIOVASCULAR: S1, S2 normal. No murmurs, rubs, or gallops.  ABDOMEN: Soft, nontender, nondistended. Bowel sounds present. No organomegaly or mass.  EXTREMITIES: No pedal edema, cyanosis, or clubbing.  NEUROLOGIC: Cranial nerves II through XII are intact. MAES. Gait not checked.   PSYCHIATRIC: The patient is alert and oriented x 3.  SKIN: Diffuse erythematous body rash-secondary to lupus    Physical Exam LABORATORY PANEL:   CBC Recent Labs  Lab 05/05/17 0620  WBC 11.1*  HGB 8.0*  HCT 24.5*  PLT 215   ------------------------------------------------------------------------------------------------------------------  Chemistries  Recent Labs  Lab 05/05/17 0620  NA 133*  K 3.3*  CL 95*  CO2 29  GLUCOSE 128*  BUN 10  CREATININE 0.72  CALCIUM 8.2*  MG 1.7  AST 68*  ALT 42  ALKPHOS 327*  BILITOT 1.8*   ------------------------------------------------------------------------------------------------------------------  Cardiac Enzymes No results for input(s): TROPONINI in the last 168 hours. ------------------------------------------------------------------------------------------------------------------  RADIOLOGY:  No results found.  ASSESSMENT AND PLAN:  77 year old female with history of cholangiocarcinoma status post 2 biliary drains who presents from [facility] due to fever, tachycardia, and tachypnea concerning for sepsis given immunocompromised state from cancer and lupus.  1 acute septicemia Etiology unknown Continue empiric Zosyn/vancomycin, follow-up on cultures, IV fluids for rehydration, infectious disease/Dr. for general input greatly appreciated, CT last admission as well as ultrasound did not show etiology of sepsis -patient not interested in having repeat CT abdomen study done given concern for cost  2 acute hypokalemia Replete with p.o. potassium and check BMP in the morning  3 chronic Cholangiocarcinoma S/p 2 biliary drains On chemo, oncology input greatly appreciated,  Patient has f/u w/ her GI physician at [facility] on Friday for her biliary drains Continue to follow bilirubin and alkaline phosphatase-stable  4 chronic depression Stable on Effexor  5 chronic hypoxic respiratory failure due to COPD without signs of  exacerbation Stable BTs prn  6 chronic Lupus Stable on Plaquenil  7 chronic benign essential hypertension Stable on Norvasc and metoprolol  8 chronic GERD without esophagitis Stable PPI daily  9 chronic  DM, II Stable  SSI with Accu-Cheks per routine   10 acute hyponatremia Resolving with IV fluids for rehydration Repeat BMP in the morning  All the records are reviewed and case discussed with Care Management/Social Workerr. Management plans discussed with the patient, family and they are in agreement.  CODE STATUS: dnr  TOTAL TIME TAKING CARE OF THIS PATIENT: 35 minutes.   POSSIBLE D/C IN 2-3 DAYS, DEPENDING ON CLINICAL CONDITION.   Regina Hood M.D on 05/05/2017   Between 7am to 6pm - Pager - [phone]  After 6pm go to [email or web address] - password [email or web address] [facility]  Office  [phone]  CC: Primary care physician; Idelle Crouch, MD  Note: This dictation was prepared with Dragon dictation along with smaller phrase technology. Any transcriptional errors that result from this process are unintentional.

## 2017-05-05 NOTE — Progress Notes (Signed)
Discussed with patient's [facility] Oncologist Dr.Hsu. Patient is currently admitted for suspected sepsis, blood culture pending, no growth in 24 hours. Dr.Hsu can see patient on 05/05/2017 at 9:00AM and evaluate patient to see if her external biliary drain can be removed and also potentially admit patient for additional IV antibiotics. IF patient feels well this evening, she can go home with oral antibiotics after pm dose of IV antibiotics. Plan was discussed with patient.  Plan was discussed with primary team Dr.Salary.

## 2017-05-05 NOTE — Progress Notes (Signed)
Discharge instructions given and went over with patient at bedside. All questions answered. Patient discharged home with son via wheelchair by nursing staff. Madlyn Frankel, RN

## 2017-05-06 DIAGNOSIS — Z7984 Long term (current) use of oral hypoglycemic drugs: Secondary | ICD-10-CM | POA: Diagnosis not present

## 2017-05-06 DIAGNOSIS — D869 Sarcoidosis, unspecified: Secondary | ICD-10-CM | POA: Diagnosis not present

## 2017-05-06 DIAGNOSIS — R768 Other specified abnormal immunological findings in serum: Secondary | ICD-10-CM | POA: Diagnosis not present

## 2017-05-06 DIAGNOSIS — Z7951 Long term (current) use of inhaled steroids: Secondary | ICD-10-CM | POA: Diagnosis not present

## 2017-05-06 DIAGNOSIS — K5190 Ulcerative colitis, unspecified, without complications: Secondary | ICD-10-CM | POA: Diagnosis not present

## 2017-05-06 DIAGNOSIS — J449 Chronic obstructive pulmonary disease, unspecified: Secondary | ICD-10-CM | POA: Diagnosis not present

## 2017-05-06 DIAGNOSIS — Z79899 Other long term (current) drug therapy: Secondary | ICD-10-CM | POA: Diagnosis not present

## 2017-05-06 DIAGNOSIS — D72829 Elevated white blood cell count, unspecified: Secondary | ICD-10-CM | POA: Diagnosis not present

## 2017-05-06 DIAGNOSIS — M329 Systemic lupus erythematosus, unspecified: Secondary | ICD-10-CM | POA: Diagnosis not present

## 2017-05-06 DIAGNOSIS — K219 Gastro-esophageal reflux disease without esophagitis: Secondary | ICD-10-CM | POA: Diagnosis not present

## 2017-05-06 DIAGNOSIS — Z66 Do not resuscitate: Secondary | ICD-10-CM | POA: Diagnosis not present

## 2017-05-06 DIAGNOSIS — C249 Malignant neoplasm of biliary tract, unspecified: Secondary | ICD-10-CM | POA: Diagnosis not present

## 2017-05-06 DIAGNOSIS — R918 Other nonspecific abnormal finding of lung field: Secondary | ICD-10-CM | POA: Diagnosis not present

## 2017-05-06 DIAGNOSIS — C221 Intrahepatic bile duct carcinoma: Secondary | ICD-10-CM | POA: Diagnosis not present

## 2017-05-06 DIAGNOSIS — I10 Essential (primary) hypertension: Secondary | ICD-10-CM | POA: Diagnosis not present

## 2017-05-06 DIAGNOSIS — Z8619 Personal history of other infectious and parasitic diseases: Secondary | ICD-10-CM | POA: Diagnosis not present

## 2017-05-06 DIAGNOSIS — Z9981 Dependence on supplemental oxygen: Secondary | ICD-10-CM | POA: Diagnosis not present

## 2017-05-06 DIAGNOSIS — K529 Noninfective gastroenteritis and colitis, unspecified: Secondary | ICD-10-CM | POA: Diagnosis not present

## 2017-05-06 DIAGNOSIS — Z87891 Personal history of nicotine dependence: Secondary | ICD-10-CM | POA: Diagnosis not present

## 2017-05-06 DIAGNOSIS — K8309 Other cholangitis: Secondary | ICD-10-CM | POA: Diagnosis not present

## 2017-05-06 DIAGNOSIS — E119 Type 2 diabetes mellitus without complications: Secondary | ICD-10-CM | POA: Diagnosis not present

## 2017-05-06 DIAGNOSIS — M810 Age-related osteoporosis without current pathological fracture: Secondary | ICD-10-CM | POA: Diagnosis not present

## 2017-05-06 DIAGNOSIS — J9611 Chronic respiratory failure with hypoxia: Secondary | ICD-10-CM | POA: Diagnosis not present

## 2017-05-07 LAB — AEROBIC CULTURE  (SUPERFICIAL SPECIMEN)

## 2017-05-07 LAB — AEROBIC CULTURE W GRAM STAIN (SUPERFICIAL SPECIMEN)

## 2017-05-09 ENCOUNTER — Telehealth: Payer: Self-pay | Admitting: Pharmacist

## 2017-05-09 LAB — CULTURE, BLOOD (ROUTINE X 2)
Culture: NO GROWTH
Culture: NO GROWTH
SPECIAL REQUESTS: ADEQUATE
Special Requests: ADEQUATE

## 2017-05-09 NOTE — Patient Outreach (Signed)
[facility] [place]) Care Management  05/09/2017   MARLENY FALLER November 05, 1940 [id number]  Subjective:  Regina Hood is a pleasant 77 y/o female who was recently discharged from [facility] for sepsis, discharged on Augmentin. PMH significant for cholangiocarcinoma s/p 2 biliary drains. She reports she just got back from [facility] yesterday for follow up for possible infected drains. Per patient, drains were removed and a bag was applied to collect biliary fluid. She was also switched to Cipro and told to stop taking dexamethasone.   Objective:   Current Medications:  Current Outpatient Medications  Medication Sig Dispense Refill  . acetaminophen (TYLENOL) 500 MG tablet Take 1,000 mg by mouth 3 (three) times daily.    Marland Kitchen amLODipine (NORVASC) 10 MG tablet Take 1 tablet (10 mg total) by mouth daily. 30 tablet 0  . ciprofloxacin (CIPRO) 500 MG tablet Take 500 mg by mouth 2 (two) times daily.    . Cyanocobalamin (VITAMIN B-12 PO) Take 1,000 mcg by mouth daily.    Regino Schultze Bandages & Supports (MEDICAL COMPRESSION STOCKINGS) MISC 2 each by Does not apply route daily. 2 each 1  . estradiol (ESTRACE) 1 MG tablet Take 1 mg by mouth daily.    . Fluticasone-Salmeterol (ADVAIR DISKUS) 250-50 MCG/DOSE AEPB Inhale 1 puff into the lungs daily.     Marland Kitchen gabapentin (NEURONTIN) 100 MG capsule Take 100 mg by mouth 2 (two) times daily.     . hydroxychloroquine (PLAQUENIL) 200 MG tablet Take 200 mg by mouth 2 (two) times daily.     . metFORMIN (GLUCOPHAGE) 500 MG tablet Take 500 mg by mouth 2 (two) times daily with a meal.    . metoprolol tartrate (LOPRESSOR) 25 MG tablet Take 1 tablet (25 mg total) by mouth 2 (two) times daily. 60 tablet 0  . ondansetron (ZOFRAN) 8 MG tablet TAKE ONE TABLET EVERY 12 HOURS AS NEEDED 30 tablet 3  . oxyCODONE (OXY IR/ROXICODONE) 5 MG immediate release tablet Take 1 tablet (5 mg total) by mouth every 6 (six) hours as needed. 120 tablet 0  . pantoprazole (PROTONIX) 40 MG  tablet Take 40 mg by mouth daily.    . potassium chloride SA (K-DUR,KLOR-CON) 20 MEQ tablet Take 1 tablet (20 mEq total) by mouth daily as needed (low potassium). 30 tablet 0  . prochlorperazine (COMPAZINE) 10 MG tablet Take 1 tablet (10 mg total) by mouth every 6 (six) hours as needed. 30 tablet 3  . senna (SENOKOT) 8.6 MG TABS tablet Take 2 tablets (17.2 mg total) by mouth daily. Hold for loose stools. 120 each 3  . sodium chloride 0.9 % injection Flush each biliary drains (2) with 10 mL two times daily.    . Sodium Chloride Flush (NORMAL SALINE FLUSH) 0.9 % SOLN 1 mL by Other route every 8 (eight) hours. Flush each biliary drain with 1 mL twice daily 84 Syringe 3  . venlafaxine XR (EFFEXOR-XR) 75 MG 24 hr capsule Take 225 mg by mouth daily.     . calcium carbonate 100 mg/ml SUSP Take by mouth daily.     Marland Kitchen lidocaine-prilocaine (EMLA) cream Apply 1 application topically as needed. (Patient not taking: Reported on 05/09/2017) 30 g 3  . LORazepam (ATIVAN) 0.5 MG tablet Take 0.5 mg every 6 (six) hours as needed by mouth.  0  . polyethylene glycol powder (GLYCOLAX/MIRALAX) powder Take 0.5 Containers by mouth.     . zolpidem (AMBIEN) 10 MG tablet Take 1 tablet (10 mg total) by mouth at  bedtime as needed. 30 tablet 3   No current facility-administered medications for this visit.    Facility-Administered Medications Ordered in Other Visits  Medication Dose Route Frequency Provider Last Rate Last Dose  . 0.9 %  sodium chloride infusion   Intravenous PRN Earlie Server, MD   Stopped at 04/27/17 1400  . heparin lock flush 100 unit/mL  500 Units Intravenous Once Earlie Server, MD        Functional Status:  In your present state of health, do you have any difficulty performing the following activities: 05/04/2017 04/27/2017  Hearing? N N  Vision? N N  Difficulty concentrating or making decisions? N N  Walking or climbing stairs? N N  Comment - -  Dressing or bathing? N N  Doing errands, shopping? N N  Some  recent data might be hidden    Fall/Depression Screening: Fall Risk  07/09/2016  Falls in the past year? No   PHQ 2/9 Scores 07/09/2016  PHQ - 2 Score 0    ASSESSMENT: Date Discharged from Hospital: 05/05/2017 Date Medication Reconciliation Performed: 05/09/2017  New Medications at Discharge:    Augmentin 875/125 mg BID x 7 days  Patient was recently discharged from hospital and all medications have been reviewed.   Drugs sorted by system:  Neurologic/Psychologic: - gabapentin 100 mg BID - venlafaxine XR 75 mg daily - zolpidem 10 mg HS prn - lorazepam 0.5 mg q6h prn  Cardiovascular: - amlodipine 10 mg daily - metoprolol tartrate 25 mg BID  Pulmonary/Allergy: - Advair 250/50 once daily  Gastrointestinal: - docusate/senna - pantoprazole 40 mg qAM - ondansetron 8 mg q12h prn - prochlorperazine 10 mg q6h prn  Endocrine: - metformin 500 mg BID  - estradiol 1 mg daily  Renal: n/a  Topical: n/a  Pain: - acetaminophen 1000 mg TID prn - oxycodone 5 mg q6 hrs PRN (uses very rarely)  Vitamins/Minerals: - cyanocobalamin 1000 mcg daily - potassium chloride 20 mEq daily  Infectious Diseases: - cipro 500 mg BID  Miscellaneous: - hydroxychloroquine 828 mg BID  Duplications in therapy: none Gaps in therapy: no rescue inhaler or LAMA with COPD dx Medications to avoid in the elderly: lorazepam, zolpidem Drug interactions: no significant interactions Other issues noted: Advair was being used PRN. Dexamethasone and Augmentin discontinued by [place] center per patient, started Cipro   PLAN: - Instructed patient to take Cipro as prescribed by [place] center, stop taking Augmentin and continue all other medications as prescribed - Zolpidem dose is high for female patient - max dose is 5 mg - Educated patient to use Advair every day as a long-term controller rather than using PRN for shortness of breath - Patient has history of COPD, a LAMA (ie, Spiriva) may be  better choice of controller medication. She also does not currently have a prescription for a rescue inhaler (albuterol)    Charlene Brooke, PharmD PGY1 Pharmacy Resident Email: [email or web address] Pager: [phone]

## 2017-05-11 ENCOUNTER — Inpatient Hospital Stay: Payer: PPO

## 2017-05-11 ENCOUNTER — Telehealth: Payer: Self-pay | Admitting: *Deleted

## 2017-05-11 DIAGNOSIS — Z8619 Personal history of other infectious and parasitic diseases: Secondary | ICD-10-CM | POA: Diagnosis not present

## 2017-05-11 DIAGNOSIS — R21 Rash and other nonspecific skin eruption: Secondary | ICD-10-CM | POA: Diagnosis not present

## 2017-05-11 DIAGNOSIS — C221 Intrahepatic bile duct carcinoma: Secondary | ICD-10-CM | POA: Diagnosis not present

## 2017-05-11 DIAGNOSIS — R0602 Shortness of breath: Secondary | ICD-10-CM | POA: Diagnosis not present

## 2017-05-11 DIAGNOSIS — Z8719 Personal history of other diseases of the digestive system: Secondary | ICD-10-CM | POA: Diagnosis not present

## 2017-05-11 DIAGNOSIS — D72828 Other elevated white blood cell count: Secondary | ICD-10-CM | POA: Diagnosis not present

## 2017-05-11 DIAGNOSIS — Z79899 Other long term (current) drug therapy: Secondary | ICD-10-CM | POA: Diagnosis not present

## 2017-05-11 DIAGNOSIS — Z9221 Personal history of antineoplastic chemotherapy: Secondary | ICD-10-CM | POA: Diagnosis not present

## 2017-05-11 DIAGNOSIS — R5383 Other fatigue: Secondary | ICD-10-CM | POA: Diagnosis not present

## 2017-05-11 DIAGNOSIS — R509 Fever, unspecified: Secondary | ICD-10-CM | POA: Diagnosis not present

## 2017-05-11 DIAGNOSIS — R000 Tachycardia, unspecified: Secondary | ICD-10-CM | POA: Diagnosis not present

## 2017-05-11 DIAGNOSIS — R630 Anorexia: Secondary | ICD-10-CM | POA: Diagnosis not present

## 2017-05-11 DIAGNOSIS — D696 Thrombocytopenia, unspecified: Secondary | ICD-10-CM | POA: Diagnosis not present

## 2017-05-11 DIAGNOSIS — D6481 Anemia due to antineoplastic chemotherapy: Secondary | ICD-10-CM | POA: Diagnosis not present

## 2017-05-11 DIAGNOSIS — R739 Hyperglycemia, unspecified: Secondary | ICD-10-CM | POA: Diagnosis not present

## 2017-05-11 DIAGNOSIS — Z9689 Presence of other specified functional implants: Secondary | ICD-10-CM | POA: Diagnosis not present

## 2017-05-11 DIAGNOSIS — Z7984 Long term (current) use of oral hypoglycemic drugs: Secondary | ICD-10-CM | POA: Diagnosis not present

## 2017-05-11 DIAGNOSIS — R112 Nausea with vomiting, unspecified: Secondary | ICD-10-CM | POA: Diagnosis not present

## 2017-05-11 DIAGNOSIS — I10 Essential (primary) hypertension: Secondary | ICD-10-CM | POA: Diagnosis not present

## 2017-05-11 DIAGNOSIS — Z87891 Personal history of nicotine dependence: Secondary | ICD-10-CM | POA: Diagnosis not present

## 2017-05-11 DIAGNOSIS — R531 Weakness: Secondary | ICD-10-CM | POA: Diagnosis not present

## 2017-05-11 LAB — CBC WITH DIFFERENTIAL/PLATELET
BASOS ABS: 0.1 10*3/uL (ref 0–0.1)
BASOS PCT: 1 %
EOS ABS: 0.1 10*3/uL (ref 0–0.7)
EOS PCT: 2 %
HCT: 24.7 % — ABNORMAL LOW (ref 35.0–47.0)
HEMOGLOBIN: 8 g/dL — AB (ref 12.0–16.0)
Lymphocytes Relative: 10 %
Lymphs Abs: 0.8 10*3/uL — ABNORMAL LOW (ref 1.0–3.6)
MCH: 30.1 pg (ref 26.0–34.0)
MCHC: 32.3 g/dL (ref 32.0–36.0)
MCV: 92.9 fL (ref 80.0–100.0)
Monocytes Absolute: 1.2 10*3/uL — ABNORMAL HIGH (ref 0.2–0.9)
Monocytes Relative: 15 %
NEUTROS PCT: 72 %
Neutro Abs: 5.6 10*3/uL (ref 1.4–6.5)
PLATELETS: 243 10*3/uL (ref 150–440)
RBC: 2.66 MIL/uL — AB (ref 3.80–5.20)
RDW: 16.5 % — ABNORMAL HIGH (ref 11.5–14.5)
WBC: 7.7 10*3/uL (ref 3.6–11.0)

## 2017-05-11 LAB — COMPREHENSIVE METABOLIC PANEL
ALBUMIN: 2.5 g/dL — AB (ref 3.5–5.0)
ALT: 30 U/L (ref 14–54)
AST: 60 U/L — AB (ref 15–41)
Alkaline Phosphatase: 287 U/L — ABNORMAL HIGH (ref 38–126)
Anion gap: 11 (ref 5–15)
BUN: 11 mg/dL (ref 6–20)
CHLORIDE: 101 mmol/L (ref 101–111)
CO2: 24 mmol/L (ref 22–32)
Calcium: 8.2 mg/dL — ABNORMAL LOW (ref 8.9–10.3)
Creatinine, Ser: 1.01 mg/dL — ABNORMAL HIGH (ref 0.44–1.00)
GFR calc non Af Amer: 53 mL/min — ABNORMAL LOW (ref >60)
GLUCOSE: 98 mg/dL (ref 65–99)
Potassium: 3.6 mmol/L (ref 3.5–5.1)
SODIUM: 136 mmol/L (ref 135–145)
Total Bilirubin: 1.2 mg/dL (ref 0.3–1.2)
Total Protein: 6.5 g/dL (ref 6.5–8.1)

## 2017-05-11 NOTE — Telephone Encounter (Signed)
Per VO Dr Tasia Catchings check CBC and CMP patient accepts appointment at [facility]

## 2017-05-11 NOTE — Telephone Encounter (Signed)
Patient reports she was released form [facility] Sunday and sent home with a bili bag; she is having problems and called [facility] who wants her to have labs drawn today. She is asking if will will order a CMP for her. Please advise

## 2017-05-12 ENCOUNTER — Telehealth: Payer: Self-pay | Admitting: Oncology

## 2017-05-13 ENCOUNTER — Inpatient Hospital Stay: Payer: PPO

## 2017-05-13 ENCOUNTER — Other Ambulatory Visit: Payer: Self-pay | Admitting: *Deleted

## 2017-05-13 VITALS — BP 125/73 | HR 102 | Temp 96.7°F | Resp 20

## 2017-05-13 DIAGNOSIS — E860 Dehydration: Secondary | ICD-10-CM

## 2017-05-13 DIAGNOSIS — C221 Intrahepatic bile duct carcinoma: Secondary | ICD-10-CM | POA: Diagnosis not present

## 2017-05-13 MED ORDER — HEPARIN SOD (PORK) LOCK FLUSH 100 UNIT/ML IV SOLN
500.0000 [IU] | Freq: Once | INTRAVENOUS | Status: AC
  Administered 2017-05-13: 500 [IU] via INTRAVENOUS
  Filled 2017-05-13: qty 5

## 2017-05-13 MED ORDER — SODIUM CHLORIDE 0.9% FLUSH
10.0000 mL | Freq: Once | INTRAVENOUS | Status: AC
  Administered 2017-05-13: 10 mL via INTRAVENOUS
  Filled 2017-05-13: qty 10

## 2017-05-13 MED ORDER — SODIUM CHLORIDE 0.9 % IV SOLN
Freq: Once | INTRAVENOUS | Status: DC
  Administered 2017-05-13: 14:00:00 via INTRAVENOUS
  Filled 2017-05-13: qty 1000

## 2017-05-13 NOTE — Patient Outreach (Signed)
[facility]) Care Management  05/13/2017  Regina Hood Dec 24, 1940 [id number]   Transition of Care Referral  Referral Date: 05/13/17 Referral Source: [facility] Urgent Outreach [facility] Date of Discharge:05/08/17 Facility: [place] Discharge Diagnosis: Cholangiocarcinoma of biliary tract Insurance:  Outreach attempt #1 to patient. No answer. RN CM left HIPAA compliant message along with contact info.    Plan: RN CM will contact patient within one week.    Lake Bells, RN, BSN, MHA/MSL, [place] Telephonic Care Manager Coordinator [facility] Direct Phone: [phone] Toll Free: [phone] Fax: [phone]

## 2017-05-15 NOTE — Progress Notes (Signed)
Hematology/Oncology Follow up note [facility] [phone] [phone]  Patient Care Team: Idelle Crouch, MD as PCP - General (Internal Medicine) Avel Peace, Ree Kida, RN as [place] Management REASON FOR VISIT Follow up for treatment of cholangiocarcinoma  PERTINENT ONCOLOGY HISTORY Regina Hood 77 y.o.  female with PMH listed as below, most significantly for cholangiocarcinoma who has been on chemotherapy treatment presents to transfer her cancer care to our cancer center.  After extensive medical record review, summary of her diagnosis and treatment history are listed below.  1. Initially presented in 07/2016 to an outside ED with lower and upper GI bleed.  A. She was found to have new LFTs elevations including AST 340, ALT 379, Alk phos 420 and t bili 0.9.   B. [place] was obtained, which did not visualize the left lobe.  C. She was evaluated by gastroenterology because of her history of UC and underwent colonoscopy, which was unremarkable per path report.  2. 08/20/16, CT noted diffuse atrophy of the left lobe with left intrahepatic biliary ductal dilatation, with 4.1 cm ill-defined low-attenuation mass. This mass was suspicious for malignancy.  3. 08/2016, MRI/MRCP which showed abnormal left hepatic lobe with diffuse biliary dilatation with a shrunken left hepatic lobe and a 2.42.9 cm oval-shaped masslike component displacing the adjacent bile ducts. There is an approximately 1.6 cm segment truncated bile ducts involving common hepatic duct and right and left hepatic although no dilatation of the right hepatic duct system was seen.   4. 09/02/16, ERCP showed localized biliary stricture in the left intrahepatic duct with dilation of the left intrahepatic branches. Sphincterotomy was performed and a stent was placed. EUS with a 3.3 cm mass and left duct dilation.  5. Repeat ERCP was done 09/10/16, during which the stent was  exchanged.  6. 09/20/16, taken to OR and noted to a have an peritoneal implant with biopsy c/w adenocarcinoma. The neoplastic cells are positive for CK7. They are negative for CK20, CDX-2, TTF-1, Napsin A, hep-par1, glypican 3, and arginase. The immunohistochemical features are consistent with adenocarcinoma of upper gastrointestinal tract or pancreaticobiliary origin.  7. 09/28/16, initially seen in medical oncology clinic by Dr. Filomena Jungling 8. 09/29/16, admitted for biliary obstruction and cholangitis, s/p PBD placement, d/c 10/06/16 9. 10/12/16, initiated on gemcitabine (400 mg/m2). CA 19-9 167 10. 10/26/16, increased gemcitabine (800 mg/m2) 11. 11/23/2016 Cycle 3 Gemzar (834m/m2), 12/07/2016, Gemzar (8040mm2) added cisplatin 2514m2  12. 12/14/2016, CT CAP shows slight interval decrease in size of L hepatic lobe cholangio. Slight interval increase in size of porta hepatis lymph node whiel other nodes are slightly smaller. Multiple bilateral thyroid nodules. Stable mediastinal lymph nodes. CA 19-9 60 14  8/30 Cycle 4 Gemzar (800m46m) added cisplatin 20mg47m 15, 12/28/2016 Gemcitabine 800mg/70mcisplatin was hold due to anemia. S/p 1 PRBC transfusion.   [name] [name]    She forgot her appointment on day 8 and Gemcitabine 800mg/m64md cisplatin 20mg/m234m given  Day 9 of cycle 4 (01/05/2017).        01/19/2017  Cycle 5 Day 1Gemcitabine 800mg/m2,39mplatin 20mg/m2, 51munit blood transfusion.        01/26/2017 Cycle 5 Day 8 Gemcitabine 800mg/m2, c8matin 20mg/m2    29mShe can not tolerate Gemcitabine and Cisplatin due to cytopenia and profound weakness. She was continued on Gemcitabine  single agent. Last chemotherapy was 12/19.    16 She has been chronically on dexamethasone since she started her treatment in [place]. She was originally on 4 mg of dexamethasone when she transition her care to me. I have weaned down to 1 mg, however her skin rash got  worse. After recent hospitalization at [facility], the 90m Dexamethasone was discontinued after discharge.   17 [facility] She spiked fever and had hyperbilirubinemia and was treated for possible infection. Her blood culture was negative for multiple times. She was seen and evaluated by   [name] with above history reviewed by me today who [facility]  Patient's PPD has been converted to externally drained and connected to a back.  [facility] she has felt not enough drainage and also abdominal pain.  She called [facility] and was instructed to have labs done locally and if no rising of bilirubin level, no intervention was needed.  She has had labs done which reviewed normal bilirubin level.  She appears dehydrated and she received a hydration session. Today she presents for follow-up.  Her biliary drains have about 10-20 cc/day output, draining well since she was instructed to flush it.  She reports feeling profoundly weak and shaky, and has lost appetite since discharge.  Denies any fever or chills. She was taking 1 mg of dexamethasone and [facility]  She reports feeling nauseated and takes Zofran as needed with some relief.   Review of Systems  Constitutional: Positive for fatigue and fever. Negative for appetite change, chills and diaphoresis.  HENT:   Negative for hearing loss and nosebleeds.   Eyes: Negative for eye problems and icterus.  Respiratory: Negative for cough, hemoptysis and shortness of breath.        Use oxygen as needed at home.  Cardiovascular: Negative for chest pain and leg swelling.  Gastrointestinal: Negative for abdominal pain, blood in stool, constipation and diarrhea.  Endocrine: Negative for hot flashes.  Genitourinary: Negative for difficulty  urinating, dysuria and nocturia.   Musculoskeletal: Negative for arthralgias, back pain and flank pain.  Skin: Positive for rash. Negative for itching.       Skin inflammation at the suture site of percutaneous bile ducts drain.   Neurological: Negative for dizziness and headaches.  Hematological: Negative for adenopathy.  Psychiatric/Behavioral: Negative for confusion and depression. The patient is not nervous/anxious.    MEDICAL HISTORY:  Past Medical History:  Diagnosis Date  . Arthritis   . Cancer ([place]   . Cellulitis   . Collagen vascular disease ([facility] [place]   . COPD (chronic obstructive pulmonary disease) ([place]   . DDD (degenerative disc disease), lumbar   . Depression   . Diabetes mellitus   . Diverticulitis   . GERD (gastroesophageal reflux disease)   . GI bleed   . Headache(784.0)   . Hypertension   . Lupus   . Ulcerative colitis ([place]     SURGICAL HISTORY: Past Surgical History:  Procedure Laterality Date  . ABDOMINAL HYSTERECTOMY    . BREAST BIOPSY Right   . COLONOSCOPY    . COLONOSCOPY WITH PROPOFOL N/A 08/18/2016   Procedure: COLONOSCOPY WITH PROPOFOL;  Surgeon: RManya Silvas MD;  [facility];  Service: Endoscopy;  Laterality: N/A;  . ESOPHAGOGASTRODUODENOSCOPY (EGD) WITH PROPOFOL N/A 08/18/2016  Procedure: ESOPHAGOGASTRODUODENOSCOPY (EGD) WITH PROPOFOL;  Surgeon: Manya Silvas, MD;  [facility];  Service: Endoscopy;  Laterality: N/A;  . ORIF HUMERUS FRACTURE  09/17/2011   Procedure: OPEN REDUCTION INTERNAL FIXATION (ORIF) PROXIMAL HUMERUS FRACTURE;  Surgeon: Augustin Schooling, MD;  Location: [place];  Service: Orthopedics;  Laterality: Left;    SOCIAL HISTORY: Social History   Socioeconomic History  . Marital status: Widowed    Spouse name: Not on file  . Number of children: Not on file  . Years of education: Not on file  . Highest education level: Not on file  Social Needs  . Financial resource strain: Not on file  . Food  insecurity - worry: Not on file  . Food insecurity - inability: Not on file  . Transportation needs - medical: Not on file  . Transportation needs - non-medical: Not on file  Occupational History  . Not on file  Tobacco Use  . Smoking status: Former Smoker    Packs/day: 1.00    Years: 10.00    Pack years: 10.00    Types: Cigarettes    Last attempt to quit: 12/30/1998    Years since quitting: 18.3  . Smokeless tobacco: Never Used  . Tobacco comment: quit smoking in 2003  Substance and Sexual Activity  . Alcohol use: No  . Drug use: No  . Sexual activity: Not on file  Other Topics Concern  . Not on file  Social History Narrative  . Not on file    FAMILY HISTORY: Family History  Problem Relation Age of Onset  . Breast cancer Mother 5  . COPD Father   . Prostate cancer Brother     ALLERGIES:  is allergic to penicillins.  MEDICATIONS:  Current Outpatient Medications  Medication Sig Dispense Refill  . acetaminophen (TYLENOL) 500 MG tablet Take 1,000 mg by mouth 3 (three) times daily.    Marland Kitchen amLODipine (NORVASC) 10 MG tablet Take 1 tablet (10 mg total) by mouth daily. 30 tablet 0  . calcium carbonate 100 mg/ml SUSP Take by mouth daily.     . ciprofloxacin (CIPRO) 500 MG tablet Take 500 mg by mouth 2 (two) times daily.    . Cyanocobalamin (VITAMIN B-12 PO) Take 1,000 mcg by mouth daily.    Regino Schultze Bandages & Supports (MEDICAL COMPRESSION STOCKINGS) MISC 2 each by Does not apply route daily. 2 each 1  . estradiol (ESTRACE) 1 MG tablet Take 1 mg by mouth daily.    . Fluticasone-Salmeterol (ADVAIR DISKUS) 250-50 MCG/DOSE AEPB Inhale 1 puff into the lungs daily.     Marland Kitchen gabapentin (NEURONTIN) 100 MG capsule Take 100 mg by mouth 2 (two) times daily.     . hydroxychloroquine (PLAQUENIL) 200 MG tablet Take 200 mg by mouth 2 (two) times daily.     Marland Kitchen lidocaine-prilocaine (EMLA) cream Apply 1 application topically as needed. (Patient not taking: Reported on 05/09/2017) 30 g 3  .  LORazepam (ATIVAN) 0.5 MG tablet Take 0.5 mg every 6 (six) hours as needed by mouth.  0  . metFORMIN (GLUCOPHAGE) 500 MG tablet Take 500 mg by mouth 2 (two) times daily with a meal.    . metoprolol tartrate (LOPRESSOR) 25 MG tablet Take 1 tablet (25 mg total) by mouth 2 (two) times daily. 60 tablet 0  . ondansetron (ZOFRAN) 8 MG tablet TAKE ONE TABLET EVERY 12 HOURS AS NEEDED 30 tablet 3  . oxyCODONE (OXY IR/ROXICODONE) 5 MG immediate release tablet Take 1 tablet (5  mg total) by mouth every 6 (six) hours as needed. 120 tablet 0  . pantoprazole (PROTONIX) 40 MG tablet Take 40 mg by mouth daily.    . polyethylene glycol powder (GLYCOLAX/MIRALAX) powder Take 0.5 Containers by mouth.     . potassium chloride SA (K-DUR,KLOR-CON) 20 MEQ tablet Take 1 tablet (20 mEq total) by mouth daily as needed (low potassium). 30 tablet 0  . prochlorperazine (COMPAZINE) 10 MG tablet Take 1 tablet (10 mg total) by mouth every 6 (six) hours as needed. 30 tablet 3  . senna (SENOKOT) 8.6 MG TABS tablet Take 2 tablets (17.2 mg total) by mouth daily. Hold for loose stools. 120 each 3  . sodium chloride 0.9 % injection Flush each biliary drains (2) with 10 mL two times daily.    . Sodium Chloride Flush (NORMAL SALINE FLUSH) 0.9 % SOLN 1 mL by Other route every 8 (eight) hours. Flush each biliary drain with 1 mL twice daily 84 Syringe 3  . venlafaxine XR (EFFEXOR-XR) 75 MG 24 hr capsule Take 225 mg by mouth daily.     Marland Kitchen zolpidem (AMBIEN) 10 MG tablet Take 1 tablet (10 mg total) by mouth at bedtime as needed. 30 tablet 3   No current facility-administered medications for this visit.    Facility-Administered Medications Ordered in Other Visits  Medication Dose Route Frequency Provider Last Rate Last Dose  . 0.9 %  sodium chloride infusion   Intravenous PRN Earlie Server, MD   Stopped at 04/27/17 1400  . heparin lock flush 100 unit/mL  500 Units Intravenous Once Earlie Server, MD          .  PHYSICAL EXAMINATION: ECOG PERFORMANCE  STATUS: 2 - Symptomatic, <50% confined to bed Vitals:   05/16/17 1407  BP: 109/67  Pulse: 99  Temp: 97.6 F (36.4 C)   Filed Weights   05/16/17 1407  Weight: 147 lb (66.7 kg)   Physical Exam  Constitutional: She is oriented to person, place, and time and well-developed, well-nourished, and in no distress. No distress.  HENT:  Head: Normocephalic and atraumatic.  Mouth/Throat: Oropharynx is clear and moist.  Eyes: EOM are normal. Pupils are equal, round, and reactive to light. No scleral icterus.  Neck: Normal range of motion. Neck supple.  Cardiovascular: Regular rhythm and normal heart sounds.  Pulmonary/Chest: Effort normal. No respiratory distress. She has no wheezes.  Abdominal: Soft. Bowel sounds are normal. She exhibits no distension.  Musculoskeletal: Normal range of motion. She exhibits no edema.  trace edema.  Lymphadenopathy:    She has no cervical adenopathy.  Neurological: She is oriented to person, place, and time. No cranial nerve deficit.  Skin: Skin is dry. She is not diaphoretic.  Chronic cutaneous lupus Rash    Psychiatric: Affect and judgment normal.   .    LABORATORY DATA:  I have reviewed the data as listed Lab Results  Component Value Date   WBC 6.2 05/16/2017   HGB 8.8 (L) 05/16/2017   HCT 27.3 (L) 05/16/2017   MCV 92.0 05/16/2017   PLT 232 05/16/2017   Recent Labs    04/29/17 0320  05/05/17 0620 05/11/17 1435 05/16/17 1340  NA 138   < > 133* 136 136  K 3.4*   < > 3.3* 3.6 3.6  CL 101   < > 95* 101 100*  CO2 28   < > _0 GLUCOSE 87   < > 128* 98 111*  BUN 13   < >  _0 CREATININE 0.62   < > 0.72 1.01* 0.91  CALCIUM 8.3*   < > 8.2* 8.2* 8.4*  GFRNONAA >60   < > >60 53* 60*  GFRAA >60   < > >60 >60 >60  PROT 6.7   < > 6.2* 6.5 7.2  ALBUMIN 2.9*   < > 2.5* 2.5* 2.8*  AST 63*   < > 68* 60* 47*  ALT 51   < > 42 30 22  ALKPHOS 297*   < > 327* 287* 326*  BILITOT 3.1*   < > 1.8* 1.2 0.8  BILIDIR 1.9*  --   --   --   --     IBILI 1.2*  --   --   --   --    < > = values in this interval not displayed.   [place] CA 19-9   09/14/16 220 --> 09/28/16 176--> 10/12/16 167-->12/07/2016 64-->12/14/2016 60. --> 12/29/2016 87--> 01/26/2017 99-->03/02/2017 53-->03/23/2017 72   RADIOGRAPHIC STUDIES: I have personally reviewed the radiological images as listed and agreed with the findings in the report. CT 12/14/2016 [facility] Impression: 1. Slight interval decrease in size of the left hepatic lobe cholangiocarcinoma. Slight interval decrease in size of the hypoattenuating left hepatic lobe lesion which now measures 3.6 x 2.8 cm, previously 3.9 x 2.9 cm 2. Slight interval increase in size of a porta hepatis lymph node. Other nodes are slightly smaller. 3. Multiple bilateral thyroid nodules. Recommend ultrasound for further evaluation. 4. Stable mediastinal lymph nodes.  CT chest abdomen pelvis 02/24/2017 comparing to 08/25/16 Study in [name].  1. No acute findings within the chest, abdomen or pelvis. 2. There has been interval decrease in size of mass associated with left lobe of liver. No specific findings identified to suggest metastatic disease.Mass within the anterior and central aspect of the left lobe appears decreased in size from previous exam measuring 3.0 x 3.2 cm, 3. Bilateral percutaneous biliary drainage catheters are in place without significant biliary ductal dilatation. 4. Bilateral pleural calcifications and chronic interstitial changes of pulmonary fibrosis. Prominent mediastinal lymph nodes are nonspecific in the setting of interstitial lung disease.  CT abdomen pelvis w contrast 04/18/2017 comparing to 11.28/2018 study 2.8 x 2.5 cm hypoenhancing mass in segment 3 of the liver, mildly decreased. Associated atrophy of the lateral segment left hepatic lobe. Two indwelling biliary stent, unchanged. Small nodes in the porta hepatis measuring up to 11 mm short axis, stable versus mildly  decreased. No evidence of new/progressive metastatic disease.  CT abdomen pelvis w contrast 04/29/2017 comparing to 04/18/2017 exam.  Stable exam.  No new or progressive findings. 2.5 x 2.1 cm liver lesion, stable appearance of 2 bilary drains.    ASSESSMENT & PLAN:  Cancer Staging Cholangiocarcinoma [facility]) Staging form: Intrahepatic Bile Duct, AJCC 8th Edition - Clinical stage from 12/29/2016: Stage IV (cT1, cNX, pM1) - Signed by Earlie Server, MD on 12/29/2016  1. Cholangiocarcinoma ([place])   2. Encounter for antineoplastic chemotherapy   3. Nausea and vomiting, intractability of vomiting not specified, unspecified vomiting type   4. Other fatigue   #We will hold today's chemotherapy as patient is too weak. #Profound fatigue and loss of appetite: Possibly due to coming off steroids.  We will restart her on low-dose 5 mg prednisone and slowly taper it.  But #We will give Zofran 8 mg IV x1 Follow-up in 1 week with repeat labs.  Return of visit: 1 week  Talbert Cage  Tasia Catchings, MD, PhD Hematology Oncology [facility] at [facility] Pager- [phone] 05/16/2017

## 2017-05-16 ENCOUNTER — Inpatient Hospital Stay: Payer: PPO

## 2017-05-16 ENCOUNTER — Other Ambulatory Visit: Payer: Self-pay | Admitting: *Deleted

## 2017-05-16 ENCOUNTER — Other Ambulatory Visit: Payer: Self-pay

## 2017-05-16 ENCOUNTER — Ambulatory Visit: Payer: Self-pay | Admitting: *Deleted

## 2017-05-16 ENCOUNTER — Encounter: Payer: Self-pay | Admitting: Oncology

## 2017-05-16 ENCOUNTER — Inpatient Hospital Stay (HOSPITAL_BASED_OUTPATIENT_CLINIC_OR_DEPARTMENT_OTHER): Payer: PPO | Admitting: Oncology

## 2017-05-16 VITALS — BP 109/67 | HR 99 | Temp 97.6°F | Wt 147.0 lb

## 2017-05-16 DIAGNOSIS — R5383 Other fatigue: Secondary | ICD-10-CM | POA: Diagnosis not present

## 2017-05-16 DIAGNOSIS — R531 Weakness: Secondary | ICD-10-CM

## 2017-05-16 DIAGNOSIS — R112 Nausea with vomiting, unspecified: Secondary | ICD-10-CM

## 2017-05-16 DIAGNOSIS — R630 Anorexia: Secondary | ICD-10-CM

## 2017-05-16 DIAGNOSIS — C221 Intrahepatic bile duct carcinoma: Secondary | ICD-10-CM

## 2017-05-16 DIAGNOSIS — E860 Dehydration: Secondary | ICD-10-CM

## 2017-05-16 DIAGNOSIS — Z5111 Encounter for antineoplastic chemotherapy: Secondary | ICD-10-CM

## 2017-05-16 LAB — CBC WITH DIFFERENTIAL/PLATELET
BASOS PCT: 1 %
Basophils Absolute: 0.1 10*3/uL (ref 0–0.1)
EOS ABS: 0.2 10*3/uL (ref 0–0.7)
EOS PCT: 3 %
HCT: 27.3 % — ABNORMAL LOW (ref 35.0–47.0)
HEMOGLOBIN: 8.8 g/dL — AB (ref 12.0–16.0)
LYMPHS ABS: 0.8 10*3/uL — AB (ref 1.0–3.6)
Lymphocytes Relative: 12 %
MCH: 29.7 pg (ref 26.0–34.0)
MCHC: 32.3 g/dL (ref 32.0–36.0)
MCV: 92 fL (ref 80.0–100.0)
MONO ABS: 0.9 10*3/uL (ref 0.2–0.9)
MONOS PCT: 15 %
Neutro Abs: 4.3 10*3/uL (ref 1.4–6.5)
Neutrophils Relative %: 69 %
PLATELETS: 232 10*3/uL (ref 150–440)
RBC: 2.97 MIL/uL — ABNORMAL LOW (ref 3.80–5.20)
RDW: 16.4 % — AB (ref 11.5–14.5)
WBC: 6.2 10*3/uL (ref 3.6–11.0)

## 2017-05-16 LAB — COMPREHENSIVE METABOLIC PANEL
ALK PHOS: 326 U/L — AB (ref 38–126)
ALT: 22 U/L (ref 14–54)
AST: 47 U/L — AB (ref 15–41)
Albumin: 2.8 g/dL — ABNORMAL LOW (ref 3.5–5.0)
Anion gap: 12 (ref 5–15)
BUN: 10 mg/dL (ref 6–20)
CALCIUM: 8.4 mg/dL — AB (ref 8.9–10.3)
CHLORIDE: 100 mmol/L — AB (ref 101–111)
CO2: 24 mmol/L (ref 22–32)
Creatinine, Ser: 0.91 mg/dL (ref 0.44–1.00)
GFR calc Af Amer: >60 mL/min (ref >60)
GFR calc non Af Amer: 60 mL/min — ABNORMAL LOW (ref >60)
Glucose, Bld: 111 mg/dL — ABNORMAL HIGH (ref 65–99)
Potassium: 3.6 mmol/L (ref 3.5–5.1)
SODIUM: 136 mmol/L (ref 135–145)
Total Bilirubin: 0.8 mg/dL (ref 0.3–1.2)
Total Protein: 7.2 g/dL (ref 6.5–8.1)

## 2017-05-16 MED ORDER — PREDNISONE 1 MG PO TABS: 5.0000 mg | ORAL_TABLET | Freq: Every day | ORAL | 0 refills | Status: DC

## 2017-05-16 MED ORDER — SODIUM CHLORIDE 0.9% FLUSH
10.0000 mL | INTRAVENOUS | Status: DC | PRN
  Administered 2017-05-16: 10 mL via INTRAVENOUS
  Filled 2017-05-16: qty 10

## 2017-05-16 MED ORDER — HEPARIN SOD (PORK) LOCK FLUSH 100 UNIT/ML IV SOLN
500.0000 [IU] | Freq: Once | INTRAVENOUS | Status: AC
  Administered 2017-05-16: 500 [IU] via INTRAVENOUS
  Filled 2017-05-16: qty 5

## 2017-05-16 MED ORDER — SODIUM CHLORIDE 0.9 % IV SOLN: Freq: Once | INTRAVENOUS | Status: DC

## 2017-05-16 MED ORDER — SODIUM CHLORIDE 0.9 % IV SOLN
Freq: Once | INTRAVENOUS | Status: AC
  Administered 2017-05-16: 15:00:00 via INTRAVENOUS
  Filled 2017-05-16: qty 1000

## 2017-05-16 MED ORDER — ONDANSETRON HCL 4 MG/2ML IJ SOLN
8.0000 mg | Freq: Once | INTRAMUSCULAR | Status: AC
  Administered 2017-05-16: 8 mg via INTRAVENOUS
  Filled 2017-05-16: qty 4

## 2017-05-16 NOTE — Patient Outreach (Signed)
[facility]) Care Management  05/16/2017  BERDELL NEVITT May 27, 1940 [id number]  Transition of Care Referral  Referral Date: 05/13/17 Referral Source: HTA Urgent Outreach [facility] Date of Discharge:05/08/17 Facility: [place] Discharge Diagnosis: Cholangiocarcinoma of biliary tract Insurance:  Outreach attempt # 2 to patient. RN CM spoke with patient's son. HIPAA verified. Patient was dropped off at the hospital by her son for lab work and a MD appointment. Patient's son Elta [place]) stated, he is waiting for the member to call him when she is ready to be picked up. She stated, he didn't think her appointment would be this long today. RN CM left HIPAA contact information for member.  Plan: RN CM will contact patient within one week.    Lake Bells, RN, BSN, MHA/MSL, [place] Telephonic Care Manager Coordinator [facility] Direct Phone: [phone] Toll Free: [phone] Fax: [phone]

## 2017-05-16 NOTE — Progress Notes (Signed)
Patient here today for follow up.   

## 2017-05-17 ENCOUNTER — Ambulatory Visit: Payer: Self-pay | Admitting: *Deleted

## 2017-05-17 LAB — CANCER ANTIGEN 19-9: CA 19-9: 84 U/mL — ABNORMAL HIGH (ref 0–35)

## 2017-05-18 ENCOUNTER — Encounter: Payer: Self-pay | Admitting: *Deleted

## 2017-05-18 NOTE — Telephone Encounter (Signed)
This encounter was created in error - please disregard.

## 2017-05-20 ENCOUNTER — Encounter: Payer: Self-pay | Admitting: *Deleted

## 2017-05-20 ENCOUNTER — Other Ambulatory Visit: Payer: Self-pay | Admitting: *Deleted

## 2017-05-20 ENCOUNTER — Ambulatory Visit: Payer: Self-pay | Admitting: *Deleted

## 2017-05-20 NOTE — Patient Outreach (Signed)
[facility]) Care Management  05/20/2017  SHAQUITA FORT 04/28/1940 [id number]  Transition of Care Referral  Referral Date: 05/13/17 Referral Source: [facility]TA Urgent Outreach [facility] Date of Discharge:05/08/17 Facility: [place] Discharge Diagnosis: Cholangiocarcinoma of biliary tract Insurance: HTA  Outreach attempt # 3 to patient. No answer. RN CM left HIPAA compliant message along with contact info.   Plan: RN CM will send unsuccessful outreach letter to patient and will close case if no response from patient within 10 business days.   Lake Bells, RN, BSN, MHA/MSL, [place] Telephonic Care Manager Coordinator [facility] Direct Phone: [phone] Toll Free: [phone] Fax: [phone]

## 2017-05-22 NOTE — Progress Notes (Addendum)
Hematology/Oncology Follow up note [facility] [phone] [phone]  Patient Care Team: Idelle Crouch, MD as PCP - General (Internal Medicine) Avel Peace, Ree Kida, RN as Peyton Management REASON FOR VISIT Follow up for treatment of cholangiocarcinoma  PERTINENT ONCOLOGY HISTORY Regina Hood 77 y.o.  female with PMH listed as below, most significantly for cholangiocarcinoma who has been on chemotherapy treatment presents to transfer her cancer care to our cancer center.  After extensive medical record review, summary of her diagnosis and treatment history are listed below.  1. Initially presented in 07/2016 to an outside ED with lower and upper GI bleed.  A. She was found to have new LFTs elevations including AST 340, ALT 379, Alk phos 420 and t bili 0.9.   B. [place] was obtained, which did not visualize the left lobe.  C. She was evaluated by gastroenterology because of her history of UC and underwent colonoscopy, which was unremarkable per path report.  2. 08/20/16, CT noted diffuse atrophy of the left lobe with left intrahepatic biliary ductal dilatation, with 4.1 cm ill-defined low-attenuation mass. This mass was suspicious for malignancy.  3. 08/2016, MRI/MRCP which showed abnormal left hepatic lobe with diffuse biliary dilatation with a shrunken left hepatic lobe and a 2.42.9 cm oval-shaped masslike component displacing the adjacent bile ducts. There is an approximately 1.6 cm segment truncated bile ducts involving common hepatic duct and right and left hepatic although no dilatation of the right hepatic duct system was seen.   4. 09/02/16, ERCP showed localized biliary stricture in the left intrahepatic duct with dilation of the left intrahepatic branches. Sphincterotomy was performed and a stent was placed. EUS with a 3.3 cm mass and left duct dilation.  5. Repeat ERCP was done 09/10/16, during which the stent was  exchanged.  6. 09/20/16, taken to OR and noted to a have an peritoneal implant with biopsy c/w adenocarcinoma. The neoplastic cells are positive for CK7. They are negative for CK20, CDX-2, TTF-1, Napsin A, hep-par1, glypican 3, and arginase. The immunohistochemical features are consistent with adenocarcinoma of upper gastrointestinal tract or pancreaticobiliary origin.  7. 09/28/16, initially seen in medical oncology clinic by Dr. Filomena Jungling 8. 09/29/16, admitted for biliary obstruction and cholangitis, s/p PBD placement, d/c 10/06/16 9. 10/12/16, initiated on gemcitabine (400 mg/m2). CA 19-9 167 10. 10/26/16, increased gemcitabine (800 mg/m2) 11. 11/23/2016 Cycle 3 Gemzar (834m/m2), 12/07/2016, Gemzar (8040mm2) added cisplatin 2514m2  12. 12/14/2016, CT CAP shows slight interval decrease in size of L hepatic lobe cholangio. Slight interval increase in size of porta hepatis lymph node whiel other nodes are slightly smaller. Multiple bilateral thyroid nodules. Stable mediastinal lymph nodes. CA 19-9 60 14  8/30 Cycle 4 Gemzar (800m46m) added cisplatin 20mg47m 15, 12/28/2016 Gemcitabine 800mg/70mcisplatin was hold due to anemia. S/p 1 PRBC transfusion.   [name]   [facility] [name]    She forgot her appointment on day 8 and Gemcitabine 800mg/m64md cisplatin 20mg/m234m given  Day 9 of cycle 4 (01/05/2017).        01/19/2017  Cycle 5 Day 1Gemcitabine 800mg/m2,39mplatin 20mg/m2, 51munit blood transfusion.        01/26/2017 Cycle 5 Day 8 Gemcitabine 800mg/m2, c8matin 20mg/m2    29mShe can not tolerate Gemcitabine and Cisplatin due to cytopenia and profound weakness. She was continued on Gemcitabine  single agent. Last chemotherapy was 12/19.    16 She has been chronically on dexamethasone since she started her treatment in [place]. She was originally on 4 mg of dexamethasone when she transition her care to me. I have weaned down to 1 mg, however her skin rash got  worse. After recent hospitalization at [facility], the 19m Dexamethasone was discontinued after discharge. Due to profound weakness and the abrupt stop of Dexamethasone, I start patient on 576mprednisone on 05/16/2017.    17 She has 2 [facility]. She spiked fever one week later and had hyperbilirubinemia and was treated for possible infection. Her blood culture was negative for multiple times.     06 May 2017, Patient's PPD has been converted to externally drained and [facility] and also abdominal pain.  She called [facility] and was instructed to have labs done locally and if no rising of bilirubin level, no intervention was needed.  At my office, she has had labs done which reviewed normal bilirubin level.  She appears dehydrated and she received a hydration session at out infusion center. .HMarland Kitchenr biliary drains have about 10-20 cc/day output, draining well since she was instructed to flush it.     INTERVAL HISTORY EvSHAMONICA SCHADT89.o.  female with cholangiocarcinoma follows up for management.  She reports feeling better after starting 5 mg of prednisone.  She is still feeling nauseated for which she takes Zofran as needed with some relief.  Denies any vomiting.  Denies any diarrhea.  Her PVD output is about 10-20 cc a day.  Patient is not happy with [facility] about not telling her future plans and is interested to see if this can be taken care of locally.  Her appetite has improved slightly.  Denies any abdominal pain. Noticed rash breakout on bilateral upper extremities.  Her back is also itching.  She is taking Plaquenil for cutaneous lupus.   Review of Systems  Constitutional: Positive for fatigue. Negative for appetite change and chills.  HENT:   Negative for hearing loss and nosebleeds.   Eyes: Negative for eye problems and icterus.  Respiratory: Negative for  cough, hemoptysis and shortness of breath.        Use oxygen as needed at home.  Cardiovascular: Negative for chest pain and leg swelling.  Gastrointestinal: Negative for abdominal pain, blood in stool and diarrhea.  Endocrine: Negative for hot flashes.  Genitourinary: Negative for difficulty urinating, dysuria and nocturia.   Musculoskeletal: Negative for arthralgias, back pain and flank pain.  Skin: Positive for rash. Negative for itching.       She has noticed breaking out rash on bilateral upper extremity.  She also feels back is itching.  Neurological: Negative for dizziness and headaches.  Hematological: Negative for adenopathy.  Psychiatric/Behavioral: Negative for confusion and depression. The patient is not nervous/anxious.    MEDICAL HISTORY:  Past Medical History:  Diagnosis Date  . Arthritis   . Cancer (HCSuring  . Cellulitis   . Collagen vascular disease ([facility]  . COPD (chronic obstructive pulmonary disease) ([facility]  . DDD (degenerative disc disease), lumbar   . Depression   . Diabetes mellitus   . Diverticulitis   . GERD (gastroesophageal reflux disease)   . GI bleed   . Headache(784.0)   . Hypertension   . Lupus   .  Ulcerative colitis ([place])     SURGICAL HISTORY: Past Surgical History:  Procedure Laterality Date  . ABDOMINAL HYSTERECTOMY    . BREAST BIOPSY Right   . COLONOSCOPY    . COLONOSCOPY WITH PROPOFOL N/A 08/18/2016   Procedure: COLONOSCOPY WITH PROPOFOL;  Surgeon: Manya Silvas, MD;  Location: [facility] ENDOSCOPY;  Service: Endoscopy;  Laterality: N/A;  . ESOPHAGOGASTRODUODENOSCOPY (EGD) WITH PROPOFOL N/A 08/18/2016   Procedure: ESOPHAGOGASTRODUODENOSCOPY (EGD) WITH PROPOFOL;  Surgeon: Manya Silvas, MD;  Location: [facility] ENDOSCOPY;  Service: Endoscopy;  Laterality: N/A;  . ORIF HUMERUS FRACTURE  09/17/2011   Procedure: OPEN REDUCTION INTERNAL FIXATION (ORIF) PROXIMAL HUMERUS FRACTURE;  Surgeon: Augustin Schooling, MD;  Location: [place];  Service: Orthopedics;   Laterality: Left;    SOCIAL HISTORY: Social History   Socioeconomic History  . Marital status: Widowed    Spouse name: Not on file  . Number of children: Not on file  . Years of education: Not on file  . Highest education level: Not on file  Social Needs  . Financial resource strain: Not on file  . Food insecurity - worry: Not on file  . Food insecurity - inability: Not on file  . Transportation needs - medical: Not on file  . Transportation needs - non-medical: Not on file  Occupational History  . Not on file  Tobacco Use  . Smoking status: Former Smoker    Packs/day: 1.00    Years: 10.00    Pack years: 10.00    Types: Cigarettes    Last attempt to quit: 12/30/1998    Years since quitting: 18.4  . Smokeless tobacco: Never Used  . Tobacco comment: quit smoking in 2003  Substance and Sexual Activity  . Alcohol use: No  . Drug use: No  . Sexual activity: Not on file  Other Topics Concern  . Not on file  Social History Narrative  . Not on file    FAMILY HISTORY: Family History  Problem Relation Age of Onset  . Breast cancer Mother 27  . COPD Father   . Prostate cancer Brother     ALLERGIES:  is allergic to penicillins.  MEDICATIONS:  Current Outpatient Medications  Medication Sig Dispense Refill  . acetaminophen (TYLENOL) 500 MG tablet Take 1,000 mg by mouth 3 (three) times daily.    Marland Kitchen amLODipine (NORVASC) 10 MG tablet Take 1 tablet (10 mg total) by mouth daily. 30 tablet 0  . calcium carbonate 100 mg/ml SUSP Take by mouth daily.     . Cyanocobalamin (VITAMIN B-12 PO) Take 1,000 mcg by mouth daily.    Regino Schultze Bandages & Supports (MEDICAL COMPRESSION STOCKINGS) MISC 2 each by Does not apply route daily. 2 each 1  . estradiol (ESTRACE) 1 MG tablet Take 1 mg by mouth daily.    . Fluticasone-Salmeterol (ADVAIR DISKUS) 250-50 MCG/DOSE AEPB Inhale 1 puff into the lungs daily.     Marland Kitchen gabapentin (NEURONTIN) 100 MG capsule Take 100 mg by mouth 2 (two) times daily.       . hydroxychloroquine (PLAQUENIL) 200 MG tablet Take 200 mg by mouth 2 (two) times daily.     Marland Kitchen lidocaine-prilocaine (EMLA) cream Apply 1 application topically as needed. 30 g 3  . LORazepam (ATIVAN) 0.5 MG tablet Take 0.5 mg every 6 (six) hours as needed by mouth.  0  . metFORMIN (GLUCOPHAGE) 500 MG tablet Take 500 mg by mouth 2 (two) times daily with a meal.    . metoprolol tartrate (LOPRESSOR) 25  MG tablet Take 1 tablet (25 mg total) by mouth 2 (two) times daily. 60 tablet 0  . ondansetron (ZOFRAN) 8 MG tablet TAKE ONE TABLET EVERY 12 HOURS AS NEEDED 30 tablet 3  . oxyCODONE (OXY IR/ROXICODONE) 5 MG immediate release tablet Take 1 tablet (5 mg total) by mouth every 6 (six) hours as needed. 120 tablet 0  . pantoprazole (PROTONIX) 40 MG tablet Take 40 mg by mouth daily.    . polyethylene glycol powder (GLYCOLAX/MIRALAX) powder Take 0.5 Containers by mouth.     . potassium chloride SA (K-DUR,KLOR-CON) 20 MEQ tablet Take 1 tablet (20 mEq total) by mouth daily. 30 tablet 0  . prochlorperazine (COMPAZINE) 10 MG tablet Take 1 tablet (10 mg total) by mouth every 6 (six) hours as needed. 30 tablet 3  . senna (SENOKOT) 8.6 MG TABS tablet Take 2 tablets (17.2 mg total) by mouth daily. Hold for loose stools. 120 each 3  . sodium chloride 0.9 % injection Flush each biliary drains (2) with 10 mL two times daily.    . Sodium Chloride Flush (NORMAL SALINE FLUSH) 0.9 % SOLN 1 mL by Other route every 8 (eight) hours. Flush each biliary drain with 1 mL twice daily 84 Syringe 3  . venlafaxine XR (EFFEXOR-XR) 75 MG 24 hr capsule Take 225 mg by mouth daily.     . magnesium chloride (SLOW-MAG) 64 MG TBEC SR tablet Take 1 tablet (64 mg total) by mouth daily. 30 tablet 1  . predniSONE (DELTASONE) 20 MG tablet Take 1 tablet (20 mg total) by mouth daily with breakfast. 7 tablet 0  . zolpidem (AMBIEN) 10 MG tablet Take 1 tablet (10 mg total) by mouth at bedtime as needed. 30 tablet 3   No current facility-administered  medications for this visit.    Facility-Administered Medications Ordered in Other Visits  Medication Dose Route Frequency Provider Last Rate Last Dose  . 0.9 %  sodium chloride infusion   Intravenous PRN Earlie Server, MD   Stopped at 04/27/17 1400  . 0.9 %  sodium chloride infusion   Intravenous Once Earlie Server, MD      . heparin lock flush 100 unit/mL  500 Units Intravenous Once Earlie Server, MD      . heparin lock flush 100 unit/mL  500 Units Intravenous Once Earlie Server, MD      . sodium chloride 0.9 % 100 mL with potassium chloride 20 mEq, magnesium sulfate 2 g infusion   Intravenous Once Earlie Server, MD      . sodium chloride flush (NS) 0.9 % injection 10 mL  10 mL Intravenous PRN Earlie Server, MD          .  PHYSICAL EXAMINATION: ECOG PERFORMANCE STATUS: 2 - Symptomatic, <50% confined to bed Vitals:   05/23/17 1338  BP: 121/71  Pulse: (!) 105  Temp: (!) 97.2 F (36.2 C)   Filed Weights   05/23/17 1338  Weight: 147 lb 1 oz (66.7 kg)   Physical Exam  Constitutional: She is oriented to person, place, and time and well-developed, well-nourished, and in no distress. No distress.  HENT:  Head: Normocephalic and atraumatic.  Mouth/Throat: Oropharynx is clear and moist.  Eyes: EOM are normal. Pupils are equal, round, and reactive to light. No scleral icterus.  Neck: Normal range of motion. Neck supple. No JVD present.  Cardiovascular: Regular rhythm and normal heart sounds.  Pulmonary/Chest: Effort normal and breath sounds normal. No respiratory distress. She has no wheezes. She has no  rales.  Abdominal: Soft. Bowel sounds are normal. She exhibits no distension.  Musculoskeletal: Normal range of motion. She exhibits no edema or deformity.  trace edema.  Lymphadenopathy:    She has no cervical adenopathy.  Neurological: She is oriented to person, place, and time. No cranial nerve deficit.  Skin: Skin is dry. She is not diaphoretic. There is erythema.  Erythematous rash on both upper extremities  and back. Her chronic lupus rash mainly concentrated on her anterior chest which looks stable.   Psychiatric: Affect and judgment normal.         .    LABORATORY DATA:  I have reviewed the data as listed Lab Results  Component Value Date   WBC 6.2 05/16/2017   HGB 8.8 (L) 05/16/2017   HCT 27.3 (L) 05/16/2017   MCV 92.0 05/16/2017   PLT 232 05/16/2017   Recent Labs    04/29/17 0320  05/05/17 0620 05/11/17 1435 05/16/17 1340  NA 138   < > 133* 136 136  K 3.4*   < > 3.3* 3.6 3.6  CL 101   < > 95* 101 100*  CO2 28   < > 29 24 24   GLUCOSE 87   < > 128* 98 111*  BUN 13   < > 10 11 10   CREATININE 0.62   < > 0.72 1.01* 0.91  CALCIUM 8.3*   < > 8.2* 8.2* 8.4*  GFRNONAA >60   < > >60 53* 60*  GFRAA >60   < > >60 >60 >60  PROT 6.7   < > 6.2* 6.5 7.2  ALBUMIN 2.9*   < > 2.5* 2.5* 2.8*  AST 63*   < > 68* 60* 47*  ALT 51   < > 42 30 22  ALKPHOS 297*   < > 327* 287* 326*  BILITOT 3.1*   < > 1.8* 1.2 0.8  BILIDIR 1.9*  --   --   --   --   IBILI 1.2*  --   --   --   --    < > = values in this interval not displayed.   [place] CA 19-9   09/14/16 220 --> 09/28/16 176--> 10/12/16 167-->12/07/2016 64-->12/14/2016 60. --> 12/29/2016 87--> 01/26/2017 99-->03/02/2017 53-->03/23/2017 72   RADIOGRAPHIC STUDIES: I have personally reviewed the radiological images as listed and agreed with the findings in the report. CT 12/14/2016 [facility] Impression: 1. Slight interval decrease in size of the left hepatic lobe cholangiocarcinoma. Slight interval decrease in size of the hypoattenuating left hepatic lobe lesion which now measures 3.6 x 2.8 cm, previously 3.9 x 2.9 cm 2. Slight interval increase in size of a porta hepatis lymph node. Other nodes are slightly smaller. 3. Multiple bilateral thyroid nodules. Recommend ultrasound for further evaluation. 4. Stable mediastinal lymph nodes.  CT chest abdomen pelvis 02/24/2017 comparing to 08/25/16 Study in [facility].  1.  No acute findings within the chest, abdomen or pelvis. 2. There has been interval decrease in size of mass associated with left lobe of liver. No specific findings identified to suggest metastatic disease.Mass within the anterior and central aspect of the left lobe appears decreased in size from previous exam measuring 3.0 x 3.2 cm, 3. Bilateral percutaneous biliary drainage catheters are in place without significant biliary ductal dilatation. 4. Bilateral pleural calcifications and chronic interstitial changes of pulmonary fibrosis. Prominent mediastinal lymph nodes are nonspecific in the setting of interstitial lung disease.  CT abdomen pelvis w  contrast 04/18/2017 comparing to 11.28/2018 study 2.8 x 2.5 cm hypoenhancing mass in segment 3 of the liver, mildly decreased. Associated atrophy of the lateral segment left hepatic lobe. Two indwelling biliary stent, unchanged. Small nodes in the porta hepatis measuring up to 11 mm short axis, stable versus mildly decreased. No evidence of new/progressive metastatic disease.  CT abdomen pelvis w contrast 04/29/2017 comparing to 04/18/2017 exam.  Stable exam.  No new or progressive findings. 2.5 x 2.1 cm liver lesion, stable appearance of 2 bilary drains.    ASSESSMENT & PLAN:  Cancer Staging Cholangiocarcinoma [facility]) Staging form: Intrahepatic Bile Duct, AJCC 8th Edition - Clinical stage from 12/29/2016: Stage IV (cT1, cNX, pM1) - Signed by Earlie Server, MD on 12/29/2016  1. Cholangiocarcinoma ([place])   2. Hypokalemia   3. Other fatigue   4. Hypomagnesemia   5. Anemia due to antineoplastic chemotherapy   6. Systemic lupus erythematosus, unspecified SLE type, unspecified organ involvement status ([place])   #Continue holding chemotherapy given patient's performance status.  # Rash outbreak: ? Drug reaction due to recent antibiotics or flare of her lupus. Her lupus rash mostly concentrated on her anterior chest which has not change significantly.  I will  increase patient's steroid does to 20m daily to see if this will help. She was on PPI for GI prophylaxis and I will refill her Rx.  Patient will call me at this week and let me know if rash is getting worse or better.   # PBD drainage, currently externally draining. I have discussed with radiology Dr.Yamagata who recommends patient to set up appointment with radiology 8 weeks after her last tube change.   # Hypokalemia and hypomagnesia: 234m Kcl IV x1 and Magnesium sulfate 2g x 1 Sent Rx of KCL 2028mdaily and Slow Mag 11m93mily.   Follow-up in 1 week with repeat labs/possible electrolyte supplements. Total face to face encounter time for this patient visit was 45 min. >50% of the time was  spent in counseling and coordination of care.  Return of visit: 1 week  ZhouEarlie Server, PhD Hematology Oncology [facility]- [id number]

## 2017-05-23 ENCOUNTER — Encounter: Payer: Self-pay | Admitting: Oncology

## 2017-05-23 ENCOUNTER — Inpatient Hospital Stay: Payer: PPO

## 2017-05-23 ENCOUNTER — Other Ambulatory Visit: Payer: Self-pay

## 2017-05-23 ENCOUNTER — Inpatient Hospital Stay (HOSPITAL_BASED_OUTPATIENT_CLINIC_OR_DEPARTMENT_OTHER): Payer: PPO | Admitting: Oncology

## 2017-05-23 ENCOUNTER — Inpatient Hospital Stay: Payer: PPO | Attending: Oncology

## 2017-05-23 VITALS — BP 121/71 | HR 105 | Temp 97.2°F | Wt 147.1 lb

## 2017-05-23 DIAGNOSIS — D6481 Anemia due to antineoplastic chemotherapy: Secondary | ICD-10-CM | POA: Insufficient documentation

## 2017-05-23 DIAGNOSIS — E876 Hypokalemia: Secondary | ICD-10-CM | POA: Diagnosis not present

## 2017-05-23 DIAGNOSIS — C221 Intrahepatic bile duct carcinoma: Secondary | ICD-10-CM | POA: Diagnosis not present

## 2017-05-23 DIAGNOSIS — M329 Systemic lupus erythematosus, unspecified: Secondary | ICD-10-CM | POA: Insufficient documentation

## 2017-05-23 DIAGNOSIS — R21 Rash and other nonspecific skin eruption: Secondary | ICD-10-CM | POA: Diagnosis not present

## 2017-05-23 DIAGNOSIS — T451X5A Adverse effect of antineoplastic and immunosuppressive drugs, initial encounter: Secondary | ICD-10-CM

## 2017-05-23 DIAGNOSIS — R1011 Right upper quadrant pain: Secondary | ICD-10-CM | POA: Diagnosis not present

## 2017-05-23 DIAGNOSIS — E860 Dehydration: Secondary | ICD-10-CM

## 2017-05-23 DIAGNOSIS — R5383 Other fatigue: Secondary | ICD-10-CM

## 2017-05-23 LAB — CBC WITH DIFFERENTIAL/PLATELET
BASOS ABS: 0 10*3/uL (ref 0–0.1)
Basophils Relative: 1 %
EOS PCT: 2 %
Eosinophils Absolute: 0.1 10*3/uL (ref 0–0.7)
HCT: 27.4 % — ABNORMAL LOW (ref 35.0–47.0)
HEMOGLOBIN: 8.8 g/dL — AB (ref 12.0–16.0)
LYMPHS ABS: 0.6 10*3/uL — AB (ref 1.0–3.6)
LYMPHS PCT: 11 %
MCH: 29.2 pg (ref 26.0–34.0)
MCHC: 32.1 g/dL (ref 32.0–36.0)
MCV: 90.9 fL (ref 80.0–100.0)
Monocytes Absolute: 0.7 10*3/uL (ref 0.2–0.9)
Monocytes Relative: 13 %
Neutro Abs: 4.1 10*3/uL (ref 1.4–6.5)
Neutrophils Relative %: 73 %
PLATELETS: 187 10*3/uL (ref 150–440)
RBC: 3.01 MIL/uL — ABNORMAL LOW (ref 3.80–5.20)
RDW: 15.5 % — ABNORMAL HIGH (ref 11.5–14.5)
WBC: 5.5 10*3/uL (ref 3.6–11.0)

## 2017-05-23 LAB — COMPREHENSIVE METABOLIC PANEL
ALT: 30 U/L (ref 14–54)
ANION GAP: 11 (ref 5–15)
AST: 56 U/L — AB (ref 15–41)
Albumin: 2.9 g/dL — ABNORMAL LOW (ref 3.5–5.0)
Alkaline Phosphatase: 317 U/L — ABNORMAL HIGH (ref 38–126)
BILIRUBIN TOTAL: 0.6 mg/dL (ref 0.3–1.2)
BUN: 12 mg/dL (ref 6–20)
CHLORIDE: 98 mmol/L — AB (ref 101–111)
CO2: 27 mmol/L (ref 22–32)
Calcium: 8.5 mg/dL — ABNORMAL LOW (ref 8.9–10.3)
Creatinine, Ser: 0.89 mg/dL (ref 0.44–1.00)
Glucose, Bld: 161 mg/dL — ABNORMAL HIGH (ref 65–99)
POTASSIUM: 3.2 mmol/L — AB (ref 3.5–5.1)
Sodium: 136 mmol/L (ref 135–145)
TOTAL PROTEIN: 7.1 g/dL (ref 6.5–8.1)

## 2017-05-23 LAB — MAGNESIUM: MAGNESIUM: 1.4 mg/dL — AB (ref 1.7–2.4)

## 2017-05-23 MED ORDER — PREDNISONE 20 MG PO TABS: 20.0000 mg | ORAL_TABLET | Freq: Every day | ORAL | 0 refills | Status: DC

## 2017-05-23 MED ORDER — SODIUM CHLORIDE 0.9 % IV SOLN: 2.0000 g | Freq: Once | INTRAVENOUS | Status: DC

## 2017-05-23 MED ORDER — POTASSIUM CHLORIDE 20 MEQ/100ML IV SOLN: 20.0000 meq | Freq: Once | INTRAVENOUS | Status: DC

## 2017-05-23 MED ORDER — SODIUM CHLORIDE 0.9 % IV SOLN
Freq: Once | INTRAVENOUS | Status: AC
  Administered 2017-05-23: 14:00:00 via INTRAVENOUS
  Filled 2017-05-23: qty 1000

## 2017-05-23 MED ORDER — HEPARIN SOD (PORK) LOCK FLUSH 100 UNIT/ML IV SOLN
500.0000 [IU] | Freq: Once | INTRAVENOUS | Status: AC
  Administered 2017-05-23: 500 [IU] via INTRAVENOUS
  Filled 2017-05-23: qty 5

## 2017-05-23 MED ORDER — ONDANSETRON HCL 4 MG/2ML IJ SOLN
8.0000 mg | Freq: Once | INTRAMUSCULAR | Status: AC
  Administered 2017-05-23: 8 mg via INTRAVENOUS
  Filled 2017-05-23: qty 4

## 2017-05-23 MED ORDER — PANTOPRAZOLE SODIUM 40 MG PO TBEC: 40.0000 mg | DELAYED_RELEASE_TABLET | Freq: Every day | ORAL | 3 refills | Status: DC

## 2017-05-23 MED ORDER — MAGNESIUM CHLORIDE 64 MG PO TBEC: 1.0000 | DELAYED_RELEASE_TABLET | Freq: Every day | ORAL | 1 refills | Status: DC

## 2017-05-23 MED ORDER — SODIUM CHLORIDE 0.9% FLUSH
10.0000 mL | INTRAVENOUS | Status: DC | PRN
  Administered 2017-05-23: 10 mL via INTRAVENOUS
  Filled 2017-05-23: qty 10

## 2017-05-23 MED ORDER — SODIUM CHLORIDE 0.9 % IV SOLN: Freq: Once | INTRAVENOUS | Status: DC

## 2017-05-23 MED ORDER — SODIUM CHLORIDE 0.9 % IV SOLN
Freq: Once | INTRAVENOUS | Status: AC
  Administered 2017-05-23: 15:00:00 via INTRAVENOUS
  Filled 2017-05-23: qty 100

## 2017-05-23 MED ORDER — POTASSIUM CHLORIDE CRYS ER 20 MEQ PO TBCR: 20.0000 meq | EXTENDED_RELEASE_TABLET | Freq: Every day | ORAL | 0 refills | Status: DC

## 2017-05-23 NOTE — Progress Notes (Signed)
Patient here today for follow up.  Patient states no new concerns today  

## 2017-05-23 NOTE — Addendum Note (Signed)
Addended by: Earlie Server on: 05/23/2017 04:22 PM   Modules accepted: Level of Service

## 2017-05-23 NOTE — Patient Outreach (Signed)
[facility]) Care Management 05/23/2017   JAYDON SOROKA 08/20/1940 [id number]  Subjective:  Mrs. Bracewell is a pleasant 77 y/o female who was recently discharged from [facility] for sepsis, discharged on Augmentin. PMH significant for cholangiocarcinoma s/p 2 biliary drains. Per patient, she recently got back from [facility] for a follow-up of infected drains. The drains were removed and a bag was applied to collect biliary fluid. She was also switched to Cipro and told to stop taking dexamethasone.   Objective:   Current Medications:        Current Outpatient Medications  Medication Sig Dispense Refill  . acetaminophen (TYLENOL) 500 MG tablet Take 1,000 mg by mouth 3 (three) times daily.    Marland Kitchen amLODipine (NORVASC) 10 MG tablet Take 1 tablet (10 mg total) by mouth daily. 30 tablet 0  . ciprofloxacin (CIPRO) 500 MG tablet Take 500 mg by mouth 2 (two) times daily.    . Cyanocobalamin (VITAMIN B-12 PO) Take 1,000 mcg by mouth daily.    Regino Schultze Bandages & Supports (MEDICAL COMPRESSION STOCKINGS) MISC 2 each by Does not apply route daily. 2 each 1  . estradiol (ESTRACE) 1 MG tablet Take 1 mg by mouth daily.    . Fluticasone-Salmeterol (ADVAIR DISKUS) 250-50 MCG/DOSE AEPB Inhale 1 puff into the lungs daily.     Marland Kitchen gabapentin (NEURONTIN) 100 MG capsule Take 100 mg by mouth 2 (two) times daily.     . hydroxychloroquine (PLAQUENIL) 200 MG tablet Take 200 mg by mouth 2 (two) times daily.     . metFORMIN (GLUCOPHAGE) 500 MG tablet Take 500 mg by mouth 2 (two) times daily with a meal.    . metoprolol tartrate (LOPRESSOR) 25 MG tablet Take 1 tablet (25 mg total) by mouth 2 (two) times daily. 60 tablet 0  . ondansetron (ZOFRAN) 8 MG tablet TAKE ONE TABLET EVERY 12 HOURS AS NEEDED 30 tablet 3  . oxyCODONE (OXY IR/ROXICODONE) 5 MG immediate release tablet Take 1 tablet (5 mg total) by mouth every 6 (six) hours as needed. 120 tablet 0  . pantoprazole (PROTONIX) 40 MG  tablet Take 40 mg by mouth daily.    . potassium chloride SA (K-DUR,KLOR-CON) 20 MEQ tablet Take 1 tablet (20 mEq total) by mouth daily as needed (low potassium). 30 tablet 0  . prochlorperazine (COMPAZINE) 10 MG tablet Take 1 tablet (10 mg total) by mouth every 6 (six) hours as needed. 30 tablet 3  . senna (SENOKOT) 8.6 MG TABS tablet Take 2 tablets (17.2 mg total) by mouth daily. Hold for loose stools. 120 each 3  . sodium chloride 0.9 % injection Flush each biliary drains (2) with 10 mL two times daily.    . Sodium Chloride Flush (NORMAL SALINE FLUSH) 0.9 % SOLN 1 mL by Other route every 8 (eight) hours. Flush each biliary drain with 1 mL twice daily 84 Syringe 3  . venlafaxine XR (EFFEXOR-XR) 75 MG 24 hr capsule Take 225 mg by mouth daily.     . calcium carbonate 100 mg/ml SUSP Take by mouth daily.     Marland Kitchen lidocaine-prilocaine (EMLA) cream Apply 1 application topically as needed. (Patient not taking: Reported on 05/09/2017) 30 g 3  . LORazepam (ATIVAN) 0.5 MG tablet Take 0.5 mg every 6 (six) hours as needed by mouth.  0  . polyethylene glycol powder (GLYCOLAX/MIRALAX) powder Take 0.5 Containers by mouth.     . zolpidem (AMBIEN) 10 MG tablet Take 1 tablet (10 mg total) by  mouth at bedtime as needed. 30 tablet 3   No current facility-administered medications for this visit.             Facility-Administered Medications Ordered in Other Visits  Medication Dose Route Frequency Provider Last Rate Last Dose  . 0.9 %  sodium chloride infusion   Intravenous PRN Earlie Server, MD   Stopped at 04/27/17 1400  . heparin lock flush 100 unit/mL  500 Units Intravenous Once Earlie Server, MD        Functional Status:  In your present state of health, do you have any difficulty performing the following activities: 05/04/2017 04/27/2017  Hearing? N N  Vision? N N  Difficulty concentrating or making decisions? N N  Walking or climbing stairs? N N  Comment - -  Dressing or bathing? N N  Doing errands,  shopping? N N  Some recent data might be hidden    Fall/Depression Screening: Fall Risk  07/09/2016  Falls in the past year? No   PHQ 2/9 Scores 07/09/2016  PHQ - 2 Score 0    ASSESSMENT: Date Discharged from Hospital: 05/05/2017 Date Medication Reconciliation Performed: 05/23/2017  New Medications at Discharge:    Augmentin 875/125 mg BID x 7 days  Patient was recently discharged from hospital and all medications have been reviewed.   Drugs sorted by system:  Neurologic/Psychologic: - gabapentin 100 mg BID - venlafaxine XR 75 mg daily - zolpidem 10 mg HS prn - lorazepam 0.5 mg q6h prn  Cardiovascular: - amlodipine 10 mg daily - metoprolol tartrate 25 mg BID  Pulmonary/Allergy: - Advair 250/50 once daily  Gastrointestinal: - docusate/senna - pantoprazole 40 mg qAM - ondansetron 8 mg q12h prn - prochlorperazine 10 mg q6h prn  Endocrine: - metformin 500 mg BID  - estradiol 1 mg daily  Renal: n/a  Topical: n/a  Pain: - acetaminophen 1000 mg TID prn - oxycodone 5 mg q6 hrs PRN (uses very rarely)  Vitamins/Minerals: - cyanocobalamin 1000 mcg daily - potassium chloride 20 mEq daily  Infectious Diseases: - cipro 500 mg BID  Miscellaneous: - hydroxychloroquine 408 mg BID  Duplications in therapy: none Gaps in therapy: no rescue inhaler or LAMA with COPD dx Medications to avoid in the elderly: lorazepam, zolpidem Drug interactions: no significant interactions Other issues noted: Advair was being used PRN. Dexamethasone and Augmentin discontinued by [place] [facility] per patient, started Cipro   PLAN: - Instructed patient to take Cipro as prescribed by [place] [facility], stop taking Augmentin and continue all other medications as prescribed - Zolpidem dose is high for female patient - max dose is 5 mg - Educated patient to use Advair every day as a long-term controller rather than using PRN for shortness of breath - Patient has  history of COPD, a LAMA (ie, Spiriva) may be better choice of controller medication. She also does not currently have a prescription for a rescue inhaler (albuterol)   Diana L. Kyung Rudd, PharmD, [place] PGY1 Pharmacy Resident Pager: [phone]

## 2017-05-25 ENCOUNTER — Telehealth: Payer: Self-pay | Admitting: Internal Medicine

## 2017-05-25 NOTE — Telephone Encounter (Signed)
Lmov for patient need to reschedule appointment with Dr Mortimer Fries that is for 05/26/17 at 1:45 He has an emergency procedure to do. Will need to come in that morning.

## 2017-05-26 ENCOUNTER — Ambulatory Visit: Payer: Self-pay | Admitting: Internal Medicine

## 2017-05-27 ENCOUNTER — Telehealth: Payer: Self-pay | Admitting: *Deleted

## 2017-05-27 DIAGNOSIS — J449 Chronic obstructive pulmonary disease, unspecified: Secondary | ICD-10-CM | POA: Diagnosis not present

## 2017-05-27 NOTE — Telephone Encounter (Signed)
Patient called with update on her rash as instructed. She reports she continues to take Prednisone, but has really not seen any change in the rash which is very red. Please advise if new orders.

## 2017-05-27 NOTE — Telephone Encounter (Signed)
Patient informed per VO Regina Hood to continue Prednisone, no new orders

## 2017-05-29 NOTE — Progress Notes (Addendum)
Hematology/Oncology Follow up note [facility] [phone] [phone]  Patient Care Team: Idelle Crouch, MD as PCP - General (Internal Medicine) Avel Peace, Ree Kida, RN as [place] Management REASON FOR VISIT Follow up for treatment of cholangiocarcinoma  PERTINENT ONCOLOGY HISTORY Regina Hood 77 y.o.  female with PMH listed as below, most significantly for cholangiocarcinoma who has been on chemotherapy treatment presents to transfer her cancer care to our cancer center.  After extensive medical record review, summary of her diagnosis and treatment history are listed below.  1. Initially presented in 07/2016 to an outside ED with lower and upper GI bleed.  A. She was found to have new LFTs elevations including AST 340, ALT 379, Alk phos 420 and t bili 0.9.   B. [place] was obtained, which did not visualize the left lobe.  C. She was evaluated by gastroenterology because of her history of UC and underwent colonoscopy, which was unremarkable per path report.  2. 08/20/16, CT noted diffuse atrophy of the left lobe with left intrahepatic biliary ductal dilatation, with 4.1 cm ill-defined low-attenuation mass. This mass was suspicious for malignancy.  3. 08/2016, MRI/MRCP which showed abnormal left hepatic lobe with diffuse biliary dilatation with a shrunken left hepatic lobe and a 2.42.9 cm oval-shaped masslike component displacing the adjacent bile ducts. There is an approximately 1.6 cm segment truncated bile ducts involving common hepatic duct and right and left hepatic although no dilatation of the right hepatic duct system was seen.   4. 09/02/16, ERCP showed localized biliary stricture in the left intrahepatic duct with dilation of the left intrahepatic branches. Sphincterotomy was performed and a stent was placed. EUS with a 3.3 cm mass and left duct dilation.  5. Repeat ERCP was done 09/10/16, during which the stent was  exchanged.  6. 09/20/16, taken to OR and noted to a have an peritoneal implant with biopsy c/w adenocarcinoma. The neoplastic cells are positive for CK7. They are negative for CK20, CDX-2, TTF-1, Napsin A, hep-par1, glypican 3, and arginase. The immunohistochemical features are consistent with adenocarcinoma of upper gastrointestinal tract or pancreaticobiliary origin.  7. 09/28/16, initially seen in medical oncology clinic by Dr. Filomena Jungling 8. 09/29/16, admitted for biliary obstruction and cholangitis, s/p PBD placement, d/c 10/06/16 9. 10/12/16, initiated on gemcitabine (400 mg/m2). CA 19-9 167 10. 10/26/16, increased gemcitabine (800 mg/m2) 11. 11/23/2016 Cycle 3 Gemzar (834m/m2), 12/07/2016, Gemzar (8040mm2) added cisplatin 2514m2  12. 12/14/2016, CT CAP shows slight interval decrease in size of L hepatic lobe cholangio. Slight interval increase in size of porta hepatis lymph node whiel other nodes are slightly smaller. Multiple bilateral thyroid nodules. Stable mediastinal lymph nodes. CA 19-9 60 14  8/30 Cycle 4 Gemzar (800m46m) added cisplatin 20mg47m 15, 12/28/2016 Gemcitabine 800mg/70mcisplatin was hold due to anemia. S/p 1 PRBC transfusion.   [name]   [facility] [name]    She forgot her appointment on day 8 and Gemcitabine 800mg/m64md cisplatin 20mg/m234m given  Day 9 of cycle 4 (01/05/2017).        01/19/2017  Cycle 5 Day 1Gemcitabine 800mg/m2,39mplatin 20mg/m2, 51munit blood transfusion.        01/26/2017 Cycle 5 Day 8 Gemcitabine 800mg/m2, c8matin 20mg/m2    29mShe can not tolerate Gemcitabine and Cisplatin due to cytopenia and profound weakness. She was continued on Gemcitabine  single agent. Last chemotherapy was 12/19.    16 She has been chronically on dexamethasone since she started her treatment in [place]. She was originally on 4 mg of dexamethasone when she transition her care to me. I have weaned down to 1 mg, however her skin rash got  worse. After recent hospitalization at [facility], the 59m Dexamethasone was discontinued after discharge. Due to profound weakness and the abrupt stop of Dexamethasone, I start patient on 534mprednisone on 05/16/2017.    17 She has [facility] She spiked fever one week later and had hyperbilirubinemia and was treated for possible infection. Her blood culture was negative for multiple times.     06 May 2017, Patient's PPD has been converted to externally drained and [facility] and also abdominal pain.  She called [facility] and was instructed to have labs done locally and if no rising of bilirubin level, no intervention was needed.  At my office, she has had labs done which reviewed normal bilirubin level.  She appears dehydrated and she received a hydration session at out infusion center. .HMarland Kitchenr biliary drains have about 10-20 cc/day output, draining well since she was instructed to flush it.     INTERVAL HISTORY Regina PIETILA89.o.  female with cholangiocarcinoma follows up for management.  She reports a lot less fatigued after taking 20 mg of prednisone. Rash on her back and bilateral upper extremities remain the same, slightly better.  Her right PPD drain has about 10-20 cc output a day.  Denies any vomiting, nausea, diarrhea.  She reports one episode of anterior chest pain, without any radiation to left shoulder or jaw, heavy in nature, which spontaneously resolved.  Today she denies any chest pain, shortness of breath, abdominal pain, lower extremity swelling.  She is taking Plaquenil for cutaneous lupus.  She takes PPI for GI prophylaxis.  Her appetite is good.   Review of Systems  Constitutional: Positive for fatigue. Negative for appetite change, chills and diaphoresis.  HENT:   Negative for hearing loss, nosebleeds and sore throat.   Eyes: Negative  for eye problems and icterus.  Respiratory: Negative for cough, hemoptysis and shortness of breath.        Use oxygen as needed at home.  Cardiovascular: Negative for chest pain and leg swelling.       One episode of chest pain with spontaneous resolution.  Gastrointestinal: Negative for abdominal pain, blood in stool, constipation and diarrhea.  Endocrine: Negative for hot flashes.  Genitourinary: Negative for difficulty urinating, dyspareunia, dysuria and nocturia.   Musculoskeletal: Negative for arthralgias, back pain, flank pain and myalgias.  Skin: Positive for rash. Negative for itching.       She has noticed breaking out rash on bilateral upper extremity.  She also feels back is itching.  Neurological: Negative for dizziness, headaches, numbness and seizures.  Hematological: Negative for adenopathy.  Psychiatric/Behavioral: Negative for confusion and depression. The patient is not nervous/anxious.    MEDICAL HISTORY:  Past Medical History:  Diagnosis Date  . Arthritis   . Cancer ([place]  . Cellulitis   . Collagen vascular disease ([place]  . COPD (chronic obstructive pulmonary disease) ([place]  . DDD (degenerative disc disease), lumbar   . Depression   . Diabetes mellitus   . Diverticulitis   . GERD (gastroesophageal  reflux disease)   . GI bleed   . Headache(784.0)   . Hypertension   . Lupus   . Ulcerative colitis ([place])     SURGICAL HISTORY: Past Surgical History:  Procedure Laterality Date  . ABDOMINAL HYSTERECTOMY    . BREAST BIOPSY Right   . COLONOSCOPY    . COLONOSCOPY WITH PROPOFOL N/A 08/18/2016   Procedure: COLONOSCOPY WITH PROPOFOL;  Surgeon: Manya Silvas, MD;  Location: [facility] ENDOSCOPY;  Service: Endoscopy;  Laterality: N/A;  . ESOPHAGOGASTRODUODENOSCOPY (EGD) WITH PROPOFOL N/A 08/18/2016   Procedure: ESOPHAGOGASTRODUODENOSCOPY (EGD) WITH PROPOFOL;  Surgeon: Manya Silvas, MD;  Location[facility] ENDOSCOPY;  Service: Endoscopy;  Laterality: N/A;  . ORIF HUMERUS  FRACTURE  09/17/2011   Procedure: OPEN REDUCTION INTERNAL FIXATION (ORIF) PROXIMAL HUMERUS FRACTURE;  Surgeon: Augustin Schooling, MD;  Location: [place];  Service: Orthopedics;  Laterality: Left;    SOCIAL HISTORY: Social History   Socioeconomic History  . Marital status: Widowed    Spouse name: Not on file  . Number of children: Not on file  . Years of education: Not on file  . Highest education level: Not on file  Social Needs  . Financial resource strain: Not on file  . Food insecurity - worry: Not on file  . Food insecurity - inability: Not on file  . Transportation needs - medical: Not on file  . Transportation needs - non-medical: Not on file  Occupational History  . Not on file  Tobacco Use  . Smoking status: Former Smoker    Packs/day: 1.00    Years: 10.00    Pack years: 10.00    Types: Cigarettes    Last attempt to quit: 12/30/1998    Years since quitting: 18.4  . Smokeless tobacco: Never Used  . Tobacco comment: quit smoking in 2003  Substance and Sexual Activity  . Alcohol use: No  . Drug use: No  . Sexual activity: Not on file  Other Topics Concern  . Not on file  Social History Narrative  . Not on file    FAMILY HISTORY: Family History  Problem Relation Age of Onset  . Breast cancer Mother 45  . COPD Father   . Prostate cancer Brother     ALLERGIES:  is allergic to penicillins.  MEDICATIONS:  Current Outpatient Medications  Medication Sig Dispense Refill  . acetaminophen (TYLENOL) 500 MG tablet Take 1,000 mg by mouth 3 (three) times daily.    Marland Kitchen amLODipine (NORVASC) 10 MG tablet Take 1 tablet (10 mg total) by mouth daily. 30 tablet 0  . calcium carbonate 100 mg/ml SUSP Take by mouth daily.     . Cyanocobalamin (VITAMIN B-12 PO) Take 1,000 mcg by mouth daily.    Regino Schultze Bandages & Supports (MEDICAL COMPRESSION STOCKINGS) MISC 2 each by Does not apply route daily. 2 each 1  . estradiol (ESTRACE) 1 MG tablet Take 1 mg by mouth daily.    .  Fluticasone-Salmeterol (ADVAIR DISKUS) 250-50 MCG/DOSE AEPB Inhale 1 puff into the lungs daily.     Marland Kitchen gabapentin (NEURONTIN) 100 MG capsule Take 100 mg by mouth 2 (two) times daily.     . hydroxychloroquine (PLAQUENIL) 200 MG tablet Take 200 mg by mouth 2 (two) times daily.     Marland Kitchen lidocaine-prilocaine (EMLA) cream Apply 1 application topically as needed. 30 g 3  . LORazepam (ATIVAN) 0.5 MG tablet Take 0.5 mg every 6 (six) hours as needed by mouth.  0  . magnesium chloride (SLOW-MAG) 64  MG TBEC SR tablet Take 1 tablet (64 mg total) by mouth daily. 30 tablet 1  . metFORMIN (GLUCOPHAGE) 500 MG tablet Take 500 mg by mouth 2 (two) times daily with a meal.    . metoprolol tartrate (LOPRESSOR) 25 MG tablet Take 1 tablet (25 mg total) by mouth 2 (two) times daily. 60 tablet 0  . ondansetron (ZOFRAN) 8 MG tablet TAKE ONE TABLET EVERY 12 HOURS AS NEEDED 30 tablet 3  . oxyCODONE (OXY IR/ROXICODONE) 5 MG immediate release tablet Take 1 tablet (5 mg total) by mouth every 6 (six) hours as needed. 120 tablet 0  . pantoprazole (PROTONIX) 40 MG tablet Take 1 tablet (40 mg total) by mouth daily. 30 tablet 3  . polyethylene glycol powder (GLYCOLAX/MIRALAX) powder Take 0.5 Containers by mouth.     . potassium chloride SA (K-DUR,KLOR-CON) 20 MEQ tablet Take 1 tablet (20 mEq total) by mouth daily. 30 tablet 0  . predniSONE (DELTASONE) 20 MG tablet Take 1 tablet (20 mg total) by mouth daily with breakfast. 7 tablet 0  . prochlorperazine (COMPAZINE) 10 MG tablet Take 1 tablet (10 mg total) by mouth every 6 (six) hours as needed. 30 tablet 3  . senna (SENOKOT) 8.6 MG TABS tablet Take 2 tablets (17.2 mg total) by mouth daily. Hold for loose stools. 120 each 3  . sodium chloride 0.9 % injection Flush each biliary drains (2) with 10 mL two times daily.    . Sodium Chloride Flush (NORMAL SALINE FLUSH) 0.9 % SOLN 1 mL by Other route every 8 (eight) hours. Flush each biliary drain with 1 mL twice daily 84 Syringe 3  . venlafaxine  XR (EFFEXOR-XR) 75 MG 24 hr capsule Take 225 mg by mouth daily.     Marland Kitchen zolpidem (AMBIEN) 10 MG tablet Take 1 tablet (10 mg total) by mouth at bedtime as needed. 30 tablet 3   No current facility-administered medications for this visit.    Facility-Administered Medications Ordered in Other Visits  Medication Dose Route Frequency Provider Last Rate Last Dose  . 0.9 %  sodium chloride infusion   Intravenous PRN Earlie Server, MD   Stopped at 04/27/17 1400  . 0.9 %  sodium chloride infusion   Intravenous PRN Earlie Server, MD      . heparin lock flush 100 unit/mL  500 Units Intravenous Once Earlie Server, MD      . magnesium sulfate 2 g in sodium chloride 0.9 % 500 mL  2 g Intravenous Once Earlie Server, MD          .  PHYSICAL EXAMINATION: ECOG PERFORMANCE STATUS: 2 - Symptomatic, <50% confined to bed Vitals:   05/30/17 1005  BP: 132/73  Pulse: 96  Resp: 12  Temp: 97.8 F (36.6 C)   Filed Weights   05/30/17 1005  Weight: 144 lb 14.4 oz (65.7 kg)   Physical Exam  Constitutional: She is oriented to person, place, and time and well-developed, well-nourished, and in no distress. No distress.  HENT:  Head: Normocephalic and atraumatic.  Mouth/Throat: Oropharynx is clear and moist. No oropharyngeal exudate.  Eyes: EOM are normal. Pupils are equal, round, and reactive to light. No scleral icterus.  Neck: Normal range of motion. Neck supple. No JVD present.  Cardiovascular: Regular rhythm and normal heart sounds.  Pulmonary/Chest: Effort normal and breath sounds normal. No respiratory distress. She has no wheezes. She has no rales.  Abdominal: Soft. Bowel sounds are normal. She exhibits no distension.  Musculoskeletal: Normal range of  motion. She exhibits no edema or deformity.  trace edema.  Lymphadenopathy:    She has no cervical adenopathy.  Neurological: She is oriented to person, place, and time. No cranial nerve deficit.  Skin: Skin is dry. She is not diaphoretic. There is erythema.  Erythematous  rash on both upper extremities and back, slightly better. Her chronic lupus rash mainly concentrated on her anterior chest which looks stable.   Psychiatric: Affect and judgment normal.      LABORATORY DATA:  I have reviewed the data as listed Lab Results  Component Value Date   WBC 8.7 05/30/2017   HGB 8.8 (L) 05/30/2017   HCT 27.1 (L) 05/30/2017   MCV 89.9 05/30/2017   PLT 185 05/30/2017   Recent Labs    04/29/17 0320  05/16/17 1340 05/23/17 1232 05/30/17 0938  NA 138   < > 136 136 135  K 3.4*   < > 3.6 3.2* 3.7  CL 101   < > 100* 98* 103  CO2 28   < > 24 27 28   GLUCOSE 87   < > 111* 161* 164*  BUN 13   < > 10 12 19   CREATININE 0.62   < > 0.91 0.89 0.74  CALCIUM 8.3*   < > 8.4* 8.5* 8.7*  GFRNONAA >60   < > 60* >60 >60  GFRAA >60   < > >60 >60 >60  PROT 6.7   < > 7.2 7.1 7.0  ALBUMIN 2.9*   < > 2.8* 2.9* 3.0*  AST 63*   < > 47* 56* 64*  ALT 51   < > 22 30 41  ALKPHOS 297*   < > 326* 317* 277*  BILITOT 3.1*   < > 0.8 0.6 0.5  BILIDIR 1.9*  --   --   --   --   IBILI 1.2*  --   --   --   --    < > = values in this interval not displayed.   [place] CA 19-9   09/14/16 220 --> 09/28/16 176--> 10/12/16 167-->12/07/2016 64-->12/14/2016 60. --> 12/29/2016 87--> 01/26/2017 99-->03/02/2017 53-->03/23/2017 72   RADIOGRAPHIC STUDIES: I have personally reviewed the radiological images as listed and agreed with the findings in the report. CT 12/14/2016 [facility] system Impression: 1. Slight interval decrease in size of the left hepatic lobe cholangiocarcinoma. Slight interval decrease in size of the hypoattenuating left hepatic lobe lesion which now measures 3.6 x 2.8 cm, previously 3.9 x 2.9 cm 2. Slight interval increase in size of a porta hepatis lymph node. Other nodes are slightly smaller. 3. Multiple bilateral thyroid nodules. Recommend ultrasound for further evaluation. 4. Stable mediastinal lymph nodes.  CT chest abdomen pelvis 02/24/2017 comparing to 08/25/16  Study in [facility].  1. No acute findings within the chest, abdomen or pelvis. 2. There has been interval decrease in size of mass associated with left lobe of liver. No specific findings identified to suggest metastatic disease.Mass within the anterior and central aspect of the left lobe appears decreased in size from previous exam measuring 3.0 x 3.2 cm, 3. Bilateral percutaneous biliary drainage catheters are in place without significant biliary ductal dilatation. 4. Bilateral pleural calcifications and chronic interstitial changes of pulmonary fibrosis. Prominent mediastinal lymph nodes are nonspecific in the setting of interstitial lung disease.  CT abdomen pelvis w contrast 04/18/2017 comparing to 11.28/2018 study 2.8 x 2.5 cm hypoenhancing mass in segment 3 of the liver, mildly decreased. Associated atrophy  of the lateral segment left hepatic lobe. Two indwelling biliary stent, unchanged. Small nodes in the porta hepatis measuring up to 11 mm short axis, stable versus mildly decreased. No evidence of new/progressive metastatic disease.  CT abdomen pelvis w contrast 04/29/2017 comparing to 04/18/2017 exam.  Stable exam.  No new or progressive findings. 2.5 x 2.1 cm liver lesion, stable appearance of 2 bilary drains.    ASSESSMENT & PLAN:  Cancer Staging Cholangiocarcinoma [facility]) Staging form: Intrahepatic Bile Duct, AJCC 8th Edition - Clinical stage from 12/29/2016: Stage IV (cT1, cNX, pM1) - Signed by Earlie Server, MD on 12/29/2016  1. Cholangiocarcinoma ([place])   2. Hypokalemia   3. Anemia due to antineoplastic chemotherapy   4. Systemic lupus erythematosus, unspecified SLE type, unspecified organ involvement status ([place])   5. Chest pain, unspecified type    #Discussed with patient that, since she is feeling better, we should consider about continue chemotherapy.  Patient would prefer to have one more week of break and resume chemotherapy next week.   # Rash outbreak: I have  discussed with patient's rheumatologist Dr.Kernodle, and feels that this likely due to lupus flare.  I have encouraged patient previously to continue to follow-up with rheumatology however due to recent admissions/frequent oncology visits, patient has not made an appointment yet.  Her lupus rash mostly concentrated on her anterior chest which has not change significantly.  Rash most likely drug related, secondary to recent antibiotics use.  While on prednisone 20 mg daily, rash appears improving. I refill another week of 20 mg of prednisone, if continues to improve, plan to slowly taper her steroid.  Drug reaction due to recent antibiotics or flare of her lupus.I will increase patient's steroid does to 36m daily to see if this will help. She was on PPI for GI prophylaxis and I will refill her Rx.  Patient will call me at this week and let me know if rash is getting worse or better.   # PBD drainage, currently externally draining. I have discussed with radiology Dr.Yamagata who recommends patient to set up appointment with radiology 8 weeks after her last tube change. Refer to see Dr.Yamagata at the beginning of March.   # Hypokalemia and hypomagnesia: Magnesium sulfate IV 2g x 1 Continue KCL 273m daily and Slow Mag 6469maily. Hypomagnesia is like due to the loss from drainage.  # One episode of chest pain, resolved. Patient declines any further workup for chest pain etiology. She does not want to see cardiology as well.  Continue monitor.   Follow-up in 1 week with repeat labs/possible Gemzar.   Total face to face encounter time for this patient visit was 45 min. >50% of the time was  spent in counseling and coordination of care.  Return of visit: 1 week  ZhoEarlie ServerD, PhD Hematology Oncology [facility]- [phone]

## 2017-05-30 ENCOUNTER — Encounter: Payer: Self-pay | Admitting: Oncology

## 2017-05-30 ENCOUNTER — Inpatient Hospital Stay: Payer: PPO

## 2017-05-30 ENCOUNTER — Other Ambulatory Visit: Payer: Self-pay

## 2017-05-30 ENCOUNTER — Inpatient Hospital Stay (HOSPITAL_BASED_OUTPATIENT_CLINIC_OR_DEPARTMENT_OTHER): Payer: PPO | Admitting: Oncology

## 2017-05-30 VITALS — BP 132/73 | HR 96 | Temp 97.8°F | Resp 12 | Ht 62.0 in | Wt 144.9 lb

## 2017-05-30 DIAGNOSIS — E876 Hypokalemia: Secondary | ICD-10-CM

## 2017-05-30 DIAGNOSIS — R21 Rash and other nonspecific skin eruption: Secondary | ICD-10-CM

## 2017-05-30 DIAGNOSIS — D6481 Anemia due to antineoplastic chemotherapy: Secondary | ICD-10-CM

## 2017-05-30 DIAGNOSIS — C221 Intrahepatic bile duct carcinoma: Secondary | ICD-10-CM

## 2017-05-30 DIAGNOSIS — M329 Systemic lupus erythematosus, unspecified: Secondary | ICD-10-CM | POA: Diagnosis not present

## 2017-05-30 DIAGNOSIS — R079 Chest pain, unspecified: Secondary | ICD-10-CM

## 2017-05-30 DIAGNOSIS — E860 Dehydration: Secondary | ICD-10-CM

## 2017-05-30 DIAGNOSIS — T451X5A Adverse effect of antineoplastic and immunosuppressive drugs, initial encounter: Secondary | ICD-10-CM

## 2017-05-30 LAB — COMPREHENSIVE METABOLIC PANEL
ALT: 41 U/L (ref 14–54)
ANION GAP: 4 — AB (ref 5–15)
AST: 64 U/L — ABNORMAL HIGH (ref 15–41)
Albumin: 3 g/dL — ABNORMAL LOW (ref 3.5–5.0)
Alkaline Phosphatase: 277 U/L — ABNORMAL HIGH (ref 38–126)
BILIRUBIN TOTAL: 0.5 mg/dL (ref 0.3–1.2)
BUN: 19 mg/dL (ref 6–20)
CHLORIDE: 103 mmol/L (ref 101–111)
CO2: 28 mmol/L (ref 22–32)
Calcium: 8.7 mg/dL — ABNORMAL LOW (ref 8.9–10.3)
Creatinine, Ser: 0.74 mg/dL (ref 0.44–1.00)
Glucose, Bld: 164 mg/dL — ABNORMAL HIGH (ref 65–99)
POTASSIUM: 3.7 mmol/L (ref 3.5–5.1)
Sodium: 135 mmol/L (ref 135–145)
TOTAL PROTEIN: 7 g/dL (ref 6.5–8.1)

## 2017-05-30 LAB — CBC WITH DIFFERENTIAL/PLATELET
Basophils Absolute: 0 10*3/uL (ref 0–0.1)
Basophils Relative: 0 %
EOS PCT: 1 %
Eosinophils Absolute: 0.1 10*3/uL (ref 0–0.7)
HEMATOCRIT: 27.1 % — AB (ref 35.0–47.0)
Hemoglobin: 8.8 g/dL — ABNORMAL LOW (ref 12.0–16.0)
LYMPHS PCT: 9 %
Lymphs Abs: 0.7 10*3/uL — ABNORMAL LOW (ref 1.0–3.6)
MCH: 29.2 pg (ref 26.0–34.0)
MCHC: 32.5 g/dL (ref 32.0–36.0)
MCV: 89.9 fL (ref 80.0–100.0)
MONO ABS: 1.1 10*3/uL — AB (ref 0.2–0.9)
MONOS PCT: 12 %
NEUTROS ABS: 6.7 10*3/uL — AB (ref 1.4–6.5)
Neutrophils Relative %: 78 %
PLATELETS: 185 10*3/uL (ref 150–440)
RBC: 3.01 MIL/uL — ABNORMAL LOW (ref 3.80–5.20)
RDW: 15.5 % — AB (ref 11.5–14.5)
WBC: 8.7 10*3/uL (ref 3.6–11.0)

## 2017-05-30 LAB — MAGNESIUM: MAGNESIUM: 1.5 mg/dL — AB (ref 1.7–2.4)

## 2017-05-30 MED ORDER — SODIUM CHLORIDE 0.9 % IV SOLN
INTRAVENOUS | Status: DC | PRN
  Filled 2017-05-30: qty 1000

## 2017-05-30 MED ORDER — SODIUM CHLORIDE 0.9 % IV SOLN
Freq: Once | INTRAVENOUS | Status: AC
  Administered 2017-05-30: 11:00:00 via INTRAVENOUS
  Filled 2017-05-30: qty 1000

## 2017-05-30 MED ORDER — SODIUM CHLORIDE 0.9% FLUSH
10.0000 mL | INTRAVENOUS | Status: DC | PRN
  Filled 2017-05-30: qty 10

## 2017-05-30 MED ORDER — SODIUM CHLORIDE 0.9 % IV SOLN: 2.0000 g | Freq: Once | INTRAVENOUS | Status: DC

## 2017-05-30 MED ORDER — MAGNESIUM SULFATE 2 GM/50ML IV SOLN
2.0000 g | Freq: Once | INTRAVENOUS | Status: AC
  Administered 2017-05-30: 2 g via INTRAVENOUS
  Filled 2017-05-30: qty 50

## 2017-05-30 MED ORDER — PREDNISONE 20 MG PO TABS: 20.0000 mg | ORAL_TABLET | Freq: Every day | ORAL | 0 refills | Status: DC

## 2017-05-30 MED ORDER — HEPARIN SOD (PORK) LOCK FLUSH 100 UNIT/ML IV SOLN
500.0000 [IU] | Freq: Once | INTRAVENOUS | Status: AC
  Administered 2017-05-30: 500 [IU] via INTRAVENOUS

## 2017-05-30 NOTE — Progress Notes (Signed)
Pt to receive Magnesium only today per Dr. Tasia Catchings.

## 2017-05-30 NOTE — Progress Notes (Signed)
Patient here for follow up. States she feels better since taking the prednisone.

## 2017-06-03 ENCOUNTER — Other Ambulatory Visit: Payer: Self-pay | Admitting: *Deleted

## 2017-06-03 ENCOUNTER — Ambulatory Visit: Payer: Self-pay | Admitting: *Deleted

## 2017-06-03 ENCOUNTER — Encounter: Payer: Self-pay | Admitting: *Deleted

## 2017-06-03 NOTE — Patient Outreach (Addendum)
[place] [facility]) Care Management  06/03/2017  Regina Hood Aug 28, 1940 [id number]  Case Closure:  Outreach attempt to patient. No answer. RN CM left HIPAA compliant message along with contact info.   RN CM sent unsuccessful outreach letter to patient on 05/20/17. RN CM will close case due to no response from patient within 10 business days.  Plan:  RN CM will notify [facility] Case Management Assistant regarding case closure.  RN CM will send MD case closure letter. RN CM will send patient case closure letter.   Lake Bells, RN, BSN, MHA/MSL, [place] Telephonic Care Manager Coordinator [facility] Direct Phone: [phone] Cell Phone: [phone] Toll Free: [phone] Fax: [phone]

## 2017-06-06 NOTE — Progress Notes (Signed)
Hematology/Oncology Follow up note [facility] [phone] [phone]  Patient Care Team: Idelle Crouch, MD as PCP - General (Internal Medicine) REASON FOR VISIT Follow up for treatment of cholangiocarcinoma  PERTINENT ONCOLOGY HISTORY Regina Hood 77 y.o.  female with PMH listed as below, most significantly for cholangiocarcinoma who has been on chemotherapy treatment presents to transfer her cancer care to our cancer center.  After extensive medical record review, summary of her diagnosis and treatment history are listed below.  1. Initially presented in 07/2016 to an outside ED with lower and upper GI bleed.  A. She was found to have new LFTs elevations including AST 340, ALT 379, Alk phos 420 and t bili 0.9.   B. [place] was obtained, which did not visualize the left lobe.  C. She was evaluated by gastroenterology because of her history of UC and underwent colonoscopy, which was unremarkable per path report.  2. 08/20/16, CT noted diffuse atrophy of the left lobe with left intrahepatic biliary ductal dilatation, with 4.1 cm ill-defined low-attenuation mass. This mass was suspicious for malignancy.  3. 08/2016, MRI/MRCP which showed abnormal left hepatic lobe with diffuse biliary dilatation with a shrunken left hepatic lobe and a 2.42.9 cm oval-shaped masslike component displacing the adjacent bile ducts. There is an approximately 1.6 cm segment truncated bile ducts involving common hepatic duct and right and left hepatic although no dilatation of the right hepatic duct system was seen.   4. 09/02/16, ERCP showed localized biliary stricture in the left intrahepatic duct with dilation of the left intrahepatic branches. Sphincterotomy was performed and a stent was placed. EUS with a 3.3 cm mass and left duct dilation.  5. Repeat ERCP was done 09/10/16, during which the stent was exchanged.  6. 09/20/16, taken to OR and noted to a have an peritoneal  implant with biopsy c/w adenocarcinoma. The neoplastic cells are positive for CK7. They are negative for CK20, CDX-2, TTF-1, Napsin A, hep-par1, glypican 3, and arginase. The immunohistochemical features are consistent with adenocarcinoma of upper gastrointestinal tract or pancreaticobiliary origin.  7. 09/28/16, initially seen in medical oncology clinic by Dr. Filomena Jungling 8. 09/29/16, admitted for biliary obstruction and cholangitis, s/p PBD placement, d/c 10/06/16 9. 10/12/16, initiated on gemcitabine (400 mg/m2). CA 19-9 167 10. 10/26/16, increased gemcitabine (800 mg/m2) 11. 11/23/2016 Cycle 3 Gemzar (872m/m2), 12/07/2016, Gemzar (8015mm2) added cisplatin 2564m2  12. 12/14/2016, CT CAP shows slight interval decrease in size of L hepatic lobe cholangio. Slight interval increase in size of porta hepatis lymph node whiel other nodes are slightly smaller. Multiple bilateral thyroid nodules. Stable mediastinal lymph nodes. CA 19-9 60 14  8/30 Cycle 4 Gemzar (800m48m) added cisplatin 20mg67m 15, 12/28/2016 Gemcitabine 800mg/42mcisplatin was hold due to anemia. S/p 1 PRBC transfusion.   [facility]   [facility] to [facility] [place]    She forgot her appointment on day 8 and Gemcitabine 800mg/m60md cisplatin 20mg/m240m given  Day 9 of cycle 4 (01/05/2017).        01/19/2017  Cycle 5 Day 1Gemcitabine 800mg/m2,58mplatin 20mg/m2, 62munit blood transfusion.        01/26/2017 Cycle 5 Day 8 Gemcitabine 800mg/m2, c60matin 20mg/m2    60mShe can not tolerate Gemcitabine and Cisplatin due to cytopenia and profound weakness. She was continued on Gemcitabine        single agent. Last chemotherapy was 12/19.  16 She has been chronically on dexamethasone since she started her treatment in [place]. She was originally on 4 mg of dexamethasone when she transition her care to me. I have weaned down to 1 mg, however her skin rash got worse. After recent hospitalization at [facility], the 66m Dexamethasone was  discontinued after discharge. Due to profound weakness and the abrupt stop of Dexamethasone, I start patient on 570mprednisone on 05/16/2017.    17 She has 2 [facility] She spiked fever one week later and had hyperbilirubinemia and was treated for possible infection. Her blood culture was negative for multiple times.     06 May 2017, Patient's PPD has been converted to externally drained [facility] not enough drainage and also abdominal pain.  She called [facility] and was instructed to have labs done locally and if no rising of bilirubin level, no intervention was needed.  At my office, she has had labs done which reviewed normal bilirubin level.  She appears dehydrated and she received a hydration session at out infusion center. .HMarland Kitchenr biliary drains have about 10-20 cc/day output, draining well since she was instructed to flush it.     INTERVAL HISTORY [name]89.o.  female with cholangiocarcinoma follows up for management.  Patient reports that her PPD drain suture came off and she has since then have bile leak draining along her abdominal skin to lower extremity.  She also started to feel right upper quadrant pain last Friday now her pain has become diffuse in her abdomen.  Denies any fever, chills.  She denies any diarrhea.  She did not notify cancer center until she present for her follow-up today. She felt a mild nauseated no vomiting.  She has been taking Plaquenil for cutaneous lupus.  She developed skin rash on upper extremity and back 2 weeks ago.  The skin rash was considered secondary to drug reaction from the multiple antibiotics she received recently.  She has has been on steroids 20 mg daily.  She takes PPI for GI prophylaxis.     Review of Systems  Constitutional: Positive for fatigue. Negative for appetite change, chills and diaphoresis.  HENT:    Negative for hearing loss, nosebleeds and sore throat.   Eyes: Negative for eye problems and icterus.  Respiratory: Negative for cough, hemoptysis and shortness of breath.        Use oxygen as needed at home.  Cardiovascular: Negative for chest pain and leg swelling.  Gastrointestinal: Negative for abdominal pain, blood in stool, constipation and diarrhea.  Endocrine: Negative for hot flashes.  Genitourinary: Negative for difficulty urinating, dyspareunia, dysuria and nocturia.   Musculoskeletal: Negative for arthralgias, back pain, flank pain and myalgias.  Skin: Positive for rash. Negative for itching.        rash on bilateral upper extremities improved slightly.  Neurological: Negative for dizziness, headaches, numbness and seizures.  Hematological: Negative for adenopathy.  Psychiatric/Behavioral: Negative for confusion and depression. The patient is not nervous/anxious.    MEDICAL HISTORY:  Past Medical History:  Diagnosis Date  . Arthritis   . Cancer (HCAspen  . Cellulitis   . Collagen vascular disease ([facility]  . COPD (chronic obstructive pulmonary disease) ([facility]  . DDD (degenerative disc disease), lumbar   . Depression   . Diabetes mellitus   . Diverticulitis   .  GERD (gastroesophageal reflux disease)   . GI bleed   . Headache(784.0)   . Hypertension   . Lupus   . Ulcerative colitis ([place])     SURGICAL HISTORY: Past Surgical History:  Procedure Laterality Date  . ABDOMINAL HYSTERECTOMY    . BREAST BIOPSY Right   . COLONOSCOPY    . COLONOSCOPY WITH PROPOFOL N/A 08/18/2016   Procedure: COLONOSCOPY WITH PROPOFOL;  Surgeon: Manya Silvas, MD;  Location: [facility] ENDOSCOPY;  Service: Endoscopy;  Laterality: N/A;  . ESOPHAGOGASTRODUODENOSCOPY (EGD) WITH PROPOFOL N/A 08/18/2016   Procedure: ESOPHAGOGASTRODUODENOSCOPY (EGD) WITH PROPOFOL;  Surgeon: Manya Silvas, MD;  Location: [facility] ENDOSCOPY;  Service: Endoscopy;  Laterality: N/A;  . ORIF HUMERUS FRACTURE  09/17/2011    Procedure: OPEN REDUCTION INTERNAL FIXATION (ORIF) PROXIMAL HUMERUS FRACTURE;  Surgeon: Augustin Schooling, MD;  Location: [facility];  Service: Orthopedics;  Laterality: Left;    SOCIAL HISTORY: Social History   Socioeconomic History  . Marital status: Widowed    Spouse name: Not on file  . Number of children: Not on file  . Years of education: Not on file  . Highest education level: Not on file  Social Needs  . Financial resource strain: Not on file  . Food insecurity - worry: Not on file  . Food insecurity - inability: Not on file  . Transportation needs - medical: Not on file  . Transportation needs - non-medical: Not on file  Occupational History  . Not on file  Tobacco Use  . Smoking status: Former Smoker    Packs/day: 1.00    Years: 10.00    Pack years: 10.00    Types: Cigarettes    Last attempt to quit: 12/30/1998    Years since quitting: 18.4  . Smokeless tobacco: Never Used  . Tobacco comment: quit smoking in 2003  Substance and Sexual Activity  . Alcohol use: No  . Drug use: No  . Sexual activity: Not on file  Other Topics Concern  . Not on file  Social History Narrative  . Not on file    FAMILY HISTORY: Family History  Problem Relation Age of Onset  . Breast cancer Mother 23  . COPD Father   . Prostate cancer Brother     ALLERGIES:  is allergic to penicillins.  MEDICATIONS:  No current facility-administered medications for this visit.    No current outpatient medications on file.   Facility-Administered Medications Ordered in Other Visits  Medication Dose Route Frequency Provider Last Rate Last Dose  . 0.9 %  sodium chloride infusion   Intravenous PRN Earlie Server, MD   Stopped at 04/27/17 1400  . 0.9 %  sodium chloride infusion   Intravenous Continuous Max Sane, MD      . acetaminophen (TYLENOL) tablet 650 mg  650 mg Oral Q6H PRN Max Sane, MD       Or  . acetaminophen (TYLENOL) suppository 650 mg  650 mg Rectal Q6H PRN Max Sane, MD      .  acetaminophen (TYLENOL) tablet 1,000 mg  1,000 mg Oral TID Max Sane, MD      . amLODipine (NORVASC) tablet 10 mg  10 mg Oral Daily Manuella Ghazi, Vipul, MD      . bisacodyl (DULCOLAX) EC tablet 5 mg  5 mg Oral Daily PRN Max Sane, MD      . docusate sodium (COLACE) capsule 100 mg  100 mg Oral BID Manuella Ghazi, Vipul, MD      . estradiol (ESTRACE) tablet 1 mg  1 mg Oral Daily Max Sane, MD      . gabapentin (NEURONTIN) capsule 100 mg  100 mg Oral BID Max Sane, MD      . heparin injection 5,000 Units  5,000 Units Subcutaneous Q8H Manuella Ghazi, Vipul, MD      . heparin lock flush 100 unit/mL  500 Units Intravenous Once Earlie Server, MD      . HYDROcodone-acetaminophen (NORCO/VICODIN) 5-325 MG per tablet 1-2 tablet  1-2 tablet Oral Q4H PRN Max Sane, MD      . hydroxychloroquine (PLAQUENIL) tablet 200 mg  200 mg Oral BID Max Sane, MD      . magnesium chloride (SLOW-MAG) 64 MG SR tablet 64 mg  1 tablet Oral Daily Max Sane, MD      . Medical Compression Stockings MISC 2 each  2 each Does not apply Daily Manuella Ghazi, Vipul, MD      . metFORMIN (GLUCOPHAGE) tablet 500 mg  500 mg Oral BID WC Manuella Ghazi, Vipul, MD      . metoprolol tartrate (LOPRESSOR) tablet 25 mg  25 mg Oral BID Max Sane, MD      . ondansetron (ZOFRAN) tablet 4 mg  4 mg Oral Q6H PRN Max Sane, MD       Or  . ondansetron (ZOFRAN) injection 4 mg  4 mg Intravenous Q6H PRN Max Sane, MD      . pantoprazole (PROTONIX) EC tablet 40 mg  40 mg Oral Daily Manuella Ghazi, Vipul, MD      . potassium chloride SA (K-DUR,KLOR-CON) CR tablet 20 mEq  20 mEq Oral Daily Max Sane, MD      . Derrill Memo ON 06/08/2017] predniSONE (DELTASONE) tablet 20 mg  20 mg Oral Q breakfast Max Sane, MD      . traZODone (DESYREL) tablet 25 mg  25 mg Oral QHS PRN Max Sane, MD      . venlafaxine XR (EFFEXOR-XR) 24 hr capsule 225 mg  225 mg Oral Daily Manuella Ghazi, Vipul, MD      . vitamin B-12 (CYANOCOBALAMIN) tablet 1,000 mcg  1,000 mcg Oral Daily Manuella Ghazi, Vipul, MD          .  PHYSICAL  EXAMINATION: ECOG PERFORMANCE STATUS: 2 - Symptomatic, <50% confined to bed Vitals:   06/07/17 0849  BP: 136/76  Pulse: 97  Resp: 18  Temp: (!) 97.5 F (36.4 C)   There were no vitals filed for this visit. Physical Exam  Constitutional: She is oriented to person, place, and time and well-developed, well-nourished, and in no distress. No distress.  HENT:  Head: Normocephalic and atraumatic.  Mouth/Throat: Oropharynx is clear and moist. No oropharyngeal exudate.  Eyes: EOM are normal. Pupils are equal, round, and reactive to light. No scleral icterus.  Neck: Normal range of motion. Neck supple. No JVD present.  Cardiovascular: Regular rhythm and normal heart sounds.  Pulmonary/Chest: Effort normal and breath sounds normal. No respiratory distress. She has no wheezes. She has no rales.  Abdominal: Soft. Bowel sounds are normal. She exhibits no distension. There is tenderness.  Right PBD drain suture came off and appears dislocated.  Bile leak,   Musculoskeletal: Normal range of motion. She exhibits no edema or deformity.  trace edema.  Lymphadenopathy:    She has no cervical adenopathy.  Neurological: She is oriented to person, place, and time. No cranial nerve deficit.  Skin: Skin is dry. She is not diaphoretic. There is erythema.  Erythematous rash on both upper extremities and back, slightly better. Her chronic lupus  rash mainly concentrated on her anterior chest which looks stable.   Psychiatric: Affect and judgment normal.      LABORATORY DATA:  I have reviewed the data as listed Lab Results  Component Value Date   WBC 8.7 05/30/2017   HGB 8.8 (L) 05/30/2017   HCT 27.1 (L) 05/30/2017   MCV 89.9 05/30/2017   PLT 185 05/30/2017   Recent Labs    04/29/17 0320  05/16/17 1340 05/23/17 1232 05/30/17 0938  NA 138   < > 136 136 135  K 3.4*   < > 3.6 3.2* 3.7  CL 101   < > 100* 98* 103  CO2 28   < > _0 GLUCOSE 87   < > 111* 161* 164*  BUN 13   < > _1 CREATININE 0.62   < > 0.91 0.89 0.74  CALCIUM 8.3*   < > 8.4* 8.5* 8.7*  GFRNONAA >60   < > 60* >60 >60  GFRAA >60   < > >60 >60 >60  PROT 6.7   < > 7.2 7.1 7.0  ALBUMIN 2.9*   < > 2.8* 2.9* 3.0*  AST 63*   < > 47* 56* 64*  ALT 51   < > 22 30 41  ALKPHOS 297*   < > 326* 317* 277*  BILITOT 3.1*   < > 0.8 0.6 0.5  BILIDIR 1.9*  --   --   --   --   IBILI 1.2*  --   --   --   --    < > = values in this interval not displayed.   [place] CA 19-9   09/14/16 220 --> 09/28/16 176--> 10/12/16 167-->12/07/2016 64-->12/14/2016 60. --> 12/29/2016 87--> 01/26/2017 99-->03/02/2017 53-->03/23/2017 72   RADIOGRAPHIC STUDIES: I have personally reviewed the radiological images as listed and agreed with the findings in the report. CT 12/14/2016 [facility] Impression: 1. Slight interval decrease in size of the left hepatic lobe cholangiocarcinoma. Slight interval decrease in size of the hypoattenuating left hepatic lobe lesion which now measures 3.6 x 2.8 cm, previously 3.9 x 2.9 cm 2. Slight interval increase in size of a porta hepatis lymph node. Other nodes are slightly smaller. 3. Multiple bilateral thyroid nodules. Recommend ultrasound for further evaluation. 4. Stable mediastinal lymph nodes.  CT chest abdomen pelvis 02/24/2017 comparing to 08/25/16 Study in [facility].  1. No acute findings within the chest, abdomen or pelvis. 2. There has been interval decrease in size of mass associated with left lobe of liver. No specific findings identified to suggest metastatic disease.Mass within the anterior and central aspect of the left lobe appears decreased in size from previous exam measuring 3.0 x 3.2 cm, 3. Bilateral percutaneous biliary drainage catheters are in place without significant biliary ductal dilatation. 4. Bilateral pleural calcifications and chronic interstitial changes of pulmonary fibrosis. Prominent mediastinal lymph nodes are nonspecific in the setting of  interstitial lung disease.  CT abdomen pelvis w contrast 04/18/2017 comparing to 11.28/2018 study 2.8 x 2.5 cm hypoenhancing mass in segment 3 of the liver, mildly decreased. Associated atrophy of the lateral segment left hepatic lobe. Two indwelling biliary stent, unchanged. Small nodes in the porta hepatis measuring up to 11 mm short axis, stable versus mildly decreased. No evidence of new/progressive metastatic disease.  CT abdomen pelvis w contrast 04/29/2017 comparing to 04/18/2017 exam.  Stable exam.  No new or progressive findings. 2.5 x 2.1 cm  liver lesion, stable appearance of 2 bilary drains.    ASSESSMENT & PLAN:  Cancer Staging Cholangiocarcinoma [facility]) Staging form: Intrahepatic Bile Duct, AJCC 8th Edition - Clinical stage from 12/29/2016: Stage IV (cT1, cNX, pM1) - Signed by Earlie Server, MD on 12/29/2016  1. Cholangiocarcinoma ([place])   2. Systemic lupus erythematosus, unspecified SLE type, unspecified organ involvement status ([place])   3. Hypomagnesemia    #Hold Chemotherapy today.  # Abdominal Pain/PBD dislocation: will arrange patient to be admitted to hospital. I have discussed her case with Dr.Konidena. Patient will get lab work including blood culture, also CT Abdomen for further evaluation, consult Interventional Radiology.  I have discussed this case with Radiology Dr.Yamagata who patient has an outpatient appointment with for PBD exchange in March. Dr.Yamagata will speak to IR physician who covers today.  She is afebrile and no leukocytosis.   # Rash outbreak: I have discussed with patient's rheumatologist Dr.Kernodle, and feels that this likely due to lupus flare.  Her lupus rash mostly concentrated on her anterior chest which has not change significantly. Rash most likely drug related, secondary to recent antibiotics use.  While on prednisone 20 mg daily, rash appears improving. Will continue her on Prednisone 23m daily.  Continue  PPI for GI prophylaxis.    #  hypomagnesia secondary to loss of bile: give Magnesium sulfate IV 2g x 1 today at infusion center.  Continue oral KCL 286m daily and Slow Mag 6420maily. Hypomagnesia is like due to the loss from drainage.   Follow-up after discharge from hospital.    Total face to face encounter time for this patient visit was 45 min. >50% of the time was  spent in counseling and coordination of care.  Return of visit: 1 week  ZhoEarlie ServerD, PhD Hematology [facility]cology [facility] [id number]

## 2017-06-07 ENCOUNTER — Inpatient Hospital Stay: Payer: PPO

## 2017-06-07 ENCOUNTER — Inpatient Hospital Stay (HOSPITAL_BASED_OUTPATIENT_CLINIC_OR_DEPARTMENT_OTHER): Payer: PPO | Admitting: Oncology

## 2017-06-07 ENCOUNTER — Inpatient Hospital Stay
Admission: AD | Admit: 2017-06-07 | Discharge: 2017-06-09 | DRG: 919 | Disposition: A | Discharge status: Home / Self Care | Payer: PPO | Source: Ambulatory Visit | Attending: Internal Medicine | Admitting: Internal Medicine

## 2017-06-07 ENCOUNTER — Encounter: Payer: Self-pay | Admitting: Oncology

## 2017-06-07 ENCOUNTER — Other Ambulatory Visit: Payer: Self-pay

## 2017-06-07 VITALS — BP 136/76 | HR 97 | Temp 97.5°F | Resp 18 | Ht 62.0 in | Wt 150.5 lb

## 2017-06-07 DIAGNOSIS — Z66 Do not resuscitate: Secondary | ICD-10-CM | POA: Diagnosis not present

## 2017-06-07 DIAGNOSIS — R1011 Right upper quadrant pain: Secondary | ICD-10-CM | POA: Diagnosis not present

## 2017-06-07 DIAGNOSIS — Z880 Allergy status to penicillin: Secondary | ICD-10-CM | POA: Diagnosis not present

## 2017-06-07 DIAGNOSIS — Z803 Family history of malignant neoplasm of breast: Secondary | ICD-10-CM

## 2017-06-07 DIAGNOSIS — Z87891 Personal history of nicotine dependence: Secondary | ICD-10-CM | POA: Diagnosis not present

## 2017-06-07 DIAGNOSIS — F329 Major depressive disorder, single episode, unspecified: Secondary | ICD-10-CM | POA: Diagnosis not present

## 2017-06-07 DIAGNOSIS — C221 Intrahepatic bile duct carcinoma: Secondary | ICD-10-CM

## 2017-06-07 DIAGNOSIS — M329 Systemic lupus erythematosus, unspecified: Secondary | ICD-10-CM | POA: Diagnosis present

## 2017-06-07 DIAGNOSIS — R109 Unspecified abdominal pain: Secondary | ICD-10-CM

## 2017-06-07 DIAGNOSIS — Z8042 Family history of malignant neoplasm of prostate: Secondary | ICD-10-CM

## 2017-06-07 DIAGNOSIS — J449 Chronic obstructive pulmonary disease, unspecified: Secondary | ICD-10-CM | POA: Diagnosis not present

## 2017-06-07 DIAGNOSIS — Z79899 Other long term (current) drug therapy: Secondary | ICD-10-CM

## 2017-06-07 DIAGNOSIS — Z434 Encounter for attention to other artificial openings of digestive tract: Secondary | ICD-10-CM | POA: Diagnosis not present

## 2017-06-07 DIAGNOSIS — D6481 Anemia due to antineoplastic chemotherapy: Secondary | ICD-10-CM | POA: Diagnosis not present

## 2017-06-07 DIAGNOSIS — C220 Liver cell carcinoma: Secondary | ICD-10-CM | POA: Diagnosis not present

## 2017-06-07 DIAGNOSIS — K219 Gastro-esophageal reflux disease without esophagitis: Secondary | ICD-10-CM | POA: Diagnosis not present

## 2017-06-07 DIAGNOSIS — L282 Other prurigo: Secondary | ICD-10-CM

## 2017-06-07 DIAGNOSIS — E119 Type 2 diabetes mellitus without complications: Secondary | ICD-10-CM | POA: Diagnosis present

## 2017-06-07 DIAGNOSIS — E876 Hypokalemia: Secondary | ICD-10-CM | POA: Diagnosis not present

## 2017-06-07 DIAGNOSIS — Z7984 Long term (current) use of oral hypoglycemic drugs: Secondary | ICD-10-CM

## 2017-06-07 DIAGNOSIS — I10 Essential (primary) hypertension: Secondary | ICD-10-CM | POA: Diagnosis present

## 2017-06-07 DIAGNOSIS — K838 Other specified diseases of biliary tract: Secondary | ICD-10-CM | POA: Diagnosis not present

## 2017-06-07 DIAGNOSIS — M359 Systemic involvement of connective tissue, unspecified: Secondary | ICD-10-CM | POA: Diagnosis present

## 2017-06-07 DIAGNOSIS — R17 Unspecified jaundice: Secondary | ICD-10-CM

## 2017-06-07 DIAGNOSIS — T85628A Displacement of other specified internal prosthetic devices, implants and grafts, initial encounter: Principal | ICD-10-CM | POA: Diagnosis present

## 2017-06-07 DIAGNOSIS — K5900 Constipation, unspecified: Secondary | ICD-10-CM | POA: Diagnosis not present

## 2017-06-07 DIAGNOSIS — Z6826 Body mass index (BMI) 26.0-26.9, adult: Secondary | ICD-10-CM | POA: Diagnosis not present

## 2017-06-07 DIAGNOSIS — E440 Moderate protein-calorie malnutrition: Secondary | ICD-10-CM | POA: Diagnosis present

## 2017-06-07 DIAGNOSIS — E860 Dehydration: Secondary | ICD-10-CM

## 2017-06-07 DIAGNOSIS — R21 Rash and other nonspecific skin eruption: Secondary | ICD-10-CM | POA: Diagnosis not present

## 2017-06-07 DIAGNOSIS — K831 Obstruction of bile duct: Secondary | ICD-10-CM | POA: Diagnosis not present

## 2017-06-07 DIAGNOSIS — T85638A Leakage of other specified internal prosthetic devices, implants and grafts, initial encounter: Secondary | ICD-10-CM | POA: Diagnosis present

## 2017-06-07 DIAGNOSIS — R1084 Generalized abdominal pain: Secondary | ICD-10-CM

## 2017-06-07 LAB — COMPREHENSIVE METABOLIC PANEL
ALBUMIN: 3.2 g/dL — AB (ref 3.5–5.0)
ALK PHOS: 337 U/L — AB (ref 38–126)
ALT: 75 U/L — AB (ref 14–54)
ALT: 81 U/L — AB (ref 14–54)
AST: 72 U/L — AB (ref 15–41)
AST: 73 U/L — ABNORMAL HIGH (ref 15–41)
Albumin: 3.5 g/dL (ref 3.5–5.0)
Alkaline Phosphatase: 323 U/L — ABNORMAL HIGH (ref 38–126)
Anion gap: 9 (ref 5–15)
Anion gap: 13 (ref 5–15)
BILIRUBIN TOTAL: 0.6 mg/dL (ref 0.3–1.2)
BUN: 20 mg/dL (ref 6–20)
BUN: 19 mg/dL (ref 6–20)
CALCIUM: 9.4 mg/dL (ref 8.9–10.3)
CHLORIDE: 100 mmol/L — AB (ref 101–111)
CO2: 26 mmol/L (ref 22–32)
CO2: 26 mmol/L (ref 22–32)
CREATININE: 0.64 mg/dL (ref 0.44–1.00)
CREATININE: 0.63 mg/dL (ref 0.44–1.00)
Calcium: 9 mg/dL (ref 8.9–10.3)
Chloride: 97 mmol/L — ABNORMAL LOW (ref 101–111)
GFR calc Af Amer: >60 mL/min (ref >60)
GFR calc non Af Amer: >60 mL/min (ref >60)
GLUCOSE: 209 mg/dL — AB (ref 65–99)
Glucose, Bld: 107 mg/dL — ABNORMAL HIGH (ref 65–99)
Potassium: 3.9 mmol/L (ref 3.5–5.1)
Potassium: 4.1 mmol/L (ref 3.5–5.1)
SODIUM: 135 mmol/L (ref 135–145)
Sodium: 136 mmol/L (ref 135–145)
Total Bilirubin: 0.5 mg/dL (ref 0.3–1.2)
Total Protein: 7.2 g/dL (ref 6.5–8.1)
Total Protein: 7.7 g/dL (ref 6.5–8.1)

## 2017-06-07 LAB — MAGNESIUM: MAGNESIUM: 1.5 mg/dL — AB (ref 1.7–2.4)

## 2017-06-07 LAB — CBC WITH DIFFERENTIAL/PLATELET
Basophils Absolute: 0 10*3/uL (ref 0–0.1)
Basophils Relative: 0 %
EOS ABS: 0.2 10*3/uL (ref 0–0.7)
EOS PCT: 2 %
HCT: 28.2 % — ABNORMAL LOW (ref 35.0–47.0)
HEMOGLOBIN: 9.3 g/dL — AB (ref 12.0–16.0)
LYMPHS ABS: 0.8 10*3/uL — AB (ref 1.0–3.6)
Lymphocytes Relative: 8 %
MCH: 29.6 pg (ref 26.0–34.0)
MCHC: 32.9 g/dL (ref 32.0–36.0)
MCV: 90 fL (ref 80.0–100.0)
MONO ABS: 1.3 10*3/uL — AB (ref 0.2–0.9)
MONOS PCT: 13 %
NEUTROS PCT: 77 %
Neutro Abs: 8 10*3/uL — ABNORMAL HIGH (ref 1.4–6.5)
Platelets: 187 10*3/uL (ref 150–440)
RBC: 3.13 MIL/uL — ABNORMAL LOW (ref 3.80–5.20)
RDW: 15.6 % — ABNORMAL HIGH (ref 11.5–14.5)
WBC: 10.3 10*3/uL (ref 3.6–11.0)

## 2017-06-07 LAB — GLUCOSE, CAPILLARY
GLUCOSE-CAPILLARY: 143 mg/dL — AB (ref 65–99)
Glucose-Capillary: 108 mg/dL — ABNORMAL HIGH (ref 65–99)

## 2017-06-07 LAB — HEMOGLOBIN A1C
HEMOGLOBIN A1C: 6.1 % — AB (ref 4.8–5.6)
Mean Plasma Glucose: 128.37 mg/dL

## 2017-06-07 LAB — TSH: TSH: 2.916 u[IU]/mL (ref 0.350–4.500)

## 2017-06-07 MED ORDER — BISACODYL 5 MG PO TBEC: 5.0000 mg | DELAYED_RELEASE_TABLET | Freq: Every day | ORAL | Status: DC | PRN

## 2017-06-07 MED ORDER — ONDANSETRON HCL 4 MG PO TABS
4.0000 mg | ORAL_TABLET | Freq: Four times a day (QID) | ORAL | Status: DC | PRN
  Administered 2017-06-09: 09:00:00 4 mg via ORAL
  Filled 2017-06-07: qty 1

## 2017-06-07 MED ORDER — INSULIN ASPART 100 UNIT/ML ~~LOC~~ SOLN
0.0000 [IU] | Freq: Three times a day (TID) | SUBCUTANEOUS | Status: DC
Start: 2017-06-07 — End: 2017-06-09
  Administered 2017-06-08: 13:00:00 3 [IU] via SUBCUTANEOUS
  Administered 2017-06-08: 1 [IU] via SUBCUTANEOUS
  Filled 2017-06-07 (×2): qty 1

## 2017-06-07 MED ORDER — VENLAFAXINE HCL ER 75 MG PO CP24
225.0000 mg | ORAL_CAPSULE | Freq: Every day | ORAL | Status: DC
  Administered 2017-06-07 – 2017-06-09 (×3): 225 mg via ORAL
  Filled 2017-06-07 (×3): qty 3

## 2017-06-07 MED ORDER — HYDROXYCHLOROQUINE SULFATE 200 MG PO TABS
200.0000 mg | ORAL_TABLET | Freq: Two times a day (BID) | ORAL | Status: DC
  Administered 2017-06-07 – 2017-06-09 (×5): 200 mg via ORAL
  Filled 2017-06-07 (×6): qty 1

## 2017-06-07 MED ORDER — HYDROCODONE-ACETAMINOPHEN 5-325 MG PO TABS
1.0000 | ORAL_TABLET | ORAL | Status: DC | PRN
  Filled 2017-06-07: qty 1

## 2017-06-07 MED ORDER — AMLODIPINE BESYLATE 10 MG PO TABS
10.0000 mg | ORAL_TABLET | Freq: Every day | ORAL | Status: DC
  Administered 2017-06-07 – 2017-06-09 (×3): 10 mg via ORAL
  Filled 2017-06-07 (×3): qty 1

## 2017-06-07 MED ORDER — MEDICAL COMPRESSION STOCKINGS MISC: 2.0000 | Freq: Every day | Status: DC

## 2017-06-07 MED ORDER — HEPARIN SOD (PORK) LOCK FLUSH 100 UNIT/ML IV SOLN
500.0000 [IU] | Freq: Once | INTRAVENOUS | Status: AC
  Administered 2017-06-07: 500 [IU] via INTRAVENOUS

## 2017-06-07 MED ORDER — IOPAMIDOL (ISOVUE-300) INJECTION 61%
100.0000 mL | Freq: Once | INTRAVENOUS | Status: AC | PRN
  Administered 2017-06-07: 100 mL via INTRAVENOUS

## 2017-06-07 MED ORDER — SENNOSIDES-DOCUSATE SODIUM 8.6-50 MG PO TABS
2.0000 | ORAL_TABLET | Freq: Two times a day (BID) | ORAL | Status: DC
  Administered 2017-06-07 – 2017-06-09 (×4): 2 via ORAL
  Filled 2017-06-07 (×4): qty 2

## 2017-06-07 MED ORDER — VITAMIN B-12 1000 MCG PO TABS
1000.0000 ug | ORAL_TABLET | Freq: Every day | ORAL | Status: DC
  Administered 2017-06-07 – 2017-06-09 (×3): 1000 ug via ORAL
  Filled 2017-06-07 (×3): qty 1

## 2017-06-07 MED ORDER — ONDANSETRON HCL 4 MG/2ML IJ SOLN: 8.0000 mg | Freq: Once | INTRAMUSCULAR | Status: DC

## 2017-06-07 MED ORDER — ESTRADIOL 1 MG PO TABS
1.0000 mg | ORAL_TABLET | Freq: Every day | ORAL | Status: DC
  Administered 2017-06-07 – 2017-06-09 (×3): 1 mg via ORAL
  Filled 2017-06-07 (×3): qty 1

## 2017-06-07 MED ORDER — SODIUM CHLORIDE 0.9 % IV SOLN: Freq: Once | INTRAVENOUS | Status: DC

## 2017-06-07 MED ORDER — HEPARIN SOD (PORK) LOCK FLUSH 100 UNIT/ML IV SOLN
INTRAVENOUS | Status: AC
  Filled 2017-06-07: qty 5

## 2017-06-07 MED ORDER — POTASSIUM CHLORIDE CRYS ER 20 MEQ PO TBCR
20.0000 meq | EXTENDED_RELEASE_TABLET | Freq: Every day | ORAL | Status: DC
  Administered 2017-06-07 – 2017-06-09 (×3): 20 meq via ORAL
  Filled 2017-06-07 (×3): qty 1

## 2017-06-07 MED ORDER — INSULIN ASPART 100 UNIT/ML ~~LOC~~ SOLN
0.0000 [IU] | Freq: Every day | SUBCUTANEOUS | Status: DC
  Administered 2017-06-08: 21:00:00 2 [IU] via SUBCUTANEOUS
  Filled 2017-06-07: qty 1

## 2017-06-07 MED ORDER — ACETAMINOPHEN 500 MG PO TABS
1000.0000 mg | ORAL_TABLET | Freq: Three times a day (TID) | ORAL | Status: DC
  Administered 2017-06-07 – 2017-06-09 (×6): 1000 mg via ORAL
  Filled 2017-06-07 (×6): qty 2

## 2017-06-07 MED ORDER — METOPROLOL TARTRATE 25 MG PO TABS
25.0000 mg | ORAL_TABLET | Freq: Two times a day (BID) | ORAL | Status: DC
  Administered 2017-06-07 – 2017-06-09 (×5): 25 mg via ORAL
  Filled 2017-06-07 (×5): qty 1

## 2017-06-07 MED ORDER — PANTOPRAZOLE SODIUM 40 MG PO TBEC
40.0000 mg | DELAYED_RELEASE_TABLET | Freq: Every day | ORAL | Status: DC
  Administered 2017-06-07 – 2017-06-09 (×3): 40 mg via ORAL
  Filled 2017-06-07 (×3): qty 1

## 2017-06-07 MED ORDER — IOPAMIDOL (ISOVUE-300) INJECTION 61%
15.0000 mL | INTRAVENOUS | Status: AC
  Administered 2017-06-07 (×2): 15 mL via ORAL

## 2017-06-07 MED ORDER — TRAZODONE HCL 50 MG PO TABS
25.0000 mg | ORAL_TABLET | Freq: Every evening | ORAL | Status: DC | PRN
  Administered 2017-06-07: 25 mg via ORAL
  Filled 2017-06-07 (×2): qty 1

## 2017-06-07 MED ORDER — MAGNESIUM CHLORIDE 64 MG PO TBEC
1.0000 | DELAYED_RELEASE_TABLET | Freq: Every day | ORAL | Status: DC
  Administered 2017-06-07 – 2017-06-09 (×3): 64 mg via ORAL
  Filled 2017-06-07 (×3): qty 1

## 2017-06-07 MED ORDER — SODIUM CHLORIDE 0.9% FLUSH
10.0000 mL | INTRAVENOUS | Status: DC | PRN
  Administered 2017-06-07: 10 mL via INTRAVENOUS
  Filled 2017-06-07: qty 10

## 2017-06-07 MED ORDER — DOCUSATE SODIUM 100 MG PO CAPS
100.0000 mg | ORAL_CAPSULE | Freq: Two times a day (BID) | ORAL | Status: DC
  Administered 2017-06-07: 14:00:00 100 mg via ORAL
  Filled 2017-06-07: qty 1

## 2017-06-07 MED ORDER — ACETAMINOPHEN 650 MG RE SUPP: 650.0000 mg | Freq: Four times a day (QID) | RECTAL | Status: DC | PRN

## 2017-06-07 MED ORDER — SODIUM CHLORIDE 0.9 % IV SOLN: 2.0000 g | Freq: Once | INTRAVENOUS | Status: DC

## 2017-06-07 MED ORDER — METFORMIN HCL 500 MG PO TABS
500.0000 mg | ORAL_TABLET | Freq: Two times a day (BID) | ORAL | Status: DC
  Administered 2017-06-07 – 2017-06-09 (×4): 500 mg via ORAL
  Filled 2017-06-07 (×5): qty 1

## 2017-06-07 MED ORDER — HEPARIN SODIUM (PORCINE) 5000 UNIT/ML IJ SOLN
5000.0000 [IU] | Freq: Three times a day (TID) | INTRAMUSCULAR | Status: DC
  Administered 2017-06-07 – 2017-06-09 (×5): 5000 [IU] via SUBCUTANEOUS
  Filled 2017-06-07 (×4): qty 1

## 2017-06-07 MED ORDER — MAGNESIUM SULFATE 2 GM/50ML IV SOLN
2.0000 g | Freq: Once | INTRAVENOUS | Status: AC
  Administered 2017-06-07: 2 g via INTRAVENOUS
  Filled 2017-06-07: qty 50

## 2017-06-07 MED ORDER — PREDNISONE 20 MG PO TABS
20.0000 mg | ORAL_TABLET | Freq: Every day | ORAL | Status: DC
  Administered 2017-06-08: 10:00:00 20 mg via ORAL
  Filled 2017-06-07: qty 1

## 2017-06-07 MED ORDER — SODIUM CHLORIDE 0.9 % IV SOLN
INTRAVENOUS | Status: DC | PRN
  Administered 2017-06-07: 10:00:00 via INTRAVENOUS
  Filled 2017-06-07: qty 1000

## 2017-06-07 MED ORDER — ACETAMINOPHEN 325 MG PO TABS: 650.0000 mg | ORAL_TABLET | Freq: Four times a day (QID) | ORAL | Status: DC | PRN

## 2017-06-07 MED ORDER — ONDANSETRON HCL 4 MG/2ML IJ SOLN
4.0000 mg | Freq: Four times a day (QID) | INTRAMUSCULAR | Status: DC | PRN
  Administered 2017-06-08: 4 mg via INTRAVENOUS
  Filled 2017-06-07: qty 2

## 2017-06-07 MED ORDER — SODIUM CHLORIDE 0.9 % IV SOLN
INTRAVENOUS | Status: DC
  Administered 2017-06-07 – 2017-06-08 (×2): via INTRAVENOUS

## 2017-06-07 MED ORDER — GABAPENTIN 100 MG PO CAPS
100.0000 mg | ORAL_CAPSULE | Freq: Two times a day (BID) | ORAL | Status: DC
  Administered 2017-06-07 – 2017-06-09 (×5): 100 mg via ORAL
  Filled 2017-06-07 (×5): qty 1

## 2017-06-07 NOTE — Progress Notes (Signed)
PHARMACIST - PHYSICIAN ORDER COMMUNICATION  DESCRIPTION:  This patient's order for:  Medical Compression Stockings  has been noted.  This is not a pharmacy item. Cannot be dispensed from pharmacy.    ACTION TAKEN: The pharmacy department is unable to verify this order at this time.  Please reevaluate patient's clinical condition at discharge and address if thisl product should be resumed at that time.

## 2017-06-07 NOTE — Progress Notes (Signed)
Red rash noted to bilateral forearm, patient states from antibiotic. Pain noted drainage site/ abdomen

## 2017-06-07 NOTE — Progress Notes (Signed)
When pt arrived to room 113, VSS, RN assessed skin and saw that pt has a rash on her arms bilateral and on her back. Pt states the rash has been present on her body for a couple of weeks and that her primary doctor is treating her rash with prednisone. Pt does not known what medication caused this reaction. Pt states that she does not have any shortness of breath or problems breathing.   Ashden Sonnenberg [employer]

## 2017-06-07 NOTE — Progress Notes (Signed)
Patient ID: Regina Hood, female   DOB: [birth year], 77 y.o.   MRN: [id number] IR was asked to evaluate leaking biliary drains. Patient with cholangiocarcinoma and 2 biliary drains placed at [facility]. Normal bilirubin today. I evaluated the drains and the right drain has been pulled back and a side hole is exposed.  Drain position explains why there has been bile leaking on skin. I secured the drain to patient and will plan for drain exchange tomorrow with fluoroscopy. Patient would like to get sedation for the drain exchange, therefore, will make her NPO after midnight.

## 2017-06-07 NOTE — H&P (Signed)
[place] [facility] [place] NAME: Regina Hood    MR#:  [id number]  DATE OF BIRTH:  26-Aug-1940  DATE OF ADMISSION:  06/07/2017  PRIMARY CARE PHYSICIAN: Idelle Crouch, MD   REQUESTING/REFERRING PHYSICIAN: Earlie Server MD  CHIEF COMPLAINT:  No chief complaint on file. Abd pain  HISTORY OF PRESENT ILLNESS:  Regina Hood  is a 77 y.o. female with a known history of cholangiocarcinoma being admitted for abd pain likely due to bile leakage from drain. This is a direct admit from cancer center by Dr Tasia Catchings.  PAST MEDICAL HISTORY:   Past Medical History:  Diagnosis Date  . Arthritis   . Cancer ([place])   . Cellulitis   . Collagen vascular disease ([place])   . COPD (chronic obstructive pulmonary disease) ([place])   . DDD (degenerative disc disease), lumbar   . Depression   . Diabetes mellitus   . Diverticulitis   . GERD (gastroesophageal reflux disease)   . GI bleed   . Headache(784.0)   . Hypertension   . Lupus   . Ulcerative colitis ([place])     PAST SURGICAL HISTORY:   Past Surgical History:  Procedure Laterality Date  . ABDOMINAL HYSTERECTOMY    . BREAST BIOPSY Right   . COLONOSCOPY    . COLONOSCOPY WITH PROPOFOL N/A 08/18/2016   Procedure: COLONOSCOPY WITH PROPOFOL;  Surgeon: Manya Silvas, MD;  Location: [facility] ENDOSCOPY;  Service: Endoscopy;  Laterality: N/A;  . ESOPHAGOGASTRODUODENOSCOPY (EGD) WITH PROPOFOL N/A 08/18/2016   Procedure: ESOPHAGOGASTRODUODENOSCOPY (EGD) WITH PROPOFOL;  Surgeon: Manya Silvas, MD;  Location: [facility] ENDOSCOPY;  Service: Endoscopy;  Laterality: N/A;  . ORIF HUMERUS FRACTURE  09/17/2011   Procedure: OPEN REDUCTION INTERNAL FIXATION (ORIF) PROXIMAL HUMERUS FRACTURE;  Surgeon: Augustin Schooling, MD;  Location: [place];  Service: Orthopedics;  Laterality: Left;    SOCIAL HISTORY:   Social History   Tobacco Use  . Smoking status: Former Smoker    Packs/day: 1.00    Years: 10.00    Pack years: 10.00    Types: Cigarettes   Last attempt to quit: 12/30/1998    Years since quitting: 18.4  . Smokeless tobacco: Never Used  . Tobacco comment: quit smoking in 2003  Substance Use Topics  . Alcohol use: No    FAMILY HISTORY:   Family History  Problem Relation Age of Onset  . Breast cancer Mother 34  . COPD Father   . Prostate cancer Brother     DRUG ALLERGIES:   Allergies  Allergen Reactions  . Penicillins Nausea Only    REVIEW OF SYSTEMS:   Review of Systems  Constitutional: Negative for chills, fever and weight loss.  HENT: Negative for nosebleeds and sore throat.   Eyes: Negative for blurred vision.  Respiratory: Negative for cough, shortness of breath and wheezing.   Cardiovascular: Negative for chest pain, orthopnea, leg swelling and PND.  Gastrointestinal: Positive for abdominal pain. Negative for constipation, diarrhea, heartburn, nausea and vomiting.  Genitourinary: Negative for dysuria and urgency.  Musculoskeletal: Negative for back pain.  Skin: Negative for rash.  Neurological: Negative for dizziness, speech change, focal weakness and headaches.  Endo/Heme/Allergies: Does not bruise/bleed easily.  Psychiatric/Behavioral: Negative for depression.    MEDICATIONS AT HOME:   Prior to Admission medications   Medication Sig Start Date End Date Taking? Authorizing Provider  acetaminophen (TYLENOL) 500 MG tablet Take 1,000 mg by mouth 3 (three) times daily.   Yes [provider]  amLODipine (NORVASC) 10 MG tablet Take 1 tablet (10 mg total) by mouth daily. 04/30/17  Yes Gouru, Illene Silver, MD  calcium carbonate 100 mg/ml SUSP Take by mouth daily.    Yes [provider]  Cyanocobalamin (VITAMIN B-12 PO) Take 1,000 mcg by mouth daily.   Yes [provider]  Elastic Bandages & Supports (MEDICAL COMPRESSION STOCKINGS) [place] 2 each by Does not apply route daily. 03/09/17  Yes Earlie Server, MD  estradiol (ESTRACE) 1 MG tablet Take 1 mg by mouth daily. 06/16/16  Yes [provider]  gabapentin (NEURONTIN) 100 MG capsule Take 100 mg by mouth 2 (two) times daily.    Yes [provider]  hydroxychloroquine (PLAQUENIL) 200 MG tablet Take 200 mg by mouth 2 (two) times daily.    Yes [provider]  lidocaine-prilocaine (EMLA) cream Apply 1 application topically as needed. 02/16/17  Yes Cammie Sickle, MD  LORazepam (ATIVAN) 0.5 MG tablet Take 0.5 mg every 6 (six) hours as needed by mouth. 01/05/17  Yes [provider]  magnesium chloride (SLOW-MAG) 64 MG TBEC SR tablet Take 1 tablet (64 mg total) by mouth daily. 05/23/17  Yes Earlie Server, MD  metFORMIN (GLUCOPHAGE) 500 MG tablet Take 500 mg by mouth 2 (two) times daily with a meal.   Yes [provider]  metoprolol tartrate (LOPRESSOR) 25 MG tablet Take 1 tablet (25 mg total) by mouth 2 (two) times daily. 04/30/17  Yes Gouru, Illene Silver, MD  ondansetron (ZOFRAN) 8 MG tablet TAKE ONE TABLET EVERY 12 HOURS AS NEEDED 04/06/17  Yes Earlie Server, MD  oxyCODONE (OXY IR/ROXICODONE) 5 MG immediate release tablet Take 1 tablet (5 mg total) by mouth every 6 (six) hours as needed. 01/12/17  Yes Earlie Server, MD  pantoprazole (PROTONIX) 40 MG tablet Take 1 tablet (40 mg total) by mouth daily. 05/23/17  Yes Earlie Server, MD  polyethylene glycol powder (GLYCOLAX/MIRALAX) powder Take 0.5 Containers by mouth.  09/25/16  Yes [provider]  potassium chloride SA (K-DUR,KLOR-CON) 20 MEQ tablet Take 1 tablet (20 mEq total) by mouth daily. 05/23/17  Yes Earlie Server, MD  prochlorperazine (COMPAZINE) 10 MG tablet Take 1 tablet (10 mg total) by mouth every 6 (six) hours as needed. 02/16/17  Yes Cammie Sickle, MD  senna (SENOKOT) 8.6 MG TABS tablet Take 2 tablets (17.2 mg total) by mouth daily. Hold for loose stools. 02/16/17  Yes Charlaine Dalton R, MD  sodium chloride 0.9 % injection Flush each biliary drains (2) with 10 mL two times daily. 03/18/17  Yes [provider]  Sodium Chloride Flush (NORMAL  SALINE FLUSH) 0.9 % SOLN 1 mL by Other route every 8 (eight) hours. Flush each biliary drain with 1 mL twice daily 02/16/17  Yes Cammie Sickle, MD  venlafaxine XR (EFFEXOR-XR) 75 MG 24 hr capsule Take 225 mg by mouth daily.    Yes [provider]  zolpidem (AMBIEN) 10 MG tablet Take 1 tablet (10 mg total) by mouth at bedtime as needed. 02/16/17 06/07/17 Yes Cammie Sickle, MD  predniSONE (DELTASONE) 20 MG tablet Take 1 tablet (20 mg total) by mouth daily with breakfast. Patient not taking: Reported on 06/07/2017 05/30/17   Earlie Server, MD      VITAL SIGNS:  Blood pressure (!) 144/73, pulse 93, temperature (!) 97.4 F (36.3 C), temperature source Axillary, resp. rate 18, height 5' 2"  (1.575 m), weight 67.4 kg (148 lb 11.2 oz), SpO2 100 %.  PHYSICAL EXAMINATION:  Physical Exam  GENERAL:  76 y.o.-year-old patient lying in the bed with no acute distress.  EYES: Pupils equal, round, reactive to light and accommodation. No scleral icterus. Extraocular muscles intact.  HEENT: Head atraumatic, normocephalic. Oropharynx and nasopharynx clear.  NECK:  Supple, no jugular venous distention. No thyroid enlargement, no tenderness.  LUNGS: Normal breath sounds bilaterally, no wheezing, rales,rhonchi or crepitation. No use of accessory muscles of respiration.  CARDIOVASCULAR: S1, S2 normal. No murmurs, rubs, or gallops.  ABDOMEN: Soft, nontender, nondistended. Bowel sounds present. No organomegaly or mass.  EXTREMITIES: No pedal edema, cyanosis, or clubbing.  NEUROLOGIC: Cranial nerves II through XII are intact. Muscle strength 5/5 in all extremities. Sensation intact. Gait not checked.  PSYCHIATRIC: The patient is alert and oriented x 3.  SKIN: No obvious rash, lesion, or ulcer.   LABORATORY PANEL:   CBC Recent Labs  Lab 06/07/17 0820  WBC 10.3  HGB 9.3*  HCT 28.2*  PLT 187    ------------------------------------------------------------------------------------------------------------------  Chemistries  Recent Labs  Lab 06/07/17 0820 06/07/17 1219  NA 135 136  K 3.9 4.1  CL 100* 97*  CO2 26 26  GLUCOSE 209* 107*  BUN 20 19  CREATININE 0.64 0.63  CALCIUM 9.0 9.4  MG 1.5*  --   AST 72* 73*  ALT 75* 81*  ALKPHOS 323* 337*  BILITOT 0.5 0.6   ------------------------------------------------------------------------------------------------------------------  Cardiac Enzymes No results for input(s): TROPONINI in the last 168 hours. ------------------------------------------------------------------------------------------------------------------  RADIOLOGY:  Ct Abdomen Pelvis W Contrast  Result Date: 06/07/2017 CLINICAL DATA:  77 year old female with cholangiocarcinoma. Patient reports that her PPD drain suture came off and has been having bile leak along the surface of the abdomen to lower extremity. Patient also felt right upper quadrant pain last Friday now more diffuse. EXAM: CT ABDOMEN AND PELVIS WITH CONTRAST TECHNIQUE: Multidetector CT imaging of the abdomen and pelvis was performed using the standard protocol following bolus administration of intravenous contrast. CONTRAST:  118m ISOVUE-300 IOPAMIDOL (ISOVUE-300) INJECTION 61% COMPARISON:  None. FINDINGS: Lower chest: Normal heart size. Calcified pleural plaque redemonstrated. Chronic stable interstitial change with centrilobular and paraseptal emphysematous disease in the lower lobes. No effusion or pneumothorax. Hepatobiliary: Atrophic lateral segment of the left hepatic lobe without focal abnormality in the right hepatic lobe. Two percutaneous biliary drains are redemonstrated, one from a ventral approach and the second from a right lateral approach. One of the drains is noted with the tip in the duodenum as before. Previously described hypoenhancing lesion in segment 3 of the liver is stable measuring  2.4 x 1.8 cm versus 2.5 x 2.1 cm previously. Gallbladder is decompressed with gas in the lumen compatible with presence of biliary drains. No evidence of biloma. Pancreas: Normal Spleen: Normal Adrenals/Urinary Tract: Adrenal glands are unremarkable. Kidneys are normal, without renal calculi, focal lesion, or hydronephrosis. Bladder is unremarkable. Stomach/Bowel: Stomach is nondistended. No evidence of gastric outlet obstruction. Normal ligament of Treitz position in small bowel rotation. No small bowel thickening or inflammation. The distal and terminal ileum are unremarkable. Normal appendix. A large volume of retained stool is seen within the colon consistent constipation. Descending and sigmoid diverticulosis without acute diverticulitis. Vascular/Lymphatic: Aortoiliac atherosclerosis without aneurysm. No lymphadenopathy. Reproductive: Hysterectomy. Small stable cyst in the left ovary without suspicious features. This measures 1.6 cm in diameter. Other: No intraperitoneal free fluid. No enhancing fluid collections to suggest the presence of an abscess. Musculoskeletal: No worrisome lytic or sclerotic lesions. IMPRESSION: 1. Stable appearance of the percutaneous biliary drains without evidence of bile  leak or biloma. 2. Increased colonic stool burden consistent constipation. Colonic diverticulosis along the left colon without acute diverticulitis. 3. Stable hypodensity in the left hepatic lobe segment 3 measuring 2.4 x 1.8 cm currently versus 2.5 x 2.1 cm previously. 4. Calcified pleural plaque consistent with prior asbestos exposure. COPD. Electronically Signed   By: Ashley Royalty M.D.   On: 06/07/2017 14:51   IMPRESSION AND PLAN:  80 y f with Cholangiocarcinoma being admitted for bile leakage from drain  * Bile Leak: from side of drain - to be replaced by IR tomorrow  * Hypomagnesemia - replete and recheck  * cholangiocarcinoma - c/s onco  * HTN - continue norvasc and lopressor    All the  records are reviewed and case discussed with ED provider. Management plans discussed with the patient, nursing and they are in agreement.  CODE STATUS: DNR  TOTAL TIME TAKING CARE OF THIS PATIENT: 35 minutes.    Max Sane M.D on 06/07/2017 at 4:33 PM  Between 7am to 6pm - Pager - [phone]  After 6pm go to [email or web address] - Proofreader  [employer] [facility]  Office  [phone]  CC: Primary care physician; Idelle Crouch, MD   Note: This dictation was prepared with Dragon dictation along with smaller phrase technology. Any transcriptional errors that result from this process are unintentional.

## 2017-06-07 NOTE — Progress Notes (Signed)
Inpatient Diabetes Program Recommendations  AACE/ADA: New Consensus Statement on Inpatient Glycemic Control (2015)  Target Ranges:  Prepandial:   less than 140 mg/dL      Peak postprandial:   less than 180 mg/dL (1-2 hours)      Critically ill patients:  140 - 180 mg/dL   Lab Results  Component Value Date   GLUCAP 107 (H) 05/05/2017   HGBA1C 6.3 (H) 11/13/2016    Review of Glycemic Control  Results for Regina Hood, BRODA (MRN [id number]) as of 06/07/2017 14:25  Ref. Range 06/07/2017 08:20 06/07/2017 12:19  Glucose Latest Ref Range: 65 - 99 mg/dL 209 (H) 107 (H)    Diabetes history: Type 2 Outpatient Diabetes medications: Metformin 529m bid Current orders for Inpatient glycemic control: Metformin 5030mbid  * Prednisone daily with breakfast  Inpatient Diabetes Program Recommendations:  Consider ordering CBG tid and hs with Novolog sensitive correction and Novolog 0-5 units qhs  JuGentry FitzRN, BAIllinoisIndianaMHSouth CarolinaCDE Diabetes Coordinator Inpatient Diabetes Program  [phone] Pager) [phone]/19/2019 2:28 PM

## 2017-06-08 ENCOUNTER — Inpatient Hospital Stay: Payer: PPO

## 2017-06-08 ENCOUNTER — Encounter: Payer: Self-pay | Admitting: Interventional Radiology

## 2017-06-08 ENCOUNTER — Other Ambulatory Visit: Payer: Self-pay

## 2017-06-08 DIAGNOSIS — R1084 Generalized abdominal pain: Secondary | ICD-10-CM

## 2017-06-08 DIAGNOSIS — R1011 Right upper quadrant pain: Secondary | ICD-10-CM

## 2017-06-08 DIAGNOSIS — C221 Intrahepatic bile duct carcinoma: Secondary | ICD-10-CM

## 2017-06-08 HISTORY — PX: IR BILIARY DRAIN PLACEMENT WITH CHOLANGIOGRAM: IMG6043

## 2017-06-08 LAB — COMPREHENSIVE METABOLIC PANEL
ALBUMIN: 3 g/dL — AB (ref 3.5–5.0)
ALK PHOS: 285 U/L — AB (ref 38–126)
ALT: 64 U/L — ABNORMAL HIGH (ref 14–54)
ANION GAP: 10 (ref 5–15)
AST: 53 U/L — AB (ref 15–41)
BILIRUBIN TOTAL: 0.6 mg/dL (ref 0.3–1.2)
BUN: 17 mg/dL (ref 6–20)
CO2: 26 mmol/L (ref 22–32)
Calcium: 8.7 mg/dL — ABNORMAL LOW (ref 8.9–10.3)
Chloride: 102 mmol/L (ref 101–111)
Creatinine, Ser: 0.66 mg/dL (ref 0.44–1.00)
GFR calc Af Amer: >60 mL/min (ref >60)
GFR calc non Af Amer: >60 mL/min (ref >60)
Glucose, Bld: 77 mg/dL (ref 65–99)
Potassium: 4.4 mmol/L (ref 3.5–5.1)
SODIUM: 138 mmol/L (ref 135–145)
Total Protein: 6.5 g/dL (ref 6.5–8.1)

## 2017-06-08 LAB — CBC
HEMATOCRIT: 28.1 % — AB (ref 35.0–47.0)
HEMOGLOBIN: 9.1 g/dL — AB (ref 12.0–16.0)
MCH: 29.1 pg (ref 26.0–34.0)
MCHC: 32.2 g/dL (ref 32.0–36.0)
MCV: 90.4 fL (ref 80.0–100.0)
Platelets: 170 10*3/uL (ref 150–440)
RBC: 3.11 MIL/uL — ABNORMAL LOW (ref 3.80–5.20)
RDW: 15.9 % — ABNORMAL HIGH (ref 11.5–14.5)
WBC: 6.1 10*3/uL (ref 3.6–11.0)

## 2017-06-08 LAB — GLUCOSE, CAPILLARY
Glucose-Capillary: 83 mg/dL (ref 65–99)
Glucose-Capillary: 86 mg/dL (ref 65–99)
Glucose-Capillary: 227 mg/dL — ABNORMAL HIGH (ref 65–99)
Glucose-Capillary: 148 mg/dL — ABNORMAL HIGH (ref 65–99)
Glucose-Capillary: 211 mg/dL — ABNORMAL HIGH (ref 65–99)

## 2017-06-08 LAB — CANCER ANTIGEN 19-9: CA 19-9: 61 U/mL — ABNORMAL HIGH (ref 0–35)

## 2017-06-08 MED ORDER — SODIUM CHLORIDE 0.9% FLUSH
5.0000 mL | Freq: Three times a day (TID) | INTRAVENOUS | Status: DC
  Administered 2017-06-08 – 2017-06-09 (×5): 5 mL

## 2017-06-08 MED ORDER — FENTANYL CITRATE (PF) 100 MCG/2ML IJ SOLN
INTRAMUSCULAR | Status: AC | PRN
  Administered 2017-06-08: 25 ug via INTRAVENOUS
  Administered 2017-06-08: 50 ug via INTRAVENOUS

## 2017-06-08 MED ORDER — LIDOCAINE HCL (PF) 1 % IJ SOLN
INTRAMUSCULAR | Status: AC
  Filled 2017-06-08: qty 30

## 2017-06-08 MED ORDER — FENTANYL CITRATE (PF) 100 MCG/2ML IJ SOLN
INTRAMUSCULAR | Status: AC
  Filled 2017-06-08: qty 4

## 2017-06-08 MED ORDER — MORPHINE SULFATE (PF) 4 MG/ML IV SOLN
4.0000 mg | INTRAVENOUS | Status: DC | PRN
  Administered 2017-06-08: 4 mg via INTRAVENOUS
  Filled 2017-06-08: qty 1

## 2017-06-08 MED ORDER — ZOLPIDEM TARTRATE 5 MG PO TABS
5.0000 mg | ORAL_TABLET | Freq: Every evening | ORAL | Status: DC | PRN
  Administered 2017-06-08: 5 mg via ORAL
  Filled 2017-06-08: qty 1

## 2017-06-08 MED ORDER — PREDNISONE 10 MG PO TABS: 5.0000 mg | ORAL_TABLET | Freq: Every day | ORAL | Status: DC

## 2017-06-08 MED ORDER — LIDOCAINE HCL (PF) 1 % IJ SOLN
INTRAMUSCULAR | Status: AC | PRN
  Administered 2017-06-08: 5 mL

## 2017-06-08 MED ORDER — IOPAMIDOL (ISOVUE-300) INJECTION 61%
30.0000 mL | Freq: Once | INTRAVENOUS | Status: AC | PRN
  Administered 2017-06-08: 09:00:00 10 mL

## 2017-06-08 MED ORDER — MIDAZOLAM HCL 5 MG/5ML IJ SOLN
INTRAMUSCULAR | Status: AC | PRN
  Administered 2017-06-08 (×2): 1 mg via INTRAVENOUS

## 2017-06-08 MED ORDER — MIDAZOLAM HCL 5 MG/5ML IJ SOLN
INTRAMUSCULAR | Status: AC
  Filled 2017-06-08: qty 5

## 2017-06-08 NOTE — Progress Notes (Signed)
Pharmacy consult noted "Post IR Procedure Consult Anticoagulant/Antiplatelet PTA/Inpatient Med List Review by Pharmacist".   No PTA anticoagulant or antiplatelet noted.  Gissela Bloch A. [place], [place].D., BCPS Clinical Pharmacist 06/08/17 09:52

## 2017-06-08 NOTE — Progress Notes (Signed)
Patient refuses IV fluids, MD notified, ok to D/C fluids per MD.

## 2017-06-08 NOTE — Progress Notes (Signed)
Patient refuses bed alarms at this time.

## 2017-06-08 NOTE — Progress Notes (Signed)
Blood glucose 86

## 2017-06-08 NOTE — Progress Notes (Signed)
Please note patient is currently followed by outpatient PALLIATIVE. [place] made aware. Flo Shanks RN, BSN, [place] [facility] [place], [facility] [phone]

## 2017-06-08 NOTE — Consult Note (Signed)
Hematology/Oncology Consult note [facility] [phone] [phone]  Patient Care Team: Idelle Crouch, MD as PCP - General (Internal Medicine)   Name of the patient: Regina Hood  [id number]  06-26-1940   Date of visit: 06/08/17 REASON FOR COSULTATION:  Cholangiocarcinoma History of presenting illness- patient is a 77yo female known to me for treatment of Cholangiocarcinoma. She has PBD drains which was recently exchanged in January 2019. Since then patient has had multiple episodes of febrile illness and she was seen and evaluated by [facility] and her PBDs were converted to external drainage. She has also got multiple antibiotics, and blood culture has been negative.  Her chemotherapy has been interrupted since January and she came to my office yesterday for follow up. Patient reports that her PPD drain suture came off and she has since then have bile leak draining along her abdominal skin to lower extremity.  She also started to feel right upper quadrant pain last Friday now her pain has become diffuse in her abdomen.  Denies any fever, chills.  She denies any diarrhea.  She did not notify cancer center until she present for her follow-up today. Also felt a mild nauseated no vomiting.  She was sent to hospital and was admitted for further image work up for abd pain and IR was consulted.  CT showed stable hypodense left hepatic lobe mass. Increased colonic stool burden. PBD drains stable appearence without bile leak or biloma.  Patient was evaluated by Dr.Watts and she had bile drain exchanged this morning, both PBD drain capped.  Patient was seen and evaluated at bedside. Nausea has improved. Bowel movement last night. Feels better. Still have mild epigastric discomfort.  No fever or chills.   Review of systems- Review of Systems  Constitutional: Negative for chills and fever.  HENT: Negative for ear discharge, ear pain and hearing loss.   Eyes:  Negative for photophobia and pain.  Respiratory: Negative for cough and hemoptysis.   Cardiovascular: Negative for chest pain and palpitations.  Gastrointestinal: Negative for abdominal pain, nausea and vomiting.       Epigastric discomfort  Genitourinary: Negative for dysuria.  Musculoskeletal: Negative for back pain and myalgias.  Skin: Negative for rash.  Neurological: Negative for dizziness.  Endo/Heme/Allergies: Negative for environmental allergies. Does not bruise/bleed easily.  Psychiatric/Behavioral: Negative for depression.    Allergies  Allergen Reactions  . Penicillins Nausea Only    Patient Active Problem List   Diagnosis Date Noted  . Hyperbilirubinemia   . Hypomagnesemia   . Hypokalemia 04/27/2017  . Proteinuria 03/31/2017  . COPD exacerbation ([place]) 03/16/2017  . Dehydration 01/19/2017  . History of iron deficiency anemia 12/29/2016  . Systemic lupus erythematosus ([place]) 12/29/2016  . Cholangiocarcinoma ([place]) 12/29/2016  . History of ulcerative colitis 12/29/2016  . Goals of care, counseling/discussion 12/29/2016  . Sepsis ([place]) 11/13/2016  . At risk for sepsis 09/29/2016  . Red blood cell antibody positive, compatible PRBC difficult to obtain 09/22/2016  . Type 2 diabetes mellitus without complication, without long-term current use of insulin ([place]) 09/17/2016  . Liver mass, left lobe 08/23/2016  . Elevated liver enzymes 08/19/2016  . Pruritic rash 08/19/2016  . IDA (iron deficiency anemia) 07/18/2016  . Cellulitis of right upper extremity 07/05/2016  . History of rectal bleeding 07/05/2016  . Rectal bleeding 06/29/2016  . Hypertension 01/07/2016  . Anxiety and depression 09/26/2013  . DDD (degenerative disc disease) 09/26/2013  . Diabetes ([place]) 09/26/2013  . Sarcoidosis 09/26/2013  .  UC (ulcerative colitis) ([place]) 09/26/2013  . Proximal humerus fracture, left, closed, initial encounter 09/17/2011     Past Medical History:  Diagnosis Date  . Arthritis    . Cancer ([place])   . Cellulitis   . Collagen vascular disease ([place])   . COPD (chronic obstructive pulmonary disease) ([place])   . DDD (degenerative disc disease), lumbar   . Depression   . Diabetes mellitus   . Diverticulitis   . GERD (gastroesophageal reflux disease)   . GI bleed   . Headache(784.0)   . Hypertension   . Lupus   . Ulcerative colitis [facility])      Past Surgical History:  Procedure Laterality Date  . ABDOMINAL HYSTERECTOMY    . BREAST BIOPSY Right   . COLONOSCOPY    . COLONOSCOPY WITH PROPOFOL N/A 08/18/2016   Procedure: COLONOSCOPY WITH PROPOFOL;  Surgeon: Manya Silvas, MD;  Location[facility] ENDOSCOPY;  Service: Endoscopy;  Laterality: N/A;  . ESOPHAGOGASTRODUODENOSCOPY (EGD) WITH PROPOFOL N/A 08/18/2016   Procedure: ESOPHAGOGASTRODUODENOSCOPY (EGD) WITH PROPOFOL;  Surgeon: Manya Silvas, MD;  Location: [facility] ENDOSCOPY;  Service: Endoscopy;  Laterality: N/A;  . IR BILIARY DRAIN PLACEMENT WITH CHOLANGIOGRAM  06/08/2017  . ORIF HUMERUS FRACTURE  09/17/2011   Procedure: OPEN REDUCTION INTERNAL FIXATION (ORIF) PROXIMAL HUMERUS FRACTURE;  Surgeon: Augustin Schooling, MD;  Location: [place];  Service: Orthopedics;  Laterality: Left;    Social History   Socioeconomic History  . Marital status: Widowed    Spouse name: Not on file  . Number of children: Not on file  . Years of education: Not on file  . Highest education level: Not on file  Social Needs  . Financial resource strain: Not on file  . Food insecurity - worry: Not on file  . Food insecurity - inability: Not on file  . Transportation needs - medical: Not on file  . Transportation needs - non-medical: Not on file  Occupational History  . Not on file  Tobacco Use  . Smoking status: Former Smoker    Packs/day: 1.00    Years: 10.00    Pack years: 10.00    Types: Cigarettes    Last attempt to quit: 12/30/1998    Years since quitting: 18.4  . Smokeless tobacco: Never Used  . Tobacco comment: quit smoking in 2003    Substance and Sexual Activity  . Alcohol use: No  . Drug use: No  . Sexual activity: Not on file  Other Topics Concern  . Not on file  Social History Narrative  . Not on file     Family History  Problem Relation Age of Onset  . Breast cancer Mother 37  . COPD Father   . Prostate cancer Brother      Current Facility-Administered Medications:  .  0.9 %  sodium chloride infusion, , Intravenous, Continuous, Max Sane, MD, Last Rate: 75 mL/hr at 06/08/17 0431 .  acetaminophen (TYLENOL) tablet 650 mg, 650 mg, Oral, Q6H PRN **OR** acetaminophen (TYLENOL) suppository 650 mg, 650 mg, Rectal, Q6H PRN, Max Sane, MD .  acetaminophen (TYLENOL) tablet 1,000 mg, 1,000 mg, Oral, TID, Max Sane, MD, 1,000 mg at 06/08/17 1004 .  amLODipine (NORVASC) tablet 10 mg, 10 mg, Oral, Daily, Manuella Ghazi, Vipul, MD, 10 mg at 06/08/17 1009 .  estradiol (ESTRACE) tablet 1 mg, 1 mg, Oral, Daily, Manuella Ghazi, Vipul, MD, 1 mg at 06/08/17 1008 .  gabapentin (NEURONTIN) capsule 100 mg, 100 mg, Oral, BID, Max Sane, MD, 100 mg at 06/08/17 1005 .  heparin injection 5,000 Units, 5,000 Units, Subcutaneous, Q8H, Max Sane, MD, 5,000 Units at 06/07/17 2048 .  HYDROcodone-acetaminophen (NORCO/VICODIN) 5-325 MG per tablet 1-2 tablet, 1-2 tablet, Oral, Q4H PRN, Max Sane, MD .  hydroxychloroquine (PLAQUENIL) tablet 200 mg, 200 mg, Oral, BID, Manuella Ghazi, Vipul, MD, 200 mg at 06/08/17 1008 .  insulin aspart (novoLOG) injection 0-5 Units, 0-5 Units, Subcutaneous, QHS, Shah, Vipul, MD .  insulin aspart (novoLOG) injection 0-9 Units, 0-9 Units, Subcutaneous, TID WC, Max Sane, MD, 3 Units at 06/08/17 1235 .  magnesium chloride (SLOW-MAG) 64 MG SR tablet 64 mg, 1 tablet, Oral, Daily, Manuella Ghazi, Vipul, MD, 64 mg at 06/08/17 1008 .  metFORMIN (GLUCOPHAGE) tablet 500 mg, 500 mg, Oral, BID WC, Manuella Ghazi, Vipul, MD, 500 mg at 06/08/17 1008 .  metoprolol tartrate (LOPRESSOR) tablet 25 mg, 25 mg, Oral, BID, Manuella Ghazi, Vipul, MD, 25 mg at 06/08/17 1007 .   morphine 4 MG/ML injection 4 mg, 4 mg, Intravenous, Q4H PRN, Lance Coon, MD, 4 mg at 06/08/17 0142 .  ondansetron (ZOFRAN) tablet 4 mg, 4 mg, Oral, Q6H PRN **OR** ondansetron (ZOFRAN) injection 4 mg, 4 mg, Intravenous, Q6H PRN, Max Sane, MD, 4 mg at 06/08/17 1016 .  pantoprazole (PROTONIX) EC tablet 40 mg, 40 mg, Oral, Daily, Manuella Ghazi, Vipul, MD, 40 mg at 06/08/17 1007 .  potassium chloride SA (K-DUR,KLOR-CON) CR tablet 20 mEq, 20 mEq, Oral, Daily, Manuella Ghazi, Vipul, MD, 20 mEq at 06/08/17 1005 .  predniSONE (DELTASONE) tablet 20 mg, 20 mg, Oral, Q breakfast, Manuella Ghazi, Vipul, MD, 20 mg at 06/08/17 1007 .  senna-docusate (Senokot-S) tablet 2 tablet, 2 tablet, Oral, BID, Max Sane, MD, 2 tablet at 06/08/17 1004 .  sodium chloride flush (NS) 0.9 % injection 5 mL, 5 mL, Intracatheter, Q8H, Sandi Mariscal, MD, 5 mL at 06/08/17 1011 .  traZODone (DESYREL) tablet 25 mg, 25 mg, Oral, QHS PRN, Max Sane, MD, 25 mg at 06/07/17 2218 .  venlafaxine XR (EFFEXOR-XR) 24 hr capsule 225 mg, 225 mg, Oral, Daily, Manuella Ghazi, Vipul, MD, 225 mg at 06/08/17 1004 .  vitamin B-12 (CYANOCOBALAMIN) tablet 1,000 mcg, 1,000 mcg, Oral, Daily, Max Sane, MD, 1,000 mcg at 06/08/17 1007  Facility-Administered Medications Ordered in Other Encounters:  .  0.9 %  sodium chloride infusion, , Intravenous, PRN, Earlie Server, MD, Stopped at 04/27/17 1400 .  heparin lock flush 100 unit/mL, 500 Units, Intravenous, Once, Earlie Server, MD   Physical exam:  Vitals:   06/08/17 0830 06/08/17 0845 06/08/17 0857 06/08/17 0918  BP: (!) 176/86 (!) 180/81 (!) 184/88 (!) 187/77  Pulse: 94 91 91 88  Resp: 15 12 13 18   Temp:    97.8 F (36.6 C)  TempSrc:    Oral  SpO2: 90% 98% 96% 100%  Weight:      Height:       GENERAL:Alert, no distress and comfortable.  EYES: + pallor, no icterus OROPHARYNX: no thrush or ulceration; good dentition  NECK: supple, no masses felt LYMPH:  no palpable lymphadenopathy in the cervical, axillary or inguinal regions LUNGS:  clear to auscultation and  No wheeze or crackles HEART/CVS: regular rate & rhythm and no murmurs; No lower extremity edema ABDOMEN: abdomen soft, non tender. PBD drains capped.  Musculoskeletal:no cyanosis of digits and no clubbing  PSYCH: alert & oriented x 3  NEURO: no focal motor/sensory deficits SKIN:  Erythematous rash on both upper extremities and back, slightly better. Her chronic lupus rash mainly concentrated on her anterior chest which looks stable.  CMP Latest Ref Rng & Units 06/08/2017  Glucose 65 - 99 mg/dL 77  BUN 6 - 20 mg/dL 17  Creatinine 0.44 - 1.00 mg/dL 0.66  Sodium 135 - 145 mmol/L 138  Potassium 3.5 - 5.1 mmol/L 4.4  Chloride 101 - 111 mmol/L 102  CO2 22 - 32 mmol/L 26  Calcium 8.9 - 10.3 mg/dL 8.7(L)  Total Protein 6.5 - 8.1 g/dL 6.5  Total Bilirubin 0.3 - 1.2 mg/dL 0.6  Alkaline Phos 38 - 126 U/L 285(H)  AST 15 - 41 U/L 53(H)  ALT 14 - 54 U/L 64(H)   CBC Latest Ref Rng & Units 06/08/2017  WBC 3.6 - 11.0 K/uL 6.1  Hemoglobin 12.0 - 16.0 g/dL 9.1(L)  Hematocrit 35.0 - 47.0 % 28.1(L)  Platelets 150 - 440 K/uL 170     Ct Abdomen Pelvis W Contrast  Result Date: 06/07/2017 CLINICAL DATA:  77 year old female with cholangiocarcinoma. Patient reports that her PPD drain suture came off and has been having bile leak along the surface of the abdomen to lower extremity. Patient also felt right upper quadrant pain last Friday now more diffuse. EXAM: CT ABDOMEN AND PELVIS WITH CONTRAST TECHNIQUE: Multidetector CT imaging of the abdomen and pelvis was performed using the standard protocol following bolus administration of intravenous contrast. CONTRAST:  1106m ISOVUE-300 IOPAMIDOL (ISOVUE-300) INJECTION 61% COMPARISON:  None. FINDINGS: Lower chest: Normal heart size. Calcified pleural plaque redemonstrated. Chronic stable interstitial change with centrilobular and paraseptal emphysematous disease in the lower lobes. No effusion or pneumothorax. Hepatobiliary:  Atrophic lateral segment of the left hepatic lobe without focal abnormality in the right hepatic lobe. Two percutaneous biliary drains are redemonstrated, one from a ventral approach and the second from a right lateral approach. One of the drains is noted with the tip in the duodenum as before. Previously described hypoenhancing lesion in segment 3 of the liver is stable measuring 2.4 x 1.8 cm versus 2.5 x 2.1 cm previously. Gallbladder is decompressed with gas in the lumen compatible with presence of biliary drains. No evidence of biloma. Pancreas: Normal Spleen: Normal Adrenals/Urinary Tract: Adrenal glands are unremarkable. Kidneys are normal, without renal calculi, focal lesion, or hydronephrosis. Bladder is unremarkable. Stomach/Bowel: Stomach is nondistended. No evidence of gastric outlet obstruction. Normal ligament of Treitz position in small bowel rotation. No small bowel thickening or inflammation. The distal and terminal ileum are unremarkable. Normal appendix. A large volume of retained stool is seen within the colon consistent constipation. Descending and sigmoid diverticulosis without acute diverticulitis. Vascular/Lymphatic: Aortoiliac atherosclerosis without aneurysm. No lymphadenopathy. Reproductive: Hysterectomy. Small stable cyst in the left ovary without suspicious features. This measures 1.6 cm in diameter. Other: No intraperitoneal free fluid. No enhancing fluid collections to suggest the presence of an abscess. Musculoskeletal: No worrisome lytic or sclerotic lesions. IMPRESSION: 1. Stable appearance of the percutaneous biliary drains without evidence of bile leak or biloma. 2. Increased colonic stool burden consistent constipation. Colonic diverticulosis along the left colon without acute diverticulitis. 3. Stable hypodensity in the left hepatic lobe segment 3 measuring 2.4 x 1.8 cm currently versus 2.5 x 2.1 cm previously. 4. Calcified pleural plaque consistent with prior asbestos exposure.  COPD. Electronically Signed   By: DAshley RoyaltyM.D.   On: 06/07/2017 14:51   Ir Biliary Drain Placement With Cholangiogram  Result Date: 06/08/2017 INDICATION: History of cholangiocarcinoma with bilateral internal-external biliary drainage catheters. Patient's right-sided percutaneous biliary drainage catheters been interval early retracted and as such, request made for fluoroscopic guided exchange. Patient is  due for routine fluoroscopic guided exchange and as such, the bilateral percutaneous drainage catheters will be exchanged Additionally, given the chronicity of the patient's bilateral percutaneous drainage catheters, the drains will be upsized from their existing 8 [place] to 10.2 [place]. EXAM: FLUOROSCOPIC GUIDED EXCHANGE OF BILATERAL PERCUTANEOUS BILIARY DRAINAGE CATHETERS COMPARISON:  COMPARISON CT abdomen pelvis - 06/07/2017; 04/29/2017 CONTRAST:  10 cc Isovue 300-administered into the biliary system FLUOROSCOPY TIME:  2 minutes 57 seconds (564 mGy) COMPLICATIONS: None immediate. TECHNIQUE: Informed written consent was obtained from the patient after a discussion of the risks, benefits and alternatives to treatment. Questions regarding the procedure were encouraged and answered. A timeout was performed prior to the initiation of the procedure. The external portion of the existing biliary drainage catheters as well as the surrounding skin were prepped and draped in the usual sterile fashion. A sterile drape was applied covering the operative field. Maximum barrier sterile technique with sterile gowns and gloves were used for the procedure. A timeout was performed prior to the initiation of the procedure. Preprocedural spot fluoroscopic image was obtained of the right upper abdominal quadrant existing bilateral percutaneous biliary drainage catheters. A small amount of contrast was injected via both drainage catheters. Attention was first paid towards exchange and repositioning of the malpositioned  right-sided biliary drainage catheter. The external portion of the biliary drainage catheter was cut and cannulated with a stiff Amplatz wire which was advanced through the drainage catheter to the level of the duodenum. Under intermittent fluoroscopic guidance, the existing biliary drainage catheters exchanged for a new, slightly larger, 10.2 [place] biliary drainage catheter with tip ultimately coiled and locked within the duodenum. Next, the identical procedure was repeated for left hepatic approach biliary drainage catheter again with a new, slightly larger, 10.2 [place] biliary drainage catheter with tip ultimately coiled and locked within the duodenum. Small amount of contrast was injected confirming appropriate position functionality of the new bilateral percutaneous drainage catheters. The catheters were secured to the skin with a single interrupted suture and a StatLock device. Both drainage catheters were capped. Dressings were placed. The patient tolerated the procedure well without immediate postprocedural complication. FINDINGS: Preprocedural spot fluoroscopic image demonstrates retraction of the right hepatic approach biliary drainage catheter with tip coiled at the level the biliary hilum. After successful fluoroscopic guided exchange, repositioning and up sizing, the right and left now 10.2 [place] percutaneous drainage catheters are appropriately positioned with ends coiled and locked within the duodenum. IMPRESSION: Successful fluoroscopic guided exchange and up sizing of bilateral now 10.2 [place] biliary drainage catheters. PLAN: - Both biliary drainage catheters were capped for a trial of internalization. - The patient was given 2 extra gravity bag and instructed to connect the drainage catheter(s) to gravity bags if she were to experience excessive leakage around one or both of the biliary drainage catheters. - The patient will otherwise return for routine fluoroscopic guided exchange in  approximately 8 weeks. Electronically Signed   By: Sandi Mariscal M.D.   On: 06/08/2017 09:30    Assessment and plan- Patient is a 77 y.o. female with cholangiocarcinoma who is currently admitted for bile duct drain dislocation and bile leak, s/p PBD exchange.   # Abdominal pain: can be secondary to constipation. Continue bowel regimen. Now her bile duct drain is capped, may help her constipation.  # PBD drain dislocation: s/p exchange. Repeat LFT in AM.  # Cholangiocarcinoma: outpatient follow up with me next week.  # Hypomagnesia: s/p 2g Magnesium Sulfate yesterday. Continue oral slow  mag. Given that now her drain is capped, hypomagnesia likely to improve.  # Epigastric discomfort: agree with protonix 49m daily.  # I decreased her prednisone to 519mand will continue to taper outpatient.   Code: DNR Dispo: home tomorrow.   Dr.Shah and Dr.Watts, thank you for taking care of this patient. If she continues to be well, she is ok to go home and I will follow up her labs next week.   ZhEarlie ServerMD, PhD Hematology Oncology CoAvera Mckennan Hospitalt AlMer[facility] Hospitalager- [phone]/20/2019

## 2017-06-08 NOTE — Procedures (Signed)
Pre procedural Dx: Cholangiocarcinoma Post procedural Dx: Same  Successful fluroscopic exchange and upsizing of bilateral transhepatic now10 Fr biliary drainage catheters with ends coiled and locked within the duodenum.   Both drains capped.  EBL: None  No immediate post procedural complications.  Ronny Bacon, MD Pager #: [phone]

## 2017-06-08 NOTE — Progress Notes (Signed)
Initial Nutrition Assessment  DOCUMENTATION CODES:   Non-severe (moderate) malnutrition in context of chronic illness  INTERVENTION:  RD recommended patient drink an oral nutrition supplement while here but patient has refused.  Encouraged her to increase her Boost Plus at home to BID since she is still losing weight.  Also discussed adding in a few high-calorie/high-protein snacks between her meals (Greek yogurt, peanut butter crackers) to help meet her calorie/protein needs.  NUTRITION DIAGNOSIS:   Moderate Malnutrition related to chronic illness(stage IV cholangiocarcinoma on chemotherapy) as evidenced by 7.5 percent weight loss over 3 months, mild fat depletion, mild muscle depletion, moderate muscle depletion.  GOAL:   Patient will meet greater than or equal to 90% of their needs  MONITOR:   PO intake, Supplement acceptance, Labs, Weight trends, Skin, I & O's  REASON FOR ASSESSMENT:   Malnutrition Screening Tool    ASSESSMENT:   77 year old female with PMHx of diverticulitis, HTN, GERD, arthritis, COPD, DM type 2, depression, UC, lupus, DDD, stage IV cholangiocarcinoma (chemotherapy on hold since January) who is now admitted with bile duct drain dislocation and bile leak now s/p bilateral PBD exchange and upsizing 2/20.   Met with patient at bedside. She reports she is feeling better now that her drainage tubes have been exchanged. She reports she has a good appetite now and also had a good appetite PTA. She eats 3 meals per day and finishes 100% of meals. For breakfast she usually has eggs, bacon, and grits or a bowl of oatmeal. For lunch she may have a salad or a ham and cheese sandwich. Dinner is usually vegetables. She also drinks one Boost Plus daily at home. She does not like any of the other oral nutrition supplements.   She reports she has been losing weight since she started on treatment last summer. Reports her UBW was around 160 lbs. Patient was 159.2 lbs  03/02/2017 and has lost 12 lbs (7.5% body weight) over 3 months, which is significant for time frame.  Meal Completion: 100% of lunch today per chart  Medications reviewed and include: estradiol 1 mg daily PO, Novolog 0-9 units TID, Novolog 0-5 units QHS, magnesium chloride 1 tablet daily PO, metformin 500 mg BID, Lopressor, pantoprazole, potassium chloride 20 mEq daily PO, Senokot-S 2 tablets BID, vitamin B12 1000 micrograms daily, NS @ 75 mL/hr.  Labs reviewed: CBG 83-227, HgbA1c 6.1.  NUTRITION - FOCUSED PHYSICAL EXAM:    Most Recent Value  Orbital Region  Mild depletion  Upper Arm Region  Moderate depletion  Thoracic and Lumbar Region  Mild depletion  Buccal Region  Mild depletion  Temple Region  Moderate depletion  Clavicle Bone Region  Mild depletion  Clavicle and Acromion Bone Region  Moderate depletion  Scapular Bone Region  Mild depletion  Dorsal Hand  Mild depletion  Patellar Region  Mild depletion  Anterior Thigh Region  Moderate depletion  Posterior Calf Region  Moderate depletion  Edema (RD Assessment)  None  Hair  Reviewed  Eyes  Reviewed  Mouth  Reviewed  Skin  Reviewed  Nails  Reviewed     Diet Order:  Diet regular Room service appropriate? Yes; Fluid consistency: Thin  EDUCATION NEEDS:   Education needs have been addressed  Skin:  Skin Assessment: Reviewed RN Assessment(rash to bilateral arms and back)  Last BM:  06/07/2017  Height:   Ht Readings from Last 1 Encounters:  06/07/17 5' 2"  (1.575 m)    Weight:   Wt Readings from Last  1 Encounters:  06/08/17 147 lb 0.8 oz (66.7 kg)    Ideal Body Weight:  50 kg  BMI:  Body mass index is 26.9 kg/m.  Estimated Nutritional Needs:   Kcal:  5678-8933 (MSJ x 1.3-1.5)  Protein:  80-100 grams (1.2-1.5 grams/kg)  Fluid:  1.6 L/day (25 mL/kg)  Willey Blade, MS, RD, LDN Office: [phone] Pager: [phone] After Hours/Weekend Pager: [phone]

## 2017-06-08 NOTE — Progress Notes (Signed)
[facility] NAME: Annis Lagoy    MR#:  [id number]  DATE OF BIRTH:  01/08/1941  SUBJECTIVE:  CHIEF COMPLAINT:  No chief complaint on file. s/p drain exchange REVIEW OF SYSTEMS:  Review of Systems  Constitutional: Negative for chills, fever and weight loss.  HENT: Negative for nosebleeds and sore throat.   Eyes: Negative for blurred vision.  Respiratory: Negative for cough, shortness of breath and wheezing.   Cardiovascular: Negative for chest pain, orthopnea, leg swelling and PND.  Gastrointestinal: Positive for constipation. Negative for abdominal pain, diarrhea, heartburn, nausea and vomiting.  Genitourinary: Negative for dysuria and urgency.  Musculoskeletal: Negative for back pain.  Skin: Negative for rash.  Neurological: Negative for dizziness, speech change, focal weakness and headaches.  Endo/Heme/Allergies: Does not bruise/bleed easily.  Psychiatric/Behavioral: Negative for depression.   DRUG ALLERGIES:   Allergies  Allergen Reactions  . Penicillins Nausea Only   VITALS:  Blood pressure (!) 187/77, pulse 88, temperature 97.8 F (36.6 C), temperature source Oral, resp. rate 18, height 5' 2"  (1.575 m), weight 66.7 kg (147 lb 0.8 oz), SpO2 100 %. PHYSICAL EXAMINATION:  Physical Exam  Constitutional: She is oriented to person, place, and time and well-developed, well-nourished, and in no distress.  HENT:  Head: Normocephalic and atraumatic.  Eyes: Conjunctivae and EOM are normal. Pupils are equal, round, and reactive to light.  Neck: Normal range of motion. Neck supple. No tracheal deviation present. No thyromegaly present.  Cardiovascular: Normal rate, regular rhythm and normal heart sounds.  Pulmonary/Chest: Effort normal and breath sounds normal. No respiratory distress. She has no wheezes. She exhibits no tenderness.  Abdominal: Soft. Bowel sounds are normal. She exhibits no distension. There is no tenderness.    Musculoskeletal: Normal range of motion.  Neurological: She is alert and oriented to person, place, and time. No cranial nerve deficit.  Skin: Skin is warm and dry. No rash noted.  Psychiatric: Mood and affect normal.   LABORATORY PANEL:  Female CBC Recent Labs  Lab 06/08/17 0504  WBC 6.1  HGB 9.1*  HCT 28.1*  PLT 170   ------------------------------------------------------------------------------------------------------------------ Chemistries  Recent Labs  Lab 06/07/17 0820  06/08/17 0504  NA 135   < > 138  K 3.9   < > 4.4  CL 100*   < > 102  CO2 26   < > 26  GLUCOSE 209*   < > 77  BUN 20   < > 17  CREATININE 0.64   < > 0.66  CALCIUM 9.0   < > 8.7*  MG 1.5*  --   --   AST 72*   < > 53*  ALT 75*   < > 64*  ALKPHOS 323*   < > 285*  BILITOT 0.5   < > 0.6   < > = values in this interval not displayed.   RADIOLOGY:  Ir Biliary Drain Placement With Cholangiogram  Result Date: 06/08/2017 INDICATION: History of cholangiocarcinoma with bilateral internal-external biliary drainage catheters. Patient's right-sided percutaneous biliary drainage catheters been interval early retracted and as such, request made for fluoroscopic guided exchange. Patient is due for routine fluoroscopic guided exchange and as such, the bilateral percutaneous drainage catheters will be exchanged Additionally, given the chronicity of the patient's bilateral percutaneous drainage catheters, the drains will be upsized from their existing 8 [place] to 10.2 [place]. EXAM: FLUOROSCOPIC GUIDED EXCHANGE OF BILATERAL PERCUTANEOUS BILIARY DRAINAGE CATHETERS COMPARISON:  COMPARISON CT abdomen pelvis -  06/07/2017; 04/29/2017 CONTRAST:  10 cc Isovue 300-administered into the biliary system FLUOROSCOPY TIME:  2 minutes 57 seconds (503 mGy) COMPLICATIONS: None immediate. TECHNIQUE: Informed written consent was obtained from the patient after a discussion of the risks, benefits and alternatives to treatment. Questions  regarding the procedure were encouraged and answered. A timeout was performed prior to the initiation of the procedure. The external portion of the existing biliary drainage catheters as well as the surrounding skin were prepped and draped in the usual sterile fashion. A sterile drape was applied covering the operative field. Maximum barrier sterile technique with sterile gowns and gloves were used for the procedure. A timeout was performed prior to the initiation of the procedure. Preprocedural spot fluoroscopic image was obtained of the right upper abdominal quadrant existing bilateral percutaneous biliary drainage catheters. A small amount of contrast was injected via both drainage catheters. Attention was first paid towards exchange and repositioning of the malpositioned right-sided biliary drainage catheter. The external portion of the biliary drainage catheter was cut and cannulated with a stiff Amplatz wire which was advanced through the drainage catheter to the level of the duodenum. Under intermittent fluoroscopic guidance, the existing biliary drainage catheters exchanged for a new, slightly larger, 10.2 [place] biliary drainage catheter with tip ultimately coiled and locked within the duodenum. Next, the identical procedure was repeated for left hepatic approach biliary drainage catheter again with a new, slightly larger, 10.2 [place] biliary drainage catheter with tip ultimately coiled and locked within the duodenum. Small amount of contrast was injected confirming appropriate position functionality of the new bilateral percutaneous drainage catheters. The catheters were secured to the skin with a single interrupted suture and a StatLock device. Both drainage catheters were capped. Dressings were placed. The patient tolerated the procedure well without immediate postprocedural complication. FINDINGS: Preprocedural spot fluoroscopic image demonstrates retraction of the right hepatic approach biliary  drainage catheter with tip coiled at the level the biliary hilum. After successful fluoroscopic guided exchange, repositioning and up sizing, the right and left now 10.2 French percutaneous drainage catheters are appropriately positioned with ends coiled and locked within the duodenum. IMPRESSION: Successful fluoroscopic guided exchange and up sizing of bilateral now 10.2 [place] biliary drainage catheters. PLAN: - Both biliary drainage catheters were capped for a trial of internalization. - The patient was given 2 extra gravity bag and instructed to connect the drainage catheter(s) to gravity bags if she were to experience excessive leakage around one or both of the biliary drainage catheters. - The patient will otherwise return for routine fluoroscopic guided exchange in approximately 8 weeks. Electronically Signed   By: Sandi Mariscal M.D.   On: 06/08/2017 09:30   ASSESSMENT AND PLAN:  51 y f with Cholangiocarcinoma being admitted for bile leakage from drain  * Bile Leak: from side of drain - s/p replacement by IR  * Hypomagnesemia - repleted  * cholangiocarcinoma - outpt onco f/up  * HTN - continue norvasc and lopressor  * Constipation - stool softners     All the records are reviewed and case discussed with Care Management/Social Worker. Management plans discussed with the patient, nursing and they are in agreement.  CODE STATUS: DNR  TOTAL TIME TAKING CARE OF THIS PATIENT: 25 minutes.   More than 50% of the time was spent in counseling/coordination of care: YES  POSSIBLE D/C IN 1 DAYS, DEPENDING ON CLINICAL CONDITION.   Max Sane M.D on 06/08/2017 at 4:26 PM  Between 7am to 6pm - Pager - [phone]  After 6pm go to [email or web address] - Proofreader  [facility]  Office  [phone]  CC: Primary care physician; Idelle Crouch, MD  Note: This dictation was prepared with Dragon dictation along with smaller phrase technology. Any  transcriptional errors that result from this process are unintentional.

## 2017-06-08 NOTE — Progress Notes (Signed)
Patient ID: Regina Hood, female   DOB: 09/14/1940, 77 y.o.   MRN: [id number]        Chief Complaint: Malpositioned biliary drain  Referring Physician(s): Tasia Catchings (Oncology)  Patient Status: [facility] - In-pt  History of Present Illness: Regina Hood is a 77 y.o. female with complex past medical history significant for ulcerative colitis, lupus, diabetes, COPD, collagen vascular disease and cholangiocarcinoma with malignant biliary obstruction post placement of bilateral internal-external biliary drainage catheters at [facility] approximately one year ago.  Patient's bilateral pertinent is bili drainage catheters were most recently exchanged in late November/early December [facility].  [facility] drainage around the right hepatic approach biliary drainage catheter. Bedside examination demonstrates the drainage catheter has been retracted with sideholes now projected external to the skin surface.  As such, request made for fluoroscopic guided bili drainage catheter exchange.  Patient admits to worsening pain associated with the right hepatic approach or drainage catheter. No fever or chills. No chest pain or features of breath beyond her baseline.   Past Medical History:  Diagnosis Date  . Arthritis   . Cancer ([place])   . Cellulitis   . Collagen vascular disease ([place])   . COPD (chronic obstructive pulmonary disease) ([place])   . DDD (degenerative disc disease), lumbar   . Depression   . Diabetes mellitus   . Diverticulitis   . GERD (gastroesophageal reflux disease)   . GI bleed   . Headache(784.0)   . Hypertension   . Lupus   . Ulcerative colitis [facility])     Past Surgical History:  Procedure Laterality Date  . ABDOMINAL HYSTERECTOMY    . BREAST BIOPSY Right   . COLONOSCOPY    . COLONOSCOPY WITH PROPOFOL N/A 08/18/2016   Procedure: COLONOSCOPY WITH PROPOFOL;  Surgeon: Manya Silvas, MD;  Location: [facility] ENDOSCOPY;  Service: Endoscopy;  Laterality: N/A;   . ESOPHAGOGASTRODUODENOSCOPY (EGD) WITH PROPOFOL N/A 08/18/2016   Procedure: ESOPHAGOGASTRODUODENOSCOPY (EGD) WITH PROPOFOL;  Surgeon: Manya Silvas, MD;  Location: [facility] ENDOSCOPY;  Service: Endoscopy;  Laterality: N/A;  . ORIF HUMERUS FRACTURE  09/17/2011   Procedure: OPEN REDUCTION INTERNAL FIXATION (ORIF) PROXIMAL HUMERUS FRACTURE;  Surgeon: Augustin Schooling, MD;  Location: [facility];  Service: Orthopedics;  Laterality: Left;    Allergies: Penicillins  Medications: Prior to Admission medications   Medication Sig Start Date End Date Taking? Authorizing Provider  acetaminophen (TYLENOL) 500 MG tablet Take 1,000 mg by mouth 3 (three) times daily.   Yes [provider]  amLODipine (NORVASC) 10 MG tablet Take 1 tablet (10 mg total) by mouth daily. 04/30/17  Yes Gouru, Illene Silver, MD  calcium carbonate 100 mg/ml SUSP Take by mouth daily.    Yes [provider]  Cyanocobalamin (VITAMIN B-12 PO) Take 1,000 mcg by mouth daily.   Yes [provider]  Elastic Bandages & Supports (MEDICAL COMPRESSION STOCKINGS) Gamaliel 2 each by Does not apply route daily. 03/09/17  Yes Earlie Server, MD  estradiol (ESTRACE) 1 MG tablet Take 1 mg by mouth daily. 06/16/16  Yes [provider]  gabapentin (NEURONTIN) 100 MG capsule Take 100 mg by mouth 2 (two) times daily.    Yes [provider]  hydroxychloroquine (PLAQUENIL) 200 MG tablet Take 200 mg by mouth 2 (two) times daily.    Yes [provider]  lidocaine-prilocaine (EMLA) cream Apply 1 application topically as needed. 02/16/17  Yes Cammie Sickle, MD  LORazepam (ATIVAN) 0.5 MG tablet Take 0.5 mg  every 6 (six) hours as needed by mouth. 01/05/17  Yes [provider]  magnesium chloride (SLOW-MAG) 64 MG TBEC SR tablet Take 1 tablet (64 mg total) by mouth daily. 05/23/17  Yes Earlie Server, MD  metFORMIN (GLUCOPHAGE) 500 MG tablet Take 500 mg by mouth 2 (two) times daily with a meal.   Yes [provider]    metoprolol tartrate (LOPRESSOR) 25 MG tablet Take 1 tablet (25 mg total) by mouth 2 (two) times daily. 04/30/17  Yes Gouru, Illene Silver, MD  ondansetron (ZOFRAN) 8 MG tablet TAKE ONE TABLET EVERY 12 HOURS AS NEEDED 04/06/17  Yes Earlie Server, MD  oxyCODONE (OXY IR/ROXICODONE) 5 MG immediate release tablet Take 1 tablet (5 mg total) by mouth every 6 (six) hours as needed. 01/12/17  Yes Earlie Server, MD  pantoprazole (PROTONIX) 40 MG tablet Take 1 tablet (40 mg total) by mouth daily. 05/23/17  Yes Earlie Server, MD  polyethylene glycol powder (GLYCOLAX/MIRALAX) powder Take 0.5 Containers by mouth.  09/25/16  Yes [provider]  potassium chloride SA (K-DUR,KLOR-CON) 20 MEQ tablet Take 1 tablet (20 mEq total) by mouth daily. 05/23/17  Yes Earlie Server, MD  prochlorperazine (COMPAZINE) 10 MG tablet Take 1 tablet (10 mg total) by mouth every 6 (six) hours as needed. 02/16/17  Yes Cammie Sickle, MD  senna (SENOKOT) 8.6 MG TABS tablet Take 2 tablets (17.2 mg total) by mouth daily. Hold for loose stools. 02/16/17  Yes Charlaine Dalton R, MD  sodium chloride 0.9 % injection Flush each biliary drains (2) with 10 mL two times daily. 03/18/17  Yes [provider]  Sodium Chloride Flush (NORMAL SALINE FLUSH) 0.9 % SOLN 1 mL by Other route every 8 (eight) hours. Flush each biliary drain with 1 mL twice daily 02/16/17  Yes Cammie Sickle, MD  venlafaxine XR (EFFEXOR-XR) 75 MG 24 hr capsule Take 225 mg by mouth daily.    Yes [provider]  zolpidem (AMBIEN) 10 MG tablet Take 1 tablet (10 mg total) by mouth at bedtime as needed. 02/16/17 06/07/17 Yes Cammie Sickle, MD  predniSONE (DELTASONE) 20 MG tablet Take 1 tablet (20 mg total) by mouth daily with breakfast. Patient not taking: Reported on 06/07/2017 05/30/17   Earlie Server, MD     Family History  Problem Relation Age of Onset  . Breast cancer Mother 46  . COPD Father   . Prostate cancer Brother     Social History   Socioeconomic  History  . Marital status: Widowed    Spouse name: None  . Number of children: None  . Years of education: None  . Highest education level: None  Social Needs  . Financial resource strain: None  . Food insecurity - worry: None  . Food insecurity - inability: None  . Transportation needs - medical: None  . Transportation needs - non-medical: None  Occupational History  . None  Tobacco Use  . Smoking status: Former Smoker    Packs/day: 1.00    Years: 10.00    Pack years: 10.00    Types: Cigarettes    Last attempt to quit: 12/30/1998    Years since quitting: 18.4  . Smokeless tobacco: Never Used  . Tobacco comment: quit smoking in 2003  Substance and Sexual Activity  . Alcohol use: No  . Drug use: No  . Sexual activity: None  Other Topics Concern  . None  Social History Narrative  . None    ECOG Status: 1 - Symptomatic  but completely ambulatory  Review of Systems: A 12 point ROS discussed and pertinent positives are indicated in the HPI above.  All other systems are negative.  Review of Systems  Constitutional: Negative for activity change, appetite change and fever.  Respiratory: Negative.   Cardiovascular: Negative.   Gastrointestinal: Positive for abdominal pain.       Worsening right-sided abdominal pain associated with the malpositioned biliary drainage catheter.    Vital Signs: BP (!) 156/81   Pulse 91   Temp 98.4 F (36.9 C) (Oral)   Resp 16   Ht 5' 2"  (1.575 m)   Wt 147 lb 0.8 oz (66.7 kg)   SpO2 91%   BMI 26.90 kg/m   Physical Exam  Constitutional: She appears well-developed and well-nourished.  HENT:  Head: Normocephalic and atraumatic.  Cardiovascular: Regular rhythm.  Tachycardia.  Pulmonary/Chest: Effort normal and breath sounds normal.  Abdominal: Soft. Bowel sounds are normal.  Right-sided biliary drainage catheter has been retracted. Both entrance sites are clean dry with minimal pericatheter erythema.  Psychiatric: She has a normal mood  and affect. Her behavior is normal.  Nursing note and vitals reviewed.   Imaging: Ct Abdomen Pelvis W Contrast  Result Date: 06/07/2017 CLINICAL DATA:  77 year old female with cholangiocarcinoma. Patient reports that her PPD drain suture came off and has been having bile leak along the surface of the abdomen to lower extremity. Patient also felt right upper quadrant pain last Friday now more diffuse. EXAM: CT ABDOMEN AND PELVIS WITH CONTRAST TECHNIQUE: Multidetector CT imaging of the abdomen and pelvis was performed using the standard protocol following bolus administration of intravenous contrast. CONTRAST:  1105m ISOVUE-300 IOPAMIDOL (ISOVUE-300) INJECTION 61% COMPARISON:  None. FINDINGS: Lower chest: Normal heart size. Calcified pleural plaque redemonstrated. Chronic stable interstitial change with centrilobular and paraseptal emphysematous disease in the lower lobes. No effusion or pneumothorax. Hepatobiliary: Atrophic lateral segment of the left hepatic lobe without focal abnormality in the right hepatic lobe. Two percutaneous biliary drains are redemonstrated, one from a ventral approach and the second from a right lateral approach. One of the drains is noted with the tip in the duodenum as before. Previously described hypoenhancing lesion in segment 3 of the liver is stable measuring 2.4 x 1.8 cm versus 2.5 x 2.1 cm previously. Gallbladder is decompressed with gas in the lumen compatible with presence of biliary drains. No evidence of biloma. Pancreas: Normal Spleen: Normal Adrenals/Urinary Tract: Adrenal glands are unremarkable. Kidneys are normal, without renal calculi, focal lesion, or hydronephrosis. Bladder is unremarkable. Stomach/Bowel: Stomach is nondistended. No evidence of gastric outlet obstruction. Normal ligament of Treitz position in small bowel rotation. No small bowel thickening or inflammation. The distal and terminal ileum are unremarkable. Normal appendix. A large volume of retained  stool is seen within the colon consistent constipation. Descending and sigmoid diverticulosis without acute diverticulitis. Vascular/Lymphatic: Aortoiliac atherosclerosis without aneurysm. No lymphadenopathy. Reproductive: Hysterectomy. Small stable cyst in the left ovary without suspicious features. This measures 1.6 cm in diameter. Other: No intraperitoneal free fluid. No enhancing fluid collections to suggest the presence of an abscess. Musculoskeletal: No worrisome lytic or sclerotic lesions. IMPRESSION: 1. Stable appearance of the percutaneous biliary drains without evidence of bile leak or biloma. 2. Increased colonic stool burden consistent constipation. Colonic diverticulosis along the left colon without acute diverticulitis. 3. Stable hypodensity in the left hepatic lobe segment 3 measuring 2.4 x 1.8 cm currently versus 2.5 x 2.1 cm previously. 4. Calcified pleural plaque consistent with prior asbestos exposure.  COPD. Electronically Signed   By: Ashley Royalty M.D.   On: 06/07/2017 14:51    Labs:  CBC: Recent Labs    05/23/17 1232 05/30/17 0938 06/07/17 0820 06/08/17 0504  WBC 5.5 8.7 10.3 6.1  HGB 8.8* 8.8* 9.3* 9.1*  HCT 27.4* 27.1* 28.2* 28.1*  PLT 187 185 187 170    COAGS: Recent Labs    08/06/16 1523 11/13/16 0019 03/16/17 0913 04/30/17 0518  INR 0.86 1.18 0.96 0.91  APTT  --   --   --  43*    BMP: Recent Labs    05/30/17 0938 06/07/17 0820 06/07/17 1219 06/08/17 0504  NA 135 135 136 138  K 3.7 3.9 4.1 4.4  CL 103 100* 97* 102  CO2 28 26 26 26   GLUCOSE 164* 209* 107* 77  BUN 19 20 19 17   CALCIUM 8.7* 9.0 9.4 8.7*  CREATININE 0.74 0.64 0.63 0.66  GFRNONAA >60 >60 >60 >60  GFRAA >60 >60 >60 >60    LIVER FUNCTION TESTS: Recent Labs    05/30/17 0938 06/07/17 0820 06/07/17 1219 06/08/17 0504  BILITOT 0.5 0.5 0.6 0.6  AST 64* 72* 73* 53*  ALT 41 75* 81* 64*  ALKPHOS 277* 323* 337* 285*  PROT 7.0 7.2 7.7 6.5  ALBUMIN 3.0* 3.2* 3.5 3.0*    TUMOR  MARKERS: No results for input(s): AFPTM, CEA, CA199, CHROMGRNA in the last 8760 hours.  Assessment and Plan:  SHERIA ROSELLO is a 77 y.o. female with complex past medical history significant for ulcerative colitis, lupus, diabetes, COPD, collagen vascular disease and cholangiocarcinoma with malignant biliary obstruction post placement of bilateral internal-external biliary drainage catheters at [facility] approximately one year ago.  Patient's bilateral pertinent is bili drainage catheters were most recently exchanged in late November/early December at [facility].  [facility] drainage around the right hepatic approach biliary drainage catheter. Bedside examination demonstrates the drainage catheter has been retracted with sideholes now projected external to the skin surface.  As patient is due for routine bilateral biliary catheter exchange, both drainage catheters will be exchange at this time.  Additionally, given the chronicity of the drainage catheters, the existing 8 French drains will be upsized to 10.2 [place].  Risks and benefits of bilateral biliary drainage catheter exchange and upsizing was discussed with the patient including bleeding, infection, damage to adjacent structures and infection/sepsis.  All of the patient's questions were answered, patient is agreeable to proceed.  Consent signed and in chart.   Thank you for this interesting consult.  I greatly enjoyed meeting MARLINE MORACE and look forward to participating in their care.  A copy of this report was sent to the requesting provider on this date.  Electronically Signed: Sandi Mariscal, MD 06/08/2017, 7:58 AM   I spent a total of 20 Minutes in face to face in clinical consultation, greater than 50% of which was counseling/coordinating care for bilateral biliary drain exchange.

## 2017-06-09 DIAGNOSIS — E440 Moderate protein-calorie malnutrition: Secondary | ICD-10-CM

## 2017-06-09 LAB — HEPATIC FUNCTION PANEL
ALBUMIN: 2.8 g/dL — AB (ref 3.5–5.0)
ALK PHOS: 269 U/L — AB (ref 38–126)
ALT: 58 U/L — ABNORMAL HIGH (ref 14–54)
AST: 52 U/L — ABNORMAL HIGH (ref 15–41)
BILIRUBIN DIRECT: 0.2 mg/dL (ref 0.1–0.5)
BILIRUBIN TOTAL: 0.7 mg/dL (ref 0.3–1.2)
Indirect Bilirubin: 0.5 mg/dL (ref 0.3–0.9)
Total Protein: 6.5 g/dL (ref 6.5–8.1)

## 2017-06-09 LAB — GLUCOSE, CAPILLARY
Glucose-Capillary: 99 mg/dL (ref 65–99)
Glucose-Capillary: 88 mg/dL (ref 65–99)

## 2017-06-09 MED ORDER — HEPARIN SOD (PORK) LOCK FLUSH 100 UNIT/ML IV SOLN
500.0000 [IU] | INTRAVENOUS | Status: AC | PRN
  Administered 2017-06-09: 13:00:00 500 [IU]
  Filled 2017-06-09: qty 5

## 2017-06-09 MED ORDER — SODIUM CHLORIDE 0.9% FLUSH
10.0000 mL | INTRAVENOUS | Status: AC | PRN
  Administered 2017-06-09: 10 mL

## 2017-06-09 NOTE — Progress Notes (Signed)
Pt is being discharged home. Discharge papers given and explained to pt. Pt verbalized understanding. Meds and f/u appointments reviewed with pt. No RX at this time. Educated pt about biliary tube care. Pt verbalized understanding.

## 2017-06-09 NOTE — Discharge Instructions (Signed)

## 2017-06-09 NOTE — Care Management Note (Signed)
Case Management Note  Patient Details  Name: SYEDA PRICKETT MRN: [id number] Date of Birth: 10-30-1940  Subjective/Objective:  Admitted to [facility] with the diagnosis of Cholangiocarcinoma. Lives alone. Son is Elta [place] [phone]). Prescriptions are filled at [facility]. No home Health. No skilled facility. Home oxygen per [employer] for a couple of years. Wears 2 liters continuous. Takes care of all basic activities of daily living herself, drives. Good-Fair appetite. No falls. Son will transport.                   Action/Plan: No follow-up needs identified at this time   Expected Discharge Date:                  Expected Discharge Plan:     In-House Referral:   yes  Discharge planning Services   yes  Post Acute Care Choice:    Choice offered to:     DME Arranged:    DME Agency:     HH Arranged:    [place] Agency:     Status of Service:     If discussed at [employer], dates discussed:    Additional Comments:  Shelbie Ammons, RN MSN CCM Care Management [phone] 06/09/2017, 9:51 AM

## 2017-06-09 NOTE — Care Management Important Message (Signed)
Important Message  Patient Details  Name: NALINI ALCARAZ MRN: [id number] Date of Birth: [birth year]   Medicare Important Message Given:  Yes    Shelbie Ammons, RN 06/09/2017, 7:14 AM

## 2017-06-09 NOTE — Plan of Care (Signed)
  Clinical Measurements: Will remain free from infection 06/09/2017 0204 - Progressing by Scharlene Gloss, RN   Clinical Measurements: Diagnostic test results will improve 06/09/2017 0204 - Progressing by Scharlene Gloss, RN   Education: Knowledge of General Education information will improve 06/09/2017 [phone] - Progressing by Scharlene Gloss, RN   Pain Managment: General experience of comfort will improve 06/09/2017 0204 - Progressing by Scharlene Gloss, RN No complaints of pain this shift.   Safety: Ability to remain free from injury will improve 06/09/2017 0204 - Progressing by Hulbert Branscome, Tommy Rainwater, RN

## 2017-06-11 NOTE — Discharge Summary (Signed)
Regina Hood at [place] NAME: Regina Hood    MR#:  [id number]  DATE OF BIRTH:  Oct 20, 1940  DATE OF ADMISSION:  06/07/2017   ADMITTING PHYSICIAN: Epifanio Lesches, MD  DATE OF DISCHARGE: 06/09/2017  2:00 PM  PRIMARY CARE PHYSICIAN: Idelle Crouch, MD   ADMISSION DIAGNOSIS:  Cholangiocarcinoma non draining biliary tube  DISCHARGE DIAGNOSIS:  Active Problems:   Cholangiocarcinoma (HCC)   Generalized abdominal pain   Malnutrition of moderate degree  SECONDARY DIAGNOSIS:   Past Medical History:  Diagnosis Date  . Arthritis   . Cancer ([place])   . Cellulitis   . Collagen vascular disease ([place])   . COPD (chronic obstructive pulmonary disease) ([place])   . DDD (degenerative disc disease), lumbar   . Depression   . Diabetes mellitus   . Diverticulitis   . GERD (gastroesophageal reflux disease)   . GI bleed   . Headache(784.0)   . Hypertension   . Lupus   . Ulcerative colitis [facility])    HOSPITAL COURSE:  77 y f with Cholangiocarcinomaadmitted for bile leakage from drain  * Bile Leak: from side of drain - s/p replacement by IR and working well.  * Hypomagnesemia - repleted  * cholangiocarcinoma - outpt onco f/up  * HTN - continue norvasc and lopressor  * Constipation - resolved with stool softners DISCHARGE CONDITIONS:  stable CONSULTS OBTAINED:  Treatment Team:  Earlie Server, MD DRUG ALLERGIES:   Allergies  Allergen Reactions  . Penicillins Nausea Only   DISCHARGE MEDICATIONS:   Allergies as of 06/09/2017      Reactions   Penicillins Nausea Only      Medication List    TAKE these medications   acetaminophen 500 MG tablet Commonly known as:  TYLENOL Take 1,000 mg by mouth 3 (three) times daily.   amLODipine 10 MG tablet Commonly known as:  NORVASC Take 1 tablet (10 mg total) by mouth daily.   calcium carbonate 100 mg/ml Susp Take by mouth daily.   estradiol 1 MG tablet Commonly known as:   ESTRACE Take 1 mg by mouth daily.   gabapentin 100 MG capsule Commonly known as:  NEURONTIN Take 100 mg by mouth 2 (two) times daily.   lidocaine-prilocaine cream Commonly known as:  EMLA Apply 1 application topically as needed.   LORazepam 0.5 MG tablet Commonly known as:  ATIVAN Take 0.5 mg every 6 (six) hours as needed by mouth.   magnesium chloride 64 MG Tbec SR tablet Commonly known as:  SLOW-MAG Take 1 tablet (64 mg total) by mouth daily.   Medical Compression Stockings Misc 2 each by Does not apply route daily.   metFORMIN 500 MG tablet Commonly known as:  GLUCOPHAGE Take 500 mg by mouth 2 (two) times daily with a meal.   metoprolol tartrate 25 MG tablet Commonly known as:  LOPRESSOR Take 1 tablet (25 mg total) by mouth 2 (two) times daily.   Normal Saline Flush 0.9 % Soln 1 mL by Other route every 8 (eight) hours. Flush each biliary drain with 1 mL twice daily   ondansetron 8 MG tablet Commonly known as:  ZOFRAN TAKE ONE TABLET EVERY 12 HOURS AS NEEDED   oxyCODONE 5 MG immediate release tablet Commonly known as:  Oxy IR/ROXICODONE Take 1 tablet (5 mg total) by mouth every 6 (six) hours as needed.   pantoprazole 40 MG tablet Commonly known as:  PROTONIX Take 1 tablet (40 mg total) by mouth daily.  PLAQUENIL 200 MG tablet Generic drug:  hydroxychloroquine Take 200 mg by mouth 2 (two) times daily.   polyethylene glycol powder powder Commonly known as:  GLYCOLAX/MIRALAX Take 0.5 Containers by mouth.   potassium chloride SA 20 MEQ tablet Commonly known as:  K-DUR,KLOR-CON Take 1 tablet (20 mEq total) by mouth daily.   predniSONE 20 MG tablet Commonly known as:  DELTASONE Take 1 tablet (20 mg total) by mouth daily with breakfast.   prochlorperazine 10 MG tablet Commonly known as:  COMPAZINE Take 1 tablet (10 mg total) by mouth every 6 (six) hours as needed.   senna 8.6 MG Tabs tablet Commonly known as:  SENOKOT Take 2 tablets (17.2 mg total) by  mouth daily. Hold for loose stools.   sodium chloride 0.9 % injection Flush each biliary drains (2) with 10 mL two times daily.   venlafaxine XR 75 MG 24 hr capsule Commonly known as:  EFFEXOR-XR Take 225 mg by mouth daily.   VITAMIN B-12 PO Take 1,000 mcg by mouth daily.   zolpidem 10 MG tablet Commonly known as:  AMBIEN Take 1 tablet (10 mg total) by mouth at bedtime as needed.        DISCHARGE INSTRUCTIONS:   DIET:  Regular diet DISCHARGE CONDITION:  Good ACTIVITY:  Activity as tolerated OXYGEN:  Home Oxygen: No.  Oxygen Delivery: room air DISCHARGE LOCATION:  home   If you experience worsening of your admission symptoms, develop shortness of breath, life threatening emergency, suicidal or homicidal thoughts you must seek medical attention immediately by calling 911 or calling your MD immediately  if symptoms less severe.  You Must read complete instructions/literature along with all the possible adverse reactions/side effects for all the Medicines you take and that have been prescribed to you. Take any new Medicines after you have completely understood and accpet all the possible adverse reactions/side effects.   Please note  You were cared for by a hospitalist during your hospital stay. If you have any questions about your discharge medications or the care you received while you were in the hospital after you are discharged, you can call the unit and asked to speak with the hospitalist on call if the hospitalist that took care of you is not available. Once you are discharged, your primary care physician will handle any further medical issues. Please note that NO REFILLS for any discharge medications will be authorized once you are discharged, as it is imperative that you return to your primary care physician (or establish a relationship with a primary care physician if you do not have one) for your aftercare needs so that they can reassess your need for medications and  monitor your lab values.    On the day of Discharge:  VITAL SIGNS:  Blood pressure (!) 118/57, pulse 75, temperature 98 F (36.7 C), temperature source Oral, resp. rate 18, height 5' 2"  (1.575 m), weight 67.5 kg (148 lb 13 oz), SpO2 94 %. PHYSICAL EXAMINATION:  GENERAL:  77 y.o.-year-old patient lying in the bed with no acute distress.  EYES: Pupils equal, round, reactive to light and accommodation. No scleral icterus. Extraocular muscles intact.  HEENT: Head atraumatic, normocephalic. Oropharynx and nasopharynx clear.  NECK:  Supple, no jugular venous distention. No thyroid enlargement, no tenderness.  LUNGS: Normal breath sounds bilaterally, no wheezing, rales,rhonchi or crepitation. No use of accessory muscles of respiration.  CARDIOVASCULAR: S1, S2 normal. No murmurs, rubs, or gallops.  ABDOMEN: Soft, non-tender, non-distended. Bowel sounds present. No organomegaly or  mass.  EXTREMITIES: No pedal edema, cyanosis, or clubbing.  NEUROLOGIC: Cranial nerves II through XII are intact. Muscle strength 5/5 in all extremities. Sensation intact. Gait not checked.  PSYCHIATRIC: The patient is alert and oriented x 3.  SKIN: No obvious rash, lesion, or ulcer.  DATA REVIEW:   CBC Recent Labs  Lab 06/08/17 0504  WBC 6.1  HGB 9.1*  HCT 28.1*  PLT 170    Chemistries  Recent Labs  Lab 06/07/17 0820  06/08/17 0504 06/09/17 0519  NA 135   < > 138  --   K 3.9   < > 4.4  --   CL 100*   < > 102  --   CO2 26   < > 26  --   GLUCOSE 209*   < > 77  --   BUN 20   < > 17  --   CREATININE 0.64   < > 0.66  --   CALCIUM 9.0   < > 8.7*  --   MG 1.5*  --   --   --   AST 72*   < > 53* 52*  ALT 75*   < > 64* 58*  ALKPHOS 323*   < > 285* 269*  BILITOT 0.5   < > 0.6 0.7   < > = values in this interval not displayed.     Follow-up Information    Idelle Crouch, MD. Go on 06/17/2017.   Specialty:  Internal Medicine Why:  at 3:15 p.m. Contact information: [address] [address] [phone]        Earlie Server, MD. Schedule an appointment as soon as possible for a visit in 2 week(s).   Specialty:  Oncology Contact information: [place] [address] [phone]           Management plans discussed with the patient, family and they are in agreement.  CODE STATUS: Prior   TOTAL TIME TAKING CARE OF THIS PATIENT: 45 minutes.    Max Sane M.D on 06/11/2017 at 3:32 PM  Between 7am to 6pm - Pager - [phone]  After 6pm go to [email or web address] - Proofreader  [facility]  Office  [phone]  CC: Primary care physician; Idelle Crouch, MD   Note: This dictation was prepared with Dragon dictation along with smaller phrase technology. Any transcriptional errors that result from this process are unintentional.

## 2017-06-14 ENCOUNTER — Inpatient Hospital Stay: Payer: PPO

## 2017-06-14 ENCOUNTER — Inpatient Hospital Stay (HOSPITAL_BASED_OUTPATIENT_CLINIC_OR_DEPARTMENT_OTHER): Payer: PPO | Admitting: Oncology

## 2017-06-14 ENCOUNTER — Encounter: Payer: Self-pay | Admitting: Oncology

## 2017-06-14 VITALS — BP 121/76 | HR 91 | Temp 97.3°F | Resp 18 | Ht 62.0 in | Wt 151.5 lb

## 2017-06-14 DIAGNOSIS — C221 Intrahepatic bile duct carcinoma: Secondary | ICD-10-CM | POA: Diagnosis not present

## 2017-06-14 DIAGNOSIS — R109 Unspecified abdominal pain: Secondary | ICD-10-CM

## 2017-06-14 DIAGNOSIS — R1011 Right upper quadrant pain: Secondary | ICD-10-CM | POA: Diagnosis not present

## 2017-06-14 DIAGNOSIS — E876 Hypokalemia: Secondary | ICD-10-CM

## 2017-06-14 DIAGNOSIS — E860 Dehydration: Secondary | ICD-10-CM

## 2017-06-14 DIAGNOSIS — R21 Rash and other nonspecific skin eruption: Secondary | ICD-10-CM | POA: Diagnosis not present

## 2017-06-14 DIAGNOSIS — R531 Weakness: Secondary | ICD-10-CM | POA: Diagnosis not present

## 2017-06-14 DIAGNOSIS — D6481 Anemia due to antineoplastic chemotherapy: Secondary | ICD-10-CM | POA: Diagnosis not present

## 2017-06-14 DIAGNOSIS — M329 Systemic lupus erythematosus, unspecified: Secondary | ICD-10-CM | POA: Diagnosis not present

## 2017-06-14 LAB — CBC WITH DIFFERENTIAL/PLATELET
Basophils Absolute: 0 10*3/uL (ref 0–0.1)
Basophils Relative: 1 %
Eosinophils Absolute: 0.3 10*3/uL (ref 0–0.7)
Eosinophils Relative: 4 %
HEMATOCRIT: 26.9 % — AB (ref 35.0–47.0)
HEMOGLOBIN: 8.7 g/dL — AB (ref 12.0–16.0)
LYMPHS ABS: 0.6 10*3/uL — AB (ref 1.0–3.6)
Lymphocytes Relative: 8 %
MCH: 29.5 pg (ref 26.0–34.0)
MCHC: 32.4 g/dL (ref 32.0–36.0)
MCV: 91.1 fL (ref 80.0–100.0)
MONO ABS: 1.1 10*3/uL — AB (ref 0.2–0.9)
Monocytes Relative: 15 %
NEUTROS ABS: 5.6 10*3/uL (ref 1.4–6.5)
NEUTROS PCT: 72 %
Platelets: 202 10*3/uL (ref 150–440)
RBC: 2.95 MIL/uL — ABNORMAL LOW (ref 3.80–5.20)
RDW: 16 % — ABNORMAL HIGH (ref 11.5–14.5)
WBC: 7.7 10*3/uL (ref 3.6–11.0)

## 2017-06-14 LAB — COMPREHENSIVE METABOLIC PANEL
ALBUMIN: 3.1 g/dL — AB (ref 3.5–5.0)
ALK PHOS: 344 U/L — AB (ref 38–126)
ALT: 45 U/L (ref 14–54)
ANION GAP: 7 (ref 5–15)
AST: 54 U/L — ABNORMAL HIGH (ref 15–41)
BILIRUBIN TOTAL: 0.5 mg/dL (ref 0.3–1.2)
BUN: 13 mg/dL (ref 6–20)
CALCIUM: 8.7 mg/dL — AB (ref 8.9–10.3)
CO2: 25 mmol/L (ref 22–32)
Chloride: 101 mmol/L (ref 101–111)
Creatinine, Ser: 0.64 mg/dL (ref 0.44–1.00)
GLUCOSE: 153 mg/dL — AB (ref 65–99)
Potassium: 3.9 mmol/L (ref 3.5–5.1)
Sodium: 133 mmol/L — ABNORMAL LOW (ref 135–145)
TOTAL PROTEIN: 7.2 g/dL (ref 6.5–8.1)

## 2017-06-14 LAB — MAGNESIUM: MAGNESIUM: 1.4 mg/dL — AB (ref 1.7–2.4)

## 2017-06-14 MED ORDER — SODIUM CHLORIDE 0.9 % IV SOLN: 2.0000 g | Freq: Once | INTRAVENOUS | Status: DC

## 2017-06-14 MED ORDER — SODIUM CHLORIDE 0.9% FLUSH
10.0000 mL | Freq: Once | INTRAVENOUS | Status: AC
  Administered 2017-06-14: 10 mL via INTRAVENOUS
  Filled 2017-06-14: qty 10

## 2017-06-14 MED ORDER — HEPARIN SOD (PORK) LOCK FLUSH 100 UNIT/ML IV SOLN
500.0000 [IU] | Freq: Once | INTRAVENOUS | Status: AC
  Administered 2017-06-14: 500 [IU] via INTRAVENOUS
  Filled 2017-06-14: qty 5

## 2017-06-14 MED ORDER — POTASSIUM CHLORIDE CRYS ER 20 MEQ PO TBCR: 20.0000 meq | EXTENDED_RELEASE_TABLET | Freq: Every day | ORAL | 0 refills | Status: DC

## 2017-06-14 MED ORDER — PREDNISONE 1 MG PO TABS: 5.0000 mg | ORAL_TABLET | Freq: Every day | ORAL | 0 refills | Status: DC

## 2017-06-14 MED ORDER — HEPARIN SOD (PORK) LOCK FLUSH 100 UNIT/ML IV SOLN
INTRAVENOUS | Status: AC
  Filled 2017-06-14: qty 5

## 2017-06-14 MED ORDER — SODIUM CHLORIDE 0.9 % IV SOLN
INTRAVENOUS | Status: DC
  Administered 2017-06-14: 11:00:00 via INTRAVENOUS
  Filled 2017-06-14: qty 1000

## 2017-06-14 MED ORDER — MAGNESIUM SULFATE 2 GM/50ML IV SOLN
2.0000 g | Freq: Once | INTRAVENOUS | Status: AC
  Administered 2017-06-14: 2 g via INTRAVENOUS
  Filled 2017-06-14: qty 50

## 2017-06-14 NOTE — Progress Notes (Signed)
Hematology/Oncology Follow up note [facility] [phone] [phone]  Patient Care Team: Idelle Crouch, MD as PCP - General (Internal Medicine) REASON FOR VISIT Follow up for treatment of cholangiocarcinoma  PERTINENT ONCOLOGY HISTORY Regina Hood 77 y.o.  female with PMH listed as below, most significantly for cholangiocarcinoma who has been on chemotherapy treatment presents to transfer her cancer care to our cancer center.  After extensive medical record review, summary of her diagnosis and treatment history are listed below.  1. Initially presented in 07/2016 to an outside ED with lower and upper GI bleed.  A. She was found to have new LFTs elevations including AST 340, ALT 379, Alk phos 420 and t bili 0.9.   B. [place] was obtained, which did not visualize the left lobe.  C. She was evaluated by gastroenterology because of her history of UC and underwent colonoscopy, which was unremarkable per path report.  2. 08/20/16, CT noted diffuse atrophy of the left lobe with left intrahepatic biliary ductal dilatation, with 4.1 cm ill-defined low-attenuation mass. This mass was suspicious for malignancy.  3. 08/2016, MRI/MRCP which showed abnormal left hepatic lobe with diffuse biliary dilatation with a shrunken left hepatic lobe and a 2.42.9 cm oval-shaped masslike component displacing the adjacent bile ducts. There is an approximately 1.6 cm segment truncated bile ducts involving common hepatic duct and right and left hepatic although no dilatation of the right hepatic duct system was seen.   4. 09/02/16, ERCP showed localized biliary stricture in the left intrahepatic duct with dilation of the left intrahepatic branches. Sphincterotomy was performed and a stent was placed. EUS with a 3.3 cm mass and left duct dilation.  5. Repeat ERCP was done 09/10/16, during which the stent was exchanged.  6. 09/20/16, taken to OR and noted to a have an peritoneal  implant with biopsy c/w adenocarcinoma. The neoplastic cells are positive for CK7. They are negative for CK20, CDX-2, TTF-1, Napsin A, hep-par1, glypican 3, and arginase. The immunohistochemical features are consistent with adenocarcinoma of upper gastrointestinal tract or pancreaticobiliary origin.  7. 09/28/16, initially seen in medical oncology clinic by Dr. Filomena Jungling 8. 09/29/16, admitted for biliary obstruction and cholangitis, s/p PBD placement, d/c 10/06/16 9. 10/12/16, initiated on gemcitabine (400 mg/m2). CA 19-9 167 10. 10/26/16, increased gemcitabine (800 mg/m2) 11. 11/23/2016 Cycle 3 Gemzar (872m/m2), 12/07/2016, Gemzar (8015mm2) added cisplatin 2564m2  12. 12/14/2016, CT CAP shows slight interval decrease in size of L hepatic lobe cholangio. Slight interval increase in size of porta hepatis lymph node whiel other nodes are slightly smaller. Multiple bilateral thyroid nodules. Stable mediastinal lymph nodes. CA 19-9 60 14  8/30 Cycle 4 Gemzar (800m48m) added cisplatin 20mg67m 15, 12/28/2016 Gemcitabine 800mg/42mcisplatin was hold due to anemia. S/p 1 PRBC transfusion.   [facility]   [facility] [place]    She forgot her appointment on day 8 and Gemcitabine 800mg/m60md cisplatin 20mg/m240m given  Day 9 of cycle 4 (01/05/2017).        01/19/2017  Cycle 5 Day 1Gemcitabine 800mg/m2,58mplatin 20mg/m2, 62munit blood transfusion.        01/26/2017 Cycle 5 Day 8 Gemcitabine 800mg/m2, c60matin 20mg/m2    60mShe can not tolerate Gemcitabine and Cisplatin due to cytopenia and profound weakness. She was continued on Gemcitabine        single agent. Last chemotherapy was 12/19.  16 She has been chronically on dexamethasone since she started her treatment in [place]. She was originally on 4 mg of dexamethasone when she transition her care to me. I have weaned down to 1 mg, however her skin rash got worse. After recent hospitalization at [facility], the 55m Dexamethasone was  discontinued after discharge. Due to profound weakness and the abrupt stop of Dexamethasone, I start patient on 568mprednisone on 05/16/2017.    17 [facility] She spiked fever one week later and had hyperbilirubinemia and was treated for possible infection. Her blood culture was negative for multiple times.     06 May 2017, Patient's PPD has been converted to externally drained [facility] not enough drainage and also abdominal pain.  She called [facility] and was instructed to have labs done locally and if no rising of bilirubin level, no intervention was needed.  At my office, she has had labs done which reviewed normal bilirubin level.  She appears dehydrated and she received a hydration session at out infusion center. .HMarland Kitchenr biliary drains have about 10-20 cc/day output, draining well since she was instructed to flush it.     INTERVAL HISTORY Regina ANSELMO89.o.  female with cholangiocarcinoma follows up after hospital discharge. S/p PBD exchange.  Patient reports feeling tired. occasionally she has epigastric pain. She has daily bowel movements, sometimes 2-3 times, usually small hard stool balls. She takes Senna 2 tablets daily.    Review of Systems  Constitutional: Positive for fatigue. Negative for appetite change, chills and diaphoresis.  HENT:   Negative for hearing loss, nosebleeds and sore throat.   Eyes: Negative for eye problems and icterus.  Respiratory: Negative for cough, hemoptysis and shortness of breath.        Use oxygen as needed at home.  Cardiovascular: Negative for chest pain and leg swelling.  Gastrointestinal: Negative for abdominal pain, blood in stool, constipation and diarrhea.  Endocrine: Negative for hot flashes.  Genitourinary: Negative for difficulty urinating, dyspareunia, dysuria and nocturia.   Musculoskeletal: Negative  for arthralgias, back pain, flank pain and myalgias.  Skin: Positive for rash. Negative for itching.        rash on bilateral upper extremities improved slightly.  Neurological: Negative for dizziness, headaches, numbness and seizures.  Hematological: Negative for adenopathy.  Psychiatric/Behavioral: Negative for confusion and depression. The patient is not nervous/anxious.    MEDICAL HISTORY:  Past Medical History:  Diagnosis Date  . Arthritis   . Cancer ([place]  . Cellulitis   . Collagen vascular disease ([place]  . COPD (chronic obstructive pulmonary disease) ([facility]  . DDD (degenerative disc disease), lumbar   . Depression   . Diabetes mellitus   . Diverticulitis   . GERD (gastroesophageal reflux disease)   . GI bleed   . Headache(784.0)   . Hypertension   . Lupus   . Ulcerative colitis ([place]    SURGICAL HISTORY: Past Surgical History:  Procedure Laterality Date  . ABDOMINAL HYSTERECTOMY    . BREAST BIOPSY Right   . COLONOSCOPY    . COLONOSCOPY WITH PROPOFOL N/A 08/18/2016   Procedure: COLONOSCOPY WITH PROPOFOL;  Surgeon: RoManya SilvasMD;  [facility];  Service: Endoscopy;  Laterality: N/A;  . ESOPHAGOGASTRODUODENOSCOPY (EGD) WITH PROPOFOL N/A 08/18/2016   Procedure: ESOPHAGOGASTRODUODENOSCOPY (EGD) WITH PROPOFOL;  Surgeon: Manya Silvas, MD;  Location: [facility] ENDOSCOPY;  Service: Endoscopy;  Laterality: N/A;  . IR BILIARY DRAIN PLACEMENT WITH CHOLANGIOGRAM  06/08/2017  . ORIF HUMERUS FRACTURE  09/17/2011   Procedure: OPEN REDUCTION INTERNAL FIXATION (ORIF) PROXIMAL HUMERUS FRACTURE;  Surgeon: Augustin Schooling, MD;  Location: [place];  Service: Orthopedics;  Laterality: Left;    SOCIAL HISTORY: Social History   Socioeconomic History  . Marital status: Widowed    Spouse name: Not on file  . Number of children: Not on file  . Years of education: Not on file  . Highest education level: Not on file  Social Needs  . Financial resource strain: Not on file  .  Food insecurity - worry: Not on file  . Food insecurity - inability: Not on file  . Transportation needs - medical: Not on file  . Transportation needs - non-medical: Not on file  Occupational History  . Not on file  Tobacco Use  . Smoking status: Former Smoker    Packs/day: 1.00    Years: 10.00    Pack years: 10.00    Types: Cigarettes    Last attempt to quit: 12/30/1998    Years since quitting: 18.4  . Smokeless tobacco: Never Used  . Tobacco comment: quit smoking in 2003  Substance and Sexual Activity  . Alcohol use: No  . Drug use: No  . Sexual activity: Not on file  Other Topics Concern  . Not on file  Social History Narrative  . Not on file    FAMILY HISTORY: Family History  Problem Relation Age of Onset  . Breast cancer Mother 10  . COPD Father   . Prostate cancer Brother     ALLERGIES:  is allergic to penicillins.  MEDICATIONS:  Current Outpatient Medications  Medication Sig Dispense Refill  . acetaminophen (TYLENOL) 500 MG tablet Take 1,000 mg by mouth 3 (three) times daily.    Marland Kitchen amLODipine (NORVASC) 10 MG tablet Take 1 tablet (10 mg total) by mouth daily. 30 tablet 0  . calcium carbonate 100 mg/ml SUSP Take by mouth daily.     . Cyanocobalamin (VITAMIN B-12 PO) Take 1,000 mcg by mouth daily.    Marland Kitchen estradiol (ESTRACE) 1 MG tablet Take 1 mg by mouth daily.    Marland Kitchen gabapentin (NEURONTIN) 100 MG capsule Take 100 mg by mouth 2 (two) times daily.     . hydroxychloroquine (PLAQUENIL) 200 MG tablet Take 200 mg by mouth 2 (two) times daily.     Marland Kitchen lidocaine-prilocaine (EMLA) cream Apply 1 application topically as needed. 30 g 3  . LORazepam (ATIVAN) 0.5 MG tablet Take 0.5 mg every 6 (six) hours as needed by mouth.  0  . magnesium chloride (SLOW-MAG) 64 MG TBEC SR tablet Take 1 tablet (64 mg total) by mouth daily. 30 tablet 1  . metFORMIN (GLUCOPHAGE) 500 MG tablet Take 500 mg by mouth 2 (two) times daily with a meal.    . metoprolol tartrate (LOPRESSOR) 25 MG tablet Take  1 tablet (25 mg total) by mouth 2 (two) times daily. 60 tablet 0  . ondansetron (ZOFRAN) 8 MG tablet TAKE ONE TABLET EVERY 12 HOURS AS NEEDED 30 tablet 3  . oxyCODONE (OXY IR/ROXICODONE) 5 MG immediate release tablet Take 1 tablet (5 mg total) by mouth every 6 (six) hours as needed. 120 tablet 0  . pantoprazole (PROTONIX) 40 MG tablet Take 1 tablet (40 mg total) by mouth daily. 30 tablet 3  . potassium chloride SA (  K-DUR,KLOR-CON) 20 MEQ tablet Take 1 tablet (20 mEq total) by mouth daily. 30 tablet 0  . prochlorperazine (COMPAZINE) 10 MG tablet Take 1 tablet (10 mg total) by mouth every 6 (six) hours as needed. 30 tablet 3  . senna (SENOKOT) 8.6 MG TABS tablet Take 2 tablets (17.2 mg total) by mouth daily. Hold for loose stools. 120 each 3  . venlafaxine XR (EFFEXOR-XR) 75 MG 24 hr capsule Take 225 mg by mouth daily.     Regino Schultze Bandages & Supports (MEDICAL COMPRESSION STOCKINGS) MISC 2 each by Does not apply route daily. (Patient not taking: Reported on 06/14/2017) 2 each 1  . predniSONE (DELTASONE) 1 MG tablet Take 5 tablets (5 mg total) by mouth daily with breakfast. 90 tablet 0  . sodium chloride 0.9 % injection Flush each biliary drains (2) with 10 mL two times daily.    . Sodium Chloride Flush (NORMAL SALINE FLUSH) 0.9 % SOLN 1 mL by Other route every 8 (eight) hours. Flush each biliary drain with 1 mL twice daily (Patient not taking: Reported on 06/14/2017) 84 Syringe 3  . zolpidem (AMBIEN) 10 MG tablet Take 1 tablet (10 mg total) by mouth at bedtime as needed. 30 tablet 3   No current facility-administered medications for this visit.    Facility-Administered Medications Ordered in Other Visits  Medication Dose Route Frequency Provider Last Rate Last Dose  . 0.9 %  sodium chloride infusion   Intravenous PRN Earlie Server, MD   Stopped at 04/27/17 1400  . heparin lock flush 100 unit/mL  500 Units Intravenous Once Earlie Server, MD          .  PHYSICAL EXAMINATION: ECOG PERFORMANCE STATUS: 2 -  Symptomatic, <50% confined to bed Vitals:   06/14/17 1026  BP: 121/76  Pulse: 91  Resp: 18  Temp: (!) 97.3 F (36.3 C)  SpO2: 97%   Filed Weights   06/14/17 1026  Weight: 151 lb 8 oz (68.7 kg)   Physical Exam  Constitutional: She is oriented to person, place, and time and well-developed, well-nourished, and in no distress. No distress.  HENT:  Head: Normocephalic and atraumatic.  Mouth/Throat: Oropharynx is clear and moist. No oropharyngeal exudate.  Eyes: EOM are normal. Pupils are equal, round, and reactive to light. No scleral icterus.  Neck: Normal range of motion. Neck supple. No JVD present.  Cardiovascular: Regular rhythm and normal heart sounds.  Pulmonary/Chest: Effort normal and breath sounds normal. No respiratory distress. She has no wheezes. She has no rales.  Abdominal: Soft. Bowel sounds are normal. She exhibits no distension. There is tenderness.  Right PBD drain suture came off and appears dislocated.  Bile leak,   Musculoskeletal: Normal range of motion. She exhibits no edema or deformity.  trace edema.  Lymphadenopathy:    She has no cervical adenopathy.  Neurological: She is oriented to person, place, and time. No cranial nerve deficit.  Skin: Skin is dry. She is not diaphoretic. There is erythema.  Erythematous rash on both upper extremities and back, slightly better. Her chronic lupus rash mainly concentrated on her anterior chest which looks stable.   Psychiatric: Affect and judgment normal.      LABORATORY DATA:  I have reviewed the data as listed Lab Results  Component Value Date   WBC 7.7 06/14/2017   HGB 8.7 (L) 06/14/2017   HCT 26.9 (L) 06/14/2017   MCV 91.1 06/14/2017   PLT 202 06/14/2017   Recent Labs    04/29/17 0320  06/07/17 1219 06/08/17 0504 06/09/17 0519 06/14/17 1001  NA 138   < > 136 138  --  133*  K 3.4*   < > 4.1 4.4  --  3.9  CL 101   < > 97* 102  --  101  CO2 28   < > 26 26  --  25  GLUCOSE 87   < > 107* 77  --   153*  BUN 13   < > 19 17  --  13  CREATININE 0.62   < > 0.63 0.66  --  0.64  CALCIUM 8.3*   < > 9.4 8.7*  --  8.7*  GFRNONAA >60   < > >60 >60  --  >60  GFRAA >60   < > >60 >60  --  >60  PROT 6.7   < > 7.7 6.5 6.5 7.2  ALBUMIN 2.9*   < > 3.5 3.0* 2.8* 3.1*  AST 63*   < > 73* 53* 52* 54*  ALT 51   < > 81* 64* 58* 45  ALKPHOS 297*   < > 337* 285* 269* 344*  BILITOT 3.1*   < > 0.6 0.6 0.7 0.5  BILIDIR 1.9*  --   --   --  0.2  --   IBILI 1.2*  --   --   --  0.5  --    < > = values in this interval not displayed.   [place] CA 19-9   09/14/16 220 --> 09/28/16 176--> 10/12/16 167-->12/07/2016 64-->12/14/2016 60. --> 12/29/2016 87--> 01/26/2017 99-->03/02/2017 53-->03/23/2017 72   RADIOGRAPHIC STUDIES: I have personally reviewed the radiological images as listed and agreed with the findings in the report. CT 12/14/2016 [facility] Impression: 1. Slight interval decrease in size of the left hepatic lobe cholangiocarcinoma. Slight interval decrease in size of the hypoattenuating left hepatic lobe lesion which now measures 3.6 x 2.8 cm, previously 3.9 x 2.9 cm 2. Slight interval increase in size of a porta hepatis lymph node. Other nodes are slightly smaller. 3. Multiple bilateral thyroid nodules. Recommend ultrasound for further evaluation. 4. Stable mediastinal lymph nodes.  CT chest abdomen pelvis 02/24/2017 comparing to 08/25/16 Study in [facility]  1. No acute findings within the chest, abdomen or pelvis. 2. There has been interval decrease in size of mass associated with left lobe of liver. No specific findings identified to suggest metastatic disease.Mass within the anterior and central aspect of the left lobe appears decreased in size from previous exam measuring 3.0 x 3.2 cm, 3. Bilateral percutaneous biliary drainage catheters are in place without significant biliary ductal dilatation. 4. Bilateral pleural calcifications and chronic interstitial changes of pulmonary  fibrosis. Prominent mediastinal lymph nodes are nonspecific in the setting of interstitial lung disease.  CT abdomen pelvis w contrast 04/18/2017 comparing to 11.28/2018 study 2.8 x 2.5 cm hypoenhancing mass in segment 3 of the liver, mildly decreased. Associated atrophy of the lateral segment left hepatic lobe. Two indwelling biliary stent, unchanged. Small nodes in the porta hepatis measuring up to 11 mm short axis, stable versus mildly decreased. No evidence of new/progressive metastatic disease.  CT abdomen pelvis w contrast 04/29/2017 comparing to 04/18/2017 exam.  Stable exam.  No new or progressive findings. 2.5 x 2.1 cm liver lesion, stable appearance of 2 bilary drains.    ASSESSMENT & PLAN:  Cancer Staging Cholangiocarcinoma [facility]) Staging form: Intrahepatic Bile Duct, AJCC 8th Edition - Clinical stage from 12/29/2016: Stage IV (cT1, cNX,  pM1) - Signed by Earlie Server, MD on 12/29/2016  1. Cholangiocarcinoma ([place])   2. Hypomagnesemia   3. Generalized weakness    # Cholangiocarcinoma: stable on CT scan. Will hold chemotherapy, plan resuming next week.  # Abdominal Pain/PBD dislocation: s/p PBD exchange. Bilirubin stable.  # Skin rash: no dramatic improvement while on steroids.  # Fatigue and weakness: Her steroids have been discontinued and she feels tried. Will resume her on 17m prednisone.  #  Continue Protonix 437mdaily.  # hypomagnesia, plan Magnesium sulfate IV 2g x 1 today at infusion center.  Continue oral KCL 2042mdaily and Slow Mag 16m61mily.    Return of visit: 1 week  ZhouEarlie Server, PhD Hematology Oncology [facility]- [phone]

## 2017-06-14 NOTE — Progress Notes (Signed)
No new changes noted today 

## 2017-06-20 ENCOUNTER — Other Ambulatory Visit: Payer: Self-pay

## 2017-06-20 NOTE — Patient Outreach (Signed)
[facility]) Care Management  06/20/2017  Regina Hood 1940-07-02 [id number]   Telephone call to patient for follow up after receiving unable to reach letter.  No answer.  HIPAA compliant voice message left.  Plan: RN CM will wait patient return call.  Jone Baseman, RN, MSN [facility] Care Management Care Management Coordinator Direct Line [phone] Toll Free: [phone]  Fax: [phone]

## 2017-06-21 ENCOUNTER — Inpatient Hospital Stay: Payer: PPO | Attending: Oncology

## 2017-06-21 ENCOUNTER — Inpatient Hospital Stay (HOSPITAL_BASED_OUTPATIENT_CLINIC_OR_DEPARTMENT_OTHER): Payer: PPO | Admitting: Oncology

## 2017-06-21 ENCOUNTER — Inpatient Hospital Stay: Payer: PPO

## 2017-06-21 ENCOUNTER — Encounter: Payer: Self-pay | Admitting: Oncology

## 2017-06-21 VITALS — BP 113/77 | HR 106 | Temp 97.4°F | Resp 18 | Ht 62.0 in | Wt 150.5 lb

## 2017-06-21 DIAGNOSIS — C221 Intrahepatic bile duct carcinoma: Secondary | ICD-10-CM | POA: Diagnosis not present

## 2017-06-21 DIAGNOSIS — D649 Anemia, unspecified: Secondary | ICD-10-CM | POA: Insufficient documentation

## 2017-06-21 DIAGNOSIS — R531 Weakness: Secondary | ICD-10-CM

## 2017-06-21 DIAGNOSIS — M329 Systemic lupus erythematosus, unspecified: Secondary | ICD-10-CM | POA: Insufficient documentation

## 2017-06-21 DIAGNOSIS — R21 Rash and other nonspecific skin eruption: Secondary | ICD-10-CM

## 2017-06-21 DIAGNOSIS — Z5111 Encounter for antineoplastic chemotherapy: Secondary | ICD-10-CM

## 2017-06-21 DIAGNOSIS — R109 Unspecified abdominal pain: Secondary | ICD-10-CM | POA: Diagnosis not present

## 2017-06-21 DIAGNOSIS — E860 Dehydration: Secondary | ICD-10-CM

## 2017-06-21 DIAGNOSIS — E876 Hypokalemia: Secondary | ICD-10-CM

## 2017-06-21 LAB — COMPREHENSIVE METABOLIC PANEL
ALK PHOS: 302 U/L — AB (ref 38–126)
ALT: 32 U/L (ref 14–54)
AST: 44 U/L — AB (ref 15–41)
Albumin: 3.2 g/dL — ABNORMAL LOW (ref 3.5–5.0)
Anion gap: 8 (ref 5–15)
BUN: 15 mg/dL (ref 6–20)
CALCIUM: 8.9 mg/dL (ref 8.9–10.3)
CO2: 27 mmol/L (ref 22–32)
CREATININE: 0.75 mg/dL (ref 0.44–1.00)
Chloride: 100 mmol/L — ABNORMAL LOW (ref 101–111)
GFR calc non Af Amer: >60 mL/min (ref >60)
GLUCOSE: 101 mg/dL — AB (ref 65–99)
Potassium: 3.8 mmol/L (ref 3.5–5.1)
Sodium: 135 mmol/L (ref 135–145)
Total Bilirubin: 0.7 mg/dL (ref 0.3–1.2)
Total Protein: 7.4 g/dL (ref 6.5–8.1)

## 2017-06-21 LAB — CBC WITH DIFFERENTIAL/PLATELET
BASOS ABS: 0 10*3/uL (ref 0–0.1)
Basophils Relative: 1 %
EOS ABS: 0.2 10*3/uL (ref 0–0.7)
Eosinophils Relative: 3 %
HCT: 28.5 % — ABNORMAL LOW (ref 35.0–47.0)
HEMOGLOBIN: 9.3 g/dL — AB (ref 12.0–16.0)
LYMPHS ABS: 0.8 10*3/uL — AB (ref 1.0–3.6)
LYMPHS PCT: 11 %
MCH: 29.2 pg (ref 26.0–34.0)
MCHC: 32.6 g/dL (ref 32.0–36.0)
MCV: 89.8 fL (ref 80.0–100.0)
Monocytes Absolute: 1.1 10*3/uL — ABNORMAL HIGH (ref 0.2–0.9)
Monocytes Relative: 14 %
NEUTROS PCT: 71 %
Neutro Abs: 5.3 10*3/uL (ref 1.4–6.5)
Platelets: 209 10*3/uL (ref 150–440)
RBC: 3.17 MIL/uL — AB (ref 3.80–5.20)
RDW: 16.2 % — ABNORMAL HIGH (ref 11.5–14.5)
WBC: 7.4 10*3/uL (ref 3.6–11.0)

## 2017-06-21 LAB — MAGNESIUM: Magnesium: 1.5 mg/dL — ABNORMAL LOW (ref 1.7–2.4)

## 2017-06-21 MED ORDER — SODIUM CHLORIDE 0.9 % IV SOLN
INTRAVENOUS | Status: DC
  Administered 2017-06-21: 12:00:00 via INTRAVENOUS
  Filled 2017-06-21: qty 1000

## 2017-06-21 MED ORDER — HEPARIN SOD (PORK) LOCK FLUSH 100 UNIT/ML IV SOLN
500.0000 [IU] | Freq: Once | INTRAVENOUS | Status: AC
  Administered 2017-06-21: 500 [IU] via INTRAVENOUS

## 2017-06-21 MED ORDER — ONDANSETRON HCL 4 MG/2ML IJ SOLN
8.0000 mg | Freq: Once | INTRAMUSCULAR | Status: AC
  Administered 2017-06-21: 8 mg via INTRAVENOUS
  Filled 2017-06-21: qty 4

## 2017-06-21 MED ORDER — SODIUM CHLORIDE 0.9 % IV SOLN: 2.0000 g | Freq: Once | INTRAVENOUS | Status: DC

## 2017-06-21 MED ORDER — MAGNESIUM SULFATE 2 GM/50ML IV SOLN
2.0000 g | Freq: Once | INTRAVENOUS | Status: AC
  Administered 2017-06-21: 2 g via INTRAVENOUS
  Filled 2017-06-21: qty 50

## 2017-06-21 MED ORDER — SODIUM CHLORIDE 0.9 % IV SOLN: Freq: Once | INTRAVENOUS | Status: DC

## 2017-06-21 MED ORDER — SODIUM CHLORIDE 0.9 % IV SOLN
INTRAVENOUS | Status: DC | PRN
  Filled 2017-06-21: qty 1000

## 2017-06-21 NOTE — Progress Notes (Signed)
No new changes noted today 

## 2017-06-21 NOTE — Progress Notes (Signed)
Hematology/Oncology Follow up note [facility] [phone] [phone]  Patient Care Team: Idelle Crouch, MD as PCP - General (Internal Medicine) Jon Billings, RN as [place] Management REASON FOR VISIT Follow up for treatment of cholangiocarcinoma  PERTINENT ONCOLOGY HISTORY Regina Hood 77 y.o.  female with PMH listed as below, most significantly for cholangiocarcinoma who has been on chemotherapy treatment presents to transfer her cancer care to our cancer center.  After extensive medical record review, summary of her diagnosis and treatment history are listed below.  1. Initially presented in 07/2016 to an outside ED with lower and upper GI bleed.  A. She was found to have new LFTs elevations including AST 340, ALT 379, Alk phos 420 and t bili 0.9.   B. [place] was obtained, which did not visualize the left lobe.  C. She was evaluated by gastroenterology because of her history of UC and underwent colonoscopy, which was unremarkable per path report.  2. 08/20/16, CT noted diffuse atrophy of the left lobe with left intrahepatic biliary ductal dilatation, with 4.1 cm ill-defined low-attenuation mass. This mass was suspicious for malignancy.  3. 08/2016, MRI/MRCP which showed abnormal left hepatic lobe with diffuse biliary dilatation with a shrunken left hepatic lobe and a 2.42.9 cm oval-shaped masslike component displacing the adjacent bile ducts. There is an approximately 1.6 cm segment truncated bile ducts involving common hepatic duct and right and left hepatic although no dilatation of the right hepatic duct system was seen.   4. 09/02/16, ERCP showed localized biliary stricture in the left intrahepatic duct with dilation of the left intrahepatic branches. Sphincterotomy was performed and a stent was placed. EUS with a 3.3 cm mass and left duct dilation.  5. Repeat ERCP was done 09/10/16, during which the stent was exchanged.    6. 09/20/16, taken to OR and noted to a have an peritoneal implant with biopsy c/w adenocarcinoma. The neoplastic cells are positive for CK7. They are negative for CK20, CDX-2, TTF-1, Napsin A, hep-par1, glypican 3, and arginase. The immunohistochemical features are consistent with adenocarcinoma of upper gastrointestinal tract or pancreaticobiliary origin.  7. 09/28/16, initially seen in medical oncology clinic by Dr. Filomena Jungling 8. 09/29/16, admitted for biliary obstruction and cholangitis, s/p PBD placement, d/c 10/06/16 9. 10/12/16, initiated on gemcitabine (400 mg/m2). CA 19-9 167 10. 10/26/16, increased gemcitabine (800 mg/m2) 11. 11/23/2016 Cycle 3 Gemzar (874m/m2), 12/07/2016, Gemzar (8020mm2) added cisplatin 2536m2  12. 12/14/2016, CT CAP shows slight interval decrease in size of L hepatic lobe cholangio. Slight interval increase in size of porta hepatis lymph node whiel other nodes are slightly smaller. Multiple bilateral thyroid nodules. Stable mediastinal lymph nodes. CA 19-9 60 14  8/30 Cycle 4 Gemzar (800m68m) added cisplatin 20mg61m 15, 12/28/2016 Gemcitabine 800mg/31mcisplatin was hold due to anemia. S/p 1 PRBC transfusion.   [facility]   [facility] [place]    She forgot her appointment on day 8 and Gemcitabine 800mg/m45md cisplatin 20mg/m268m given  Day 9 of cycle 4 (01/05/2017).        01/19/2017  Cycle 5 Day 1Gemcitabine 800mg/m2,17mplatin 20mg/m2, 39munit blood transfusion.        01/26/2017 Cycle 5 Day 8 Gemcitabine 800mg/m2, c39matin 20mg/m2    74mShe can not tolerate Gemcitabine and Cisplatin due to cytopenia and profound weakness. She was continued on Gemcitabine  single agent. Last chemotherapy was 12/19.    16 She has been chronically on dexamethasone since she started her treatment in [place]. She was originally on 4 mg of dexamethasone when she transition her care to me. I have weaned down to 1 mg, however her skin rash got worse.  After recent hospitalization at [facility], the 54m Dexamethasone was discontinued after discharge. Due to profound weakness and the abrupt stop of Dexamethasone, I start patient on 521mprednisone on 05/16/2017.    17 She has [facility] She spiked fever one week later and had hyperbilirubinemia and was treated for possible infection. Her blood culture was negative for multiple times.     06 May 2017, Patient's PPD has been converted [facility] and also abdominal pain.  She called [facility] and was instructed to have labs done locally and if no rising of bilirubin level, no intervention was needed.  At my office, she has had labs done which reviewed normal bilirubin level.  She appears dehydrated and she received a hydration session at out infusion center. .HMarland Kitchenr biliary drains have about 10-20 cc/day output, draining well since she was instructed to flush it.     INTERVAL HISTORY Regina VENDETTI89.o.  female with cholangiocarcinoma follows up after hospital discharge. S/p PBD exchange.  Patient reports continues feeling tired and not ready for resuming chemotherapy. Fatigue level improves after restarted on low dose prednisone. Her rash has not improved.  She has daily bowel movements, sometimes 2-3 times, usually small hard stool balls. She takes Senna 2 tablets daily. No loose stool.  She takes Slow Mag one tablet daily.  She reports feeling itchy at the taper sites where her PBD tubes were secured. She requests me to help her to remove those tape.    Review of Systems  Constitutional: Positive for fatigue. Negative for appetite change, chills and diaphoresis.  HENT:   Negative for hearing loss, mouth sores, nosebleeds and sore throat.   Eyes: Negative for eye problems and icterus.  Respiratory: Negative for cough, hemoptysis and  shortness of breath.        Use oxygen as needed at home, has not need to use lately.  Cardiovascular: Negative for chest pain and leg swelling.  Gastrointestinal: Negative for abdominal pain, blood in stool, constipation and diarrhea.  Endocrine: Negative for hot flashes.  Genitourinary: Negative for difficulty urinating, dyspareunia, dysuria, frequency and nocturia.   Musculoskeletal: Negative for arthralgias, back pain, flank pain and myalgias.  Skin: Positive for itching and rash.        rash on back improves, rash on her arms persist.   Neurological: Negative for dizziness, headaches, numbness and seizures.  Hematological: Negative for adenopathy.  Psychiatric/Behavioral: Negative for confusion and depression. The patient is not nervous/anxious.    MEDICAL HISTORY:  Past Medical History:  Diagnosis Date  . Arthritis   . Cancer (HCLaguna [place]  . Cellulitis   . Collagen vascular disease (HCBer[place]  . COPD (chronic obstructive pulmonary disease) ([place]  . DDD (degenerative disc disease), lumbar   . Depression   . Diabetes mellitus   . Diverticulitis   . GERD (gastroesophageal reflux disease)   . GI bleed   . Headache(784.0)   . Hypertension   . Lupus   . Ulcerative colitis (HCBlaine  SURGICAL HISTORY: Past Surgical History:  Procedure Laterality Date  . ABDOMINAL HYSTERECTOMY    . BREAST BIOPSY Right   . COLONOSCOPY    . COLONOSCOPY WITH PROPOFOL N/A 08/18/2016   Procedure: COLONOSCOPY WITH PROPOFOL;  Surgeon: Manya Silvas, MD;  Location[facility] END[facility]COPY;  Service: Endoscopy;  Laterality: N/A;  . ESOPHAGOGASTRODUODENOSCOPY (EGD) WITH PROPOFOL N/A 08/18/2016   Procedure: ESOPHAGOGASTRODUODENOSCOPY (EGD) WITH PROPOFOL;  Surgeon: Manya Silvas, MD;  Location: [facility] ENDOSCOPY;  Service: Endoscopy;  Laterality: N/A;  . IR BILIARY DRAIN PLACEMENT WITH CHOLANGIOGRAM  06/08/2017  . ORIF HUMERUS FRACTURE  09/17/2011   Procedure: OPEN REDUCTION INTERNAL FIXATION (ORIF) PROXIMAL HUMERUS  FRACTURE;  Surgeon: Augustin Schooling, MD;  Location: [place];  Service: Orthopedics;  Laterality: Left;    SOCIAL HISTORY: Social History   Socioeconomic History  . Marital status: Widowed    Spouse name: Not on file  . Number of children: Not on file  . Years of education: Not on file  . Highest education level: Not on file  Social Needs  . Financial resource strain: Not on file  . Food insecurity - worry: Not on file  . Food insecurity - inability: Not on file  . Transportation needs - medical: Not on file  . Transportation needs - non-medical: Not on file  Occupational History  . Not on file  Tobacco Use  . Smoking status: Former Smoker    Packs/day: 1.00    Years: 10.00    Pack years: 10.00    Types: Cigarettes    Last attempt to quit: 12/30/1998    Years since quitting: 18.4  . Smokeless tobacco: Never Used  . Tobacco comment: quit smoking in 2003  Substance and Sexual Activity  . Alcohol use: No  . Drug use: No  . Sexual activity: Not on file  Other Topics Concern  . Not on file  Social History Narrative  . Not on file    FAMILY HISTORY: Family History  Problem Relation Age of Onset  . Breast cancer Mother 58  . COPD Father   . Prostate cancer Brother     ALLERGIES:  is allergic to penicillins.  MEDICATIONS:  Current Outpatient Medications  Medication Sig Dispense Refill  . acetaminophen (TYLENOL) 500 MG tablet Take 1,000 mg by mouth 3 (three) times daily.    Marland Kitchen amLODipine (NORVASC) 10 MG tablet Take 1 tablet (10 mg total) by mouth daily. 30 tablet 0  . calcium carbonate 100 mg/ml SUSP Take by mouth daily.     . Cyanocobalamin (VITAMIN B-12 PO) Take 1,000 mcg by mouth daily.    Marland Kitchen estradiol (ESTRACE) 1 MG tablet Take 1 mg by mouth daily.    Marland Kitchen gabapentin (NEURONTIN) 100 MG capsule Take 100 mg by mouth 2 (two) times daily.     . hydroxychloroquine (PLAQUENIL) 200 MG tablet Take 200 mg by mouth 2 (two) times daily.     Marland Kitchen lidocaine-prilocaine (EMLA) cream Apply  1 application topically as needed. 30 g 3  . LORazepam (ATIVAN) 0.5 MG tablet Take 0.5 mg every 6 (six) hours as needed by mouth.  0  . magnesium chloride (SLOW-MAG) 64 MG TBEC SR tablet Take 1 tablet (64 mg total) by mouth daily. 30 tablet 1  . metFORMIN (GLUCOPHAGE) 500 MG tablet Take 500 mg by mouth 2 (two) times daily with a meal.    . metoprolol tartrate (LOPRESSOR) 25 MG tablet Take 1 tablet (25 mg total) by mouth 2 (two) times daily. [place]  tablet 0  . ondansetron (ZOFRAN) 8 MG tablet TAKE ONE TABLET EVERY 12 HOURS AS NEEDED 30 tablet 3  . oxyCODONE (OXY IR/ROXICODONE) 5 MG immediate release tablet Take 1 tablet (5 mg total) by mouth every 6 (six) hours as needed. 120 tablet 0  . pantoprazole (PROTONIX) 40 MG tablet Take 1 tablet (40 mg total) by mouth daily. 30 tablet 3  . potassium chloride SA (K-DUR,KLOR-CON) 20 MEQ tablet Take 1 tablet (20 mEq total) by mouth daily. 30 tablet 0  . predniSONE (DELTASONE) 1 MG tablet Take 5 tablets (5 mg total) by mouth daily with breakfast. 90 tablet 0  . prochlorperazine (COMPAZINE) 10 MG tablet Take 1 tablet (10 mg total) by mouth every 6 (six) hours as needed. 30 tablet 3  . senna (SENOKOT) 8.6 MG TABS tablet Take 2 tablets (17.2 mg total) by mouth daily. Hold for loose stools. 120 each 3  . sodium chloride 0.9 % injection Flush each biliary drains (2) with 10 mL two times daily.    Marland Kitchen venlafaxine XR (EFFEXOR-XR) 75 MG 24 hr capsule Take 225 mg by mouth daily.     Regino Schultze Bandages & Supports (MEDICAL COMPRESSION STOCKINGS) MISC 2 each by Does not apply route daily. (Patient not taking: Reported on 06/14/2017) 2 each 1  . Sodium Chloride Flush (NORMAL SALINE FLUSH) 0.9 % SOLN 1 mL by Other route every 8 (eight) hours. Flush each biliary drain with 1 mL twice daily (Patient not taking: Reported on 06/14/2017) 84 Syringe 3  . zolpidem (AMBIEN) 10 MG tablet Take 1 tablet (10 mg total) by mouth at bedtime as needed. 30 tablet 3   No current facility-administered  medications for this visit.    Facility-Administered Medications Ordered in Other Visits  Medication Dose Route Frequency Provider Last Rate Last Dose  . 0.9 %  sodium chloride infusion   Intravenous PRN Earlie Server, MD   Stopped at 04/27/17 1400  . 0.9 %  sodium chloride infusion   Intravenous Continuous Earlie Server, MD 10 mL/hr at 06/21/17 1210    . heparin lock flush 100 unit/mL  500 Units Intravenous Once Earlie Server, MD          .  PHYSICAL EXAMINATION: ECOG PERFORMANCE STATUS: 2 - Symptomatic, <50% confined to bed Vitals:   06/21/17 1133  BP: 113/77  Pulse: (!) 106  Resp: 18  Temp: (!) 97.4 F (36.3 C)  SpO2: 96%   Filed Weights   06/21/17 1133  Weight: 150 lb 8 oz (68.3 kg)   Physical Exam  Constitutional: She is oriented to person, place, and time and well-developed, well-nourished, and in no distress. No distress.  HENT:  Head: Normocephalic and atraumatic.  Mouth/Throat: Oropharynx is clear and moist. No oropharyngeal exudate.  Eyes: EOM are normal. Pupils are equal, round, and reactive to light. No scleral icterus.  Neck: Normal range of motion. Neck supple. No JVD present.  Cardiovascular: Regular rhythm and normal heart sounds.  No murmur heard. Pulmonary/Chest: Effort normal and breath sounds normal. No respiratory distress. She has no wheezes. She has no rales.  Abdominal: Soft. Bowel sounds are normal. She exhibits no distension. There is no guarding.  Right PBD drain suture came off and appears dislocated.  Bile leak,   Musculoskeletal: Normal range of motion. She exhibits no edema or deformity.  trace edema.  Lymphadenopathy:    She has no cervical adenopathy.  Neurological: She is oriented to person, place, and time. No cranial nerve deficit.  Skin:  Skin is dry. She is not diaphoretic. There is erythema.  Erythematous rash on both upper extremities and back, slightly better. Her chronic lupus rash mainly concentrated on her anterior chest which looks stable.     Psychiatric: Affect and judgment normal.      LABORATORY DATA:  I have reviewed the data as listed Lab Results  Component Value Date   WBC 7.4 06/21/2017   HGB 9.3 (L) 06/21/2017   HCT 28.5 (L) 06/21/2017   MCV 89.8 06/21/2017   PLT 209 06/21/2017   Recent Labs    04/29/17 0320  06/08/17 0504 06/09/17 0519 06/14/17 1001 06/21/17 1059  NA 138   < > 138  --  133* 135  K 3.4*   < > 4.4  --  3.9 3.8  CL 101   < > 102  --  101 100*  CO2 28   < > 26  --  25 27  GLUCOSE 87   < > 77  --  153* 101*  BUN 13   < > 17  --  13 15  CREATININE 0.62   < > 0.66  --  0.64 0.75  CALCIUM 8.3*   < > 8.7*  --  8.7* 8.9  GFRNONAA >60   < > >60  --  >60 >60  GFRAA >60   < > >60  --  >60 >60  PROT 6.7   < > 6.5 6.5 7.2 7.4  ALBUMIN 2.9*   < > 3.0* 2.8* 3.1* 3.2*  AST 63*   < > 53* 52* 54* 44*  ALT 51   < > 64* 58* 45 32  ALKPHOS 297*   < > 285* 269* 344* 302*  BILITOT 3.1*   < > 0.6 0.7 0.5 0.7  BILIDIR 1.9*  --   --  0.2  --   --   IBILI 1.2*  --   --  0.5  --   --    < > = values in this interval not displayed.   [place] CA 19-9   09/14/16 220 --> 09/28/16 176--> 10/12/16 167-->12/07/2016 64-->12/14/2016 60. --> 12/29/2016 87--> 01/26/2017 99-->03/02/2017 53-->03/23/2017 72   RADIOGRAPHIC STUDIES: I have personally reviewed the radiological images as listed and agreed with the findings in the report. CT 12/14/2016 [facility] Impression: 1. Slight interval decrease in size of the left hepatic lobe cholangiocarcinoma. Slight interval decrease in size of the hypoattenuating left hepatic lobe lesion which now measures 3.6 x 2.8 cm, previously 3.9 x 2.9 cm 2. Slight interval increase in size of a porta hepatis lymph node. Other nodes are slightly smaller. 3. Multiple bilateral thyroid nodules. Recommend ultrasound for further evaluation. 4. Stable mediastinal lymph nodes.  CT chest abdomen pelvis 02/24/2017 comparing to 08/25/16 Study in [facility].  1. No acute findings  within the chest, abdomen or pelvis. 2. There has been interval decrease in size of mass associated with left lobe of liver. No specific findings identified to suggest metastatic disease.Mass within the anterior and central aspect of the left lobe appears decreased in size from previous exam measuring 3.0 x 3.2 cm, 3. Bilateral percutaneous biliary drainage catheters are in place without significant biliary ductal dilatation. 4. Bilateral pleural calcifications and chronic interstitial changes of pulmonary fibrosis. Prominent mediastinal lymph nodes are nonspecific in the setting of interstitial lung disease.  CT abdomen pelvis w contrast 04/18/2017 comparing to 11.28/2018 study 2.8 x 2.5 cm hypoenhancing mass in segment 3 of the  liver, mildly decreased. Associated atrophy of the lateral segment left hepatic lobe. Two indwelling biliary stent, unchanged. Small nodes in the porta hepatis measuring up to 11 mm short axis, stable versus mildly decreased. No evidence of new/progressive metastatic disease.  CT abdomen pelvis w contrast 04/29/2017 comparing to 04/18/2017 exam.  Stable exam.  No new or progressive findings. 2.5 x 2.1 cm liver lesion, stable appearance of 2 bilary drains.    ASSESSMENT & PLAN:  Cancer Staging Cholangiocarcinoma [facility]) Staging form: Intrahepatic Bile Duct, AJCC 8th Edition - Clinical stage from 12/29/2016: Stage IV (cT1, cNX, pM1) - Signed by Earlie Server, MD on 12/29/2016  1. Cholangiocarcinoma ([place])   2. Encounter for antineoplastic chemotherapy   3. Systemic lupus erythematosus, unspecified SLE type, unspecified organ involvement status ([place])   4. Generalized weakness   5. Hypomagnesemia    # Cholangiocarcinoma: stable on CT scan. Patient feels weak and not ready to restart treatment. Will hold for anther 2 weeks.   # Abdominal Pain/PBD dislocation: s/p PBD exchange. Bilirubin stable :) # Skin rash: no dramatic improvement while on steroids. I advise her to follow  up with rheumatology to see if this is lupus flare.   # Fatigue and weakness: continue 38m prednisone. ?Adrenal insufficiency, previously checked her cortisol and level was normal.  #  Continue Protonix 437mdaily.  # hypomagnesia, plan Magnesium sulfate IV 2g x 1 today. I advise patient to take Slow Mag 6480mID and see if this will help improve magnesium level.    Return of visit: 1 week  ZhoEarlie ServerD, PhD Hematology Oncology [facility]- [id number]

## 2017-06-24 ENCOUNTER — Other Ambulatory Visit: Payer: Self-pay | Admitting: Oncology

## 2017-06-24 ENCOUNTER — Telehealth: Payer: Self-pay | Admitting: *Deleted

## 2017-06-24 DIAGNOSIS — J449 Chronic obstructive pulmonary disease, unspecified: Secondary | ICD-10-CM | POA: Diagnosis not present

## 2017-06-24 DIAGNOSIS — C221 Intrahepatic bile duct carcinoma: Secondary | ICD-10-CM

## 2017-06-24 MED ORDER — NORMAL SALINE FLUSH 0.9 % IV SOLN: 1.0000 mL | Freq: Three times a day (TID) | INTRAVENOUS | 3 refills | Status: AC

## 2017-06-24 NOTE — Telephone Encounter (Signed)
Medication refilled

## 2017-06-24 NOTE — Telephone Encounter (Signed)
This is a Dr. Tasia Catchings patient. I will forward to [place].

## 2017-06-24 NOTE — Telephone Encounter (Addendum)
Patient claims she is to have 10 ml syringes of NS and that she uses 10 ml to flush her bili tube please reorder 10 ml syringes if this is correct

## 2017-06-27 ENCOUNTER — Other Ambulatory Visit: Payer: Self-pay

## 2017-06-27 NOTE — Patient Outreach (Signed)
[facility]) Care Management  06/27/2017  CAYLYN TEDESCHI [birth year] [phone]   Multiple attempts to establish contact with patient without success. Case is being closed at this time.   Plan: RN CM will notify care management assistant of case status.    Jone Baseman, RN, MSN [facility] Care Management Care Management Coordinator Direct Line [phone] Toll Free: [phone]  Fax: [phone]

## 2017-06-28 ENCOUNTER — Inpatient Hospital Stay (HOSPITAL_BASED_OUTPATIENT_CLINIC_OR_DEPARTMENT_OTHER): Payer: PPO | Admitting: Oncology

## 2017-06-28 ENCOUNTER — Inpatient Hospital Stay: Payer: PPO

## 2017-06-28 ENCOUNTER — Encounter: Payer: Self-pay | Admitting: Oncology

## 2017-06-28 VITALS — BP 109/69 | HR 103 | Temp 97.5°F | Wt 153.3 lb

## 2017-06-28 VITALS — BP 122/75 | HR 98 | Temp 97.0°F | Resp 20

## 2017-06-28 DIAGNOSIS — C221 Intrahepatic bile duct carcinoma: Secondary | ICD-10-CM

## 2017-06-28 DIAGNOSIS — M329 Systemic lupus erythematosus, unspecified: Secondary | ICD-10-CM | POA: Diagnosis not present

## 2017-06-28 DIAGNOSIS — D649 Anemia, unspecified: Secondary | ICD-10-CM | POA: Diagnosis not present

## 2017-06-28 DIAGNOSIS — E860 Dehydration: Secondary | ICD-10-CM

## 2017-06-28 DIAGNOSIS — E876 Hypokalemia: Secondary | ICD-10-CM

## 2017-06-28 DIAGNOSIS — R1013 Epigastric pain: Secondary | ICD-10-CM

## 2017-06-28 LAB — CBC WITH DIFFERENTIAL/PLATELET
BASOS ABS: 0 10*3/uL (ref 0–0.1)
Basophils Relative: 1 %
EOS ABS: 0.2 10*3/uL (ref 0–0.7)
EOS PCT: 2 %
HCT: 28.2 % — ABNORMAL LOW (ref 35.0–47.0)
HEMOGLOBIN: 9.2 g/dL — AB (ref 12.0–16.0)
LYMPHS ABS: 0.5 10*3/uL — AB (ref 1.0–3.6)
LYMPHS PCT: 7 %
MCH: 29.2 pg (ref 26.0–34.0)
MCHC: 32.8 g/dL (ref 32.0–36.0)
MCV: 89.2 fL (ref 80.0–100.0)
Monocytes Absolute: 0.9 10*3/uL (ref 0.2–0.9)
Monocytes Relative: 13 %
NEUTROS PCT: 77 %
Neutro Abs: 5.6 10*3/uL (ref 1.4–6.5)
PLATELETS: 219 10*3/uL (ref 150–440)
RBC: 3.16 MIL/uL — AB (ref 3.80–5.20)
RDW: 15.5 % — ABNORMAL HIGH (ref 11.5–14.5)
WBC: 7.2 10*3/uL (ref 3.6–11.0)

## 2017-06-28 LAB — COMPREHENSIVE METABOLIC PANEL
ALT: 38 U/L (ref 14–54)
AST: 53 U/L — AB (ref 15–41)
Albumin: 3.1 g/dL — ABNORMAL LOW (ref 3.5–5.0)
Alkaline Phosphatase: 336 U/L — ABNORMAL HIGH (ref 38–126)
Anion gap: 9 (ref 5–15)
BUN: 16 mg/dL (ref 6–20)
CHLORIDE: 102 mmol/L (ref 101–111)
CO2: 25 mmol/L (ref 22–32)
Calcium: 8.6 mg/dL — ABNORMAL LOW (ref 8.9–10.3)
Creatinine, Ser: 0.71 mg/dL (ref 0.44–1.00)
Glucose, Bld: 145 mg/dL — ABNORMAL HIGH (ref 65–99)
POTASSIUM: 4.1 mmol/L (ref 3.5–5.1)
Sodium: 136 mmol/L (ref 135–145)
TOTAL PROTEIN: 7 g/dL (ref 6.5–8.1)
Total Bilirubin: 0.4 mg/dL (ref 0.3–1.2)

## 2017-06-28 LAB — MAGNESIUM: MAGNESIUM: 1.6 mg/dL — AB (ref 1.7–2.4)

## 2017-06-28 MED ORDER — SODIUM CHLORIDE 0.9 % IV SOLN: 2.0000 g | Freq: Once | INTRAVENOUS | Status: DC

## 2017-06-28 MED ORDER — HEPARIN SOD (PORK) LOCK FLUSH 100 UNIT/ML IV SOLN
500.0000 [IU] | Freq: Once | INTRAVENOUS | Status: AC
  Administered 2017-06-28: 500 [IU] via INTRAVENOUS
  Filled 2017-06-28: qty 5

## 2017-06-28 MED ORDER — MAGNESIUM SULFATE 2 GM/50ML IV SOLN
2.0000 g | Freq: Once | INTRAVENOUS | Status: AC
  Administered 2017-06-28: 2 g via INTRAVENOUS
  Filled 2017-06-28: qty 50

## 2017-06-28 NOTE — Progress Notes (Addendum)
Hematology/Oncology Follow up note [facility] [phone] [phone]  Patient Care Team: Idelle Crouch, MD as PCP - General (Internal Medicine) REASON FOR VISIT Follow up for treatment of cholangiocarcinoma  PERTINENT ONCOLOGY HISTORY Regina Hood 77 y.o.  female with PMH listed as below, most significantly for cholangiocarcinoma who has been on chemotherapy treatment presents to transfer her cancer care to our cancer center.  After extensive medical record review, summary of her diagnosis and treatment history are listed below.  1. Initially presented in 07/2016 to an outside ED with lower and upper GI bleed.  A. She was found to have new LFTs elevations including AST 340, ALT 379, Alk phos 420 and t bili 0.9.   B. [place] was obtained, which did not visualize the left lobe.  C. She was evaluated by gastroenterology because of her history of UC and underwent colonoscopy, which was unremarkable per path report.  2. 08/20/16, CT noted diffuse atrophy of the left lobe with left intrahepatic biliary ductal dilatation, with 4.1 cm ill-defined low-attenuation mass. This mass was suspicious for malignancy.  3. 08/2016, MRI/MRCP which showed abnormal left hepatic lobe with diffuse biliary dilatation with a shrunken left hepatic lobe and a 2.42.9 cm oval-shaped masslike component displacing the adjacent bile ducts. There is an approximately 1.6 cm segment truncated bile ducts involving common hepatic duct and right and left hepatic although no dilatation of the right hepatic duct system was seen.   4. 09/02/16, ERCP showed localized biliary stricture in the left intrahepatic duct with dilation of the left intrahepatic branches. Sphincterotomy was performed and a stent was placed. EUS with a 3.3 cm mass and left duct dilation.  5. Repeat ERCP was done 09/10/16, during which the stent was exchanged.  6. 09/20/16, taken to OR and noted to a have an peritoneal  implant with biopsy c/w adenocarcinoma. The neoplastic cells are positive for CK7. They are negative for CK20, CDX-2, TTF-1, Napsin A, hep-par1, glypican 3, and arginase. The immunohistochemical features are consistent with adenocarcinoma of upper gastrointestinal tract or pancreaticobiliary origin.  7. 09/28/16, initially seen in medical oncology clinic by Dr. Filomena Jungling 8. 09/29/16, admitted for biliary obstruction and cholangitis, s/p PBD placement, d/c 10/06/16 9. 10/12/16, initiated on gemcitabine (400 mg/m2). CA 19-9 167 10. 10/26/16, increased gemcitabine (800 mg/m2) 11. 11/23/2016 Cycle 3 Gemzar (872m/m2), 12/07/2016, Gemzar (8015mm2) added cisplatin 2564m2  12. 12/14/2016, CT CAP shows slight interval decrease in size of L hepatic lobe cholangio. Slight interval increase in size of porta hepatis lymph node whiel other nodes are slightly smaller. Multiple bilateral thyroid nodules. Stable mediastinal lymph nodes. CA 19-9 60 14  8/30 Cycle 4 Gemzar (800m48m) added cisplatin 20mg67m 15, 12/28/2016 Gemcitabine 800mg/42mcisplatin was hold due to anemia. S/p 1 PRBC transfusion.   [facility]   [facility] [place]    She forgot her appointment on day 8 and Gemcitabine 800mg/m60md cisplatin 20mg/m240m given  Day 9 of cycle 4 (01/05/2017).        01/19/2017  Cycle 5 Day 1Gemcitabine 800mg/m2,58mplatin 20mg/m2, 62munit blood transfusion.        01/26/2017 Cycle 5 Day 8 Gemcitabine 800mg/m2, c60matin 20mg/m2    60mShe can not tolerate Gemcitabine and Cisplatin due to cytopenia and profound weakness. She was continued on Gemcitabine        single agent. Last chemotherapy was 12/19.  16 She has been chronically on dexamethasone since she started her treatment in [place]. She was originally on 4 mg of dexamethasone when she transition her care to me. I have weaned down to 1 mg, however her skin rash got worse. After recent hospitalization at [facility], the 77m Dexamethasone was  discontinued after discharge. Due to profound weakness and the abrupt stop of Dexamethasone, I start patient on 537mprednisone on 05/16/2017.    17 She has 2 [facility] She spiked fever one week later and had hyperbilirubinemia and was treated for possible infection. Her blood culture was negative for multiple times.     06 May 2017, Patient's PPD has been converted to externally drained and [facility] and also abdominal pain.  She called [facility] and was instructed to have labs done locally and if no rising of bilirubin level, no intervention was needed.  At my office, she has had labs done which reviewed normal bilirubin level.  She appears dehydrated and she received a hydration session at out infusion center. .HMarland Kitchenr biliary drains have about 10-20 cc/day output, draining well since she was instructed to flush it. # [facility]   INTERVAL HISTORY Regina [age]AR89.o.  female with cholangiocarcinoma follows up after hospital discharge. S/p PBD exchange.  Patient reports continues feeling tired and not ready for resuming chemotherapy. Fatigue level improves after restarted on low dose prednisone. Her rash has not improved.  She has daily bowel movements, sometimes 2-3 times, usually small hard stool balls. She takes Senna 2 tablets daily. No loose stool.  She takes Slow Mag 1 tablet twice a day. Feeling slightly better. Continue to have rash on her arms, chest and back. Taking prednisone 77m19maily.      Review of Systems  Constitutional: Positive for fatigue. Negative for appetite change, chills and diaphoresis.  HENT:   Negative for hearing loss, mouth sores, nosebleeds and sore throat.   Eyes: Negative for eye problems and icterus.  Respiratory: Negative for cough, hemoptysis and shortness of breath.         Use oxygen as needed at home, has not need to use lately.  Cardiovascular: Negative for chest pain and leg swelling.  Gastrointestinal: Negative for abdominal pain, blood in stool, constipation and diarrhea.  Endocrine: Negative for hot flashes.  Genitourinary: Negative for difficulty urinating, dyspareunia, dysuria, frequency and nocturia.   Musculoskeletal: Negative for arthralgias, back pain, flank pain and myalgias.  Skin: Positive for itching and rash.        rash on back improves, rash on her arms persist.   Neurological: Negative for dizziness, headaches, numbness and seizures.  Hematological: Negative for adenopathy.  Psychiatric/Behavioral: Negative for confusion and depression. The patient is not nervous/anxious.    MEDICAL HISTORY:  Past Medical History:  Diagnosis Date  . Arthritis   . Cancer ([place] . Cellulitis   . Collagen vascular disease ([facility] . COPD (chronic obstructive pulmonary disease) ([facility] . DDD (degenerative disc disease), lumbar   . Depression   . Diabetes mellitus   . Diverticulitis   . GERD (gastroesophageal reflux disease)   . GI bleed   . Headache(784.0)   . Hypertension   . Lupus   . Ulcerative colitis ([facility]   SURGICAL HISTORY: Past  Surgical History:  Procedure Laterality Date  . ABDOMINAL HYSTERECTOMY    . BREAST BIOPSY Right   . COLONOSCOPY    . COLONOSCOPY WITH PROPOFOL N/A 08/18/2016   Procedure: COLONOSCOPY WITH PROPOFOL;  Surgeon: Manya Silvas, MD;  Location: [facility] ENDOSCOPY;  Service: Endoscopy;  Laterality: N/A;  . ESOPHAGOGASTRODUODENOSCOPY (EGD) WITH PROPOFOL N/A 08/18/2016   Procedure: ESOPHAGOGASTRODUODENOSCOPY (EGD) WITH PROPOFOL;  Surgeon: Manya Silvas, MD;  Location[facility] ENDOSCOPY;  Service: Endoscopy;  Laterality: N/A;  . IR BILIARY DRAIN PLACEMENT WITH CHOLANGIOGRAM  06/08/2017  . ORIF HUMERUS FRACTURE  09/17/2011   Procedure: OPEN REDUCTION INTERNAL FIXATION (ORIF) PROXIMAL HUMERUS FRACTURE;  Surgeon: Augustin Schooling,  MD;  Location: [place];  Service: Orthopedics;  Laterality: Left;    SOCIAL HISTORY: Social History   Socioeconomic History  . Marital status: Widowed    Spouse name: Not on file  . Number of children: Not on file  . Years of education: Not on file  . Highest education level: Not on file  Social Needs  . Financial resource strain: Not on file  . Food insecurity - worry: Not on file  . Food insecurity - inability: Not on file  . Transportation needs - medical: Not on file  . Transportation needs - non-medical: Not on file  Occupational History  . Not on file  Tobacco Use  . Smoking status: Former Smoker    Packs/day: 1.00    Years: 10.00    Pack years: 10.00    Types: Cigarettes    Last attempt to quit: 12/30/1998    Years since quitting: 18.5  . Smokeless tobacco: Never Used  . Tobacco comment: quit smoking in 2003  Substance and Sexual Activity  . Alcohol use: No  . Drug use: No  . Sexual activity: Not on file  Other Topics Concern  . Not on file  Social History Narrative  . Not on file    FAMILY HISTORY: Family History  Problem Relation Age of Onset  . Breast cancer Mother 56  . COPD Father   . Prostate cancer Brother     ALLERGIES:  is allergic to penicillins.  MEDICATIONS:  Current Outpatient Medications  Medication Sig Dispense Refill  . acetaminophen (TYLENOL) 500 MG tablet Take 1,000 mg by mouth 3 (three) times daily.    Marland Kitchen amLODipine (NORVASC) 10 MG tablet Take 1 tablet (10 mg total) by mouth daily. 30 tablet 0  . calcium carbonate 100 mg/ml SUSP Take by mouth daily.     . Cyanocobalamin (VITAMIN B-12 PO) Take 1,000 mcg by mouth daily.    Regino Schultze Bandages & Supports (MEDICAL COMPRESSION STOCKINGS) MISC 2 each by Does not apply route daily. (Patient not taking: Reported on 06/14/2017) 2 each 1  . estradiol (ESTRACE) 1 MG tablet Take 1 mg by mouth daily.    Marland Kitchen gabapentin (NEURONTIN) 100 MG capsule Take 100 mg by mouth 2 (two) times daily.     .  hydroxychloroquine (PLAQUENIL) 200 MG tablet Take 200 mg by mouth 2 (two) times daily.     Marland Kitchen lidocaine-prilocaine (EMLA) cream Apply 1 application topically as needed. 30 g 3  . LORazepam (ATIVAN) 0.5 MG tablet Take 0.5 mg every 6 (six) hours as needed by mouth.  0  . magnesium chloride (SLOW-MAG) 64 MG TBEC SR tablet Take 1 tablet (64 mg total) by mouth daily. 30 tablet 1  . metFORMIN (GLUCOPHAGE) 500 MG tablet Take 500 mg by mouth 2 (two) times daily with a  meal.    . metoprolol tartrate (LOPRESSOR) 25 MG tablet Take 1 tablet (25 mg total) by mouth 2 (two) times daily. 60 tablet 0  . ondansetron (ZOFRAN) 8 MG tablet TAKE ONE TABLET EVERY 12 HOURS AS NEEDED 30 tablet 3  . oxyCODONE (OXY IR/ROXICODONE) 5 MG immediate release tablet Take 1 tablet (5 mg total) by mouth every 6 (six) hours as needed. 120 tablet 0  . pantoprazole (PROTONIX) 40 MG tablet Take 1 tablet (40 mg total) by mouth daily. 30 tablet 3  . potassium chloride SA (K-DUR,KLOR-CON) 20 MEQ tablet Take 1 tablet (20 mEq total) by mouth daily. 30 tablet 0  . predniSONE (DELTASONE) 1 MG tablet Take 5 tablets (5 mg total) by mouth daily with breakfast. 90 tablet 0  . prochlorperazine (COMPAZINE) 10 MG tablet Take 1 tablet (10 mg total) by mouth every 6 (six) hours as needed. 30 tablet 3  . senna (SENOKOT) 8.6 MG TABS tablet Take 2 tablets (17.2 mg total) by mouth daily. Hold for loose stools. 120 each 3  . sodium chloride 0.9 % injection Flush each biliary drains (2) with 10 mL two times daily.    . Sodium Chloride Flush (NORMAL SALINE FLUSH) 0.9 % SOLN 1 mL by Other route every 8 (eight) hours. Flush each biliary drain with 1 mL twice daily 84 Syringe 3  . venlafaxine XR (EFFEXOR-XR) 75 MG 24 hr capsule Take 225 mg by mouth daily.     Marland Kitchen zolpidem (AMBIEN) 10 MG tablet Take 1 tablet (10 mg total) by mouth at bedtime as needed. 30 tablet 3   No current facility-administered medications for this visit.    Facility-Administered Medications  Ordered in Other Visits  Medication Dose Route Frequency Provider Last Rate Last Dose  . 0.9 %  sodium chloride infusion   Intravenous PRN Earlie Server, MD   Stopped at 04/27/17 1400  . heparin lock flush 100 unit/mL  500 Units Intravenous Once Earlie Server, MD          .  PHYSICAL EXAMINATION: ECOG PERFORMANCE STATUS: 2 - Symptomatic, <50% confined to bed Vitals:   06/28/17 1647  BP: 109/69  Pulse: (!) 103  Temp: (!) 97.5 F (36.4 C)   Filed Weights   06/28/17 1647  Weight: 153 lb 5 oz (69.5 kg)   Physical Exam  Constitutional: She is oriented to person, place, and time and well-developed, well-nourished, and in no distress. No distress.  HENT:  Head: Normocephalic and atraumatic.  Mouth/Throat: Oropharynx is clear and moist. No oropharyngeal exudate.  Eyes: EOM are normal. Pupils are equal, round, and reactive to light. No scleral icterus.  Neck: Normal range of motion. Neck supple. No JVD present.  Cardiovascular: Regular rhythm and normal heart sounds.  No murmur heard. Pulmonary/Chest: Effort normal and breath sounds normal. No respiratory distress. She has no wheezes. She has no rales.  Abdominal: Soft. Bowel sounds are normal. She exhibits no distension. There is no tenderness. There is no guarding.  Right PBD drain suture came off and appears dislocated.  Bile leak,   Musculoskeletal: Normal range of motion. She exhibits no edema or deformity.  trace edema.  Lymphadenopathy:    She has no cervical adenopathy.  Neurological: She is oriented to person, place, and time. No cranial nerve deficit.  Skin: Skin is dry. She is not diaphoretic. There is erythema.  Erythematous rash on both upper extremities and back, slightly better. Her chronic lupus rash mainly concentrated on her anterior chest which looks  stable.   Psychiatric: Mood, affect and judgment normal.      LABORATORY DATA:  I have reviewed the data as listed Lab Results  Component Value Date   WBC 7.2  06/28/2017   HGB 9.2 (L) 06/28/2017   HCT 28.2 (L) 06/28/2017   MCV 89.2 06/28/2017   PLT 219 06/28/2017   Recent Labs    04/29/17 0320  06/09/17 0519 06/14/17 1001 06/21/17 1059 06/28/17 1030  NA 138   < >  --  133* 135 136  K 3.4*   < >  --  3.9 3.8 4.1  CL 101   < >  --  101 100* 102  CO2 28   < >  --  25 27 25   GLUCOSE 87   < >  --  153* 101* 145*  BUN 13   < >  --  13 15 16   CREATININE 0.62   < >  --  0.64 0.75 0.71  CALCIUM 8.3*   < >  --  8.7* 8.9 8.6*  GFRNONAA >60   < >  --  >60 >60 >60  GFRAA >60   < >  --  >60 >60 >60  PROT 6.7   < > 6.5 7.2 7.4 7.0  ALBUMIN 2.9*   < > 2.8* 3.1* 3.2* 3.1*  AST 63*   < > 52* 54* 44* 53*  ALT 51   < > 58* 45 32 38  ALKPHOS 297*   < > 269* 344* 302* 336*  BILITOT 3.1*   < > 0.7 0.5 0.7 0.4  BILIDIR 1.9*  --  0.2  --   --   --   IBILI 1.2*  --  0.5  --   --   --    < > = values in this interval not displayed.   [place] CA 19-9   09/14/16 220 --> 09/28/16 176--> 10/12/16 167-->12/07/2016 64-->12/14/2016 60. --> 12/29/2016 87--> 01/26/2017 99-->03/02/2017 53-->03/23/2017 72   RADIOGRAPHIC STUDIES: I have personally reviewed the radiological images as listed and agreed with the findings in the report. CT 12/14/2016 [facility] Impression: 1. Slight interval decrease in size of the left hepatic lobe cholangiocarcinoma. Slight interval decrease in size of the hypoattenuating left hepatic lobe lesion which now measures 3.6 x 2.8 cm, previously 3.9 x 2.9 cm 2. Slight interval increase in size of a porta hepatis lymph node. Other nodes are slightly smaller. 3. Multiple bilateral thyroid nodules. Recommend ultrasound for further evaluation. 4. Stable mediastinal lymph nodes.  CT chest abdomen pelvis 02/24/2017 comparing to 08/25/16 Study in [facility].  1. No acute findings within the chest, abdomen or pelvis. 2. There has been interval decrease in size of mass associated with left lobe of liver. No specific findings  identified to suggest metastatic disease.Mass within the anterior and central aspect of the left lobe appears decreased in size from previous exam measuring 3.0 x 3.2 cm, 3. Bilateral percutaneous biliary drainage catheters are in place without significant biliary ductal dilatation. 4. Bilateral pleural calcifications and chronic interstitial changes of pulmonary fibrosis. Prominent mediastinal lymph nodes are nonspecific in the setting of interstitial lung disease.  CT abdomen pelvis w contrast 04/18/2017 comparing to 11.28/2018 study 2.8 x 2.5 cm hypoenhancing mass in segment 3 of the liver, mildly decreased. Associated atrophy of the lateral segment left hepatic lobe. Two indwelling biliary stent, unchanged. Small nodes in the porta hepatis measuring up to 11 mm short axis, stable versus mildly  decreased. No evidence of new/progressive metastatic disease.  CT abdomen pelvis w contrast 04/29/2017 comparing to 04/18/2017 exam.  Stable exam.  No new or progressive findings. 2.5 x 2.1 cm liver lesion, stable appearance of 2 bilary drains.    ASSESSMENT & PLAN:  Cancer Staging Cholangiocarcinoma [facility]) Staging form: Intrahepatic Bile Duct, AJCC 8th Edition - Clinical stage from 12/29/2016: Stage IV (cT1, cNX, pM1) - Signed by Earlie Server, MD on 12/29/2016  1. Cholangiocarcinoma ([place])   2. Systemic lupus erythematosus, unspecified SLE type, unspecified organ involvement status ([place])   3. Hypomagnesemia   4. Anemia, unspecified type    #Hypomagnesia, will give magnesium sulfate 2g x 1 today, continue taking Slow Mag 58m BID.  # Fatigue and weakness: continue 521mprednisone. ?Adrenal insufficiency, previously checked her cortisol and level was normal.  # Epigastric discomfort: I suggested GI work up patient declined. Continue taking protonix 4084maily.  Monitor hemoglobin.  # Skin rash, likely her lupus flare. Will arrange appointment for patient to see her rheumatologist.  # Anemia: hemoglobin  stable. Check folate and ferritin. B12 was checked in December and was normal.   Return of visit: 1 week for assessment prior to resuming chemotherapy.   ZhoEarlie ServerD, PhD Hematology Oncology [facility]- [id number]

## 2017-06-29 LAB — CANCER ANTIGEN 19-9: CA 19-9: 65 U/mL — ABNORMAL HIGH (ref 0–35)

## 2017-07-04 ENCOUNTER — Other Ambulatory Visit (HOSPITAL_COMMUNITY): Payer: Self-pay | Admitting: Interventional Radiology

## 2017-07-04 DIAGNOSIS — R1084 Generalized abdominal pain: Secondary | ICD-10-CM

## 2017-07-05 ENCOUNTER — Inpatient Hospital Stay: Payer: PPO

## 2017-07-05 ENCOUNTER — Other Ambulatory Visit: Payer: Self-pay | Admitting: *Deleted

## 2017-07-05 ENCOUNTER — Other Ambulatory Visit: Payer: Self-pay

## 2017-07-05 ENCOUNTER — Encounter: Payer: Self-pay | Admitting: Oncology

## 2017-07-05 ENCOUNTER — Inpatient Hospital Stay (HOSPITAL_BASED_OUTPATIENT_CLINIC_OR_DEPARTMENT_OTHER): Payer: PPO | Admitting: Oncology

## 2017-07-05 ENCOUNTER — Inpatient Hospital Stay: Payer: PPO | Admitting: *Deleted

## 2017-07-05 VITALS — BP 119/75 | HR 103 | Temp 97.2°F | Resp 18 | Wt 152.7 lb

## 2017-07-05 DIAGNOSIS — Z5111 Encounter for antineoplastic chemotherapy: Secondary | ICD-10-CM

## 2017-07-05 DIAGNOSIS — C221 Intrahepatic bile duct carcinoma: Secondary | ICD-10-CM

## 2017-07-05 DIAGNOSIS — M329 Systemic lupus erythematosus, unspecified: Secondary | ICD-10-CM | POA: Diagnosis not present

## 2017-07-05 DIAGNOSIS — D649 Anemia, unspecified: Secondary | ICD-10-CM

## 2017-07-05 DIAGNOSIS — R21 Rash and other nonspecific skin eruption: Secondary | ICD-10-CM

## 2017-07-05 DIAGNOSIS — Z95828 Presence of other vascular implants and grafts: Secondary | ICD-10-CM

## 2017-07-05 DIAGNOSIS — E876 Hypokalemia: Secondary | ICD-10-CM

## 2017-07-05 DIAGNOSIS — R1084 Generalized abdominal pain: Secondary | ICD-10-CM

## 2017-07-05 LAB — MAGNESIUM: MAGNESIUM: 1.7 mg/dL (ref 1.7–2.4)

## 2017-07-05 LAB — COMPREHENSIVE METABOLIC PANEL
ALT: 41 U/L (ref 14–54)
AST: 60 U/L — AB (ref 15–41)
Albumin: 3.3 g/dL — ABNORMAL LOW (ref 3.5–5.0)
Alkaline Phosphatase: 335 U/L — ABNORMAL HIGH (ref 38–126)
Anion gap: 10 (ref 5–15)
BILIRUBIN TOTAL: 0.5 mg/dL (ref 0.3–1.2)
BUN: 17 mg/dL (ref 6–20)
CALCIUM: 8.9 mg/dL (ref 8.9–10.3)
CO2: 25 mmol/L (ref 22–32)
CREATININE: 0.71 mg/dL (ref 0.44–1.00)
Chloride: 98 mmol/L — ABNORMAL LOW (ref 101–111)
GFR calc Af Amer: >60 mL/min (ref >60)
Glucose, Bld: 146 mg/dL — ABNORMAL HIGH (ref 65–99)
POTASSIUM: 3.7 mmol/L (ref 3.5–5.1)
Sodium: 133 mmol/L — ABNORMAL LOW (ref 135–145)
TOTAL PROTEIN: 7.4 g/dL (ref 6.5–8.1)

## 2017-07-05 LAB — CBC WITH DIFFERENTIAL/PLATELET
BASOS ABS: 0 10*3/uL (ref 0–0.1)
Basophils Relative: 0 %
Eosinophils Absolute: 0.1 10*3/uL (ref 0–0.7)
Eosinophils Relative: 1 %
HEMATOCRIT: 28.9 % — AB (ref 35.0–47.0)
Hemoglobin: 9.7 g/dL — ABNORMAL LOW (ref 12.0–16.0)
Lymphocytes Relative: 6 %
Lymphs Abs: 0.5 10*3/uL — ABNORMAL LOW (ref 1.0–3.6)
MCH: 29.4 pg (ref 26.0–34.0)
MCHC: 33.5 g/dL (ref 32.0–36.0)
MCV: 88 fL (ref 80.0–100.0)
MONO ABS: 1.3 10*3/uL — AB (ref 0.2–0.9)
Monocytes Relative: 14 %
Neutro Abs: 7.6 10*3/uL — ABNORMAL HIGH (ref 1.4–6.5)
Neutrophils Relative %: 79 %
Platelets: 186 10*3/uL (ref 150–440)
RBC: 3.29 MIL/uL — ABNORMAL LOW (ref 3.80–5.20)
RDW: 15.2 % — AB (ref 11.5–14.5)
WBC: 9.6 10*3/uL (ref 3.6–11.0)

## 2017-07-05 LAB — IRON AND TIBC
IRON: 46 ug/dL (ref 28–170)
Saturation Ratios: 12 % (ref 10.4–31.8)
TIBC: 379 ug/dL (ref 250–450)
UIBC: 333 ug/dL

## 2017-07-05 LAB — FOLATE: FOLATE: 11.5 ng/mL (ref >5.9)

## 2017-07-05 LAB — FERRITIN: FERRITIN: 62 ng/mL (ref 11–307)

## 2017-07-05 MED ORDER — HEPARIN SOD (PORK) LOCK FLUSH 100 UNIT/ML IV SOLN
500.0000 [IU] | INTRAVENOUS | Status: AC | PRN
  Administered 2017-07-05: 500 [IU]

## 2017-07-05 MED ORDER — SODIUM CHLORIDE 0.9% FLUSH
10.0000 mL | INTRAVENOUS | Status: AC | PRN
  Administered 2017-07-05: 10 mL
  Filled 2017-07-05: qty 10

## 2017-07-05 NOTE — Progress Notes (Signed)
Here for follow up

## 2017-07-05 NOTE — Progress Notes (Signed)
Hematology/Oncology Follow up note [facility] [phone] [phone]  Patient Care Team: Idelle Crouch, MD as PCP - General (Internal Medicine) REASON FOR VISIT Follow up for treatment of cholangiocarcinoma  PERTINENT ONCOLOGY HISTORY Regina Hood 77 y.o.  female with PMH listed as below, most significantly for cholangiocarcinoma who has been on chemotherapy treatment presents to transfer her cancer care to our cancer center.  After extensive medical record review, summary of her diagnosis and treatment history are listed below.  1. Initially presented in 07/2016 to an outside ED with lower and upper GI bleed.  A. She was found to have new LFTs elevations including AST 340, ALT 379, Alk phos 420 and t bili 0.9.   B. [place] was obtained, which did not visualize the left lobe.  C. She was evaluated by gastroenterology because of her history of UC and underwent colonoscopy, which was unremarkable per path report.  2. 08/20/16, CT noted diffuse atrophy of the left lobe with left intrahepatic biliary ductal dilatation, with 4.1 cm ill-defined low-attenuation mass. This mass was suspicious for malignancy.  3. 08/2016, MRI/MRCP which showed abnormal left hepatic lobe with diffuse biliary dilatation with a shrunken left hepatic lobe and a 2.42.9 cm oval-shaped masslike component displacing the adjacent bile ducts. There is an approximately 1.6 cm segment truncated bile ducts involving common hepatic duct and right and left hepatic although no dilatation of the right hepatic duct system was seen.   4. 09/02/16, ERCP showed localized biliary stricture in the left intrahepatic duct with dilation of the left intrahepatic branches. Sphincterotomy was performed and a stent was placed. EUS with a 3.3 cm mass and left duct dilation.  5. Repeat ERCP was done 09/10/16, during which the stent was exchanged.  6. 09/20/16, taken to OR and noted to a have an peritoneal  implant with biopsy c/w adenocarcinoma. The neoplastic cells are positive for CK7. They are negative for CK20, CDX-2, TTF-1, Napsin A, hep-par1, glypican 3, and arginase. The immunohistochemical features are consistent with adenocarcinoma of upper gastrointestinal tract or pancreaticobiliary origin.  7. 09/28/16, initially seen in medical oncology clinic by Dr. Filomena Jungling 8. 09/29/16, admitted for biliary obstruction and cholangitis, s/p PBD placement, d/c 10/06/16 9. 10/12/16, initiated on gemcitabine (400 mg/m2). CA 19-9 167 10. 10/26/16, increased gemcitabine (800 mg/m2) 11. 11/23/2016 Cycle 3 Gemzar (872m/m2), 12/07/2016, Gemzar (8015mm2) added cisplatin 2564m2  12. 12/14/2016, CT CAP shows slight interval decrease in size of L hepatic lobe cholangio. Slight interval increase in size of porta hepatis lymph node whiel other nodes are slightly smaller. Multiple bilateral thyroid nodules. Stable mediastinal lymph nodes. CA 19-9 60 14  8/30 Cycle 4 Gemzar (800m48m) added cisplatin 20mg67m 15, 12/28/2016 Gemcitabine 800mg/42mcisplatin was hold due to anemia. S/p 1 PRBC transfusion.   [facility]   [facility] [place]    She forgot her appointment on day 8 and Gemcitabine 800mg/m60md cisplatin 20mg/m240m given  Day 9 of cycle 4 (01/05/2017).        01/19/2017  Cycle 5 Day 1Gemcitabine 800mg/m2,58mplatin 20mg/m2, 62munit blood transfusion.        01/26/2017 Cycle 5 Day 8 Gemcitabine 800mg/m2, c60matin 20mg/m2    60mShe can not tolerate Gemcitabine and Cisplatin due to cytopenia and profound weakness. She was continued on Gemcitabine        single agent. Last chemotherapy was 12/19.  16 She has been chronically on dexamethasone since she started her treatment in [place]. She was originally on 4 mg of dexamethasone when she transition her care to me. I have weaned down to 1 mg, however her skin rash got worse. After recent hospitalization at [facility], the 59m Dexamethasone was  discontinued after discharge. Due to profound weakness and the abrupt stop of Dexamethasone, I start patient on 518mprednisone on 05/16/2017.    17 She has 2 [facility]. She spiked fever one week later and had hyperbilirubinemia and was treated for possible infection. Her blood culture was negative for multiple times.     06 May 2017, Patient's PPD has been converted to externally drained [facility] enough drainage and also abdominal pain.  She called [facility] and was instructed to have labs done locally and if no rising of bilirubin level, no intervention was needed.  At my office, she has had labs done which reviewed normal bilirubin level.  She appears dehydrated and she received a hydration session at out infusion center. .HMarland Kitchenr biliary drains have about 10-20 cc/day output, draining well since she was instructed to flush it. # [facility]   INTERVAL HISTORY Regina DASHER89.o.  female with cholangiocarcinoma follows up after hospital discharge. S/p PBD exchange.  Patient reports continues feeling tired and not ready for resuming chemotherapy. Fatigue level improves after restarted on low dose prednisone. Her rash has got worse, and now spread to her bilateral thigh, feel itchy.  Feeling slightly better. Continue to have rash on her arms, chest and back. Taking prednisone 77m7maily.      Review of Systems  Constitutional: Positive for fatigue. Negative for appetite change, chills and diaphoresis.  HENT:   Negative for hearing loss, mouth sores, nosebleeds and sore throat.   Eyes: Negative for eye problems and icterus.  Respiratory: Negative for cough, hemoptysis and shortness of breath.        Use oxygen as needed at home, has not need to use lately.  Cardiovascular: Negative for chest pain and leg swelling.    Gastrointestinal: Negative for abdominal pain, blood in stool, constipation and diarrhea.  Endocrine: Negative for hot flashes.  Genitourinary: Negative for difficulty urinating, dyspareunia, dysuria, frequency and nocturia.   Musculoskeletal: Negative for arthralgias, back pain, flank pain and myalgias.  Skin: Positive for itching and rash.        rash on back improves, with a few area of back developing papular rash. Rrash on her arms persist. Now also have papular erythematous rash on her thighs  Neurological: Negative for dizziness, headaches, numbness and seizures.  Hematological: Negative for adenopathy.  Psychiatric/Behavioral: Negative for confusion and depression. The patient is not nervous/anxious.    MEDICAL HISTORY:  Past Medical History:  Diagnosis Date  . Arthritis   . Cancer ([place] . Cellulitis   . Collagen vascular disease ([place]CC[place] . COPD (chronic obstructive pulmonary disease) ([place] . DDD (degenerative disc disease), lumbar   . Depression   . Diabetes mellitus   . Diverticulitis   . GERD (gastroesophageal reflux disease)   . GI bleed   . Headache(784.0)   . Hypertension   . Lupus   . Ulcerative colitis ([place]CC[place]   SURGICAL HISTORY: Past Surgical History:  Procedure Laterality  Date  . ABDOMINAL HYSTERECTOMY    . BREAST BIOPSY Right   . COLONOSCOPY    . COLONOSCOPY WITH PROPOFOL N/A 08/18/2016   Procedure: COLONOSCOPY WITH PROPOFOL;  Surgeon: Manya Silvas, MD;  Location: [facility] ENDOSCOPY;  Service: Endoscopy;  Laterality: N/A;  . ESOPHAGOGASTRODUODENOSCOPY (EGD) WITH PROPOFOL N/A 08/18/2016   Procedure: ESOPHAGOGASTRODUODENOSCOPY (EGD) WITH PROPOFOL;  Surgeon: Manya Silvas, MD;  Location: [facility] ENDOSCOPY;  Service: Endoscopy;  Laterality: N/A;  . IR BILIARY DRAIN PLACEMENT WITH CHOLANGIOGRAM  06/08/2017  . ORIF HUMERUS FRACTURE  09/17/2011   Procedure: OPEN REDUCTION INTERNAL FIXATION (ORIF) PROXIMAL HUMERUS FRACTURE;  Surgeon: Augustin Schooling, MD;   Location: [facility];  Service: Orthopedics;  Laterality: Left;    SOCIAL HISTORY: Social History   Socioeconomic History  . Marital status: Widowed    Spouse name: Not on file  . Number of children: Not on file  . Years of education: Not on file  . Highest education level: Not on file  Social Needs  . Financial resource strain: Not on file  . Food insecurity - worry: Not on file  . Food insecurity - inability: Not on file  . Transportation needs - medical: Not on file  . Transportation needs - non-medical: Not on file  Occupational History  . Not on file  Tobacco Use  . Smoking status: Former Smoker    Packs/day: 1.00    Years: 10.00    Pack years: 10.00    Types: Cigarettes    Last attempt to quit: 12/30/1998    Years since quitting: 18.5  . Smokeless tobacco: Never Used  . Tobacco comment: quit smoking in 2003  Substance and Sexual Activity  . Alcohol use: No  . Drug use: No  . Sexual activity: Not on file  Other Topics Concern  . Not on file  Social History Narrative  . Not on file    FAMILY HISTORY: Family History  Problem Relation Age of Onset  . Breast cancer Mother 70  . COPD Father   . Prostate cancer Brother     ALLERGIES:  is allergic to penicillins.  MEDICATIONS:  Current Outpatient Medications  Medication Sig Dispense Refill  . amLODipine (NORVASC) 10 MG tablet Take 1 tablet (10 mg total) by mouth daily. 30 tablet 0  . calcium carbonate 100 mg/ml SUSP Take by mouth daily.     . Cyanocobalamin (VITAMIN B-12 PO) Take 1,000 mcg by mouth daily.    Marland Kitchen gabapentin (NEURONTIN) 100 MG capsule Take 100 mg by mouth 2 (two) times daily.     . hydroxychloroquine (PLAQUENIL) 200 MG tablet Take 200 mg by mouth 2 (two) times daily.     Marland Kitchen lidocaine-prilocaine (EMLA) cream Apply 1 application topically as needed. 30 g 3  . LORazepam (ATIVAN) 0.5 MG tablet Take 0.5 mg every 6 (six) hours as needed by mouth.  0  . magnesium chloride (SLOW-MAG) 64 MG TBEC SR tablet Take 1  tablet (64 mg total) by mouth daily. 30 tablet 1  . metFORMIN (GLUCOPHAGE) 500 MG tablet Take 500 mg by mouth 2 (two) times daily with a meal.    . metoprolol tartrate (LOPRESSOR) 25 MG tablet Take 1 tablet (25 mg total) by mouth 2 (two) times daily. 60 tablet 0  . ondansetron (ZOFRAN) 8 MG tablet TAKE ONE TABLET EVERY 12 HOURS AS NEEDED 30 tablet 3  . pantoprazole (PROTONIX) 40 MG tablet Take 1 tablet (40 mg total) by mouth daily. 30 tablet 3  .  predniSONE (DELTASONE) 1 MG tablet Take 5 tablets (5 mg total) by mouth daily with breakfast. 90 tablet 0  . senna (SENOKOT) 8.6 MG TABS tablet Take 2 tablets (17.2 mg total) by mouth daily. Hold for loose stools. 120 each 3  . sodium chloride 0.9 % injection Flush each biliary drains (2) with 10 mL two times daily.    . Sodium Chloride Flush (NORMAL SALINE FLUSH) 0.9 % SOLN 1 mL by Other route every 8 (eight) hours. Flush each biliary drain with 1 mL twice daily 84 Syringe 3  . venlafaxine XR (EFFEXOR-XR) 75 MG 24 hr capsule Take 225 mg by mouth daily.     Marland Kitchen zolpidem (AMBIEN) 10 MG tablet Take 1 tablet (10 mg total) by mouth at bedtime as needed. 30 tablet 3  . acetaminophen (TYLENOL) 500 MG tablet Take 1,000 mg by mouth 3 (three) times daily.    Marland Kitchen oxyCODONE (OXY IR/ROXICODONE) 5 MG immediate release tablet Take 1 tablet (5 mg total) by mouth every 6 (six) hours as needed. (Patient not taking: Reported on 07/05/2017) 120 tablet 0  . potassium chloride SA (K-DUR,KLOR-CON) 20 MEQ tablet Take 1 tablet (20 mEq total) by mouth daily. (Patient not taking: Reported on 07/05/2017) 30 tablet 0  . prochlorperazine (COMPAZINE) 10 MG tablet Take 1 tablet (10 mg total) by mouth every 6 (six) hours as needed. (Patient not taking: Reported on 07/05/2017) 30 tablet 3   No current facility-administered medications for this visit.    Facility-Administered Medications Ordered in Other Visits  Medication Dose Route Frequency Provider Last Rate Last Dose  . 0.9 %  sodium  chloride infusion   Intravenous PRN Earlie Server, MD   Stopped at 04/27/17 1400  . heparin lock flush 100 unit/mL  500 Units Intravenous Once Earlie Server, MD          .  PHYSICAL EXAMINATION: ECOG PERFORMANCE STATUS: 2 - Symptomatic, <50% confined to bed Vitals:   07/05/17 0939  BP: 119/75  Pulse: (!) 103  Resp: 18  Temp: (!) 97.2 F (36.2 C)   Filed Weights   07/05/17 0939  Weight: 152 lb 11.2 oz (69.3 kg)   Physical Exam  Constitutional: She is oriented to person, place, and time and well-developed, well-nourished, and in no distress. No distress.  HENT:  Head: Normocephalic and atraumatic.  Mouth/Throat: Oropharynx is clear and moist. No oropharyngeal exudate.  Eyes: EOM are normal. Pupils are equal, round, and reactive to light. No scleral icterus.  Neck: Normal range of motion. Neck supple. No JVD present.  Cardiovascular: Regular rhythm and normal heart sounds.  No murmur heard. Pulmonary/Chest: Effort normal and breath sounds normal. No respiratory distress. She has no wheezes. She has no rales.  Abdominal: Soft. Bowel sounds are normal. She exhibits no distension. There is no tenderness. There is no guarding.  Right PBD drain suture came off and appears dislocated.  Bile leak,   Musculoskeletal: Normal range of motion. She exhibits no edema or deformity.  trace edema.  Lymphadenopathy:    She has no cervical adenopathy.  Neurological: She is oriented to person, place, and time. No cranial nerve deficit.  Skin: Skin is dry. She is not diaphoretic. There is erythema.  Erythematous rash on both upper extremities and back, slightly better. Her chronic lupus rash mainly concentrated on her anterior chest which looks stable.   Psychiatric: Mood, affect and judgment normal.      LABORATORY DATA:  I have reviewed the data as listed Lab Results  Component Value Date   WBC 9.6 07/05/2017   HGB 9.7 (L) 07/05/2017   HCT 28.9 (L) 07/05/2017   MCV 88.0 07/05/2017   PLT 186  07/05/2017   Recent Labs    04/29/17 0320  06/09/17 0519  06/21/17 1059 06/28/17 1030 07/05/17 0922  NA 138   < >  --    < > 135 136 133*  K 3.4*   < >  --    < > 3.8 4.1 3.7  CL 101   < >  --    < > 100* 102 98*  CO2 28   < >  --    < > 27 25 25   GLUCOSE 87   < >  --    < > 101* 145* 146*  BUN 13   < >  --    < > 15 16 17   CREATININE 0.62   < >  --    < > 0.75 0.71 0.71  CALCIUM 8.3*   < >  --    < > 8.9 8.6* 8.9  GFRNONAA >60   < >  --    < > >60 >60 >60  GFRAA >60   < >  --    < > >60 >60 >60  PROT 6.7   < > 6.5   < > 7.4 7.0 7.4  ALBUMIN 2.9*   < > 2.8*   < > 3.2* 3.1* 3.3*  AST 63*   < > 52*   < > 44* 53* 60*  ALT 51   < > 58*   < > 32 38 41  ALKPHOS 297*   < > 269*   < > 302* 336* 335*  BILITOT 3.1*   < > 0.7   < > 0.7 0.4 0.5  BILIDIR 1.9*  --  0.2  --   --   --   --   IBILI 1.2*  --  0.5  --   --   --   --    < > = values in this interval not displayed.   [place] CA 19-9   09/14/16 220 --> 09/28/16 176--> 10/12/16 167-->12/07/2016 64-->12/14/2016 60. --> 12/29/2016 87--> 01/26/2017 99-->03/02/2017 53-->03/23/2017 72   RADIOGRAPHIC STUDIES: I have personally reviewed the radiological images as listed and agreed with the findings in the report. CT 12/14/2016 [facility] Impression: 1. Slight interval decrease in size of the left hepatic lobe cholangiocarcinoma. Slight interval decrease in size of the hypoattenuating left hepatic lobe lesion which now measures 3.6 x 2.8 cm, previously 3.9 x 2.9 cm 2. Slight interval increase in size of a porta hepatis lymph node. Other nodes are slightly smaller. 3. Multiple bilateral thyroid nodules. Recommend ultrasound for further evaluation. 4. Stable mediastinal lymph nodes.  CT chest abdomen pelvis 02/24/2017 comparing to 08/25/16 Study in [facility].  1. No acute findings within the chest, abdomen or pelvis. 2. There has been interval decrease in size of mass associated with left lobe of liver. No specific  findings identified to suggest metastatic disease.Mass within the anterior and central aspect of the left lobe appears decreased in size from previous exam measuring 3.0 x 3.2 cm, 3. Bilateral percutaneous biliary drainage catheters are in place without significant biliary ductal dilatation. 4. Bilateral pleural calcifications and chronic interstitial changes of pulmonary fibrosis. Prominent mediastinal lymph nodes are nonspecific in the setting of interstitial lung disease.  CT abdomen pelvis w contrast 04/18/2017 comparing to 11.28/2018 study  2.8 x 2.5 cm hypoenhancing mass in segment 3 of the liver, mildly decreased. Associated atrophy of the lateral segment left hepatic lobe. Two indwelling biliary stent, unchanged. Small nodes in the porta hepatis measuring up to 11 mm short axis, stable versus mildly decreased. No evidence of new/progressive metastatic disease.  CT abdomen pelvis w contrast 04/29/2017 comparing to 04/18/2017 exam.  Stable exam.  No new or progressive findings. 2.5 x 2.1 cm liver lesion, stable appearance of 2 bilary drains.    ASSESSMENT & PLAN:  Cancer Staging Cholangiocarcinoma [facility]) Staging form: Intrahepatic Bile Duct, AJCC 8th Edition - Clinical stage from 12/29/2016: Stage IV (cT1, cNX, pM1) - Signed by Earlie Server, MD on 12/29/2016  1. Cholangiocarcinoma ([place])   2. Systemic lupus erythematosus, unspecified SLE type, unspecified organ involvement status ([place])   3. Hypomagnesemia   4. Encounter for antineoplastic chemotherapy   # Cholangiocarcinoma: plan to resume her on single agent Gemzar today.  patient declined chemotherapy today as she does not feel ready for it. I discuss with patient that we can continue monitor her disease and repeat images periodically.  Recently due to her percutaneous biliary drain issue, she has got multiple CT scans which showed stable disease.  Given the poor prognosis of cholangiocarcinoma and how stable she has been so far, I think it  is reasonable to continue closely monitor her disease if she desires no chemotherapy. Her tumor marker has been stable.    We will follow-up and reassess her in 2 weeks #Hypomagnesia, Magnesia is 1.7,  continue taking Slow Mag 46m BID.  # Fatigue and weakness: continue 546mprednisone. ?Adrenal insufficiency, previously checked her cortisol and level was normal.   # Skin rash, likely her lupus flare. Will arrange appointment for patient to see her rheumatologist.  Will also refer her to see a dermatologist for possible punch biopsy. For the itchiness, use benadryl cream topical PRN. # Anemia: Hemoglobin stable. Check folate and ferritin. B12 was checked in December and was normal.  Ferritin came back 62, iron TIBC pending.    Return of visit: 2 weeks for reassessment.  ZhEarlie ServerMD, PhD Hematology Oncology [facility]- [phone]/19/2019

## 2017-07-06 DIAGNOSIS — M320 Drug-induced systemic lupus erythematosus: Secondary | ICD-10-CM | POA: Diagnosis not present

## 2017-07-06 DIAGNOSIS — R21 Rash and other nonspecific skin eruption: Secondary | ICD-10-CM | POA: Diagnosis not present

## 2017-07-06 DIAGNOSIS — L258 Unspecified contact dermatitis due to other agents: Secondary | ICD-10-CM | POA: Diagnosis not present

## 2017-07-06 LAB — CANCER ANTIGEN 19-9: CAN 19-9: 75 U/mL — AB (ref 0–35)

## 2017-07-07 ENCOUNTER — Telehealth: Payer: Self-pay | Admitting: *Deleted

## 2017-07-07 ENCOUNTER — Inpatient Hospital Stay: Payer: PPO

## 2017-07-07 ENCOUNTER — Encounter: Payer: Self-pay | Admitting: Oncology

## 2017-07-07 ENCOUNTER — Inpatient Hospital Stay (HOSPITAL_BASED_OUTPATIENT_CLINIC_OR_DEPARTMENT_OTHER): Payer: PPO | Admitting: Oncology

## 2017-07-07 VITALS — BP 134/84 | HR 111 | Temp 98.8°F | Resp 16 | Wt 149.0 lb

## 2017-07-07 DIAGNOSIS — C221 Intrahepatic bile duct carcinoma: Secondary | ICD-10-CM

## 2017-07-07 DIAGNOSIS — R197 Diarrhea, unspecified: Secondary | ICD-10-CM

## 2017-07-07 DIAGNOSIS — E876 Hypokalemia: Secondary | ICD-10-CM | POA: Diagnosis not present

## 2017-07-07 DIAGNOSIS — R1032 Left lower quadrant pain: Secondary | ICD-10-CM | POA: Diagnosis not present

## 2017-07-07 DIAGNOSIS — Z5189 Encounter for other specified aftercare: Secondary | ICD-10-CM

## 2017-07-07 DIAGNOSIS — K5792 Diverticulitis of intestine, part unspecified, without perforation or abscess without bleeding: Secondary | ICD-10-CM

## 2017-07-07 DIAGNOSIS — E860 Dehydration: Secondary | ICD-10-CM

## 2017-07-07 LAB — CBC WITH DIFFERENTIAL/PLATELET
BASOS ABS: 0.1 10*3/uL (ref 0–0.1)
BASOS PCT: 1 %
Eosinophils Absolute: 0.1 10*3/uL (ref 0–0.7)
Eosinophils Relative: 1 %
HEMATOCRIT: 32.4 % — AB (ref 35.0–47.0)
Hemoglobin: 10.5 g/dL — ABNORMAL LOW (ref 12.0–16.0)
Lymphocytes Relative: 8 %
Lymphs Abs: 0.8 10*3/uL — ABNORMAL LOW (ref 1.0–3.6)
MCH: 28.8 pg (ref 26.0–34.0)
MCHC: 32.5 g/dL (ref 32.0–36.0)
MCV: 88.7 fL (ref 80.0–100.0)
MONO ABS: 1.6 10*3/uL — AB (ref 0.2–0.9)
Monocytes Relative: 15 %
NEUTROS ABS: 8.5 10*3/uL — AB (ref 1.4–6.5)
NEUTROS PCT: 75 %
PLATELETS: 230 10*3/uL (ref 150–440)
RBC: 3.66 MIL/uL — ABNORMAL LOW (ref 3.80–5.20)
RDW: 15.6 % — AB (ref 11.5–14.5)
WBC: 11.1 10*3/uL — ABNORMAL HIGH (ref 3.6–11.0)

## 2017-07-07 LAB — C DIFFICILE QUICK SCREEN W PCR REFLEX
C DIFFICLE (CDIFF) ANTIGEN: NEGATIVE
C Diff interpretation: NOT DETECTED
C Diff toxin: NEGATIVE

## 2017-07-07 LAB — COMPREHENSIVE METABOLIC PANEL
ALBUMIN: 3.2 g/dL — AB (ref 3.5–5.0)
ALK PHOS: 283 U/L — AB (ref 38–126)
ALT: 35 U/L (ref 14–54)
AST: 38 U/L (ref 15–41)
Anion gap: 10 (ref 5–15)
BUN: 17 mg/dL (ref 6–20)
CO2: 24 mmol/L (ref 22–32)
CREATININE: 0.58 mg/dL (ref 0.44–1.00)
Calcium: 8.9 mg/dL (ref 8.9–10.3)
Chloride: 100 mmol/L — ABNORMAL LOW (ref 101–111)
GFR calc Af Amer: >60 mL/min (ref >60)
GLUCOSE: 126 mg/dL — AB (ref 65–99)
POTASSIUM: 3.2 mmol/L — AB (ref 3.5–5.1)
Sodium: 134 mmol/L — ABNORMAL LOW (ref 135–145)
Total Bilirubin: 0.6 mg/dL (ref 0.3–1.2)
Total Protein: 7.4 g/dL (ref 6.5–8.1)

## 2017-07-07 LAB — MAGNESIUM: Magnesium: 1.8 mg/dL (ref 1.7–2.4)

## 2017-07-07 MED ORDER — DIPHENOXYLATE-ATROPINE 2.5-0.025 MG PO TABS
2.0000 | ORAL_TABLET | Freq: Once | ORAL | Status: AC
  Administered 2017-07-07: 2 via ORAL
  Filled 2017-07-07: qty 2

## 2017-07-07 MED ORDER — ONDANSETRON HCL 4 MG/2ML IJ SOLN
8.0000 mg | Freq: Once | INTRAMUSCULAR | Status: AC
  Administered 2017-07-07: 8 mg via INTRAVENOUS
  Filled 2017-07-07: qty 4

## 2017-07-07 MED ORDER — DIPHENOXYLATE-ATROPINE 2.5-0.025 MG PO TABS: 2.0000 | ORAL_TABLET | Freq: Four times a day (QID) | ORAL | 0 refills | Status: DC | PRN

## 2017-07-07 MED ORDER — DEXAMETHASONE SODIUM PHOSPHATE 10 MG/ML IJ SOLN
10.0000 mg | Freq: Once | INTRAMUSCULAR | Status: AC
  Administered 2017-07-07: 10 mg via INTRAVENOUS
  Filled 2017-07-07: qty 1

## 2017-07-07 MED ORDER — HEPARIN SOD (PORK) LOCK FLUSH 100 UNIT/ML IV SOLN
500.0000 [IU] | Freq: Once | INTRAVENOUS | Status: AC
  Administered 2017-07-07: 500 [IU] via INTRAVENOUS

## 2017-07-07 MED ORDER — POTASSIUM CHLORIDE CRYS ER 20 MEQ PO TBCR: 20.0000 meq | EXTENDED_RELEASE_TABLET | Freq: Every day | ORAL | 0 refills | Status: DC

## 2017-07-07 MED ORDER — SODIUM CHLORIDE 0.9 % IV SOLN
INTRAVENOUS | Status: DC
  Filled 2017-07-07: qty 1000

## 2017-07-07 MED ORDER — SODIUM CHLORIDE 0.9% FLUSH
10.0000 mL | INTRAVENOUS | Status: DC | PRN
  Administered 2017-07-07: 10 mL via INTRAVENOUS
  Filled 2017-07-07: qty 10

## 2017-07-07 MED ORDER — DEXAMETHASONE SODIUM PHOSPHATE 100 MG/10ML IJ SOLN: Freq: Once | INTRAMUSCULAR | Status: DC

## 2017-07-07 MED ORDER — SODIUM CHLORIDE 0.9 % IV SOLN
Freq: Once | INTRAVENOUS | Status: AC
  Administered 2017-07-07: 15:00:00 via INTRAVENOUS
  Filled 2017-07-07: qty 1000

## 2017-07-07 NOTE — Telephone Encounter (Signed)
Patient called to report that she has had diarrhea for 3 days and is requesting that medicine be sent in to pharmacy for her. She had chemotherapy 3/19 with Gemzar. Please advise

## 2017-07-07 NOTE — Progress Notes (Signed)
Patient here as an acute add on today for incessant diarrhea since Tuesday. She reports intermittent abdominal pain and cramping also. She denies having any nausea or vomiting.

## 2017-07-07 NOTE — Telephone Encounter (Signed)
Patient has accepted appointment this afternoon for lab . NP after discussing with Lorretta Harp, NP

## 2017-07-07 NOTE — Progress Notes (Signed)
Symptom Management Consult note [facility]  [phone] [phone]  Patient Care Team: Idelle Crouch, MD as PCP - General (Internal Medicine)   Name of the patient: Regina Hood  [id number]  12/15/1940   Date of visit: 07/08/17  Diagnosis- cholangiocarcinoma  Chief complaint/ Reason for visit- Diarrhea    Heme/Onc history: Last seen by primary oncologist. Dr. Tasia Catchings on 07/05/17 to possibly resume chemotherapy. She continued to feel tired and admitted to not be ready to resume chemotherapy. She was continuing her 5 mg prednisone. Her rash had become worse and began to spread to bilateral thigh. States it's itchy. Stable internal percutaneous biliary drain bilateral. Plan is to start with single agent Gemzar. She declined therapy at that time and wished to have 2 more weeks to think about it. She was found to have a low magnesium level at 1.7 and was started on Slow-Mag 64 mg twice a day. She was to continue 5 mg prednisone for her fatigue/weakness and rash. She was to have follow-up appointment with dermatologist given her lupus flare in to see a dermatologist for possible punch biopsy. Patient was scheduled to return in 2 weeks for reassessment.  Interval history-  Patient complains of diarrhea.  Symptoms have been present for approximately 2 days. The symptoms are stable.  Stool frequency is approximately 1 per hour. Patient estimates stool volume to be less than 1/2 cup per bowel movement.  Diarrhea does occur at night. The patient has noted no bleeding associated with bowel movements.  The also patient reports the following symptoms: abdominal cramping relieved by defecation, abdominal distension, bloating, loose stools and watery diarrhea.  The patient denies the following symptoms: fecal incontinence and fever. The patient admits to abdominal pain, located in the lower abdomen. The pain is described as aching, cramping and sharp, and is 10/10  in intensity.    Relationship to food: the patient denies any relationship to food.  Relationship to medications: the patient denies any relationship to medications.  Other risk factors: She is immunocompromised and most recent CT showed possible diverticulosis/possible diverticulitis. Therapy tried so far: none. Work up so far: none.  ECOG FS:2 - Symptomatic, <50% confined to bed  Review of systems- Review of Systems  Constitutional: Negative.  Negative for chills, fever, malaise/fatigue and weight loss.  HENT: Negative for congestion and ear pain.   Eyes: Negative.  Negative for blurred vision and double vision.  Respiratory: Negative.  Negative for cough, sputum production and shortness of breath.   Cardiovascular: Negative.  Negative for chest pain, palpitations and leg swelling.  Gastrointestinal: Positive for abdominal pain, diarrhea and nausea. Negative for constipation and vomiting.  Genitourinary: Negative for dysuria, frequency and urgency.  Musculoskeletal: Negative for back pain and falls.  Skin: Negative.  Negative for rash.  Neurological: Negative.  Negative for weakness and headaches.  Endo/Heme/Allergies: Negative.  Does not bruise/bleed easily.  Psychiatric/Behavioral: Negative.  Negative for depression. The patient is not nervous/anxious and does not have insomnia.      Current treatment- Observation  Allergies  Allergen Reactions  . Penicillins Nausea Only     Past Medical History:  Diagnosis Date  . Arthritis   . Cancer ([place])   . Cellulitis   . Collagen vascular disease ([place])   . COPD (chronic obstructive pulmonary disease) ([place])   . DDD (degenerative disc disease), lumbar   . Depression   . Diabetes mellitus   . Diverticulitis   . GERD (gastroesophageal reflux  disease)   . GI bleed   . Headache(784.0)   . Hypertension   . Lupus   . Ulcerative colitis [facility])      Past Surgical History:  Procedure Laterality Date  . ABDOMINAL HYSTERECTOMY    .  BREAST BIOPSY Right   . COLONOSCOPY    . COLONOSCOPY WITH PROPOFOL N/A 08/18/2016   Procedure: COLONOSCOPY WITH PROPOFOL;  Surgeon: Manya Silvas, MD;  Location: [facility] ENDOSCOPY;  Service: Endoscopy;  Laterality: N/A;  . ESOPHAGOGASTRODUODENOSCOPY (EGD) WITH PROPOFOL N/A 08/18/2016   Procedure: ESOPHAGOGASTRODUODENOSCOPY (EGD) WITH PROPOFOL;  Surgeon: Manya Silvas, MD;  Location: [facility] ENDOSCOPY;  Service: Endoscopy;  Laterality: N/A;  . IR BILIARY DRAIN PLACEMENT WITH CHOLANGIOGRAM  06/08/2017  . ORIF HUMERUS FRACTURE  09/17/2011   Procedure: OPEN REDUCTION INTERNAL FIXATION (ORIF) PROXIMAL HUMERUS FRACTURE;  Surgeon: Augustin Schooling, MD;  Location: [place];  Service: Orthopedics;  Laterality: Left;    Social History   Socioeconomic History  . Marital status: Widowed    Spouse name: Not on file  . Number of children: Not on file  . Years of education: Not on file  . Highest education level: Not on file  Occupational History  . Not on file  Social Needs  . Financial resource strain: Not on file  . Food insecurity:    Worry: Not on file    Inability: Not on file  . Transportation needs:    Medical: Not on file    Non-medical: Not on file  Tobacco Use  . Smoking status: Former Smoker    Packs/day: 1.00    Years: 10.00    Pack years: 10.00    Types: Cigarettes    Last attempt to quit: 12/30/1998    Years since quitting: 18.5  . Smokeless tobacco: Never Used  . Tobacco comment: quit smoking in 2003  Substance and Sexual Activity  . Alcohol use: No  . Drug use: No  . Sexual activity: Not on file  Lifestyle  . Physical activity:    Days per week: Not on file    Minutes per session: Not on file  . Stress: Not on file  Relationships  . Social connections:    Talks on phone: Not on file    Gets together: Not on file    Attends religious service: Not on file    Active member of club or organization: Not on file    Attends meetings of clubs or organizations: Not on file     Relationship status: Not on file  . Intimate partner violence:    Fear of current or ex partner: Not on file    Emotionally abused: Not on file    Physically abused: Not on file    Forced sexual activity: Not on file  Other Topics Concern  . Not on file  Social History Narrative  . Not on file    Family History  Problem Relation Age of Onset  . Breast cancer Mother 55  . COPD Father   . Prostate cancer Brother      Current Outpatient Medications:  .  acetaminophen (TYLENOL) 500 MG tablet, Take 1,000 mg by mouth 3 (three) times daily., Disp: , Rfl:  .  amLODipine (NORVASC) 10 MG tablet, Take 1 tablet (10 mg total) by mouth daily., Disp: 30 tablet, Rfl: 0 .  calcium carbonate 100 mg/ml SUSP, Take by mouth daily. , Disp: , Rfl:  .  Cyanocobalamin (VITAMIN B-12 PO), Take 1,000 mcg by mouth daily.,  Disp: , Rfl:  .  gabapentin (NEURONTIN) 100 MG capsule, Take 100 mg by mouth 2 (two) times daily. , Disp: , Rfl:  .  hydroxychloroquine (PLAQUENIL) 200 MG tablet, Take 200 mg by mouth 2 (two) times daily. , Disp: , Rfl:  .  lidocaine-prilocaine (EMLA) cream, Apply 1 application topically as needed., Disp: 30 g, Rfl: 3 .  LORazepam (ATIVAN) 0.5 MG tablet, Take 0.5 mg every 6 (six) hours as needed by mouth., Disp: , Rfl: 0 .  magnesium chloride (SLOW-MAG) 64 MG TBEC SR tablet, Take 1 tablet (64 mg total) by mouth daily., Disp: 30 tablet, Rfl: 1 .  metFORMIN (GLUCOPHAGE) 500 MG tablet, Take 500 mg by mouth 2 (two) times daily with a meal., Disp: , Rfl:  .  metoprolol tartrate (LOPRESSOR) 25 MG tablet, Take 1 tablet (25 mg total) by mouth 2 (two) times daily., Disp: 60 tablet, Rfl: 0 .  ciprofloxacin (CIPRO) 500 MG tablet, Take 1 tablet (500 mg total) by mouth 2 (two) times daily., Disp: 20 tablet, Rfl: 0 .  diphenoxylate-atropine (LOMOTIL) 2.5-0.025 MG tablet, Take 2 tablets by mouth 4 (four) times daily as needed for diarrhea or loose stools., Disp: 60 tablet, Rfl: 0 .  metroNIDAZOLE (FLAGYL)  500 MG tablet, Take 1 tablet (500 mg total) by mouth 3 (three) times daily., Disp: 30 tablet, Rfl: 0 .  ondansetron (ZOFRAN) 8 MG tablet, TAKE ONE TABLET EVERY 12 HOURS AS NEEDED, Disp: 30 tablet, Rfl: 3 .  oxyCODONE (OXY IR/ROXICODONE) 5 MG immediate release tablet, Take 1 tablet (5 mg total) by mouth every 6 (six) hours as needed. (Patient not taking: Reported on 07/05/2017), Disp: 120 tablet, Rfl: 0 .  pantoprazole (PROTONIX) 40 MG tablet, Take 1 tablet (40 mg total) by mouth daily., Disp: 30 tablet, Rfl: 3 .  potassium chloride SA (K-DUR,KLOR-CON) 20 MEQ tablet, Take 1 tablet (20 mEq total) by mouth daily., Disp: 30 tablet, Rfl: 0 .  predniSONE (DELTASONE) 1 MG tablet, Take 5 tablets (5 mg total) by mouth daily with breakfast., Disp: 90 tablet, Rfl: 0 .  prochlorperazine (COMPAZINE) 10 MG tablet, Take 1 tablet (10 mg total) by mouth every 6 (six) hours as needed. (Patient not taking: Reported on 07/05/2017), Disp: 30 tablet, Rfl: 3 .  senna (SENOKOT) 8.6 MG TABS tablet, Take 2 tablets (17.2 mg total) by mouth daily. Hold for loose stools., Disp: 120 each, Rfl: 3 .  sodium chloride 0.9 % injection, Flush each biliary drains (2) with 10 mL two times daily., Disp: , Rfl:  .  Sodium Chloride Flush (NORMAL SALINE FLUSH) 0.9 % SOLN, 1 mL by Other route every 8 (eight) hours. Flush each biliary drain with 1 mL twice daily, Disp: 84 Syringe, Rfl: 3 .  venlafaxine XR (EFFEXOR-XR) 75 MG 24 hr capsule, Take 225 mg by mouth daily. , Disp: , Rfl:  .  zolpidem (AMBIEN) 10 MG tablet, Take 1 tablet (10 mg total) by mouth at bedtime as needed., Disp: 30 tablet, Rfl: 3 No current facility-administered medications for this visit.   Facility-Administered Medications Ordered in Other Visits:  .  0.9 %  sodium chloride infusion, , Intravenous, PRN, Earlie Server, MD, Stopped at 04/27/17 1400 .  heparin lock flush 100 unit/mL, 500 Units, Intravenous, Once, Earlie Server, MD  Physical exam:  Vitals:   07/07/17 1403 07/07/17  1439  BP:  134/84  Pulse:  (!) 111  Resp:  16  Temp:  98.8 F (37.1 C)  TempSrc:  Tympanic  Weight:  149 lb (67.6 kg)    Physical Exam  Constitutional: She is oriented to person, place, and time and well-developed, well-nourished, and in no distress. Vital signs are normal.  HENT:  Head: Normocephalic and atraumatic.  Eyes: Pupils are equal, round, and reactive to light.  Neck: Normal range of motion.  Cardiovascular: Normal rate, regular rhythm and normal heart sounds.  No murmur heard. Pulmonary/Chest: Effort normal and breath sounds normal. She has no wheezes.  Abdominal: Soft. Normal appearance and bowel sounds are normal. She exhibits no distension. There is tenderness in the left lower quadrant. There is no rigidity, no rebound and no guarding.    Musculoskeletal: Normal range of motion. She exhibits no edema.  Neurological: She is alert and oriented to person, place, and time. Gait normal.  Skin: Skin is warm and dry. No rash noted.  Psychiatric: Mood, memory, affect and judgment normal.     CMP Latest Ref Rng & Units 07/07/2017  Glucose 65 - 99 mg/dL 126(H)  BUN 6 - 20 mg/dL 17  Creatinine 0.44 - 1.00 mg/dL 0.58  Sodium 135 - 145 mmol/L 134(L)  Potassium 3.5 - 5.1 mmol/L 3.2(L)  Chloride 101 - 111 mmol/L 100(L)  CO2 22 - 32 mmol/L 24  Calcium 8.9 - 10.3 mg/dL 8.9  Total Protein 6.5 - 8.1 g/dL 7.4  Total Bilirubin 0.3 - 1.2 mg/dL 0.6  Alkaline Phos 38 - 126 U/L 283(H)  AST 15 - 41 U/L 38  ALT 14 - 54 U/L 35   CBC Latest Ref Rng & Units 07/07/2017  WBC 3.6 - 11.0 K/uL 11.1(H)  Hemoglobin 12.0 - 16.0 g/dL 10.5(L)  Hematocrit 35.0 - 47.0 % 32.4(L)  Platelets 150 - 440 K/uL 230    No images are attached to the encounter.  No results found.   Assessment and plan- Patient is a 77 y.o. female who presents for severe diarrhea, abdominal pain and nausea.   1. Diarrhea: Will give 1 liter of fluids, IV Zofran, IV Decadron and PO Lomotil. Prescription sent to pharmacy  for Lomotil 2.5 mg 4 times a day. Instructed her to take around the clock. Will rule out C. Difficile. Negative for C. Difficile. This is likely diverticulitis. CT scan from 06/07/2017 identified diverticulosis along the left colon. Although she denies fever, she complains of left lower abdominal pain relieved after having a bowel movement and mild leukocytosis on today's labs. According to up-to-date, criteria for a patient to be managed outpatient is if she remained afebrile, she is able to maintain adequate hydration with mild to moderate left lower abdominal pain. Treatment consists of oral antibiotics for 7-10 days. She should be brought back in 2-3 days after initiation of antibiotic and weekly thereafter until  complete resolution of symptoms. It is recommended to begin Cipro 500 mg every 12 hours plus Flagyl 500 mg every 8 hours for 7-10 days. Additionally, her electrolytes have suffered some with the amount of diarrhea she's been having. Will replace potassium with 20 meq daily. She has tolerated this well in the past. Magnesium is surprisingly okay at 1.9 today. Continue Slow-Mag as prescribed by Dr. Tasia Catchings. As for nausea, she was instructed to take ondansetron as prescribed.  Consulted with Dr. Tasia Catchings and she is in agreement with plan. Patient accepted appointment for Wednesday at 2 pm for lab re-check and NP evaluation.   Addendum: Spoke to patient on 07/08/2017 and symptoms have improved "some". Bowel movements have decreased slightly (From 20 yesterday to about 12 today). She continues  to have left lower abdominal pain that is less painful than yesterday. She continues to deny fevers. She is in agreement to come back on Wednesday for follow-up. New prescriptions reviewed. Side effects discussed. Patient does not have history of allergy to these medications. Since labs looked pretty good, no dose reduction is required for antibiotics.  Visit Diagnosis 1. Diarrhea, unspecified type   2.  Cholangiocarcinoma ([place])   3. Hypokalemia   4. Diverticulitis     Patient expressed understanding and was in agreement with this plan. She also understands that She can call clinic at any time with any questions, concerns, or complaints.   Greater than 50% was spent in counseling and coordination of care with this patient including but not limited to discussion of the relevant topics above (See A&P) including, but not limited to diagnosis and management of acute and chronic medical conditions.    Faythe Casa, AGNP-C [facility]- [phone] Pager- [phone] 07/08/2017 3:12 PM

## 2017-07-08 ENCOUNTER — Telehealth: Payer: Self-pay | Admitting: *Deleted

## 2017-07-08 MED ORDER — METRONIDAZOLE 500 MG PO TABS: 500.0000 mg | ORAL_TABLET | Freq: Three times a day (TID) | ORAL | 0 refills | Status: DC

## 2017-07-08 MED ORDER — CIPROFLOXACIN HCL 500 MG PO TABS: 500.0000 mg | ORAL_TABLET | Freq: Two times a day (BID) | ORAL | 0 refills | Status: DC

## 2017-07-08 NOTE — Telephone Encounter (Signed)
Patient called to get results of C diff test from yesterday. I informed her it was negative and then I asked her how the diarrhea is and she states no better, she had 6 "runny stools this morning and has had 2 this afternoon. She states that she is using the Lomotil as ordered.

## 2017-07-08 NOTE — Telephone Encounter (Signed)
I was about to call her.I am calling her in Cipro today. I will call her and let her know.

## 2017-07-11 DIAGNOSIS — E119 Type 2 diabetes mellitus without complications: Secondary | ICD-10-CM | POA: Diagnosis not present

## 2017-07-11 DIAGNOSIS — J849 Interstitial pulmonary disease, unspecified: Secondary | ICD-10-CM | POA: Diagnosis not present

## 2017-07-11 DIAGNOSIS — C249 Malignant neoplasm of biliary tract, unspecified: Secondary | ICD-10-CM | POA: Diagnosis not present

## 2017-07-11 DIAGNOSIS — L931 Subacute cutaneous lupus erythematosus: Secondary | ICD-10-CM | POA: Diagnosis not present

## 2017-07-12 ENCOUNTER — Ambulatory Visit: Payer: Self-pay | Admitting: Internal Medicine

## 2017-07-13 ENCOUNTER — Encounter: Payer: Self-pay | Admitting: Oncology

## 2017-07-13 ENCOUNTER — Inpatient Hospital Stay (HOSPITAL_BASED_OUTPATIENT_CLINIC_OR_DEPARTMENT_OTHER): Payer: PPO | Admitting: Oncology

## 2017-07-13 ENCOUNTER — Inpatient Hospital Stay: Payer: PPO

## 2017-07-13 VITALS — BP 124/74 | HR 97 | Temp 98.4°F | Resp 16 | Wt 157.0 lb

## 2017-07-13 DIAGNOSIS — C221 Intrahepatic bile duct carcinoma: Secondary | ICD-10-CM

## 2017-07-13 DIAGNOSIS — E876 Hypokalemia: Secondary | ICD-10-CM

## 2017-07-13 DIAGNOSIS — L932 Other local lupus erythematosus: Secondary | ICD-10-CM | POA: Diagnosis not present

## 2017-07-13 DIAGNOSIS — K5792 Diverticulitis of intestine, part unspecified, without perforation or abscess without bleeding: Secondary | ICD-10-CM

## 2017-07-13 LAB — COMPREHENSIVE METABOLIC PANEL
ALT: 38 U/L (ref 14–54)
AST: 51 U/L — ABNORMAL HIGH (ref 15–41)
Albumin: 3.1 g/dL — ABNORMAL LOW (ref 3.5–5.0)
Alkaline Phosphatase: 267 U/L — ABNORMAL HIGH (ref 38–126)
Anion gap: 9 (ref 5–15)
BUN: 11 mg/dL (ref 6–20)
CHLORIDE: 103 mmol/L (ref 101–111)
CO2: 27 mmol/L (ref 22–32)
CREATININE: 0.71 mg/dL (ref 0.44–1.00)
Calcium: 8.7 mg/dL — ABNORMAL LOW (ref 8.9–10.3)
GFR calc Af Amer: >60 mL/min (ref >60)
Glucose, Bld: 183 mg/dL — ABNORMAL HIGH (ref 65–99)
Potassium: 3.9 mmol/L (ref 3.5–5.1)
Sodium: 139 mmol/L (ref 135–145)
Total Bilirubin: 0.3 mg/dL (ref 0.3–1.2)
Total Protein: 6.9 g/dL (ref 6.5–8.1)

## 2017-07-13 LAB — CBC WITH DIFFERENTIAL/PLATELET
Basophils Absolute: 0 10*3/uL (ref 0–0.1)
Basophils Relative: 1 %
EOS ABS: 0.1 10*3/uL (ref 0–0.7)
EOS PCT: 2 %
HCT: 28.1 % — ABNORMAL LOW (ref 35.0–47.0)
Hemoglobin: 9.3 g/dL — ABNORMAL LOW (ref 12.0–16.0)
LYMPHS PCT: 7 %
Lymphs Abs: 0.5 10*3/uL — ABNORMAL LOW (ref 1.0–3.6)
MCH: 29.1 pg (ref 26.0–34.0)
MCHC: 33 g/dL (ref 32.0–36.0)
MCV: 88.2 fL (ref 80.0–100.0)
MONOS PCT: 12 %
Monocytes Absolute: 0.8 10*3/uL (ref 0.2–0.9)
Neutro Abs: 5.2 10*3/uL (ref 1.4–6.5)
Neutrophils Relative %: 78 %
PLATELETS: 215 10*3/uL (ref 150–440)
RBC: 3.19 MIL/uL — AB (ref 3.80–5.20)
RDW: 15.8 % — ABNORMAL HIGH (ref 11.5–14.5)
WBC: 6.6 10*3/uL (ref 3.6–11.0)

## 2017-07-13 LAB — MAGNESIUM: Magnesium: 1.6 mg/dL — ABNORMAL LOW (ref 1.7–2.4)

## 2017-07-13 MED ORDER — PREDNISONE 1 MG PO TABS: 5.0000 mg | ORAL_TABLET | Freq: Every day | ORAL | 0 refills | Status: DC

## 2017-07-13 MED ORDER — MAGNESIUM CHLORIDE 64 MG PO TBEC: 1.0000 | DELAYED_RELEASE_TABLET | Freq: Every day | ORAL | 2 refills | Status: AC

## 2017-07-13 NOTE — Progress Notes (Signed)
Symptom Management Consult note [facility]  [phone] [phone]  Patient Care Team: Idelle Crouch, MD as PCP - General (Internal Medicine)   Name of the patient: Regina Hood  [id number]  [birth year]   Date of visit: 07/26/17  Diagnosis-  cholangiocarcinoma  Chief complaint/ Reason for visit- Follow-up  Heme/Onc history: Patient recently seen and treated for diverticulitis after presenting with diarrhea on 07/07/2017.  She received 1 L of fluids, antiemetics and IV Decadron and PO Lomotil in symptom managment  C. difficile and GI panel was negative.  Recent CT scan from 06/07/2017 identified diverticulosis along the left colon.  She continued to remain afebrile.  She was prescribed oral Flagyl and Cipro.  She tolerated both well. She was told to come back for close evaluation and assess toleration of antibiotics.   Patient was seen last by primary oncologist Dr. Tasia Catchings on 06/19/2017 to possibly resume chemotherapy. She continued to have a rash that was itchy and had spread to her legs. Plan was to began chemotherapy with single agent Gemzar. She declined therapy and wished to have a few more weeks to think about resuming. She continued 5 mg prednisone for her fatigue, weakness and rash. Recently had follow-up with dermatology that confirmed her rash from punch biopsy to be lupus antibodies. She was started on Azathioprine 50 mg once a day. Rash has improve.  Interval history- Patient presents today for follow-up.  She is doing "much better".  Diarrhea has essentially resolved.  She is almost done with her antibiotics.  She denies any more nausea.  The pain in her left lower abdomen has completely resolved. Appetite is better. Weight is stable. She requests refills on Prednisone and Magnesium. Labs have improved. White count improved.   ECOG FS:0 - Asymptomatic  Review of systems- Review of Systems  Constitutional: Negative.  Negative for chills,  fever, malaise/fatigue and weight loss.  HENT: Negative for congestion and ear pain.   Eyes: Negative.  Negative for blurred vision and double vision.  Respiratory: Negative.  Negative for cough, sputum production and shortness of breath.   Cardiovascular: Negative.  Negative for chest pain, palpitations and leg swelling.  Gastrointestinal: Negative.  Negative for abdominal pain, constipation, diarrhea, nausea and vomiting.       Symptoms have resolved  Genitourinary: Negative for dysuria, frequency and urgency.  Musculoskeletal: Negative for back pain and falls.  Skin: Positive for itching and rash.       improving  Neurological: Negative.  Negative for weakness and headaches.  Endo/Heme/Allergies: Negative.  Does not bruise/bleed easily.  Psychiatric/Behavioral: Negative.  Negative for depression. The patient is not nervous/anxious and does not have insomnia.      Current treatment- Currently not on treatment   Allergies  Allergen Reactions  . Penicillins Nausea Only     Past Medical History:  Diagnosis Date  . Arthritis   . Cancer ([place])   . Cellulitis   . Collagen vascular disease ([place])   . COPD (chronic obstructive pulmonary disease) ([place])   . DDD (degenerative disc disease), lumbar   . Depression   . Diabetes mellitus   . Diverticulitis   . GERD (gastroesophageal reflux disease)   . GI bleed   . Headache(784.0)   . Hypertension   . Lupus ([place])   . Ulcerative colitis [facility])      Past Surgical History:  Procedure Laterality Date  . ABDOMINAL HYSTERECTOMY    . BREAST BIOPSY Right   .  COLONOSCOPY    . COLONOSCOPY WITH PROPOFOL N/A 08/18/2016   Procedure: COLONOSCOPY WITH PROPOFOL;  Surgeon: Manya Silvas, MD;  Location: [facility] ENDOSCOPY;  Service: Endoscopy;  Laterality: N/A;  . ESOPHAGOGASTRODUODENOSCOPY (EGD) WITH PROPOFOL N/A 08/18/2016   Procedure: ESOPHAGOGASTRODUODENOSCOPY (EGD) WITH PROPOFOL;  Surgeon: Manya Silvas, MD;  Location: [facility] ENDOSCOPY;  Service:  Endoscopy;  Laterality: N/A;  . IR BILIARY DRAIN PLACEMENT WITH CHOLANGIOGRAM  06/08/2017  . ORIF HUMERUS FRACTURE  09/17/2011   Procedure: OPEN REDUCTION INTERNAL FIXATION (ORIF) PROXIMAL HUMERUS FRACTURE;  Surgeon: Augustin Schooling, MD;  Location: [place];  Service: Orthopedics;  Laterality: Left;    Social History   Socioeconomic History  . Marital status: Widowed    Spouse name: Not on file  . Number of children: Not on file  . Years of education: Not on file  . Highest education level: Not on file  Occupational History  . Not on file  Social Needs  . Financial resource strain: Not on file  . Food insecurity:    Worry: Not on file    Inability: Not on file  . Transportation needs:    Medical: Not on file    Non-medical: Not on file  Tobacco Use  . Smoking status: Former Smoker    Packs/day: 1.00    Years: 10.00    Pack years: 10.00    Types: Cigarettes    Last attempt to quit: 12/30/1998    Years since quitting: 18.5  . Smokeless tobacco: Never Used  . Tobacco comment: quit smoking in 2003  Substance and Sexual Activity  . Alcohol use: No  . Drug use: No  . Sexual activity: Not on file  Lifestyle  . Physical activity:    Days per week: Not on file    Minutes per session: Not on file  . Stress: Not on file  Relationships  . Social connections:    Talks on phone: Not on file    Gets together: Not on file    Attends religious service: Not on file    Active member of club or organization: Not on file    Attends meetings of clubs or organizations: Not on file    Relationship status: Not on file  . Intimate partner violence:    Fear of current or ex partner: Not on file    Emotionally abused: Not on file    Physically abused: Not on file    Forced sexual activity: Not on file  Other Topics Concern  . Not on file  Social History Narrative  . Not on file    Family History  Problem Relation Age of Onset  . Breast cancer Mother 49  . COPD Father   . Prostate  cancer Brother      Current Outpatient Medications:  .  acetaminophen (TYLENOL) 500 MG tablet, Take 1,000 mg by mouth 3 (three) times daily., Disp: , Rfl:  .  amLODipine (NORVASC) 10 MG tablet, Take 1 tablet (10 mg total) by mouth daily., Disp: 30 tablet, Rfl: 0 .  calcium carbonate 100 mg/ml SUSP, Take by mouth daily. , Disp: , Rfl:  .  Cyanocobalamin (VITAMIN B-12 PO), Take 1,000 mcg by mouth daily., Disp: , Rfl:  .  diphenoxylate-atropine (LOMOTIL) 2.5-0.025 MG tablet, Take 2 tablets by mouth 4 (four) times daily as needed for diarrhea or loose stools., Disp: 60 tablet, Rfl: 0 .  gabapentin (NEURONTIN) 100 MG capsule, Take 100 mg by mouth 2 (two) times daily. , Disp: ,  Rfl:  .  hydroxychloroquine (PLAQUENIL) 200 MG tablet, Take 200 mg by mouth 2 (two) times daily. , Disp: , Rfl:  .  lidocaine-prilocaine (EMLA) cream, Apply 1 application topically as needed., Disp: 30 g, Rfl: 3 .  LORazepam (ATIVAN) 0.5 MG tablet, Take 0.5 mg every 6 (six) hours as needed by mouth., Disp: , Rfl: 0 .  magnesium chloride (SLOW-MAG) 64 MG TBEC SR tablet, Take 1 tablet (64 mg total) by mouth daily., Disp: 30 tablet, Rfl: 2 .  metFORMIN (GLUCOPHAGE) 500 MG tablet, Take 500 mg by mouth 2 (two) times daily with a meal., Disp: , Rfl:  .  metoprolol tartrate (LOPRESSOR) 25 MG tablet, Take 1 tablet (25 mg total) by mouth 2 (two) times daily., Disp: 60 tablet, Rfl: 0 .  ondansetron (ZOFRAN) 8 MG tablet, TAKE ONE TABLET EVERY 12 HOURS AS NEEDED, Disp: 30 tablet, Rfl: 3 .  oxyCODONE (OXY IR/ROXICODONE) 5 MG immediate release tablet, Take 1 tablet (5 mg total) by mouth every 6 (six) hours as needed., Disp: 120 tablet, Rfl: 0 .  pantoprazole (PROTONIX) 40 MG tablet, Take 1 tablet (40 mg total) by mouth daily., Disp: 30 tablet, Rfl: 3 .  potassium chloride SA (K-DUR,KLOR-CON) 20 MEQ tablet, Take 1 tablet (20 mEq total) by mouth daily., Disp: 30 tablet, Rfl: 0 .  predniSONE (DELTASONE) 1 MG tablet, Take 5 tablets (5 mg total)  by mouth daily with breakfast., Disp: 90 tablet, Rfl: 0 .  prochlorperazine (COMPAZINE) 10 MG tablet, Take 1 tablet (10 mg total) by mouth every 6 (six) hours as needed., Disp: 30 tablet, Rfl: 3 .  senna (SENOKOT) 8.6 MG TABS tablet, Take 2 tablets (17.2 mg total) by mouth daily. Hold for loose stools., Disp: 120 each, Rfl: 3 .  sodium chloride 0.9 % injection, Flush each biliary drains (2) with 10 mL two times daily., Disp: , Rfl:  .  Sodium Chloride Flush (NORMAL SALINE FLUSH) 0.9 % SOLN, 1 mL by Other route every 8 (eight) hours. Flush each biliary drain with 1 mL twice daily, Disp: 84 Syringe, Rfl: 3 .  venlafaxine XR (EFFEXOR-XR) 75 MG 24 hr capsule, Take 225 mg by mouth daily. , Disp: , Rfl:  .  Fluticasone-Salmeterol (ADVAIR DISKUS) 100-50 MCG/DOSE AEPB, Inhale 1 puff into the lungs 2 (two) times daily., Disp: 60 each, Rfl: 3 .  zolpidem (AMBIEN) 10 MG tablet, Take 1 tablet (10 mg total) by mouth at bedtime as needed., Disp: 30 tablet, Rfl: 3 No current facility-administered medications for this visit.   Facility-Administered Medications Ordered in Other Visits:  .  0.9 %  sodium chloride infusion, , Intravenous, PRN, Earlie Server, MD, Stopped at 04/27/17 1400 .  heparin lock flush 100 unit/mL, 500 Units, Intravenous, Once, Earlie Server, MD  Physical exam:  Vitals:   07/13/17 1444  BP: 124/74  Pulse: 97  Resp: 16  Temp: 98.4 F (36.9 C)  TempSrc: Tympanic  Weight: 157 lb (71.2 kg)   Physical Exam  Constitutional: She is oriented to person, place, and time and well-developed, well-nourished, and in no distress. Vital signs are normal.  HENT:  Head: Normocephalic and atraumatic.  Eyes: Pupils are equal, round, and reactive to light.  Neck: Normal range of motion.  Cardiovascular: Normal rate, regular rhythm and normal heart sounds.  No murmur heard. Pulmonary/Chest: Effort normal and breath sounds normal. She has no wheezes.  Abdominal: Soft. Normal appearance and bowel sounds are  normal. She exhibits no distension. There is no tenderness.  Musculoskeletal: Normal range of motion. She exhibits no edema.  Neurological: She is alert and oriented to person, place, and time. Gait normal.  Skin: Skin is warm and dry. Rash (Diffuse erythematous rash with scale forearms, back and chest.) noted.  Psychiatric: Mood, memory, affect and judgment normal.     CMP Latest Ref Rng & Units 07/19/2017  Glucose 65 - 99 mg/dL 158(H)  BUN 6 - 20 mg/dL 13  Creatinine 0.44 - 1.00 mg/dL 0.65  Sodium 135 - 145 mmol/L 137  Potassium 3.5 - 5.1 mmol/L 4.1  Chloride 101 - 111 mmol/L 103  CO2 22 - 32 mmol/L 26  Calcium 8.9 - 10.3 mg/dL 8.8(L)  Total Protein 6.5 - 8.1 g/dL 7.3  Total Bilirubin 0.3 - 1.2 mg/dL 0.4  Alkaline Phos 38 - 126 U/L 251(H)  AST 15 - 41 U/L 52(H)  ALT 14 - 54 U/L 29   CBC Latest Ref Rng & Units 07/19/2017  WBC 3.6 - 11.0 K/uL 7.9  Hemoglobin 12.0 - 16.0 g/dL 9.4(L)  Hematocrit 35.0 - 47.0 % 28.7(L)  Platelets 150 - 440 K/uL 226    No images are attached to the encounter.  No results found.   Assessment and plan- Patient is a 77 y.o. female he presents for follow-up after beginning antibiotics for diverticulitis.  1. Cholangiocarcinoma: Patient is to follow up with Dr. Tasia Catchings on 07/19/17 for possible initiation of single agent Gemzar.   2. Subacute cutaneous lupus: Patient followed by dermatologist Dr. Jefm Bryant. Patient is to continue her prednisone, Plaquenil and clobetasol. He has added Azathioprine 50 mg daily. Rash improving.  3. Diarrhea/ Presumed Diverticulitis: Resolved. Patient remained afebrile. Tolerated oral antibiotics. Mild nausea with Flagyl. Abdominal pain has resolved. CT of the abdomen was not completed for confirmatory purposes due to recent imaging, but was discussed with primary oncologist who was agreeable with plan of care. Up-to-date recommended patient be brought back to clinic within 2-3 days after initiation of antibiotic to assess tolerability  until resolution of symptoms. Patient's symptoms have resolved. Patient knows to contact clinic with any questions/concerns or if symptoms return.   Visit Diagnosis 1. Diverticulitis   2. Cholangiocarcinoma [facility])     Patient expressed understanding and was in agreement with this plan. She also understands that She can call clinic at any time with any questions, concerns, or complaints.   Greater than 50% was spent in counseling and coordination of care with this patient including but not limited to discussion of the relevant topics above (See A&P) including, but not limited to diagnosis and management of acute and chronic medical conditions.    Faythe Casa, AGNP-C [facility]- [phone] Pager- [phone] 07/26/2017 9:38 AM

## 2017-07-18 ENCOUNTER — Encounter: Payer: Self-pay | Admitting: Internal Medicine

## 2017-07-18 ENCOUNTER — Other Ambulatory Visit: Payer: Self-pay | Admitting: *Deleted

## 2017-07-18 ENCOUNTER — Ambulatory Visit: Payer: PPO | Admitting: Internal Medicine

## 2017-07-18 VITALS — BP 130/70 | HR 101 | Ht 62.0 in | Wt 156.0 lb

## 2017-07-18 DIAGNOSIS — J449 Chronic obstructive pulmonary disease, unspecified: Secondary | ICD-10-CM

## 2017-07-18 DIAGNOSIS — D869 Sarcoidosis, unspecified: Secondary | ICD-10-CM | POA: Diagnosis not present

## 2017-07-18 DIAGNOSIS — J9611 Chronic respiratory failure with hypoxia: Secondary | ICD-10-CM

## 2017-07-18 DIAGNOSIS — J84112 Idiopathic pulmonary fibrosis: Secondary | ICD-10-CM

## 2017-07-18 MED ORDER — FLUTICASONE-SALMETEROL 100-50 MCG/DOSE IN AEPB: 1.0000 | INHALATION_SPRAY | Freq: Two times a day (BID) | RESPIRATORY_TRACT | 3 refills | Status: AC

## 2017-07-18 NOTE — Progress Notes (Signed)
[place] [facility] Consultation      Date: 07/18/2017,   MRN# [id number] Regina Hood 11-08-1940     Admission                  Current  Regina Hood is a 77 y.o. old female seen in consultation for COPD at the request of patient     CHIEF COMPLAINT:   Follow up SOB   HISTORY OF PRESENT ILLNESS   77 yo female seen today for assessment of her lung problems and COPD/Pulmonary fibrosis After reviewing her chart  1.she has sarcoidosis dx in 1970 with OLBX 2.she has dx of COPD-for many years on advair for 2 months 3.she has ILD with fibrosis-from Sarcoidosis  Recently dx of Liver cancer +dx of LUPUS  Has chronic stable SOB and DOE Uses oxygen as needed  No sign of infection at this time No sign of CHF  No resp distress  She understands her lung problems now. SHer he sta  She states t    REVIEW OF SYSTEMS   Review of Systems  Constitutional: Negative for chills, diaphoresis, fever, malaise/fatigue and weight loss.  HENT: Negative for congestion and hearing loss.   Eyes: Negative for blurred vision and double vision.  Respiratory: Positive for shortness of breath and wheezing. Negative for cough, hemoptysis and sputum production.   Cardiovascular: Negative for chest pain, palpitations and orthopnea.  Gastrointestinal: Negative for abdominal pain, heartburn, nausea and vomiting.  Genitourinary: Negative for dysuria and urgency.  Musculoskeletal: Negative for back pain, myalgias and neck pain.  Skin: Negative for rash.  Neurological: Negative for dizziness and weakness.  Endo/Heme/Allergies: Does not bruise/bleed easily.  Psychiatric/Behavioral: Negative for depression.  All other systems reviewed and are negative.    VS: Ht 5' 2"  (1.575 m)   BMI 28.72 kg/m      PHYSICAL EXAM  Physical Exam  Constitutional: She is oriented to person, place, and time. She appears well-developed and well-nourished. No distress.  HENT:  Head: Normocephalic and  atraumatic.  Mouth/Throat: No oropharyngeal exudate.  Eyes: Pupils are equal, round, and reactive to light. EOM are normal. No scleral icterus.  Neck: Normal range of motion. Neck supple.  Cardiovascular: Normal rate, regular rhythm and normal heart sounds.  No murmur heard. Pulmonary/Chest: No stridor. No respiratory distress. She has no wheezes. She has rales.  Abdominal: Soft. Bowel sounds are normal.  Musculoskeletal: Normal range of motion. She exhibits no edema.  Neurological: She is alert and oriented to person, place, and time. No cranial nerve deficit.  Skin: Rash noted. She is not diaphoretic. There is erythema.  Patchy dry scaly erythematous rash arms/legs  Psychiatric: She has a normal mood and affect.      IMAGING   CT chest  bibasilar fibrosis 11/18  +GGO upper lobes  6MWT in 03/2016 reports Hypoxia with exertion at 330M with o2 sat 85%  ^MWT at other Office shows +  ASSESSMENT/PLAN   77 yo pleasant white female with chronic Resp failure on oxygen secondary to Gold Stage A COPD and Staeg 4 Pulm Fibrosis from sarcoidosis. I have explained to patient her diagnosis and that she will need oxygen to survive- With underlying Liver Cancer and Lupus  She understands her lung disease and prognosis.  1.continue Advair  as prescribed 2. prescribed oxygen at night and with exertion-benefits from oxygen therapy Need to re-assess oxygen needs with 6MWT 3.liver cancer-follow up Dr Tasia Catchings 4.Lupus-follow up Rheum otology   Follow up in  6 months   Patient satisfied with Plan of action and management. All questions answered  Corrin Parker, M.D.  [facility]  Medical Director [place] Director [facility] Department

## 2017-07-18 NOTE — Patient Instructions (Signed)
CONTINUE OXYGEN AS PRESCRIBED CONTINUE ADVAIR AS PRESCRIBED ALBUTEROL AS NEEDED

## 2017-07-19 ENCOUNTER — Inpatient Hospital Stay: Payer: PPO | Attending: Oncology

## 2017-07-19 ENCOUNTER — Encounter: Payer: Self-pay | Admitting: Oncology

## 2017-07-19 ENCOUNTER — Inpatient Hospital Stay: Payer: PPO

## 2017-07-19 ENCOUNTER — Inpatient Hospital Stay (HOSPITAL_BASED_OUTPATIENT_CLINIC_OR_DEPARTMENT_OTHER): Payer: PPO | Admitting: Oncology

## 2017-07-19 ENCOUNTER — Other Ambulatory Visit: Payer: Self-pay

## 2017-07-19 VITALS — BP 126/79 | HR 97 | Temp 97.3°F | Wt 155.6 lb

## 2017-07-19 DIAGNOSIS — R197 Diarrhea, unspecified: Secondary | ICD-10-CM | POA: Insufficient documentation

## 2017-07-19 DIAGNOSIS — C221 Intrahepatic bile duct carcinoma: Secondary | ICD-10-CM

## 2017-07-19 DIAGNOSIS — D649 Anemia, unspecified: Secondary | ICD-10-CM

## 2017-07-19 DIAGNOSIS — K5792 Diverticulitis of intestine, part unspecified, without perforation or abscess without bleeding: Secondary | ICD-10-CM

## 2017-07-19 DIAGNOSIS — R531 Weakness: Secondary | ICD-10-CM | POA: Diagnosis not present

## 2017-07-19 DIAGNOSIS — R1084 Generalized abdominal pain: Secondary | ICD-10-CM | POA: Insufficient documentation

## 2017-07-19 DIAGNOSIS — L932 Other local lupus erythematosus: Secondary | ICD-10-CM | POA: Diagnosis not present

## 2017-07-19 DIAGNOSIS — Z5111 Encounter for antineoplastic chemotherapy: Secondary | ICD-10-CM

## 2017-07-19 DIAGNOSIS — R5383 Other fatigue: Secondary | ICD-10-CM

## 2017-07-19 DIAGNOSIS — C801 Malignant (primary) neoplasm, unspecified: Secondary | ICD-10-CM

## 2017-07-19 DIAGNOSIS — E876 Hypokalemia: Secondary | ICD-10-CM

## 2017-07-19 LAB — COMPREHENSIVE METABOLIC PANEL
ALBUMIN: 3.3 g/dL — AB (ref 3.5–5.0)
ALT: 29 U/L (ref 14–54)
ANION GAP: 8 (ref 5–15)
AST: 52 U/L — ABNORMAL HIGH (ref 15–41)
Alkaline Phosphatase: 251 U/L — ABNORMAL HIGH (ref 38–126)
BUN: 13 mg/dL (ref 6–20)
CO2: 26 mmol/L (ref 22–32)
Calcium: 8.8 mg/dL — ABNORMAL LOW (ref 8.9–10.3)
Chloride: 103 mmol/L (ref 101–111)
Creatinine, Ser: 0.65 mg/dL (ref 0.44–1.00)
GFR calc Af Amer: >60 mL/min (ref >60)
GFR calc non Af Amer: >60 mL/min (ref >60)
GLUCOSE: 158 mg/dL — AB (ref 65–99)
POTASSIUM: 4.1 mmol/L (ref 3.5–5.1)
SODIUM: 137 mmol/L (ref 135–145)
Total Bilirubin: 0.4 mg/dL (ref 0.3–1.2)
Total Protein: 7.3 g/dL (ref 6.5–8.1)

## 2017-07-19 LAB — CBC WITH DIFFERENTIAL/PLATELET
Basophils Absolute: 0 K/uL (ref 0–0.1)
Basophils Relative: 1 %
Eosinophils Absolute: 0.2 K/uL (ref 0–0.7)
Eosinophils Relative: 3 %
HCT: 28.7 % — ABNORMAL LOW (ref 35.0–47.0)
Hemoglobin: 9.4 g/dL — ABNORMAL LOW (ref 12.0–16.0)
Lymphocytes Relative: 7 %
Lymphs Abs: 0.5 K/uL — ABNORMAL LOW (ref 1.0–3.6)
MCH: 28.5 pg (ref 26.0–34.0)
MCHC: 32.6 g/dL (ref 32.0–36.0)
MCV: 87.5 fL (ref 80.0–100.0)
Monocytes Absolute: 0.9 K/uL (ref 0.2–0.9)
Monocytes Relative: 11 %
Neutro Abs: 6.2 K/uL (ref 1.4–6.5)
Neutrophils Relative %: 78 %
Platelets: 226 K/uL (ref 150–440)
RBC: 3.28 MIL/uL — ABNORMAL LOW (ref 3.80–5.20)
RDW: 15.4 % — ABNORMAL HIGH (ref 11.5–14.5)
WBC: 7.9 K/uL (ref 3.6–11.0)

## 2017-07-19 LAB — MAGNESIUM: Magnesium: 1.7 mg/dL (ref 1.7–2.4)

## 2017-07-19 MED ORDER — HEPARIN SOD (PORK) LOCK FLUSH 100 UNIT/ML IV SOLN
INTRAVENOUS | Status: AC
  Filled 2017-07-19: qty 5

## 2017-07-19 MED ORDER — HEPARIN SOD (PORK) LOCK FLUSH 100 UNIT/ML IV SOLN
500.0000 [IU] | Freq: Once | INTRAVENOUS | Status: AC
  Administered 2017-07-19: 500 [IU] via INTRAVENOUS

## 2017-07-19 NOTE — Progress Notes (Signed)
Patient here today for follow up.   

## 2017-07-19 NOTE — Progress Notes (Signed)
Hematology/Oncology Follow up note [facility] [phone] [phone]  Patient Care Team: Idelle Crouch, MD as PCP - General (Internal Medicine) REASON FOR VISIT Follow up for treatment of cholangiocarcinoma  PERTINENT ONCOLOGY HISTORY Regina Hood 77 y.o.  female with PMH listed as below, most significantly for cholangiocarcinoma who has been on chemotherapy treatment presents to transfer her cancer care to our cancer center.  After extensive medical record review, summary of her diagnosis and treatment history are listed below.  1. Initially presented in 07/2016 to an outside ED with lower and upper GI bleed.  A. She was found to have new LFTs elevations including AST 340, ALT 379, Alk phos 420 and t bili 0.9.   B. [place] was obtained, which did not visualize the left lobe.  C. She was evaluated by gastroenterology because of her history of UC and underwent colonoscopy, which was unremarkable per path report.  2. 08/20/16, CT noted diffuse atrophy of the left lobe with left intrahepatic biliary ductal dilatation, with 4.1 cm ill-defined low-attenuation mass. This mass was suspicious for malignancy.  3. 08/2016, MRI/MRCP which showed abnormal left hepatic lobe with diffuse biliary dilatation with a shrunken left hepatic lobe and a 2.42.9 cm oval-shaped masslike component displacing the adjacent bile ducts. There is an approximately 1.6 cm segment truncated bile ducts involving common hepatic duct and right and left hepatic although no dilatation of the right hepatic duct system was seen.   4. 09/02/16, ERCP showed localized biliary stricture in the left intrahepatic duct with dilation of the left intrahepatic branches. Sphincterotomy was performed and a stent was placed. EUS with a 3.3 cm mass and left duct dilation.  5. Repeat ERCP was done 09/10/16, during which the stent was exchanged.  6. 09/20/16, taken to OR and noted to a have an peritoneal  implant with biopsy c/w adenocarcinoma. The neoplastic cells are positive for CK7. They are negative for CK20, CDX-2, TTF-1, Napsin A, hep-par1, glypican 3, and arginase. The immunohistochemical features are consistent with adenocarcinoma of upper gastrointestinal tract or pancreaticobiliary origin.  7. 09/28/16, initially seen in medical oncology clinic by Dr. Filomena Jungling 8. 09/29/16, admitted for biliary obstruction and cholangitis, s/p PBD placement, d/c 10/06/16 9. 10/12/16, initiated on gemcitabine (400 mg/m2). CA 19-9 167 10. 10/26/16, increased gemcitabine (800 mg/m2) 11. 11/23/2016 Cycle 3 Gemzar (872m/m2), 12/07/2016, Gemzar (8015mm2) added cisplatin 2564m2  12. 12/14/2016, CT CAP shows slight interval decrease in size of L hepatic lobe cholangio. Slight interval increase in size of porta hepatis lymph node whiel other nodes are slightly smaller. Multiple bilateral thyroid nodules. Stable mediastinal lymph nodes. CA 19-9 60 14  8/30 Cycle 4 Gemzar (800m48m) added cisplatin 20mg67m 15, 12/28/2016 Gemcitabine 800mg/42mcisplatin was hold due to anemia. S/p 1 PRBC transfusion.   [facility]   [facility] care to [facility] [place]    She forgot her appointment on day 8 and Gemcitabine 800mg/m60md cisplatin 20mg/m240m given  Day 9 of cycle 4 (01/05/2017).        01/19/2017  Cycle 5 Day 1Gemcitabine 800mg/m2,58mplatin 20mg/m2, 62munit blood transfusion.        01/26/2017 Cycle 5 Day 8 Gemcitabine 800mg/m2, c60matin 20mg/m2    60mShe can not tolerate Gemcitabine and Cisplatin due to cytopenia and profound weakness. She was continued on Gemcitabine        single agent. Last chemotherapy was 12/19.  16 She has been chronically on dexamethasone since she started her treatment in [place]. She was originally on 4 mg of dexamethasone when she transition her care to me. I have weaned down to 1 mg, however her skin rash got worse. After recent hospitalization at [facility], the 23m Dexamethasone was  discontinued after discharge. Due to profound weakness and the abrupt stop of Dexamethasone, I start patient on 534mprednisone on 05/16/2017.    17 She [facility] She spiked fever one week later and had hyperbilirubinemia and was treated for possible infection. Her blood culture was negative for multiple times.     06 May 2017, Patient's PPD has been converted to externally drained [facility] enough [facility] and also abdominal pain.  She called [facility] and was instructed to have labs done locally and if no rising of bilirubin level, no intervention was needed.  At my office, she has had labs done which reviewed normal bilirubin level.  She appears dehydrated and she received a hydration session at out infusion center. .HMarland Kitchenr biliary drains have about 10-20 cc/day output, draining well since she was instructed to flush it. # [facility]   INTERVAL HISTORY Regina STAVOLA89.o.  female with cholangiocarcinoma follows up after hospital discharge. S/p PBD exchange.  Patient has persistent skin rash despite treated with steroids.  During the interval she was referred back to see her rheumatologist and also establish care with dermatologist.  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  Patient also was seen by symptom management Nurse practitioner a week ago due to diarrhea, left lower quadrant pain which considered to be diverticulitis.  Patient was started on Cipro and Flagyl and she has completed about 7 days and to stop taking.  Patient feels nauseated while taking Flagyl.  Her diarrhea has resolved and left lower quadrant pain has resolved as well. She feels tired today and not mentally ready to restart chemotherapy. Patient's rheumatologist Dr. KeJefm Bryantas planned to start patient add azathioprine in  combination with plaquenil.  Biologics may be considered if not well controlled with current regimen. Patient continues to take prednisone 76m2maily.      Review of Systems  Constitutional: Positive for fatigue. Negative for appetite change, chills and diaphoresis.  HENT:   Negative for hearing loss, mouth sores, nosebleeds and sore throat.   Eyes: Negative for eye problems and icterus.  Respiratory: Negative for cough, hemoptysis and shortness of breath.        Use oxygen as needed at home, has not need to use lately.  Cardiovascular: Negative for chest pain and leg swelling.  Gastrointestinal: Negative for abdominal pain, blood in stool, constipation and diarrhea.  Endocrine: Negative for hot flashes.  Genitourinary: Negative for difficulty urinating, dysuria, frequency and nocturia.   Musculoskeletal: Negative for arthralgias, back pain, flank pain and myalgias.  Skin: Positive for itching and rash.        rash on back improves, with a few area of back developing papular rash. Rrash on her arms persist. Now also have papular erythematous rash on her thighs  Neurological: Negative for dizziness, headaches, numbness and seizures.  Hematological: Negative for adenopathy.  Psychiatric/Behavioral: Negative for confusion and depression. The patient is not nervous/anxious.    MEDICAL HISTORY:  Past Medical History:  Diagnosis Date  . Arthritis   . Cancer ([place])   . Cellulitis   . Collagen vascular disease ([place])   . COPD (chronic obstructive pulmonary disease) ([place])   . DDD (degenerative disc disease), lumbar   . Depression   . Diabetes mellitus   . Diverticulitis   . GERD (gastroesophageal reflux disease)   . GI bleed   . Headache(784.0)   . Hypertension   . Lupus ([place])   . Ulcerative colitis ([place])     SURGICAL HISTORY: Past Surgical History:  Procedure Laterality Date  . ABDOMINAL HYSTERECTOMY    . BREAST BIOPSY Right   . COLONOSCOPY    . COLONOSCOPY WITH PROPOFOL N/A  08/18/2016   Procedure: COLONOSCOPY WITH PROPOFOL;  Surgeon: Manya Silvas, MD;  [facility];  Service: Endoscopy;  Laterality: N/A;  . ESOPHAGOGASTRODUODENOSCOPY (EGD) WITH PROPOFOL N/A 08/18/2016   Procedure: ESOPHAGOGASTRODUODENOSCOPY (EGD) WITH PROPOFOL;  Surgeon: Manya Silvas, MD;  Location: [facility] ENDOSCOPY;  Service: Endoscopy;  Laterality: N/A;  . IR BILIARY DRAIN PLACEMENT WITH CHOLANGIOGRAM  06/08/2017  . ORIF HUMERUS FRACTURE  09/17/2011   Procedure: OPEN REDUCTION INTERNAL FIXATION (ORIF) PROXIMAL HUMERUS FRACTURE;  Surgeon: Augustin Schooling, MD;  Location: [place];  Service: Orthopedics;  Laterality: Left;    SOCIAL HISTORY: Social History   Socioeconomic History  . Marital status: Widowed    Spouse name: Not on file  . Number of children: Not on file  . Years of education: Not on file  . Highest education level: Not on file  Occupational History  . Not on file  Social Needs  . Financial resource strain: Not on file  . Food insecurity:    Worry: Not on file    Inability: Not on file  . Transportation needs:    Medical: Not on file    Non-medical: Not on file  Tobacco Use  . Smoking status: Former Smoker    Packs/day: 1.00    Years: 10.00    Pack years: 10.00    Types: Cigarettes    Last attempt to quit: 12/30/1998    Years since quitting: 18.5  . Smokeless tobacco: Never Used  . Tobacco comment: quit smoking in 2003  Substance and Sexual Activity  . Alcohol use: No  . Drug use: No  . Sexual activity: Not on file  Lifestyle  . Physical activity:    Days per week: Not on file    Minutes per session: Not on file  . Stress: Not on file  Relationships  . Social connections:    Talks on phone: Not on file    Gets together: Not on file    Attends religious service: Not on file    Active member of club or organization: Not on file    Attends meetings of clubs or organizations: Not on file    Relationship status: Not on file  . Intimate partner  violence:    Fear of current or ex partner: Not on file    Emotionally abused: Not on file    Physically abused: Not on file    Forced sexual activity: Not on file  Other Topics Concern  . Not on file  Social History Narrative  . Not on file    FAMILY HISTORY: Family History  Problem Relation Age of Onset  . Breast cancer Mother 35  . COPD Father   . Prostate cancer Brother     ALLERGIES:  is allergic to penicillins.  MEDICATIONS:  Current Outpatient  Medications  Medication Sig Dispense Refill  . acetaminophen (TYLENOL) 500 MG tablet Take 1,000 mg by mouth 3 (three) times daily.    Marland Kitchen amLODipine (NORVASC) 10 MG tablet Take 1 tablet (10 mg total) by mouth daily. 30 tablet 0  . calcium carbonate 100 mg/ml SUSP Take by mouth daily.     . Cyanocobalamin (VITAMIN B-12 PO) Take 1,000 mcg by mouth daily.    . diphenoxylate-atropine (LOMOTIL) 2.5-0.025 MG tablet Take 2 tablets by mouth 4 (four) times daily as needed for diarrhea or loose stools. 60 tablet 0  . Fluticasone-Salmeterol (ADVAIR DISKUS) 100-50 MCG/DOSE AEPB Inhale 1 puff into the lungs 2 (two) times daily. 60 each 3  . gabapentin (NEURONTIN) 100 MG capsule Take 100 mg by mouth 2 (two) times daily.     . hydroxychloroquine (PLAQUENIL) 200 MG tablet Take 200 mg by mouth 2 (two) times daily.     Marland Kitchen lidocaine-prilocaine (EMLA) cream Apply 1 application topically as needed. 30 g 3  . LORazepam (ATIVAN) 0.5 MG tablet Take 0.5 mg every 6 (six) hours as needed by mouth.  0  . magnesium chloride (SLOW-MAG) 64 MG TBEC SR tablet Take 1 tablet (64 mg total) by mouth daily. 30 tablet 2  . metFORMIN (GLUCOPHAGE) 500 MG tablet Take 500 mg by mouth 2 (two) times daily with a meal.    . metoprolol tartrate (LOPRESSOR) 25 MG tablet Take 1 tablet (25 mg total) by mouth 2 (two) times daily. 60 tablet 0  . ondansetron (ZOFRAN) 8 MG tablet TAKE ONE TABLET EVERY 12 HOURS AS NEEDED 30 tablet 3  . oxyCODONE (OXY IR/ROXICODONE) 5 MG immediate release  tablet Take 1 tablet (5 mg total) by mouth every 6 (six) hours as needed. 120 tablet 0  . pantoprazole (PROTONIX) 40 MG tablet Take 1 tablet (40 mg total) by mouth daily. 30 tablet 3  . potassium chloride SA (K-DUR,KLOR-CON) 20 MEQ tablet Take 1 tablet (20 mEq total) by mouth daily. 30 tablet 0  . predniSONE (DELTASONE) 1 MG tablet Take 5 tablets (5 mg total) by mouth daily with breakfast. 90 tablet 0  . prochlorperazine (COMPAZINE) 10 MG tablet Take 1 tablet (10 mg total) by mouth every 6 (six) hours as needed. 30 tablet 3  . senna (SENOKOT) 8.6 MG TABS tablet Take 2 tablets (17.2 mg total) by mouth daily. Hold for loose stools. 120 each 3  . sodium chloride 0.9 % injection Flush each biliary drains (2) with 10 mL two times daily.    . Sodium Chloride Flush (NORMAL SALINE FLUSH) 0.9 % SOLN 1 mL by Other route every 8 (eight) hours. Flush each biliary drain with 1 mL twice daily 84 Syringe 3  . venlafaxine XR (EFFEXOR-XR) 75 MG 24 hr capsule Take 225 mg by mouth daily.     Marland Kitchen zolpidem (AMBIEN) 10 MG tablet Take 1 tablet (10 mg total) by mouth at bedtime as needed. 30 tablet 3   No current facility-administered medications for this visit.    Facility-Administered Medications Ordered in Other Visits  Medication Dose Route Frequency Provider Last Rate Last Dose  . 0.9 %  sodium chloride infusion   Intravenous PRN Earlie Server, MD   Stopped at 04/27/17 1400  . heparin lock flush 100 unit/mL  500 Units Intravenous Once Earlie Server, MD          .  PHYSICAL EXAMINATION: ECOG PERFORMANCE STATUS: 2 - Symptomatic, <50% confined to bed Vitals:   07/19/17 1121  BP: 126/79  Pulse: 97  Temp: (!) 97.3 F (36.3 C)   Filed Weights   07/19/17 1121  Weight: 155 lb 9 oz (70.6 kg)   Physical Exam  Constitutional: She is oriented to person, place, and time and well-developed, well-nourished, and in no distress. No distress.  HENT:  Head: Normocephalic and atraumatic.  Mouth/Throat: Oropharynx is clear and  moist. No oropharyngeal exudate.  Eyes: Pupils are equal, round, and reactive to light. EOM are normal. No scleral icterus.  Neck: Normal range of motion. Neck supple. No JVD present.  Cardiovascular: Regular rhythm and normal heart sounds.  No murmur heard. Pulmonary/Chest: Effort normal and breath sounds normal. No respiratory distress. She has no wheezes. She has no rales.  Abdominal: Soft. Bowel sounds are normal. She exhibits no distension. There is no tenderness. There is no guarding.  Right PBD drain suture came off and appears dislocated.  Bile leak,   Musculoskeletal: Normal range of motion. She exhibits no edema or deformity.  trace edema.  Lymphadenopathy:    She has no cervical adenopathy.  Neurological: She is oriented to person, place, and time. No cranial nerve deficit.  Skin: Skin is dry. She is not diaphoretic. There is erythema.  Erythematous rash on both upper extremities and back and bilateral lower extremities. Her chronic lupus rash mainly concentrated on her anterior chest which looks stable.   Psychiatric: Mood, affect and judgment normal.      LABORATORY DATA:  I have reviewed the data as listed Lab Results  Component Value Date   WBC 7.9 07/19/2017   HGB 9.4 (L) 07/19/2017   HCT 28.7 (L) 07/19/2017   MCV 87.5 07/19/2017   PLT 226 07/19/2017   Recent Labs    04/29/17 0320  06/09/17 0519  07/07/17 1413 07/13/17 1434 07/19/17 1041  NA 138   < >  --    < > 134* 139 137  K 3.4*   < >  --    < > 3.2* 3.9 4.1  CL 101   < >  --    < > 100* 103 103  CO2 28   < >  --    < > _0 GLUCOSE 87   < >  --    < > 126* 183* 158*  BUN 13   < >  --    < > _1 CREATININE 0.62   < >  --    < > 0.58 0.71 0.65  CALCIUM 8.3*   < >  --    < > 8.9 8.7* 8.8*  GFRNONAA >60   < >  --    < > >60 >60 >60  GFRAA >60   < >  --    < > >60 >60 >60  PROT 6.7   < > 6.5   < > 7.4 6.9 7.3  ALBUMIN 2.9*   < > 2.8*   < > 3.2* 3.1* 3.3*  AST 63*   < > 52*   < > 38 51* 52*   ALT 51   < > 58*   < > 35 38 29  ALKPHOS 297*   < > 269*   < > 283* 267* 251*  BILITOT 3.1*   < > 0.7   < > 0.6 0.3 0.4  BILIDIR 1.9*  --  0.2  --   --   --   --   IBILI 1.2*  --  0.5  --   --   --   --    < > =  values in this interval not displayed.   [place] CA 19-9   09/14/16 220 --> 09/28/16 176--> 10/12/16 167-->12/07/2016 64-->12/14/2016 60. --> 12/29/2016 87--> 01/26/2017 99-->03/02/2017 53-->03/23/2017 72   RADIOGRAPHIC STUDIES: I have personally reviewed the radiological images as listed and agreed with the findings in the report. CT 12/14/2016 [facility] Impression: 1. Slight interval decrease in size of the left hepatic lobe cholangiocarcinoma. Slight interval decrease in size of the hypoattenuating left hepatic lobe lesion which now measures 3.6 x 2.8 cm, previously 3.9 x 2.9 cm 2. Slight interval increase in size of a porta hepatis lymph node. Other nodes are slightly smaller. 3. Multiple bilateral thyroid nodules. Recommend ultrasound for further evaluation. 4. Stable mediastinal lymph nodes.  CT chest abdomen pelvis 02/24/2017 comparing to 08/25/16 Study in [facility].  1. No acute findings within the chest, abdomen or pelvis. 2. There has been interval decrease in size of mass associated with left lobe of liver. No specific findings identified to suggest metastatic disease.Mass within the anterior and central aspect of the left lobe appears decreased in size from previous exam measuring 3.0 x 3.2 cm, 3. Bilateral percutaneous biliary drainage catheters are in place without significant biliary ductal dilatation. 4. Bilateral pleural calcifications and chronic interstitial changes of pulmonary fibrosis. Prominent mediastinal lymph nodes are nonspecific in the setting of interstitial lung disease.  CT abdomen pelvis w contrast 04/18/2017 comparing to 11.28/2018 study 2.8 x 2.5 cm hypoenhancing mass in segment 3 of the liver, mildly decreased. Associated  atrophy of the lateral segment left hepatic lobe. Two indwelling biliary stent, unchanged. Small nodes in the porta hepatis measuring up to 11 mm short axis, stable versus mildly decreased. No evidence of new/progressive metastatic disease.  CT abdomen pelvis w contrast 04/29/2017 comparing to 04/18/2017 exam.  Stable exam.  No new or progressive findings. 2.5 x 2.1 cm liver lesion, stable appearance of 2 bilary drains.    ASSESSMENT & PLAN:  Cancer Staging Cholangiocarcinoma [facility]) Staging form: Intrahepatic Bile Duct, AJCC 8th Edition - Clinical stage from 12/29/2016: Stage IV (cT1, cNX, pM1) - Signed by Earlie Server, MD on 12/29/2016  1. Cholangiocarcinoma ([place])   2. Diverticulitis   3. Encounter for antineoplastic chemotherapy   4. Cutaneous lupus erythematosus      # Cholangiocarcinoma: plan to resume her on single agent Gemzar soon. As her skin rash due to cutaneous lupus flare has not been controlled and patient reports not feeling well today, we will continue to hold Gemzar. Patient recent CA-19-9 remains stable.  Discussed with patient that it is probably okay to continue to hold for another 2 weeks and to after her skin rash is slightly better controlled.  We will follow-up and reassess her in 2 weeks #Hypomagnesia, Magnesia is 1.7,  continue taking Slow Mag 8m BID.  # Fatigue and weakness: continue 579mprednisone. ?Adrenal insufficiency, previously checked her cortisol and level was normal.  #Diverticulitis: Improved with Cipro and Flagyl.  Advised patient to stop antibiotics as she has finished 7 days course and has self stopped antibiotics anyhow.  #Cutaneous lupus flare advised patient to start: Start Azathioprine as instructed by Dr. KeJefm Bryantin combination with Plaquenil and steroids.  We we hope to eventually resume her single agent gemcitabine which may also induce immunosuppression which may help with her control of lupus flare.  # Anemia: Hemoglobin stable. Check folate  and ferritin. B12 was checked in December and was normal.  Ferritin came back 62, TIBC 379. Continue monitor. .Marland Kitchen  Return of visit: 2 weeks for reassessment.   Earlie Server, MD, PhD Hematology Oncology [facility] at [facility] Pager- [phone] 07/19/2017

## 2017-07-20 DIAGNOSIS — L905 Scar conditions and fibrosis of skin: Secondary | ICD-10-CM | POA: Diagnosis not present

## 2017-07-20 DIAGNOSIS — M320 Drug-induced systemic lupus erythematosus: Secondary | ICD-10-CM | POA: Diagnosis not present

## 2017-07-25 DIAGNOSIS — J449 Chronic obstructive pulmonary disease, unspecified: Secondary | ICD-10-CM | POA: Diagnosis not present

## 2017-07-26 DIAGNOSIS — B351 Tinea unguium: Secondary | ICD-10-CM | POA: Diagnosis not present

## 2017-07-26 DIAGNOSIS — E119 Type 2 diabetes mellitus without complications: Secondary | ICD-10-CM | POA: Diagnosis not present

## 2017-07-26 DIAGNOSIS — M79675 Pain in left toe(s): Secondary | ICD-10-CM | POA: Diagnosis not present

## 2017-07-26 DIAGNOSIS — M79674 Pain in right toe(s): Secondary | ICD-10-CM | POA: Diagnosis not present

## 2017-07-29 ENCOUNTER — Ambulatory Visit (INDEPENDENT_AMBULATORY_CARE_PROVIDER_SITE_OTHER): Payer: PPO | Admitting: *Deleted

## 2017-07-29 ENCOUNTER — Other Ambulatory Visit: Payer: Self-pay | Admitting: Oncology

## 2017-07-29 DIAGNOSIS — J449 Chronic obstructive pulmonary disease, unspecified: Secondary | ICD-10-CM | POA: Diagnosis not present

## 2017-07-29 DIAGNOSIS — D869 Sarcoidosis, unspecified: Secondary | ICD-10-CM | POA: Diagnosis not present

## 2017-07-29 NOTE — Progress Notes (Signed)
SMW performed today.  SIX MIN WALK 07/29/2017 06/03/2016  Medications none Premarin, Neurotin, Metformin, olmesartan-Amlodopine-HCTZ, Protonix, Prednisone, Tramadol and Effexor  Supplimental Oxygen during Test? (L/min) No No  Laps 5 4  Partial Lap (in Meters) 0 0  Baseline BP (sitting) 124/70 144/88  Baseline Heartrate 112 107  Baseline Dyspnea (Borg Scale) 3 0.5  Baseline Fatigue (Borg Scale) 8 4  Baseline SPO2 91 98  BP (sitting) 126/70 168/100  Heartrate 122 142  Dyspnea (Borg Scale) 3 5  Fatigue (Borg Scale) 8 4  SPO2 85 88  BP (sitting) 150/84 152/92  Heartrate 110 115  SPO2 97 97  Stopped or Paused before Six Minutes No Yes  Other Symptoms at end of Exercise - Pt stopped after 63m15s due to sats dropping and SOB  Distance Completed 240 192

## 2017-08-02 ENCOUNTER — Telehealth: Payer: Self-pay | Admitting: *Deleted

## 2017-08-02 ENCOUNTER — Inpatient Hospital Stay: Payer: PPO

## 2017-08-02 ENCOUNTER — Other Ambulatory Visit: Payer: Self-pay | Admitting: *Deleted

## 2017-08-02 ENCOUNTER — Inpatient Hospital Stay: Payer: PPO | Admitting: Oncology

## 2017-08-02 MED ORDER — LOPERAMIDE HCL 2 MG PO CAPS: ORAL_CAPSULE | ORAL | 0 refills | Status: DC

## 2017-08-02 NOTE — Telephone Encounter (Signed)
Imodium rx sent to pharmacy, notified pt, scheduled pt for labs, see md and possible fluids for tomorrow

## 2017-08-02 NOTE — Telephone Encounter (Signed)
Patient called to ask what Dr Tasia Catchings wants to do about her abdominal pain and diarrhea. She has not tried anything over the counter for it and I offered her to come in and she states she cannot come in and does not need IV fluids she just needs something for the diarrhea. I suggested she try Imodium AD and to call back if no improvement. She reports that she called early this morning to cancel her appointment today

## 2017-08-03 ENCOUNTER — Other Ambulatory Visit: Payer: Self-pay

## 2017-08-03 ENCOUNTER — Encounter: Payer: Self-pay | Admitting: Oncology

## 2017-08-03 ENCOUNTER — Other Ambulatory Visit: Payer: Self-pay | Admitting: Radiology

## 2017-08-03 ENCOUNTER — Inpatient Hospital Stay: Payer: PPO

## 2017-08-03 ENCOUNTER — Inpatient Hospital Stay (HOSPITAL_BASED_OUTPATIENT_CLINIC_OR_DEPARTMENT_OTHER): Payer: PPO | Admitting: Oncology

## 2017-08-03 VITALS — BP 123/73 | HR 107 | Temp 98.1°F | Resp 18 | Wt 151.2 lb

## 2017-08-03 DIAGNOSIS — R197 Diarrhea, unspecified: Secondary | ICD-10-CM

## 2017-08-03 DIAGNOSIS — R1084 Generalized abdominal pain: Secondary | ICD-10-CM | POA: Diagnosis not present

## 2017-08-03 DIAGNOSIS — E876 Hypokalemia: Secondary | ICD-10-CM

## 2017-08-03 DIAGNOSIS — R5383 Other fatigue: Secondary | ICD-10-CM

## 2017-08-03 DIAGNOSIS — E860 Dehydration: Secondary | ICD-10-CM

## 2017-08-03 DIAGNOSIS — R531 Weakness: Secondary | ICD-10-CM | POA: Diagnosis not present

## 2017-08-03 DIAGNOSIS — D649 Anemia, unspecified: Secondary | ICD-10-CM | POA: Diagnosis not present

## 2017-08-03 DIAGNOSIS — C221 Intrahepatic bile duct carcinoma: Secondary | ICD-10-CM

## 2017-08-03 LAB — MAGNESIUM: Magnesium: 1.8 mg/dL (ref 1.7–2.4)

## 2017-08-03 LAB — CBC WITH DIFFERENTIAL/PLATELET
BASOS ABS: 0.1 10*3/uL (ref 0–0.1)
Basophils Relative: 1 %
Eosinophils Absolute: 0.2 10*3/uL (ref 0–0.7)
Eosinophils Relative: 2 %
HCT: 29.6 % — ABNORMAL LOW (ref 35.0–47.0)
Hemoglobin: 9.9 g/dL — ABNORMAL LOW (ref 12.0–16.0)
LYMPHS ABS: 0.6 10*3/uL — AB (ref 1.0–3.6)
LYMPHS PCT: 6 %
MCH: 28.7 pg (ref 26.0–34.0)
MCHC: 33.5 g/dL (ref 32.0–36.0)
MCV: 85.5 fL (ref 80.0–100.0)
MONO ABS: 1.5 10*3/uL — AB (ref 0.2–0.9)
Monocytes Relative: 14 %
NEUTROS ABS: 8.2 10*3/uL — AB (ref 1.4–6.5)
Neutrophils Relative %: 77 %
Platelets: 240 10*3/uL (ref 150–440)
RBC: 3.46 MIL/uL — ABNORMAL LOW (ref 3.80–5.20)
RDW: 15.1 % — AB (ref 11.5–14.5)
WBC: 10.7 10*3/uL (ref 3.6–11.0)

## 2017-08-03 LAB — COMPREHENSIVE METABOLIC PANEL
ALBUMIN: 3.1 g/dL — AB (ref 3.5–5.0)
ALT: 32 U/L (ref 14–54)
ANION GAP: 8 (ref 5–15)
AST: 38 U/L (ref 15–41)
Alkaline Phosphatase: 272 U/L — ABNORMAL HIGH (ref 38–126)
BUN: 19 mg/dL (ref 6–20)
CHLORIDE: 101 mmol/L (ref 101–111)
CO2: 23 mmol/L (ref 22–32)
Calcium: 9.2 mg/dL (ref 8.9–10.3)
Creatinine, Ser: 0.77 mg/dL (ref 0.44–1.00)
GFR calc Af Amer: >60 mL/min (ref >60)
GFR calc non Af Amer: >60 mL/min (ref >60)
GLUCOSE: 181 mg/dL — AB (ref 65–99)
Potassium: 3.7 mmol/L (ref 3.5–5.1)
SODIUM: 132 mmol/L — AB (ref 135–145)
Total Bilirubin: 0.5 mg/dL (ref 0.3–1.2)
Total Protein: 7.3 g/dL (ref 6.5–8.1)

## 2017-08-03 MED ORDER — SODIUM CHLORIDE 0.9 % IV SOLN
INTRAVENOUS | Status: DC | PRN
  Administered 2017-08-03: 15:00:00 via INTRAVENOUS
  Filled 2017-08-03: qty 1000

## 2017-08-03 MED ORDER — METRONIDAZOLE 500 MG PO TABS: 500.0000 mg | ORAL_TABLET | Freq: Three times a day (TID) | ORAL | 0 refills | Status: DC

## 2017-08-03 MED ORDER — CIPROFLOXACIN HCL 500 MG PO TABS: 500.0000 mg | ORAL_TABLET | Freq: Two times a day (BID) | ORAL | 0 refills | Status: DC

## 2017-08-03 MED ORDER — HEPARIN SOD (PORK) LOCK FLUSH 100 UNIT/ML IV SOLN
INTRAVENOUS | Status: AC
Start: 2017-08-03 — End: ?
  Filled 2017-08-03: qty 5

## 2017-08-03 MED ORDER — HEPARIN SOD (PORK) LOCK FLUSH 100 UNIT/ML IV SOLN
500.0000 [IU] | Freq: Once | INTRAVENOUS | Status: AC
  Administered 2017-08-03: 500 [IU] via INTRAVENOUS

## 2017-08-03 NOTE — Progress Notes (Signed)
Hematology/Oncology Follow up note [facility] [phone] Fax:([phone]) [phone]  Patient Care Team: Idelle Crouch, MD as PCP - General (Internal Medicine) REASON FOR VISIT Follow up for treatment of cholangiocarcinoma  PERTINENT ONCOLOGY HISTORY Regina Hood 77 y.o.  female with PMH listed as below, most significantly for cholangiocarcinoma who has been on chemotherapy treatment presents to transfer her cancer care to our cancer center.  After extensive medical record review, summary of her diagnosis and treatment history are listed below.  1. Initially presented in 07/2016 to an outside ED with lower and upper GI bleed.  A. She was found to have new LFTs elevations including AST 340, ALT 379, Alk phos 420 and t bili 0.9.   B. [place] was obtained, which did not visualize the left lobe.  C. She was evaluated by gastroenterology because of her history of UC and underwent colonoscopy, which was unremarkable per path report.  2. 08/20/16, CT noted diffuse atrophy of the left lobe with left intrahepatic biliary ductal dilatation, with 4.1 cm ill-defined low-attenuation mass. This mass was suspicious for malignancy.  3. 08/2016, MRI/MRCP which showed abnormal left hepatic lobe with diffuse biliary dilatation with a shrunken left hepatic lobe and a 2.42.9 cm oval-shaped masslike component displacing the adjacent bile ducts. There is an approximately 1.6 cm segment truncated bile ducts involving common hepatic duct and right and left hepatic although no dilatation of the right hepatic duct system was seen.   4. 09/02/16, ERCP showed localized biliary stricture in the left intrahepatic duct with dilation of the left intrahepatic branches. Sphincterotomy was performed and a stent was placed. EUS with a 3.3 cm mass and left duct dilation.  5. Repeat ERCP was done 09/10/16, during which the stent was exchanged.  6. 09/20/16, taken to OR and noted to a have an peritoneal  implant with biopsy c/w adenocarcinoma. The neoplastic cells are positive for CK7. They are negative for CK20, CDX-2, TTF-1, Napsin A, hep-par1, glypican 3, and arginase. The immunohistochemical features are consistent with adenocarcinoma of upper gastrointestinal tract or pancreaticobiliary origin.  7. 09/28/16, initially seen in medical oncology clinic by Dr. Filomena Jungling 8. 09/29/16, admitted for biliary obstruction and cholangitis, s/p PBD placement, d/c 10/06/16 9. 10/12/16, initiated on gemcitabine (400 mg/m2). CA 19-9 167 10. 10/26/16, increased gemcitabine (800 mg/m2) 11. 11/23/2016 Cycle 3 Gemzar (872m/m2), 12/07/2016, Gemzar (8015mm2) added cisplatin 2564m2  12. 12/14/2016, CT CAP shows slight interval decrease in size of L hepatic lobe cholangio. Slight interval increase in size of porta hepatis lymph node whiel other nodes are slightly smaller. Multiple bilateral thyroid nodules. Stable mediastinal lymph nodes. CA 19-9 60 14  8/30 Cycle 4 Gemzar (800m48m) added cisplatin 20mg67m 15, 12/28/2016 Gemcitabine 800mg/42mcisplatin was hold due to anemia. S/p 1 PRBC transfusion.   [facility]   [facility] [place]    She forgot her appointment on day 8 and Gemcitabine 800mg/m60md cisplatin 20mg/m240m given  Day 9 of cycle 4 (01/05/2017).        01/19/2017  Cycle 5 Day 1Gemcitabine 800mg/m2,58mplatin 20mg/m2, 62munit blood transfusion.        01/26/2017 Cycle 5 Day 8 Gemcitabine 800mg/m2, c60matin 20mg/m2    60mShe can not tolerate Gemcitabine and Cisplatin due to cytopenia and profound weakness. She was continued on Gemcitabine        single agent. Last chemotherapy was 12/19.  16 She has been chronically on dexamethasone since she started her treatment in [place]. She was originally on 4 mg of dexamethasone when she transition her care to me. I have weaned down to 1 mg, however her skin rash got worse. After recent hospitalization at [facility], the 19m Dexamethasone was  discontinued after discharge. Due to profound weakness and the abrupt stop of Dexamethasone, I start patient on 547mprednisone on 05/16/2017.    17 [facility] She spiked fever one week later and had hyperbilirubinemia and was treated for possible infection. Her blood culture was negative for multiple times.     06 May 2017, Patient's PPD has been converted to [facility] felt not enough drainage and also abdominal pain.  She called [facility] and was instructed to have labs done locally and if no rising of bilirubin level, no intervention was needed.  At my office, she has had labs done which reviewed normal bilirubin level.  She appears dehydrated and she received a hydration session at out infusion center. .HMarland Kitchenr biliary drains have about 10-20 cc/day output, draining well since she was instructed to flush it. # [facility]   19 Patient has persistent skin rash despite treated with steroids.  During the interval she was referred back to see her rheumatologist and also establish care with dermatologist.  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 Currently on Plaquenil and Azathioprine.   INTERVAL HISTORY Regina [age]MAN89.o.  female with cholangiocarcinoma presents for treatment.  She has developed diarrhea, and feels really tired.  Also reports lower abdominal pressure and discomfort. She also started Azathioprine recently and skin rash is better.  Denies blood in stool.     Review of Systems  Constitutional: Positive for fatigue. Negative for appetite change, chills, diaphoresis and fever.  HENT:   Negative for hearing loss, mouth sores, nosebleeds, sore throat and trouble swallowing.   Eyes: Negative for eye problems and icterus.  Respiratory: Negative for chest tightness, cough,  hemoptysis and shortness of breath.        Use oxygen as needed at home, has not need to use lately.  Cardiovascular: Negative for chest pain and leg swelling.  Gastrointestinal: Negative for abdominal pain, blood in stool, constipation and diarrhea.  Endocrine: Negative for hot flashes.  Genitourinary: Negative for difficulty urinating, dysuria, frequency, hematuria and nocturia.   Musculoskeletal: Negative for arthralgias, back pain, flank pain and myalgias.  Skin: Positive for itching and rash.        rash on back improves, with a few area of back developing papular rash. Rrash on her arms persist. Now also have papular erythematous rash on her thighs  Neurological: Negative for dizziness, headaches, numbness and seizures.  Hematological: Negative for adenopathy.  Psychiatric/Behavioral: Negative for confusion and depression. The patient is not nervous/anxious.    MEDICAL HISTORY:  Past Medical History:  Diagnosis Date  . Arthritis   . Cancer ([place]  . Cellulitis   . Collagen vascular disease ([place]  . COPD (chronic obstructive pulmonary disease) ([place]  . DDD (degenerative disc disease), lumbar   . Depression   . Diabetes mellitus   . Diverticulitis   . GERD (gastroesophageal reflux disease)   . GI bleed   . Headache(784.0)   . Hypertension   .  Lupus ([place])   . Ulcerative colitis ([place])     SURGICAL HISTORY: Past Surgical History:  Procedure Laterality Date  . ABDOMINAL HYSTERECTOMY    . BREAST BIOPSY Right   . COLONOSCOPY    . COLONOSCOPY WITH PROPOFOL N/A 08/18/2016   Procedure: COLONOSCOPY WITH PROPOFOL;  Surgeon: Manya Silvas, MD;  Location: [facility] ENDOSCOPY;  Service: Endoscopy;  Laterality: N/A;  . ESOPHAGOGASTRODUODENOSCOPY (EGD) WITH PROPOFOL N/A 08/18/2016   Procedure: ESOPHAGOGASTRODUODENOSCOPY (EGD) WITH PROPOFOL;  Surgeon: Manya Silvas, MD;  Location: [facility] ENDOSCOPY;  Service: Endoscopy;  Laterality: N/A;  . IR BILIARY DRAIN PLACEMENT WITH CHOLANGIOGRAM   06/08/2017  . ORIF HUMERUS FRACTURE  09/17/2011   Procedure: OPEN REDUCTION INTERNAL FIXATION (ORIF) PROXIMAL HUMERUS FRACTURE;  Surgeon: Augustin Schooling, MD;  Location: [facility];  Service: Orthopedics;  Laterality: Left;    SOCIAL HISTORY: Social History   Socioeconomic History  . Marital status: Widowed    Spouse name: Not on file  . Number of children: Not on file  . Years of education: Not on file  . Highest education level: Not on file  Occupational History  . Not on file  Social Needs  . Financial resource strain: Not on file  . Food insecurity:    Worry: Not on file    Inability: Not on file  . Transportation needs:    Medical: Not on file    Non-medical: Not on file  Tobacco Use  . Smoking status: Former Smoker    Packs/day: 1.00    Years: 10.00    Pack years: 10.00    Types: Cigarettes    Last attempt to quit: 12/30/1998    Years since quitting: 18.6  . Smokeless tobacco: Never Used  . Tobacco comment: quit smoking in 2003  Substance and Sexual Activity  . Alcohol use: No  . Drug use: No  . Sexual activity: Not on file  Lifestyle  . Physical activity:    Days per week: Not on file    Minutes per session: Not on file  . Stress: Not on file  Relationships  . Social connections:    Talks on phone: Not on file    Gets together: Not on file    Attends religious service: Not on file    Active member of club or organization: Not on file    Attends meetings of clubs or organizations: Not on file    Relationship status: Not on file  . Intimate partner violence:    Fear of current or ex partner: Not on file    Emotionally abused: Not on file    Physically abused: Not on file    Forced sexual activity: Not on file  Other Topics Concern  . Not on file  Social History Narrative  . Not on file    FAMILY HISTORY: Family History  Problem Relation Age of Onset  . Breast cancer Mother 73  . COPD Father   . Prostate cancer Brother     ALLERGIES:  is allergic to  penicillins.  MEDICATIONS:  Current Outpatient Medications  Medication Sig Dispense Refill  . acetaminophen (TYLENOL) 500 MG tablet Take 1,000 mg by mouth 3 (three) times daily.    Marland Kitchen amLODipine (NORVASC) 10 MG tablet Take 1 tablet (10 mg total) by mouth daily. 30 tablet 0  . calcium carbonate 100 mg/ml SUSP Take by mouth daily.     . Cyanocobalamin (VITAMIN B-12 PO) Take 1,000 mcg by mouth daily.    . diphenoxylate-atropine (  LOMOTIL) 2.5-0.025 MG tablet Take 2 tablets by mouth 4 (four) times daily as needed for diarrhea or loose stools. 60 tablet 0  . Fluticasone-Salmeterol (ADVAIR DISKUS) 100-50 MCG/DOSE AEPB Inhale 1 puff into the lungs 2 (two) times daily. 60 each 3  . gabapentin (NEURONTIN) 100 MG capsule Take 100 mg by mouth 2 (two) times daily.     . hydroxychloroquine (PLAQUENIL) 200 MG tablet Take 200 mg by mouth 2 (two) times daily.     Marland Kitchen lidocaine-prilocaine (EMLA) cream Apply 1 application topically as needed. 30 g 3  . loperamide (IMODIUM) 2 MG capsule Take 2 capsules (4 mg) at the onset of diarrhea.  Take one capsule (2 mg) every 2 hours as needed.  Do not exceed 46m per day. 30 capsule 0  . LORazepam (ATIVAN) 0.5 MG tablet Take 0.5 mg every 6 (six) hours as needed by mouth.  0  . magnesium chloride (SLOW-MAG) 64 MG TBEC SR tablet Take 1 tablet (64 mg total) by mouth daily. 30 tablet 2  . metFORMIN (GLUCOPHAGE) 500 MG tablet Take 500 mg by mouth 2 (two) times daily with a meal.    . metoprolol tartrate (LOPRESSOR) 25 MG tablet Take 1 tablet (25 mg total) by mouth 2 (two) times daily. 60 tablet 0  . ondansetron (ZOFRAN) 8 MG tablet TAKE ONE TABLET EVERY 12 HOURS AS NEEDED 30 tablet 3  . oxyCODONE (OXY IR/ROXICODONE) 5 MG immediate release tablet Take 1 tablet (5 mg total) by mouth every 6 (six) hours as needed. 120 tablet 0  . pantoprazole (PROTONIX) 40 MG tablet Take 1 tablet (40 mg total) by mouth daily. 30 tablet 3  . potassium chloride SA (K-DUR,KLOR-CON) 20 MEQ tablet Take 1  tablet (20 mEq total) by mouth daily. 30 tablet 0  . predniSONE (DELTASONE) 1 MG tablet Take 5 tablets (5 mg total) by mouth daily with breakfast. 90 tablet 0  . prochlorperazine (COMPAZINE) 10 MG tablet Take 1 tablet (10 mg total) by mouth every 6 (six) hours as needed. 30 tablet 3  . senna (SENOKOT) 8.6 MG TABS tablet Take 2 tablets (17.2 mg total) by mouth daily. Hold for loose stools. 120 each 3  . sodium chloride 0.9 % injection Flush each biliary drains (2) with 10 mL two times daily.    . Sodium Chloride Flush (NORMAL SALINE FLUSH) 0.9 % SOLN 1 mL by Other route every 8 (eight) hours. Flush each biliary drain with 1 mL twice daily 84 Syringe 3  . venlafaxine XR (EFFEXOR-XR) 75 MG 24 hr capsule Take 225 mg by mouth daily.     .Marland Kitchenzolpidem (AMBIEN) 10 MG tablet Take 1 tablet (10 mg total) by mouth at bedtime as needed. 30 tablet 3   No current facility-administered medications for this visit.    Facility-Administered Medications Ordered in Other Visits  Medication Dose Route Frequency Provider Last Rate Last Dose  . 0.9 %  sodium chloride infusion   Intravenous PRN YEarlie Server MD   Stopped at 04/27/17 1400  . heparin lock flush 100 unit/mL  500 Units Intravenous Once YEarlie Server MD          .  PHYSICAL EXAMINATION: ECOG PERFORMANCE STATUS: 2 - Symptomatic, <50% confined to bed There were no vitals filed for this visit. There were no vitals filed for this visit. Physical Exam  Constitutional: She is oriented to person, place, and time and well-developed, well-nourished, and in no distress. No distress.  HENT:  Head: Normocephalic and atraumatic.  Mouth/Throat: Oropharynx is clear and moist. No oropharyngeal exudate.  Eyes: Pupils are equal, round, and reactive to light. EOM are normal. No scleral icterus.  Neck: Normal range of motion. Neck supple. No JVD present.  Cardiovascular: Regular rhythm and normal heart sounds.  No murmur heard. Pulmonary/Chest: Effort normal and breath sounds  normal. No respiratory distress. She has no wheezes. She has no rales.  Abdominal: Soft. Bowel sounds are normal. She exhibits no distension. There is no tenderness. There is no guarding.  Right PBD drain suture came off and appears dislocated.  Bile leak,   Musculoskeletal: Normal range of motion. She exhibits no edema or deformity.  trace edema.  Lymphadenopathy:    She has no cervical adenopathy.  Neurological: She is oriented to person, place, and time. No cranial nerve deficit.  Skin: Skin is dry. She is not diaphoretic. There is erythema.  Erythematous rash on both upper extremities and back and bilateral lower extremities. Her chronic lupus rash mainly concentrated on her anterior chest which looks stable.   Psychiatric: Mood, affect and judgment normal.      LABORATORY DATA:  I have reviewed the data as listed Lab Results  Component Value Date   WBC 7.9 07/19/2017   HGB 9.4 (L) 07/19/2017   HCT 28.7 (L) 07/19/2017   MCV 87.5 07/19/2017   PLT 226 07/19/2017   Recent Labs    04/29/17 0320  06/09/17 0519  07/07/17 1413 07/13/17 1434 07/19/17 1041  NA 138   < >  --    < > 134* 139 137  K 3.4*   < >  --    < > 3.2* 3.9 4.1  CL 101   < >  --    < > 100* 103 103  CO2 28   < >  --    < > 24 27 26   GLUCOSE 87   < >  --    < > 126* 183* 158*  BUN 13   < >  --    < > 17 11 13   CREATININE 0.62   < >  --    < > 0.58 0.71 0.65  CALCIUM 8.3*   < >  --    < > 8.9 8.7* 8.8*  GFRNONAA >60   < >  --    < > >60 >60 >60  GFRAA >60   < >  --    < > >60 >60 >60  PROT 6.7   < > 6.5   < > 7.4 6.9 7.3  ALBUMIN 2.9*   < > 2.8*   < > 3.2* 3.1* 3.3*  AST 63*   < > 52*   < > 38 51* 52*  ALT 51   < > 58*   < > 35 38 29  ALKPHOS 297*   < > 269*   < > 283* 267* 251*  BILITOT 3.1*   < > 0.7   < > 0.6 0.3 0.4  BILIDIR 1.9*  --  0.2  --   --   --   --   IBILI 1.2*  --  0.5  --   --   --   --    < > = values in this interval not displayed.   [place] CA 19-9   09/14/16 220 -->  09/28/16 176--> 10/12/16 167-->12/07/2016 64-->12/14/2016 60. --> 12/29/2016 87--> 01/26/2017 99-->03/02/2017 53-->03/23/2017 72   RADIOGRAPHIC STUDIES: I have personally reviewed the radiological images as listed  and agreed with the findings in the report. CT 12/14/2016 [facility] Impression: 1. Slight interval decrease in size of the left hepatic lobe cholangiocarcinoma. Slight interval decrease in size of the hypoattenuating left hepatic lobe lesion which now measures 3.6 x 2.8 cm, previously 3.9 x 2.9 cm 2. Slight interval increase in size of a porta hepatis lymph node. Other nodes are slightly smaller. 3. Multiple bilateral thyroid nodules. Recommend ultrasound for further evaluation. 4. Stable mediastinal lymph nodes.  CT chest abdomen pelvis 02/24/2017 comparing to 08/25/16 Study in [facility].  1. No acute findings within the chest, abdomen or pelvis. 2. There has been interval decrease in size of mass associated with left lobe of liver. No specific findings identified to suggest metastatic disease.Mass within the anterior and central aspect of the left lobe appears decreased in size from previous exam measuring 3.0 x 3.2 cm, 3. Bilateral percutaneous biliary drainage catheters are in place without significant biliary ductal dilatation. 4. Bilateral pleural calcifications and chronic interstitial changes of pulmonary fibrosis. Prominent mediastinal lymph nodes are nonspecific in the setting of interstitial lung disease.  CT abdomen pelvis w contrast 04/18/2017 comparing to 11.28/2018 study 2.8 x 2.5 cm hypoenhancing mass in segment 3 of the liver, mildly decreased. Associated atrophy of the lateral segment left hepatic lobe. Two indwelling biliary stent, unchanged. Small nodes in the porta hepatis measuring up to 11 mm short axis, stable versus mildly decreased. No evidence of new/progressive metastatic disease.  CT abdomen pelvis w contrast 04/29/2017 comparing to 04/18/2017  exam.  Stable exam.  No new or progressive findings. 2.5 x 2.1 cm liver lesion, stable appearance of 2 bilary drains.    ASSESSMENT & PLAN:  Cancer Staging Cholangiocarcinoma [facility]) Staging form: Intrahepatic Bile Duct, AJCC 8th Edition - Clinical stage from 12/29/2016: Stage IV (cT1, cNX, pM1) - Signed by Earlie Server, MD on 12/29/2016  1. Diarrhea, unspecified type   2. Generalized abdominal pain      # Cholangiocarcinoma:continue to hold treatment. Her CA 19.9 has been stable.  # Diarrhea and abdominal pain: possible gasteroenteritis or diverticulitis. Trial of Cipro and Flagyl. Ask patient to call if no improvement.  Repeat CT abdomen pelvis with contrast.  #Hypomagnesia, Magnesia is 1.8,  continue taking Slow Mag 44m BID.  # Fatigue and weakness: continue 549mprednisone.  #Cutaneous lupus flare advised patient to start: on Azathioprine  with Plaquenil and steroids.  Rash improve.   # Anemia: Hemoglobin stable. Check folate and ferritin. B12 was checked in December and was normal.  Ferritin came back 62, TIBC 379. Continue monitor. .    Return of visit: .08/08/2017 for reassement.   ZhEarlie ServerMD, PhD Hematology Oncology [facility] Center[facility]- [phone]/17/2019

## 2017-08-03 NOTE — Progress Notes (Signed)
Pt in for follow up, very weak.  Had diarrhea from Friday to Monday, started taking Immodium on Monday and diarrhea has improved.  Pt weight has decreased 4 lbs since last visit.  Reports poor appetite.  Pt blood pressure also checked orthostatic 140/82.

## 2017-08-04 ENCOUNTER — Ambulatory Visit: Payer: PPO

## 2017-08-04 ENCOUNTER — Ambulatory Visit: Admission: RE | Admit: 2017-08-04 | Payer: PPO | Source: Ambulatory Visit

## 2017-08-04 LAB — CANCER ANTIGEN 19-9: CA 19-9: 69 U/mL — ABNORMAL HIGH (ref 0–35)

## 2017-08-05 ENCOUNTER — Ambulatory Visit: Payer: PPO | Attending: Oncology

## 2017-08-05 ENCOUNTER — Ambulatory Visit: Payer: PPO

## 2017-08-06 ENCOUNTER — Emergency Department: Payer: PPO

## 2017-08-06 ENCOUNTER — Other Ambulatory Visit: Payer: Self-pay

## 2017-08-06 ENCOUNTER — Inpatient Hospital Stay
Admission: EM | Admit: 2017-08-06 | Discharge: 2017-08-10 | DRG: 189 | Disposition: A | Discharge status: Skilled Nursing Facility | Payer: PPO | Attending: Internal Medicine | Admitting: Internal Medicine

## 2017-08-06 ENCOUNTER — Encounter: Payer: Self-pay | Admitting: Emergency Medicine

## 2017-08-06 DIAGNOSIS — R748 Abnormal levels of other serum enzymes: Secondary | ICD-10-CM | POA: Diagnosis present

## 2017-08-06 DIAGNOSIS — Z9981 Dependence on supplemental oxygen: Secondary | ICD-10-CM

## 2017-08-06 DIAGNOSIS — E119 Type 2 diabetes mellitus without complications: Secondary | ICD-10-CM | POA: Diagnosis present

## 2017-08-06 DIAGNOSIS — M1990 Unspecified osteoarthritis, unspecified site: Secondary | ICD-10-CM | POA: Diagnosis present

## 2017-08-06 DIAGNOSIS — R000 Tachycardia, unspecified: Secondary | ICD-10-CM | POA: Diagnosis not present

## 2017-08-06 DIAGNOSIS — E871 Hypo-osmolality and hyponatremia: Secondary | ICD-10-CM | POA: Diagnosis present

## 2017-08-06 DIAGNOSIS — I119 Hypertensive heart disease without heart failure: Secondary | ICD-10-CM | POA: Diagnosis present

## 2017-08-06 DIAGNOSIS — Z87891 Personal history of nicotine dependence: Secondary | ICD-10-CM | POA: Diagnosis not present

## 2017-08-06 DIAGNOSIS — I10 Essential (primary) hypertension: Secondary | ICD-10-CM | POA: Diagnosis not present

## 2017-08-06 DIAGNOSIS — M6281 Muscle weakness (generalized): Secondary | ICD-10-CM | POA: Diagnosis not present

## 2017-08-06 DIAGNOSIS — F419 Anxiety disorder, unspecified: Secondary | ICD-10-CM | POA: Diagnosis present

## 2017-08-06 DIAGNOSIS — F329 Major depressive disorder, single episode, unspecified: Secondary | ICD-10-CM | POA: Diagnosis present

## 2017-08-06 DIAGNOSIS — Z66 Do not resuscitate: Secondary | ICD-10-CM | POA: Diagnosis present

## 2017-08-06 DIAGNOSIS — A419 Sepsis, unspecified organism: Secondary | ICD-10-CM | POA: Diagnosis present

## 2017-08-06 DIAGNOSIS — R0602 Shortness of breath: Secondary | ICD-10-CM | POA: Diagnosis not present

## 2017-08-06 DIAGNOSIS — Z90710 Acquired absence of both cervix and uterus: Secondary | ICD-10-CM | POA: Diagnosis not present

## 2017-08-06 DIAGNOSIS — K5190 Ulcerative colitis, unspecified, without complications: Secondary | ICD-10-CM | POA: Diagnosis present

## 2017-08-06 DIAGNOSIS — Z825 Family history of asthma and other chronic lower respiratory diseases: Secondary | ICD-10-CM | POA: Diagnosis not present

## 2017-08-06 DIAGNOSIS — Z4803 Encounter for change or removal of drains: Secondary | ICD-10-CM | POA: Diagnosis not present

## 2017-08-06 DIAGNOSIS — J449 Chronic obstructive pulmonary disease, unspecified: Secondary | ICD-10-CM | POA: Diagnosis present

## 2017-08-06 DIAGNOSIS — Z880 Allergy status to penicillin: Secondary | ICD-10-CM

## 2017-08-06 DIAGNOSIS — R05 Cough: Secondary | ICD-10-CM | POA: Diagnosis not present

## 2017-08-06 DIAGNOSIS — D869 Sarcoidosis, unspecified: Secondary | ICD-10-CM | POA: Diagnosis present

## 2017-08-06 DIAGNOSIS — E538 Deficiency of other specified B group vitamins: Secondary | ICD-10-CM | POA: Diagnosis not present

## 2017-08-06 DIAGNOSIS — R6520 Severe sepsis without septic shock: Secondary | ICD-10-CM | POA: Diagnosis not present

## 2017-08-06 DIAGNOSIS — Z7984 Long term (current) use of oral hypoglycemic drugs: Secondary | ICD-10-CM

## 2017-08-06 DIAGNOSIS — J9621 Acute and chronic respiratory failure with hypoxia: Secondary | ICD-10-CM | POA: Diagnosis present

## 2017-08-06 DIAGNOSIS — C221 Intrahepatic bile duct carcinoma: Secondary | ICD-10-CM | POA: Diagnosis present

## 2017-08-06 DIAGNOSIS — J849 Interstitial pulmonary disease, unspecified: Secondary | ICD-10-CM | POA: Diagnosis present

## 2017-08-06 DIAGNOSIS — J9601 Acute respiratory failure with hypoxia: Secondary | ICD-10-CM | POA: Diagnosis not present

## 2017-08-06 DIAGNOSIS — F418 Other specified anxiety disorders: Secondary | ICD-10-CM | POA: Diagnosis not present

## 2017-08-06 DIAGNOSIS — K219 Gastro-esophageal reflux disease without esophagitis: Secondary | ICD-10-CM | POA: Diagnosis present

## 2017-08-06 DIAGNOSIS — Z794 Long term (current) use of insulin: Secondary | ICD-10-CM | POA: Diagnosis not present

## 2017-08-06 DIAGNOSIS — M329 Systemic lupus erythematosus, unspecified: Secondary | ICD-10-CM | POA: Diagnosis present

## 2017-08-06 DIAGNOSIS — Z7401 Bed confinement status: Secondary | ICD-10-CM | POA: Diagnosis not present

## 2017-08-06 DIAGNOSIS — Z803 Family history of malignant neoplasm of breast: Secondary | ICD-10-CM

## 2017-08-06 DIAGNOSIS — Z7952 Long term (current) use of systemic steroids: Secondary | ICD-10-CM

## 2017-08-06 DIAGNOSIS — L930 Discoid lupus erythematosus: Secondary | ICD-10-CM | POA: Diagnosis not present

## 2017-08-06 DIAGNOSIS — E870 Hyperosmolality and hypernatremia: Secondary | ICD-10-CM | POA: Diagnosis not present

## 2017-08-06 DIAGNOSIS — R059 Cough, unspecified: Secondary | ICD-10-CM

## 2017-08-06 DIAGNOSIS — R509 Fever, unspecified: Secondary | ICD-10-CM | POA: Diagnosis present

## 2017-08-06 DIAGNOSIS — K5792 Diverticulitis of intestine, part unspecified, without perforation or abscess without bleeding: Secondary | ICD-10-CM | POA: Diagnosis not present

## 2017-08-06 DIAGNOSIS — Z9911 Dependence on respirator [ventilator] status: Secondary | ICD-10-CM | POA: Diagnosis not present

## 2017-08-06 DIAGNOSIS — I999 Unspecified disorder of circulatory system: Secondary | ICD-10-CM | POA: Diagnosis not present

## 2017-08-06 DIAGNOSIS — Z7951 Long term (current) use of inhaled steroids: Secondary | ICD-10-CM

## 2017-08-06 DIAGNOSIS — Z79899 Other long term (current) drug therapy: Secondary | ICD-10-CM

## 2017-08-06 DIAGNOSIS — Z8509 Personal history of malignant neoplasm of other digestive organs: Secondary | ICD-10-CM | POA: Diagnosis not present

## 2017-08-06 LAB — URINALYSIS, ROUTINE W REFLEX MICROSCOPIC
BILIRUBIN URINE: NEGATIVE
GLUCOSE, UA: NEGATIVE mg/dL
KETONES UR: 5 mg/dL — AB
Leukocytes, UA: NEGATIVE
NITRITE: NEGATIVE
PH: 5 (ref 5.0–8.0)
PROTEIN: 100 mg/dL — AB
Specific Gravity, Urine: 1.016 (ref 1.005–1.030)

## 2017-08-06 LAB — CBC WITH DIFFERENTIAL/PLATELET
Basophils Absolute: 0 10*3/uL (ref 0–0.1)
Basophils Relative: 0 %
EOS PCT: 0 %
Eosinophils Absolute: 0.1 10*3/uL (ref 0–0.7)
HEMATOCRIT: 31.1 % — AB (ref 35.0–47.0)
Hemoglobin: 10.1 g/dL — ABNORMAL LOW (ref 12.0–16.0)
LYMPHS ABS: 0.1 10*3/uL — AB (ref 1.0–3.6)
LYMPHS PCT: 1 %
MCH: 27.8 pg (ref 26.0–34.0)
MCHC: 32.6 g/dL (ref 32.0–36.0)
MCV: 85.3 fL (ref 80.0–100.0)
MONO ABS: 0.8 10*3/uL (ref 0.2–0.9)
Monocytes Relative: 6 %
Neutro Abs: 12 10*3/uL — ABNORMAL HIGH (ref 1.4–6.5)
Neutrophils Relative %: 93 %
PLATELETS: 231 10*3/uL (ref 150–440)
RBC: 3.64 MIL/uL — ABNORMAL LOW (ref 3.80–5.20)
RDW: 15.3 % — AB (ref 11.5–14.5)
WBC: 13.1 10*3/uL — ABNORMAL HIGH (ref 3.6–11.0)

## 2017-08-06 MED ORDER — SODIUM CHLORIDE 0.9 % IV BOLUS
1000.0000 mL | Freq: Once | INTRAVENOUS | Status: AC
  Administered 2017-08-06: 1000 mL via INTRAVENOUS

## 2017-08-06 MED ORDER — MORPHINE SULFATE (PF) 4 MG/ML IV SOLN
4.0000 mg | Freq: Once | INTRAVENOUS | Status: AC
  Administered 2017-08-06: 4 mg via INTRAVENOUS
  Filled 2017-08-06: qty 1

## 2017-08-06 MED ORDER — HALOPERIDOL LACTATE 5 MG/ML IJ SOLN
2.5000 mg | Freq: Once | INTRAMUSCULAR | Status: AC
  Administered 2017-08-06: 2.5 mg via INTRAVENOUS
  Filled 2017-08-06: qty 1

## 2017-08-06 MED ORDER — SODIUM CHLORIDE 0.9 % IV SOLN
2.0000 g | Freq: Once | INTRAVENOUS | Status: AC
  Administered 2017-08-06: 2 g via INTRAVENOUS
  Filled 2017-08-06: qty 2

## 2017-08-06 MED ORDER — LEVOFLOXACIN IN D5W 750 MG/150ML IV SOLN
750.0000 mg | Freq: Once | INTRAVENOUS | Status: AC
  Administered 2017-08-06: 750 mg via INTRAVENOUS
  Filled 2017-08-06: qty 150

## 2017-08-06 MED ORDER — KETOROLAC TROMETHAMINE 30 MG/ML IJ SOLN
15.0000 mg | Freq: Once | INTRAMUSCULAR | Status: AC
  Administered 2017-08-06: 15 mg via INTRAVENOUS
  Filled 2017-08-06: qty 1

## 2017-08-06 MED ORDER — VANCOMYCIN HCL IN DEXTROSE 1-5 GM/200ML-% IV SOLN
1000.0000 mg | Freq: Once | INTRAVENOUS | Status: AC
  Administered 2017-08-06: 1000 mg via INTRAVENOUS
  Filled 2017-08-06: qty 200

## 2017-08-06 NOTE — ED Triage Notes (Signed)
Pt arrives from home with c/o generalized body aches. Per EMS, pt had oral temperature at home of 103.1, PR 137, RR 30, BP 138/78, and CBG of 226. Pt is currently being treated for liver infection with Cipro and is a liver cancer pt. Per EMS, pt is chronically on 2L O2 at home but was found without any O2 on at the time of EMS arrival to scene.

## 2017-08-06 NOTE — ED Provider Notes (Signed)
[facility] Emergency Department Provider Note  ____________________________________________   First MD Initiated Contact with Patient 08/06/17 2318     (approximate)  I have reviewed the triage vital signs and the nursing notes.   HISTORY  Chief Complaint Blood Infection  History is limited by the patient's clinical condition  HPI Regina Hood is a 77 y.o. female who comes to the emergency department by EMS with myalgias and fever for the past several days.  She has a complex past medical history including cholangiocarcinoma and is DO NOT RESUSCITATE DO NOT INTUBATE.  She has a drain in for what sounds like cholangitis and is currently taking ciprofloxacin.  She does report some mild shortness of breath.  Her symptoms have been gradual onset slowly progressive and now severe.  Past Medical History:  Diagnosis Date  . Arthritis   . Cancer ([place])   . Cellulitis   . Collagen vascular disease ([place])   . COPD (chronic obstructive pulmonary disease) ([place])   . DDD (degenerative disc disease), lumbar   . Depression   . Diabetes mellitus   . Diverticulitis   . GERD (gastroesophageal reflux disease)   . GI bleed   . Headache(784.0)   . Hypertension   . Lupus ([place])   . Ulcerative colitis [facility])     Patient Active Problem List   Diagnosis Date Noted  . Malnutrition of moderate degree 06/09/2017  . Generalized abdominal pain   . Hyperbilirubinemia   . Hypomagnesemia   . Hypokalemia 04/27/2017  . Proteinuria 03/31/2017  . COPD exacerbation ([place]) 03/16/2017  . Dehydration 01/19/2017  . History of iron deficiency anemia 12/29/2016  . Systemic lupus erythematosus ([place]) 12/29/2016  . Cholangiocarcinoma ([place]) 12/29/2016  . History of ulcerative colitis 12/29/2016  . Goals of care, counseling/discussion 12/29/2016  . Sepsis ([place]) 11/13/2016  . At risk for sepsis 09/29/2016  . Red blood cell antibody positive, compatible PRBC difficult to obtain 09/22/2016    . Type 2 diabetes mellitus without complication, without long-term current use of insulin ([place]) 09/17/2016  . Liver mass, left lobe 08/23/2016  . Elevated liver enzymes 08/19/2016  . Pruritic rash 08/19/2016  . IDA (iron deficiency anemia) 07/18/2016  . Cellulitis of right upper extremity 07/05/2016  . History of rectal bleeding 07/05/2016  . Rectal bleeding 06/29/2016  . Hypertension 01/07/2016  . Anxiety and depression 09/26/2013  . DDD (degenerative disc disease) 09/26/2013  . Diabetes ([place]) 09/26/2013  . Sarcoidosis 09/26/2013  . UC (ulcerative colitis) ([place]) 09/26/2013  . Proximal humerus fracture, left, closed, initial encounter 09/17/2011    Past Surgical History:  Procedure Laterality Date  . ABDOMINAL HYSTERECTOMY    . BREAST BIOPSY Right   . COLONOSCOPY    . COLONOSCOPY WITH PROPOFOL N/A 08/18/2016   Procedure: COLONOSCOPY WITH PROPOFOL;  Surgeon: Manya Silvas, MD;  Location[facility] ENDOSCOPY;  Service: Endoscopy;  Laterality: N/A;  . ESOPHAGOGASTRODUODENOSCOPY (EGD) WITH PROPOFOL N/A 08/18/2016   Procedure: ESOPHAGOGASTRODUODENOSCOPY (EGD) WITH PROPOFOL;  Surgeon: Manya Silvas, MD;  Location: [facility] ENDOSCOPY;  Service: Endoscopy;  Laterality: N/A;  . IR BILIARY DRAIN PLACEMENT WITH CHOLANGIOGRAM  06/08/2017  . ORIF HUMERUS FRACTURE  09/17/2011   Procedure: OPEN REDUCTION INTERNAL FIXATION (ORIF) PROXIMAL HUMERUS FRACTURE;  Surgeon: Augustin Schooling, MD;  Location: [place];  Service: Orthopedics;  Laterality: Left;    Prior to Admission medications   Medication Sig Start Date End Date Taking? Authorizing Provider  acetaminophen (TYLENOL) 500 MG tablet Take 1,000 mg by  mouth 3 (three) times daily.    [provider]  amLODipine (NORVASC) 10 MG tablet Take 1 tablet (10 mg total) by mouth daily. 04/30/17   Nicholes Mango, MD  azaTHIOprine (IMURAN) 50 MG tablet Take 50 mg by mouth daily.    [provider]  calcium carbonate 100 mg/ml SUSP Take by mouth daily.      [provider]  ciprofloxacin (CIPRO) 500 MG tablet Take 1 tablet (500 mg total) by mouth 2 (two) times daily. 08/03/17   Earlie Server, MD  Cyanocobalamin (VITAMIN B-12 PO) Take 1,000 mcg by mouth daily.    [provider]  diphenoxylate-atropine (LOMOTIL) 2.5-0.025 MG tablet Take 2 tablets by mouth 4 (four) times daily as needed for diarrhea or loose stools. Patient not taking: Reported on 08/03/2017 07/07/17   Jacquelin Hawking, NP  Fluticasone-Salmeterol (ADVAIR DISKUS) 100-50 MCG/DOSE AEPB Inhale 1 puff into the lungs 2 (two) times daily. 07/18/17   Flora Lipps, MD  gabapentin (NEURONTIN) 100 MG capsule Take 100 mg by mouth 2 (two) times daily.     [provider]  hydroxychloroquine (PLAQUENIL) 200 MG tablet Take 200 mg by mouth 2 (two) times daily.     [provider]  lidocaine-prilocaine (EMLA) cream Apply 1 application topically as needed. 02/16/17   Cammie Sickle, MD  loperamide (IMODIUM) 2 MG capsule Take 2 capsules (4 mg) at the onset of diarrhea.  Take one capsule (2 mg) every 2 hours as needed.  Do not exceed 35m per day. 08/02/17   YEarlie Server MD  LORazepam (ATIVAN) 0.5 MG tablet Take 0.5 mg every 6 (six) hours as needed by mouth. 01/05/17   [provider]  magnesium chloride (SLOW-MAG) 64 MG TBEC SR tablet Take 1 tablet (64 mg total) by mouth daily. 07/13/17   BJacquelin Hawking NP  metFORMIN (GLUCOPHAGE) 500 MG tablet Take 500 mg by mouth 2 (two) times daily with a meal.    [provider]  metoprolol tartrate (LOPRESSOR) 25 MG tablet Take 1 tablet (25 mg total) by mouth 2 (two) times daily. 04/30/17   GNicholes Mango MD  metroNIDAZOLE (FLAGYL) 500 MG tablet Take 1 tablet (500 mg total) by mouth 3 (three) times daily. 08/03/17   YEarlie Server MD  ondansetron (ZOFRAN) 8 MG tablet TAKE ONE TABLET EVERY 12 HOURS AS NEEDED 04/06/17   YEarlie Server MD  oxyCODONE (OXY IR/ROXICODONE) 5 MG immediate release tablet Take 1 tablet (5 mg total) by mouth  every 6 (six) hours as needed. 01/12/17   YEarlie Server MD  pantoprazole (PROTONIX) 40 MG tablet Take 1 tablet (40 mg total) by mouth daily. 05/23/17   YEarlie Server MD  potassium chloride SA (K-DUR,KLOR-CON) 20 MEQ tablet Take 1 tablet (20 mEq total) by mouth daily. 07/07/17   BJacquelin Hawking NP  predniSONE (DELTASONE) 1 MG tablet Take 5 tablets (5 mg total) by mouth daily with breakfast. 07/13/17   BJacquelin Hawking NP  prochlorperazine (COMPAZINE) 10 MG tablet Take 1 tablet (10 mg total) by mouth every 6 (six) hours as needed. 02/16/17   BCammie Sickle MD  sodium chloride 0.9 % injection Flush each biliary drains (2) with 10 mL two times daily. 03/18/17   [provider]  Sodium Chloride Flush (NORMAL SALINE FLUSH) 0.9 % SOLN 1 mL by Other route every 8 (eight) hours. Flush each biliary drain with 1 mL twice daily 06/24/17   YEarlie Server MD  triamcinolone cream (KENALOG) 0.1 %  07/20/17  [provider]  venlafaxine XR (EFFEXOR-XR) 75 MG 24 hr capsule Take 225 mg by mouth daily.     [provider]  zolpidem (AMBIEN) 10 MG tablet Take 1 tablet (10 mg total) by mouth at bedtime as needed. 02/16/17 07/05/17  Cammie Sickle, MD    Allergies Penicillins  Family History  Problem Relation Age of Onset  . Breast cancer Mother 81  . COPD Father   . Prostate cancer Brother     Social History Social History   Tobacco Use  . Smoking status: Former Smoker    Packs/day: 1.00    Years: 10.00    Pack years: 10.00    Types: Cigarettes    Last attempt to quit: 12/30/1998    Years since quitting: 18.6  . Smokeless tobacco: Never Used  . Tobacco comment: quit smoking in 2003  Substance Use Topics  . Alcohol use: No  . Drug use: No    Review of Systems History is limited by the patient's clinical condition ____________________________________________   PHYSICAL EXAM:  VITAL SIGNS: ED Triage Vitals  Enc Vitals Group     BP      Pulse      Resp      Temp       Temp src      SpO2      Weight      Height      Head Circumference      Peak Flow      Pain Score      Pain Loc      Pain Edu?      Excl. in [place]?     Constitutional: Appears critically ill with elevated respiratory rate and chills Eyes: PERRL EOMI. Head: Atraumatic. Nose: No congestion/rhinnorhea. Mouth/Throat: No trismus Neck: No stridor.   Cardiovascular: Tachycardic rate, regular rhythm. Grossly normal heart sounds.  Good peripheral circulation. Respiratory: Increased respiratory effort with mild crackles bilaterally Gastrointestinal: Distended abdomen soft mild diffuse tenderness with no focality no rebound or guarding or peritonitis Musculoskeletal: No lower extremity edema   Neurologic:  Normal speech and language. No gross focal neurologic deficits are appreciated. Skin: Significant psoriasis over anterior thighs.  What appears to be possible melanoma in the right shoulder Psychiatric: Mood and affect are normal. Speech and behavior are normal.    ____________________________________________   DIFFERENTIAL includes but not limited to  Sepsis, septic shock, pneumonia, influenza, pyelonephritis ____________________________________________   LABS (all labs ordered are listed, but only abnormal results are displayed)  Labs Reviewed  LACTIC ACID, PLASMA - Abnormal; Notable for the following components:      Result Value   Lactic Acid, Venous 5.3 (*)    All other components within normal limits  COMPREHENSIVE METABOLIC PANEL - Abnormal; Notable for the following components:   Sodium 130 (*)    Chloride 98 (*)    CO2 21 (*)    Glucose, Bld 178 (*)    Calcium 8.5 (*)    Albumin 3.1 (*)    AST 58 (*)    Alkaline Phosphatase 241 (*)    All other components within normal limits  TROPONIN I - Abnormal; Notable for the following components:   Troponin I 0.03 (*)    All other components within normal limits  CBC WITH DIFFERENTIAL/PLATELET - Abnormal; Notable for the  following components:   WBC 13.1 (*)    RBC 3.64 (*)    Hemoglobin 10.1 (*)    HCT 31.1 (*)  RDW 15.3 (*)    Neutro Abs 12.0 (*)    Lymphs Abs 0.1 (*)    All other components within normal limits  URINALYSIS, ROUTINE W REFLEX MICROSCOPIC - Abnormal; Notable for the following components:   Color, Urine AMBER (*)    APPearance CLEAR (*)    Hgb urine dipstick MODERATE (*)    Ketones, ur 5 (*)    Protein, ur 100 (*)    Bacteria, UA RARE (*)    Squamous Epithelial / LPF 0-5 (*)    All other components within normal limits  BLOOD GAS, VENOUS - Abnormal; Notable for the following components:   pCO2, Ven 37 (*)    Acid-base deficit 3.5 (*)    All other components within normal limits  CULTURE, BLOOD (ROUTINE X 2)  CULTURE, BLOOD (ROUTINE X 2)  URINE CULTURE  LIPASE, BLOOD  PROCALCITONIN  PROTIME-INR  INFLUENZA PANEL BY PCR (TYPE A & B)  LACTIC ACID, PLASMA    Lab work reviewed by me with a number of abnormalities most notably elevated white count and lactic acid concerning for severe sepsis __________________________________________  EKG  ED ECG REPORT I, Darel Hong, the attending physician, personally viewed and interpreted this ECG.  Date: 08/07/2017 EKG Time:  Rate: 126 Rhythm: normal sinus rhythm QRS Axis: normal Intervals: normal ST/T Wave abnormalities: normal Narrative Interpretation: no evidence of acute ischemia  ____________________________________________  RADIOLOGY  Chest x-ray reviewed by me consistent with fluid overload ____________________________________________   PROCEDURES  Procedure(s) performed: no  .Critical Care Performed by: Darel Hong, MD Authorized by: Darel Hong, MD   Critical care provider statement:    Critical care time (minutes):  30   Critical care time was exclusive of:  Separately billable procedures and treating other patients   Critical care was necessary to treat or prevent imminent or life-threatening  deterioration of the following conditions:  Sepsis   Critical care was time spent personally by me on the following activities:  Development of treatment plan with patient or surrogate, discussions with consultants, evaluation of patient's response to treatment, examination of patient, obtaining history from patient or surrogate, ordering and performing treatments and interventions, ordering and review of laboratory studies, ordering and review of radiographic studies, pulse oximetry, re-evaluation of patient's condition and review of old charts    Critical Care performed: Yes   Observation: no ____________________________________________   INITIAL IMPRESSION / ASSESSMENT AND PLAN / ED COURSE  Pertinent labs & imaging results that were available during my care of the patient were reviewed by me and considered in my medical decision making (see chart for details).  Patient arrives critically ill-appearing febrile to 103 degrees with elevated respiratory rate and tachycardic.  Differential is extremely broad and includes continued cholangitis, influenza, pyelonephritis etc.  I performed a bedside ultrasound she has only trace ascites and not enough for me to safely tap.     ----------------------------------------- 12:05 AM on 08/07/2017 -----------------------------------------  I appreciate that the patient's lactic acid came back critical high at 5 however the patient already has pulmonary edema and she is DO NOT INTUBATE DO NOT RESUSCITATE.  Further fluids are contraindicated at this time.  We will continue to treat her aggressively with antibiotics.  I discussed with the hospitalist Dr. Jannifer Franklin who has graciously agreed to admit the patient to his service. ____________________________________________   FINAL CLINICAL IMPRESSION(S) / ED DIAGNOSES  Final diagnoses:  Sepsis, due to unspecified organism ([place])  Severe sepsis ([place])      NEW  MEDICATIONS STARTED DURING THIS  VISIT:  New Prescriptions   No medications on file     Note:  This document was prepared using Dragon voice recognition software and may include unintentional dictation errors.     Darel Hong, MD 08/07/17 [phone]

## 2017-08-06 NOTE — Progress Notes (Signed)
CODE SEPSIS - PHARMACY COMMUNICATION  **Broad Spectrum Antibiotics should be administered within 1 hour of Sepsis diagnosis**  Time Code Sepsis Called/Page Received: 4/20 2321  Antibiotics Ordered: 04/20 2329 vanc/aztreonam/levaquin  Time of 1st antibiotic administration: 04/20 2342  Additional action taken by pharmacy:   If necessary, Name of Provider/Nurse Contacted:     Eloise Harman ,PharmD Clinical Pharmacist  08/06/2017  11:51 PM

## 2017-08-07 ENCOUNTER — Inpatient Hospital Stay: Payer: PPO

## 2017-08-07 DIAGNOSIS — K219 Gastro-esophageal reflux disease without esophagitis: Secondary | ICD-10-CM | POA: Diagnosis present

## 2017-08-07 DIAGNOSIS — F419 Anxiety disorder, unspecified: Secondary | ICD-10-CM | POA: Diagnosis present

## 2017-08-07 DIAGNOSIS — R748 Abnormal levels of other serum enzymes: Secondary | ICD-10-CM | POA: Diagnosis present

## 2017-08-07 DIAGNOSIS — J9621 Acute and chronic respiratory failure with hypoxia: Secondary | ICD-10-CM | POA: Diagnosis present

## 2017-08-07 DIAGNOSIS — Z87891 Personal history of nicotine dependence: Secondary | ICD-10-CM | POA: Diagnosis not present

## 2017-08-07 DIAGNOSIS — C221 Intrahepatic bile duct carcinoma: Secondary | ICD-10-CM | POA: Diagnosis present

## 2017-08-07 DIAGNOSIS — R509 Fever, unspecified: Secondary | ICD-10-CM | POA: Diagnosis present

## 2017-08-07 DIAGNOSIS — Z7984 Long term (current) use of oral hypoglycemic drugs: Secondary | ICD-10-CM | POA: Diagnosis not present

## 2017-08-07 DIAGNOSIS — K5190 Ulcerative colitis, unspecified, without complications: Secondary | ICD-10-CM | POA: Diagnosis present

## 2017-08-07 DIAGNOSIS — E119 Type 2 diabetes mellitus without complications: Secondary | ICD-10-CM | POA: Diagnosis present

## 2017-08-07 DIAGNOSIS — Z90710 Acquired absence of both cervix and uterus: Secondary | ICD-10-CM | POA: Diagnosis not present

## 2017-08-07 DIAGNOSIS — F329 Major depressive disorder, single episode, unspecified: Secondary | ICD-10-CM | POA: Diagnosis present

## 2017-08-07 DIAGNOSIS — M329 Systemic lupus erythematosus, unspecified: Secondary | ICD-10-CM | POA: Diagnosis present

## 2017-08-07 DIAGNOSIS — Z66 Do not resuscitate: Secondary | ICD-10-CM | POA: Diagnosis present

## 2017-08-07 DIAGNOSIS — Z9981 Dependence on supplemental oxygen: Secondary | ICD-10-CM | POA: Diagnosis not present

## 2017-08-07 DIAGNOSIS — E871 Hypo-osmolality and hyponatremia: Secondary | ICD-10-CM | POA: Diagnosis present

## 2017-08-07 DIAGNOSIS — J849 Interstitial pulmonary disease, unspecified: Secondary | ICD-10-CM | POA: Diagnosis present

## 2017-08-07 DIAGNOSIS — D869 Sarcoidosis, unspecified: Secondary | ICD-10-CM | POA: Diagnosis present

## 2017-08-07 DIAGNOSIS — Z825 Family history of asthma and other chronic lower respiratory diseases: Secondary | ICD-10-CM | POA: Diagnosis not present

## 2017-08-07 DIAGNOSIS — J449 Chronic obstructive pulmonary disease, unspecified: Secondary | ICD-10-CM | POA: Diagnosis present

## 2017-08-07 DIAGNOSIS — I119 Hypertensive heart disease without heart failure: Secondary | ICD-10-CM | POA: Diagnosis present

## 2017-08-07 DIAGNOSIS — Z803 Family history of malignant neoplasm of breast: Secondary | ICD-10-CM | POA: Diagnosis not present

## 2017-08-07 DIAGNOSIS — Z880 Allergy status to penicillin: Secondary | ICD-10-CM | POA: Diagnosis not present

## 2017-08-07 DIAGNOSIS — M1990 Unspecified osteoarthritis, unspecified site: Secondary | ICD-10-CM | POA: Diagnosis present

## 2017-08-07 DIAGNOSIS — R000 Tachycardia, unspecified: Secondary | ICD-10-CM | POA: Diagnosis not present

## 2017-08-07 LAB — BASIC METABOLIC PANEL
ANION GAP: 4 — AB (ref 5–15)
BUN: 17 mg/dL (ref 6–20)
CALCIUM: 7.7 mg/dL — AB (ref 8.9–10.3)
CO2: 24 mmol/L (ref 22–32)
CREATININE: 0.83 mg/dL (ref 0.44–1.00)
Chloride: 102 mmol/L (ref 101–111)
GFR calc non Af Amer: >60 mL/min (ref >60)
GLUCOSE: 122 mg/dL — AB (ref 65–99)
POTASSIUM: 3.7 mmol/L (ref 3.5–5.1)
Sodium: 130 mmol/L — ABNORMAL LOW (ref 135–145)

## 2017-08-07 LAB — TROPONIN I
TROPONIN I: 0.03 ng/mL — AB (ref <0.03)
Troponin I: 0.03 ng/mL (ref <0.03)
Troponin I: 0.04 ng/mL (ref <0.03)

## 2017-08-07 LAB — BLOOD GAS, VENOUS
Acid-base deficit: 3.5 mmol/L — ABNORMAL HIGH (ref 0.0–2.0)
Bicarbonate: 21.4 mmol/L (ref 20.0–28.0)
O2 SAT: 70.2 %
PATIENT TEMPERATURE: 37
PO2 VEN: 38 mmHg (ref 32.0–45.0)
pCO2, Ven: 37 mmHg — ABNORMAL LOW (ref 44.0–60.0)
pH, Ven: 7.37 (ref 7.250–7.430)

## 2017-08-07 LAB — CBC
HCT: 26.3 % — ABNORMAL LOW (ref 35.0–47.0)
Hemoglobin: 8.7 g/dL — ABNORMAL LOW (ref 12.0–16.0)
MCH: 28.4 pg (ref 26.0–34.0)
MCHC: 33 g/dL (ref 32.0–36.0)
MCV: 86 fL (ref 80.0–100.0)
PLATELETS: 209 10*3/uL (ref 150–440)
RBC: 3.05 MIL/uL — ABNORMAL LOW (ref 3.80–5.20)
RDW: 15.4 % — AB (ref 11.5–14.5)
WBC: 11.6 10*3/uL — AB (ref 3.6–11.0)

## 2017-08-07 LAB — COMPREHENSIVE METABOLIC PANEL
ALBUMIN: 3.1 g/dL — AB (ref 3.5–5.0)
ALT: 29 U/L (ref 14–54)
AST: 58 U/L — AB (ref 15–41)
Alkaline Phosphatase: 241 U/L — ABNORMAL HIGH (ref 38–126)
Anion gap: 11 (ref 5–15)
BUN: 13 mg/dL (ref 6–20)
CHLORIDE: 98 mmol/L — AB (ref 101–111)
CO2: 21 mmol/L — AB (ref 22–32)
Calcium: 8.5 mg/dL — ABNORMAL LOW (ref 8.9–10.3)
Creatinine, Ser: 0.79 mg/dL (ref 0.44–1.00)
GFR calc Af Amer: >60 mL/min (ref >60)
GFR calc non Af Amer: >60 mL/min (ref >60)
GLUCOSE: 178 mg/dL — AB (ref 65–99)
POTASSIUM: 3.7 mmol/L (ref 3.5–5.1)
SODIUM: 130 mmol/L — AB (ref 135–145)
Total Bilirubin: 0.6 mg/dL (ref 0.3–1.2)
Total Protein: 6.8 g/dL (ref 6.5–8.1)

## 2017-08-07 LAB — LACTIC ACID, PLASMA
LACTIC ACID, VENOUS: 2.2 mmol/L — AB (ref 0.5–1.9)
Lactic Acid, Venous: 5.3 mmol/L (ref 0.5–1.9)
Lactic Acid, Venous: 0.7 mmol/L (ref 0.5–1.9)

## 2017-08-07 LAB — GLUCOSE, CAPILLARY
GLUCOSE-CAPILLARY: 97 mg/dL (ref 65–99)
Glucose-Capillary: 127 mg/dL — ABNORMAL HIGH (ref 65–99)
Glucose-Capillary: 107 mg/dL — ABNORMAL HIGH (ref 65–99)
Glucose-Capillary: 103 mg/dL — ABNORMAL HIGH (ref 65–99)

## 2017-08-07 LAB — LIPASE, BLOOD: LIPASE: 23 U/L (ref 11–51)

## 2017-08-07 LAB — PROCALCITONIN: Procalcitonin: 0.45 ng/mL

## 2017-08-07 LAB — PROTIME-INR
INR: 1.09
Prothrombin Time: 14 seconds (ref 11.4–15.2)

## 2017-08-07 LAB — INFLUENZA PANEL BY PCR (TYPE A & B)
INFLAPCR: NEGATIVE
Influenza B By PCR: NEGATIVE

## 2017-08-07 MED ORDER — RACEPINEPHRINE HCL 2.25 % IN NEBU
0.5000 mL | INHALATION_SOLUTION | Freq: Once | RESPIRATORY_TRACT | Status: AC
  Administered 2017-08-07: 0.5 mL via RESPIRATORY_TRACT
  Filled 2017-08-07: qty 0.5

## 2017-08-07 MED ORDER — AZATHIOPRINE 50 MG PO TABS
50.0000 mg | ORAL_TABLET | Freq: Every day | ORAL | Status: DC
  Administered 2017-08-07 – 2017-08-10 (×4): 50 mg via ORAL
  Filled 2017-08-07 (×4): qty 1

## 2017-08-07 MED ORDER — VANCOMYCIN HCL IN DEXTROSE 1-5 GM/200ML-% IV SOLN
1000.0000 mg | INTRAVENOUS | Status: DC
  Administered 2017-08-07 – 2017-08-08 (×3): 1000 mg via INTRAVENOUS
  Filled 2017-08-07 (×4): qty 200

## 2017-08-07 MED ORDER — IPRATROPIUM-ALBUTEROL 0.5-2.5 (3) MG/3ML IN SOLN
3.0000 mL | Freq: Three times a day (TID) | RESPIRATORY_TRACT | Status: DC
  Administered 2017-08-07 – 2017-08-10 (×8): 3 mL via RESPIRATORY_TRACT
  Filled 2017-08-07 (×10): qty 3

## 2017-08-07 MED ORDER — GABAPENTIN 100 MG PO CAPS
100.0000 mg | ORAL_CAPSULE | Freq: Two times a day (BID) | ORAL | Status: DC
  Administered 2017-08-07 – 2017-08-10 (×8): 100 mg via ORAL
  Filled 2017-08-07 (×8): qty 1

## 2017-08-07 MED ORDER — INSULIN ASPART 100 UNIT/ML ~~LOC~~ SOLN
0.0000 [IU] | Freq: Three times a day (TID) | SUBCUTANEOUS | Status: DC
  Administered 2017-08-07: 1 [IU] via SUBCUTANEOUS
  Administered 2017-08-09 (×2): 2 [IU] via SUBCUTANEOUS
  Administered 2017-08-10 (×2): 1 [IU] via SUBCUTANEOUS
  Filled 2017-08-07 (×5): qty 1

## 2017-08-07 MED ORDER — LORAZEPAM 0.5 MG PO TABS
0.5000 mg | ORAL_TABLET | Freq: Four times a day (QID) | ORAL | Status: DC | PRN
  Administered 2017-08-07 – 2017-08-10 (×5): 0.5 mg via ORAL
  Filled 2017-08-07 (×5): qty 1

## 2017-08-07 MED ORDER — PANTOPRAZOLE SODIUM 40 MG PO TBEC
40.0000 mg | DELAYED_RELEASE_TABLET | Freq: Every day | ORAL | Status: DC
  Administered 2017-08-07 – 2017-08-10 (×4): 40 mg via ORAL
  Filled 2017-08-07 (×4): qty 1

## 2017-08-07 MED ORDER — ONDANSETRON HCL 4 MG/2ML IJ SOLN
4.0000 mg | Freq: Four times a day (QID) | INTRAMUSCULAR | Status: DC | PRN
  Administered 2017-08-09: 22:00:00 4 mg via INTRAVENOUS
  Filled 2017-08-07: qty 2

## 2017-08-07 MED ORDER — METOPROLOL TARTRATE 25 MG PO TABS
25.0000 mg | ORAL_TABLET | Freq: Two times a day (BID) | ORAL | Status: DC
  Administered 2017-08-07 (×2): 25 mg via ORAL
  Filled 2017-08-07 (×2): qty 1

## 2017-08-07 MED ORDER — HYDROXYCHLOROQUINE SULFATE 200 MG PO TABS
200.0000 mg | ORAL_TABLET | Freq: Two times a day (BID) | ORAL | Status: DC
  Administered 2017-08-07 – 2017-08-10 (×7): 200 mg via ORAL
  Filled 2017-08-07 (×9): qty 1

## 2017-08-07 MED ORDER — INSULIN ASPART 100 UNIT/ML ~~LOC~~ SOLN: 0.0000 [IU] | Freq: Every day | SUBCUTANEOUS | Status: DC

## 2017-08-07 MED ORDER — ONDANSETRON HCL 4 MG PO TABS: 4.0000 mg | ORAL_TABLET | Freq: Four times a day (QID) | ORAL | Status: DC | PRN

## 2017-08-07 MED ORDER — SODIUM CHLORIDE 0.9 % IV SOLN
1.0000 g | Freq: Three times a day (TID) | INTRAVENOUS | Status: DC
  Administered 2017-08-07 – 2017-08-09 (×8): 1 g via INTRAVENOUS
  Filled 2017-08-07 (×9): qty 1

## 2017-08-07 MED ORDER — ACETAMINOPHEN 325 MG PO TABS
650.0000 mg | ORAL_TABLET | Freq: Four times a day (QID) | ORAL | Status: DC | PRN
  Administered 2017-08-07 – 2017-08-10 (×9): 650 mg via ORAL
  Filled 2017-08-07 (×10): qty 2

## 2017-08-07 MED ORDER — METRONIDAZOLE 500 MG PO TABS
500.0000 mg | ORAL_TABLET | Freq: Three times a day (TID) | ORAL | Status: DC
  Administered 2017-08-07 – 2017-08-09 (×7): 500 mg via ORAL
  Filled 2017-08-07 (×7): qty 1

## 2017-08-07 MED ORDER — OXYCODONE HCL 5 MG PO TABS
5.0000 mg | ORAL_TABLET | Freq: Four times a day (QID) | ORAL | Status: DC | PRN
  Administered 2017-08-07 – 2017-08-10 (×5): 5 mg via ORAL
  Filled 2017-08-07 (×5): qty 1

## 2017-08-07 MED ORDER — METOPROLOL TARTRATE 50 MG PO TABS
50.0000 mg | ORAL_TABLET | Freq: Two times a day (BID) | ORAL | Status: DC
  Administered 2017-08-07 – 2017-08-08 (×2): 50 mg via ORAL
  Filled 2017-08-07 (×2): qty 1

## 2017-08-07 MED ORDER — RACEPINEPHRINE HCL 2.25 % IN NEBU
INHALATION_SOLUTION | RESPIRATORY_TRACT | Status: AC
  Filled 2017-08-07: qty 0.5

## 2017-08-07 MED ORDER — ENOXAPARIN SODIUM 40 MG/0.4ML ~~LOC~~ SOLN
40.0000 mg | SUBCUTANEOUS | Status: DC
  Administered 2017-08-07 – 2017-08-09 (×3): 40 mg via SUBCUTANEOUS
  Filled 2017-08-07 (×3): qty 0.4

## 2017-08-07 MED ORDER — SODIUM CHLORIDE 0.9 % IV SOLN
INTRAVENOUS | Status: AC
  Administered 2017-08-07: 04:00:00 via INTRAVENOUS

## 2017-08-07 MED ORDER — METOPROLOL TARTRATE 5 MG/5ML IV SOLN
5.0000 mg | INTRAVENOUS | Status: DC | PRN
  Administered 2017-08-07 (×2): 5 mg via INTRAVENOUS
  Filled 2017-08-07 (×2): qty 5

## 2017-08-07 MED ORDER — PROCHLORPERAZINE EDISYLATE 10 MG/2ML IJ SOLN
5.0000 mg | INTRAMUSCULAR | Status: DC | PRN
  Filled 2017-08-07: qty 1

## 2017-08-07 MED ORDER — MORPHINE SULFATE (PF) 4 MG/ML IV SOLN: 4.0000 mg | INTRAVENOUS | Status: DC | PRN

## 2017-08-07 MED ORDER — MOMETASONE FURO-FORMOTEROL FUM 100-5 MCG/ACT IN AERO
2.0000 | INHALATION_SPRAY | Freq: Two times a day (BID) | RESPIRATORY_TRACT | Status: DC
  Administered 2017-08-07 – 2017-08-08 (×3): 2 via RESPIRATORY_TRACT
  Filled 2017-08-07: qty 8.8

## 2017-08-07 MED ORDER — VENLAFAXINE HCL ER 75 MG PO CP24
225.0000 mg | ORAL_CAPSULE | Freq: Every day | ORAL | Status: DC
  Administered 2017-08-07 – 2017-08-10 (×4): 225 mg via ORAL
  Filled 2017-08-07 (×4): qty 3

## 2017-08-07 MED ORDER — PREDNISONE 10 MG PO TABS
5.0000 mg | ORAL_TABLET | Freq: Every day | ORAL | Status: DC
  Administered 2017-08-07 – 2017-08-10 (×4): 5 mg via ORAL
  Filled 2017-08-07 (×4): qty 1

## 2017-08-07 MED ORDER — SODIUM CHLORIDE 0.9 % IV BOLUS
1000.0000 mL | Freq: Once | INTRAVENOUS | Status: AC
  Administered 2017-08-07: 03:00:00 1000 mL via INTRAVENOUS

## 2017-08-07 MED ORDER — ACETAMINOPHEN 650 MG RE SUPP: 650.0000 mg | Freq: Four times a day (QID) | RECTAL | Status: DC | PRN

## 2017-08-07 NOTE — Progress Notes (Signed)
Pharmacy Antibiotic Note  Regina Hood is a 77 y.o. female admitted on 08/06/2017 with sepsis/intra-abdominal infection.  Pharmacy has been consulted for vancomycin and meropenem dosing.  Plan: DW 58g  Vd  41L kei 0.049 hr-1  T1/2 14 hours Vancomycin 1 gram q 18 hours ordered with stacked dosing. Level before 5th dose. Goal trough 15-20.  Meropenem 1 gram q 8 hours ordered.  Height: 5' 2"  (157.5 cm) Weight: 151 lb (68.5 kg) IBW/kg (Calculated) : 50.1  Temp (24hrs), Avg:100.2 F (37.9 C), Min:98.3 F (36.8 C), Max:103 F (39.4 C)  Recent Labs  Lab 08/03/17 1353 08/06/17 2329 08/07/17 0215  WBC 10.7 13.1*  --   CREATININE 0.77 0.79  --   LATICACIDVEN  --  5.3* 2.2*    Estimated Creatinine Clearance: 54.3 mL/min (by C-G formula based on SCr of 0.79 mg/dL).    Allergies  Allergen Reactions  . Penicillins Nausea Only    Antimicrobials this admission: Aztreonam, levaquin x1 4/20; vancomycin 4/20, meropenem 4/21  >>    >>   Dose adjustments this admission:   Microbiology results: 4/20 BCx: pending 4/20 UCx: pending       4/20 UA:(-) 4/20 CXR: edema, atalectasis Thank you for allowing pharmacy to be a part of this patient's care.  Jenea Dake S 08/07/2017 3:05 AM

## 2017-08-07 NOTE — Progress Notes (Signed)
Pt called complaining of feeling something in her throat.  Pt with loud expiratory wheeze. Pt also complained on need to void.  Periwick present but pt states she can't urinate laying down.  Helped pt up to bsc with 2 assist and pt unable to void.  Vitals stable.  O2 sat is 99% on 3 L [place].  Dr Marcille Blanco to see pt.  Ordered chest xray and bipap.  Both services present.  Bladder scanned pt and she had 140 cc.  Reported off to Richardson Landry and Camera operator on pt disposition. Dorna Bloom RN

## 2017-08-07 NOTE — Progress Notes (Signed)
Took patient off of bipap and placed on 3l [place]. Patient stated her breathing was feeling better and wanted a sip of water. Richardson Landry RN notified of patient being off of bipap and on [place]. Will continue to monitor.

## 2017-08-07 NOTE — H&P (Signed)
[place] [facility] [place] NAME: Regina Hood    MR#:  [id number]  DATE OF BIRTH:  [birth year]  DATE OF ADMISSION:  08/06/2017  PRIMARY CARE PHYSICIAN: Idelle Crouch, MD   REQUESTING/REFERRING PHYSICIAN: Mable Paris, MD  CHIEF COMPLAINT:   Chief Complaint  Patient presents with  . Blood Infection    HISTORY OF PRESENT ILLNESS:  Regina Hood  is a 77 y.o. female who presents with malaise and chills for the past several days.  She presents to the ED tonight and is found to be febrile with leukocytosis and tachycardia.  Sepsis protocol initiated.  Unclear source of infection at this time, though she does have a biliary drain and there is strong suspicion she may have infection there versus bacteremia.  No pneumonia seen on x-ray and UA was not suspicious for infection.  Hospitalist called for admission  PAST MEDICAL HISTORY:   Past Medical History:  Diagnosis Date  . Arthritis   . Cancer ([place])   . Cellulitis   . Collagen vascular disease ([place])   . COPD (chronic obstructive pulmonary disease) ([place])   . DDD (degenerative disc disease), lumbar   . Depression   . Diabetes mellitus   . Diverticulitis   . GERD (gastroesophageal reflux disease)   . GI bleed   . Headache(784.0)   . Hypertension   . Lupus ([place])   . Ulcerative colitis ([place])      PAST SURGICAL HISTORY:   Past Surgical History:  Procedure Laterality Date  . ABDOMINAL HYSTERECTOMY    . BREAST BIOPSY Right   . COLONOSCOPY    . COLONOSCOPY WITH PROPOFOL N/A 08/18/2016   Procedure: COLONOSCOPY WITH PROPOFOL;  Surgeon: Manya Silvas, MD;  Location: [facility] ENDOSCOPY;  Service: Endoscopy;  Laterality: N/A;  . ESOPHAGOGASTRODUODENOSCOPY (EGD) WITH PROPOFOL N/A 08/18/2016   Procedure: ESOPHAGOGASTRODUODENOSCOPY (EGD) WITH PROPOFOL;  Surgeon: Manya Silvas, MD;  Location: [facility] ENDOSCOPY;  Service: Endoscopy;  Laterality: N/A;  . IR BILIARY DRAIN PLACEMENT WITH CHOLANGIOGRAM   06/08/2017  . ORIF HUMERUS FRACTURE  09/17/2011   Procedure: OPEN REDUCTION INTERNAL FIXATION (ORIF) PROXIMAL HUMERUS FRACTURE;  Surgeon: Augustin Schooling, MD;  Location: [facility];  Service: Orthopedics;  Laterality: Left;     SOCIAL HISTORY:   Social History   Tobacco Use  . Smoking status: Former Smoker    Packs/day: 1.00    Years: 10.00    Pack years: 10.00    Types: Cigarettes    Last attempt to quit: 12/30/1998    Years since quitting: 18.6  . Smokeless tobacco: Never Used  . Tobacco comment: quit smoking in 2003  Substance Use Topics  . Alcohol use: No     FAMILY HISTORY:   Family History  Problem Relation Age of Onset  . Breast cancer Mother 37  . COPD Father   . Prostate cancer Brother      DRUG ALLERGIES:   Allergies  Allergen Reactions  . Penicillins Nausea Only    MEDICATIONS AT HOME:   Prior to Admission medications   Medication Sig Start Date End Date Taking? Authorizing Provider  acetaminophen (TYLENOL) 500 MG tablet Take 1,000 mg by mouth 3 (three) times daily.    [provider]  amLODipine (NORVASC) 10 MG tablet Take 1 tablet (10 mg total) by mouth daily. 04/30/17   Nicholes Mango, MD  azaTHIOprine (IMURAN) 50 MG tablet Take 50 mg by mouth daily.    [provider]  calcium  carbonate 100 mg/ml SUSP Take by mouth daily.     [provider]  ciprofloxacin (CIPRO) 500 MG tablet Take 1 tablet (500 mg total) by mouth 2 (two) times daily. 08/03/17   Earlie Server, MD  Cyanocobalamin (VITAMIN B-12 PO) Take 1,000 mcg by mouth daily.    [provider]  diphenoxylate-atropine (LOMOTIL) 2.5-0.025 MG tablet Take 2 tablets by mouth 4 (four) times daily as needed for diarrhea or loose stools. Patient not taking: Reported on 08/03/2017 07/07/17   Jacquelin Hawking, NP  Fluticasone-Salmeterol (ADVAIR DISKUS) 100-50 MCG/DOSE AEPB Inhale 1 puff into the lungs 2 (two) times daily. 07/18/17   Flora Lipps, MD  gabapentin (NEURONTIN) 100 MG capsule  Take 100 mg by mouth 2 (two) times daily.     [provider]  hydroxychloroquine (PLAQUENIL) 200 MG tablet Take 200 mg by mouth 2 (two) times daily.     [provider]  lidocaine-prilocaine (EMLA) cream Apply 1 application topically as needed. 02/16/17   Cammie Sickle, MD  loperamide (IMODIUM) 2 MG capsule Take 2 capsules (4 mg) at the onset of diarrhea.  Take one capsule (2 mg) every 2 hours as needed.  Do not exceed 40m per day. 08/02/17   YEarlie Server MD  LORazepam (ATIVAN) 0.5 MG tablet Take 0.5 mg every 6 (six) hours as needed by mouth. 01/05/17   [provider]  magnesium chloride (SLOW-MAG) 64 MG TBEC SR tablet Take 1 tablet (64 mg total) by mouth daily. 07/13/17   BJacquelin Hawking NP  metFORMIN (GLUCOPHAGE) 500 MG tablet Take 500 mg by mouth 2 (two) times daily with a meal.    [provider]  metoprolol tartrate (LOPRESSOR) 25 MG tablet Take 1 tablet (25 mg total) by mouth 2 (two) times daily. 04/30/17   GNicholes Mango MD  metroNIDAZOLE (FLAGYL) 500 MG tablet Take 1 tablet (500 mg total) by mouth 3 (three) times daily. 08/03/17   YEarlie Server MD  ondansetron (ZOFRAN) 8 MG tablet TAKE ONE TABLET EVERY 12 HOURS AS NEEDED 04/06/17   YEarlie Server MD  oxyCODONE (OXY IR/ROXICODONE) 5 MG immediate release tablet Take 1 tablet (5 mg total) by mouth every 6 (six) hours as needed. 01/12/17   YEarlie Server MD  pantoprazole (PROTONIX) 40 MG tablet Take 1 tablet (40 mg total) by mouth daily. 05/23/17   YEarlie Server MD  potassium chloride SA (K-DUR,KLOR-CON) 20 MEQ tablet Take 1 tablet (20 mEq total) by mouth daily. 07/07/17   BJacquelin Hawking NP  predniSONE (DELTASONE) 1 MG tablet Take 5 tablets (5 mg total) by mouth daily with breakfast. 07/13/17   BJacquelin Hawking NP  prochlorperazine (COMPAZINE) 10 MG tablet Take 1 tablet (10 mg total) by mouth every 6 (six) hours as needed. 02/16/17   BCammie Sickle MD  sodium chloride 0.9 % injection Flush each biliary drains (2)  with 10 mL two times daily. 03/18/17   [provider]  Sodium Chloride Flush (NORMAL SALINE FLUSH) 0.9 % SOLN 1 mL by Other route every 8 (eight) hours. Flush each biliary drain with 1 mL twice daily 06/24/17   YEarlie Server MD  triamcinolone cream (KENALOG) 0.1 %  07/20/17   [provider]  venlafaxine XR (EFFEXOR-XR) 75 MG 24 hr capsule Take 225 mg by mouth daily.     [provider]  zolpidem (AMBIEN) 10 MG tablet Take 1 tablet (10 mg total) by mouth at bedtime as needed. 02/16/17 07/05/17  BCammie Sickle MD  REVIEW OF SYSTEMS:  Review of Systems  Constitutional: Positive for chills and malaise/fatigue. Negative for fever and weight loss.  HENT: Negative for ear pain, hearing loss and tinnitus.   Eyes: Negative for blurred vision, double vision, pain and redness.  Respiratory: Negative for cough, hemoptysis and shortness of breath.   Cardiovascular: Negative for chest pain, palpitations, orthopnea and leg swelling.  Gastrointestinal: Negative for abdominal pain, constipation, diarrhea, nausea and vomiting.  Genitourinary: Negative for dysuria, frequency and hematuria.  Musculoskeletal: Negative for back pain, joint pain and neck pain.  Skin:       No acne, rash, or lesions  Neurological: Negative for dizziness, tremors, focal weakness and weakness.  Endo/Heme/Allergies: Negative for polydipsia. Does not bruise/bleed easily.  Psychiatric/Behavioral: Negative for depression. The patient is not nervous/anxious and does not have insomnia.      VITAL SIGNS:   Vitals:   08/06/17 2324 08/06/17 2325  BP: 125/60   Pulse: (!) 130   Resp: (!) 36   Temp: (!) 103 F (39.4 C)   TempSrc: Oral   SpO2: 100%   Weight:  68.5 kg (151 lb)  Height:  5' 2"  (1.575 m)   Wt Readings from Last 3 Encounters:  08/06/17 68.5 kg (151 lb)  08/03/17 68.6 kg (151 lb 3 oz)  07/19/17 70.6 kg (155 lb 9 oz)    PHYSICAL EXAMINATION:  Physical Exam  Vitals  reviewed. Constitutional: She is oriented to person, place, and time. She appears well-developed and well-nourished. No distress.  HENT:  Head: Normocephalic and atraumatic.  Mouth/Throat: Oropharynx is clear and moist.  Eyes: Pupils are equal, round, and reactive to light. Conjunctivae and EOM are normal. No scleral icterus.  Neck: Normal range of motion. Neck supple. No JVD present. No thyromegaly present.  Cardiovascular: Regular rhythm and intact distal pulses. Exam reveals no gallop and no friction rub.  No murmur heard. Tachycardic  Respiratory: Effort normal and breath sounds normal. No respiratory distress. She has no wheezes. She has no rales.  GI: Soft. Bowel sounds are normal. She exhibits no distension. There is tenderness.  Musculoskeletal: Normal range of motion. She exhibits no edema.  No arthritis, no gout  Lymphadenopathy:    She has no cervical adenopathy.  Neurological: She is alert and oriented to person, place, and time. No cranial nerve deficit.  No dysarthria, no aphasia  Skin: Skin is warm and dry. No rash noted. No erythema.  Psychiatric: She has a normal mood and affect. Her behavior is normal. Judgment and thought content normal.    LABORATORY PANEL:   CBC Recent Labs  Lab 08/06/17 2329  WBC 13.1*  HGB 10.1*  HCT 31.1*  PLT 231   ------------------------------------------------------------------------------------------------------------------  Chemistries  Recent Labs  Lab 08/03/17 1353 08/06/17 2329  NA 132* 130*  K 3.7 3.7  CL 101 98*  CO2 23 21*  GLUCOSE 181* 178*  BUN 19 13  CREATININE 0.77 0.79  CALCIUM 9.2 8.5*  MG 1.8  --   AST 38 58*  ALT 32 29  ALKPHOS 272* 241*  BILITOT 0.5 0.6   ------------------------------------------------------------------------------------------------------------------  Cardiac Enzymes Recent Labs  Lab 08/06/17 2329  TROPONINI 0.03*    ------------------------------------------------------------------------------------------------------------------  RADIOLOGY:  Dg Chest Port 1 View  Result Date: 08/06/2017 CLINICAL DATA:  Sepsis EXAM: PORTABLE CHEST 1 VIEW COMPARISON:  04/29/2017 FINDINGS: Right Port-A-Cath is unchanged. Cardiomegaly with vascular congestion. Interstitial prominence throughout the lungs could reflect interstitial edema. Bibasilar atelectasis. No visible effusions. IMPRESSION: Cardiomegaly with vascular congestion  and interstitial prominence, possibly representing interstitial edema. Bibasilar atelectasis. Electronically Signed   By: Rolm Baptise M.D.   On: 08/06/2017 23:49    EKG:   Orders placed or performed during the hospital encounter of 08/06/17  . ED EKG 12-Lead  . ED EKG 12-Lead    IMPRESSION AND PLAN:  Principal Problem:   Sepsis ([place]) -IV antibiotics initiated, cultures sent, lactic acid significantly elevated, IV fluids in place and will trend lactate until within normal limits, cultures sent Active Problems:   Systemic lupus erythematosus ([place]) -home dose immunosuppressants   Cholangiocarcinoma ([place]) -biliary drain in place   Hypertension -stable, continue home meds   Sarcoidosis -home dose Plaquenil   Type 2 diabetes mellitus without complication, without long-term current use of insulin (HCC) -sliding scale insulin with corresponding glucose checks   Anxiety and depression -continue home medications  Chart review performed and case discussed with ED provider. Labs, imaging and/or ECG reviewed by provider and discussed with patient/family. Management plans discussed with the patient and/or family.  DVT PROPHYLAXIS: SubQ lovenox  GI PROPHYLAXIS: None  ADMISSION STATUS: Inpatient  CODE STATUS: DNR Code Status History    Date Active Date Inactive Code Status Order ID Comments User Context   06/07/2017 1308 06/09/2017 1715 DNR [id number]  Max Sane, MD Inpatient   06/07/2017 1200  06/07/2017 1308 Full Code [id number]  Max Sane, MD Inpatient   05/04/2017 1612 05/05/2017 2249 DNR [id number]  Bettey Costa, MD Inpatient   05/04/2017 1546 05/04/2017 1612 Full Code [id number]  Bettey Costa, MD Inpatient   04/27/2017 1826 04/30/2017 2200 DNR [id number]  Vaughan Basta, MD Inpatient   03/16/2017 1712 03/17/2017 1608 DNR [id number]  Henreitta Leber, MD Inpatient   11/13/2016 0301 11/14/2016 1412 DNR [id number]  Harrie Foreman, MD Inpatient   06/29/2016 1533 07/01/2016 1922 DNR [id number]  Hillary Bow, MD ED   09/17/2011 1443 09/19/2011 1624 Full Code [id number]  Corum, Jeani Sow, RN Inpatient    Questions for Most Recent Historical Code Status (Order [id number])    Question Answer Comment   In the event of cardiac or respiratory ARREST Do not call a "code blue"    In the event of cardiac or respiratory ARREST Do not perform Intubation, CPR, defibrillation or ACLS    In the event of cardiac or respiratory ARREST Use medication by any route, position, wound care, and other measures to relive pain and suffering. May use oxygen, suction and manual treatment of airway obstruction as needed for comfort.       TOTAL TIME TAKING CARE OF THIS PATIENT: 45 minutes.   Tashona Calk FIELDING 08/07/2017, 1:01 AM  [employer]  [phone]  CC: Primary care physician; Idelle Crouch, MD  Note:  This document was prepared using Dragon voice recognition software and may include unintentional dictation errors.

## 2017-08-07 NOTE — Progress Notes (Addendum)
[place] at [place] NAME: Torrance Frech    MR#:  [id number]  DATE OF BIRTH:  Jun 08, 1940  SUBJECTIVE:  CHIEF COMPLAINT:   Chief Complaint  Patient presents with  . Blood Infection  Patient seen and evaluated today Difficulty breathing and had a loud expiratory wheeze this morning Patient had to be put on BiPAP for respiratory distress No complaints of any chest pain  REVIEW OF SYSTEMS:    ROS  CONSTITUTIONAL: Had fever. Has fatigue, weakness. No weight gain, no weight loss.  EYES: No blurry or double vision.  ENT: No tinnitus. No postnasal drip. No redness of the oropharynx.  RESPIRATORY: Has occasional cough, wheeze present, no hemoptysis. No dyspnea.  CARDIOVASCULAR: No chest pain. No orthopnea. No palpitations. No syncope.  GASTROINTESTINAL: No nausea, no vomiting or diarrhea. No abdominal pain. No melena or hematochezia.  GENITOURINARY: No dysuria or hematuria.  ENDOCRINE: No polyuria or nocturia. No heat or cold intolerance.  HEMATOLOGY: No anemia. No bruising. No bleeding.  INTEGUMENTARY: No rashes. No lesions.  MUSCULOSKELETAL: No arthritis. No swelling. No gout.  NEUROLOGIC: No numbness, tingling, or ataxia. No seizure-type activity.  PSYCHIATRIC: No anxiety. No insomnia. No ADD.   DRUG ALLERGIES:   Allergies  Allergen Reactions  . Penicillins Nausea Only    VITALS:  Blood pressure (!) 154/86, pulse (!) 108, temperature 99 F (37.2 C), temperature source Oral, resp. rate (!) 24, height 5' 2"  (1.575 m), weight 68.5 kg (151 lb), SpO2 99 %.  PHYSICAL EXAMINATION:   Physical Exam  GENERAL:  77 y.o.-year-old patient lying in the bed with respiratory distress EYES: Pupils equal, round, reactive to light and accommodation. No scleral icterus. Extraocular muscles intact.  HEENT: Head atraumatic, normocephalic. Oropharynx and nasopharynx clear.  NECK:  Supple, no jugular venous distention. No thyroid enlargement, no tenderness.   LUNGS: Decreased breath sounds bilaterally, bilateral wheezing. No use of accessory muscles of respiration.  CARDIOVASCULAR: S1, S2 tachycardia. No murmurs, rubs, or gallops.  ABDOMEN: Soft, mild tenderness around umbilicus, nondistended. Bowel sounds present. No organomegaly or mass.  EXTREMITIES: No cyanosis, clubbing or edema b/l.    NEUROLOGIC: Cranial nerves II through XII are intact. No focal Motor or sensory deficits b/l.   PSYCHIATRIC: The patient is alert and oriented x 3.  SKIN: No obvious rash, lesion, or ulcer.   LABORATORY PANEL:   CBC Recent Labs  Lab 08/07/17 0405  WBC 11.6*  HGB 8.7*  HCT 26.3*  PLT 209   ------------------------------------------------------------------------------------------------------------------ Chemistries  Recent Labs  Lab 08/03/17 1353 08/06/17 2329 08/07/17 0405  NA 132* 130* 130*  K 3.7 3.7 3.7  CL 101 98* 102  CO2 23 21* 24  GLUCOSE 181* 178* 122*  BUN 19 13 17   CREATININE 0.77 0.79 0.83  CALCIUM 9.2 8.5* 7.7*  MG 1.8  --   --   AST 38 58*  --   ALT 32 29  --   ALKPHOS 272* 241*  --   BILITOT 0.5 0.6  --    ------------------------------------------------------------------------------------------------------------------  Cardiac Enzymes Recent Labs  Lab 08/07/17 0405  TROPONINI 0.03*   ------------------------------------------------------------------------------------------------------------------  RADIOLOGY:  Dg Chest Port 1 View  Result Date: 08/07/2017 CLINICAL DATA:  Short of breath and cough. Lupus. Hypertension. COPD. Diabetes. EXAM: PORTABLE CHEST 1 VIEW COMPARISON:  One day prior FINDINGS: Right Port-A-Cath terminates at the mid SVC. Midline trachea. Cardiomegaly accentuated by AP portable technique. No pleural effusion or pneumothorax. Basilar and peripheral predominant interstitial thickening  is similar, given differences in technique. No well-defined lobar consolidation. Surgical clips project over the  right lung base. IMPRESSION: Basilar predominant interstitial thickening, likely all attributed to interstitial lung disease when correlated with 02/24/2017 CT. Cardiomegaly without convincing evidence of superimposed congestive failure. Electronically Signed   By: Abigail Miyamoto M.D.   On: 08/07/2017 07:44   Dg Chest Port 1 View  Result Date: 08/06/2017 CLINICAL DATA:  Sepsis EXAM: PORTABLE CHEST 1 VIEW COMPARISON:  04/29/2017 FINDINGS: Right Port-A-Cath is unchanged. Cardiomegaly with vascular congestion. Interstitial prominence throughout the lungs could reflect interstitial edema. Bibasilar atelectasis. No visible effusions. IMPRESSION: Cardiomegaly with vascular congestion and interstitial prominence, possibly representing interstitial edema. Bibasilar atelectasis. Electronically Signed   By: Rolm Baptise M.D.   On: 08/06/2017 23:49     ASSESSMENT AND PLAN:   77 year old elderly female patient with history of lupus erythematosus, cholangiocarcinoma with biliary drain in place, hypertension, sarcoidosis, type 2 diabetes mellitus, anxiety disorder, depression presented currently under hospitalist service for sepsis, fever.  1.  Acute respiratory distress BiPAP for respiratory failure Once breathing status improves patient will be weaned off BiPAP and put on oxygen via nasal cannula Nebulization treatments  2.  Sepsis Follow-up lactic acid level Continue IV vancomycin and IV meropenem antibiotic Follow-up blood cultures  3.  Pneumonia Continue IV antibiotics  4.  Systemic lupus erythematosus On home dose immunosuppressants  5.  History of cholangiocarcinoma with biliary drain in place  6.  Sarcoidosis on home dose Plaquenil  7.  Diabetes mellitus type sliding scale coverage with insulin  8.  Hyponatremia IV fluid hydration All the records are reviewed and case discussed with Care Management/Social Worker. Management plans discussed with the patient, family and they are in  agreement.  CODE STATUS: DNR  DVT Prophylaxis: SCDs  TOTAL CRITICAL CARE TIME TAKING CARE OF THIS PATIENT: 53 minutes.   POSSIBLE D/C IN 2 to 3 DAYS, DEPENDING ON CLINICAL CONDITION.  Saundra Shelling M.D on 08/07/2017 at 10:38 AM  Between 7am to 6pm - Pager - [phone]  After 6pm go to [email or web address] - password [email or web address] [facility]  Office  [phone]  CC: Primary care physician; Idelle Crouch, MD  Note: This dictation was prepared with Dragon dictation along with smaller phrase technology. Any transcriptional errors that result from this process are unintentional.

## 2017-08-07 NOTE — Progress Notes (Addendum)
Pt HR is at 152 right now. Page doctor Pyreddy. Awaiting callback. Will continue to monitor.  Update [phone]): Doctor Pyreddy called and ordered Metropolol 5 mg IV every 4 hours for HR > 120 and ordered to modify metropolol from 25 mg twice a day to 50 mg twice a day. Will continue to monitor.

## 2017-08-07 NOTE — ED Notes (Signed)
Patient transported to 123

## 2017-08-08 ENCOUNTER — Encounter: Payer: Self-pay | Admitting: Radiology

## 2017-08-08 ENCOUNTER — Inpatient Hospital Stay: Payer: PPO

## 2017-08-08 ENCOUNTER — Inpatient Hospital Stay: Payer: PPO | Admitting: Oncology

## 2017-08-08 LAB — CBC
HCT: 28 % — ABNORMAL LOW (ref 35.0–47.0)
Hemoglobin: 9.4 g/dL — ABNORMAL LOW (ref 12.0–16.0)
MCH: 28.7 pg (ref 26.0–34.0)
MCHC: 33.7 g/dL (ref 32.0–36.0)
MCV: 85.4 fL (ref 80.0–100.0)
Platelets: 205 10*3/uL (ref 150–440)
RBC: 3.28 MIL/uL — ABNORMAL LOW (ref 3.80–5.20)
RDW: 15.4 % — AB (ref 11.5–14.5)
WBC: 9.6 10*3/uL (ref 3.6–11.0)

## 2017-08-08 LAB — BASIC METABOLIC PANEL
Anion gap: 7 (ref 5–15)
BUN: 19 mg/dL (ref 6–20)
CALCIUM: 8.2 mg/dL — AB (ref 8.9–10.3)
CO2: 25 mmol/L (ref 22–32)
Chloride: 101 mmol/L (ref 101–111)
Creatinine, Ser: 0.57 mg/dL (ref 0.44–1.00)
GFR calc non Af Amer: >60 mL/min (ref >60)
Glucose, Bld: 93 mg/dL (ref 65–99)
Potassium: 3.5 mmol/L (ref 3.5–5.1)
SODIUM: 133 mmol/L — AB (ref 135–145)

## 2017-08-08 LAB — GLUCOSE, CAPILLARY
GLUCOSE-CAPILLARY: 82 mg/dL (ref 65–99)
GLUCOSE-CAPILLARY: 119 mg/dL — AB (ref 65–99)
Glucose-Capillary: 117 mg/dL — ABNORMAL HIGH (ref 65–99)

## 2017-08-08 LAB — URINE CULTURE: CULTURE: NO GROWTH

## 2017-08-08 LAB — PROCALCITONIN: PROCALCITONIN: 4.81 ng/mL

## 2017-08-08 MED ORDER — ENSURE ENLIVE PO LIQD
237.0000 mL | Freq: Two times a day (BID) | ORAL | Status: DC
  Administered 2017-08-09 – 2017-08-10 (×4): 237 mL via ORAL

## 2017-08-08 MED ORDER — IOPAMIDOL (ISOVUE-300) INJECTION 61%
15.0000 mL | INTRAVENOUS | Status: AC
  Administered 2017-08-08 (×2): 15 mL via ORAL

## 2017-08-08 MED ORDER — IOPAMIDOL (ISOVUE-300) INJECTION 61%
100.0000 mL | Freq: Once | INTRAVENOUS | Status: AC | PRN
  Administered 2017-08-08: 22:00:00 100 mL via INTRAVENOUS

## 2017-08-08 MED ORDER — POTASSIUM CHLORIDE CRYS ER 20 MEQ PO TBCR
40.0000 meq | EXTENDED_RELEASE_TABLET | Freq: Once | ORAL | Status: AC
  Administered 2017-08-08: 40 meq via ORAL
  Filled 2017-08-08: qty 2

## 2017-08-08 MED ORDER — METOPROLOL TARTRATE 50 MG PO TABS
75.0000 mg | ORAL_TABLET | Freq: Two times a day (BID) | ORAL | Status: DC
  Administered 2017-08-08 – 2017-08-10 (×4): 75 mg via ORAL
  Filled 2017-08-08 (×5): qty 1

## 2017-08-08 NOTE — [place] FL2 (Signed)
[place] LEVEL OF CARE SCREENING TOOL     IDENTIFICATION  Patient Name: Regina Hood Birthdate: 1940/04/28 Sex: female Admission Date (Current Location): 08/06/2017  [place] and [place] Number:  Engineering geologist and Address:  [facility], [address], [place], [place] [address]      Provider Number: [id number]  Attending Physician Name and Address:  Saundra Shelling, MD  Relative Name and Phone Number:       Current Level of Care: Hospital Recommended Level of Care: [place] Prior Approval Number:    Date Approved/Denied:   PASRR Number: [id number])  Discharge Plan: SNF    Current Diagnoses: Patient Active Problem List   Diagnosis Date Noted  . Malnutrition of moderate degree 06/09/2017  . Generalized abdominal pain   . Hyperbilirubinemia   . Hypomagnesemia   . Hypokalemia 04/27/2017  . Proteinuria 03/31/2017  . COPD exacerbation ([place]) 03/16/2017  . Dehydration 01/19/2017  . History of iron deficiency anemia 12/29/2016  . Systemic lupus erythematosus ([place]) 12/29/2016  . Cholangiocarcinoma ([place]) 12/29/2016  . History of ulcerative colitis 12/29/2016  . Goals of care, counseling/discussion 12/29/2016  . Sepsis ([place]) 11/13/2016  . At risk for sepsis 09/29/2016  . Red blood cell antibody positive, compatible PRBC difficult to obtain 09/22/2016  . Type 2 diabetes mellitus without complication, without long-term current use of insulin ([place]) 09/17/2016  . Liver mass, left lobe 08/23/2016  . Elevated liver enzymes 08/19/2016  . Pruritic rash 08/19/2016  . IDA (iron deficiency anemia) 07/18/2016  . Cellulitis of right upper extremity 07/05/2016  . History of rectal bleeding 07/05/2016  . Rectal bleeding 06/29/2016  . Hypertension 01/07/2016  . Anxiety and depression 09/26/2013  . DDD (degenerative disc disease) 09/26/2013  . Diabetes ([place]) 09/26/2013  . Sarcoidosis 09/26/2013  . UC (ulcerative colitis)  ([place]) 09/26/2013  . Proximal humerus fracture, left, closed, initial encounter 09/17/2011    Orientation RESPIRATION BLADDER Height & Weight     Self, Time, Situation, Place  O2(3 Liters Oxygen. ) Continent Weight: 151 lb (68.5 kg) Height:  5' 2"  (157.5 cm)  BEHAVIORAL SYMPTOMS/MOOD NEUROLOGICAL BOWEL NUTRITION STATUS      Continent Diet(Diet: Carb Modified. )  AMBULATORY STATUS COMMUNICATION OF NEEDS Skin   Extensive Assist Verbally Normal                       Personal Care Assistance Level of Assistance  Bathing, Feeding, Dressing Bathing Assistance: Limited assistance Feeding assistance: Independent Dressing Assistance: Limited assistance     Functional Limitations Info  Sight, Hearing, Speech Sight Info: Adequate Hearing Info: Adequate Speech Info: Adequate    SPECIAL CARE FACTORS FREQUENCY  PT (By licensed PT), OT (By licensed OT)     PT Frequency: (5) OT Frequency: (5)            Contractures      Additional Factors Info  Code Status, Allergies Code Status Info: (DNR ) Allergies Info: (Penicillins)           Current Medications (08/08/2017):  This is the current hospital active medication list Current Facility-Administered Medications  Medication Dose Route Frequency Provider Last Rate Last Dose  . acetaminophen (TYLENOL) tablet 650 mg  650 mg Oral Q6H PRN Lance Coon, MD   650 mg at 08/08/17 0600   Or  . acetaminophen (TYLENOL) suppository 650 mg  650 mg Rectal Q6H PRN Lance Coon, MD      . azaTHIOprine Ilean Skill) tablet  50 mg  50 mg Oral Daily Lance Coon, MD   50 mg at 08/08/17 0851  . enoxaparin (LOVENOX) injection 40 mg  40 mg Subcutaneous Q24H Lance Coon, MD   40 mg at 08/07/17 1945  . gabapentin (NEURONTIN) capsule 100 mg  100 mg Oral BID Lance Coon, MD   100 mg at 08/08/17 9675  . hydroxychloroquine (PLAQUENIL) tablet 200 mg  200 mg Oral BID Lance Coon, MD   200 mg at 08/08/17 0851  . insulin aspart (novoLOG) injection 0-5  Units  0-5 Units Subcutaneous QHS Lance Coon, MD      . insulin aspart (novoLOG) injection 0-9 Units  0-9 Units Subcutaneous TID WC Lance Coon, MD   1 Units at 08/07/17 1229  . ipratropium-albuterol (DUONEB) 0.5-2.5 (3) MG/3ML nebulizer solution 3 mL  3 mL Nebulization TID Saundra Shelling, MD   3 mL at 08/08/17 1315  . LORazepam (ATIVAN) tablet 0.5 mg  0.5 mg Oral Q6H PRN Lance Coon, MD   0.5 mg at 08/07/17 1541  . meropenem (MERREM) 1 g in sodium chloride 0.9 % 100 mL IVPB  1 g Intravenous Driscilla Moats, MD 200 mL/hr at 08/08/17 1227 1 g at 08/08/17 1227  . metoprolol tartrate (LOPRESSOR) injection 5 mg  5 mg Intravenous Q4H PRN Saundra Shelling, MD   5 mg at 08/07/17 1947  . metoprolol tartrate (LOPRESSOR) tablet 75 mg  75 mg Oral BID Pyreddy, Reatha Harps, MD      . metroNIDAZOLE (FLAGYL) tablet 500 mg  500 mg Oral TID Lance Coon, MD   500 mg at 08/08/17 9163  . mometasone-formoterol (DULERA) 100-5 MCG/ACT inhaler 2 puff  2 puff Inhalation BID Lance Coon, MD   2 puff at 08/07/17 1945  . morphine 4 MG/ML injection 4 mg  4 mg Intravenous Q4H PRN Lance Coon, MD      . ondansetron [facility]) tablet 4 mg  4 mg Oral Q6H PRN Lance Coon, MD       Or  . ondansetron [facility]) injection 4 mg  4 mg Intravenous Q6H PRN Lance Coon, MD      . oxyCODONE (Oxy IR/ROXICODONE) immediate release tablet 5 mg  5 mg Oral Q6H PRN Lance Coon, MD   5 mg at 08/08/17 1218  . pantoprazole (PROTONIX) EC tablet 40 mg  40 mg Oral Daily Lance Coon, MD   40 mg at 08/08/17 0851  . predniSONE (DELTASONE) tablet 5 mg  5 mg Oral Q breakfast Lance Coon, MD   5 mg at 08/08/17 8466  . prochlorperazine (COMPAZINE) injection 5 mg  5 mg Intravenous Q4H PRN Lance Coon, MD      . vancomycin (VANCOCIN) IVPB 1000 mg/200 mL premix  1,000 mg Intravenous Q18H Lance Coon, MD   Stopped at 08/08/17 0559  . venlafaxine XR (EFFEXOR-XR) 24 hr capsule 225 mg  225 mg Oral Daily Lance Coon, MD   225 mg at 08/08/17 5993    Facility-Administered Medications Ordered in Other Encounters  Medication Dose Route Frequency Provider Last Rate Last Dose  . 0.9 %  sodium chloride infusion   Intravenous PRN Earlie Server, MD   Stopped at 04/27/17 1400  . heparin lock flush 100 unit/mL  500 Units Intravenous Once Earlie Server, MD         Discharge Medications: Please see discharge summary for a list of discharge medications.  Relevant Imaging Results:  Relevant Lab Results:   Additional Information (SSN: [id number])  Cadance Raus, Veronia Beets, LCSW

## 2017-08-08 NOTE — Clinical Social Work Note (Signed)
Clinical Social Work Assessment  Patient Details  Name: Regina Hood MRN: [id number] Date of Birth: Jun 07, 1940  Date of referral:  08/08/17               Reason for consult:  Facility Placement                Permission sought to share information with:  Chartered certified accountant granted to share information::  Yes, Verbal Permission Granted  Name::      [place]::   [place]   Relationship::     Contact Information:     Housing/Transportation Living arrangements for the past 2 months:  [place] of Information:  Patient Patient Interpreter Needed:  None Criminal Activity/Legal Involvement Pertinent to Current Situation/Hospitalization:  No - Comment as needed Significant Relationships:  Adult Children Lives with:  Self Do you feel safe going back to the place where you live?  Yes Need for family participation in patient care:  Yes (Comment)  Care giving concerns: Patient lives alone in [place] and is followed by outpatient palliative care.    Social Worker assessment / plan: [place] representative (CSW) received verbal consult from RN case manager that PT is recommending SNF. CSW met with patient alone at bedside to address consult. Patient was alert and oriented X4 and was laying in the bed. CSW introduced self and explained role of CSW department. Patient reported that she lives alone in [place] and wants to go to [facility]. CSW explained that [facility] will have to approve SNF. FL2 complete and faxed out.  CSW presented bed offers to patient. She chose [facility]. Per patient she can bring her chemo medication Azthioprine from home to [facility] as requested by [facility]. [facility] admissions coordinator at [facility] is aware of above. [facility] SNF authorization has been started. CSW will continue to follow and assist as needed.   Employment status:  Disabled (Comment on whether or  not currently receiving Disability), Retired Nurse, adult PT Recommendations:  Not assessed at this time Information / Referral to community resources:  [place]  Patient/Family's Response to care: Patient is agreeable to D/C to [facility].   Patient/Family's Understanding of and Emotional Response to Diagnosis, Current Treatment, and Prognosis: Patient was very pleasant and thanked CSW for assistance.   Emotional Assessment Appearance:  Appears stated age Attitude/Demeanor/Rapport:    Affect (typically observed):  Accepting, Adaptable, Pleasant Orientation:  Oriented to Self, Oriented to Place, Oriented to  Time, Oriented to Situation Alcohol / Substance use:  Not Applicable Psych involvement (Current and /or in the community):  No (Comment)  Discharge Needs  Concerns to be addressed:  Discharge Planning Concerns Readmission within the last 30 days:  No Current discharge risk:  Dependent with Mobility Barriers to Discharge:  Continued Medical Work up   [employer], Veronia Beets, LCSW 08/08/2017, 6:17 PM

## 2017-08-08 NOTE — Clinical Social Work Placement (Signed)
   CLINICAL SOCIAL WORK PLACEMENT  NOTE  Date:  08/08/2017  Patient Details  Name: Regina Hood MRN: [id number] Date of Birth: 09/14/1940  Clinical Social Work is seeking post-discharge placement for this patient at the [place] level of care (*CSW will initial, date and re-position this form in  chart as items are completed):  Yes   Patient/family provided with [place] Work Department's list of facilities offering this level of care within the geographic area requested by the patient (or if unable, by the patient's family).  Yes   Patient/family informed of their freedom to choose among providers that offer the needed level of care, that participate in Medicare, Medicaid or managed care program needed by the patient, have an available bed and are willing to accept the patient.  Yes   Patient/family informed of [place]'s ownership interest in [facility] and [facility], as well as of the fact that they are under no obligation to receive care at these facilities.  PASRR submitted to EDS on 08/08/17     PASRR number received on 08/08/17     Existing PASRR number confirmed on       FL2 transmitted to all facilities in geographic area requested by pt/family on 08/08/17     FL2 transmitted to all facilities within larger geographic area on       Patient informed that his/her managed care company has contracts with or will negotiate with certain facilities, including the following:        Yes   Patient/family informed of bed offers received.  Patient chooses bed at [facility] )     Physician recommends and patient chooses bed at      Patient to be transferred to   on  .  Patient to be transferred to facility by       Patient family notified on   of transfer.  Name of family member notified:        PHYSICIAN       Additional Comment:    _______________________________________________ Regina Hood, Veronia Beets, LCSW 08/08/2017,  6:16 PM

## 2017-08-08 NOTE — Progress Notes (Addendum)
Initial Nutrition Assessment  DOCUMENTATION CODES:   Non-severe (moderate) malnutrition in context of chronic illness  INTERVENTION:   Ensure Enlive po BID, each supplement provides 350 kcal and 20 grams of protein  Recommend liberalize diet from carb modified to regular  NUTRITION DIAGNOSIS:   Moderate Malnutrition related to chronic illness(stage IV cholangiocarcinoma, COPD, lupus) as evidenced by moderate fat depletion, moderate muscle depletion.  GOAL:   Patient will meet greater than or equal to 90% of their needs  MONITOR:   PO intake, Supplement acceptance, Labs, I & O's, Weight trends  REASON FOR ASSESSMENT:   Malnutrition Screening Tool   ASSESSMENT:  77 y.o. female with PMHx of diverticulitis, HTN, GERD, arthritis, COPD, DM type 2, depression, UC, lupus, DDD, stage IV cholangiocarcinoma (chemotherapy on hold for 8 weeks per pt) presents with malaise and chills for the past several days.  She presents to the ED 4/20 and is found to be febrile with leukocytosis and tachycardia.  Sepsis protocol initiated. Unclear source of infection at this time, though she does have a biliary drain and there is strong suspicion she may have infection there versus bacteremia.  No pneumonia seen on x-ray and UA was not suspicious for infection.  Met with pt at bedside today. This pt was seen on 06/08/17 by RD Willey Blade for MST. Pt was visibly in pain at time of visit and said she didn't feel good.  Per chart, pt consumed 30% of breakfast today. At time of visit, lunch tray was on counter untouched. Pt and pt's nurse both reported pt has poor appetite and po intake since admit. Noted pt is on carb mod diet. RD will liberalize to regular as pt's blood glucose levels are within reason.   Pt reports poor appetite and po intake for 2 weeks PTA. She reports consuming ~1 meal/day. Pt reported continued consumption of 1 Boost Plus/day at home. Educated pt on increased protein and calorie needs  d/t cancer dx and COPD. Pt verbalized understanding. Pt is willing to try chocolate ensure supplement.  Per chart, pt has 4 lb (2.6%) weight loss in two weeks and 2 lb (1.3%) weight loss in 1 month- these are not significant per time frame. Pt reports UBW ~150 lbs. Pt states feeling like she's lost ~5 lbs recently- she is unsure of time frame. Pt was sitting up on side of bed- unable to obtain bed weight at this visit.  Pt reports no issues chewing or swallowing at this time. She reported last BM this morning.  Noted low RBC, Hemoglobin, HCT levels and elevated RDW level. Will continue to monitor iron levels.  Medications reviewed and include: enoxaparin, gabapentin, hydroxychloroquine, novolog, metoprolol tartrate, metronidazole, pantoprazole, prednisone, meropenem, vancomycin, oxycodone  Labs reviewed: Na 133(L), RBC 3.28 (L), Hemoglobin 9.4(L), HCT 28.0(L), RDW 15.4(H), WBC 9.6 wnl, CA 19-9 69(H) (4/17) Iron 46 wnl (3/19)  NUTRITION - FOCUSED PHYSICAL EXAM:    Most Recent Value  Orbital Region  Mild depletion  Upper Arm Region  Moderate depletion  Thoracic and Lumbar Region  Unable to assess  Buccal Region  Moderate depletion  Temple Region  Moderate depletion  Clavicle Bone Region  No depletion  Clavicle and Acromion Bone Region  No depletion  Scapular Bone Region  No depletion  Dorsal Hand  Mild depletion  Patellar Region  Mild depletion  Anterior Thigh Region  Mild depletion  Posterior Calf Region  Moderate depletion  Edema (RD Assessment)  None  Hair  Reviewed  Eyes  Reviewed  Mouth  Reviewed  Skin  Reviewed [brusing on bilateral arms,  bruising/rashes on bilateral legs]  Nails  Reviewed     Diet Order:  Diet Carb Modified Fluid consistency: Thin; Room service appropriate? Yes  EDUCATION NEEDS:   Education needs have been addressed  Skin:  Skin Assessment: Reviewed RN Assessment  Last BM:  08/08/17(per patient)  Height:   Ht Readings from Last 1 Encounters:   08/06/17 5' 2"  (1.575 m)   Weight:   Wt Readings from Last 1 Encounters:  08/06/17 151 lb (68.5 kg)    Ideal Body Weight:  50 kg  BMI:  Body mass index is 27.62 kg/m.  Estimated Nutritional Needs:   Kcal:  1500-1700 kcals/day (MSJ ABW x 1.3-1.5)  Protein:  82-103 g/kg/day (1.2-1.5)  Fluid:  >1.5 L/day (1kcal/mL)  Alfonse Ras, [place] Dietetic Intern [phone]

## 2017-08-08 NOTE — Consult Note (Signed)
[facility] Infectious Disease     Reason for Consult: Sepsis  Referring Physician: Neta Mends Date of Admission:  08/06/2017   Principal Problem:   Sepsis ([place]) Active Problems:   Systemic lupus erythematosus ([place])   Cholangiocarcinoma ([place])   Anxiety and depression   Hypertension   Sarcoidosis   Type 2 diabetes mellitus without complication, without long-term current use of insulin (HCC)   HPI: OLISA QUESNEL is a 77 y.o. female With a complicated history of cholangiocarcinoma now admitted with malaise chills for several days.  On admission she has temperature of 103 and white count of 13.  She was started empirically on aztreonam and vancomycin levofloxacin.  Currently she is on meropenem and metronidazole.  She had blood cultures done 420 that are negative and a urine culture that is negative.  She does have biliary drains in place.  Chest x-ray shows some basilar interstitial lung cardiomegaly.  Her bilirubin has been stable but alk phos elevated at 241.  She does have biliary drains in place.  Procalcitonin was elevated at 0.45 but has increased to 4.81.  Lactic acid was initially elevated as well.  Flu PCR was negative urinalysis was negative.  She is currently on BiPAP for acute respiratory failure.  She is on immunosuppression for lupus.  She is  Past Medical History:  Diagnosis Date  . Arthritis   . Cancer ([place])   . Cellulitis   . Collagen vascular disease ([place])   . COPD (chronic obstructive pulmonary disease) ([place])   . DDD (degenerative disc disease), lumbar   . Depression   . Diabetes mellitus   . Diverticulitis   . GERD (gastroesophageal reflux disease)   . GI bleed   . Headache(784.0)   . Hypertension   . Lupus ([place])   . Ulcerative colitis [facility])    Past Surgical History:  Procedure Laterality Date  . ABDOMINAL HYSTERECTOMY    . BREAST BIOPSY Right   . COLONOSCOPY    . COLONOSCOPY WITH PROPOFOL N/A 08/18/2016   Procedure: COLONOSCOPY WITH PROPOFOL;  Surgeon:  Manya Silvas, MD;  Location[facility] END[facility]COPY;  Service: Endoscopy;  Laterality: N/A;  . ESOPHAGOGASTRODUODENOSCOPY (EGD) WITH PROPOFOL N/A 08/18/2016   Procedure: ESOPHAGOGASTRODUODENOSCOPY (EGD) WITH PROPOFOL;  Surgeon: Manya Silvas, MD;  Location: [facility] ENDOSCOPY;  Service: Endoscopy;  Laterality: N/A;  . IR BILIARY DRAIN PLACEMENT WITH CHOLANGIOGRAM  06/08/2017  . ORIF HUMERUS FRACTURE  09/17/2011   Procedure: OPEN REDUCTION INTERNAL FIXATION (ORIF) PROXIMAL HUMERUS FRACTURE;  Surgeon: Augustin Schooling, MD;  Location: [facility];  Service: Orthopedics;  Laterality: Left;   Social History   Tobacco Use  . Smoking status: Former Smoker    Packs/day: 1.00    Years: 10.00    Pack years: 10.00    Types: Cigarettes    Last attempt to quit: 12/30/1998    Years since quitting: 18.6  . Smokeless tobacco: Never Used  . Tobacco comment: quit smoking in 2003  Substance Use Topics  . Alcohol use: No  . Drug use: No   Family History  Problem Relation Age of Onset  . Breast cancer Mother 48  . COPD Father   . Prostate cancer Brother     Allergies:  Allergies  Allergen Reactions  . Penicillins Nausea Only    Current antibiotics: Antibiotics Given (last 72 hours)    Date/Time Action Medication Dose Rate   08/06/17 2342 New Bag/Given   levofloxacin (LEVAQUIN) IVPB 750 mg 750 mg 100 mL/hr  08/06/17 2343 New Bag/Given   aztreonam (AZACTAM) 2 g in sodium chloride 0.9 % 100 mL IVPB 2 g 200 mL/hr   08/06/17 2354 New Bag/Given   vancomycin (VANCOCIN) IVPB 1000 mg/200 mL premix 1,000 mg 200 mL/hr   08/07/17 0428 New Bag/Given   meropenem (MERREM) 1 g in sodium chloride 0.9 % 100 mL IVPB 1 g 200 mL/hr   08/07/17 0942 Given   hydroxychloroquine (PLAQUENIL) tablet 200 mg 200 mg    08/07/17 0942 Given   metroNIDAZOLE (FLAGYL) tablet 500 mg 500 mg    08/07/17 1118 New Bag/Given   meropenem (MERREM) 1 g in sodium chloride 0.9 % 100 mL IVPB 1 g 200 mL/hr   08/07/17 1118 New Bag/Given    vancomycin (VANCOCIN) IVPB 1000 mg/200 mL premix 1,000 mg 200 mL/hr   08/07/17 1649 Given   metroNIDAZOLE (FLAGYL) tablet 500 mg 500 mg    08/07/17 2053 New Bag/Given   meropenem (MERREM) 1 g in sodium chloride 0.9 % 100 mL IVPB 1 g 200 mL/hr   08/07/17 2203 Given   hydroxychloroquine (PLAQUENIL) tablet 200 mg 200 mg    08/07/17 2203 Given   metroNIDAZOLE (FLAGYL) tablet 500 mg 500 mg    08/08/17 0315 New Bag/Given   meropenem (MERREM) 1 g in sodium chloride 0.9 % 100 mL IVPB 1 g 200 mL/hr   08/08/17 0506 New Bag/Given   vancomycin (VANCOCIN) IVPB 1000 mg/200 mL premix 1,000 mg 200 mL/hr   08/08/17 0851 Given   hydroxychloroquine (PLAQUENIL) tablet 200 mg 200 mg    08/08/17 1062 Given   metroNIDAZOLE (FLAGYL) tablet 500 mg 500 mg    08/08/17 1227 New Bag/Given   meropenem (MERREM) 1 g in sodium chloride 0.9 % 100 mL IVPB 1 g 200 mL/hr      MEDICATIONS: . azaTHIOprine  50 mg Oral Daily  . enoxaparin (LOVENOX) injection  40 mg Subcutaneous Q24H  . gabapentin  100 mg Oral BID  . hydroxychloroquine  200 mg Oral BID  . insulin aspart  0-5 Units Subcutaneous QHS  . insulin aspart  0-9 Units Subcutaneous TID WC  . ipratropium-albuterol  3 mL Nebulization TID  . metoprolol tartrate  75 mg Oral BID  . metroNIDAZOLE  500 mg Oral TID  . mometasone-formoterol  2 puff Inhalation BID  . pantoprazole  40 mg Oral Daily  . predniSONE  5 mg Oral Q breakfast  . venlafaxine XR  225 mg Oral Daily    Review of Systems - 11 systems reviewed and negative per HPI   OBJECTIVE: Temp:  [97.7 F (36.5 C)-101.1 F (38.4 C)] 97.7 F (36.5 C) (04/22 0444) Pulse Rate:  [95-147] 110 (04/22 0851) Resp:  [14-20] 18 (04/22 0444) BP: (113-181)/(74-93) 128/76 (04/22 0851) SpO2:  [92 %-99 %] 92 % (04/22 1318) Physical Exam  Constitutional:  Ill appearing, lethargic but sitting on commode. On O2 HENT: [place]/AT, PERRLA, no scleral icterus Mouth/Throat: Oropharynx is clear and dry . No oropharyngeal exudate.   Cardiovascular: Normal rate, regular rhythm and normal heart sounds. Pulmonary/Chest: bibasilar crackles and rhonchi, poor air movement R hest Portacath wnl  Neck = supple, no nuchal rigidity Abdominal: distended, mild diffuse tt. Drain on R upper abd Lymphadenopathy: no cervical adenopathy. No axillary adenopathy Neurological: lethargic  Skin: diffuse skin changes with scaly eruption from her lupus/ Psychiatric: a normal mood and affect.  behavior is normal.    LABS: Results for orders placed or performed during the hospital encounter of 08/06/17 (from the past  48 hour(s))  Blood Culture (routine x 2)     Status: None (Preliminary result)   Collection Time: 08/06/17 11:28 PM  Result Value Ref Range   Specimen Description BLOOD BLOOD RIGHT HAND    Special Requests      BOTTLES DRAWN AEROBIC AND ANAEROBIC Blood Culture adequate volume   Culture      NO GROWTH 2 DAYS Performed at [facility], [address], [place], [place] [address]    Report Status PENDING   Blood Culture (routine x 2)     Status: None (Preliminary result)   Collection Time: 08/06/17 11:28 PM  Result Value Ref Range   Specimen Description BLOOD BLOOD LEFT HAND    Special Requests      BOTTLES DRAWN AEROBIC AND ANAEROBIC Blood Culture adequate volume   Culture      NO GROWTH 2 DAYS Performed at [facility] [address], [place], [place] [address]    Report Status PENDING   Urinalysis, Routine w reflex microscopic     Status: Abnormal   Collection Time: 08/06/17 11:28 PM  Result Value Ref Range   Color, Urine AMBER (A) YELLOW    Comment: BIOCHEMICALS MAY BE AFFECTED BY COLOR   APPearance CLEAR (A) CLEAR   Specific Gravity, Urine 1.016 1.005 - 1.030   pH 5.0 5.0 - 8.0   Glucose, UA NEGATIVE NEGATIVE mg/dL   Hgb urine dipstick MODERATE (A) NEGATIVE   Bilirubin Urine NEGATIVE NEGATIVE   Ketones, ur 5 (A) NEGATIVE mg/dL   Protein, ur 100 (A) NEGATIVE mg/dL   Nitrite NEGATIVE  NEGATIVE   Leukocytes, UA NEGATIVE NEGATIVE   RBC / HPF 0-5 0 - 5 RBC/hpf   WBC, UA 0-5 0 - 5 WBC/hpf   Bacteria, UA RARE (A) NONE SEEN   Squamous Epithelial / LPF 0-5 (A) NONE SEEN   Mucus PRESENT    Hyaline Casts, UA PRESENT     Comment: Performed at [facility], [address] [place], [place] [address]  Urine culture     Status: None   Collection Time: 08/06/17 11:28 PM  Result Value Ref Range   Specimen Description      URINE, RANDOM Performed at [facility], [address], [place], [place] [address]    Special Requests      NONE Performed at [facility], [address] [place], [place] [address]    Culture      NO GROWTH Performed at [facility], [facility] [address], [place], [place] [address]    Report Status 08/08/2017 FINAL   Lactic acid, plasma     Status: Abnormal   Collection Time: 08/06/17 11:29 PM  Result Value Ref Range   Lactic Acid, Venous 5.3 (HH) 0.5 - 1.9 mmol/L    Comment: CRITICAL RESULT CALLED TO, READ BACK BY AND VERIFIED WITH REBECCA LYNN AT 0004 08/07/17.PMH Performed at [facility], [address] [place], [place] [address]   Comprehensive metabolic panel     Status: Abnormal   Collection Time: 08/06/17 11:29 PM  Result Value Ref Range   Sodium 130 (L) 135 - 145 mmol/L   Potassium 3.7 3.5 - 5.1 mmol/L   Chloride 98 (L) 101 - 111 mmol/L   CO2 21 (L) 22 - 32 mmol/L   Glucose, Bld 178 (H) 65 - 99 mg/dL   BUN 13 6 - 20 mg/dL   Creatinine, Ser 0.79 0.44 - 1.00 mg/dL   Calcium 8.5 (L)  8.9 - 10.3 mg/dL   Total Protein 6.8 6.5 - 8.1 g/dL   Albumin 3.1 (L) 3.5 - 5.0 g/dL   AST 58 (H) 15 - 41 U/L   ALT 29 14 - 54 U/L   Alkaline Phosphatase 241 (H) 38 - 126 U/L   Total Bilirubin 0.6 0.3 - 1.2 mg/dL   GFR calc non Af Amer >60 >60 mL/min   GFR calc Af Amer >60 >60 mL/min    Comment: (NOTE) The eGFR has been calculated using the CKD EPI equation. This calculation has not been validated in all clinical  situations. eGFR's persistently <60 mL/min signify possible Chronic Kidney Disease.    Anion gap 11 5 - 15    Comment: Performed at [facility], [address] [place], [place] [address]  Lipase, blood     Status: None   Collection Time: 08/06/17 11:29 PM  Result Value Ref Range   Lipase 23 11 - 51 U/L    Comment: Performed at [facility], [address] [place], [place] [address]  Troponin I     Status: Abnormal   Collection Time: 08/06/17 11:29 PM  Result Value Ref Range   Troponin I 0.03 (HH) <0.03 ng/mL    Comment: CRITICAL RESULT CALLED TO, READ BACK BY AND VERIFIED WITH REBECCA LYNN AT 0004 08/07/17.PMH Performed at [facility], [address] [place], [place] [address]   CBC WITH DIFFERENTIAL     Status: Abnormal   Collection Time: 08/06/17 11:29 PM  Result Value Ref Range   WBC 13.1 (H) 3.6 - 11.0 K/uL   RBC 3.64 (L) 3.80 - 5.20 MIL/uL   Hemoglobin 10.1 (L) 12.0 - 16.0 g/dL   HCT 31.1 (L) 35.0 - 47.0 %   MCV 85.3 80.0 - 100.0 fL   MCH 27.8 26.0 - 34.0 pg   MCHC 32.6 32.0 - 36.0 g/dL   RDW 15.3 (H) 11.5 - 14.5 %   Platelets 231 150 - 440 K/uL   Neutrophils Relative % 93 %   Neutro Abs 12.0 (H) 1.4 - 6.5 K/uL   Lymphocytes Relative 1 %   Lymphs Abs 0.1 (L) 1.0 - 3.6 K/uL   Monocytes Relative 6 %   Monocytes Absolute 0.8 0.2 - 0.9 K/uL   Eosinophils Relative 0 %   Eosinophils Absolute 0.1 0 - 0.7 K/uL   Basophils Relative 0 %   Basophils Absolute 0.0 0 - 0.1 K/uL    Comment: Performed at [facility], [address] [place], [place] [address]  Procalcitonin     Status: None   Collection Time: 08/06/17 11:29 PM  Result Value Ref Range   Procalcitonin 0.45 ng/mL    Comment:        Interpretation: PCT (Procalcitonin) <= 0.5 ng/mL: Systemic infection (sepsis) is not likely. Local bacterial infection is possible. (NOTE)       Sepsis PCT Algorithm           Lower Respiratory Tract                                       Infection PCT Algorithm    ----------------------------     ----------------------------         PCT < 0.25 ng/mL                PCT < 0.10 ng/mL  Strongly encourage             Strongly discourage   discontinuation of antibiotics    initiation of antibiotics    ----------------------------     -----------------------------       PCT 0.25 - 0.50 ng/mL            PCT 0.10 - 0.25 ng/mL               OR       >80% decrease in PCT            Discourage initiation of                                            antibiotics      Encourage discontinuation           of antibiotics    ----------------------------     -----------------------------         PCT >= 0.50 ng/mL              PCT 0.26 - 0.50 ng/mL               AND        <80% decrease in PCT             Encourage initiation of                                             antibiotics       Encourage continuation           of antibiotics    ----------------------------     -----------------------------        PCT >= 0.50 ng/mL                  PCT > 0.50 ng/mL               AND         increase in PCT                  Strongly encourage                                      initiation of antibiotics    Strongly encourage escalation           of antibiotics                                     -----------------------------                                           PCT <= 0.25 ng/mL                                                 OR                                        >  80% decrease in PCT                                     Discontinue / Do not initiate                                             antibiotics Performed at [facility], [address] [place], [place] [address]   Protime-INR     Status: None   Collection Time: 08/06/17 11:29 PM  Result Value Ref Range   Prothrombin Time 14.0 11.4 - 15.2 seconds   INR 1.09     Comment: Performed at [facility], [address] [place], [place] [address]   Blood gas, venous     Status: Abnormal   Collection Time: 08/06/17 11:30 PM  Result Value Ref Range   pH, Ven 7.37 7.250 - 7.430   pCO2, Ven 37 (L) 44.0 - 60.0 mmHg   pO2, Ven 38.0 32.0 - 45.0 mmHg   Bicarbonate 21.4 20.0 - 28.0 mmol/L   Acid-base deficit 3.5 (H) 0.0 - 2.0 mmol/L   O2 Saturation 70.2 %   Patient temperature 37.0    Collection site LINE    Sample type VENOUS     Comment: Performed at [facility], [address], [place], [place] [address]  Influenza panel by PCR (type A & B)     Status: None   Collection Time: 08/06/17 11:30 PM  Result Value Ref Range   Influenza A By PCR NEGATIVE NEGATIVE   Influenza B By PCR NEGATIVE NEGATIVE    Comment: (NOTE) The Xpert Xpress Flu assay is intended as an aid in the diagnosis of  influenza and should not be used as a sole basis for treatment.  This  assay is FDA approved for nasopharyngeal swab specimens only. Nasal  washings and aspirates are unacceptable for Xpert Xpress Flu testing. Performed at [facility], [address] [place], [place] [address]   Lactic acid, plasma     Status: Abnormal   Collection Time: 08/07/17  2:15 AM  Result Value Ref Range   Lactic Acid, Venous 2.2 (HH) 0.5 - 1.9 mmol/L    Comment: CRITICAL RESULT CALLED TO, READ BACK BY AND VERIFIED WITH [facility] AT 5638 08/07/17.PMH Performed at [facility], [address] [place], [place] [address]   Basic metabolic panel     Status: Abnormal   Collection Time: 08/07/17  4:05 AM  Result Value Ref Range   Sodium 130 (L) 135 - 145 mmol/L   Potassium 3.7 3.5 - 5.1 mmol/L   Chloride 102 101 - 111 mmol/L   CO2 24 22 - 32 mmol/L   Glucose, Bld 122 (H) 65 - 99 mg/dL   BUN 17 6 - 20 mg/dL   Creatinine, Ser 0.83 0.44 - 1.00 mg/dL   Calcium 7.7 (L) 8.9 - 10.3 mg/dL   GFR calc non Af Amer >60 >60 mL/min   GFR calc Af Amer >60 >60 mL/min    Comment: (NOTE) The eGFR has been calculated using the CKD EPI equation. This  calculation has not been validated in all clinical situations. eGFR's persistently <60 mL/min signify possible Chronic Kidney Disease.    Anion gap 4 (L) 5 - 15    Comment: Performed  at [facility], [address] [place], [place] [address]  CBC     Status: Abnormal   Collection Time: 08/07/17  4:05 AM  Result Value Ref Range   WBC 11.6 (H) 3.6 - 11.0 K/uL   RBC 3.05 (L) 3.80 - 5.20 MIL/uL   Hemoglobin 8.7 (L) 12.0 - 16.0 g/dL   HCT 26.3 (L) 35.0 - 47.0 %   MCV 86.0 80.0 - 100.0 fL   MCH 28.4 26.0 - 34.0 pg   MCHC 33.0 32.0 - 36.0 g/dL   RDW 15.4 (H) 11.5 - 14.5 %   Platelets 209 150 - 440 K/uL    Comment: Performed at [facility], [address] [place], [place] [address]  Troponin I     Status: Abnormal   Collection Time: 08/07/17  4:05 AM  Result Value Ref Range   Troponin I 0.03 (HH) <0.03 ng/mL    Comment: CRITICAL VALUE NOTED. VALUE IS CONSISTENT WITH PREVIOUSLY REPORTED/CALLED VALUE.PMH Performed at [facility], [address] [place], [place] [address]   Glucose, capillary     Status: None   Collection Time: 08/07/17  7:40 AM  Result Value Ref Range   Glucose-Capillary 97 65 - 99 mg/dL  Troponin I     Status: Abnormal   Collection Time: 08/07/17 10:20 AM  Result Value Ref Range   Troponin I 0.04 (HH) <0.03 ng/mL    Comment: CRITICAL VALUE NOTED. VALUE IS CONSISTENT WITH PREVIOUSLY REPORTED/CALLED VALUE  SDR Performed at [facility], [address] [place], [place] [address]   Lactic acid, plasma     Status: None   Collection Time: 08/07/17 11:28 AM  Result Value Ref Range   Lactic Acid, Venous 0.7 0.5 - 1.9 mmol/L    Comment: Performed at [facility], [address] [place], [place] [address]  Glucose, capillary     Status: Abnormal   Collection Time: 08/07/17 12:10 PM  Result Value Ref Range   Glucose-Capillary 127 (H) 65 - 99 mg/dL  Glucose, capillary     Status: Abnormal   Collection Time: 08/07/17   4:51 PM  Result Value Ref Range   Glucose-Capillary 107 (H) 65 - 99 mg/dL  Glucose, capillary     Status: Abnormal   Collection Time: 08/07/17  9:19 PM  Result Value Ref Range   Glucose-Capillary 103 (H) 65 - 99 mg/dL  CBC     Status: Abnormal   Collection Time: 08/08/17  4:56 AM  Result Value Ref Range   WBC 9.6 3.6 - 11.0 K/uL   RBC 3.28 (L) 3.80 - 5.20 MIL/uL   Hemoglobin 9.4 (L) 12.0 - 16.0 g/dL   HCT 28.0 (L) 35.0 - 47.0 %   MCV 85.4 80.0 - 100.0 fL   MCH 28.7 26.0 - 34.0 pg   MCHC 33.7 32.0 - 36.0 g/dL   RDW 15.4 (H) 11.5 - 14.5 %   Platelets 205 150 - 440 K/uL    Comment: Performed at [facility], [address], [place], [place] [address]  Basic metabolic panel     Status: Abnormal   Collection Time: 08/08/17  4:56 AM  Result Value Ref Range   Sodium 133 (L) 135 - 145 mmol/L   Potassium 3.5 3.5 - 5.1 mmol/L   Chloride 101 101 - 111 mmol/L   CO2 25 22 - 32 mmol/L   Glucose, Bld 93 65 - 99 mg/dL   BUN 19 6 - 20 mg/dL   Creatinine,  Ser 0.57 0.44 - 1.00 mg/dL   Calcium 8.2 (L) 8.9 - 10.3 mg/dL   GFR calc non Af Amer >60 >60 mL/min   GFR calc Af Amer >60 >60 mL/min    Comment: (NOTE) The eGFR has been calculated using the CKD EPI equation. This calculation has not been validated in all clinical situations. eGFR's persistently <60 mL/min signify possible Chronic Kidney Disease.    Anion gap 7 5 - 15    Comment: Performed at [facility], [place], [place] [address]  Procalcitonin - Baseline     Status: None   Collection Time: 08/08/17  5:00 AM  Result Value Ref Range   Procalcitonin 4.81 ng/mL    Comment:        Interpretation: PCT > 2 ng/mL: Systemic infection (sepsis) is likely, unless other causes are known. (NOTE)       Sepsis PCT Algorithm           Lower Respiratory Tract                                      Infection PCT Algorithm    ----------------------------     ----------------------------         PCT < 0.25 ng/mL                 PCT < 0.10 ng/mL         Strongly encourage             Strongly discourage   discontinuation of antibiotics    initiation of antibiotics    ----------------------------     -----------------------------       PCT 0.25 - 0.50 ng/mL            PCT 0.10 - 0.25 ng/mL               OR       >80% decrease in PCT            Discourage initiation of                                            antibiotics      Encourage discontinuation           of antibiotics    ----------------------------     -----------------------------         PCT >= 0.50 ng/mL              PCT 0.26 - 0.50 ng/mL               AND       <80% decrease in PCT              Encourage initiation of                                             antibiotics       Encourage continuation           of antibiotics    ----------------------------     -----------------------------        PCT >= 0.50 ng/mL  PCT > 0.50 ng/mL               AND         increase in PCT                  Strongly encourage                                      initiation of antibiotics    Strongly encourage escalation           of antibiotics                                     -----------------------------                                           PCT <= 0.25 ng/mL                                                 OR                                        > 80% decrease in PCT                                     Discontinue / Do not initiate                                             antibiotics Performed at [facility], [address] [place], [place] [address]   Glucose, capillary     Status: None   Collection Time: 08/08/17  7:34 AM  Result Value Ref Range   Glucose-Capillary 82 65 - 99 mg/dL  Glucose, capillary     Status: Abnormal   Collection Time: 08/08/17 12:17 PM  Result Value Ref Range   Glucose-Capillary 119 (H) 65 - 99 mg/dL   No components found for: ESR, C REACTIVE PROTEIN MICRO: Recent  Results (from the past 720 hour(s))  Blood Culture (routine x 2)     Status: None (Preliminary result)   Collection Time: 08/06/17 11:28 PM  Result Value Ref Range Status   Specimen Description BLOOD BLOOD RIGHT HAND  Final   Special Requests   Final    BOTTLES DRAWN AEROBIC AND ANAEROBIC Blood Culture adequate volume   Culture   Final    NO GROWTH 2 DAYS Performed at [facility], [address] [place],  [address]    Report Status PENDING  Incomplete  Blood Culture (routine x 2)     Status: None (Preliminary result)   Collection Time: 08/06/17 11:28 PM  Result Value Ref Range Status   Specimen Description BLOOD BLOOD LEFT HAND  Final   Special Requests   Final    BOTTLES DRAWN AEROBIC  AND ANAEROBIC Blood Culture adequate volume   Culture   Final    NO GROWTH 2 DAYS Performed at [facility], [address] [place], [place] [address]    Report Status PENDING  Incomplete  Urine culture     Status: None   Collection Time: 08/06/17 11:28 PM  Result Value Ref Range Status   Specimen Description   Final    URINE, RANDOM Performed at [facility], [address], [place], [place] [address]    Special Requests   Final    NONE Performed at [facility], [address], [place], [place] [address]    Culture   Final    NO GROWTH Performed at [facility], [address] [place], [place] [address]    Report Status 08/08/2017 FINAL  Final    IMAGING: Dg Chest Port 1 View  Result Date: 08/07/2017 CLINICAL DATA:  Short of breath and cough. Lupus. Hypertension. COPD. Diabetes. EXAM: PORTABLE CHEST 1 VIEW COMPARISON:  One day prior FINDINGS: Right Port-A-Cath terminates at the mid SVC. Midline trachea. Cardiomegaly accentuated by AP portable technique. No pleural effusion or pneumothorax. Basilar and peripheral predominant interstitial thickening is similar, given differences in technique. No well-defined lobar consolidation.  Surgical clips project over the right lung base. IMPRESSION: Basilar predominant interstitial thickening, likely all attributed to interstitial lung disease when correlated with 02/24/2017 CT. Cardiomegaly without convincing evidence of superimposed congestive failure. Electronically Signed   By: Abigail Miyamoto M.D.   On: 08/07/2017 07:44   Dg Chest Port 1 View  Result Date: 08/06/2017 CLINICAL DATA:  Sepsis EXAM: PORTABLE CHEST 1 VIEW COMPARISON:  04/29/2017 FINDINGS: Right Port-A-Cath is unchanged. Cardiomegaly with vascular congestion. Interstitial prominence throughout the lungs could reflect interstitial edema. Bibasilar atelectasis. No visible effusions. IMPRESSION: Cardiomegaly with vascular congestion and interstitial prominence, possibly representing interstitial edema. Bibasilar atelectasis. Electronically Signed   By: Rolm Baptise M.D.   On: 08/06/2017 23:49    Assessment:   IVELIZ GARAY is a 77 y.o. female with cholangiocarcinoma with two biliary drains in place admitted with malaise and fevers. She is ill appearing, hypoxic (but has ILD).  Interestingly lfts only mildly elevated, cxr shows chronic changes, flu pcr negative, ua negative. [place] pending.  She does have a portacath in place but it is not tender or inflamed.  I suspect biliary source of sepsis and would suggest imaging of abd.   Recommendations Cont vanco and meropenem pending blood culture CT abd pelvis Thank you very much for allowing me to participate in the care of this patient. Please call with questions.   Cheral Marker. Ola Spurr, MD

## 2017-08-08 NOTE — Progress Notes (Signed)
Please note patient is currently followed by outpatient PALLIATIVE at home. CMRN Brenda Holland made aware.  °Karen Robertson RN, BSN, CHPN °[facility], hospital liaison °[phone] °

## 2017-08-08 NOTE — Progress Notes (Signed)
[facility] [place] NAME: Regina Hood    MR#:  [id number]  DATE OF BIRTH:  12/26/1940  SUBJECTIVE:  CHIEF COMPLAINT:   Patient seen and evaluated today Decreased shortness of breath Off BiPAP Patient had runs of tachycardia yesterday No complaints of any chest pain  comfortable on oxygen via nasal cannula  REVIEW OF SYSTEMS:    ROS  CONSTITUTIONAL: Had fever. Has fatigue, weakness. No weight gain, no weight loss.  EYES: No blurry or double vision.  ENT: No tinnitus. No postnasal drip. No redness of the oropharynx.  RESPIRATORY: Has occasional cough, wheeze present, no hemoptysis. No dyspnea.  CARDIOVASCULAR: No chest pain. No orthopnea. No palpitations. No syncope.  GASTROINTESTINAL: No nausea, no vomiting or diarrhea. No abdominal pain. No melena or hematochezia.  GENITOURINARY: No dysuria or hematuria.  ENDOCRINE: No polyuria or nocturia. No heat or cold intolerance.  HEMATOLOGY: No anemia. No bruising. No bleeding.  INTEGUMENTARY: No rashes. No lesions.  MUSCULOSKELETAL: No arthritis. No swelling. No gout.  NEUROLOGIC: No numbness, tingling, or ataxia. No seizure-type activity.  PSYCHIATRIC: No anxiety. No insomnia. No ADD.   DRUG ALLERGIES:   Allergies  Allergen Reactions  . Penicillins Nausea Only    VITALS:  Blood pressure 128/76, pulse (!) 110, temperature 97.7 F (36.5 C), temperature source Oral, resp. rate 18, height 5' 2"  (1.575 m), weight 68.5 kg (151 lb), SpO2 98 %.  PHYSICAL EXAMINATION:   Physical Exam  GENERAL:  77 y.o.-year-old patient lying in the bed with respiratory distress EYES: Pupils equal, round, reactive to light and accommodation. No scleral icterus. Extraocular muscles intact.  HEENT: Head atraumatic, normocephalic. Oropharynx and nasopharynx clear.  NECK:  Supple, no jugular venous distention. No thyroid enlargement, no tenderness.  LUNGS: Decreased breath sounds bilaterally, bilateral wheezing  decreased. No use of accessory muscles of respiration.  CARDIOVASCULAR: S1, S2 tachycardia. No murmurs, rubs, or gallops.  ABDOMEN: Soft, mild tenderness around umbilicus, nondistended. Bowel sounds present. No organomegaly or mass.  EXTREMITIES: No cyanosis, clubbing or edema b/l.    NEUROLOGIC: Cranial nerves II through XII are intact. No focal Motor or sensory deficits b/l.   PSYCHIATRIC: The patient is alert and oriented x 3.  SKIN: No obvious rash, lesion, or ulcer.   LABORATORY PANEL:   CBC Recent Labs  Lab 08/08/17 0456  WBC 9.6  HGB 9.4*  HCT 28.0*  PLT 205   ------------------------------------------------------------------------------------------------------------------ Chemistries  Recent Labs  Lab 08/03/17 1353 08/06/17 2329  08/08/17 0456  NA 132* 130*   < > 133*  K 3.7 3.7   < > 3.5  CL 101 98*   < > 101  CO2 23 21*   < > 25  GLUCOSE 181* 178*   < > 93  BUN 19 13   < > 19  CREATININE 0.77 0.79   < > 0.57  CALCIUM 9.2 8.5*   < > 8.2*  MG 1.8  --   --   --   AST 38 58*  --   --   ALT 32 29  --   --   ALKPHOS 272* 241*  --   --   BILITOT 0.5 0.6  --   --    < > = values in this interval not displayed.   ------------------------------------------------------------------------------------------------------------------  Cardiac Enzymes Recent Labs  Lab 08/07/17 1020  TROPONINI 0.04*   ------------------------------------------------------------------------------------------------------------------  RADIOLOGY:  Dg Chest Port 1 View  Result Date: 08/07/2017 CLINICAL DATA:  Short of breath and cough. Lupus. Hypertension. COPD. Diabetes. EXAM: PORTABLE CHEST 1 VIEW COMPARISON:  One day prior FINDINGS: Right Port-A-Cath terminates at the mid SVC. Midline trachea. Cardiomegaly accentuated by AP portable technique. No pleural effusion or pneumothorax. Basilar and peripheral predominant interstitial thickening is similar, given differences in technique. No  well-defined lobar consolidation. Surgical clips project over the right lung base. IMPRESSION: Basilar predominant interstitial thickening, likely all attributed to interstitial lung disease when correlated with 02/24/2017 CT. Cardiomegaly without convincing evidence of superimposed congestive failure. Electronically Signed   By: Abigail Miyamoto M.D.   On: 08/07/2017 07:44   Dg Chest Port 1 View  Result Date: 08/06/2017 CLINICAL DATA:  Sepsis EXAM: PORTABLE CHEST 1 VIEW COMPARISON:  04/29/2017 FINDINGS: Right Port-A-Cath is unchanged. Cardiomegaly with vascular congestion. Interstitial prominence throughout the lungs could reflect interstitial edema. Bibasilar atelectasis. No visible effusions. IMPRESSION: Cardiomegaly with vascular congestion and interstitial prominence, possibly representing interstitial edema. Bibasilar atelectasis. Electronically Signed   By: Rolm Baptise M.D.   On: 08/06/2017 23:49     ASSESSMENT AND PLAN:   77 year old elderly female patient with history of lupus erythematosus, cholangiocarcinoma with biliary drain in place, hypertension, sarcoidosis, type 2 diabetes mellitus, anxiety disorder, depression presented currently under hospitalist service for sepsis, fever.  1.  S/p acute hypoxic respiratory failure Off bipap Once breathing status improves patient will be weaned off BiPAP and put on oxygen via nasal cannula Nebulization treatments  2.  Sepsis Lactic acid improved Elevated procalcitonin level Continue IV vancomycin and IV meropenem antibiotic Follow-up blood cultures  3.  Pneumonia Continue IV antibiotics  4.  Systemic lupus erythematosus On home dose immunosuppressants  5.  History of cholangiocarcinoma with biliary drain in place  6.  Sarcoidosis on home dose Plaquenil  7.  Diabetes mellitus type sliding scale coverage with insulin  8.  Hyponatremia improved  9. Tachycardia : Increase metoprolol to 75 mg orally q 12hrly  All the records are  reviewed and case discussed with Care Management/Social Worker. Management plans discussed with the patient, family and they are in agreement.  CODE STATUS: DNR  DVT Prophylaxis: SCDs  TOTAL TIME TAKING CARE OF THIS PATIENT: 35 minutes.   POSSIBLE D/C IN 2 to 3 DAYS, DEPENDING ON CLINICAL CONDITION.  Saundra Shelling M.D on 08/08/2017 at 10:39 AM  Between 7am to 6pm - Pager - [phone]  After 6pm go to [email or web address] - password [email or web address] [facility]  Office  [phone]  CC: Primary care physician; Idelle Crouch, MD  Note: This dictation was prepared with Dragon dictation along with smaller phrase technology. Any transcriptional errors that result from this process are unintentional.

## 2017-08-09 LAB — BASIC METABOLIC PANEL
Anion gap: 5 (ref 5–15)
BUN: 23 mg/dL — ABNORMAL HIGH (ref 6–20)
CALCIUM: 8 mg/dL — AB (ref 8.9–10.3)
CO2: 25 mmol/L (ref 22–32)
CREATININE: 0.63 mg/dL (ref 0.44–1.00)
Chloride: 99 mmol/L — ABNORMAL LOW (ref 101–111)
GFR calc Af Amer: >60 mL/min (ref >60)
Glucose, Bld: 93 mg/dL (ref 65–99)
POTASSIUM: 3.4 mmol/L — AB (ref 3.5–5.1)
SODIUM: 129 mmol/L — AB (ref 135–145)

## 2017-08-09 LAB — GLUCOSE, CAPILLARY
GLUCOSE-CAPILLARY: 88 mg/dL (ref 65–99)
GLUCOSE-CAPILLARY: 152 mg/dL — AB (ref 65–99)
Glucose-Capillary: 158 mg/dL — ABNORMAL HIGH (ref 65–99)
Glucose-Capillary: 155 mg/dL — ABNORMAL HIGH (ref 65–99)

## 2017-08-09 MED ORDER — POTASSIUM CHLORIDE CRYS ER 20 MEQ PO TBCR
40.0000 meq | EXTENDED_RELEASE_TABLET | Freq: Once | ORAL | Status: AC
  Administered 2017-08-09: 17:00:00 40 meq via ORAL
  Filled 2017-08-09: qty 2

## 2017-08-09 MED ORDER — SODIUM CHLORIDE 0.9 % IV SOLN
2.0000 g | Freq: Two times a day (BID) | INTRAVENOUS | Status: DC
  Filled 2017-08-09: qty 2

## 2017-08-09 MED ORDER — METRONIDAZOLE 500 MG PO TABS
500.0000 mg | ORAL_TABLET | Freq: Three times a day (TID) | ORAL | Status: DC
  Administered 2017-08-09 – 2017-08-10 (×4): 500 mg via ORAL
  Filled 2017-08-09 (×4): qty 1

## 2017-08-09 MED ORDER — SODIUM CHLORIDE 0.9% FLUSH: 10.0000 mL | INTRAVENOUS | Status: DC | PRN

## 2017-08-09 NOTE — Progress Notes (Signed)
[facility] INFECTIOUS DISEASE PROGRESS NOTE Date of Admission:  08/06/2017     Regina Hood is a 77 y.o. female with fevers, sepsis Principal Problem:   Sepsis ([place]) Active Problems:   Systemic lupus erythematosus ([place])   Cholangiocarcinoma ([place])   Anxiety and depression   Hypertension   Sarcoidosis   Type 2 diabetes mellitus without complication, without long-term current use of insulin (HCC)   Subjective: Last  Fever yest 100.6, CT negative. Feels a lot better. Less diff breathing, no abd pain, no pain at drain sites. Some diarrhea after contrast for CT  ROS  Eleven systems are reviewed and negative except per hpi  Medications:  Antibiotics Given (last 72 hours)    Date/Time Action Medication Dose Rate   08/06/17 2342 New Bag/Given   levofloxacin (LEVAQUIN) IVPB 750 mg 750 mg 100 mL/hr   08/06/17 2343 New Bag/Given   aztreonam (AZACTAM) 2 g in sodium chloride 0.9 % 100 mL IVPB 2 g 200 mL/hr   08/06/17 2354 New Bag/Given   vancomycin (VANCOCIN) IVPB 1000 mg/200 mL premix 1,000 mg 200 mL/hr   08/07/17 0428 New Bag/Given   meropenem (MERREM) 1 g in sodium chloride 0.9 % 100 mL IVPB 1 g 200 mL/hr   08/07/17 0942 Given   hydroxychloroquine (PLAQUENIL) tablet 200 mg 200 mg    08/07/17 0942 Given   metroNIDAZOLE (FLAGYL) tablet 500 mg 500 mg    08/07/17 1118 New Bag/Given   meropenem (MERREM) 1 g in sodium chloride 0.9 % 100 mL IVPB 1 g 200 mL/hr   08/07/17 1118 New Bag/Given   vancomycin (VANCOCIN) IVPB 1000 mg/200 mL premix 1,000 mg 200 mL/hr   08/07/17 1649 Given   metroNIDAZOLE (FLAGYL) tablet 500 mg 500 mg    08/07/17 2053 New Bag/Given   meropenem (MERREM) 1 g in sodium chloride 0.9 % 100 mL IVPB 1 g 200 mL/hr   08/07/17 2203 Given   hydroxychloroquine (PLAQUENIL) tablet 200 mg 200 mg    08/07/17 2203 Given   metroNIDAZOLE (FLAGYL) tablet 500 mg 500 mg    08/08/17 0315 New Bag/Given   meropenem (MERREM) 1 g in sodium chloride 0.9 % 100 mL IVPB 1 g 200 mL/hr    08/08/17 0506 New Bag/Given   vancomycin (VANCOCIN) IVPB 1000 mg/200 mL premix 1,000 mg 200 mL/hr   08/08/17 0851 Given   hydroxychloroquine (PLAQUENIL) tablet 200 mg 200 mg    08/08/17 7342 Given   metroNIDAZOLE (FLAGYL) tablet 500 mg 500 mg    08/08/17 1227 New Bag/Given   meropenem (MERREM) 1 g in sodium chloride 0.9 % 100 mL IVPB 1 g 200 mL/hr   08/08/17 1632 Given   metroNIDAZOLE (FLAGYL) tablet 500 mg 500 mg    08/08/17 2044 New Bag/Given   meropenem (MERREM) 1 g in sodium chloride 0.9 % 100 mL IVPB 1 g 200 mL/hr   08/08/17 2238 Given   metroNIDAZOLE (FLAGYL) tablet 500 mg 500 mg    08/08/17 2241 Given   hydroxychloroquine (PLAQUENIL) tablet 200 mg 200 mg    08/08/17 2312 New Bag/Given   vancomycin (VANCOCIN) IVPB 1000 mg/200 mL premix 1,000 mg 200 mL/hr   08/09/17 0443 New Bag/Given   meropenem (MERREM) 1 g in sodium chloride 0.9 % 100 mL IVPB 1 g 200 mL/hr   08/09/17 0916 Given   metroNIDAZOLE (FLAGYL) tablet 500 mg 500 mg    08/09/17 0918 Given   hydroxychloroquine (PLAQUENIL) tablet 200 mg 200 mg    08/09/17 1301  New Bag/Given   meropenem (MERREM) 1 g in sodium chloride 0.9 % 100 mL IVPB 1 g 200 mL/hr     . azaTHIOprine  50 mg Oral Daily  . enoxaparin (LOVENOX) injection  40 mg Subcutaneous Q24H  . feeding supplement (ENSURE ENLIVE)  237 mL Oral BID BM  . gabapentin  100 mg Oral BID  . hydroxychloroquine  200 mg Oral BID  . insulin aspart  0-5 Units Subcutaneous QHS  . insulin aspart  0-9 Units Subcutaneous TID WC  . ipratropium-albuterol  3 mL Nebulization TID  . metoprolol tartrate  75 mg Oral BID  . metroNIDAZOLE  500 mg Oral TID  . mometasone-formoterol  2 puff Inhalation BID  . pantoprazole  40 mg Oral Daily  . predniSONE  5 mg Oral Q breakfast  . venlafaxine XR  225 mg Oral Daily    Objective: Vital signs in last 24 hours: Temp:  [97.6 F (36.4 C)-100.6 F (38.1 C)] 97.6 F (36.4 C) (04/23 0358) Pulse Rate:  [88-127] 96 (04/23 0920) Resp:  [17-24] 17  (04/23 0358) BP: (103-151)/(65-78) 110/65 (04/23 0920) SpO2:  [94 %-100 %] 100 % (04/23 0728) Constitutional:  Ill appearing, lethargic but sitting on commode. On O2 HENT: [place]/AT, PERRLA, no scleral icterus Mouth/Throat: Oropharynx is clear and dry . No oropharyngeal exudate.  Cardiovascular: Normal rate, regular rhythm and normal heart sounds. Pulmonary/Chest: bibasilar crackles and rhonchi, poor air movement R hest Portacath wnl  Neck = supple, no nuchal rigidity Abdominal: distended, mild diffuse tt. Drains on R upper abd wall with no pain, redness at insertion site Lymphadenopathy: no cervical adenopathy. No axillary adenopathy Neurological: lethargic  Skin: diffuse skin changes with scaly eruption from her lupus, some bruising Psychiatric: a normal mood and affect.  behavior is normal.   Lab Results Recent Labs    08/07/17 0405 08/08/17 0456 08/09/17 0435  WBC 11.6* 9.6  --   HGB 8.7* 9.4*  --   HCT 26.3* 28.0*  --   NA 130* 133* 129*  K 3.7 3.5 3.4*  CL 102 101 99*  CO2 24 25 25   BUN 17 19 23*  CREATININE 0.83 0.57 0.63    Microbiology: Results for orders placed or performed during the hospital encounter of 08/06/17  Blood Culture (routine x 2)     Status: None (Preliminary result)   Collection Time: 08/06/17 11:28 PM  Result Value Ref Range Status   Specimen Description BLOOD BLOOD RIGHT HAND  Final   Special Requests   Final    BOTTLES DRAWN AEROBIC AND ANAEROBIC Blood Culture adequate volume   Culture   Final    NO GROWTH 3 DAYS Performed at [facility], [address] [place], [place] [address]    Report Status PENDING  Incomplete  Blood Culture (routine x 2)     Status: None (Preliminary result)   Collection Time: 08/06/17 11:28 PM  Result Value Ref Range Status   Specimen Description BLOOD BLOOD LEFT HAND  Final   Special Requests   Final    BOTTLES DRAWN AEROBIC AND ANAEROBIC Blood Culture adequate volume   Culture   Final    NO GROWTH 3  DAYS Performed at [facility], [address], [place], [place] [address]    Report Status PENDING  Incomplete  Urine culture     Status: None   Collection Time: 08/06/17 11:28 PM  Result Value Ref Range Status   Specimen Description   Final    URINE,  RANDOM Performed at [facility], [address] [place], [place] [address]    Special Requests   Final    NONE Performed at [facility], [address] [place], [place] [address]    Culture   Final    NO GROWTH Performed at [facility], [place] [address], [place], [place] [address]    Report Status 08/08/2017 FINAL  Final    Studies/Results: Ct Abdomen Pelvis W Contrast  Result Date: 08/09/2017 CLINICAL DATA:  History of cholangiocarcinoma with fever. EXAM: CT ABDOMEN AND PELVIS WITH CONTRAST TECHNIQUE: Multidetector CT imaging of the abdomen and pelvis was performed using the standard protocol following bolus administration of intravenous contrast. CONTRAST:  132m ISOVUE-300 IOPAMIDOL (ISOVUE-300) INJECTION 61% COMPARISON:  06/07/2017 FINDINGS: Lower chest: Stable dense pulmonary scarring changes and emphysema. Heart is normal in size. Calcifications noted at the mitral valve annulus. Hepatobiliary: Stable biliary drainage catheters. No complicating features. No intrahepatic biliary dilatation. Stable surgical changes from a partial hepatectomy. Stable air in the gallbladder. No intrahepatic abscess. Pancreas: No mass, inflammation or ductal dilatation. Spleen: Normal size.  No focal lesions. Adrenals/Urinary Tract: Stable mild nodularity of the left adrenal gland. Both kidneys are unremarkable and stable. The bladder appears normal. Stomach/Bowel: The stomach, duodenum, small bowel and colon are grossly normal. No acute inflammatory changes, mass lesions or obstructive findings. No evidence of bowel perforation. Contrast gets all the way to the colon. Moderate sigmoid diverticulosis. Vascular/Lymphatic:  Stable aortic calcifications. No aneurysm or dissection. The branch vessels are patent. The major venous structures are patent. Stable retrocrural and retroperitoneal lymph nodes. Reproductive: The uterus is surgically absent. Left ovary is still present and appears normal. Small cyst is noted. The right ovary is not identified for certain. Other: No ascites. Surgical changes involving the anterior abdominal wall. Musculoskeletal: No significant bony findings. IMPRESSION: 1. Stable biliary drainage catheters. No complicating features. No biliary distention. 2. Stable surgical changes from partial hepatectomy and Roux-en-Y procedure. 3. No abdominal/pelvic abscess or evidence of perforation or obstruction. 4. Persistent retrocrural and retroperitoneal adenopathy. Electronically Signed   By: PMarijo SanesM.D.   On: 08/09/2017 11:29    Assessment/Plan: EAI SONNENFELDis a 78y.o. female with cholangiocarcinoma with two biliary drains in place admitted with malaise and fevers. She was initially ill appearing, hypoxic (but has ILD).  Interestingly lfts only mildly elevated, cxr shows chronic changes, flu pcr negative, ua negative. [place].  She does have a portacath in place but it is not tender or inflamed.  CT neg for abscess or evidence of a  biliary source of sepsis. 4/23- clinicaly much better today and fevers improving. WU negative so far.   Recommendations Can narrow to cefepime and flagyl Dc vanco and monitor Thank you very much for the consult. Will follow with you.  DLeonel Ramsay  08/09/2017, 2:37 PM

## 2017-08-09 NOTE — Progress Notes (Signed)
Pharmacy Antibiotic Note  Regina Hood is a 77 y.o. female admitted on 08/06/2017 with sepsis. Pharmacy has been consulted for cefepime dosing.   Plan: Start cefepime 2g IV every 12 hours.   Height: 5' 2"  (157.5 cm) Weight: 151 lb (68.5 kg) IBW/kg (Calculated) : 50.1  Temp (24hrs), Avg:98.9 F (37.2 C), Min:97.6 F (36.4 C), Max:100.6 F (38.1 C)  Recent Labs  Lab 08/03/17 1353 08/06/17 2329 08/07/17 0215 08/07/17 0405 08/07/17 1128 08/08/17 0456 08/09/17 0435  WBC 10.7 13.1*  --  11.6*  --  9.6  --   CREATININE 0.77 0.79  --  0.83  --  0.57 0.63  LATICACIDVEN  --  5.3* 2.2*  --  0.7  --   --     Estimated Creatinine Clearance: 54.3 mL/min (by C-G formula based on SCr of 0.63 mg/dL).    Allergies  Allergen Reactions  . Penicillins Nausea Only    Antimicrobials this admission: Aztreonam, levaquin x1 4/20;  vancomycin 4/20 >> 4/23 meropenem 4/21  >> 4/23 Cefepime 4/23 >>   Microbiology results: 4/20 BCx: NG x 3 days  4/20 UCx: NG Final  4/20 UA:(-) 4/20 CXR: edema, atalectasis  Thank you for allowing pharmacy to be a part of this patient's care.  Pernell Dupre, PharmD, BCPS Clinical Pharmacist 08/09/2017 2:51 PM

## 2017-08-09 NOTE — Progress Notes (Signed)
[facility] NAME: Shalena Ezzell    MR#:  [id number]  DATE OF BIRTH:  Apr 24, 1940  SUBJECTIVE:  CHIEF COMPLAINT:   Patient seen and evaluated today Comfortable on oxygen via nasal canula Off BiPAP No fevers  REVIEW OF SYSTEMS:    ROS  CONSTITUTIONAL: Had fever. Has fatigue, weakness. No weight gain, no weight loss.  EYES: No blurry or double vision.  ENT: No tinnitus. No postnasal drip. No redness of the oropharynx.  RESPIRATORY: Has occasional cough, wheeze present, no hemoptysis. No dyspnea.  CARDIOVASCULAR: No chest pain. No orthopnea. No palpitations. No syncope.  GASTROINTESTINAL: No nausea, no vomiting or diarrhea. No abdominal pain. No melena or hematochezia.  GENITOURINARY: No dysuria or hematuria.  ENDOCRINE: No polyuria or nocturia. No heat or cold intolerance.  HEMATOLOGY: No anemia. No bruising. No bleeding.  INTEGUMENTARY: No rashes. No lesions.  MUSCULOSKELETAL: No arthritis. No swelling. No gout.  NEUROLOGIC: No numbness, tingling, or ataxia. No seizure-type activity.  PSYCHIATRIC: No anxiety. No insomnia. No ADD.   DRUG ALLERGIES:   Allergies  Allergen Reactions  . Penicillins Nausea Only    VITALS:  Blood pressure 110/65, pulse 96, temperature 97.6 F (36.4 C), temperature source Oral, resp. rate 17, height 5' 2"  (1.575 m), weight 68.5 kg (151 lb), SpO2 100 %.  PHYSICAL EXAMINATION:   Physical Exam  GENERAL:  77 y.o.-year-old patient lying in the bed with respiratory distress EYES: Pupils equal, round, reactive to light and accommodation. No scleral icterus. Extraocular muscles intact.  HEENT: Head atraumatic, normocephalic. Oropharynx and nasopharynx clear.  NECK:  Supple, no jugular venous distention. No thyroid enlargement, no tenderness.  LUNGS: Decreased breath sounds bilaterally, bilateral wheezing decreased. No use of accessory muscles of respiration.  CARDIOVASCULAR: S1, S2 tachycardia. No  murmurs, rubs, or gallops.  ABDOMEN: Soft, mild tenderness around umbilicus, nondistended. Bowel sounds present. No organomegaly or mass.  EXTREMITIES: No cyanosis, clubbing or edema b/l.    NEUROLOGIC: Cranial nerves II through XII are intact. No focal Motor or sensory deficits b/l.   PSYCHIATRIC: The patient is alert and oriented x 3.  SKIN: No obvious rash, lesion, or ulcer.   LABORATORY PANEL:   CBC Recent Labs  Lab 08/08/17 0456  WBC 9.6  HGB 9.4*  HCT 28.0*  PLT 205   ------------------------------------------------------------------------------------------------------------------ Chemistries  Recent Labs  Lab 08/03/17 1353 08/06/17 2329  08/09/17 0435  NA 132* 130*   < > 129*  K 3.7 3.7   < > 3.4*  CL 101 98*   < > 99*  CO2 23 21*   < > 25  GLUCOSE 181* 178*   < > 93  BUN 19 13   < > 23*  CREATININE 0.77 0.79   < > 0.63  CALCIUM 9.2 8.5*   < > 8.0*  MG 1.8  --   --   --   AST 38 58*  --   --   ALT 32 29  --   --   ALKPHOS 272* 241*  --   --   BILITOT 0.5 0.6  --   --    < > = values in this interval not displayed.   ------------------------------------------------------------------------------------------------------------------  Cardiac Enzymes Recent Labs  Lab 08/07/17 1020  TROPONINI 0.04*   ------------------------------------------------------------------------------------------------------------------  RADIOLOGY:  Ct Abdomen Pelvis W Contrast  Result Date: 08/09/2017 CLINICAL DATA:  History of cholangiocarcinoma with fever. EXAM: CT ABDOMEN AND PELVIS WITH CONTRAST TECHNIQUE: Multidetector CT imaging  of the abdomen and pelvis was performed using the standard protocol following bolus administration of intravenous contrast. CONTRAST:  123m ISOVUE-300 IOPAMIDOL (ISOVUE-300) INJECTION 61% COMPARISON:  06/07/2017 FINDINGS: Lower chest: Stable dense pulmonary scarring changes and emphysema. Heart is normal in size. Calcifications noted at the mitral valve  annulus. Hepatobiliary: Stable biliary drainage catheters. No complicating features. No intrahepatic biliary dilatation. Stable surgical changes from a partial hepatectomy. Stable air in the gallbladder. No intrahepatic abscess. Pancreas: No mass, inflammation or ductal dilatation. Spleen: Normal size.  No focal lesions. Adrenals/Urinary Tract: Stable mild nodularity of the left adrenal gland. Both kidneys are unremarkable and stable. The bladder appears normal. Stomach/Bowel: The stomach, duodenum, small bowel and colon are grossly normal. No acute inflammatory changes, mass lesions or obstructive findings. No evidence of bowel perforation. Contrast gets all the way to the colon. Moderate sigmoid diverticulosis. Vascular/Lymphatic: Stable aortic calcifications. No aneurysm or dissection. The branch vessels are patent. The major venous structures are patent. Stable retrocrural and retroperitoneal lymph nodes. Reproductive: The uterus is surgically absent. Left ovary is still present and appears normal. Small cyst is noted. The right ovary is not identified for certain. Other: No ascites. Surgical changes involving the anterior abdominal wall. Musculoskeletal: No significant bony findings. IMPRESSION: 1. Stable biliary drainage catheters. No complicating features. No biliary distention. 2. Stable surgical changes from partial hepatectomy and Roux-en-Y procedure. 3. No abdominal/pelvic abscess or evidence of perforation or obstruction. 4. Persistent retrocrural and retroperitoneal adenopathy. Electronically Signed   By: PMarijo SanesM.D.   On: 08/09/2017 11:29     ASSESSMENT AND PLAN:   77year old elderly female patient with history of lupus erythematosus, cholangiocarcinoma with biliary drain in place, hypertension, sarcoidosis, type 2 diabetes mellitus, anxiety disorder, depression presented currently under hospitalist service for sepsis, fever.  1.  S/p acute hypoxic respiratory failure Off bipap On  oxygen by nasal cannula Nebulization treatments as needed  2.  Sepsis ruled out Lactic acid improved with hydration Blood cultures no growth so far Status post ID evaluation We will start patient on oral Flagyl antibiotic Discontinue IV vancomycin and IV meropenem antibiotics  3.  Interstitial lung disease Continue oxygen via nasal cannula  4.  Systemic lupus erythematosus On home dose immunosuppressants  5.  History of cholangiocarcinoma with biliary drain in place CT abdomen no evidence of any infection, obstruction  6.  Sarcoidosis on home dose Plaquenil  7.  Diabetes mellitus type sliding scale coverage with insulin  8.  Hyponatremia stable  9.  Patient lives alone and unable to take care of herself at home [facility]ceiving physical therapy in the hospital   All the records are reviewed and case discussed with Care Management/Social Worker. Management plans discussed with the patient, family and they are in agreement.  CODE STATUS: DNR  DVT Prophylaxis: SCDs  TOTAL TIME TAKING CARE OF THIS PATIENT: 35 minutes.   POSSIBLE D/C IN 1 DAYS, DEPENDING ON CLINICAL CONDITION.  PSaundra ShellingM.D on 08/09/2017 at 3:07 PM  Between 7am to 6pm - Pager - [phone]  After 6pm go to [email or web address] - password [email or web address]  Office  [phone] CC: Primary care physician; SIdelle Crouch MD  Note: This dictation was prepared with Dragon dictation along with smaller phrase technology. Any transcriptional errors that result from this process are unintentional.

## 2017-08-09 NOTE — Evaluation (Signed)
Physical Therapy Evaluation Patient Details Name: Regina Hood MRN: [id number] DOB: [birth year] Today's Date: 08/09/2017   History of Present Illness  Patient is a 77 year old female admitted for sepsis following c/o fever and chills.  Clinical Impression  Pt is a 77 year old female who lives in a single story home alone.  She is independent without AD at baseline.  Pt in bed upon PT arrival and able to perform bed mobility with mod I.  Pt presented with overall decreased strength of LE and UE and decreased L shoulder flexion ROM.  Pt able to stand with min A for stability and upright posture.  PT provided education concerning safe use of RW.  Pt able to ambulate in room 20 ft with VC's for use of RW and presenting with labored breathing near the end.  She demonstrates gait deviations indicative of fall risk with dynamic activity.  Pt able to sequence turn and sit in chair with VC's from PT.  Pt will continue to benefit from skilled PT with focus on strength, balance, tolerance to activity, functional mobility and safe use of RW.    Follow Up Recommendations SNF    Equipment Recommendations  None recommended by PT(To be determined at next venue of care.)    Recommendations for Other Services       Precautions / Restrictions Precautions Precautions: Fall Restrictions Weight Bearing Restrictions: No      Mobility  Bed Mobility Overal bed mobility: Independent                Transfers Overall transfer level: Needs assistance Equipment used: Rolling walker (2 wheeled) Transfers: Sit to/from Stand Sit to Stand: Min assist         General transfer comment: Pt able to stand following assistance with initiating STS and close CGA to steady following.  Ambulation/Gait Ambulation/Gait assistance: Min guard Ambulation Distance (Feet): 20 Feet Assistive device: Rolling walker (2 wheeled)     Gait velocity interpretation: <1.8 ft/sec, indicate of risk for recurrent  falls General Gait Details: Able to walk in room with RW, slight lateral deviations which pt was able to correct with RW.  Low foot clearance, narrow BOS and decreased step length.  Stairs            Wheelchair Mobility    Modified Rankin (Stroke Patients Only)       Balance Overall balance assessment: Needs assistance Sitting-balance support: Feet supported;Single extremity supported       Standing balance support: Bilateral upper extremity supported                 High level balance activites: Side stepping;Backward walking High Level Balance Comments: Able to perform higher level balance activity with use of RW and slowed gait.             Pertinent Vitals/Pain Pain Assessment: No/denies pain    Home Living Family/patient expects to be discharged to:: Skilled nursing facility Living Arrangements: Alone                    Prior Function Level of Independence: Independent               Hand Dominance        Extremity/Trunk Assessment   Upper Extremity Assessment Upper Extremity Assessment: Generalized weakness    Lower Extremity Assessment Lower Extremity Assessment: Overall WFL for tasks assessed    Cervical / Trunk Assessment Cervical / Trunk Assessment: Normal  Communication  Communication: No difficulties  Cognition Arousal/Alertness: Awake/alert Behavior During Therapy: WFL for tasks assessed/performed Overall Cognitive Status: Within Functional Limits for tasks assessed                                        General Comments      Exercises     Assessment/Plan    PT Assessment Patient needs continued PT services  PT Problem List Decreased strength;Decreased mobility;Decreased balance;Decreased knowledge of use of DME;Decreased activity tolerance;Cardiopulmonary status limiting activity       PT Treatment Interventions DME instruction;Therapeutic activities;Gait training;Patient/family  education;Therapeutic exercise;Stair training;Balance training;Functional mobility training;Neuromuscular re-education    PT Goals (Current goals can be found in the Care Plan section)  Acute Rehab PT Goals Patient Stated Goal: To return home when she regains strength. PT Goal Formulation: With patient Time For Goal Achievement: 08/23/17 Potential to Achieve Goals: Good    Frequency Min 2X/week   Barriers to discharge        Co-evaluation               AM-PAC PT "6 Clicks" Daily Activity  Outcome Measure Difficulty turning over in bed (including adjusting bedclothes, sheets and blankets)?: A Little Difficulty moving from lying on back to sitting on the side of the bed? : A Little Difficulty sitting down on and standing up from a chair with arms (e.g., wheelchair, bedside commode, etc,.)?: A Little Help needed moving to and from a bed to chair (including a wheelchair)?: A Little Help needed walking in hospital room?: A Little Help needed climbing 3-5 steps with a railing? : A Little 6 Click Score: 18    End of Session Equipment Utilized During Treatment: Gait belt;Oxygen Activity Tolerance: Patient limited by fatigue Patient left: in chair;with call bell/phone within reach;with chair alarm set;with family/visitor present   PT Visit Diagnosis: Unsteadiness on feet (R26.81);Repeated falls (R29.6);Muscle weakness (generalized) (M62.81)    Time: 1400-1420 PT Time Calculation (min) (ACUTE ONLY): 20 min   Charges:   PT Evaluation $PT Eval Low Complexity: 1 Low     PT G Codes:   PT G-Codes **NOT FOR INPATIENT CLASS** Functional Assessment Tool Used: AM-PAC 6 Clicks Basic Mobility    Roxanne Gates, PT, DPT  Roxanne Gates 08/09/2017, 2:52 PM

## 2017-08-09 NOTE — Progress Notes (Signed)
Per [facility] case manager the medical director is reviewing the case. Patient's son Elta Guadeloupe is aware of above. Elta Guadeloupe prefers for patient to go to [facility] for short term rehab but understands that if [facility] denies SNF then patient will have to D/C home.   [employer], LCSW [phone]

## 2017-08-10 DIAGNOSIS — I999 Unspecified disorder of circulatory system: Secondary | ICD-10-CM | POA: Diagnosis not present

## 2017-08-10 DIAGNOSIS — I214 Non-ST elevation (NSTEMI) myocardial infarction: Secondary | ICD-10-CM | POA: Diagnosis not present

## 2017-08-10 DIAGNOSIS — Z7401 Bed confinement status: Secondary | ICD-10-CM | POA: Diagnosis not present

## 2017-08-10 DIAGNOSIS — Y95 Nosocomial condition: Secondary | ICD-10-CM | POA: Diagnosis present

## 2017-08-10 DIAGNOSIS — I110 Hypertensive heart disease with heart failure: Secondary | ICD-10-CM | POA: Diagnosis not present

## 2017-08-10 DIAGNOSIS — Z794 Long term (current) use of insulin: Secondary | ICD-10-CM | POA: Diagnosis not present

## 2017-08-10 DIAGNOSIS — E870 Hyperosmolality and hypernatremia: Secondary | ICD-10-CM | POA: Diagnosis not present

## 2017-08-10 DIAGNOSIS — Z9911 Dependence on respirator [ventilator] status: Secondary | ICD-10-CM | POA: Diagnosis not present

## 2017-08-10 DIAGNOSIS — C221 Intrahepatic bile duct carcinoma: Secondary | ICD-10-CM | POA: Diagnosis not present

## 2017-08-10 DIAGNOSIS — G9341 Metabolic encephalopathy: Secondary | ICD-10-CM | POA: Diagnosis not present

## 2017-08-10 DIAGNOSIS — E876 Hypokalemia: Secondary | ICD-10-CM | POA: Diagnosis not present

## 2017-08-10 DIAGNOSIS — M329 Systemic lupus erythematosus, unspecified: Secondary | ICD-10-CM | POA: Diagnosis not present

## 2017-08-10 DIAGNOSIS — I248 Other forms of acute ischemic heart disease: Secondary | ICD-10-CM | POA: Diagnosis not present

## 2017-08-10 DIAGNOSIS — I5031 Acute diastolic (congestive) heart failure: Secondary | ICD-10-CM | POA: Diagnosis not present

## 2017-08-10 DIAGNOSIS — F418 Other specified anxiety disorders: Secondary | ICD-10-CM | POA: Diagnosis not present

## 2017-08-10 DIAGNOSIS — A419 Sepsis, unspecified organism: Secondary | ICD-10-CM | POA: Diagnosis not present

## 2017-08-10 DIAGNOSIS — Z66 Do not resuscitate: Secondary | ICD-10-CM | POA: Diagnosis not present

## 2017-08-10 DIAGNOSIS — J9611 Chronic respiratory failure with hypoxia: Secondary | ICD-10-CM | POA: Diagnosis not present

## 2017-08-10 DIAGNOSIS — K8309 Other cholangitis: Secondary | ICD-10-CM | POA: Diagnosis not present

## 2017-08-10 DIAGNOSIS — Z9981 Dependence on supplemental oxygen: Secondary | ICD-10-CM | POA: Diagnosis not present

## 2017-08-10 DIAGNOSIS — J441 Chronic obstructive pulmonary disease with (acute) exacerbation: Secondary | ICD-10-CM | POA: Diagnosis not present

## 2017-08-10 DIAGNOSIS — Z7189 Other specified counseling: Secondary | ICD-10-CM | POA: Diagnosis not present

## 2017-08-10 DIAGNOSIS — M6281 Muscle weakness (generalized): Secondary | ICD-10-CM | POA: Diagnosis not present

## 2017-08-10 DIAGNOSIS — J849 Interstitial pulmonary disease, unspecified: Secondary | ICD-10-CM | POA: Diagnosis not present

## 2017-08-10 DIAGNOSIS — J189 Pneumonia, unspecified organism: Secondary | ICD-10-CM | POA: Diagnosis not present

## 2017-08-10 DIAGNOSIS — J440 Chronic obstructive pulmonary disease with acute lower respiratory infection: Secondary | ICD-10-CM | POA: Diagnosis not present

## 2017-08-10 DIAGNOSIS — Z7952 Long term (current) use of systemic steroids: Secondary | ICD-10-CM | POA: Diagnosis not present

## 2017-08-10 DIAGNOSIS — E871 Hypo-osmolality and hyponatremia: Secondary | ICD-10-CM | POA: Diagnosis not present

## 2017-08-10 DIAGNOSIS — Z515 Encounter for palliative care: Secondary | ICD-10-CM | POA: Diagnosis not present

## 2017-08-10 DIAGNOSIS — C249 Malignant neoplasm of biliary tract, unspecified: Secondary | ICD-10-CM | POA: Diagnosis not present

## 2017-08-10 DIAGNOSIS — J9621 Acute and chronic respiratory failure with hypoxia: Secondary | ICD-10-CM | POA: Diagnosis not present

## 2017-08-10 DIAGNOSIS — K219 Gastro-esophageal reflux disease without esophagitis: Secondary | ICD-10-CM | POA: Diagnosis not present

## 2017-08-10 DIAGNOSIS — F329 Major depressive disorder, single episode, unspecified: Secondary | ICD-10-CM | POA: Diagnosis not present

## 2017-08-10 DIAGNOSIS — E119 Type 2 diabetes mellitus without complications: Secondary | ICD-10-CM | POA: Diagnosis not present

## 2017-08-10 DIAGNOSIS — J8410 Pulmonary fibrosis, unspecified: Secondary | ICD-10-CM | POA: Diagnosis not present

## 2017-08-10 DIAGNOSIS — L932 Other local lupus erythematosus: Secondary | ICD-10-CM | POA: Diagnosis not present

## 2017-08-10 DIAGNOSIS — M1990 Unspecified osteoarthritis, unspecified site: Secondary | ICD-10-CM | POA: Diagnosis not present

## 2017-08-10 DIAGNOSIS — D869 Sarcoidosis, unspecified: Secondary | ICD-10-CM | POA: Diagnosis not present

## 2017-08-10 DIAGNOSIS — G9340 Encephalopathy, unspecified: Secondary | ICD-10-CM | POA: Diagnosis not present

## 2017-08-10 DIAGNOSIS — R0602 Shortness of breath: Secondary | ICD-10-CM | POA: Diagnosis not present

## 2017-08-10 DIAGNOSIS — Z4803 Encounter for change or removal of drains: Secondary | ICD-10-CM | POA: Diagnosis not present

## 2017-08-10 DIAGNOSIS — D638 Anemia in other chronic diseases classified elsewhere: Secondary | ICD-10-CM | POA: Diagnosis not present

## 2017-08-10 DIAGNOSIS — J181 Lobar pneumonia, unspecified organism: Secondary | ICD-10-CM | POA: Diagnosis not present

## 2017-08-10 DIAGNOSIS — K5190 Ulcerative colitis, unspecified, without complications: Secondary | ICD-10-CM | POA: Diagnosis not present

## 2017-08-10 DIAGNOSIS — R509 Fever, unspecified: Secondary | ICD-10-CM | POA: Diagnosis not present

## 2017-08-10 DIAGNOSIS — R6520 Severe sepsis without septic shock: Secondary | ICD-10-CM | POA: Diagnosis not present

## 2017-08-10 DIAGNOSIS — L930 Discoid lupus erythematosus: Secondary | ICD-10-CM | POA: Diagnosis not present

## 2017-08-10 DIAGNOSIS — R4182 Altered mental status, unspecified: Secondary | ICD-10-CM | POA: Diagnosis not present

## 2017-08-10 DIAGNOSIS — I10 Essential (primary) hypertension: Secondary | ICD-10-CM | POA: Diagnosis not present

## 2017-08-10 DIAGNOSIS — R5381 Other malaise: Secondary | ICD-10-CM | POA: Diagnosis not present

## 2017-08-10 DIAGNOSIS — J449 Chronic obstructive pulmonary disease, unspecified: Secondary | ICD-10-CM | POA: Diagnosis not present

## 2017-08-10 DIAGNOSIS — K5792 Diverticulitis of intestine, part unspecified, without perforation or abscess without bleeding: Secondary | ICD-10-CM | POA: Diagnosis not present

## 2017-08-10 DIAGNOSIS — E538 Deficiency of other specified B group vitamins: Secondary | ICD-10-CM | POA: Diagnosis not present

## 2017-08-10 DIAGNOSIS — M5136 Other intervertebral disc degeneration, lumbar region: Secondary | ICD-10-CM | POA: Diagnosis not present

## 2017-08-10 LAB — BASIC METABOLIC PANEL
ANION GAP: 4 — AB (ref 5–15)
BUN: 26 mg/dL — ABNORMAL HIGH (ref 6–20)
CALCIUM: 8.6 mg/dL — AB (ref 8.9–10.3)
CO2: 27 mmol/L (ref 22–32)
CREATININE: 0.63 mg/dL (ref 0.44–1.00)
Chloride: 99 mmol/L — ABNORMAL LOW (ref 101–111)
Glucose, Bld: 148 mg/dL — ABNORMAL HIGH (ref 65–99)
Potassium: 4.2 mmol/L (ref 3.5–5.1)
SODIUM: 130 mmol/L — AB (ref 135–145)

## 2017-08-10 LAB — GLUCOSE, CAPILLARY
GLUCOSE-CAPILLARY: 122 mg/dL — AB (ref 65–99)
GLUCOSE-CAPILLARY: 136 mg/dL — AB (ref 65–99)
GLUCOSE-CAPILLARY: 80 mg/dL (ref 65–99)

## 2017-08-10 MED ORDER — METRONIDAZOLE 500 MG PO TABS: 500.0000 mg | ORAL_TABLET | Freq: Three times a day (TID) | ORAL | 0 refills | Status: DC

## 2017-08-10 MED ORDER — METOPROLOL TARTRATE 75 MG PO TABS: 75.0000 mg | ORAL_TABLET | Freq: Two times a day (BID) | ORAL | 0 refills | Status: DC

## 2017-08-10 MED ORDER — SODIUM CHLORIDE 0.9 % IV SOLN
2.0000 g | Freq: Once | INTRAVENOUS | Status: AC
  Administered 2017-08-10: 2 g via INTRAVENOUS
  Filled 2017-08-10: qty 2

## 2017-08-10 MED ORDER — HEPARIN SOD (PORK) LOCK FLUSH 100 UNIT/ML IV SOLN
500.0000 [IU] | Freq: Once | INTRAVENOUS | Status: AC
  Administered 2017-08-10: 500 [IU] via INTRAVENOUS
  Filled 2017-08-10: qty 5

## 2017-08-10 MED ORDER — CIPROFLOXACIN HCL 500 MG PO TABS: 500.0000 mg | ORAL_TABLET | Freq: Two times a day (BID) | ORAL | 0 refills | Status: DC

## 2017-08-10 NOTE — Plan of Care (Signed)
Pt is d/ced - going to [facility].  Called report to [facility] @ [phone].  Pt has been more withdrawn today than yesterday.  States that she's not feeling as good today as yesterday.  She will be followed by Palliative Care at [facility].  Infectious disease wanted her to get a dose of cefepime before d/c.  That was given.  Biliary tubes flushed.  Port deaccessed and heparin locked.  Pt will transport via EMS.

## 2017-08-10 NOTE — Care Management Important Message (Signed)
Important Message  Patient Details  Name: Regina Hood MRN: [id number] Date of Birth: 07-15-1940   Medicare Important Message Given:  Yes    [name] [name] 08/10/2017, 10:50 AM

## 2017-08-10 NOTE — Clinical Social Work Placement (Signed)
   CLINICAL SOCIAL WORK PLACEMENT  NOTE  Date:  08/10/2017  Patient Details  Name: Regina Hood MRN: [id number] Date of Birth: June 23, 1940  Clinical Social Work is seeking post-discharge placement for this patient at the [place] level of care (*CSW will initial, date and re-position this form in  chart as items are completed):  Yes   Patient/family provided with [place] Work Department's list of facilities offering this level of care within the geographic area requested by the patient (or if unable, by the patient's family).  Yes   Patient/family informed of their freedom to choose among providers that offer the needed level of care, that participate in Medicare, Medicaid or managed care program needed by the patient, have an available bed and are willing to accept the patient.  Yes   Patient/family informed of 's ownership interest in [facility] and [facility], as well as of the fact that they are under no obligation to receive care at these facilities.  PASRR submitted to EDS on 08/08/17     PASRR number received on 08/08/17     Existing PASRR number confirmed on       FL2 transmitted to all facilities in geographic area requested by pt/family on 08/08/17     FL2 transmitted to all facilities within larger geographic area on       Patient informed that his/her managed care company has contracts with or will negotiate with certain facilities, including the following:        Yes   Patient/family informed of bed offers received.  Patient chooses bed at [facility] )     Physician recommends and patient chooses bed at      Patient to be transferred to [facility] ) on 08/10/17.  Patient to be transferred to facility by [facility] )     Patient family notified on 08/10/17 of transfer.  Name of family member notified:  (Patient's son Elta Guadeloupe is aware of D/C today. )     PHYSICIAN       Additional  Comment:    _______________________________________________ Miaa Latterell, Veronia Beets, LCSW 08/10/2017, 2:46 PM

## 2017-08-10 NOTE — Progress Notes (Signed)
Patient is medically stable for D/C to [facility] today. [facility] SNF authorization has been received, authorization # [id number]. Per [facility] admissions coordinator at [facility] patient can come today to room 505. RN will call report and arrange EMS for transport. Clinical Education officer, museum (CSW) sent D/C orders to [facility] via [place]. Patient is aware of above. CSW contacted patient's son Elta Guadeloupe and made him aware of above. Please reconsult if future social work needs arise. CSW signing off.   [employer], LCSW [phone]

## 2017-08-10 NOTE — Progress Notes (Signed)
Pharmacy Antibiotic Note  Regina Hood is a 77 y.o. female admitted on 08/06/2017 with sepsis. Pharmacy has been consulted for cefepime dosing.   Plan: Per Dr. Ola Spurr - Give cefepime dose prior to discharge. Will order cefepime 2g IV x 1 dose.   Height: 5' 2"  (157.5 cm) Weight: 151 lb (68.5 kg) IBW/kg (Calculated) : 50.1  Temp (24hrs), Avg:98.5 F (36.9 C), Min:97.7 F (36.5 C), Max:99.5 F (37.5 C)  Recent Labs  Lab 08/06/17 2329 08/07/17 0215 08/07/17 0405 08/07/17 1128 08/08/17 0456 08/09/17 0435 08/10/17 0517  WBC 13.1*  --  11.6*  --  9.6  --   --   CREATININE 0.79  --  0.83  --  0.57 0.63 0.63  LATICACIDVEN 5.3* 2.2*  --  0.7  --   --   --     Estimated Creatinine Clearance: 54.3 mL/min (by C-G formula based on SCr of 0.63 mg/dL).    Allergies  Allergen Reactions  . Penicillins Nausea Only    Antimicrobials this admission: Aztreonam, levaquin x1 4/20;  vancomycin 4/20 >> 4/23 meropenem 4/21  >> 4/23 Cefepime 4/23 >>   Microbiology results: 4/20 BCx: NG x 3 days  4/20 UCx: NG Final  4/20 UA:(-) 4/20 CXR: edema, atalectasis  Thank you for allowing pharmacy to be a part of this patient's care.  Pernell Dupre, PharmD, BCPS Clinical Pharmacist 08/10/2017 3:10 PM

## 2017-08-10 NOTE — Progress Notes (Signed)
[facility] INFECTIOUS DISEASE PROGRESS NOTE Date of Admission:  08/06/2017     ID: CHINAZA ROOKE is a 77 y.o. female with fevers, sepsis Principal Problem:   Sepsis ([place]) Active Problems:   Systemic lupus erythematosus ([place])   Cholangiocarcinoma ([place])   Anxiety and depression   Hypertension   Sarcoidosis   Type 2 diabetes mellitus without complication, without long-term current use of insulin (HCC)   Subjective: Last  Fever yest 100.6, CT negative. Feels very tired today, did not sleep til 4 am. Feeling a chill  ROS  Eleven systems are reviewed and negative except per hpi  Medications:  Antibiotics Given (last 72 hours)    Date/Time Action Medication Dose Rate   08/07/17 1649 Given   metroNIDAZOLE (FLAGYL) tablet 500 mg 500 mg    08/07/17 2053 New Bag/Given   meropenem (MERREM) 1 g in sodium chloride 0.9 % 100 mL IVPB 1 g 200 mL/hr   08/07/17 2203 Given   hydroxychloroquine (PLAQUENIL) tablet 200 mg 200 mg    08/07/17 2203 Given   metroNIDAZOLE (FLAGYL) tablet 500 mg 500 mg    08/08/17 0315 New Bag/Given   meropenem (MERREM) 1 g in sodium chloride 0.9 % 100 mL IVPB 1 g 200 mL/hr   08/08/17 0506 New Bag/Given   vancomycin (VANCOCIN) IVPB 1000 mg/200 mL premix 1,000 mg 200 mL/hr   08/08/17 0851 Given   hydroxychloroquine (PLAQUENIL) tablet 200 mg 200 mg    08/08/17 7824 Given   metroNIDAZOLE (FLAGYL) tablet 500 mg 500 mg    08/08/17 1227 New Bag/Given   meropenem (MERREM) 1 g in sodium chloride 0.9 % 100 mL IVPB 1 g 200 mL/hr   08/08/17 1632 Given   metroNIDAZOLE (FLAGYL) tablet 500 mg 500 mg    08/08/17 2044 New Bag/Given   meropenem (MERREM) 1 g in sodium chloride 0.9 % 100 mL IVPB 1 g 200 mL/hr   08/08/17 2238 Given   metroNIDAZOLE (FLAGYL) tablet 500 mg 500 mg    08/08/17 2241 Given   hydroxychloroquine (PLAQUENIL) tablet 200 mg 200 mg    08/08/17 2312 New Bag/Given   vancomycin (VANCOCIN) IVPB 1000 mg/200 mL premix 1,000 mg 200 mL/hr   08/09/17 0443 New  Bag/Given   meropenem (MERREM) 1 g in sodium chloride 0.9 % 100 mL IVPB 1 g 200 mL/hr   08/09/17 0916 Given   metroNIDAZOLE (FLAGYL) tablet 500 mg 500 mg    08/09/17 2353 Given   hydroxychloroquine (PLAQUENIL) tablet 200 mg 200 mg    08/09/17 1301 New Bag/Given   meropenem (MERREM) 1 g in sodium chloride 0.9 % 100 mL IVPB 1 g 200 mL/hr   08/09/17 1636 Given   metroNIDAZOLE (FLAGYL) tablet 500 mg 500 mg    08/09/17 2007 Given   metroNIDAZOLE (FLAGYL) tablet 500 mg 500 mg    08/09/17 2010 Given   hydroxychloroquine (PLAQUENIL) tablet 200 mg 200 mg    08/10/17 0515 Given   metroNIDAZOLE (FLAGYL) tablet 500 mg 500 mg    08/10/17 1011 Given   hydroxychloroquine (PLAQUENIL) tablet 200 mg 200 mg    08/10/17 1407 Given   metroNIDAZOLE (FLAGYL) tablet 500 mg 500 mg      . azaTHIOprine  50 mg Oral Daily  . enoxaparin (LOVENOX) injection  40 mg Subcutaneous Q24H  . feeding supplement (ENSURE ENLIVE)  237 mL Oral BID BM  . gabapentin  100 mg Oral BID  . hydroxychloroquine  200 mg Oral BID  . insulin aspart  0-5  Units Subcutaneous QHS  . insulin aspart  0-9 Units Subcutaneous TID WC  . ipratropium-albuterol  3 mL Nebulization TID  . metoprolol tartrate  75 mg Oral BID  . metroNIDAZOLE  500 mg Oral Q8H  . mometasone-formoterol  2 puff Inhalation BID  . pantoprazole  40 mg Oral Daily  . predniSONE  5 mg Oral Q breakfast  . venlafaxine XR  225 mg Oral Daily    Objective: Vital signs in last 24 hours: Temp:  [97.7 F (36.5 C)-99.5 F (37.5 C)] 99.5 F (37.5 C) (04/24 1209) Pulse Rate:  [93-108] 93 (04/24 1209) Resp:  [17-22] 22 (04/24 1209) BP: (91-158)/(66-82) 121/73 (04/24 1209) SpO2:  [95 %-100 %] 99 % (04/24 1209) Constitutional:  Ill appearing, lethargic but sitting on commode. On O2 HENT: [place]/AT, PERRLA, no scleral icterus Mouth/Throat: Oropharynx is clear and dry . No oropharyngeal exudate.  Cardiovascular: Normal rate, regular rhythm and normal heart sounds. Pulmonary/Chest:  bibasilar crackles and rhonchi, poor air movement R hest Portacath wnl  Neck = supple, no nuchal rigidity Abdominal: distended, mild diffuse tt. Drains on R upper abd wall with no pain, redness at insertion site Lymphadenopathy: no cervical adenopathy. No axillary adenopathy Neurological: lethargic  Skin: diffuse skin changes with scaly eruption from her lupus, some bruising Psychiatric: a normal mood and affect.  behavior is normal.   Lab Results Recent Labs    08/08/17 0456 08/09/17 0435 08/10/17 0517  WBC 9.6  --   --   HGB 9.4*  --   --   HCT 28.0*  --   --   NA 133* 129* 130*  K 3.5 3.4* 4.2  CL 101 99* 99*  CO2 25 25 27   BUN 19 23* 26*  CREATININE 0.57 0.63 0.63    Microbiology: Results for orders placed or performed during the hospital encounter of 08/06/17  Blood Culture (routine x 2)     Status: None (Preliminary result)   Collection Time: 08/06/17 11:28 PM  Result Value Ref Range Status   Specimen Description BLOOD BLOOD RIGHT HAND  Final   Special Requests   Final    BOTTLES DRAWN AEROBIC AND ANAEROBIC Blood Culture adequate volume   Culture   Final    NO GROWTH 4 DAYS Performed at [facility], [address], [place], [place] [address]    Report Status PENDING  Incomplete  Blood Culture (routine x 2)     Status: None (Preliminary result)   Collection Time: 08/06/17 11:28 PM  Result Value Ref Range Status   Specimen Description BLOOD BLOOD LEFT HAND  Final   Special Requests   Final    BOTTLES DRAWN AEROBIC AND ANAEROBIC Blood Culture adequate volume   Culture   Final    NO GROWTH 4 DAYS Performed at [facility], [address] [place], [place] [address]    Report Status PENDING  Incomplete  Urine culture     Status: None   Collection Time: 08/06/17 11:28 PM  Result Value Ref Range Status   Specimen Description   Final    URINE, RANDOM Performed at [facility], [address], [place], [place] [address]    Special  Requests   Final    NONE Performed at [facility], [address] [place], [place] [address]    Culture   Final    NO GROWTH Performed at [facility], [place] [address] [place], [place] [address]    Report Status 08/08/2017 FINAL  Final    Studies/Results: Ct Abdomen Pelvis W Contrast  Result Date: 08/09/2017 CLINICAL DATA:  History of cholangiocarcinoma with fever. EXAM: CT ABDOMEN AND PELVIS WITH CONTRAST TECHNIQUE: Multidetector CT imaging of the abdomen and pelvis was performed using the standard protocol following bolus administration of intravenous contrast. CONTRAST:  152m ISOVUE-300 IOPAMIDOL (ISOVUE-300) INJECTION 61% COMPARISON:  06/07/2017 FINDINGS: Lower chest: Stable dense pulmonary scarring changes and emphysema. Heart is normal in size. Calcifications noted at the mitral valve annulus. Hepatobiliary: Stable biliary drainage catheters. No complicating features. No intrahepatic biliary dilatation. Stable surgical changes from a partial hepatectomy. Stable air in the gallbladder. No intrahepatic abscess. Pancreas: No mass, inflammation or ductal dilatation. Spleen: Normal size.  No focal lesions. Adrenals/Urinary Tract: Stable mild nodularity of the left adrenal gland. Both kidneys are unremarkable and stable. The bladder appears normal. Stomach/Bowel: The stomach, duodenum, small bowel and colon are grossly normal. No acute inflammatory changes, mass lesions or obstructive findings. No evidence of bowel perforation. Contrast gets all the way to the colon. Moderate sigmoid diverticulosis. Vascular/Lymphatic: Stable aortic calcifications. No aneurysm or dissection. The branch vessels are patent. The major venous structures are patent. Stable retrocrural and retroperitoneal lymph nodes. Reproductive: The uterus is surgically absent. Left ovary is still present and appears normal. Small cyst is noted. The right ovary is not identified for certain. Other: No ascites. Surgical  changes involving the anterior abdominal wall. Musculoskeletal: No significant bony findings. IMPRESSION: 1. Stable biliary drainage catheters. No complicating features. No biliary distention. 2. Stable surgical changes from partial hepatectomy and Roux-en-Y procedure. 3. No abdominal/pelvic abscess or evidence of perforation or obstruction. 4. Persistent retrocrural and retroperitoneal adenopathy. Electronically Signed   By: PMarijo SanesM.D.   On: 08/09/2017 11:29    Assessment/Plan: ELYNITA GROSECLOSEis a 77y.o. female with cholangiocarcinoma with two biliary drains in place admitted with malaise and fevers. She was initially ill appearing, hypoxic (but has ILD).  Interestingly lfts only mildly elevated, cxr shows chronic changes, flu pcr negative, ua negative. [place].  She does have a portacath in place but it is not tender or inflamed.  CT neg for abscess or evidence of a  biliary source of sepsis. 4/23- clinicaly much better today and fevers improving. WU negative so far.   Recommendations Can narrow to cipro  and flagyl at dc Would give dose of cefepime before leaving Thank you very much for the consult. Will follow with you.  DLeonel Ramsay  08/10/2017, 2:43 PM

## 2017-08-10 NOTE — Discharge Summary (Addendum)
[place] [facility] [place] NAME: Regina Hood    MR#:  [id number]  DATE OF BIRTH:  15-Apr-1941  DATE OF ADMISSION:  08/06/2017 ADMITTING PHYSICIAN: Lance Coon, MD  DATE OF DISCHARGE: 08/10/2017  PRIMARY CARE PHYSICIAN: Idelle Crouch, MD    ADMISSION DIAGNOSIS:  Severe sepsis ([place]) [A41.9, R65.20] Sepsis, due to unspecified organism ([place]) [A41.9]  DISCHARGE DIAGNOSIS:  Principal Problem: Acute on chronic hypoxic respiratory failure Active Problems:   Systemic lupus erythematosus (HCC)   Cholangiocarcinoma (HCC)   Anxiety and depression   Hypertension   Sarcoidosis   Type 2 diabetes mellitus without complication, without long-term current use of insulin ([place])   SECONDARY DIAGNOSIS:   Past Medical History:  Diagnosis Date  . Arthritis   . Cancer ([place])   . Cellulitis   . Collagen vascular disease ([place])   . COPD (chronic obstructive pulmonary disease) ([place])   . DDD (degenerative disc disease), lumbar   . Depression   . Diabetes mellitus   . Diverticulitis   . GERD (gastroesophageal reflux disease)   . GI bleed   . Headache(784.0)   . Hypertension   . Lupus ([place])   . Ulcerative colitis [facility])     HOSPITAL COURSE:   77 year old female with chronic respiratory failure on 2 L of oxygen and cholangiocarcinoma who presented due to fever.  1.  Acute on chronic hypoxic respiratory failure with underlying interstitial lung disease Patient will continue oxygen 2 L.  Sepsis was ruled out  2.  History of cholangiocarcinoma with biliary drain in place: Patient was empirically placed on antibiotics.  All her cultures have been negative.  Recommendations on discharge her ciprofloxacin and Flagyl for 5 days per ID.  3.  Sarcoidosis: Patient continue Plaquenil  4.  Diabetes: Continue ADA diet with outpatient regimen  5.  Hyponatremia: This has been stable  6.  SLE: Patient will continue on immunosuppressants  7.  Essential hypertension:  Patient continue metoprolol  Patient will have outpatient Palliative Care  DISCHARGE CONDITIONS AND DIET:   Stable for discharge on diabetic diet  CONSULTS OBTAINED:  Treatment Team:  Leonel Ramsay, MD  DRUG ALLERGIES:   Allergies  Allergen Reactions  . Penicillins Nausea Only    DISCHARGE MEDICATIONS:   Allergies as of 08/10/2017      Reactions   Penicillins Nausea Only      Medication List    STOP taking these medications   amLODipine 10 MG tablet Commonly known as:  NORVASC   loperamide 2 MG capsule Commonly known as:  IMODIUM   oxyCODONE 5 MG immediate release tablet Commonly known as:  Oxy IR/ROXICODONE   potassium chloride SA 20 MEQ tablet Commonly known as:  K-DUR,KLOR-CON   predniSONE 1 MG tablet Commonly known as:  DELTASONE   prochlorperazine 10 MG tablet Commonly known as:  COMPAZINE   zolpidem 10 MG tablet Commonly known as:  AMBIEN     TAKE these medications   acetaminophen 500 MG tablet Commonly known as:  TYLENOL Take 1,000 mg by mouth 3 (three) times daily.   azaTHIOprine 50 MG tablet Commonly known as:  IMURAN Take 50 mg by mouth daily.   ciprofloxacin 500 MG tablet Commonly known as:  CIPRO Take 1 tablet (500 mg total) by mouth 2 (two) times daily for 5 days.   diphenoxylate-atropine 2.5-0.025 MG tablet Commonly known as:  LOMOTIL Take 2 tablets by mouth 4 (four) times daily as needed for diarrhea or loose  stools.   Fluticasone-Salmeterol 100-50 MCG/DOSE Aepb Commonly known as:  ADVAIR DISKUS Inhale 1 puff into the lungs 2 (two) times daily.   lidocaine-prilocaine cream Commonly known as:  EMLA Apply 1 application topically as needed.   magnesium chloride 64 MG Tbec SR tablet Commonly known as:  SLOW-MAG Take 1 tablet (64 mg total) by mouth daily.   metFORMIN 500 MG tablet Commonly known as:  GLUCOPHAGE Take 500 mg by mouth 2 (two) times daily with a meal.   Metoprolol Tartrate 75 MG Tabs Take 75 mg by mouth 2  (two) times daily. What changed:    medication strength  how much to take   metroNIDAZOLE 500 MG tablet Commonly known as:  FLAGYL Take 1 tablet (500 mg total) by mouth every 8 (eight) hours for 5 days. What changed:  when to take this   Normal Saline Flush 0.9 % Soln 1 mL by Other route every 8 (eight) hours. Flush each biliary drain with 1 mL twice daily   ondansetron 8 MG tablet Commonly known as:  ZOFRAN TAKE ONE TABLET EVERY 12 HOURS AS NEEDED   pantoprazole 40 MG tablet Commonly known as:  PROTONIX Take 1 tablet (40 mg total) by mouth daily.   PLAQUENIL 200 MG tablet Generic drug:  hydroxychloroquine Take 200 mg by mouth 2 (two) times daily.   triamcinolone cream 0.1 % Commonly known as:  KENALOG Apply topically daily.   venlafaxine XR 75 MG 24 hr capsule Commonly known as:  EFFEXOR-XR Take 225 mg by mouth daily.   VITAMIN B-12 PO Take 1,000 mcg by mouth daily.         Today   CHIEF COMPLAINT:  Doing well this am no acute issues   VITAL SIGNS:  Blood pressure 121/73, pulse 93, temperature 99.5 F (37.5 C), temperature source Oral, resp. rate (!) 22, height 5' 2"  (1.575 m), weight 68.5 kg (151 lb), SpO2 99 %.   REVIEW OF SYSTEMS:  Review of Systems  Constitutional: Negative.  Negative for chills, fever and malaise/fatigue.  HENT: Negative.  Negative for ear discharge, ear pain, hearing loss, nosebleeds and sore throat.   Eyes: Negative.  Negative for blurred vision and pain.  Respiratory: Negative.  Negative for cough, hemoptysis, shortness of breath and wheezing.   Cardiovascular: Negative.  Negative for chest pain, palpitations and leg swelling.  Gastrointestinal: Negative.  Negative for abdominal pain, blood in stool, diarrhea, nausea and vomiting.  Genitourinary: Negative.  Negative for dysuria.  Musculoskeletal: Negative.  Negative for back pain.  Skin: Negative.   Neurological: Negative for dizziness, tremors, speech change, focal weakness,  seizures and headaches.  Endo/Heme/Allergies: Negative.  Does not bruise/bleed easily.  Psychiatric/Behavioral: Negative.  Negative for depression, hallucinations and suicidal ideas.     PHYSICAL EXAMINATION:  GENERAL:  77 y.o.-year-old patient lying in the bed with no acute distress.  NECK:  Supple, no jugular venous distention. No thyroid enlargement, no tenderness.  LUNGS: Decreased breath sounds without rhonchi or rale CARDIOVASCULAR: S1, S2 normal. No murmurs, rubs, or gallops.  ABDOMEN: Soft, non-tender, non-distended. Bowel sounds present. No organomegaly or mass.  Drains on R upper abd wall with no pain, redness at insertion site   EXTREMITIES: No pedal edema, cyanosis, or clubbing.  PSYCHIATRIC: The patient is alert and oriented x 3.  SKIN: No obvious rash, lesion, or ulcer.   DATA REVIEW:   CBC Recent Labs  Lab 08/08/17 0456  WBC 9.6  HGB 9.4*  HCT 28.0*  PLT 205  Chemistries  Recent Labs  Lab 08/03/17 1353 08/06/17 2329  08/10/17 0517  NA 132* 130*   < > 130*  K 3.7 3.7   < > 4.2  CL 101 98*   < > 99*  CO2 23 21*   < > 27  GLUCOSE 181* 178*   < > 148*  BUN 19 13   < > 26*  CREATININE 0.77 0.79   < > 0.63  CALCIUM 9.2 8.5*   < > 8.6*  MG 1.8  --   --   --   AST 38 58*  --   --   ALT 32 29  --   --   ALKPHOS 272* 241*  --   --   BILITOT 0.5 0.6  --   --    < > = values in this interval not displayed.    Cardiac Enzymes Recent Labs  Lab 08/06/17 2329 08/07/17 0405 08/07/17 1020  TROPONINI 0.03* 0.03* 0.04*    Microbiology Results  @[email or web address] @  RADIOLOGY:  Ct Abdomen Pelvis W Contrast  Result Date: 08/09/2017 CLINICAL DATA:  History of cholangiocarcinoma with fever. EXAM: CT ABDOMEN AND PELVIS WITH CONTRAST TECHNIQUE: Multidetector CT imaging of the abdomen and pelvis was performed using the standard protocol following bolus administration of intravenous contrast. CONTRAST:  161m ISOVUE-300 IOPAMIDOL (ISOVUE-300) INJECTION 61%  COMPARISON:  06/07/2017 FINDINGS: Lower chest: Stable dense pulmonary scarring changes and emphysema. Heart is normal in size. Calcifications noted at the mitral valve annulus. Hepatobiliary: Stable biliary drainage catheters. No complicating features. No intrahepatic biliary dilatation. Stable surgical changes from a partial hepatectomy. Stable air in the gallbladder. No intrahepatic abscess. Pancreas: No mass, inflammation or ductal dilatation. Spleen: Normal size.  No focal lesions. Adrenals/Urinary Tract: Stable mild nodularity of the left adrenal gland. Both kidneys are unremarkable and stable. The bladder appears normal. Stomach/Bowel: The stomach, duodenum, small bowel and colon are grossly normal. No acute inflammatory changes, mass lesions or obstructive findings. No evidence of bowel perforation. Contrast gets all the way to the colon. Moderate sigmoid diverticulosis. Vascular/Lymphatic: Stable aortic calcifications. No aneurysm or dissection. The branch vessels are patent. The major venous structures are patent. Stable retrocrural and retroperitoneal lymph nodes. Reproductive: The uterus is surgically absent. Left ovary is still present and appears normal. Small cyst is noted. The right ovary is not identified for certain. Other: No ascites. Surgical changes involving the anterior abdominal wall. Musculoskeletal: No significant bony findings. IMPRESSION: 1. Stable biliary drainage catheters. No complicating features. No biliary distention. 2. Stable surgical changes from partial hepatectomy and Roux-en-Y procedure. 3. No abdominal/pelvic abscess or evidence of perforation or obstruction. 4. Persistent retrocrural and retroperitoneal adenopathy. Electronically Signed   By: PMarijo SanesM.D.   On: 08/09/2017 11:29      Allergies as of 08/10/2017      Reactions   Penicillins Nausea Only      Medication List    STOP taking these medications   amLODipine 10 MG tablet Commonly known as:  NORVASC    loperamide 2 MG capsule Commonly known as:  IMODIUM   oxyCODONE 5 MG immediate release tablet Commonly known as:  Oxy IR/ROXICODONE   potassium chloride SA 20 MEQ tablet Commonly known as:  K-DUR,KLOR-CON   predniSONE 1 MG tablet Commonly known as:  DELTASONE   prochlorperazine 10 MG tablet Commonly known as:  COMPAZINE   zolpidem 10 MG tablet Commonly known as:  AMBIEN     TAKE these medications   acetaminophen 500  MG tablet Commonly known as:  TYLENOL Take 1,000 mg by mouth 3 (three) times daily.   azaTHIOprine 50 MG tablet Commonly known as:  IMURAN Take 50 mg by mouth daily.   ciprofloxacin 500 MG tablet Commonly known as:  CIPRO Take 1 tablet (500 mg total) by mouth 2 (two) times daily for 5 days.   diphenoxylate-atropine 2.5-0.025 MG tablet Commonly known as:  LOMOTIL Take 2 tablets by mouth 4 (four) times daily as needed for diarrhea or loose stools.   Fluticasone-Salmeterol 100-50 MCG/DOSE Aepb Commonly known as:  ADVAIR DISKUS Inhale 1 puff into the lungs 2 (two) times daily.   lidocaine-prilocaine cream Commonly known as:  EMLA Apply 1 application topically as needed.   magnesium chloride 64 MG Tbec SR tablet Commonly known as:  SLOW-MAG Take 1 tablet (64 mg total) by mouth daily.   metFORMIN 500 MG tablet Commonly known as:  GLUCOPHAGE Take 500 mg by mouth 2 (two) times daily with a meal.   Metoprolol Tartrate 75 MG Tabs Take 75 mg by mouth 2 (two) times daily. What changed:    medication strength  how much to take   metroNIDAZOLE 500 MG tablet Commonly known as:  FLAGYL Take 1 tablet (500 mg total) by mouth every 8 (eight) hours for 5 days. What changed:  when to take this   Normal Saline Flush 0.9 % Soln 1 mL by Other route every 8 (eight) hours. Flush each biliary drain with 1 mL twice daily   ondansetron 8 MG tablet Commonly known as:  ZOFRAN TAKE ONE TABLET EVERY 12 HOURS AS NEEDED   pantoprazole 40 MG tablet Commonly known  as:  PROTONIX Take 1 tablet (40 mg total) by mouth daily.   PLAQUENIL 200 MG tablet Generic drug:  hydroxychloroquine Take 200 mg by mouth 2 (two) times daily.   triamcinolone cream 0.1 % Commonly known as:  KENALOG Apply topically daily.   venlafaxine XR 75 MG 24 hr capsule Commonly known as:  EFFEXOR-XR Take 225 mg by mouth daily.   VITAMIN B-12 PO Take 1,000 mcg by mouth daily.          Management plans discussed with the patient and she is in agreement. Stable for discharge   Patient should follow up with pcp  CODE STATUS:     Code Status Orders  (From admission, onward)        Start     Ordered   08/07/17 0234  Do not attempt resuscitation (DNR)  Continuous    Question Answer Comment  In the event of cardiac or respiratory ARREST Do not call a "code blue"   In the event of cardiac or respiratory ARREST Do not perform Intubation, CPR, defibrillation or ACLS   In the event of cardiac or respiratory ARREST Use medication by any route, position, wound care, and other measures to relive pain and suffering. May use oxygen, suction and manual treatment of airway obstruction as needed for comfort.      08/07/17 0233    Code Status History    Date Active Date Inactive Code Status Order ID Comments User Context   06/07/2017 1308 06/09/2017 1715 DNR [id number]  Max Sane, MD Inpatient   06/07/2017 1200 06/07/2017 1308 Full Code [id number]  Max Sane, MD Inpatient   05/04/2017 1612 05/05/2017 2249 DNR [id number]  Bettey Costa, MD Inpatient   05/04/2017 1546 05/04/2017 1612 Full Code [id number]  Bettey Costa, MD Inpatient   04/27/2017 1826 04/30/2017 2200 DNR [id number]  Anselm Jungling,  Rosalio Macadamia, MD Inpatient   03/16/2017 1712 03/17/2017 1608 DNR [id number]  Henreitta Leber, MD Inpatient   11/13/2016 0301 11/14/2016 1412 DNR [id number]  Harrie Foreman, MD Inpatient   06/29/2016 1533 07/01/2016 1922 DNR [id number]  Hillary Bow, MD ED   09/17/2011 1443 09/19/2011 1624 Full Code [id number]   Corum, Jeani Sow, RN Inpatient    Advance Directive Documentation     Most Recent Value  Type of Advance Directive  Healthcare Power of [place], Out of facility DNR (pink MOST or yellow form)  Pre-existing out of facility DNR order (yellow form or pink MOST form)  Yellow form placed in chart (order not valid for inpatient use)  "MOST" Form in Place?  -      TOTAL TIME TAKING CARE OF THIS PATIENT: 38 minutes.    Note: This dictation was prepared with Dragon dictation along with smaller phrase technology. Any transcriptional errors that result from this process are unintentional.  Nashira Mcglynn M.D on 08/10/2017 at 1:16 PM  Between 7am to 6pm - Pager - [phone] After 6pm go to [email or web address] - password [email or web address] [facility]  Office  [phone]  CC: Primary care physician; Idelle Crouch, MD

## 2017-08-11 LAB — CULTURE, BLOOD (ROUTINE X 2)
Culture: NO GROWTH
Culture: NO GROWTH
SPECIAL REQUESTS: ADEQUATE
Special Requests: ADEQUATE

## 2017-08-12 ENCOUNTER — Other Ambulatory Visit: Payer: Self-pay

## 2017-08-12 ENCOUNTER — Emergency Department: Payer: PPO

## 2017-08-12 ENCOUNTER — Encounter: Payer: Self-pay | Admitting: Emergency Medicine

## 2017-08-12 ENCOUNTER — Inpatient Hospital Stay
Admission: EM | Admit: 2017-08-12 | Discharge: 2017-08-18 | DRG: 853 | Disposition: A | Discharge status: Home Health | Payer: PPO | Attending: Internal Medicine | Admitting: Internal Medicine

## 2017-08-12 DIAGNOSIS — R0602 Shortness of breath: Secondary | ICD-10-CM | POA: Diagnosis not present

## 2017-08-12 DIAGNOSIS — J440 Chronic obstructive pulmonary disease with acute lower respiratory infection: Secondary | ICD-10-CM | POA: Diagnosis not present

## 2017-08-12 DIAGNOSIS — A419 Sepsis, unspecified organism: Principal | ICD-10-CM | POA: Diagnosis present

## 2017-08-12 DIAGNOSIS — Z66 Do not resuscitate: Secondary | ICD-10-CM | POA: Diagnosis present

## 2017-08-12 DIAGNOSIS — J849 Interstitial pulmonary disease, unspecified: Secondary | ICD-10-CM | POA: Diagnosis not present

## 2017-08-12 DIAGNOSIS — I110 Hypertensive heart disease with heart failure: Secondary | ICD-10-CM | POA: Diagnosis not present

## 2017-08-12 DIAGNOSIS — R509 Fever, unspecified: Secondary | ICD-10-CM

## 2017-08-12 DIAGNOSIS — E871 Hypo-osmolality and hyponatremia: Secondary | ICD-10-CM | POA: Diagnosis not present

## 2017-08-12 DIAGNOSIS — D638 Anemia in other chronic diseases classified elsewhere: Secondary | ICD-10-CM | POA: Diagnosis present

## 2017-08-12 DIAGNOSIS — J181 Lobar pneumonia, unspecified organism: Secondary | ICD-10-CM

## 2017-08-12 DIAGNOSIS — R6520 Severe sepsis without septic shock: Secondary | ICD-10-CM | POA: Diagnosis not present

## 2017-08-12 DIAGNOSIS — K219 Gastro-esophageal reflux disease without esophagitis: Secondary | ICD-10-CM | POA: Diagnosis not present

## 2017-08-12 DIAGNOSIS — M5136 Other intervertebral disc degeneration, lumbar region: Secondary | ICD-10-CM | POA: Diagnosis present

## 2017-08-12 DIAGNOSIS — J9621 Acute and chronic respiratory failure with hypoxia: Secondary | ICD-10-CM | POA: Diagnosis present

## 2017-08-12 DIAGNOSIS — J441 Chronic obstructive pulmonary disease with (acute) exacerbation: Secondary | ICD-10-CM

## 2017-08-12 DIAGNOSIS — I5031 Acute diastolic (congestive) heart failure: Secondary | ICD-10-CM | POA: Diagnosis not present

## 2017-08-12 DIAGNOSIS — M1990 Unspecified osteoarthritis, unspecified site: Secondary | ICD-10-CM | POA: Diagnosis not present

## 2017-08-12 DIAGNOSIS — I509 Heart failure, unspecified: Secondary | ICD-10-CM | POA: Diagnosis not present

## 2017-08-12 DIAGNOSIS — Z515 Encounter for palliative care: Secondary | ICD-10-CM | POA: Diagnosis not present

## 2017-08-12 DIAGNOSIS — G9340 Encephalopathy, unspecified: Secondary | ICD-10-CM | POA: Diagnosis not present

## 2017-08-12 DIAGNOSIS — G9341 Metabolic encephalopathy: Secondary | ICD-10-CM | POA: Diagnosis present

## 2017-08-12 DIAGNOSIS — L932 Other local lupus erythematosus: Secondary | ICD-10-CM

## 2017-08-12 DIAGNOSIS — I248 Other forms of acute ischemic heart disease: Secondary | ICD-10-CM | POA: Diagnosis present

## 2017-08-12 DIAGNOSIS — K8309 Other cholangitis: Secondary | ICD-10-CM | POA: Diagnosis not present

## 2017-08-12 DIAGNOSIS — C221 Intrahepatic bile duct carcinoma: Secondary | ICD-10-CM | POA: Diagnosis present

## 2017-08-12 DIAGNOSIS — J449 Chronic obstructive pulmonary disease, unspecified: Secondary | ICD-10-CM | POA: Diagnosis not present

## 2017-08-12 DIAGNOSIS — Y95 Nosocomial condition: Secondary | ICD-10-CM | POA: Diagnosis present

## 2017-08-12 DIAGNOSIS — R05 Cough: Secondary | ICD-10-CM | POA: Diagnosis not present

## 2017-08-12 DIAGNOSIS — M329 Systemic lupus erythematosus, unspecified: Secondary | ICD-10-CM | POA: Diagnosis not present

## 2017-08-12 DIAGNOSIS — Z7189 Other specified counseling: Secondary | ICD-10-CM | POA: Diagnosis not present

## 2017-08-12 DIAGNOSIS — J189 Pneumonia, unspecified organism: Secondary | ICD-10-CM

## 2017-08-12 DIAGNOSIS — Z7952 Long term (current) use of systemic steroids: Secondary | ICD-10-CM | POA: Diagnosis not present

## 2017-08-12 DIAGNOSIS — R5381 Other malaise: Secondary | ICD-10-CM | POA: Diagnosis not present

## 2017-08-12 DIAGNOSIS — E876 Hypokalemia: Secondary | ICD-10-CM | POA: Diagnosis present

## 2017-08-12 DIAGNOSIS — Z9221 Personal history of antineoplastic chemotherapy: Secondary | ICD-10-CM

## 2017-08-12 DIAGNOSIS — Z880 Allergy status to penicillin: Secondary | ICD-10-CM

## 2017-08-12 DIAGNOSIS — Z87891 Personal history of nicotine dependence: Secondary | ICD-10-CM

## 2017-08-12 DIAGNOSIS — J811 Chronic pulmonary edema: Secondary | ICD-10-CM | POA: Diagnosis not present

## 2017-08-12 DIAGNOSIS — I214 Non-ST elevation (NSTEMI) myocardial infarction: Secondary | ICD-10-CM | POA: Diagnosis not present

## 2017-08-12 DIAGNOSIS — F329 Major depressive disorder, single episode, unspecified: Secondary | ICD-10-CM | POA: Diagnosis present

## 2017-08-12 DIAGNOSIS — Z7984 Long term (current) use of oral hypoglycemic drugs: Secondary | ICD-10-CM

## 2017-08-12 DIAGNOSIS — C249 Malignant neoplasm of biliary tract, unspecified: Secondary | ICD-10-CM | POA: Diagnosis not present

## 2017-08-12 DIAGNOSIS — R0902 Hypoxemia: Secondary | ICD-10-CM

## 2017-08-12 DIAGNOSIS — Z79899 Other long term (current) drug therapy: Secondary | ICD-10-CM

## 2017-08-12 DIAGNOSIS — D869 Sarcoidosis, unspecified: Secondary | ICD-10-CM | POA: Diagnosis present

## 2017-08-12 DIAGNOSIS — R4182 Altered mental status, unspecified: Secondary | ICD-10-CM | POA: Diagnosis not present

## 2017-08-12 DIAGNOSIS — Z9981 Dependence on supplemental oxygen: Secondary | ICD-10-CM

## 2017-08-12 DIAGNOSIS — R748 Abnormal levels of other serum enzymes: Secondary | ICD-10-CM | POA: Diagnosis not present

## 2017-08-12 DIAGNOSIS — E119 Type 2 diabetes mellitus without complications: Secondary | ICD-10-CM | POA: Diagnosis not present

## 2017-08-12 DIAGNOSIS — J9611 Chronic respiratory failure with hypoxia: Secondary | ICD-10-CM | POA: Diagnosis not present

## 2017-08-12 DIAGNOSIS — R609 Edema, unspecified: Secondary | ICD-10-CM

## 2017-08-12 DIAGNOSIS — J8410 Pulmonary fibrosis, unspecified: Secondary | ICD-10-CM | POA: Diagnosis present

## 2017-08-12 DIAGNOSIS — Z7951 Long term (current) use of inhaled steroids: Secondary | ICD-10-CM

## 2017-08-12 DIAGNOSIS — J984 Other disorders of lung: Secondary | ICD-10-CM | POA: Diagnosis not present

## 2017-08-12 LAB — COMPREHENSIVE METABOLIC PANEL
ALBUMIN: 2.7 g/dL — AB (ref 3.5–5.0)
ALT: 23 U/L (ref 14–54)
ANION GAP: 10 (ref 5–15)
AST: 55 U/L — ABNORMAL HIGH (ref 15–41)
Alkaline Phosphatase: 329 U/L — ABNORMAL HIGH (ref 38–126)
BILIRUBIN TOTAL: 0.8 mg/dL (ref 0.3–1.2)
BUN: 15 mg/dL (ref 6–20)
CO2: 26 mmol/L (ref 22–32)
Calcium: 8.8 mg/dL — ABNORMAL LOW (ref 8.9–10.3)
Chloride: 99 mmol/L — ABNORMAL LOW (ref 101–111)
Creatinine, Ser: 0.62 mg/dL (ref 0.44–1.00)
GFR calc Af Amer: >60 mL/min (ref >60)
GFR calc non Af Amer: >60 mL/min (ref >60)
Glucose, Bld: 76 mg/dL (ref 65–99)
POTASSIUM: 3.3 mmol/L — AB (ref 3.5–5.1)
Sodium: 135 mmol/L (ref 135–145)
TOTAL PROTEIN: 6.1 g/dL — AB (ref 6.5–8.1)

## 2017-08-12 LAB — CBC WITH DIFFERENTIAL/PLATELET
BASOS ABS: 0 10*3/uL (ref 0–0.1)
BASOS PCT: 0 %
EOS ABS: 0.1 10*3/uL (ref 0–0.7)
Eosinophils Relative: 1 %
HCT: 30 % — ABNORMAL LOW (ref 35.0–47.0)
HEMOGLOBIN: 9.8 g/dL — AB (ref 12.0–16.0)
Lymphocytes Relative: 1 %
Lymphs Abs: 0.1 10*3/uL — ABNORMAL LOW (ref 1.0–3.6)
MCH: 27.9 pg (ref 26.0–34.0)
MCHC: 32.7 g/dL (ref 32.0–36.0)
MCV: 85.5 fL (ref 80.0–100.0)
Monocytes Absolute: 0.1 10*3/uL — ABNORMAL LOW (ref 0.2–0.9)
Monocytes Relative: 1 %
Neutro Abs: 10 10*3/uL — ABNORMAL HIGH (ref 1.4–6.5)
Neutrophils Relative %: 97 %
Platelets: 214 10*3/uL (ref 150–440)
RBC: 3.51 MIL/uL — AB (ref 3.80–5.20)
RDW: 16.1 % — ABNORMAL HIGH (ref 11.5–14.5)
WBC: 10.3 10*3/uL (ref 3.6–11.0)

## 2017-08-12 LAB — BLOOD GAS, VENOUS
ACID-BASE DEFICIT: 6.9 mmol/L — AB (ref 0.0–2.0)
BICARBONATE: 18.5 mmol/L — AB (ref 20.0–28.0)
O2 Saturation: 94.8 %
PCO2 VEN: 36 mmHg — AB (ref 44.0–60.0)
PH VEN: 7.32 (ref 7.250–7.430)
Patient temperature: 37
pO2, Ven: 81 mmHg — ABNORMAL HIGH (ref 32.0–45.0)

## 2017-08-12 LAB — GLUCOSE, CAPILLARY: Glucose-Capillary: 134 mg/dL — ABNORMAL HIGH (ref 65–99)

## 2017-08-12 LAB — TROPONIN I: Troponin I: 0.05 ng/mL (ref <0.03)

## 2017-08-12 LAB — LIPASE, BLOOD: LIPASE: 77 U/L — AB (ref 11–51)

## 2017-08-12 LAB — LACTIC ACID, PLASMA
LACTIC ACID, VENOUS: 3.8 mmol/L — AB (ref 0.5–1.9)
Lactic Acid, Venous: 1.7 mmol/L (ref 0.5–1.9)

## 2017-08-12 MED ORDER — LEVOFLOXACIN IN D5W 750 MG/150ML IV SOLN: 750.0000 mg | Freq: Once | INTRAVENOUS | Status: DC

## 2017-08-12 MED ORDER — DOCUSATE SODIUM 100 MG PO CAPS
100.0000 mg | ORAL_CAPSULE | Freq: Two times a day (BID) | ORAL | Status: DC
  Administered 2017-08-12 – 2017-08-18 (×10): 100 mg via ORAL
  Filled 2017-08-12 (×11): qty 1

## 2017-08-12 MED ORDER — IPRATROPIUM-ALBUTEROL 0.5-2.5 (3) MG/3ML IN SOLN
3.0000 mL | Freq: Once | RESPIRATORY_TRACT | Status: AC
  Administered 2017-08-12: 3 mL via RESPIRATORY_TRACT
  Filled 2017-08-12: qty 3

## 2017-08-12 MED ORDER — TRAZODONE HCL 50 MG PO TABS
25.0000 mg | ORAL_TABLET | Freq: Every evening | ORAL | Status: DC | PRN
  Administered 2017-08-12 – 2017-08-17 (×4): 25 mg via ORAL
  Filled 2017-08-12 (×4): qty 1

## 2017-08-12 MED ORDER — ACETAMINOPHEN 650 MG RE SUPP: 650.0000 mg | Freq: Four times a day (QID) | RECTAL | Status: DC | PRN

## 2017-08-12 MED ORDER — INSULIN ASPART 100 UNIT/ML ~~LOC~~ SOLN
0.0000 [IU] | Freq: Every day | SUBCUTANEOUS | Status: DC
  Administered 2017-08-13: 2 [IU] via SUBCUTANEOUS
  Filled 2017-08-12: qty 1

## 2017-08-12 MED ORDER — VANCOMYCIN HCL IN DEXTROSE 1-5 GM/200ML-% IV SOLN
1000.0000 mg | Freq: Once | INTRAVENOUS | Status: AC
  Administered 2017-08-12: 1000 mg via INTRAVENOUS
  Filled 2017-08-12: qty 200

## 2017-08-12 MED ORDER — HEPARIN SODIUM (PORCINE) 5000 UNIT/ML IJ SOLN
5000.0000 [IU] | Freq: Three times a day (TID) | INTRAMUSCULAR | Status: DC
  Administered 2017-08-12 – 2017-08-18 (×16): 5000 [IU] via SUBCUTANEOUS
  Filled 2017-08-12 (×16): qty 1

## 2017-08-12 MED ORDER — METHYLPREDNISOLONE SODIUM SUCC 125 MG IJ SOLR
125.0000 mg | INTRAMUSCULAR | Status: AC
  Administered 2017-08-12: 125 mg via INTRAVENOUS
  Filled 2017-08-12: qty 2

## 2017-08-12 MED ORDER — ONDANSETRON HCL 4 MG/2ML IJ SOLN
4.0000 mg | Freq: Four times a day (QID) | INTRAMUSCULAR | Status: DC | PRN
  Administered 2017-08-17: 16:00:00 4 mg via INTRAVENOUS
  Filled 2017-08-12: qty 2

## 2017-08-12 MED ORDER — PANTOPRAZOLE SODIUM 40 MG PO TBEC
40.0000 mg | DELAYED_RELEASE_TABLET | Freq: Every day | ORAL | Status: DC
  Administered 2017-08-13 – 2017-08-18 (×6): 40 mg via ORAL
  Filled 2017-08-12 (×6): qty 1

## 2017-08-12 MED ORDER — AZATHIOPRINE 50 MG PO TABS
50.0000 mg | ORAL_TABLET | Freq: Every day | ORAL | Status: DC
  Administered 2017-08-13 – 2017-08-18 (×6): 50 mg via ORAL
  Filled 2017-08-12 (×6): qty 1

## 2017-08-12 MED ORDER — ONDANSETRON HCL 4 MG PO TABS: 4.0000 mg | ORAL_TABLET | Freq: Four times a day (QID) | ORAL | Status: DC | PRN

## 2017-08-12 MED ORDER — METRONIDAZOLE IN NACL 5-0.79 MG/ML-% IV SOLN
500.0000 mg | Freq: Three times a day (TID) | INTRAVENOUS | Status: DC
  Administered 2017-08-13 – 2017-08-15 (×9): 500 mg via INTRAVENOUS
  Filled 2017-08-12 (×10): qty 100

## 2017-08-12 MED ORDER — METRONIDAZOLE 500 MG PO TABS
500.0000 mg | ORAL_TABLET | ORAL | Status: AC
  Administered 2017-08-12: 500 mg via ORAL
  Filled 2017-08-12: qty 1

## 2017-08-12 MED ORDER — POTASSIUM CHLORIDE 10 MEQ/100ML IV SOLN
10.0000 meq | INTRAVENOUS | Status: AC
  Administered 2017-08-12 – 2017-08-13 (×5): 10 meq via INTRAVENOUS
  Filled 2017-08-12 (×5): qty 100

## 2017-08-12 MED ORDER — ACETAMINOPHEN 325 MG PO TABS
650.0000 mg | ORAL_TABLET | Freq: Once | ORAL | Status: AC
  Administered 2017-08-12: 650 mg via ORAL
  Filled 2017-08-12: qty 2

## 2017-08-12 MED ORDER — HYDROCODONE-ACETAMINOPHEN 5-325 MG PO TABS
1.0000 | ORAL_TABLET | ORAL | Status: DC | PRN
  Administered 2017-08-12 – 2017-08-14 (×7): 1 via ORAL
  Administered 2017-08-15 – 2017-08-17 (×9): 2 via ORAL
  Filled 2017-08-12 (×3): qty 1
  Filled 2017-08-12: qty 2
  Filled 2017-08-12: qty 1
  Filled 2017-08-12 (×3): qty 2
  Filled 2017-08-12: qty 1
  Filled 2017-08-12 (×3): qty 2
  Filled 2017-08-12 (×2): qty 1
  Filled 2017-08-12: qty 2

## 2017-08-12 MED ORDER — METOPROLOL TARTRATE 25 MG PO TABS
75.0000 mg | ORAL_TABLET | Freq: Two times a day (BID) | ORAL | Status: DC
  Administered 2017-08-12 – 2017-08-18 (×12): 75 mg via ORAL
  Filled 2017-08-12 (×12): qty 3

## 2017-08-12 MED ORDER — FUROSEMIDE 10 MG/ML IJ SOLN
40.0000 mg | Freq: Once | INTRAMUSCULAR | Status: AC
  Administered 2017-08-12: 40 mg via INTRAVENOUS
  Filled 2017-08-12: qty 4

## 2017-08-12 MED ORDER — VITAMIN B-12 1000 MCG PO TABS
1000.0000 ug | ORAL_TABLET | Freq: Every day | ORAL | Status: DC
  Administered 2017-08-13 – 2017-08-18 (×6): 1000 ug via ORAL
  Filled 2017-08-12 (×6): qty 1

## 2017-08-12 MED ORDER — VANCOMYCIN HCL IN DEXTROSE 1-5 GM/200ML-% IV SOLN
1000.0000 mg | Freq: Two times a day (BID) | INTRAVENOUS | Status: DC
  Administered 2017-08-13: 1000 mg via INTRAVENOUS
  Filled 2017-08-12 (×2): qty 200

## 2017-08-12 MED ORDER — INSULIN ASPART 100 UNIT/ML ~~LOC~~ SOLN
0.0000 [IU] | Freq: Three times a day (TID) | SUBCUTANEOUS | Status: DC
  Administered 2017-08-13: 1 [IU] via SUBCUTANEOUS
  Administered 2017-08-13: 2 [IU] via SUBCUTANEOUS
  Administered 2017-08-13: 12:00:00 3 [IU] via SUBCUTANEOUS
  Administered 2017-08-14: 2 [IU] via SUBCUTANEOUS
  Administered 2017-08-14: 18:00:00 1 [IU] via SUBCUTANEOUS
  Administered 2017-08-15: 2 [IU] via SUBCUTANEOUS
  Administered 2017-08-16: 1 [IU] via SUBCUTANEOUS
  Administered 2017-08-16: 13:00:00 2 [IU] via SUBCUTANEOUS
  Administered 2017-08-17 (×2): 1 [IU] via SUBCUTANEOUS
  Administered 2017-08-18: 2 [IU] via SUBCUTANEOUS
  Filled 2017-08-12 (×10): qty 1

## 2017-08-12 MED ORDER — ACETAMINOPHEN 325 MG PO TABS
650.0000 mg | ORAL_TABLET | Freq: Four times a day (QID) | ORAL | Status: DC | PRN
  Administered 2017-08-14: 20:00:00 650 mg via ORAL
  Filled 2017-08-12: qty 2

## 2017-08-12 MED ORDER — SODIUM CHLORIDE 0.9 % IV SOLN
1.0000 g | Freq: Two times a day (BID) | INTRAVENOUS | Status: DC
Start: 2017-08-13 — End: 2017-08-18
  Administered 2017-08-13 – 2017-08-18 (×11): 1 g via INTRAVENOUS
  Filled 2017-08-12 (×13): qty 1

## 2017-08-12 MED ORDER — SODIUM CHLORIDE 0.9 % IV SOLN
1.0000 g | INTRAVENOUS | Status: AC
  Administered 2017-08-12: 1 g via INTRAVENOUS
  Filled 2017-08-12: qty 1

## 2017-08-12 MED ORDER — VENLAFAXINE HCL ER 75 MG PO CP24
75.0000 mg | ORAL_CAPSULE | Freq: Every day | ORAL | Status: DC
  Administered 2017-08-13 – 2017-08-15 (×3): 75 mg via ORAL
  Filled 2017-08-12 (×3): qty 1

## 2017-08-12 MED ORDER — SODIUM CHLORIDE 0.9 % IV SOLN: 2.0000 g | Freq: Once | INTRAVENOUS | Status: DC

## 2017-08-12 MED ORDER — SODIUM CHLORIDE 0.9 % IV BOLUS
1000.0000 mL | Freq: Once | INTRAVENOUS | Status: AC
  Administered 2017-08-12: 1000 mL via INTRAVENOUS

## 2017-08-12 MED ORDER — SODIUM CHLORIDE 0.9 % IV SOLN
INTRAVENOUS | Status: DC
  Administered 2017-08-12: via INTRAVENOUS

## 2017-08-12 MED ORDER — HYDROXYCHLOROQUINE SULFATE 200 MG PO TABS
200.0000 mg | ORAL_TABLET | Freq: Two times a day (BID) | ORAL | Status: DC
  Administered 2017-08-12 – 2017-08-18 (×12): 200 mg via ORAL
  Filled 2017-08-12 (×13): qty 1

## 2017-08-12 MED ORDER — BISACODYL 5 MG PO TBEC: 5.0000 mg | DELAYED_RELEASE_TABLET | Freq: Every day | ORAL | Status: DC | PRN

## 2017-08-12 NOTE — ED Provider Notes (Signed)
[facility] Emergency Department Provider Note ____________________________________________   First MD Initiated Contact with Patient 08/12/17 2142     (approximate)  I have reviewed the triage vital signs and the nursing notes.   HISTORY  Chief Complaint Altered Mental Status and Fever  EM caveat: Patient reports she cannot recall many of the things asked.  Some slight confusion  HPI Regina Hood is a 77 y.o. female presents for evaluation of fever and fatigue  Patient reports she just saw the hospital.  Today started feeling "ill".  Hard time describing but reports an ongoing cough.  No shortness of breath.  May be some slight wheezing earlier.  No abdominal pain.  No abnormal drainage around her biliary drain.  Denies rash.  Denies headache.  Has lower back pain but reports is been chronic for years without change.   Past Medical History:  Diagnosis Date  . Arthritis   . Cancer ([place])   . Cellulitis   . Collagen vascular disease ([place])   . COPD (chronic obstructive pulmonary disease) ([place])   . DDD (degenerative disc disease), lumbar   . Depression   . Diabetes mellitus   . Diverticulitis   . GERD (gastroesophageal reflux disease)   . GI bleed   . Headache(784.0)   . Hypertension   . Lupus ([place])   . Ulcerative colitis [facility])     Patient Active Problem List   Diagnosis Date Noted  . Malnutrition of moderate degree 06/09/2017  . Generalized abdominal pain   . Hyperbilirubinemia   . Hypomagnesemia   . Hypokalemia 04/27/2017  . Proteinuria 03/31/2017  . COPD exacerbation ([place]) 03/16/2017  . Dehydration 01/19/2017  . History of iron deficiency anemia 12/29/2016  . Systemic lupus erythematosus ([place]) 12/29/2016  . Cholangiocarcinoma ([place]) 12/29/2016  . History of ulcerative colitis 12/29/2016  . Goals of care, counseling/discussion 12/29/2016  . Sepsis ([place]) 11/13/2016  . At risk for sepsis 09/29/2016  . Red blood cell antibody positive,  compatible PRBC difficult to obtain 09/22/2016  . Type 2 diabetes mellitus without complication, without long-term current use of insulin ([place]) 09/17/2016  . Liver mass, left lobe 08/23/2016  . Elevated liver enzymes 08/19/2016  . Pruritic rash 08/19/2016  . IDA (iron deficiency anemia) 07/18/2016  . Cellulitis of right upper extremity 07/05/2016  . History of rectal bleeding 07/05/2016  . Rectal bleeding 06/29/2016  . Hypertension 01/07/2016  . Anxiety and depression 09/26/2013  . DDD (degenerative disc disease) 09/26/2013  . Diabetes ([place]) 09/26/2013  . Sarcoidosis 09/26/2013  . UC (ulcerative colitis) ([place]) 09/26/2013  . Proximal humerus fracture, left, closed, initial encounter 09/17/2011    Past Surgical History:  Procedure Laterality Date  . ABDOMINAL HYSTERECTOMY    . BREAST BIOPSY Right   . COLONOSCOPY    . COLONOSCOPY WITH PROPOFOL N/A 08/18/2016   Procedure: COLONOSCOPY WITH PROPOFOL;  Surgeon: Manya Silvas, MD;  Location: [facility] ENDOSCOPY;  Service: Endoscopy;  Laterality: N/A;  . ESOPHAGOGASTRODUODENOSCOPY (EGD) WITH PROPOFOL N/A 08/18/2016   Procedure: ESOPHAGOGASTRODUODENOSCOPY (EGD) WITH PROPOFOL;  Surgeon: Manya Silvas, MD;  Location: [facility] ENDOSCOPY;  Service: Endoscopy;  Laterality: N/A;  . IR BILIARY DRAIN PLACEMENT WITH CHOLANGIOGRAM  06/08/2017  . ORIF HUMERUS FRACTURE  09/17/2011   Procedure: OPEN REDUCTION INTERNAL FIXATION (ORIF) PROXIMAL HUMERUS FRACTURE;  Surgeon: Augustin Schooling, MD;  Location: [place];  Service: Orthopedics;  Laterality: Left;    Prior to Admission medications   Medication Sig Start Date End Date Taking? Authorizing  Provider  acetaminophen (TYLENOL) 500 MG tablet Take 1,000 mg by mouth 3 (three) times daily.   Yes [provider]  azaTHIOprine (IMURAN) 50 MG tablet Take 50 mg by mouth daily.   Yes [provider]  ciprofloxacin (CIPRO) 500 MG tablet Take 1 tablet (500 mg total) by mouth 2 (two) times daily for 5 days.  08/10/17 08/15/17 Yes Mody, Ulice Bold, MD  Cyanocobalamin (VITAMIN B-12 PO) Take 1,000 mcg by mouth daily.   Yes [provider]  diphenoxylate-atropine (LOMOTIL) 2.5-0.025 MG tablet Take 2 tablets by mouth 4 (four) times daily as needed for diarrhea or loose stools. 07/07/17  Yes Burns, Wandra Feinstein, NP  Fluticasone-Salmeterol (ADVAIR DISKUS) 100-50 MCG/DOSE AEPB Inhale 1 puff into the lungs 2 (two) times daily. 07/18/17  Yes Flora Lipps, MD  hydroxychloroquine (PLAQUENIL) 200 MG tablet Take 200 mg by mouth 2 (two) times daily.    Yes [provider]  insulin glargine (LANTUS) 100 UNIT/ML injection Inject 5 Units into the skin daily.   Yes [provider]  lidocaine-prilocaine (EMLA) cream Apply 1 application topically as needed. 02/16/17  Yes Cammie Sickle, MD  magnesium chloride (SLOW-MAG) 64 MG TBEC SR tablet Take 1 tablet (64 mg total) by mouth daily. 07/13/17  Yes Burns, Wandra Feinstein, NP  metFORMIN (GLUCOPHAGE) 500 MG tablet Take 500 mg by mouth 2 (two) times daily with a meal.   Yes [provider]  Metoprolol Tartrate 75 MG TABS Take 75 mg by mouth 2 (two) times daily. 08/10/17  Yes Mody, Ulice Bold, MD  metroNIDAZOLE (FLAGYL) 500 MG tablet Take 1 tablet (500 mg total) by mouth every 8 (eight) hours for 5 days. 08/10/17 08/15/17 Yes Mody, Sital, MD  ondansetron (ZOFRAN) 8 MG tablet TAKE ONE TABLET EVERY 12 HOURS AS NEEDED Patient taking differently: Take 1 tablet [facility]) by mouth every 12 hours as needed for nausea and/or vomiting 04/06/17  Yes Earlie Server, MD  pantoprazole (PROTONIX) 40 MG tablet Take 1 tablet (40 mg total) by mouth daily. 05/23/17  Yes Earlie Server, MD  Sodium Chloride Flush (NORMAL SALINE FLUSH) 0.9 % SOLN 1 mL by Other route every 8 (eight) hours. Flush each biliary drain with 1 mL twice daily 06/24/17  Yes Earlie Server, MD  triamcinolone cream (KENALOG) 0.1 % Apply 1 application topically daily.  07/20/17  Yes [provider]  venlafaxine XR (EFFEXOR-XR) 75  MG 24 hr capsule Take 75 mg by mouth daily.    Yes [provider]    Allergies Penicillins  Family History  Problem Relation Age of Onset  . Breast cancer Mother 19  . COPD Father   . Prostate cancer Brother     Social History Social History   Tobacco Use  . Smoking status: Former Smoker    Packs/day: 1.00    Years: 10.00    Pack years: 10.00    Types: Cigarettes    Last attempt to quit: 12/30/1998    Years since quitting: 18.6  . Smokeless tobacco: Never Used  . Tobacco comment: quit smoking in 2003  Substance Use Topics  . Alcohol use: No  . Drug use: No    Review of Systems Constitutional: Fever and feeling fatigued  eyes: No visual changes. ENT: No sore throat. Cardiovascular: Denies chest pain. Respiratory: Denies shortness of breath. Gastrointestinal: No abdominal pain.  No nausea, no vomiting.  No diarrhea.  Genitourinary: Negative for dysuria. Musculoskeletal: See HPI Skin: Negative for rash. Neurological: Negative for headaches.    ____________________________________________  PHYSICAL EXAM:  VITAL SIGNS: ED Triage Vitals  Enc Vitals Group     BP 08/12/17 1852 (!) 119/51     Pulse Rate 08/12/17 1852 95     Resp 08/12/17 1900 (!) 24     Temp 08/12/17 1852 100.1 F (37.8 C)     Temp Source 08/12/17 1852 Oral     SpO2 08/12/17 1852 96 %     Weight 08/12/17 1856 151 lb (68.5 kg)     Height 08/12/17 1856 5' 2"  (1.575 m)     Head Circumference --      Peak Flow --      Pain Score 08/12/17 1855 6     Pain Loc --      Pain Edu? --      Excl. in [place]? --     Constitutional: Alert and oriented.  Moderately ill-appearing.  Somewhat erythematous cheeks bilaterally. Eyes: Conjunctivae are normal. Head: Atraumatic. Nose: No congestion/rhinnorhea. Mouth/Throat: Mucous membranes are dry. Neck: No stridor.   Cardiovascular: Tachycardic rate, regular rhythm. Grossly normal heart sounds.  Good peripheral circulation. Respiratory: Mild  tachypnea.  End-tidal CO2 is square wave form, registering about 18.  Patient lung sounds are clear except for some slight mild end expiratory wheeze in the lower bases bilateral.  No rales are noted.  Speaking in full sentences, but appears slightly tachypneic.  Mild accessory muscle use Gastrointestinal: Soft and nontender. No distention.  Biliary drain clean dry intact surround. Musculoskeletal: No lower extremity tenderness nor edema. Neurologic:  Normal speech and language. No gross focal neurologic deficits are appreciated.  Skin:  Skin is warm, dry and intact.  Feels hot to touch Psychiatric: Mood and affect are normal. Speech and behavior are normal.  ____________________________________________   LABS (all labs ordered are listed, but only abnormal results are displayed)  Labs Reviewed  LACTIC ACID, PLASMA - Abnormal; Notable for the following components:      Result Value   Lactic Acid, Venous 3.8 (*)    All other components within normal limits  COMPREHENSIVE METABOLIC PANEL - Abnormal; Notable for the following components:   Potassium 3.3 (*)    Chloride 99 (*)    Calcium 8.8 (*)    Total Protein 6.1 (*)    Albumin 2.7 (*)    AST 55 (*)    Alkaline Phosphatase 329 (*)    All other components within normal limits  LIPASE, BLOOD - Abnormal; Notable for the following components:   Lipase 77 (*)    All other components within normal limits  TROPONIN I - Abnormal; Notable for the following components:   Troponin I 0.05 (*)    All other components within normal limits  CBC WITH DIFFERENTIAL/PLATELET - Abnormal; Notable for the following components:   RBC 3.51 (*)    Hemoglobin 9.8 (*)    HCT 30.0 (*)    RDW 16.1 (*)    Neutro Abs 10.0 (*)    Lymphs Abs 0.1 (*)    Monocytes Absolute 0.1 (*)    All other components within normal limits  CULTURE, BLOOD (ROUTINE X 2)  CULTURE, BLOOD (ROUTINE X 2)  LACTIC ACID, PLASMA  URINALYSIS, ROUTINE W REFLEX MICROSCOPIC  BLOOD GAS,  VENOUS   ____________________________________________  EKG   ____________________________________________  RADIOLOGY  Dg Chest Portable 1 View  Result Date: 08/12/2017 CLINICAL DATA:  Shortness of breath. Altered mental status. Fever. Patient was seen here 2 days ago for respiratory failure and sepsis. EXAM: PORTABLE CHEST 1 VIEW  COMPARISON:  08/12/2017 FINDINGS: Cardiac enlargement with pulmonary vascular congestion. Increasing lung infiltrates likely representing increasing edema. Superimposed pneumonia may also be present. Small bilateral pleural effusions. No pneumothorax. Power port type right central venous catheter with tip over the cavoatrial junction region. Surgical clips in the right lung. Postoperative changes in the left shoulder. IMPRESSION: Cardiac enlargement with pulmonary vascular congestion. Increasing lung infiltrates bilaterally likely represent increasing edema. Superimposed pneumonia is also possible. Electronically Signed   By: Lucienne Capers M.D.   On: 08/12/2017 21:18   Dg Chest Port 1 View  Result Date: 08/12/2017 CLINICAL DATA:  Altered mental status and fever EXAM: PORTABLE CHEST 1 VIEW COMPARISON:  Chest radiograph 08/07/2017 FINDINGS: Power injectable right chest wall Port-A-Cath follows and internal jugular vein approach with tip in the lower SVC. There is cardiomegaly with enlarged pulmonary arteries. Bibasilar predominant interstitial opacities are unchanged. No pneumothorax or sizable pleural effusion. Postsurgical changes in the lower right lung. IMPRESSION: Unchanged appearance of the chest compared to 08/07/2017 radiograph with persistent cardiomegaly and enlarged pulmonary arteries. Bibasilar interstitial opacities are also unchanged. Electronically Signed   By: Ulyses Jarred M.D.   On: 08/12/2017 19:12    Chest x-ray reviewed.  Repeat chest x-ray reviewed, patient developing increased bilateral infiltrates concerning for  edema. ____________________________________________   PROCEDURES  Procedure(s) performed: None  Procedures  Critical Care performed: Yes, see critical care note(s)  CRITICAL CARE Performed by: Delman Kitten   Total critical care time: 40 minutes  Critical care time was exclusive of separately billable procedures and treating other patients.  Critical care was necessary to treat or prevent imminent or life-threatening deterioration.  Critical care was time spent personally by me on the following activities: development of treatment plan with patient and/or surrogate as well as nursing, discussions with consultants, evaluation of patient's response to treatment, examination of patient, obtaining history from patient or surrogate, ordering and performing treatments and interventions, ordering and review of laboratory studies, ordering and review of radiographic studies, pulse oximetry and re-evaluation of patient's condition.  Patient treated for severe sepsis, multiple antibiotics.  ID consult regarding antibiotic choice.  Fluid bolus, reevaluation for sepsis. ____________________________________________   INITIAL IMPRESSION / ASSESSMENT AND PLAN / ED COURSE  Pertinent labs & imaging results that were available during my care of the patient were reviewed by me and considered in my medical decision making (see chart for details).  Patient presents for evaluation with fever, generalized weakness ongoing cough.  Recent inpatient evaluation.  Recent treatment for sepsis.  I am highly concerned this could represent sepsis once again, she was discharged on Cipro and Flagyl and she had previous evaluation including CT regarding her biliary drain.  Today she reports fever and cough, and I am concerned this represents ongoing pulmonary infection.  Is no signs of this is lupus exacerbation.  No signs or symptoms of malignant hyperthermia, viral mental etiology, toxic or metabolic cause.  Will think  primarily hold it on infection.  Vital signs are reassuring except for tachycardia, also mild increased work of breathing and I suspect likely due to pneumonia.  Clinical Course as of Aug 12 2200  Fri Aug 12, 2017  1943 Case discussed with Dr. Ola Spurr (ID). Had recent CT abd/plv. Recommends keep vancomycin,    [MQ]  2142 Patient fluids discontinued.    [MQ]    Clinical Course User Index [MQ] Delman Kitten, MD   Sepsis - Repeat Assessment  Performed at:    10 PM  Vitals  Blood pressure (!) 118/55, pulse (!) 117, temperature 100.1 F (37.8 C), temperature source Oral, resp. rate (!) 30, height 5' 2"  (1.575 m), weight 68.5 kg (151 lb), SpO2 97 %.  Heart:     Tachycardic  Lungs:    Rales and Wheezing  Capillary Refill:   <2 sec  Peripheral Pulse:   Radial pulse palpable  Skin:     Normal Color  Patient noted to have some cysts increasing work of breathing.  Repeat chest x-ray concerning for increased volume, possibly developing some mild pulmonary edema.  Discussed with the patient she reports this seems to happen once in the past that she had to get Lasix.  Discussed with the hospitalist, will release the hospitalist order for Lasix.  IV fluids been stopped as patient seems to be developing pulmonary edema.  Discussed with the patient her goals of care, and she makes it very clear that she is DO NOT RESUSCITATE and would not wish to be on a ventilator.  We will continue with IV antibiotics, admission for concerns of sepsis.  Discussed with both the patient and her son my concerns regarding her condition, and she and her son are both understanding.     ____________________________________________   FINAL CLINICAL IMPRESSION(S) / ED DIAGNOSES  Final diagnoses:  Severe sepsis ([place])  Pneumonia of both lower lobes due to infectious organism ([place])  COPD exacerbation ([place])      NEW MEDICATIONS STARTED DURING THIS VISIT:  New Prescriptions   No medications on file      Note:  This document was prepared using Dragon voice recognition software and may include unintentional dictation errors.     Delman Kitten, MD 08/12/17 2215

## 2017-08-12 NOTE — ED Notes (Signed)
EDP to bedside to provide pt and family with update.

## 2017-08-12 NOTE — Progress Notes (Signed)
CODE SEPSIS - PHARMACY COMMUNICATION  **Broad Spectrum Antibiotics should be administered within 1 hour of Sepsis diagnosis**  Code sepsis entered 1851  Antibiotics Ordered: cefepime  Time of 1st antibiotic administration: 2012  Additional action taken by pharmacy: attempted to call MD but uncuccessful x 2  If necessary, Name of Provider/Nurse Contacted: Dr. Corky Mull, Heloise Beecham ,PharmD Clinical Pharmacist  08/12/2017  7:28 PM

## 2017-08-12 NOTE — H&P (Signed)
[facility] at [place] NAME: Regina Hood    MR#:  [id number]  DATE OF BIRTH:  03/16/1941  DATE OF ADMISSION:  08/12/2017  PRIMARY CARE PHYSICIAN: Idelle Crouch, MD   REQUESTING/REFERRING PHYSICIAN:   CHIEF COMPLAINT:   Chief Complaint  Patient presents with  . Altered Mental Status  . Fever    HISTORY OF PRESENT ILLNESS: Regina Hood  is a 77 y.o. female with a known history of interstitial lung disease, chronic respiratory failure on 2 L continuous oxygen at home, hypertension, SLE, cholangiocarcinoma with biliary drain in place. Patient is not able to provide history due to AMS.  Most of the information was obtained from reviewing medical records and from discussion with emergency room physician.  Patient was transferred from [facility] due to altered mental status and fever.  She was noted with productive cough, shortness of breath and wheezing.  She was just discharged from our hospital 2 days ago; patient was treated for possible sepsis and acute on chronic respiratory failure; discharged on Cipro and Flagyl p.o. At the arrival to emergency room patient was tachycardic with heart rate in the 120s and tachypneic with respiratory rate 27.  Temperature was 100.1.  Blood test done in the emergency room unremarkable for elevated lactic acid at 3.8 and troponin level at 0.05.  Potassium level is 3.3.  WBCs within normal limits. EKG is noted with sinus tachycardia but no acute ischemic changes. Chest x-ray, reviewed by myself, shows bilateral pulmonary infiltrates and pulmonary vascular congestion. Patient is admitted for further evaluation and treatment.   PAST MEDICAL HISTORY:   Past Medical History:  Diagnosis Date  . Arthritis   . Cancer ([place])   . Cellulitis   . Collagen vascular disease ([place])   . COPD (chronic obstructive pulmonary disease) ([place])   . DDD (degenerative disc disease), lumbar   . Depression   . Diabetes  mellitus   . Diverticulitis   . GERD (gastroesophageal reflux disease)   . GI bleed   . Headache(784.0)   . Hypertension   . Lupus ([place])   . Ulcerative colitis ([place])     PAST SURGICAL HISTORY:  Past Surgical History:  Procedure Laterality Date  . ABDOMINAL HYSTERECTOMY    . BREAST BIOPSY Right   . COLONOSCOPY    . COLONOSCOPY WITH PROPOFOL N/A 08/18/2016   Procedure: COLONOSCOPY WITH PROPOFOL;  Surgeon: Manya Silvas, MD;  Location: [facility] ENDOSCOPY;  Service: Endoscopy;  Laterality: N/A;  . ESOPHAGOGASTRODUODENOSCOPY (EGD) WITH PROPOFOL N/A 08/18/2016   Procedure: ESOPHAGOGASTRODUODENOSCOPY (EGD) WITH PROPOFOL;  Surgeon: Manya Silvas, MD;  Location: [facility] ENDOSCOPY;  Service: Endoscopy;  Laterality: N/A;  . IR BILIARY DRAIN PLACEMENT WITH CHOLANGIOGRAM  06/08/2017  . ORIF HUMERUS FRACTURE  09/17/2011   Procedure: OPEN REDUCTION INTERNAL FIXATION (ORIF) PROXIMAL HUMERUS FRACTURE;  Surgeon: Augustin Schooling, MD;  Location: [place];  Service: Orthopedics;  Laterality: Left;    SOCIAL HISTORY:  Social History   Tobacco Use  . Smoking status: Former Smoker    Packs/day: 1.00    Years: 10.00    Pack years: 10.00    Types: Cigarettes    Last attempt to quit: 12/30/1998    Years since quitting: 18.6  . Smokeless tobacco: Never Used  . Tobacco comment: quit smoking in 2003  Substance Use Topics  . Alcohol use: No    FAMILY HISTORY:  Family History  Problem Relation Age of Onset  . Breast  cancer Mother 73  . COPD Father   . Prostate cancer Brother     DRUG ALLERGIES:  Allergies  Allergen Reactions  . Penicillins Nausea Only    REVIEW OF SYSTEMS:   CONSTITUTIONAL: Positive for fever, fatigue and generalized weakness.  EYES: No blurred or double vision.  EARS, NOSE, AND THROAT: No tinnitus or ear pain.  RESPIRATORY: Positive for productive cough, shortness of breath, wheezing, no hemoptysis.  CARDIOVASCULAR: No chest pain, orthopnea, edema.  GASTROINTESTINAL: No nausea,  vomiting, diarrhea or abdominal pain.  GENITOURINARY: No dysuria, hematuria.  ENDOCRINE: No polyuria, nocturia,  HEMATOLOGY: No bleeding SKIN: No rash or lesion. MUSCULOSKELETAL: No joint pain.   NEUROLOGIC: No focal weakness.  PSYCHIATRY: No anxiety or depression.   MEDICATIONS AT HOME:  Prior to Admission medications   Medication Sig Start Date End Date Taking? Authorizing Provider  acetaminophen (TYLENOL) 500 MG tablet Take 1,000 mg by mouth 3 (three) times daily.   Yes [provider]  azaTHIOprine (IMURAN) 50 MG tablet Take 50 mg by mouth daily.   Yes [provider]  ciprofloxacin (CIPRO) 500 MG tablet Take 1 tablet (500 mg total) by mouth 2 (two) times daily for 5 days. 08/10/17 08/15/17 Yes Mody, Ulice Bold, MD  Cyanocobalamin (VITAMIN B-12 PO) Take 1,000 mcg by mouth daily.   Yes [provider]  diphenoxylate-atropine (LOMOTIL) 2.5-0.025 MG tablet Take 2 tablets by mouth 4 (four) times daily as needed for diarrhea or loose stools. 07/07/17  Yes Burns, Wandra Feinstein, NP  Fluticasone-Salmeterol (ADVAIR DISKUS) 100-50 MCG/DOSE AEPB Inhale 1 puff into the lungs 2 (two) times daily. 07/18/17  Yes Flora Lipps, MD  hydroxychloroquine (PLAQUENIL) 200 MG tablet Take 200 mg by mouth 2 (two) times daily.    Yes [provider]  insulin glargine (LANTUS) 100 UNIT/ML injection Inject 5 Units into the skin daily.   Yes [provider]  lidocaine-prilocaine (EMLA) cream Apply 1 application topically as needed. 02/16/17  Yes Cammie Sickle, MD  magnesium chloride (SLOW-MAG) 64 MG TBEC SR tablet Take 1 tablet (64 mg total) by mouth daily. 07/13/17  Yes Burns, Wandra Feinstein, NP  metFORMIN (GLUCOPHAGE) 500 MG tablet Take 500 mg by mouth 2 (two) times daily with a meal.   Yes [provider]  Metoprolol Tartrate 75 MG TABS Take 75 mg by mouth 2 (two) times daily. 08/10/17  Yes Mody, Ulice Bold, MD  metroNIDAZOLE (FLAGYL) 500 MG tablet Take 1 tablet (500 mg total)  by mouth every 8 (eight) hours for 5 days. 08/10/17 08/15/17 Yes Mody, Sital, MD  ondansetron (ZOFRAN) 8 MG tablet TAKE ONE TABLET EVERY 12 HOURS AS NEEDED Patient taking differently: Take 1 tablet [facility]) by mouth every 12 hours as needed for nausea and/or vomiting 04/06/17  Yes Earlie Server, MD  pantoprazole (PROTONIX) 40 MG tablet Take 1 tablet (40 mg total) by mouth daily. 05/23/17  Yes Earlie Server, MD  Sodium Chloride Flush (NORMAL SALINE FLUSH) 0.9 % SOLN 1 mL by Other route every 8 (eight) hours. Flush each biliary drain with 1 mL twice daily 06/24/17  Yes Earlie Server, MD  triamcinolone cream (KENALOG) 0.1 % Apply 1 application topically daily.  07/20/17  Yes [provider]  venlafaxine XR (EFFEXOR-XR) 75 MG 24 hr capsule Take 75 mg by mouth daily.    Yes [provider]      PHYSICAL EXAMINATION:   VITAL SIGNS: Blood pressure (!) 118/55, pulse (!) 117, temperature 100.1 F (37.8 C), temperature source Oral, resp. rate Marland Kitchen)  30, height 5' 2"  (1.575 m), weight 68.5 kg (151 lb), SpO2 97 %.  GENERAL:  77 y.o.-year-old patient lying in the bed with mild respiratory distress.  EYES: Pupils equal, round, reactive to light and accommodation. No scleral icterus. HEENT: Head atraumatic, normocephalic. Oropharynx and nasopharynx clear.  NECK:  Supple, no jugular venous distention. No thyroid enlargement, no tenderness.  LUNGS: Reduced breath sounds and scattered wheezing noted bilaterally. No use of accessory muscles of respiration.  CARDIOVASCULAR: S1, S2 normal. No S3/S4.  ABDOMEN: Soft, nontender, nondistended. Bowel sounds present.  Biliary drain, in place.  EXTREMITIES: No pedal edema, cyanosis, or clubbing.  NEUROLOGIC: No focal weakness.  PSYCHIATRIC: The patient is alert, but confused, lethargic.  SKIN: No obvious rash, lesion, or ulcer.   LABORATORY PANEL:   CBC Recent Labs  Lab 08/06/17 2329 08/07/17 0405 08/08/17 0456 08/12/17 1904  WBC 13.1* 11.6* 9.6 10.3  HGB 10.1* 8.7*  9.4* 9.8*  HCT 31.1* 26.3* 28.0* 30.0*  PLT 231 209 205 214  MCV 85.3 86.0 85.4 85.5  MCH 27.8 28.4 28.7 27.9  MCHC 32.6 33.0 33.7 32.7  RDW 15.3* 15.4* 15.4* 16.1*  LYMPHSABS 0.1*  --   --  0.1*  MONOABS 0.8  --   --  0.1*  EOSABS 0.1  --   --  0.1  BASOSABS 0.0  --   --  0.0   ------------------------------------------------------------------------------------------------------------------  Chemistries  Recent Labs  Lab 08/06/17 2329 08/07/17 0405 08/08/17 0456 08/09/17 0435 08/10/17 0517 08/12/17 1904  NA 130* 130* 133* 129* 130* 135  K 3.7 3.7 3.5 3.4* 4.2 3.3*  CL 98* 102 101 99* 99* 99*  CO2 21* 24 25 25 27 26   GLUCOSE 178* 122* 93 93 148* 76  BUN 13 17 19  23* 26* 15  CREATININE 0.79 0.83 0.57 0.63 0.63 0.62  CALCIUM 8.5* 7.7* 8.2* 8.0* 8.6* 8.8*  AST 58*  --   --   --   --  55*  ALT 29  --   --   --   --  23  ALKPHOS 241*  --   --   --   --  329*  BILITOT 0.6  --   --   --   --  0.8   ------------------------------------------------------------------------------------------------------------------ estimated creatinine clearance is 54.3 mL/min (by C-G formula based on SCr of 0.62 mg/dL). ------------------------------------------------------------------------------------------------------------------ No results for input(s): TSH, T4TOTAL, T3FREE, THYROIDAB in the last 72 hours.  Invalid input(s): FREET3   Coagulation profile Recent Labs  Lab 08/06/17 2329  INR 1.09   ------------------------------------------------------------------------------------------------------------------- No results for input(s): DDIMER in the last 72 hours. -------------------------------------------------------------------------------------------------------------------  Cardiac Enzymes Recent Labs  Lab 08/07/17 0405 08/07/17 1020 08/12/17 1904  TROPONINI 0.03* 0.04* 0.05*    ------------------------------------------------------------------------------------------------------------------ Invalid input(s): POCBNP  ---------------------------------------------------------------------------------------------------------------  Urinalysis    Component Value Date/Time   COLORURINE AMBER (A) 08/06/2017 2328   APPEARANCEUR CLEAR (A) 08/06/2017 2328   LABSPEC 1.016 08/06/2017 2328   PHURINE 5.0 08/06/2017 2328   GLUCOSEU NEGATIVE 08/06/2017 2328   HGBUR MODERATE (A) 08/06/2017 2328   BILIRUBINUR NEGATIVE 08/06/2017 2328   KETONESUR 5 (A) 08/06/2017 2328   PROTEINUR 100 (A) 08/06/2017 2328   NITRITE NEGATIVE 08/06/2017 2328   LEUKOCYTESUR NEGATIVE 08/06/2017 2328     RADIOLOGY: Dg Chest Portable 1 View  Result Date: 08/12/2017 CLINICAL DATA:  Shortness of breath. Altered mental status. Fever. Patient was seen here 2 days ago for respiratory failure and sepsis. EXAM: PORTABLE CHEST 1 VIEW COMPARISON:  08/12/2017  FINDINGS: Cardiac enlargement with pulmonary vascular congestion. Increasing lung infiltrates likely representing increasing edema. Superimposed pneumonia may also be present. Small bilateral pleural effusions. No pneumothorax. Power port type right central venous catheter with tip over the cavoatrial junction region. Surgical clips in the right lung. Postoperative changes in the left shoulder. IMPRESSION: Cardiac enlargement with pulmonary vascular congestion. Increasing lung infiltrates bilaterally likely represent increasing edema. Superimposed pneumonia is also possible. Electronically Signed   By: Lucienne Capers M.D.   On: 08/12/2017 21:18   Dg Chest Port 1 View  Result Date: 08/12/2017 CLINICAL DATA:  Altered mental status and fever EXAM: PORTABLE CHEST 1 VIEW COMPARISON:  Chest radiograph 08/07/2017 FINDINGS: Power injectable right chest wall Port-A-Cath follows and internal jugular vein approach with tip in the lower SVC. There is cardiomegaly  with enlarged pulmonary arteries. Bibasilar predominant interstitial opacities are unchanged. No pneumothorax or sizable pleural effusion. Postsurgical changes in the lower right lung. IMPRESSION: Unchanged appearance of the chest compared to 08/07/2017 radiograph with persistent cardiomegaly and enlarged pulmonary arteries. Bibasilar interstitial opacities are also unchanged. Electronically Signed   By: Ulyses Jarred M.D.   On: 08/12/2017 19:12    EKG: Orders placed or performed during the hospital encounter of 08/12/17  . ED EKG 12-Lead  . ED EKG 12-Lead    IMPRESSION AND PLAN:    1.  Acute on chronic hypoxic respiratory failure, likely secondary to bilateral pneumonia, and this patient with underlying interstitial lung disease.  We will start broad-spectrum IV antibiotics, IV steroids, oxygen therapy nebulizer treatment.  2.  Sepsis, likely secondary to bilateral pneumonia.  See treatment as above under #1.  Blood cultures and urine culture are pending final result.  Infectious disease is consulted for further evaluation and treatment.  3. HCAP, started on cefepime and vancomycin IV.  Continue oxygen therapy and nebulizer treatments.  4. NSTEMI.  Troponin level is slightly elevated at 0.05.  This is likely related to demand ischemia, due to acute on chronic respiratory failure.  We will continue to monitor patient on telemetry and follow troponin levels to rule out ACS.  5.  Acute encephalopathy, likely secondary to acute on chronic respiratory failure and sepsis.  We will continue to monitor clinically closely, while treating the underlying disease.  6.  History of cholangiocarcinoma with biliary drain in place.  We will add Flagyl to vancomycin cefepime, per infectious disease recommendations.  7.  Sarcoidosis,  continue Plaquenil.  8.  Diabetes: Continue ADA diet with outpatient regimen.  Monitor blood sugars before meals and at bedtime.  9.    Hypokalemia, will replace K per  protocol.  10.  SLE, continue maintenance medications.  11.  Essential hypertension, stable, continue metoprolol    All the records are reviewed and case discussed with ED provider. Management plans discussed with the patient and she is in agreement.  CODE STATUS: Code Status History    Date Active Date Inactive Code Status Order ID Comments User Context   08/07/2017 0233 08/10/2017 2140 DNR [id number]  Lance Coon, MD ED   06/07/2017 1308 06/09/2017 1715 DNR [id number]  Max Sane, MD Inpatient   06/07/2017 1200 06/07/2017 1308 Full Code [id number]  Max Sane, MD Inpatient   05/04/2017 1612 05/05/2017 2249 DNR [id number]  Bettey Costa, MD Inpatient   05/04/2017 1546 05/04/2017 1612 Full Code [id number]  Bettey Costa, MD Inpatient   04/27/2017 1826 04/30/2017 2200 DNR [id number]  Vaughan Basta, MD Inpatient   03/16/2017 1712 03/17/2017 1608 DNR [id number]  Abel Presto  J, MD Inpatient   11/13/2016 0301 11/14/2016 1412 DNR [id number]  Harrie Foreman, MD Inpatient   06/29/2016 1533 07/01/2016 1922 DNR [id number]  Hillary Bow, MD ED   09/17/2011 1443 09/19/2011 1624 Full Code [id number]  Corum, Jeani Sow, RN Inpatient    Questions for Most Recent Historical Code Status (Order [id number])    Question Answer Comment   In the event of cardiac or respiratory ARREST Do not call a "code blue"    In the event of cardiac or respiratory ARREST Do not perform Intubation, CPR, defibrillation or ACLS    In the event of cardiac or respiratory ARREST Use medication by any route, position, wound care, and other measures to relive pain and suffering. May use oxygen, suction and manual treatment of airway obstruction as needed for comfort.        TOTAL TIME TAKING CARE OF THIS PATIENT: 71 minutes.   m  Amelia Jo M.D on 08/12/2017 at 9:43 PM  Between 7am to 6pm - Pager - [phone]  After 6pm go to [email or web address] - password [email or web address] [facility]  Office   [phone]  CC: Primary care physician; Idelle Crouch, MD

## 2017-08-12 NOTE — ED Triage Notes (Signed)
Pt ems from [facility] for AMS and fever. Pt seen here 2 days ago for respiratory failure and sepsis

## 2017-08-12 NOTE — Progress Notes (Signed)
MEDICATION RELATED CONSULT NOTE - INITIAL   Pharmacy Consult for electrolyte management Indication: hypokalemia  Allergies  Allergen Reactions  . Penicillins Nausea Only    Patient Measurements: Height: 5' 2"  (157.5 cm) Weight: 151 lb (68.5 kg) IBW/kg (Calculated) : 50.1  Vital Signs: Temp: 97.9 F (36.6 C) (04/26 2302) Temp Source: Oral (04/26 2302) BP: 104/57 (04/26 2302) Pulse Rate: 102 (04/26 2302) Intake/Output from previous day: No intake/output data recorded. Intake/Output from this shift: Total I/O In: 1300 [IV Piggyback:1300] Out: -   Labs: Recent Labs    08/10/17 0517 08/12/17 1904  WBC  --  10.3  HGB  --  9.8*  HCT  --  30.0*  PLT  --  214  CREATININE 0.63 0.62  ALBUMIN  --  2.7*  PROT  --  6.1*  AST  --  55*  ALT  --  23  ALKPHOS  --  329*  BILITOT  --  0.8   Estimated Creatinine Clearance: 54.3 mL/min (by C-G formula based on SCr of 0.62 mg/dL).   Microbiology: Recent Results (from the past 720 hour(s))  Blood Culture (routine x 2)     Status: None   Collection Time: 08/06/17 11:28 PM  Result Value Ref Range Status   Specimen Description BLOOD BLOOD RIGHT HAND  Final   Special Requests   Final    BOTTLES DRAWN AEROBIC AND ANAEROBIC Blood Culture adequate volume   Culture   Final    NO GROWTH 5 DAYS Performed at [facility], [address] [place], [place] [address]    Report Status 08/11/2017 FINAL  Final  Blood Culture (routine x 2)     Status: None   Collection Time: 08/06/17 11:28 PM  Result Value Ref Range Status   Specimen Description BLOOD BLOOD LEFT HAND  Final   Special Requests   Final    BOTTLES DRAWN AEROBIC AND ANAEROBIC Blood Culture adequate volume   Culture   Final    NO GROWTH 5 DAYS Performed at [facility], [address] [place], [place] [address]    Report Status 08/11/2017 FINAL  Final  Urine culture     Status: None   Collection Time: 08/06/17 11:28 PM  Result Value Ref Range Status   Specimen Description   Final    URINE, RANDOM Performed at [facility], [address], [place], [place] [address]    Special Requests   Final    NONE Performed at [facility], [address], [place], [place] [address]    Culture   Final    NO GROWTH Performed at [facility], [place] [address] [place], [place] [address]    Report Status 08/08/2017 FINAL  Final    Medical History: Past Medical History:  Diagnosis Date  . Arthritis   . Cancer ([place])   . Cellulitis   . Collagen vascular disease ([place])   . COPD (chronic obstructive pulmonary disease) ([place])   . DDD (degenerative disc disease), lumbar   . Depression   . Diabetes mellitus   . Diverticulitis   . GERD (gastroesophageal reflux disease)   . GI bleed   . Headache(784.0)   . Hypertension   . Lupus ([place])   . Ulcerative colitis ([place])     Medications:  Scheduled:  . [START ON 08/13/2017] azaTHIOprine  50 mg Oral Daily  . docusate sodium  100 mg Oral BID  . furosemide  40 mg Intravenous Once  . heparin  5,000 Units Subcutaneous Q8H  . hydroxychloroquine  200 mg Oral BID  . insulin aspart  0-5 Units Subcutaneous QHS  . [START ON 08/13/2017] insulin aspart  0-9 Units Subcutaneous TID WC  . metoprolol tartrate  75 mg Oral BID  . [START ON 08/13/2017] pantoprazole  40 mg Oral Daily  . [START ON 08/13/2017] venlafaxine XR  75 mg Oral Daily  . [START ON 08/13/2017] vitamin B-12  1,000 mcg Oral Daily    Assessment: Patient admitted for PNA found to be hypokalemic w/ K+ 3.3  Goal of Therapy:  K+ 3.5 - 4.5  Plan:  Will replace w/ KCI 10 mEq IV x 5 and will recheck electrolytes w/ am labs.  Tobie Lords, PharmD, BCPS Clinical Pharmacist 08/12/2017

## 2017-08-12 NOTE — ED Notes (Signed)
Hospitalist to bedside at this time 

## 2017-08-12 NOTE — Progress Notes (Signed)
Pharmacy Antibiotic Note  Regina Hood is a 77 y.o. female admitted on 08/12/2017 with pneumonia.  Pharmacy has been consulted for vanc/cefepime dosing.  Plan: Patient received vanc 1g and cefepime 1g IV x 1  Will continue vanc 1g IV q12h w/ 6 hour stack Will draw vanc trough 04/28 @ 1400 prior to 4th dose Will continue cefepime 1g IV q12h  Ke 0.0494 T1/2 14 ~ 12 hrs Goal trough 15 - 20 mcg/mL  Height: 5' 2"  (157.5 cm) Weight: 151 lb (68.5 kg) IBW/kg (Calculated) : 50.1  Temp (24hrs), Avg:100.1 F (37.8 C), Min:100.1 F (37.8 C), Max:100.1 F (37.8 C)  Recent Labs  Lab 08/06/17 2329 08/07/17 0215 08/07/17 0405 08/07/17 1128 08/08/17 0456 08/09/17 0435 08/10/17 0517 08/12/17 1904  WBC 13.1*  --  11.6*  --  9.6  --   --  10.3  CREATININE 0.79  --  0.83  --  0.57 0.63 0.63 0.62  LATICACIDVEN 5.3* 2.2*  --  0.7  --   --   --  3.8*    Estimated Creatinine Clearance: 54.3 mL/min (by C-G formula based on SCr of 0.62 mg/dL).    Allergies  Allergen Reactions  . Penicillins Nausea Only    Thank you for allowing pharmacy to be a part of this patient's care.  Tobie Lords, PharmD, BCPS Clinical Pharmacist 08/12/2017

## 2017-08-12 NOTE — ED Notes (Signed)
Pt ambulatory to toilet with on assist, unable to urinate at this time.  Will attempt for urine specimen at a later time.

## 2017-08-13 LAB — URINALYSIS, ROUTINE W REFLEX MICROSCOPIC
Bilirubin Urine: NEGATIVE
Glucose, UA: NEGATIVE mg/dL
Hgb urine dipstick: NEGATIVE
Ketones, ur: NEGATIVE mg/dL
NITRITE: NEGATIVE
PROTEIN: NEGATIVE mg/dL
SPECIFIC GRAVITY, URINE: 1.011 (ref 1.005–1.030)
pH: 5 (ref 5.0–8.0)

## 2017-08-13 LAB — BASIC METABOLIC PANEL
ANION GAP: 6 (ref 5–15)
BUN: 16 mg/dL (ref 6–20)
CO2: 27 mmol/L (ref 22–32)
Calcium: 7.9 mg/dL — ABNORMAL LOW (ref 8.9–10.3)
Chloride: 101 mmol/L (ref 101–111)
Creatinine, Ser: 0.61 mg/dL (ref 0.44–1.00)
Glucose, Bld: 196 mg/dL — ABNORMAL HIGH (ref 65–99)
POTASSIUM: 4.5 mmol/L (ref 3.5–5.1)
SODIUM: 134 mmol/L — AB (ref 135–145)

## 2017-08-13 LAB — GLUCOSE, CAPILLARY
GLUCOSE-CAPILLARY: 202 mg/dL — AB (ref 65–99)
Glucose-Capillary: 160 mg/dL — ABNORMAL HIGH (ref 65–99)
Glucose-Capillary: 207 mg/dL — ABNORMAL HIGH (ref 65–99)
Glucose-Capillary: 146 mg/dL — ABNORMAL HIGH (ref 65–99)

## 2017-08-13 LAB — CBC
HCT: 26.2 % — ABNORMAL LOW (ref 35.0–47.0)
HEMOGLOBIN: 8.5 g/dL — AB (ref 12.0–16.0)
MCH: 27.8 pg (ref 26.0–34.0)
MCHC: 32.5 g/dL (ref 32.0–36.0)
MCV: 85.6 fL (ref 80.0–100.0)
Platelets: 170 10*3/uL (ref 150–440)
RBC: 3.06 MIL/uL — ABNORMAL LOW (ref 3.80–5.20)
RDW: 15.9 % — AB (ref 11.5–14.5)
WBC: 11.6 10*3/uL — AB (ref 3.6–11.0)

## 2017-08-13 LAB — MRSA PCR SCREENING: MRSA by PCR: NEGATIVE

## 2017-08-13 LAB — LACTIC ACID, PLASMA
LACTIC ACID, VENOUS: 1.3 mmol/L (ref 0.5–1.9)
LACTIC ACID, VENOUS: 1.1 mmol/L (ref 0.5–1.9)

## 2017-08-13 LAB — PROCALCITONIN: PROCALCITONIN: 4.51 ng/mL

## 2017-08-13 LAB — TROPONIN I: Troponin I: 0.03 ng/mL (ref <0.03)

## 2017-08-13 MED ORDER — PREDNISONE 50 MG PO TABS
50.0000 mg | ORAL_TABLET | Freq: Every day | ORAL | Status: DC
Start: 2017-08-14 — End: 2017-08-13

## 2017-08-13 MED ORDER — LOPERAMIDE HCL 2 MG PO CAPS
2.0000 mg | ORAL_CAPSULE | ORAL | Status: DC | PRN
  Administered 2017-08-13 (×2): 2 mg via ORAL
  Filled 2017-08-13 (×2): qty 1

## 2017-08-13 MED ORDER — METHYLPREDNISOLONE SODIUM SUCC 125 MG IJ SOLR
80.0000 mg | Freq: Two times a day (BID) | INTRAMUSCULAR | Status: DC
  Administered 2017-08-13: 80 mg via INTRAVENOUS
  Filled 2017-08-13: qty 2

## 2017-08-13 MED ORDER — LORAZEPAM 2 MG/ML IJ SOLN
0.5000 mg | Freq: Once | INTRAMUSCULAR | Status: AC
  Administered 2017-08-13: 02:00:00 0.5 mg via INTRAVENOUS
  Filled 2017-08-13: qty 1

## 2017-08-13 NOTE — Clinical Social Work Note (Signed)
Clinical Social Work Assessment  Patient Details  Name: Regina Hood MRN: [id number] Date of Birth: 10-27-1940  Date of referral:  08/13/17               Reason for consult:  Facility Placement                Permission sought to share information with:  Chartered certified accountant granted to share information::  Yes, Verbal Permission Granted  Name::        Agency::  [facility]  Relationship::     Contact Information:     Housing/Transportation Living arrangements for the past 2 months:  [place], [place] of Information:  Patient, Medical Team, Facility Patient Interpreter Needed:  None Criminal Activity/Legal Involvement Pertinent to Current Situation/Hospitalization:  No - Comment as needed Significant Relationships:  Adult Children Lives with:  Self Do you feel safe going back to the place where you live?  Yes Need for family participation in patient care:  No (Coment)  Care giving concerns:  The patient admitted from [facility] assessment / plan:  The CSW met with the patient at bedside to discuss discharge planning. The patient stated that she is not satisfied with her care at [facility] and would like to change. The CSW has begun a bed search for possible discharge to a new venue. The patient understands that if a different venue is not available, she will discharge back to [facility] if the facility agrees. The CSW has left a message for Magda Paganini, the admissions coordinator for [facility] and is waiting for a return call to confirm that the patient can return. If the patient changes venues, she may require a new [employer] authorization. CSW is following.  Employment status:  Retired Nurse, adult PT Recommendations:  Not assessed at this time Information / Referral to community resources:  [place]  Patient/Family's  Response to care:  The patient was gracious and thanked the CSW.  Patient/Family's Understanding of and Emotional Response to Diagnosis, Current Treatment, and Prognosis:  The patient understands the discharge plan and is willing to return to [facility] but would prefer a different venue.  Emotional Assessment Appearance:  Appears stated age Attitude/Demeanor/Rapport:  Charismatic, Gracious Affect (typically observed):  Appropriate, Pleasant Orientation:  Oriented to Self, Oriented to Place, Oriented to  Time, Oriented to Situation Alcohol / Substance use:  Never Used Psych involvement (Current and /or in the community):  No (Comment)  Discharge Needs  Concerns to be addressed:  Care Coordination, Discharge Planning Concerns Readmission within the last 30 days:  Yes Current discharge risk:  Chronically ill Barriers to Discharge:  Continued Medical Work up   [employer], LCSW 08/13/2017, 2:46 PM

## 2017-08-13 NOTE — Progress Notes (Addendum)
Pharmacy Electrolyte Monitoring Consult:  Pharmacy consulted to assist in monitoring and replacing electrolytes in this 77 y.o. female admitted on 08/12/2017 with Altered Mental Status and Fever  Patient received potassium 36mq IV Q 1hr x 5 doses.   Labs:  Sodium (mmol/L)  Date Value  08/13/2017 134 (L)   Potassium (mmol/L)  Date Value  08/13/2017 4.5   Magnesium (mg/dL)  Date Value  08/03/2017 1.8   Phosphorus (mg/dL)  Date Value  05/05/2017 3.3   Calcium (mg/dL)  Date Value  08/13/2017 7.9 (L)   Albumin (g/dL)  Date Value  08/12/2017 2.7 (L)    Plan: No replacement warranted at this time. Patient tolerating oral medications, unless clinically necessary patient should receive oral replacement.   Will recheck electrolytes with am labs.   Pharmacy will continue to monitor and adjust per consult.   Shani Fitch L 08/13/2017 10:14 AM

## 2017-08-13 NOTE — Progress Notes (Signed)
[facility] [place] NAME: Regina Hood    MR#:  [id number]  DATE OF BIRTH:  07/13/1940  SUBJECTIVE:   Patient presented to the emergency room due to not feeling well  Patient reports she is feeling better this morning.  REVIEW OF SYSTEMS:    Review of Systems  Constitutional: Negative for fever, chills weight loss HENT: Negative for ear pain, nosebleeds, congestion, facial swelling, rhinorrhea, neck pain, neck stiffness and ear discharge.   Respiratory: Negative for cough, shortness of breath, wheezing  Cardiovascular: Negative for chest pain, palpitations and leg swelling.  Gastrointestinal: Negative for heartburn, abdominal pain, vomiting, diarrhea or consitpation Genitourinary: Negative for dysuria, urgency, frequency, hematuria Musculoskeletal: Negative for back pain or joint pain Neurological: Negative for dizziness, seizures, syncope, focal weakness,  numbness and headaches.  Hematological: Does not bruise/bleed easily.  Psychiatric/Behavioral: Negative for hallucinations, confusion, dysphoric mood    Tolerating Diet: yes      DRUG ALLERGIES:   Allergies  Allergen Reactions  . Penicillins Nausea Only    VITALS:  Blood pressure 140/76, pulse 93, temperature 97.6 F (36.4 C), temperature source Oral, resp. rate 18, height 5' 2"  (1.575 m), weight 73.2 kg (161 lb 6 oz), SpO2 100 %.  PHYSICAL EXAMINATION:  Constitutional: Appears well-developed and well-nourished. No distress. HENT: Normocephalic. Marland Kitchen Oropharynx is clear and moist.  Eyes: Conjunctivae and EOM are normal. PERRLA, no scleral icterus.  Neck: Normal ROM. Neck supple. No JVD. No tracheal deviation. CVS: RRR, S1/S2 +, no murmurs, no gallops, no carotid bruit.  Pulmonary: Effort and breath sounds normal, no stridor, rhonchi, wheezes, rales.  Abdominal: Soft. BS +, nondistended abdomen with 2 biliary drains present Biliary drain is clean and intact Musculoskeletal:  Normal range of motion. No edema and no tenderness.  Neuro: Alert. CN 2-12 grossly intact. No focal deficits. Skin: Skin is warm and dry. No rash noted. Psychiatric: Normal mood and affect.      LABORATORY PANEL:   CBC Recent Labs  Lab 08/13/17 0607  WBC 11.6*  HGB 8.5*  HCT 26.2*  PLT 170   ------------------------------------------------------------------------------------------------------------------  Chemistries  Recent Labs  Lab 08/12/17 1904 08/13/17 0607  NA 135 134*  K 3.3* 4.5  CL 99* 101  CO2 26 27  GLUCOSE 76 196*  BUN 15 16  CREATININE 0.62 0.61  CALCIUM 8.8* 7.9*  AST 55*  --   ALT 23  --   ALKPHOS 329*  --   BILITOT 0.8  --    ------------------------------------------------------------------------------------------------------------------  Cardiac Enzymes Recent Labs  Lab 08/07/17 1020 08/12/17 1904 08/13/17 0607  TROPONINI 0.04* 0.05* 0.03*   ------------------------------------------------------------------------------------------------------------------  RADIOLOGY:  Dg Chest Portable 1 View  Result Date: 08/12/2017 CLINICAL DATA:  Shortness of breath. Altered mental status. Fever. Patient was seen here 2 days ago for respiratory failure and sepsis. EXAM: PORTABLE CHEST 1 VIEW COMPARISON:  08/12/2017 FINDINGS: Cardiac enlargement with pulmonary vascular congestion. Increasing lung infiltrates likely representing increasing edema. Superimposed pneumonia may also be present. Small bilateral pleural effusions. No pneumothorax. Power port type right central venous catheter with tip over the cavoatrial junction region. Surgical clips in the right lung. Postoperative changes in the left shoulder. IMPRESSION: Cardiac enlargement with pulmonary vascular congestion. Increasing lung infiltrates bilaterally likely represent increasing edema. Superimposed pneumonia is also possible. Electronically Signed   By: Lucienne Capers M.D.   On: 08/12/2017 21:18    Dg Chest Port 1 View  Result Date: 08/12/2017 CLINICAL DATA:  Altered  mental status and fever EXAM: PORTABLE CHEST 1 VIEW COMPARISON:  Chest radiograph 08/07/2017 FINDINGS: Power injectable right chest wall Port-A-Cath follows and internal jugular vein approach with tip in the lower SVC. There is cardiomegaly with enlarged pulmonary arteries. Bibasilar predominant interstitial opacities are unchanged. No pneumothorax or sizable pleural effusion. Postsurgical changes in the lower right lung. IMPRESSION: Unchanged appearance of the chest compared to 08/07/2017 radiograph with persistent cardiomegaly and enlarged pulmonary arteries. Bibasilar interstitial opacities are also unchanged. Electronically Signed   By: Ulyses Jarred M.D.   On: 08/12/2017 19:12     ASSESSMENT AND PLAN:  77 year old female with cholangiocarcinoma status post biliary drain, interstitial lung disease and chronic hypoxic respiratory failure on 2 L of oxygen who presents from [facility] due to altered mental status and fever.  1.  Acute metabolic encephalopathy in the setting of sepsis due to HCAP Patient appears to be at baseline  2.  Sepsis due to HCAP with chronic hypoxic respiratory failure on 2 to 3 L of oxygen: Patient presented with tachycardia, tachypnea and fever of 100.1 This is due to HCAP Lactic acid improved  3.  HCAP: Continue cefepime.  MRSA PCR is negative  4.  Elevated troponin: Patient has ruled out for ACS.  5.  Hyponatremia: This is mild and due to pneumonia patient does have some component of mild chronic hyponatremia as well  6.  Hypokalemia: This is been repleted  7.  Anemia of chronic disease: Monitor hemoglobin  8.  Cholangiocarcinoma status post biliary drains: Patient was recently discharged with Flagyl and Cipro as recommended by ID consultant.  Continue Flagyl and cefepime  9.  History of sarcoidosis: Continue Plaquenil  10.  Diabetes: Continue sliding scale and ADA diet  11.   SLE: Patient will continue on Imuran 12.  Essential hypertension: Continue metoprolol   Physical therapy and clinical social consultation for discharge planning   Management plans discussed with the patient and she is in agreement.  CODE STATUS: DNR  TOTAL TIME TAKING CARE OF THIS PATIENT: 29 minutes.     POSSIBLE D/C tomorrow, DEPENDING ON CLINICAL CONDITION.   Jasey Cortez M.D on 08/13/2017 at 11:14 AM  Between 7am to 6pm - Pager - [phone] After 6pm go to [email or web address] - password [email or web address] [facility]  Office  [phone]  CC: Primary care physician; Idelle Crouch, MD  Note: This dictation was prepared with Dragon dictation along with smaller phrase technology. Any transcriptional errors that result from this process are unintentional.

## 2017-08-13 NOTE — Progress Notes (Addendum)
Pharmacy Antibiotic Note  Regina Hood is a 77 y.o. female admitted on 08/12/2017 with pneumonia. Patient with recent hospital admission. Pharmacy has been consulted for cefepime dosing. Vancomycin discontinued secondary to negative MRSA PCR. Metronidazole added to regimen due to history of cholangiocarcinoma with biliary drain in place.    Plan: Cefepime 1g IV Q12hr.    Height: 5' 2"  (157.5 cm) Weight: 161 lb 6 oz (73.2 kg) IBW/kg (Calculated) : 50.1  Temp (24hrs), Avg:98.3 F (36.8 C), Min:97.5 F (36.4 C), Max:100.1 F (37.8 C)  Recent Labs  Lab 08/06/17 2329  08/07/17 0405 08/07/17 1128 08/08/17 0456 08/09/17 0435 08/10/17 0517 08/12/17 1904 08/12/17 2138 08/13/17 0312 08/13/17 0607  WBC 13.1*  --  11.6*  --  9.6  --   --  10.3  --   --  11.6*  CREATININE 0.79  --  0.83  --  0.57 0.63 0.63 0.62  --   --  0.61  LATICACIDVEN 5.3*   < >  --  0.7  --   --   --  3.8* 1.7 1.3 1.1   < > = values in this interval not displayed.    Estimated Creatinine Clearance: 56 mL/min (by C-G formula based on SCr of 0.61 mg/dL).    Allergies  Allergen Reactions  . Penicillins Nausea Only    Antimicrobials this admission: Vancomycin 4/26 >> 4/27 Cefepime 4/26 >>  Metronidazole 4/26 >>   Dose adjustments this admission: N/A  Microbiology results: 4/26 BCx: no growth < 12 hours  4/26 MRSA PCR: negative   Thank you for allowing pharmacy to be a part of this patient's care.  Admir Candelas L 08/13/2017 11:16 AM

## 2017-08-13 NOTE — Evaluation (Signed)
Physical Therapy Evaluation Patient Details Name: Regina Hood MRN: [id number] DOB: 06/03/1940 Today's Date: 08/13/2017   History of Present Illness  77 yo female with onset of pulm edema and PNA with metabolic encephalopathy was admitted yesterday.  Has flat troponins, cleared for MI, noted cardiac enlargement and has malnutrition. PMHx:  Cholangiocarcinoma with biliary drains, sarcoidosis, SLE, DM,  COPD exacerbation, colitis, rectal bleeding,   Clinical Impression  Pt was seen for evaluation of mobility with a considerable difficulty with SOB during gait.  Despite her O2 sats being stable with gait, she became SOB enough to sit down and rest, then in her room was very tired.  Will follow acutely for strengthening and balance training to translate into shorter rehab stay and to reduce fall risk.    Follow Up Recommendations SNF    Equipment Recommendations  None recommended by PT    Recommendations for Other Services       Precautions / Restrictions Precautions Precautions: Fall Restrictions Weight Bearing Restrictions: No      Mobility  Bed Mobility Overal bed mobility: Needs Assistance Bed Mobility: Supine to Sit;Sit to Supine     Supine to sit: Min assist Sit to supine: Min assist   General bed mobility comments: using bed rails and progressing slowly  Transfers Overall transfer level: Needs assistance Equipment used: Rolling walker (2 wheeled) Transfers: Sit to/from Stand Sit to Stand: Min guard;Min assist         General transfer comment: mainly assisting to cue safety with her bed mob  Ambulation/Gait Ambulation/Gait assistance: Min guard;Min assist Ambulation Distance (Feet): 150 Feet(120+30 with sitting rest break) Assistive device: Rolling walker (2 wheeled) Gait Pattern/deviations: Step-through pattern;Step-to pattern;Wide base of support;Trunk flexed Gait velocity: reduced Gait velocity interpretation: <1.8 ft/sec, indicate of risk for recurrent  falls General Gait Details: Pt is walking on hallway with wide turns and slow pace to correct walker, tends to walk outside it  Stairs            Wheelchair Mobility    Modified Rankin (Stroke Patients Only)       Balance Overall balance assessment: Needs assistance Sitting-balance support: Feet supported Sitting balance-Leahy Scale: Fair     Standing balance support: Bilateral upper extremity supported;During functional activity Standing balance-Leahy Scale: Poor                               Pertinent Vitals/Pain Pain Assessment: No/denies pain    Home Living Family/patient expects to be discharged to:: Skilled nursing facility Living Arrangements: Other (Comment) Available Help at Discharge: [place] Type of Home: House Home Access: Stairs to enter Entrance Stairs-Rails: Right;Left;Can reach both Entrance Stairs-Number of Steps: 4 Home Layout: One level Home Equipment: None      Prior Function Level of Independence: Independent         Comments: no AD previously and had no falls prior to just before admission     Hand Dominance   Dominant Hand: Right    Extremity/Trunk Assessment   Upper Extremity Assessment Upper Extremity Assessment: Overall WFL for tasks assessed    Lower Extremity Assessment Lower Extremity Assessment: Generalized weakness    Cervical / Trunk Assessment Cervical / Trunk Assessment: Normal  Communication   Communication: No difficulties  Cognition Arousal/Alertness: Awake/alert Behavior During Therapy: WFL for tasks assessed/performed Overall Cognitive Status: Within Functional Limits for tasks assessed  General Comments      Exercises     Assessment/Plan    PT Assessment Patient needs continued PT services  PT Problem List Decreased strength;Decreased range of motion;Decreased activity tolerance;Decreased balance;Decreased  mobility;Decreased coordination;Decreased knowledge of use of DME;Decreased safety awareness;Cardiopulmonary status limiting activity;Decreased skin integrity;Obesity       PT Treatment Interventions DME instruction;Gait training;Functional mobility training;Stair training;Therapeutic activities;Therapeutic exercise;Balance training;Neuromuscular re-education;Patient/family education    PT Goals (Current goals can be found in the Care Plan section)  Acute Rehab PT Goals Patient Stated Goal: go home and finish rehab PT Goal Formulation: With patient Time For Goal Achievement: 08/27/17 Potential to Achieve Goals: Good    Frequency Min 2X/week   Barriers to discharge Inaccessible home environment;Decreased caregiver support will be appropriate to return to SNF where she recently went    Co-evaluation               AM-PAC PT "6 Clicks" Daily Activity  Outcome Measure Difficulty turning over in bed (including adjusting bedclothes, sheets and blankets)?: Unable Difficulty moving from lying on back to sitting on the side of the bed? : Unable Difficulty sitting down on and standing up from a chair with arms (e.g., wheelchair, bedside commode, etc,.)?: Unable Help needed moving to and from a bed to chair (including a wheelchair)?: A Little Help needed walking in hospital room?: A Little Help needed climbing 3-5 steps with a railing? : A Lot 6 Click Score: 11    End of Session Equipment Utilized During Treatment: Gait belt;Oxygen Activity Tolerance: Patient limited by fatigue;Treatment limited secondary to medical complications (Comment) Patient left: with call bell/phone within reach;in bed;with bed alarm set Nurse Communication: Mobility status PT Visit Diagnosis: Unsteadiness on feet (R26.81);History of falling (Z91.81);Muscle weakness (generalized) (M62.81);Adult, failure to thrive (R62.7)    Time: 1359-1420 PT Time Calculation (min) (ACUTE ONLY): 21 min   Charges:   PT  Evaluation $PT Eval Moderate Complexity: 1 Mod     PT G Codes:   PT G-Codes **NOT FOR INPATIENT CLASS** Functional Assessment Tool Used: AM-PAC 6 Clicks Basic Mobility    Ramond Dial 08/13/2017, 4:33 PM   Mee Hives, PT MS Acute Rehab Dept. Number: [place] and [place]

## 2017-08-13 NOTE — NC FL2 (Signed)
[place] LEVEL OF CARE SCREENING TOOL     IDENTIFICATION  Patient Name: Regina Hood Birthdate: 12-02-1940 Sex: female Admission Date (Current Location): 08/12/2017  [place] and [place] Number:  Engineering geologist and Address:  [facility], [address], [place], [place] [address]      Provider Number: [id number]  Attending Physician Name and Address:  Bettey Costa, MD  Relative Name and Phone Number:  Persis Graffius (son) [phone]    Current Level of Care: Hospital Recommended Level of Care: [place] Prior Approval Number:    Date Approved/Denied:   PASRR Number: [id number]  Discharge Plan: SNF    Current Diagnoses: Patient Active Problem List   Diagnosis Date Noted  . Malnutrition of moderate degree 06/09/2017  . Generalized abdominal pain   . Hyperbilirubinemia   . Hypomagnesemia   . Hypokalemia 04/27/2017  . Proteinuria 03/31/2017  . COPD exacerbation ([place]) 03/16/2017  . Dehydration 01/19/2017  . History of iron deficiency anemia 12/29/2016  . Systemic lupus erythematosus ([place]) 12/29/2016  . Cholangiocarcinoma ([place]) 12/29/2016  . History of ulcerative colitis 12/29/2016  . Goals of care, counseling/discussion 12/29/2016  . Sepsis ([place]) 11/13/2016  . At risk for sepsis 09/29/2016  . Red blood cell antibody positive, compatible PRBC difficult to obtain 09/22/2016  . Type 2 diabetes mellitus without complication, without long-term current use of insulin ([place]) 09/17/2016  . Liver mass, left lobe 08/23/2016  . Elevated liver enzymes 08/19/2016  . Pruritic rash 08/19/2016  . IDA (iron deficiency anemia) 07/18/2016  . Cellulitis of right upper extremity 07/05/2016  . History of rectal bleeding 07/05/2016  . Rectal bleeding 06/29/2016  . Hypertension 01/07/2016  . Anxiety and depression 09/26/2013  . DDD (degenerative disc disease) 09/26/2013  . Diabetes ([place]) 09/26/2013  . Sarcoidosis 09/26/2013  . UC  (ulcerative colitis) ([place]) 09/26/2013  . Proximal humerus fracture, left, closed, initial encounter 09/17/2011    Orientation RESPIRATION BLADDER Height & Weight     Self, Time, Situation, Place  O2(Chronic 2L at baseline) Continent Weight: 161 lb 6 oz (73.2 kg) Height:  5' 2"  (157.5 cm)  BEHAVIORAL SYMPTOMS/MOOD NEUROLOGICAL BOWEL NUTRITION STATUS      Continent Diet(Heart healthy/carb modified)  AMBULATORY STATUS COMMUNICATION OF NEEDS Skin   Extensive Assist Verbally Normal                       Personal Care Assistance Level of Assistance  Bathing, Feeding, Dressing Bathing Assistance: Limited assistance Feeding assistance: Independent Dressing Assistance: Limited assistance     Functional Limitations Info    Sight Info: Adequate Hearing Info: Adequate Speech Info: Adequate    SPECIAL CARE FACTORS FREQUENCY  PT (By licensed PT), OT (By licensed OT)     PT Frequency: 5X per week OT Frequency: 3X per week            Contractures Contractures Info: Not present    Additional Factors Info  Code Status Code Status Info: DNR Allergies Info: Penicillins           Current Medications (08/13/2017):  This is the current hospital active medication list Current Facility-Administered Medications  Medication Dose Route Frequency Provider Last Rate Last Dose  . acetaminophen (TYLENOL) tablet 650 mg  650 mg Oral Q6H PRN Amelia Jo, MD       Or  . acetaminophen (TYLENOL) suppository 650 mg  650 mg Rectal Q6H PRN Amelia Jo, MD      .  azaTHIOprine (IMURAN) tablet 50 mg  50 mg Oral Daily Amelia Jo, MD   50 mg at 08/13/17 0853  . bisacodyl (DULCOLAX) EC tablet 5 mg  5 mg Oral Daily PRN Amelia Jo, MD      . ceFEPIme (MAXIPIME) 1 g in sodium chloride 0.9 % 100 mL IVPB  1 g Intravenous Q12H Amelia Jo, MD   Stopped at 08/13/17 [phone]  . docusate sodium (COLACE) capsule 100 mg  100 mg Oral BID Amelia Jo, MD   100 mg at 08/12/17 2336  . heparin injection  5,000 Units  5,000 Units Subcutaneous Q8H Amelia Jo, MD   5,000 Units at 08/13/17 1449  . HYDROcodone-acetaminophen (NORCO/VICODIN) 5-325 MG per tablet 1-2 tablet  1-2 tablet Oral Q4H PRN Amelia Jo, MD   1 tablet at 08/13/17 1125  . hydroxychloroquine (PLAQUENIL) tablet 200 mg  200 mg Oral BID Amelia Jo, MD   200 mg at 08/13/17 0853  . insulin aspart (novoLOG) injection 0-5 Units  0-5 Units Subcutaneous QHS Amelia Jo, MD      . insulin aspart (novoLOG) injection 0-9 Units  0-9 Units Subcutaneous TID WC Amelia Jo, MD   3 Units at 08/13/17 1227  . loperamide (IMODIUM) capsule 2 mg  2 mg Oral PRN Bettey Costa, MD   2 mg at 08/13/17 1125  . metoprolol tartrate (LOPRESSOR) tablet 75 mg  75 mg Oral BID Amelia Jo, MD   75 mg at 08/13/17 0846  . metroNIDAZOLE (FLAGYL) IVPB 500 mg  500 mg Intravenous Q8H Amelia Jo, MD   Stopped at 08/13/17 [phone]  . ondansetron (ZOFRAN) tablet 4 mg  4 mg Oral Q6H PRN Amelia Jo, MD       Or  . ondansetron [facility]) injection 4 mg  4 mg Intravenous Q6H PRN Amelia Jo, MD      . pantoprazole (PROTONIX) EC tablet 40 mg  40 mg Oral Daily Amelia Jo, MD   40 mg at 08/13/17 0845  . traZODone (DESYREL) tablet 25 mg  25 mg Oral QHS PRN Amelia Jo, MD   25 mg at 08/12/17 2335  . venlafaxine XR (EFFEXOR-XR) 24 hr capsule 75 mg  75 mg Oral Daily Amelia Jo, MD   75 mg at 08/13/17 0845  . vitamin B-12 (CYANOCOBALAMIN) tablet 1,000 mcg  1,000 mcg Oral Daily Amelia Jo, MD   1,000 mcg at 08/13/17 7544   Facility-Administered Medications Ordered in Other Encounters  Medication Dose Route Frequency Provider Last Rate Last Dose  . 0.9 %  sodium chloride infusion   Intravenous PRN Earlie Server, MD   Stopped at 04/27/17 1400  . heparin lock flush 100 unit/mL  500 Units Intravenous Once Earlie Server, MD         Discharge Medications: Please see discharge summary for a list of discharge medications.  Relevant Imaging Results:  Relevant Lab  Results:   Additional Information SS# [id number]  Zettie Pho, LCSW

## 2017-08-14 ENCOUNTER — Inpatient Hospital Stay: Payer: PPO

## 2017-08-14 LAB — GLUCOSE, CAPILLARY
GLUCOSE-CAPILLARY: 196 mg/dL — AB (ref 65–99)
GLUCOSE-CAPILLARY: 145 mg/dL — AB (ref 65–99)
Glucose-Capillary: 109 mg/dL — ABNORMAL HIGH (ref 65–99)
Glucose-Capillary: 192 mg/dL — ABNORMAL HIGH (ref 65–99)

## 2017-08-14 LAB — CBC
HCT: 27 % — ABNORMAL LOW (ref 35.0–47.0)
HEMATOCRIT: 24.9 % — AB (ref 35.0–47.0)
HEMOGLOBIN: 8.5 g/dL — AB (ref 12.0–16.0)
Hemoglobin: 8.7 g/dL — ABNORMAL LOW (ref 12.0–16.0)
MCH: 28.9 pg (ref 26.0–34.0)
MCH: 27.6 pg (ref 26.0–34.0)
MCHC: 34 g/dL (ref 32.0–36.0)
MCHC: 32.3 g/dL (ref 32.0–36.0)
MCV: 84.9 fL (ref 80.0–100.0)
MCV: 85.4 fL (ref 80.0–100.0)
PLATELETS: 206 10*3/uL (ref 150–440)
Platelets: 193 10*3/uL (ref 150–440)
RBC: 2.94 MIL/uL — ABNORMAL LOW (ref 3.80–5.20)
RBC: 3.16 MIL/uL — AB (ref 3.80–5.20)
RDW: 16.1 % — ABNORMAL HIGH (ref 11.5–14.5)
RDW: 16.2 % — AB (ref 11.5–14.5)
WBC: 9.1 10*3/uL (ref 3.6–11.0)
WBC: 11.9 10*3/uL — ABNORMAL HIGH (ref 3.6–11.0)

## 2017-08-14 LAB — COMPREHENSIVE METABOLIC PANEL
ALBUMIN: 2.6 g/dL — AB (ref 3.5–5.0)
ALT: 18 U/L (ref 14–54)
AST: 41 U/L (ref 15–41)
Alkaline Phosphatase: 274 U/L — ABNORMAL HIGH (ref 38–126)
Anion gap: 8 (ref 5–15)
BUN: 20 mg/dL (ref 6–20)
CHLORIDE: 98 mmol/L — AB (ref 101–111)
CO2: 27 mmol/L (ref 22–32)
CREATININE: 0.66 mg/dL (ref 0.44–1.00)
Calcium: 8.3 mg/dL — ABNORMAL LOW (ref 8.9–10.3)
GFR calc Af Amer: >60 mL/min (ref >60)
GFR calc non Af Amer: >60 mL/min (ref >60)
Glucose, Bld: 193 mg/dL — ABNORMAL HIGH (ref 65–99)
POTASSIUM: 3.7 mmol/L (ref 3.5–5.1)
SODIUM: 133 mmol/L — AB (ref 135–145)
Total Bilirubin: 0.7 mg/dL (ref 0.3–1.2)
Total Protein: 6.2 g/dL — ABNORMAL LOW (ref 6.5–8.1)

## 2017-08-14 LAB — BASIC METABOLIC PANEL
ANION GAP: 6 (ref 5–15)
BUN: 22 mg/dL — ABNORMAL HIGH (ref 6–20)
CHLORIDE: 101 mmol/L (ref 101–111)
CO2: 28 mmol/L (ref 22–32)
CREATININE: 0.81 mg/dL (ref 0.44–1.00)
Calcium: 8 mg/dL — ABNORMAL LOW (ref 8.9–10.3)
GFR calc non Af Amer: >60 mL/min (ref >60)
Glucose, Bld: 113 mg/dL — ABNORMAL HIGH (ref 65–99)
POTASSIUM: 3.6 mmol/L (ref 3.5–5.1)
Sodium: 135 mmol/L (ref 135–145)

## 2017-08-14 LAB — LACTIC ACID, PLASMA: LACTIC ACID, VENOUS: 2.9 mmol/L — AB (ref 0.5–1.9)

## 2017-08-14 MED ORDER — MORPHINE SULFATE (PF) 2 MG/ML IV SOLN
2.0000 mg | Freq: Once | INTRAVENOUS | Status: AC
  Administered 2017-08-14: 2 mg via INTRAVENOUS
  Filled 2017-08-14: qty 1

## 2017-08-14 MED ORDER — ENSURE ENLIVE PO LIQD
237.0000 mL | Freq: Two times a day (BID) | ORAL | Status: DC
  Administered 2017-08-14 – 2017-08-18 (×4): 237 mL via ORAL

## 2017-08-14 MED ORDER — SODIUM CHLORIDE 0.9% FLUSH
10.0000 mL | Freq: Every day | INTRAVENOUS | Status: DC
  Administered 2017-08-14 – 2017-08-18 (×5): 10 mL

## 2017-08-14 MED ORDER — LORAZEPAM 2 MG/ML IJ SOLN
INTRAMUSCULAR | Status: AC
  Filled 2017-08-14: qty 1

## 2017-08-14 MED ORDER — LORAZEPAM 2 MG/ML IJ SOLN
0.5000 mg | Freq: Every evening | INTRAMUSCULAR | Status: DC | PRN
  Administered 2017-08-14 – 2017-08-15 (×3): 0.5 mg via INTRAVENOUS
  Filled 2017-08-14 (×2): qty 1

## 2017-08-14 MED ORDER — IOPAMIDOL (ISOVUE-300) INJECTION 61%
15.0000 mL | INTRAVENOUS | Status: AC
  Administered 2017-08-14 – 2017-08-15 (×2): 15 mL via ORAL

## 2017-08-14 MED ORDER — SODIUM CHLORIDE 0.9 % IV SOLN
INTRAVENOUS | Status: DC
  Administered 2017-08-14 – 2017-08-18 (×3): via INTRAVENOUS

## 2017-08-14 MED ORDER — FUROSEMIDE 10 MG/ML IJ SOLN
20.0000 mg | Freq: Three times a day (TID) | INTRAMUSCULAR | Status: DC
  Administered 2017-08-14 – 2017-08-18 (×9): 20 mg via INTRAVENOUS
  Filled 2017-08-14 (×11): qty 2

## 2017-08-14 MED ORDER — FUROSEMIDE 10 MG/ML IJ SOLN
20.0000 mg | Freq: Once | INTRAMUSCULAR | Status: AC
  Administered 2017-08-14: 08:00:00 20 mg via INTRAVENOUS
  Filled 2017-08-14: qty 2

## 2017-08-14 NOTE — Progress Notes (Signed)
Initial Nutrition Assessment  DOCUMENTATION CODES:   Non-severe (moderate) malnutrition in context of chronic illness  INTERVENTION:  Provide Ensure Enlive po BID, each supplement provides 350 kcal and 20 grams of protein.  Encouraged adequate intake of calories and protein at meals. Discussed protein options available on menu.  NUTRITION DIAGNOSIS:   Moderate Malnutrition related to chronic illness(stage IV cholangiocarcinoma, COPD, lupus) as evidenced by mild fat depletion, mild muscle depletion, moderate muscle depletion.  GOAL:   Patient will meet greater than or equal to 90% of their needs  MONITOR:   PO intake, Supplement acceptance, Labs, Weight trends, I & O's  REASON FOR ASSESSMENT:   Malnutrition Screening Tool    ASSESSMENT:   77 year old female with PMHx of diverticulitis, GI bleed, HTN, GERD, arthritis, COPD, DM, depression, UC, lupus, cholangiocarcinoma s/p biliary drain who is admitted with PNA.   Met with patient at bedside. She is known to this RD from a recent admission and was also seen by Dietetic Intern last week. Patient reports her appetite is "okay." She is eating 1-2 meals per day. She is unable to report what type of foods she is eating or how much at a time. She was just starting on her lunch tray at time of RD assessment. Patient had mashed potatoes with gravy, broccoli, and gelatin. RD encouraged a good source of protein at meals and discussed options available. Patient is amenable to drinking Ensure.  Weight remains fairly stable at this time. UBW around 160 lbs. Patient had lost weight a few months ago but has been slowly gaining weight back.  Meal Completion: 80-100%  Medications reviewed and include: Colace, Novolog 0-5 units QHS, Novolog 0-9 units TID, pantoprazole, vitamin B12 1000 micrograms daily, cefepime, Flagyl.  Labs reviewed: CBG 109-207, BUN 22.  NUTRITION - FOCUSED PHYSICAL EXAM:    Most Recent Value  Orbital Region  Mild  depletion  Upper Arm Region  Moderate depletion  Thoracic and Lumbar Region  Mild depletion  Buccal Region  Mild depletion  Temple Region  Mild depletion  Clavicle Bone Region  No depletion  Clavicle and Acromion Bone Region  No depletion  Scapular Bone Region  No depletion  Dorsal Hand  Mild depletion  Patellar Region  Mild depletion  Anterior Thigh Region  Moderate depletion  Posterior Calf Region  Moderate depletion  Edema (RD Assessment)  Mild  Hair  Reviewed  Eyes  Reviewed  Mouth  Reviewed  Skin  Reviewed  Nails  Reviewed     Diet Order:  Diet heart healthy/carb modified Room service appropriate? Yes; Fluid consistency: Thin  EDUCATION NEEDS:   Education needs have been addressed  Skin:  Skin Assessment: Reviewed RN Assessment  Last BM:  08/13/2017 - small type 6  Height:   Ht Readings from Last 1 Encounters:  08/12/17 _0  (1.575 m)    Weight:   Wt Readings from Last 1 Encounters:  08/14/17 158 lb 1.1 oz (71.7 kg)    Ideal Body Weight:  50 kg  BMI:  Body mass index is 28.91 kg/m.  Estimated Nutritional Needs:   Kcal:  1515-1750 (MSJ x 1.3-1.5)  Protein:  85-105 grams (1.2-1.5 grams/kg)  Fluid:  1.8 L/day (25 mL/kg)  Willey Blade, MS, RD, LDN Office: [phone] Pager: [phone] After Hours/Weekend Pager: [phone]

## 2017-08-14 NOTE — Progress Notes (Signed)
[place] [place] NAME: Regina Hood    MR#:  [id number]  DATE OF BIRTH:  1940/11/10  SUBJECTIVE:   Patient doing well this morning.  No shortness of breath, cough or fevers.  She is tolerating her diet well. REVIEW OF SYSTEMS:    Review of Systems  Constitutional: Negative for fever, chills weight loss HENT: Negative for ear pain, nosebleeds, congestion, facial swelling, rhinorrhea, neck pain, neck stiffness and ear discharge.   Respiratory: Negative for cough, shortness of breath, wheezing  Cardiovascular: Negative for chest pain, palpitations and leg swelling.  Gastrointestinal: Negative for heartburn, abdominal pain, vomiting, diarrhea or consitpation Genitourinary: Negative for dysuria, urgency, frequency, hematuria Musculoskeletal: Negative for back pain or joint pain Neurological: Negative for dizziness, seizures, syncope, focal weakness,  numbness and headaches.  Hematological: Does not bruise/bleed easily.  Psychiatric/Behavioral: Negative for hallucinations, confusion, dysphoric mood    Tolerating Diet: yes      DRUG ALLERGIES:   Allergies  Allergen Reactions  . Penicillins Nausea Only    VITALS:  Blood pressure 118/68, pulse 87, temperature 98.6 F (37 C), resp. rate 18, height 5' 2"  (1.575 m), weight 71.7 kg (158 lb 1.1 oz), SpO2 100 %.  PHYSICAL EXAMINATION:  Constitutional: Appears well-developed and well-nourished. No distress. HENT: Normocephalic. Marland Kitchen Oropharynx is clear and moist.  Eyes: Conjunctivae and EOM are normal. PERRLA, no scleral icterus.  Neck: Normal ROM. Neck supple. No JVD. No tracheal deviation. CVS: RRR, S1/S2 +, no murmurs, no gallops, no carotid bruit.  Pulmonary: Effort and breath sounds normal, no stridor, rhonchi, wheezes, rales.  Abdominal: Soft. BS +, nondistended abdomen with 2 biliary drains present Biliary drain is clean and intact Musculoskeletal: Normal range of motion. No edema and  no tenderness.  Neuro: Alert. CN 2-12 grossly intact. No focal deficits. Skin: Skin is warm and dry. No rash noted. Psychiatric: Normal mood and affect.      LABORATORY PANEL:   CBC Recent Labs  Lab 08/14/17 0702  WBC 9.1  HGB 8.5*  HCT 24.9*  PLT 193   ------------------------------------------------------------------------------------------------------------------  Chemistries  Recent Labs  Lab 08/12/17 1904  08/14/17 0702  NA 135   < > 135  K 3.3*   < > 3.6  CL 99*   < > 101  CO2 26   < > 28  GLUCOSE 76   < > 113*  BUN 15   < > 22*  CREATININE 0.62   < > 0.81  CALCIUM 8.8*   < > 8.0*  AST 55*  --   --   ALT 23  --   --   ALKPHOS 329*  --   --   BILITOT 0.8  --   --    < > = values in this interval not displayed.   ------------------------------------------------------------------------------------------------------------------  Cardiac Enzymes Recent Labs  Lab 08/12/17 1904 08/13/17 0607  TROPONINI 0.05* 0.03*   ------------------------------------------------------------------------------------------------------------------  RADIOLOGY:  Dg Chest Portable 1 View  Result Date: 08/12/2017 CLINICAL DATA:  Shortness of breath. Altered mental status. Fever. Patient was seen here 2 days ago for respiratory failure and sepsis. EXAM: PORTABLE CHEST 1 VIEW COMPARISON:  08/12/2017 FINDINGS: Cardiac enlargement with pulmonary vascular congestion. Increasing lung infiltrates likely representing increasing edema. Superimposed pneumonia may also be present. Small bilateral pleural effusions. No pneumothorax. Power port type right central venous catheter with tip over the cavoatrial junction region. Surgical clips in the right lung. Postoperative changes in the left shoulder. IMPRESSION:  Cardiac enlargement with pulmonary vascular congestion. Increasing lung infiltrates bilaterally likely represent increasing edema. Superimposed pneumonia is also possible. Electronically  Signed   By: Lucienne Capers M.D.   On: 08/12/2017 21:18   Dg Chest Port 1 View  Result Date: 08/12/2017 CLINICAL DATA:  Altered mental status and fever EXAM: PORTABLE CHEST 1 VIEW COMPARISON:  Chest radiograph 08/07/2017 FINDINGS: Power injectable right chest wall Port-A-Cath follows and internal jugular vein approach with tip in the lower SVC. There is cardiomegaly with enlarged pulmonary arteries. Bibasilar predominant interstitial opacities are unchanged. No pneumothorax or sizable pleural effusion. Postsurgical changes in the lower right lung. IMPRESSION: Unchanged appearance of the chest compared to 08/07/2017 radiograph with persistent cardiomegaly and enlarged pulmonary arteries. Bibasilar interstitial opacities are also unchanged. Electronically Signed   By: Ulyses Jarred M.D.   On: 08/12/2017 19:12     ASSESSMENT AND PLAN:  77 year old female with cholangiocarcinoma status post biliary drain, interstitial lung disease and chronic hypoxic respiratory failure on 2 L of oxygen who presents from [facility] due to altered mental status and fever.  1.  Acute metabolic encephalopathy in the setting of sepsis due to HCAP Patient is at her baseline.  2.  Sepsis due to HCAP with chronic hypoxic respiratory failure on 2 liters of oxygen: Patient presented with tachycardia, tachypnea and fever of 100.1 This is due to HCAP Lactic acid improved Abscess has resolved.  3.  HCAP: Continue cefepime.  MRSA PCR is negative Change to oral antibiotics tomorrow for discharge. 4.  Elevated troponin: Patient has ruled out for ACS.  5.  Hyponatremia: This is mild and due to pneumonia with history of chronic hyponatremia.    6.  Hypokalemia: This has been repleted. 7.  Anemia of chronic disease: Monitor hemoglobin  8.  Cholangiocarcinoma status post biliary drains: Patient was recently discharged with Flagyl and Cipro as recommended by ID consultant.  Continue Flagyl and cefepime.   9.  History  of sarcoidosis: Continue Plaquenil  10.  Diabetes: Continue sliding scale and ADA diet  11.  SLE: Patient will continue on Imuran 12.  Essential hypertension: Continue metoprolol   He will be discharged tomorrow to [facility].   Management plans discussed with the patient and she is in agreement.  CODE STATUS: DNR  TOTAL TIME TAKING CARE OF THIS PATIENT: 24 minutes.     POSSIBLE D/C tomorrow, DEPENDING ON CLINICAL CONDITION.   Elim Peale M.D on 08/14/2017 at 11:34 AM  Between 7am to 6pm - Pager - [phone] After 6pm go to [email or web address] - password [email or web address] [facility]  Office  [phone]  CC: Primary care physician; Idelle Crouch, MD  Note: This dictation was prepared with Dragon dictation along with smaller phrase technology. Any transcriptional errors that result from this process are unintentional.

## 2017-08-14 NOTE — Progress Notes (Signed)
I was called by nurse earlier because of patient being septic. She had tachycardia, tachypnea, hypoxia, fever.  I have reviewed patient's chart, she was recently admitted and discharged 4 days ago with drainage due to for cholangiocarcinoma, no active source of infection was found other than healthcare associated pneumonia. She was seen by infectious disease physician in last admission and was advised to finish oral Flagyl and Cipro.  Patient was found negative for MRSA and so taken off from vancomycin, her blood cultures are negative in this admission and past admission.  Today patient is septic again, I had requested stat labs and review them later on. Also ordered and reviewed chest x-ray.  Physical exam  Patient was alert and oriented but appearing in acute distress.  RS: fast breathing, using accessory muscles of respiration, bilateral crackling sound, no active wheezing. CVS: tachycardia, no murmurs. Abd:  Drainage tube present in upper right abdomen, slightly tender.  Labs  reviewed, patient has worsening in white blood cell count and lactic acid level is higher. Chest x-ray is not reported yet, but I reviewed and compared it to the previous one and it shows some worsening pulmonary edema.  Assessment and plan  Sepsis Acute hypoxic respiratory failure Healthcare associated pneumonia Pulmonary edema, likely acute diastolic congestive heart failure Cholangiocarcinoma with biliary drainage tubes  Patient is on cefepime plus Flagyl, which is sufficient to cover intra-abdominal infection. Her MRSA screening is negative. I have requested repeat blood culture.lactic acid and white blood cell count is worsening. Initially I had ordered some IV fluids but after my exam when she was found with crackling bilateral lungs, I have stopped IV fluid and requested Lasix injection. As there is no clear source of infection, I would order a CT scan abdomen and chest with contrast. Infectious  disease consult. Also requested echocardiogram to check on cardiac function.  As she has cholangiocarcinoma and now sepsis with unknown etiology- with repeated admission to hospital, she has very poor prognosis and high risk for worsening her condition. I would call palliative care consult for further management.  Critical care time spent 40 minutes.

## 2017-08-14 NOTE — Progress Notes (Signed)
Nurse called , pt is septic now with fevr, tachycardia, tachypnea.  She is on Abx.  Will order CBC, bl cx, lactic acid and Xray chest stat.

## 2017-08-14 NOTE — Clinical Social Work Note (Signed)
The CSW met with the patient at bedside to advise that no other SNFs are able to extend bed offers at this time; however, she can return to [facility]. The patient was disappointed, but she is willing to return. The CSW has spoken with the admissions coordinator, Magda Paganini, who has confirmed that the patient can return tomorrow pending a new [employer] authorization. The CSW has contacted the insurance provider to begin the authorization process. The CSW will continue to follow for discharge facilitation.  Santiago Bumpers, MSW, [facility] [phone]

## 2017-08-14 NOTE — Progress Notes (Signed)
Pharmacy Electrolyte Monitoring Consult:  Pharmacy consulted to assist in monitoring and replacing electrolytes in this 77 y.o. female admitted on 08/12/2017 with Altered Mental Status and Fever  Labs:  Sodium (mmol/L)  Date Value  08/14/2017 135   Potassium (mmol/L)  Date Value  08/14/2017 3.6   Magnesium (mg/dL)  Date Value  08/03/2017 1.8   Phosphorus (mg/dL)  Date Value  05/05/2017 3.3   Calcium (mg/dL)  Date Value  08/14/2017 8.0 (L)   Albumin (g/dL)  Date Value  08/12/2017 2.7 (L)    Plan: No replacement warranted at this time. Patient tolerating oral medications, unless clinically necessary patient should receive oral replacement.   Will recheck electrolytes with am labs.   Pharmacy will continue to monitor and adjust per consult.   Regina Hood K 08/14/2017 8:33 AM

## 2017-08-15 ENCOUNTER — Inpatient Hospital Stay: Payer: PPO

## 2017-08-15 ENCOUNTER — Inpatient Hospital Stay
Admit: 2017-08-15 | Discharge: 2017-08-15 | Disposition: A | Discharge status: Home / Self Care | Payer: PPO | Attending: Internal Medicine | Admitting: Internal Medicine

## 2017-08-15 ENCOUNTER — Encounter: Payer: Self-pay | Admitting: Radiology

## 2017-08-15 DIAGNOSIS — J441 Chronic obstructive pulmonary disease with (acute) exacerbation: Secondary | ICD-10-CM

## 2017-08-15 DIAGNOSIS — J181 Lobar pneumonia, unspecified organism: Secondary | ICD-10-CM

## 2017-08-15 DIAGNOSIS — Z66 Do not resuscitate: Secondary | ICD-10-CM

## 2017-08-15 DIAGNOSIS — Z7189 Other specified counseling: Secondary | ICD-10-CM

## 2017-08-15 DIAGNOSIS — Z515 Encounter for palliative care: Secondary | ICD-10-CM

## 2017-08-15 DIAGNOSIS — R0902 Hypoxemia: Secondary | ICD-10-CM

## 2017-08-15 LAB — BASIC METABOLIC PANEL
Anion gap: 6 (ref 5–15)
BUN: 16 mg/dL (ref 6–20)
CHLORIDE: 97 mmol/L — AB (ref 101–111)
CO2: 31 mmol/L (ref 22–32)
Calcium: 7.9 mg/dL — ABNORMAL LOW (ref 8.9–10.3)
Creatinine, Ser: 0.65 mg/dL (ref 0.44–1.00)
GFR calc Af Amer: >60 mL/min (ref >60)
GLUCOSE: 119 mg/dL — AB (ref 65–99)
POTASSIUM: 3.3 mmol/L — AB (ref 3.5–5.1)
SODIUM: 134 mmol/L — AB (ref 135–145)

## 2017-08-15 LAB — GLUCOSE, CAPILLARY
GLUCOSE-CAPILLARY: 155 mg/dL — AB (ref 65–99)
Glucose-Capillary: 107 mg/dL — ABNORMAL HIGH (ref 65–99)
Glucose-Capillary: 93 mg/dL (ref 65–99)
Glucose-Capillary: 78 mg/dL (ref 65–99)

## 2017-08-15 LAB — CBC
HCT: 24.4 % — ABNORMAL LOW (ref 35.0–47.0)
Hemoglobin: 8.3 g/dL — ABNORMAL LOW (ref 12.0–16.0)
MCH: 28.7 pg (ref 26.0–34.0)
MCHC: 34.2 g/dL (ref 32.0–36.0)
MCV: 84 fL (ref 80.0–100.0)
PLATELETS: 183 10*3/uL (ref 150–440)
RBC: 2.9 MIL/uL — ABNORMAL LOW (ref 3.80–5.20)
RDW: 15.9 % — AB (ref 11.5–14.5)
WBC: 8.4 10*3/uL (ref 3.6–11.0)

## 2017-08-15 LAB — PROCALCITONIN: Procalcitonin: 1.14 ng/mL

## 2017-08-15 LAB — LACTIC ACID, PLASMA: Lactic Acid, Venous: 1 mmol/L (ref 0.5–1.9)

## 2017-08-15 MED ORDER — SODIUM CHLORIDE 0.9% FLUSH: 10.0000 mL | INTRAVENOUS | Status: DC | PRN

## 2017-08-15 MED ORDER — IOPAMIDOL (ISOVUE-370) INJECTION 76%
75.0000 mL | Freq: Once | INTRAVENOUS | Status: AC | PRN
  Administered 2017-08-15: 75 mL via INTRAVENOUS

## 2017-08-15 MED ORDER — VENLAFAXINE HCL ER 75 MG PO CP24
75.0000 mg | ORAL_CAPSULE | Freq: Three times a day (TID) | ORAL | Status: DC
  Administered 2017-08-15 – 2017-08-18 (×8): 75 mg via ORAL
  Filled 2017-08-15 (×8): qty 1

## 2017-08-15 MED ORDER — POTASSIUM CHLORIDE CRYS ER 20 MEQ PO TBCR: 40.0000 meq | EXTENDED_RELEASE_TABLET | Freq: Once | ORAL | Status: DC

## 2017-08-15 MED ORDER — POTASSIUM CHLORIDE CRYS ER 20 MEQ PO TBCR
40.0000 meq | EXTENDED_RELEASE_TABLET | Freq: Once | ORAL | Status: AC
  Administered 2017-08-15: 09:00:00 40 meq via ORAL
  Filled 2017-08-15: qty 2

## 2017-08-15 MED ORDER — TRAMADOL HCL 50 MG PO TABS
50.0000 mg | ORAL_TABLET | Freq: Four times a day (QID) | ORAL | Status: DC | PRN
  Administered 2017-08-15 – 2017-08-17 (×4): 50 mg via ORAL
  Filled 2017-08-15 (×4): qty 1

## 2017-08-15 NOTE — Progress Notes (Addendum)
Clinical social worker (CSW) met with patient to let her know that HTA authorization has been started for SNF but it may be denied. CSW also spoke with PT who will see patient today. Patient expressed understanding and is accepting of going home with hospice if HTA Josem Kaufmann is denied. CSW contacted patient's son Elta Guadeloupe and made him aware of above.   Annamaria Boots MSW, [place] [phone]

## 2017-08-15 NOTE — Progress Notes (Signed)
CRITICAL VALUE ALERT  Critical Value:  Lactic Acid 2.9  Date & Time Notied:  08/14/2017 2120  Provider Notified: Anselm Jungling, MD  Orders Received/Actions taken: Continuous fluids discontinued, Chest xray, CT Scan of abdomen/chest with contrast ordered. IVP Lasix to be given.Continue to monitor

## 2017-08-15 NOTE — Progress Notes (Signed)
Please note, patient followed by out patent PALLIATIVE at home prior to last admission,out patient Palliative was to continue at [facility], how ever patient was readmitted to [facility] prior to being seen.  Flo Shanks RN, BSN, [facility] [place]

## 2017-08-15 NOTE — Progress Notes (Signed)
Pharmacy Electrolyte Monitoring Consult:  Pharmacy consulted to assist in monitoring and replacing electrolytes in this 77 y.o. female admitted on 08/12/2017 with Altered Mental Status and Fever  Labs:  Sodium (mmol/L)  Date Value  08/15/2017 134 (L)   Potassium (mmol/L)  Date Value  08/15/2017 3.3 (L)   Magnesium (mg/dL)  Date Value  08/03/2017 1.8   Phosphorus (mg/dL)  Date Value  05/05/2017 3.3   Calcium (mg/dL)  Date Value  08/15/2017 7.9 (L)   Albumin (g/dL)  Date Value  08/14/2017 2.6 (L)    Plan: Patient tolerating oral medications, unless clinically necessary patient should receive oral replacement.   4/29:  K 3.3.  Patient on lasix 20 mg IV q8h. Will give KCL po 40 meq x 1 Will recheck electrolytes with am labs.   Pharmacy will continue to monitor and adjust per consult.   Kathline Banbury A 08/15/2017 7:51 AM

## 2017-08-15 NOTE — Progress Notes (Signed)
Patient complaining of shortness of breath and states "I feel hot." A complete set of vitals (B/P, pulse, O2 saturation, temperature, respirations) were taken. Patient is tachypneic, tachycardiac, and febrile. MD notified and orders are forthcoming. Acetaminophen and Metoprolol administered.  Will continue to monitor.

## 2017-08-15 NOTE — Care Management Important Message (Signed)
Copy of signed IM left in patient's room.    

## 2017-08-15 NOTE — Progress Notes (Signed)
Physical Therapy Treatment Patient Details Name: Regina Hood MRN: [id number] DOB: Aug 10, 1940 Today's Date: 08/15/2017    History of Present Illness 77 yo female with onset of pulm edema and PNA with metabolic encephalopathy was admitted yesterday.  Has flat troponins, cleared for MI, noted cardiac enlargement and has malnutrition. PMHx:  Cholangiocarcinoma with biliary drains, sarcoidosis, SLE, DM,  COPD exacerbation, colitis, rectal bleeding,     PT Comments    Pt agreeable to PT; reports chronic back pain 5/10 currently. Pt does not wish to ambulate out of the room at this time, but agreeable to stand exercises. Pt participates well requiring Min guard and 1-2 UE support (requires more support with R stance; RLE weaker in stance). Post exercise pt wishes to return to sitting edge of bed (prefers, as recliner is "too uncomfortabl"). Continue PT to progress strength, endurance to improve all functional mobility.   Follow Up Recommendations  SNF     Equipment Recommendations  None recommended by PT    Recommendations for Other Services       Precautions / Restrictions Precautions Precautions: Fall Restrictions Weight Bearing Restrictions: No    Mobility  Bed Mobility               General bed mobility comments: Not tested; seated edge of bed  Transfers Overall transfer level: Needs assistance Equipment used: None Transfers: Sit to/from Stand Sit to Stand: Min guard         General transfer comment: Stands with 1 hand supported on bed rail  Ambulation/Gait             General Gait Details: Did not wish to ambulate outside of room   Stairs             Wheelchair Mobility    Modified Rankin (Stroke Patients Only)       Balance Overall balance assessment: Needs assistance Sitting-balance support: Feet supported;Bilateral upper extremity supported;No upper extremity supported Sitting balance-Leahy Scale: Good     Standing balance support:  Single extremity supported;Bilateral upper extremity supported Standing balance-Leahy Scale: Fair                              Cognition Arousal/Alertness: Awake/alert Behavior During Therapy: WFL for tasks assessed/performed;Flat affect Overall Cognitive Status: Within Functional Limits for tasks assessed                                 General Comments: Pt participates with very limited conversation/interaction. Follows all instructions well.       Exercises General Exercises - Lower Extremity Hip ABduction/ADduction: Strengthening;Both;10 reps;Standing(2 sets) Straight Leg Raises: Strengthening;Both;10 reps;Standing(2 sets) Hip Flexion/Marching: Strengthening;Both;Standing;20 reps Other Exercises Other Exercises: B hip extension 10x ea standing(2 sets)    General Comments        Pertinent Vitals/Pain Pain Assessment: 0-10 Pain Score: 5  Pain Location: back(chronic) Pain Intervention(s): Monitored during session    Home Living                      Prior Function            PT Goals (current goals can now be found in the care plan section) Progress towards PT goals: Progressing toward goals    Frequency    Min 2X/week      PT Plan Current plan remains appropriate    Co-evaluation  AM-PAC PT "6 Clicks" Daily Activity  Outcome Measure  Difficulty turning over in bed (including adjusting bedclothes, sheets and blankets)?: Unable Difficulty moving from lying on back to sitting on the side of the bed? : Unable Difficulty sitting down on and standing up from a chair with arms (e.g., wheelchair, bedside commode, etc,.)?: Unable Help needed moving to and from a bed to chair (including a wheelchair)?: A Little Help needed walking in hospital room?: A Little Help needed climbing 3-5 steps with a railing? : A Little 6 Click Score: 12    End of Session Equipment Utilized During Treatment: Oxygen Activity  Tolerance: Patient limited by pain;Other (comment)(self) Patient left: Other (comment)(seated edge of bed as found)   PT Visit Diagnosis: Unsteadiness on feet (R26.81);History of falling (Z91.81);Muscle weakness (generalized) (M62.81);Adult, failure to thrive (R62.7)     Time: 8833-7445 PT Time Calculation (min) (ACUTE ONLY): 15 min  Charges:  $Therapeutic Exercise: 8-22 mins                    G Codes:        Larae Grooms, PTA 08/15/2017, 3:54 PM

## 2017-08-15 NOTE — Progress Notes (Signed)
[facility] [place] NAME: Regina Hood    MR#:  [id number]  DATE OF BIRTH:  06/07/1940  SUBJECTIVE:   Patient had fever last night.  She was also short of breath.  She is doing better this morning.  No fevers this morning.  REVIEW OF SYSTEMS:    Review of Systems  Constitutional: Negative for fever, chills weight loss this morning HENT: Negative for ear pain, nosebleeds, congestion, facial swelling, rhinorrhea, neck pain, neck stiffness and ear discharge.   Respiratory: Negative for cough, shortness of breath, wheezing  Cardiovascular: Negative for chest pain, palpitations and leg swelling.  Gastrointestinal: Negative for heartburn, abdominal pain, vomiting, diarrhea or consitpation Genitourinary: Negative for dysuria, urgency, frequency, hematuria Musculoskeletal: Negative for back pain or joint pain Neurological: Negative for dizziness, seizures, syncope, focal weakness,  numbness and headaches.  Hematological: Does not bruise/bleed easily.  Psychiatric/Behavioral: Negative for hallucinations, confusion, dysphoric mood    Tolerating Diet: yes      DRUG ALLERGIES:   Allergies  Allergen Reactions  . Penicillins Nausea Only    VITALS:  Blood pressure 133/79, pulse 92, temperature (!) 97.5 F (36.4 C), temperature source Oral, resp. rate 19, height 5' 2"  (1.575 m), weight 74.3 kg (163 lb 12.8 oz), SpO2 100 %.  PHYSICAL EXAMINATION:  Constitutional: Appears well-developed and well-nourished. No distress. HENT: Normocephalic. Marland Kitchen Oropharynx is clear and moist.  Eyes: Conjunctivae and EOM are normal. PERRLA, no scleral icterus.  Neck: Normal ROM. Neck supple. No JVD. No tracheal deviation. CVS: RRR, S1/S2 +, no murmurs, no gallops, no carotid bruit.  Pulmonary: Effort and breath sounds normal, no stridor, rhonchi, wheezes, rales.  Abdominal: Soft. BS +, nondistended abdomen with 2 biliary drains present Biliary drain is clean and  intact Musculoskeletal: Normal range of motion. No edema and no tenderness.  Neuro: Alert. CN 2-12 grossly intact. No focal deficits. Skin: Skin is warm and dry. No rash noted. Psychiatric: Normal mood and affect.      LABORATORY PANEL:   CBC Recent Labs  Lab 08/15/17 0611  WBC 8.4  HGB 8.3*  HCT 24.4*  PLT 183   ------------------------------------------------------------------------------------------------------------------  Chemistries  Recent Labs  Lab 08/14/17 2047 08/15/17 0611  NA 133* 134*  K 3.7 3.3*  CL 98* 97*  CO2 27 31  GLUCOSE 193* 119*  BUN 20 16  CREATININE 0.66 0.65  CALCIUM 8.3* 7.9*  AST 41  --   ALT 18  --   ALKPHOS 274*  --   BILITOT 0.7  --    ------------------------------------------------------------------------------------------------------------------  Cardiac Enzymes Recent Labs  Lab 08/12/17 1904 08/13/17 0607  TROPONINI 0.05* 0.03*   ------------------------------------------------------------------------------------------------------------------  RADIOLOGY:  Dg Chest 1 View  Result Date: 08/14/2017 CLINICAL DATA:  Hypoxia EXAM: CHEST  1 VIEW COMPARISON:  08/12/2017, CT 03/16/2017 FINDINGS: Stable cardiomegaly and aortic atherosclerosis. Port catheter tip terminates in the distal SVC. Chronic coarsened interstitial lung markings and atelectasis is redemonstrated. Calcified pleural plaque at the right lung base. Chain sutures project over the right lung base as well. Superimposed interstitial edema suspected. Trace bilateral pleural effusions are present. Tiny nodular densities project over the left upper lobe and are believed to represent tiny foci pleural thickening based on prior CT. Small pulmonary nodules are not entirely excluded. Left humerus plate and screw fixation. IMPRESSION: 1. Cardiomegaly with aortic atherosclerosis. Diffuse coarsened interstitial lung markings and atelectasis with probable diffuse interstitial edema.  Trace bilateral pleural effusions. 2. Calcified pleural plaque at the  right lung base. 3. Tiny nodular densities overlying the left upper lobe, nonspecific. Pulmonary nodules versus focal pleural plaque. Electronically Signed   By: Ashley Royalty M.D.   On: 08/14/2017 22:23   Dg Chest 2 View  Result Date: 08/15/2017 CLINICAL DATA:  Chronic respiratory failure, interstitial lung disease. Productive cough, shortness of breath, wheezing EXAM: CHEST - 2 VIEW COMPARISON:  Chest x-ray 08/14/2017.  Chest CT 08/15/2017. FINDINGS: Severe chronic interstitial lung disease and fibrosis changes. Possible superimposed edema. Heart is borderline in size. Right Port-A-Cath remains in place, unchanged. Biliary drains noted in the right abdomen. IMPRESSION: Stable chronic interstitial lung disease/fibrosis. Possible superimposed edema, stable. Electronically Signed   By: Rolm Baptise M.D.   On: 08/15/2017 07:59   Ct Chest W Contrast  Result Date: 08/15/2017 CLINICAL DATA:  Sepsis. Fever. Tachycardia. Tachypnea. Cholangiocarcinoma with biliary tubes. History of ulcerative colitis, diverticulitis, sarcoidosis. EXAM: CT CHEST, ABDOMEN, AND PELVIS WITH CONTRAST TECHNIQUE: Multidetector CT imaging of the chest, abdomen and pelvis was performed following the standard protocol during bolus administration of intravenous contrast. CONTRAST:  38m ISOVUE-370 IOPAMIDOL (ISOVUE-370) INJECTION 76% COMPARISON:  03/16/2017 CT chest.  08/08/2017 CT abdomen and pelvis. FINDINGS: CT CHEST FINDINGS Cardiovascular: Normal caliber thoracic aorta with scattered calcification. Mild cardiac enlargement. Calcification in the mitral and aortic valves. No pericardial effusion. Central venous catheter tip in the SVC. Mediastinum/Nodes: Diffuse mediastinal lymphadenopathy. Largest pretracheal nodes measure about 13 mm short axis dimension. Aortopulmonic window node measures 13 mm short axis dimension. Similar to previous study. Esophagus is decompressed.  Retrocrural lymphadenopathy. Diffuse multinodular enlargement of the thyroid gland with right thyroid calcifications. Likely multinodular goiter. Lungs/Pleura: Diffuse mosaic attenuation pattern in the lungs with patchy peripheral fibrosis and coarse fibrosis in the lung bases with honeycomb change and bronchiectasis. Changes likely represent chronic fibrosis and bronchiectasis with superimposed edema. Calcified pleural plaques. No pleural effusions. No pneumothorax. Airways are patent. Musculoskeletal: Degenerative changes in the spine. Vertebral hemangiomas. No destructive bone lesions. CT ABDOMEN PELVIS FINDINGS Hepatobiliary: Internal external biliary drainage catheter is in place with left and right insertion drainage catheters. Distal pigtails in the duodenum. No bile duct dilatation. Gallbladder is contracted with gas in the gallbladder consistent with instrumentation. Postoperative partial left hepatectomy. Pancreas: Unremarkable. No pancreatic ductal dilatation or surrounding inflammatory changes. Spleen: Normal in size without focal abnormality. Adrenals/Urinary Tract: Adrenal glands are unremarkable. Kidneys are normal, without renal calculi, focal lesion, or hydronephrosis. Bladder is unremarkable. Stomach/Bowel: Stomach, small bowel, and colon are not abnormally distended. Contrast material is present in the cecum suggesting no evidence of small bowel obstruction. Scattered stool throughout the colon. Diverticulosis of the colon. No inflammatory changes. Vascular/Lymphatic: Calcification of the aorta without aneurysm. Mild prominence of retroperitoneal lymph nodes without pathologic enlargement. Reproductive: Status post hysterectomy. No adnexal masses. Other: There is a small amount of free fluid in the pericolic gutters and in the pelvis, new since prior study. No loculated fluid collections to suggest abscess. Edema in the subcutaneous fatty tissues. No free air. Musculoskeletal: Degenerative changes  in the spine. No destructive bone lesions. IMPRESSION: 1. Chronic fibrosis in the lungs with basilar honeycomb changes and bronchiectasis. 2. Superimposed edema in the lungs. 3. Mediastinal lymphadenopathy similar to previous study. 4. Internal external biliary drainage catheters remain in place. No bile duct dilatation. Pneumobilia consistent with procedures. 5. Small amount of free fluid in the pericolic gutters and in the pelvis is new since previous study. No loculated collection to suggest abscess. 6. Retrocrural and retroperitoneal lymphadenopathy is unchanged.  Electronically Signed   By: Lucienne Capers M.D.   On: 08/15/2017 05:15   Ct Abdomen Pelvis W Contrast  Result Date: 08/15/2017 CLINICAL DATA:  Sepsis. Fever. Tachycardia. Tachypnea. Cholangiocarcinoma with biliary tubes. History of ulcerative colitis, diverticulitis, sarcoidosis. EXAM: CT CHEST, ABDOMEN, AND PELVIS WITH CONTRAST TECHNIQUE: Multidetector CT imaging of the chest, abdomen and pelvis was performed following the standard protocol during bolus administration of intravenous contrast. CONTRAST:  45m ISOVUE-370 IOPAMIDOL (ISOVUE-370) INJECTION 76% COMPARISON:  03/16/2017 CT chest.  08/08/2017 CT abdomen and pelvis. FINDINGS: CT CHEST FINDINGS Cardiovascular: Normal caliber thoracic aorta with scattered calcification. Mild cardiac enlargement. Calcification in the mitral and aortic valves. No pericardial effusion. Central venous catheter tip in the SVC. Mediastinum/Nodes: Diffuse mediastinal lymphadenopathy. Largest pretracheal nodes measure about 13 mm short axis dimension. Aortopulmonic window node measures 13 mm short axis dimension. Similar to previous study. Esophagus is decompressed. Retrocrural lymphadenopathy. Diffuse multinodular enlargement of the thyroid gland with right thyroid calcifications. Likely multinodular goiter. Lungs/Pleura: Diffuse mosaic attenuation pattern in the lungs with patchy peripheral fibrosis and coarse  fibrosis in the lung bases with honeycomb change and bronchiectasis. Changes likely represent chronic fibrosis and bronchiectasis with superimposed edema. Calcified pleural plaques. No pleural effusions. No pneumothorax. Airways are patent. Musculoskeletal: Degenerative changes in the spine. Vertebral hemangiomas. No destructive bone lesions. CT ABDOMEN PELVIS FINDINGS Hepatobiliary: Internal external biliary drainage catheter is in place with left and right insertion drainage catheters. Distal pigtails in the duodenum. No bile duct dilatation. Gallbladder is contracted with gas in the gallbladder consistent with instrumentation. Postoperative partial left hepatectomy. Pancreas: Unremarkable. No pancreatic ductal dilatation or surrounding inflammatory changes. Spleen: Normal in size without focal abnormality. Adrenals/Urinary Tract: Adrenal glands are unremarkable. Kidneys are normal, without renal calculi, focal lesion, or hydronephrosis. Bladder is unremarkable. Stomach/Bowel: Stomach, small bowel, and colon are not abnormally distended. Contrast material is present in the cecum suggesting no evidence of small bowel obstruction. Scattered stool throughout the colon. Diverticulosis of the colon. No inflammatory changes. Vascular/Lymphatic: Calcification of the aorta without aneurysm. Mild prominence of retroperitoneal lymph nodes without pathologic enlargement. Reproductive: Status post hysterectomy. No adnexal masses. Other: There is a small amount of free fluid in the pericolic gutters and in the pelvis, new since prior study. No loculated fluid collections to suggest abscess. Edema in the subcutaneous fatty tissues. No free air. Musculoskeletal: Degenerative changes in the spine. No destructive bone lesions. IMPRESSION: 1. Chronic fibrosis in the lungs with basilar honeycomb changes and bronchiectasis. 2. Superimposed edema in the lungs. 3. Mediastinal lymphadenopathy similar to previous study. 4. Internal  external biliary drainage catheters remain in place. No bile duct dilatation. Pneumobilia consistent with procedures. 5. Small amount of free fluid in the pericolic gutters and in the pelvis is new since previous study. No loculated collection to suggest abscess. 6. Retrocrural and retroperitoneal lymphadenopathy is unchanged. Electronically Signed   By: WLucienne CapersM.D.   On: 08/15/2017 05:15     ASSESSMENT AND PLAN:  77year old female with cholangiocarcinoma status post biliary drain, interstitial lung disease and chronic hypoxic respiratory failure on 2 L [place]  1.  Acute metabolic encephalopathy in the setting of sepsis due to HCAP Patient is at her baseline.  2.  Sepsis due to HCAP with chronic hypoxic respiratory failure on 2 liters of oxygen: Patient presented with tachycardia, tachypnea and fever of 100.1 He had another fever last night.  CT of the abdomen  and pelvis were essentially unremarkable.  Chest x-ray also essentially unremarkable. ID consultation requested.  Case discussed with Dr. Ola Spurr. Continue cefepime and Flagyl.  3.  HCAP: Continue cefepime.  MRSA PCR is negative Await ID consultation   4.  Elevated troponin: Patient has ruled out for ACS.  5.  Hyponatremia: This is mild and due to pneumonia with history of chronic hyponatremia.    6.  Hypokalemia: This will be repleted. 7.  Anemia of chronic disease: Monitor hemoglobin  8.  Cholangiocarcinoma status post biliary drains: Patient was recently discharged with Flagyl and Cipro as recommended by ID consultant.  Continue Flagyl and cefepime.   9.  History of sarcoidosis/pulmonary fibrosis: Continue Plaquenil  10.  Diabetes: Continue sliding scale and ADA diet  11.  SLE: Patient will continue on Imuran 12.  Essential hypertension: Continue metoprolol    D/w dr fitzgerald  Management plans discussed with the patient and  she is in agreement.  CODE STATUS: DNR  TOTAL TIME TAKING CARE OF THIS PATIENT: 27 minutes.     POSSIBLE D/C tomorrow [employer], DEPENDING ON CLINICAL CONDITION.   Tyresa Prindiville M.D on 08/15/2017 at 9:08 AM  Between 7am to 6pm - Pager - [phone] After 6pm go to [email or web address] - password [email or web address] [facility]  Office  [phone]  CC: Primary care physician; Idelle Crouch, MD  Note: This dictation was prepared with Dragon dictation along with smaller phrase technology. Any transcriptional errors that result from this process are unintentional.

## 2017-08-15 NOTE — Plan of Care (Signed)
  Problem: Activity: Goal: Ability to tolerate increased activity will improve Outcome: Progressing   Problem: Clinical Measurements: Goal: Ability to maintain a body temperature in the normal range will improve Outcome: Progressing   Problem: Respiratory: Goal: Ability to maintain adequate ventilation will improve Outcome: Progressing Goal: Ability to maintain a clear airway will improve Outcome: Progressing   Problem: Education: Goal: Knowledge of General Education information will improve Outcome: Progressing   Problem: Elimination: Goal: Will not experience complications related to urinary retention Outcome: Progressing   Problem: Safety: Goal: Ability to remain free from injury will improve Outcome: Progressing   Problem: Skin Integrity: Goal: Risk for impaired skin integrity will decrease Outcome: Progressing

## 2017-08-15 NOTE — Progress Notes (Signed)
PT Cancellation Note  Patient Details Name: Regina Hood MRN: [id number] DOB: [birth year]   Cancelled Treatment:    Reason Eval/Treat Not Completed: Patient declined, no reason specified.  Pt sleeping in bed upon PT arrival.  Upon waking pt up, pt reporting feeling too tired and too weak to get out of bed or participate in therapy.  Will re-attempt PT treatment session at a later date/time.  SW notified.  Leitha Bleak, PT 08/15/17, 10:39 AM [phone]

## 2017-08-15 NOTE — Care Management Note (Signed)
Case Management Note  Patient Details  Name: Regina Hood MRN: [id number] Date of Birth: 12/23/1940  Subjective/Objective:  Admitted to [facility] with the diagnosis of sepsis. Lives alone, but son Regina Hood lives close-by. Seen Dr. Doy Hutching about a month ago. Prescriptions are filled at [facility]. No home Health. [facility] resident now. Home oxygen per [employer]. Uses 2 liters continuous. Takes care of all basic activities of daily living herself. Doesn't drive anymore since I got sick. No other medical equipment in the home. [facility] prior to previous admission. Fair appetite.                  Action/Plan: Physical therapy evaluation completed. Recommending skilled nursing facility. Waiting for insurance approval. "I really want to go back to [facility]."  Home with Hospice services, if skilled not approved.   Expected Discharge Date:                  Expected Discharge Plan:     In-House Referral:   yes  Discharge planning Services     Post Acute Care Choice:    Choice offered to:     DME Arranged:    DME Agency:     HH Arranged:    HH Agency:     Status of Service:     If discussed at [employer] [facility] [employer], dates discussed:    Additional Comments:  Regina Ammons, RN MSN CCM Care Management [phone] 08/15/2017, 12:48 PM

## 2017-08-15 NOTE — Consult Note (Signed)
[facility] Infectious Disease     Reason for Consult: Sepsis  Referring Physician: Neta Mends Date of Admission:  08/12/2017   Active Problems:   Sepsis [facility])   HPI: Regina Hood is a 77 y.o. female With a complicated history of cholangiocarcinoma now admitted with malaise chills for several days.  On admission she has temperature of 103 and white count of 13.  She was started empirically on aztreonam and vancomycin levofloxacin.  Currently she is on meropenem and metronidazole.  She had blood cultures done 420 that are negative and a urine culture that is negative.  She does have biliary drains in place.  Chest x-ray shows some basilar interstitial lung cardiomegaly.  Her bilirubin has been stable but alk phos elevated at 241.  She does have biliary drains in place.  Procalcitonin was elevated at 0.45 but has increased to 4.81.  Lactic acid was initially elevated as well.  Flu PCR was negative urinalysis was negative.  She is currently on BiPAP for acute respiratory failure.  She is on immunosuppression for lupus.  She is  Past Medical History:  Diagnosis Date  . Arthritis   . Cancer ([place])   . Cellulitis   . Collagen vascular disease ([place])   . COPD (chronic obstructive pulmonary disease) ([place])   . DDD (degenerative disc disease), lumbar   . Depression   . Diabetes mellitus   . Diverticulitis   . GERD (gastroesophageal reflux disease)   . GI bleed   . Headache(784.0)   . Hypertension   . Lupus ([place])   . Ulcerative colitis [facility])    Past Surgical History:  Procedure Laterality Date  . ABDOMINAL HYSTERECTOMY    . BREAST BIOPSY Right   . COLONOSCOPY    . COLONOSCOPY WITH PROPOFOL N/A 08/18/2016   Procedure: COLONOSCOPY WITH PROPOFOL;  Surgeon: Manya Silvas, MD;  Location: [facility] ENDOSCOPY;  Service: Endoscopy;  Laterality: N/A;  . ESOPHAGOGASTRODUODENOSCOPY (EGD) WITH PROPOFOL N/A 08/18/2016   Procedure: ESOPHAGOGASTRODUODENOSCOPY (EGD) WITH PROPOFOL;  Surgeon: Manya Silvas, MD;   Location: [facility] ENDOSCOPY;  Service: Endoscopy;  Laterality: N/A;  . IR BILIARY DRAIN PLACEMENT WITH CHOLANGIOGRAM  06/08/2017  . ORIF HUMERUS FRACTURE  09/17/2011   Procedure: OPEN REDUCTION INTERNAL FIXATION (ORIF) PROXIMAL HUMERUS FRACTURE;  Surgeon: Augustin Schooling, MD;  Location: [place];  Service: Orthopedics;  Laterality: Left;   Social History   Tobacco Use  . Smoking status: Former Smoker    Packs/day: 1.00    Years: 10.00    Pack years: 10.00    Types: Cigarettes    Last attempt to quit: 12/30/1998    Years since quitting: 18.6  . Smokeless tobacco: Never Used  . Tobacco comment: quit smoking in 2003  Substance Use Topics  . Alcohol use: No  . Drug use: No   Family History  Problem Relation Age of Onset  . Breast cancer Mother 55  . COPD Father   . Prostate cancer Brother     Allergies:  Allergies  Allergen Reactions  . Penicillins Nausea Only    Current antibiotics: Antibiotics Given (last 72 hours)    Date/Time Action Medication Dose Rate   08/12/17 2011 Given   metroNIDAZOLE (FLAGYL) tablet 500 mg 500 mg    08/12/17 2012 New Bag/Given   ceFEPIme (MAXIPIME) 1 g in sodium chloride 0.9 % 100 mL IVPB 1 g 200 mL/hr   08/12/17 2101 New Bag/Given   vancomycin (VANCOCIN) IVPB 1000 mg/200 mL premix 1,000 mg 200  mL/hr   08/12/17 2347 Given   hydroxychloroquine (PLAQUENIL) tablet 200 mg 200 mg    08/13/17 0025 New Bag/Given   metroNIDAZOLE (FLAGYL) IVPB 500 mg 500 mg 100 mL/hr   08/13/17 0321 New Bag/Given   vancomycin (VANCOCIN) IVPB 1000 mg/200 mL premix 1,000 mg 200 mL/hr   08/13/17 0846 New Bag/Given   metroNIDAZOLE (FLAGYL) IVPB 500 mg 500 mg 100 mL/hr   08/13/17 0846 New Bag/Given   ceFEPIme (MAXIPIME) 1 g in sodium chloride 0.9 % 100 mL IVPB 1 g 200 mL/hr   08/13/17 0853 Given   hydroxychloroquine (PLAQUENIL) tablet 200 mg 200 mg    08/13/17 1635 New Bag/Given   metroNIDAZOLE (FLAGYL) IVPB 500 mg 500 mg 100 mL/hr   08/13/17 2119 New Bag/Given   ceFEPIme  (MAXIPIME) 1 g in sodium chloride 0.9 % 100 mL IVPB 1 g 200 mL/hr   08/13/17 2121 Given   hydroxychloroquine (PLAQUENIL) tablet 200 mg 200 mg    08/14/17 0157 New Bag/Given   metroNIDAZOLE (FLAGYL) IVPB 500 mg 500 mg 100 mL/hr   08/14/17 0700 New Bag/Given   metroNIDAZOLE (FLAGYL) IVPB 500 mg 500 mg 100 mL/hr   08/14/17 0829 New Bag/Given   ceFEPIme (MAXIPIME) 1 g in sodium chloride 0.9 % 100 mL IVPB 1 g 200 mL/hr   08/14/17 0830 Given   hydroxychloroquine (PLAQUENIL) tablet 200 mg 200 mg    08/14/17 1451 New Bag/Given   metroNIDAZOLE (FLAGYL) IVPB 500 mg 500 mg 100 mL/hr   08/14/17 2022 New Bag/Given   ceFEPIme (MAXIPIME) 1 g in sodium chloride 0.9 % 100 mL IVPB 1 g 200 mL/hr   08/14/17 2233 Given   hydroxychloroquine (PLAQUENIL) tablet 200 mg 200 mg    08/14/17 2329 New Bag/Given   metroNIDAZOLE (FLAGYL) IVPB 500 mg 500 mg 100 mL/hr   08/15/17 0700 New Bag/Given  [given by Maia Bres[place]]   metroNIDAZOLE (FLAGYL) IVPB 500 mg 500 mg 100 mL/hr   08/15/17 9024 Given   hydroxychloroquine (PLAQUENIL) tablet 200 mg 200 mg    08/15/17 0931 New Bag/Given   ceFEPIme (MAXIPIME) 1 g in sodium chloride 0.9 % 100 mL IVPB 1 g 200 mL/hr   08/15/17 1442 New Bag/Given   metroNIDAZOLE (FLAGYL) IVPB 500 mg 500 mg 100 mL/hr      MEDICATIONS: . azaTHIOprine  50 mg Oral Daily  . docusate sodium  100 mg Oral BID  . feeding supplement (ENSURE ENLIVE)  237 mL Oral BID BM  . furosemide  20 mg Intravenous Q8H  . heparin  5,000 Units Subcutaneous Q8H  . hydroxychloroquine  200 mg Oral BID  . insulin aspart  0-5 Units Subcutaneous QHS  . insulin aspart  0-9 Units Subcutaneous TID WC  . metoprolol tartrate  75 mg Oral BID  . pantoprazole  40 mg Oral Daily  . sodium chloride flush  10 mL Intracatheter Q0600  . venlafaxine XR  75 mg Oral TID  . vitamin B-12  1,000 mcg Oral Daily    Review of Systems - 11 systems reviewed and negative per HPI   OBJECTIVE: Temp:  [97.5 F (36.4 C)-101.9 F (38.8  C)] 97.6 F (36.4 C) (04/29 1150) Pulse Rate:  [77-131] 77 (04/29 1150) Resp:  [16-38] 16 (04/29 1150) BP: (105-154)/(55-79) 105/64 (04/29 1150) SpO2:  [96 %-100 %] 100 % (04/29 1150) Weight:  [74.3 kg (163 lb 12.8 oz)] 74.3 kg (163 lb 12.8 oz) (04/29 0354) Physical Exam  Constitutional:  Ill appearing, lethargic but sitting on commode. On  O2 HENT: [place]/AT, PERRLA, no scleral icterus Mouth/Throat: Oropharynx is clear and dry . No oropharyngeal exudate.  Cardiovascular: Normal rate, regular rhythm and normal heart sounds. Pulmonary/Chest: bibasilar crackles and rhonchi, poor air movement R hest Portacath wnl  Neck = supple, no nuchal rigidity Abdominal: distended, mild diffuse tt. Drain on R upper abd Lymphadenopathy: no cervical adenopathy. No axillary adenopathy Neurological: lethargic  Skin: diffuse skin changes with scaly eruption from her lupus/ Psychiatric: a normal mood and affect.  behavior is normal.    LABS: Results for orders placed or performed during the hospital encounter of 08/12/17 (from the past 48 hour(s))  Glucose, capillary     Status: Abnormal   Collection Time: 08/13/17  4:07 PM  Result Value Ref Range   Glucose-Capillary 146 (H) 65 - 99 mg/dL  Glucose, capillary     Status: Abnormal   Collection Time: 08/13/17  9:03 PM  Result Value Ref Range   Glucose-Capillary 202 (H) 65 - 99 mg/dL  CBC     Status: Abnormal   Collection Time: 08/14/17  7:02 AM  Result Value Ref Range   WBC 9.1 3.6 - 11.0 K/uL   RBC 2.94 (L) 3.80 - 5.20 MIL/uL   Hemoglobin 8.5 (L) 12.0 - 16.0 g/dL   HCT 24.9 (L) 35.0 - 47.0 %   MCV 84.9 80.0 - 100.0 fL   MCH 28.9 26.0 - 34.0 pg   MCHC 34.0 32.0 - 36.0 g/dL   RDW 16.1 (H) 11.5 - 14.5 %   Platelets 193 150 - 440 K/uL    Comment: Performed at [facility], [address] [place], [place] [address]  Basic metabolic panel     Status: Abnormal   Collection Time: 08/14/17  7:02 AM  Result Value Ref Range   Sodium 135 135 -  145 mmol/L   Potassium 3.6 3.5 - 5.1 mmol/L   Chloride 101 101 - 111 mmol/L   CO2 28 22 - 32 mmol/L   Glucose, Bld 113 (H) 65 - 99 mg/dL   BUN 22 (H) 6 - 20 mg/dL   Creatinine, Ser 0.81 0.44 - 1.00 mg/dL   Calcium 8.0 (L) 8.9 - 10.3 mg/dL   GFR calc non Af Amer >60 >60 mL/min   GFR calc Af Amer >60 >60 mL/min    Comment: (NOTE) The eGFR has been calculated using the CKD EPI equation. This calculation has not been validated in all clinical situations. eGFR's persistently <60 mL/min signify possible Chronic Kidney Disease.    Anion gap 6 5 - 15    Comment: Performed at [facility], [address] [place], [place] [address]  Glucose, capillary     Status: Abnormal   Collection Time: 08/14/17  8:08 AM  Result Value Ref Range   Glucose-Capillary 109 (H) 65 - 99 mg/dL  Glucose, capillary     Status: Abnormal   Collection Time: 08/14/17 11:27 AM  Result Value Ref Range   Glucose-Capillary 196 (H) 65 - 99 mg/dL  Glucose, capillary     Status: Abnormal   Collection Time: 08/14/17  4:54 PM  Result Value Ref Range   Glucose-Capillary 145 (H) 65 - 99 mg/dL  Glucose, capillary     Status: Abnormal   Collection Time: 08/14/17  8:46 PM  Result Value Ref Range   Glucose-Capillary 192 (H) 65 - 99 mg/dL  Lactic acid, plasma     Status: Abnormal   Collection Time: 08/14/17  8:47 PM  Result Value Ref Range  Lactic Acid, Venous 2.9 (HH) 0.5 - 1.9 mmol/L    Comment: CRITICAL RESULT CALLED TO, READ BACK BY AND VERIFIED WITH YAKANA CROSS AT 2120 ON 08/14/17 RWW Performed at [facility], [address] [place], [place] [address]   CBC     Status: Abnormal   Collection Time: 08/14/17  8:47 PM  Result Value Ref Range   WBC 11.9 (H) 3.6 - 11.0 K/uL   RBC 3.16 (L) 3.80 - 5.20 MIL/uL   Hemoglobin 8.7 (L) 12.0 - 16.0 g/dL   HCT 27.0 (L) 35.0 - 47.0 %   MCV 85.4 80.0 - 100.0 fL   MCH 27.6 26.0 - 34.0 pg   MCHC 32.3 32.0 - 36.0 g/dL   RDW 16.2 (H) 11.5 - 14.5 %   Platelets  206 150 - 440 K/uL    Comment: Performed at [facility], [address] [place], [place] [address]  Comprehensive metabolic panel     Status: Abnormal   Collection Time: 08/14/17  8:47 PM  Result Value Ref Range   Sodium 133 (L) 135 - 145 mmol/L   Potassium 3.7 3.5 - 5.1 mmol/L   Chloride 98 (L) 101 - 111 mmol/L   CO2 27 22 - 32 mmol/L   Glucose, Bld 193 (H) 65 - 99 mg/dL   BUN 20 6 - 20 mg/dL   Creatinine, Ser 0.66 0.44 - 1.00 mg/dL   Calcium 8.3 (L) 8.9 - 10.3 mg/dL   Total Protein 6.2 (L) 6.5 - 8.1 g/dL   Albumin 2.6 (L) 3.5 - 5.0 g/dL   AST 41 15 - 41 U/L   ALT 18 14 - 54 U/L   Alkaline Phosphatase 274 (H) 38 - 126 U/L   Total Bilirubin 0.7 0.3 - 1.2 mg/dL   GFR calc non Af Amer >60 >60 mL/min   GFR calc Af Amer >60 >60 mL/min    Comment: (NOTE) The eGFR has been calculated using the CKD EPI equation. This calculation has not been validated in all clinical situations. eGFR's persistently <60 mL/min signify possible Chronic Kidney Disease.    Anion gap 8 5 - 15    Comment: Performed at [facility], [address] [place], [place] [address]  Basic metabolic panel     Status: Abnormal   Collection Time: 08/15/17  6:11 AM  Result Value Ref Range   Sodium 134 (L) 135 - 145 mmol/L   Potassium 3.3 (L) 3.5 - 5.1 mmol/L   Chloride 97 (L) 101 - 111 mmol/L   CO2 31 22 - 32 mmol/L   Glucose, Bld 119 (H) 65 - 99 mg/dL   BUN 16 6 - 20 mg/dL   Creatinine, Ser 0.65 0.44 - 1.00 mg/dL   Calcium 7.9 (L) 8.9 - 10.3 mg/dL   GFR calc non Af Amer >60 >60 mL/min   GFR calc Af Amer >60 >60 mL/min    Comment: (NOTE) The eGFR has been calculated using the CKD EPI equation. This calculation has not been validated in all clinical situations. eGFR's persistently <60 mL/min signify possible Chronic Kidney Disease.    Anion gap 6 5 - 15    Comment: Performed at [facility], [address] [place], [place] [address]  Lactic acid, plasma     Status: None    Collection Time: 08/15/17  6:11 AM  Result Value Ref Range   Lactic Acid, Venous 1.0 0.5 - 1.9 mmol/L    Comment: Performed at [facility], [address] [place], [place] [address]  CBC     Status: Abnormal   Collection Time: 08/15/17  6:11 AM  Result Value Ref Range   WBC 8.4 3.6 - 11.0 K/uL   RBC 2.90 (L) 3.80 - 5.20 MIL/uL   Hemoglobin 8.3 (L) 12.0 - 16.0 g/dL   HCT 24.4 (L) 35.0 - 47.0 %   MCV 84.0 80.0 - 100.0 fL   MCH 28.7 26.0 - 34.0 pg   MCHC 34.2 32.0 - 36.0 g/dL   RDW 15.9 (H) 11.5 - 14.5 %   Platelets 183 150 - 440 K/uL    Comment: Performed at [facility], [address] [place], [place] [address]  Procalcitonin - Baseline     Status: None   Collection Time: 08/15/17  6:11 AM  Result Value Ref Range   Procalcitonin 1.14 ng/mL    Comment:        Interpretation: PCT > 0.5 ng/mL and <= 2 ng/mL: Systemic infection (sepsis) is possible, but other conditions are known to elevate PCT as well. (NOTE)       Sepsis PCT Algorithm           Lower Respiratory Tract                                      Infection PCT Algorithm    ----------------------------     ----------------------------         PCT < 0.25 ng/mL                PCT < 0.10 ng/mL         Strongly encourage             Strongly discourage   discontinuation of antibiotics    initiation of antibiotics    ----------------------------     -----------------------------       PCT 0.25 - 0.50 ng/mL            PCT 0.10 - 0.25 ng/mL               OR       >80% decrease in PCT            Discourage initiation of                                            antibiotics      Encourage discontinuation           of antibiotics    ----------------------------     -----------------------------         PCT >= 0.50 ng/mL              PCT 0.26 - 0.50 ng/mL                AND       <80% decrease in PCT             Encourage initiation of                                             antibiotics        Encourage continuation  of antibiotics    ----------------------------     -----------------------------        PCT >= 0.50 ng/mL                  PCT > 0.50 ng/mL               AND         increase in PCT                  Strongly encourage                                      initiation of antibiotics    Strongly encourage escalation           of antibiotics                                     -----------------------------                                           PCT <= 0.25 ng/mL                                                 OR                                        > 80% decrease in PCT                                     Discontinue / Do not initiate                                             antibiotics Performed at [facility], [address] [place], [place] [address]   Glucose, capillary     Status: Abnormal   Collection Time: 08/15/17  8:09 AM  Result Value Ref Range   Glucose-Capillary 107 (H) 65 - 99 mg/dL  Glucose, capillary     Status: Abnormal   Collection Time: 08/15/17 11:47 AM  Result Value Ref Range   Glucose-Capillary 155 (H) 65 - 99 mg/dL   No components found for: ESR, C REACTIVE PROTEIN MICRO: Recent Results (from the past 720 hour(s))  Blood Culture (routine x 2)     Status: None   Collection Time: 08/06/17 11:28 PM  Result Value Ref Range Status   Specimen Description BLOOD BLOOD RIGHT HAND  Final   Special Requests   Final    BOTTLES DRAWN AEROBIC AND ANAEROBIC Blood Culture adequate volume   Culture   Final    NO GROWTH 5 DAYS Performed at [facility], [address], [place], [place] [address]    Report Status 08/11/2017 FINAL  Final  Blood Culture (routine x 2)     Status: None  Collection Time: 08/06/17 11:28 PM  Result Value Ref Range Status   Specimen Description BLOOD BLOOD LEFT HAND  Final   Special Requests   Final    BOTTLES DRAWN AEROBIC AND ANAEROBIC Blood Culture adequate volume   Culture    Final    NO GROWTH 5 DAYS Performed at [facility], [address], [place], [place] [address]    Report Status 08/11/2017 FINAL  Final  Urine culture     Status: None   Collection Time: 08/06/17 11:28 PM  Result Value Ref Range Status   Specimen Description   Final    URINE, RANDOM Performed at [facility], [address] [place], [place] [address]    Special Requests   Final    NONE Performed at [facility], [address], [place], [place] [address]    Culture   Final    NO GROWTH Performed at [facility], [place] [address] [place], [place] [address]    Report Status 08/08/2017 FINAL  Final  Blood Culture (routine x 2)     Status: None (Preliminary result)   Collection Time: 08/12/17  7:04 PM  Result Value Ref Range Status   Specimen Description BLOOD RIGHT HAND  Final   Special Requests   Final    BOTTLES DRAWN AEROBIC AND ANAEROBIC Blood Culture adequate volume   Culture   Final    NO GROWTH 2 DAYS Performed at [facility], [address] [place], [place] [address]    Report Status PENDING  Incomplete  Blood Culture (routine x 2)     Status: None (Preliminary result)   Collection Time: 08/12/17  7:05 PM  Result Value Ref Range Status   Specimen Description BLOOD RIGHT ANTECUBITAL  Final   Special Requests   Final    BOTTLES DRAWN AEROBIC AND ANAEROBIC Blood Culture results may not be optimal due to an excessive volume of blood received in culture bottles   Culture   Final    NO GROWTH 2 DAYS Performed at [facility], [address] [place], [place] [address]    Report Status PENDING  Incomplete  MRSA PCR Screening     Status: None   Collection Time: 08/12/17 11:17 PM  Result Value Ref Range Status   MRSA by PCR NEGATIVE NEGATIVE Final    Comment:        The GeneXpert MRSA Assay (FDA approved for NASAL specimens only), is one component of a comprehensive MRSA colonization surveillance program. It is  not intended to diagnose MRSA infection nor to guide or monitor treatment for MRSA infections. Performed at [facility], [address] [place],  [address]     IMAGING: Dg Chest 1 View  Result Date: 08/14/2017 CLINICAL DATA:  Hypoxia EXAM: CHEST  1 VIEW COMPARISON:  08/12/2017, CT 03/16/2017 FINDINGS: Stable cardiomegaly and aortic atherosclerosis. Port catheter tip terminates in the distal SVC. Chronic coarsened interstitial lung markings and atelectasis is redemonstrated. Calcified pleural plaque at the right lung base. Chain sutures project over the right lung base as well. Superimposed interstitial edema suspected. Trace bilateral pleural effusions are present. Tiny nodular densities project over the left upper lobe and are believed to represent tiny foci pleural thickening based on prior CT. Small pulmonary nodules are not entirely excluded. Left humerus plate and screw fixation. IMPRESSION: 1. Cardiomegaly with aortic atherosclerosis. Diffuse coarsened interstitial lung markings and atelectasis with probable diffuse interstitial edema. Trace bilateral pleural effusions. 2. Calcified  pleural plaque at the right lung base. 3. Tiny nodular densities overlying the left upper lobe, nonspecific. Pulmonary nodules versus focal pleural plaque. Electronically Signed   By: Ashley Royalty M.D.   On: 08/14/2017 22:23   Dg Chest 2 View  Result Date: 08/15/2017 CLINICAL DATA:  Chronic respiratory failure, interstitial lung disease. Productive cough, shortness of breath, wheezing EXAM: CHEST - 2 VIEW COMPARISON:  Chest x-ray 08/14/2017.  Chest CT 08/15/2017. FINDINGS: Severe chronic interstitial lung disease and fibrosis changes. Possible superimposed edema. Heart is borderline in size. Right Port-A-Cath remains in place, unchanged. Biliary drains noted in the right abdomen. IMPRESSION: Stable chronic interstitial lung disease/fibrosis. Possible superimposed edema, stable. Electronically  Signed   By: Rolm Baptise M.D.   On: 08/15/2017 07:59   Ct Chest W Contrast  Result Date: 08/15/2017 CLINICAL DATA:  Sepsis. Fever. Tachycardia. Tachypnea. Cholangiocarcinoma with biliary tubes. History of ulcerative colitis, diverticulitis, sarcoidosis. EXAM: CT CHEST, ABDOMEN, AND PELVIS WITH CONTRAST TECHNIQUE: Multidetector CT imaging of the chest, abdomen and pelvis was performed following the standard protocol during bolus administration of intravenous contrast. CONTRAST:  30m ISOVUE-370 IOPAMIDOL (ISOVUE-370) INJECTION 76% COMPARISON:  03/16/2017 CT chest.  08/08/2017 CT abdomen and pelvis. FINDINGS: CT CHEST FINDINGS Cardiovascular: Normal caliber thoracic aorta with scattered calcification. Mild cardiac enlargement. Calcification in the mitral and aortic valves. No pericardial effusion. Central venous catheter tip in the SVC. Mediastinum/Nodes: Diffuse mediastinal lymphadenopathy. Largest pretracheal nodes measure about 13 mm short axis dimension. Aortopulmonic window node measures 13 mm short axis dimension. Similar to previous study. Esophagus is decompressed. Retrocrural lymphadenopathy. Diffuse multinodular enlargement of the thyroid gland with right thyroid calcifications. Likely multinodular goiter. Lungs/Pleura: Diffuse mosaic attenuation pattern in the lungs with patchy peripheral fibrosis and coarse fibrosis in the lung bases with honeycomb change and bronchiectasis. Changes likely represent chronic fibrosis and bronchiectasis with superimposed edema. Calcified pleural plaques. No pleural effusions. No pneumothorax. Airways are patent. Musculoskeletal: Degenerative changes in the spine. Vertebral hemangiomas. No destructive bone lesions. CT ABDOMEN PELVIS FINDINGS Hepatobiliary: Internal external biliary drainage catheter is in place with left and right insertion drainage catheters. Distal pigtails in the duodenum. No bile duct dilatation. Gallbladder is contracted with gas in the gallbladder  consistent with instrumentation. Postoperative partial left hepatectomy. Pancreas: Unremarkable. No pancreatic ductal dilatation or surrounding inflammatory changes. Spleen: Normal in size without focal abnormality. Adrenals/Urinary Tract: Adrenal glands are unremarkable. Kidneys are normal, without renal calculi, focal lesion, or hydronephrosis. Bladder is unremarkable. Stomach/Bowel: Stomach, small bowel, and colon are not abnormally distended. Contrast material is present in the cecum suggesting no evidence of small bowel obstruction. Scattered stool throughout the colon. Diverticulosis of the colon. No inflammatory changes. Vascular/Lymphatic: Calcification of the aorta without aneurysm. Mild prominence of retroperitoneal lymph nodes without pathologic enlargement. Reproductive: Status post hysterectomy. No adnexal masses. Other: There is a small amount of free fluid in the pericolic gutters and in the pelvis, new since prior study. No loculated fluid collections to suggest abscess. Edema in the subcutaneous fatty tissues. No free air. Musculoskeletal: Degenerative changes in the spine. No destructive bone lesions. IMPRESSION: 1. Chronic fibrosis in the lungs with basilar honeycomb changes and bronchiectasis. 2. Superimposed edema in the lungs. 3. Mediastinal lymphadenopathy similar to previous study. 4. Internal external biliary drainage catheters remain in place. No bile duct dilatation. Pneumobilia consistent with procedures. 5. Small amount of free fluid in the pericolic gutters and in the pelvis is new since previous study. No loculated collection to suggest abscess. 6. Retrocrural and  retroperitoneal lymphadenopathy is unchanged. Electronically Signed   By: Lucienne Capers M.D.   On: 08/15/2017 05:15   Ct Abdomen Pelvis W Contrast  Result Date: 08/15/2017 CLINICAL DATA:  Sepsis. Fever. Tachycardia. Tachypnea. Cholangiocarcinoma with biliary tubes. History of ulcerative colitis, diverticulitis,  sarcoidosis. EXAM: CT CHEST, ABDOMEN, AND PELVIS WITH CONTRAST TECHNIQUE: Multidetector CT imaging of the chest, abdomen and pelvis was performed following the standard protocol during bolus administration of intravenous contrast. CONTRAST:  58m ISOVUE-370 IOPAMIDOL (ISOVUE-370) INJECTION 76% COMPARISON:  03/16/2017 CT chest.  08/08/2017 CT abdomen and pelvis. FINDINGS: CT CHEST FINDINGS Cardiovascular: Normal caliber thoracic aorta with scattered calcification. Mild cardiac enlargement. Calcification in the mitral and aortic valves. No pericardial effusion. Central venous catheter tip in the SVC. Mediastinum/Nodes: Diffuse mediastinal lymphadenopathy. Largest pretracheal nodes measure about 13 mm short axis dimension. Aortopulmonic window node measures 13 mm short axis dimension. Similar to previous study. Esophagus is decompressed. Retrocrural lymphadenopathy. Diffuse multinodular enlargement of the thyroid gland with right thyroid calcifications. Likely multinodular goiter. Lungs/Pleura: Diffuse mosaic attenuation pattern in the lungs with patchy peripheral fibrosis and coarse fibrosis in the lung bases with honeycomb change and bronchiectasis. Changes likely represent chronic fibrosis and bronchiectasis with superimposed edema. Calcified pleural plaques. No pleural effusions. No pneumothorax. Airways are patent. Musculoskeletal: Degenerative changes in the spine. Vertebral hemangiomas. No destructive bone lesions. CT ABDOMEN PELVIS FINDINGS Hepatobiliary: Internal external biliary drainage catheter is in place with left and right insertion drainage catheters. Distal pigtails in the duodenum. No bile duct dilatation. Gallbladder is contracted with gas in the gallbladder consistent with instrumentation. Postoperative partial left hepatectomy. Pancreas: Unremarkable. No pancreatic ductal dilatation or surrounding inflammatory changes. Spleen: Normal in size without focal abnormality. Adrenals/Urinary Tract:  Adrenal glands are unremarkable. Kidneys are normal, without renal calculi, focal lesion, or hydronephrosis. Bladder is unremarkable. Stomach/Bowel: Stomach, small bowel, and colon are not abnormally distended. Contrast material is present in the cecum suggesting no evidence of small bowel obstruction. Scattered stool throughout the colon. Diverticulosis of the colon. No inflammatory changes. Vascular/Lymphatic: Calcification of the aorta without aneurysm. Mild prominence of retroperitoneal lymph nodes without pathologic enlargement. Reproductive: Status post hysterectomy. No adnexal masses. Other: There is a small amount of free fluid in the pericolic gutters and in the pelvis, new since prior study. No loculated fluid collections to suggest abscess. Edema in the subcutaneous fatty tissues. No free air. Musculoskeletal: Degenerative changes in the spine. No destructive bone lesions. IMPRESSION: 1. Chronic fibrosis in the lungs with basilar honeycomb changes and bronchiectasis. 2. Superimposed edema in the lungs. 3. Mediastinal lymphadenopathy similar to previous study. 4. Internal external biliary drainage catheters remain in place. No bile duct dilatation. Pneumobilia consistent with procedures. 5. Small amount of free fluid in the pericolic gutters and in the pelvis is new since previous study. No loculated collection to suggest abscess. 6. Retrocrural and retroperitoneal lymphadenopathy is unchanged. Electronically Signed   By: WLucienne CapersM.D.   On: 08/15/2017 05:15   Ct Abdomen Pelvis W Contrast  Result Date: 08/09/2017 CLINICAL DATA:  History of cholangiocarcinoma with fever. EXAM: CT ABDOMEN AND PELVIS WITH CONTRAST TECHNIQUE: Multidetector CT imaging of the abdomen and pelvis was performed using the standard protocol following bolus administration of intravenous contrast. CONTRAST:  [id number]-300 IOPAMIDOL (ISOVUE-300) INJECTION 61% COMPARISON:  06/07/2017 FINDINGS: Lower chest: Stable dense  pulmonary scarring changes and emphysema. Heart is normal in size. Calcifications noted at the mitral valve annulus. Hepatobiliary: Stable biliary drainage catheters. No complicating features. No intrahepatic biliary dilatation.  Stable surgical changes from a partial hepatectomy. Stable air in the gallbladder. No intrahepatic abscess. Pancreas: No mass, inflammation or ductal dilatation. Spleen: Normal size.  No focal lesions. Adrenals/Urinary Tract: Stable mild nodularity of the left adrenal gland. Both kidneys are unremarkable and stable. The bladder appears normal. Stomach/Bowel: The stomach, duodenum, small bowel and colon are grossly normal. No acute inflammatory changes, mass lesions or obstructive findings. No evidence of bowel perforation. Contrast gets all the way to the colon. Moderate sigmoid diverticulosis. Vascular/Lymphatic: Stable aortic calcifications. No aneurysm or dissection. The branch vessels are patent. The major venous structures are patent. Stable retrocrural and retroperitoneal lymph nodes. Reproductive: The uterus is surgically absent. Left ovary is still present and appears normal. Small cyst is noted. The right ovary is not identified for certain. Other: No ascites. Surgical changes involving the anterior abdominal wall. Musculoskeletal: No significant bony findings. IMPRESSION: 1. Stable biliary drainage catheters. No complicating features. No biliary distention. 2. Stable surgical changes from partial hepatectomy and Roux-en-Y procedure. 3. No abdominal/pelvic abscess or evidence of perforation or obstruction. 4. Persistent retrocrural and retroperitoneal adenopathy. Electronically Signed   By: Marijo Sanes M.D.   On: 08/09/2017 11:29   Dg Chest Portable 1 View  Result Date: 08/12/2017 CLINICAL DATA:  Shortness of breath. Altered mental status. Fever. Patient was seen here 2 days ago for respiratory failure and sepsis. EXAM: PORTABLE CHEST 1 VIEW COMPARISON:  08/12/2017 FINDINGS:  Cardiac enlargement with pulmonary vascular congestion. Increasing lung infiltrates likely representing increasing edema. Superimposed pneumonia may also be present. Small bilateral pleural effusions. No pneumothorax. Power port type right central venous catheter with tip over the cavoatrial junction region. Surgical clips in the right lung. Postoperative changes in the left shoulder. IMPRESSION: Cardiac enlargement with pulmonary vascular congestion. Increasing lung infiltrates bilaterally likely represent increasing edema. Superimposed pneumonia is also possible. Electronically Signed   By: Lucienne Capers M.D.   On: 08/12/2017 21:18   Dg Chest Port 1 View  Result Date: 08/12/2017 CLINICAL DATA:  Altered mental status and fever EXAM: PORTABLE CHEST 1 VIEW COMPARISON:  Chest radiograph 08/07/2017 FINDINGS: Power injectable right chest wall Port-A-Cath follows and internal jugular vein approach with tip in the lower SVC. There is cardiomegaly with enlarged pulmonary arteries. Bibasilar predominant interstitial opacities are unchanged. No pneumothorax or sizable pleural effusion. Postsurgical changes in the lower right lung. IMPRESSION: Unchanged appearance of the chest compared to 08/07/2017 radiograph with persistent cardiomegaly and enlarged pulmonary arteries. Bibasilar interstitial opacities are also unchanged. Electronically Signed   By: Ulyses Jarred M.D.   On: 08/12/2017 19:12   Dg Chest Port 1 View  Result Date: 08/07/2017 CLINICAL DATA:  Short of breath and cough. Lupus. Hypertension. COPD. Diabetes. EXAM: PORTABLE CHEST 1 VIEW COMPARISON:  One day prior FINDINGS: Right Port-A-Cath terminates at the mid SVC. Midline trachea. Cardiomegaly accentuated by AP portable technique. No pleural effusion or pneumothorax. Basilar and peripheral predominant interstitial thickening is similar, given differences in technique. No well-defined lobar consolidation. Surgical clips project over the right lung base.  IMPRESSION: Basilar predominant interstitial thickening, likely all attributed to interstitial lung disease when correlated with 02/24/2017 CT. Cardiomegaly without convincing evidence of superimposed congestive failure. Electronically Signed   By: Abigail Miyamoto M.D.   On: 08/07/2017 07:44   Dg Chest Port 1 View  Result Date: 08/06/2017 CLINICAL DATA:  Sepsis EXAM: PORTABLE CHEST 1 VIEW COMPARISON:  04/29/2017 FINDINGS: Right Port-A-Cath is unchanged. Cardiomegaly with vascular congestion. Interstitial prominence throughout the lungs could reflect interstitial  edema. Bibasilar atelectasis. No visible effusions. IMPRESSION: Cardiomegaly with vascular congestion and interstitial prominence, possibly representing interstitial edema. Bibasilar atelectasis. Electronically Signed   By: Rolm Baptise M.D.   On: 08/06/2017 23:49    Assessment:   DELECIA VASTINE is a 77 y.o. 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 She is chornically ill appearing, hypoxic (has ILD).  CT chest abd pelvis neg except some edema in lungs on top of fibrosis changes.  At her last admission lfts only mildly elevated, cxr shows chronic changes, flu pcr negative, ua negative. Schlusser neg.  She does have a portacath in place but it is not tender or inflamed. CT abd negative. She improved with IV abx and was dced on oral cipro and flagyl.  So far she has had extensive evaluation for a source of sepsis with all imaging and cultures negative.  She was on chronic prednisone for several months but then this was stopped in April. Query adrenal insufficiency.   Recommendations Try to get sputum culture.  Check AM cortisol level.  Add azithromycin for atypical coverage. Dc flagyl Cont cefepime for now.  Thank you very much for allowing me to participate in the care of this patient. Please call with questions.   Cheral Marker. Ola Spurr, MD

## 2017-08-15 NOTE — Progress Notes (Signed)
*  PRELIMINARY RESULTS* Echocardiogram 2D Echocardiogram has been performed.  Regina Hood 08/15/2017, 4:27 PM

## 2017-08-15 NOTE — Consult Note (Signed)
Consultation Note Date: 08/15/2017   Patient Name: Regina Hood  DOB: [birth year]  MRN: [id number]  Age / Sex: 77 y.o., female  PCP: Idelle Crouch, MD Referring Physician: Bettey Costa, MD  Reason for Consultation: Establishing goals of care  HPI/Patient Profile: 77 y.o. female  admitted on 08/12/2017 from [facility] with altered mental status and fever. She has a past medical history of arthritis, hypertension,  interstitial lung disease (COPD), chronic respiratory failure on 2 L continuous oxygen at home, SLE, depression, diabetes, GERD, ulcerative colitis, and cholangiocarcinoma with biliary drains in place. On admission patient was unable to provide history due to AMS. She was was noted to have productive cough, shortness of breath and wheezing according to facility. She was just discharged from our hospital 2 days ago; due to possible sepsis and acute on chronic respiratory failure; she was treated and discharged on Cipro and Flagyl p.o. During her ED course she was tachycardic(HR 120s) and tachypneic (RR 27).  Temperature was 100.1.  Blood test were unremarkable for elevated lactic acid at 3.8 and troponin level at 0.05.  Potassium level is 3.3.  WBCs within normal limits. EKG is noted with sinus tachycardia but no acute ischemic changes. Chest x-ray,  showed bilateral pulmonary infiltrates and pulmonary vascular congestion. Since admission patient receiving antibiotics (cefepime and flagyl) for sepsis due to HCAP and electrolyte replacement. Palliative Medicine consulted for goals of care discussion.   Clinical Assessment and Goals of Care: I have reviewed medical records including lab results, imaging, Epic notes, and MAR, received report from the bedside RN, and assessed the patient. I then met at the bedside with patient to discuss diagnosis prognosis, GOC, EOL wishes, disposition and options.  She is alert  and oriented x3.  Verbalizes she has 2 sons however, she would like to proceed with conversation and can update them with any decisions and the content of conversation.  Patient is able to appropriately engage make decisions in goals of care conversation.  I introduced Palliative Medicine as specialized medical care for people living with serious illness. It focuses on providing relief from the symptoms and stress of a serious illness. The goal is to improve quality of life for both the patient and the family.  Verbalized receiving outpatient palliative care services in the home prior to admission.  We discussed a brief life review of the patient.  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  She reports she retired many years ago when they tore down the old location.  She states she has 2 sons who both live locally, however she lives alone when she is at home.  States her sons are her major support system and they often check on her and keep in close contact.  She is a woman of [place] and loves to be outside.  She also allows playing with her pug, Lorre Nick.   As far as functional and nutritional status patient states she began noticing a decline in her health after receiving  her cancer diagnosis in August 2018.  At that time she noticed a change in her appetite and some generalized weakness.  She has been undergoing chemotherapy here at the [facility] center.  Reports he has been sometime since she has received chemotherapy.  She states she has noticed a decline in her health since her last admission which now requires her to ambulate with a walker.  She also requires assistance with her ADLs and she has decreased strength and easily tired.  Her appetite is fair and she feels is much better when she is taking prednisone however over the past 2 to 3 weeks she has not eaten much because of a decrease in appetite.  She is unsure if she has lost any weight.  We  discussed her current illness and what it means in the larger context of her on-going co-morbidities.  Natural disease trajectory and expectations at EOL were discussed.  She tearfully states I know I have a lot of things going on in my health right now and will eventually die from all of this, but that she was okay with it.  She states she has had this conversation with her signs and one is more accepting than the other.  She states her one son often cries but tries not to let her know it.  I attempted to elicit values and goals of care important to the patient.    The difference between aggressive medical intervention and comfort care was considered in light of the patient's goals of care.  At this time patient would like to continue to treat the treatable while hospitalized.  She feels that going back to a rehabilitation facility may help her some however she also knows that she may not have the energy or receive approval to return to [facility].  She also voices that she preferred not to return to [facility] but at this point that may be her only option versus going home.  Her concern is returning home alone knowing her sons are a big support, however they both work and generally are unavailable until after lunchtime.   Advanced directives, concepts specific to code status, artifical feeding and hydration, and rehospitalization were considered and discussed.  Patient requests to continue to be a DNR/DNR.  She verbalized her that she is not interested in any forms of artificial feedings such as PEG tube feedings.  Hospice and Palliative Care services outpatient were explained and offered.  Again patient verbalizes she is familiar with palliative services, however if she is unable to return to [facility] she is considering going home with hospice services in place.  Patient verbalizes based on her disposition this may require conversation with her oncologist as she is aware of what their  perspective is and that means a lot to her.  We discussed in detail the difference between palliative and hospice services.  Patient verbalized her understanding and stated again if she is unable to return to [facility] for rehabilitation her goal is to return home with hospice services.  Questions and concerns were addressed. The family was encouraged to call with questions or concerns.  PMT will continue to support holistically.   PATIENT is alert and oriented and able to make medical decisions for herself.  However if patient  is unable to make medical decisions she verbalized her son Elta Gu[place]oupe would be her decision-maker.  He verbalizes he is aware of this information and has documentation.  SUMMARY OF RECOMMENDATIONS    Continue  DNR/DNI at patient's request  To need to treat the treatable while hospitalized at patient's request.  Agree with CSW plan to attempt authorization to secure disposition back to [facility] versus patient going home with hospice services and any other home health needs based on medical team's recommendation.  Would recommend oncologist to see patient and provide input regarding future plans.  Recommend patient be discharged with hospice services if not returning to facility. If approval obtained for rehabilitation and return to [facility] would recommend palliative services at minimum.   Palliative Medicine team will continue to support patient, patient's family, and medical team during hospitalization.   Code Status/Advance Care Planning:  DNR/DNI  Palliative Prophylaxis:   Aspiration, Bowel Regimen, Frequent Pain Assessment and Turn Reposition  Additional Recommendations (Limitations, Scope, Preferences):  Full Scope Treatment to use to treat the treatable while hospitalized at patient's request.  Psycho-social/Spiritual:   Desire for further Chaplaincy support:NO  Prognosis:   Unable to determine to poor in the setting of  cholangiocarcinoma, poor p.o. intake, decreased mobility, COPD, chronic home use of oxygen, generalized weakness, recurrent pneumonia/sepsis, and SLE.   Discharge Planning: To Be Determined      Primary Diagnoses: Present on Admission: . Sepsis ([place])   I have reviewed the medical record, interviewed the patient and family, and examined the patient. The following aspects are pertinent.  Past Medical History:  Diagnosis Date  . Arthritis   . Cancer ([place])   . Cellulitis   . Collagen vascular disease ([place])   . COPD (chronic obstructive pulmonary disease) ([place])   . DDD (degenerative disc disease), lumbar   . Depression   . Diabetes mellitus   . Diverticulitis   . GERD (gastroesophageal reflux disease)   . GI bleed   . Headache(784.0)   . Hypertension   . Lupus ([place])   . Ulcerative colitis ([place])    Social History   Socioeconomic History  . Marital status: Widowed    Spouse name: Not on file  . Number of children: 2  . Years of education: Not on file  . Highest education level: Not on file  Occupational History  . Not on file  Social Needs  . Financial resource strain: Not hard at all  . Food insecurity:    Worry: Never true    Inability: Never true  . Transportation needs:    Medical: No    Non-medical: No  Tobacco Use  . Smoking status: Former Smoker    Packs/day: 1.00    Years: 10.00    Pack years: 10.00    Types: Cigarettes    Last attempt to quit: 12/30/1998    Years since quitting: 18.6  . Smokeless tobacco: Never Used  . Tobacco comment: quit smoking in 2003  Substance and Sexual Activity  . Alcohol use: No  . Drug use: No  . Sexual activity: Not Currently  Lifestyle  . Physical activity:    Days per week: 0 days    Minutes per session: 0 min  . Stress: Only a little  Relationships  . Social connections:    Talks on phone: Once a week    Gets together: Once a week    Attends religious service: More than 4 times per year    Active member of club  or organization: Yes    Attends meetings of clubs or organizations: 1 to 4 times per year    Relationship status: Widowed  Other Topics Concern  . Not on file  Social History Narrative   Live alone, independent   Family History  Problem Relation Age of Onset  . Breast cancer Mother 76  . COPD Father   . Prostate cancer Brother    Scheduled Meds: . azaTHIOprine  50 mg Oral Daily  . docusate sodium  100 mg Oral BID  . feeding supplement (ENSURE ENLIVE)  237 mL Oral BID BM  . furosemide  20 mg Intravenous Q8H  . heparin  5,000 Units Subcutaneous Q8H  . hydroxychloroquine  200 mg Oral BID  . insulin aspart  0-5 Units Subcutaneous QHS  . insulin aspart  0-9 Units Subcutaneous TID WC  . metoprolol tartrate  75 mg Oral BID  . pantoprazole  40 mg Oral Daily  . sodium chloride flush  10 mL Intracatheter Q0600  . venlafaxine XR  75 mg Oral TID  . vitamin B-12  1,000 mcg Oral Daily   Continuous Infusions: . sodium chloride Stopped (08/14/17 2130)  . ceFEPime (MAXIPIME) IV Stopped (08/15/17 1007)  . metronidazole 500 mg (08/15/17 1442)   PRN Meds:.acetaminophen **OR** acetaminophen, bisacodyl, HYDROcodone-acetaminophen, loperamide, LORazepam, ondansetron **OR** ondansetron (ZOFRAN) IV, traZODone Medications Prior to Admission:  Prior to Admission medications   Medication Sig Start Date End Date Taking? Authorizing Provider  acetaminophen (TYLENOL) 500 MG tablet Take 1,000 mg by mouth 3 (three) times daily.   Yes [provider]  azaTHIOprine (IMURAN) 50 MG tablet Take 50 mg by mouth daily.   Yes [provider]  ciprofloxacin (CIPRO) 500 MG tablet Take 1 tablet (500 mg total) by mouth 2 (two) times daily for 5 days. 08/10/17 08/15/17 Yes Mody, Ulice Bold, MD  Cyanocobalamin (VITAMIN B-12 PO) Take 1,000 mcg by mouth daily.   Yes [provider]  diphenoxylate-atropine (LOMOTIL) 2.5-0.025 MG tablet Take 2 tablets by mouth 4 (four) times daily as needed for diarrhea or  loose stools. 07/07/17  Yes Burns, Wandra Feinstein, NP  Fluticasone-Salmeterol (ADVAIR DISKUS) 100-50 MCG/DOSE AEPB Inhale 1 puff into the lungs 2 (two) times daily. 07/18/17  Yes Flora Lipps, MD  hydroxychloroquine (PLAQUENIL) 200 MG tablet Take 200 mg by mouth 2 (two) times daily.    Yes [provider]  insulin glargine (LANTUS) 100 UNIT/ML injection Inject 5 Units into the skin daily.   Yes [provider]  lidocaine-prilocaine (EMLA) cream Apply 1 application topically as needed. 02/16/17  Yes Cammie Sickle, MD  magnesium chloride (SLOW-MAG) 64 MG TBEC SR tablet Take 1 tablet (64 mg total) by mouth daily. 07/13/17  Yes Burns, Wandra Feinstein, NP  metFORMIN (GLUCOPHAGE) 500 MG tablet Take 500 mg by mouth 2 (two) times daily with a meal.   Yes [provider]  Metoprolol Tartrate 75 MG TABS Take 75 mg by mouth 2 (two) times daily. 08/10/17  Yes Mody, Ulice Bold, MD  metroNIDAZOLE (FLAGYL) 500 MG tablet Take 1 tablet (500 mg total) by mouth every 8 (eight) hours for 5 days. 08/10/17 08/15/17 Yes Mody, Sital, MD  ondansetron (ZOFRAN) 8 MG tablet TAKE ONE TABLET EVERY 12 HOURS AS NEEDED Patient taking differently: Take 1 tablet [facility]) by mouth every 12 hours as needed for nausea and/or vomiting 04/06/17  Yes Earlie Server, MD  pantoprazole (PROTONIX) 40 MG tablet Take 1 tablet (40 mg total) by mouth daily. 05/23/17  Yes Earlie Server, MD  Sodium Chloride Flush (NORMAL SALINE FLUSH) 0.9 % SOLN 1 mL by Other route every 8 (eight) hours. Flush each biliary drain with 1 mL twice daily 06/24/17  Yes Earlie Server,  MD  triamcinolone cream (KENALOG) 0.1 % Apply 1 application topically daily.  07/20/17  Yes [provider]  venlafaxine XR (EFFEXOR-XR) 75 MG 24 hr capsule Take 75 mg by mouth daily.    Yes [provider]   Allergies  Allergen Reactions  . Penicillins Nausea Only   Review of Systems  Constitutional: Positive for activity change, appetite change and fatigue.  Respiratory:  Positive for cough and shortness of breath.   Neurological: Positive for weakness.  All other systems reviewed and are negative.   Physical Exam  Constitutional: She appears well-developed. She is cooperative.  Cardiovascular: Normal rate, regular rhythm, intact distal pulses and normal pulses.  Pulmonary/Chest: Effort normal. She has decreased breath sounds.  Abdominal: Bowel sounds are normal.  Biliary drains   Musculoskeletal:  Generalized weakness   Neurological: She is alert.  Psychiatric: She has a normal mood and affect. Her speech is normal and behavior is normal. Judgment and thought content normal. Cognition and memory are normal.  Nursing note and vitals reviewed.   Vital Signs: BP 105/64 (BP Location: Left Arm)   Pulse 77   Temp 97.6 F (36.4 C) (Oral)   Resp 16   Ht 5' 2"  (1.575 m)   Wt 74.3 kg (163 lb 12.8 oz)   SpO2 100%   BMI 29.96 kg/m  Pain Scale: 0-10   Pain Score: Asleep   SpO2: SpO2: 100 % O2 Device:SpO2: 100 % O2 Flow Rate: .O2 Flow Rate (L/min): 2 L/min  IO: Intake/output summary:   Intake/Output Summary (Last 24 hours) at 08/15/2017 1447 Last data filed at 08/15/2017 1440 Gross per 24 hour  Intake 1102.5 ml  Output 4650 ml  Net -3547.5 ml    LBM: Last BM Date: 08/15/17 Baseline Weight: Weight: 68.5 kg (151 lb) Most recent weight: Weight: 74.3 kg (163 lb 12.8 oz)     Palliative Assessment/Data:PPS 40%   Time In: 1100 Time Out: 1230 Time Total: 90 min.  Greater than 50%  of this time was spent counseling and coordinating care related to the above assessment and plan.  Signed by: Alda Lea, NP-BC Palliative Medicine Team  Phone: [phone] Fax: [phone]   Please contact Palliative Medicine Team phone at [phone] for questions and concerns.  For individual provider: See Shea Evans

## 2017-08-16 ENCOUNTER — Other Ambulatory Visit: Payer: Self-pay | Admitting: Student

## 2017-08-16 DIAGNOSIS — Z7952 Long term (current) use of systemic steroids: Secondary | ICD-10-CM

## 2017-08-16 DIAGNOSIS — C221 Intrahepatic bile duct carcinoma: Secondary | ICD-10-CM

## 2017-08-16 DIAGNOSIS — A419 Sepsis, unspecified organism: Principal | ICD-10-CM

## 2017-08-16 DIAGNOSIS — L932 Other local lupus erythematosus: Secondary | ICD-10-CM

## 2017-08-16 DIAGNOSIS — R6520 Severe sepsis without septic shock: Secondary | ICD-10-CM

## 2017-08-16 LAB — GLUCOSE, CAPILLARY
GLUCOSE-CAPILLARY: 177 mg/dL — AB (ref 65–99)
GLUCOSE-CAPILLARY: 125 mg/dL — AB (ref 65–99)
GLUCOSE-CAPILLARY: 54 mg/dL — AB (ref 65–99)
Glucose-Capillary: 115 mg/dL — ABNORMAL HIGH (ref 65–99)
Glucose-Capillary: 84 mg/dL (ref 65–99)

## 2017-08-16 LAB — BASIC METABOLIC PANEL
ANION GAP: 6 (ref 5–15)
BUN: 18 mg/dL (ref 6–20)
CALCIUM: 8.1 mg/dL — AB (ref 8.9–10.3)
CO2: 33 mmol/L — ABNORMAL HIGH (ref 22–32)
CREATININE: 0.76 mg/dL (ref 0.44–1.00)
Chloride: 93 mmol/L — ABNORMAL LOW (ref 101–111)
GFR calc Af Amer: >60 mL/min (ref >60)
GLUCOSE: 105 mg/dL — AB (ref 65–99)
Potassium: 3.6 mmol/L (ref 3.5–5.1)
Sodium: 132 mmol/L — ABNORMAL LOW (ref 135–145)

## 2017-08-16 LAB — ECHOCARDIOGRAM COMPLETE
Height: 62 in
WEIGHTICAEL: 2620.83 [oz_av]

## 2017-08-16 LAB — CORTISOL: Cortisol, Plasma: 11.2 ug/dL

## 2017-08-16 MED ORDER — PREDNISONE 10 MG PO TABS
5.0000 mg | ORAL_TABLET | Freq: Every day | ORAL | Status: DC
  Administered 2017-08-18: 09:00:00 5 mg via ORAL
  Filled 2017-08-16 (×2): qty 1

## 2017-08-16 MED ORDER — POTASSIUM CHLORIDE CRYS ER 20 MEQ PO TBCR
20.0000 meq | EXTENDED_RELEASE_TABLET | Freq: Once | ORAL | Status: AC
  Administered 2017-08-16: 09:00:00 20 meq via ORAL
  Filled 2017-08-16: qty 1

## 2017-08-16 MED ORDER — IBUPROFEN 400 MG PO TABS
600.0000 mg | ORAL_TABLET | Freq: Once | ORAL | Status: AC
  Administered 2017-08-16: 19:00:00 600 mg via ORAL
  Filled 2017-08-16: qty 2

## 2017-08-16 NOTE — Care Management (Signed)
Physical therapy evaluation completed. Recommending home with home health/therapy. Discussed agencies at the bedside. Would like with go back with [facility]. Will update Floydene Flock, [facility] representative updated. Shelbie Ammons RN MSN CCM Care Management [phone]

## 2017-08-16 NOTE — Progress Notes (Signed)
Daily Progress Note   Patient Name: Regina Hood       Date: 08/16/2017 DOB: Sep 05, 1940  Age: 77 y.o. MRN#: [id number] Attending Physician: Bettey Costa, MD Primary Care Physician: Idelle Crouch, MD Admit Date: 08/12/2017  Reason for Consultation/Follow-up: Establishing goals of care  Subjective: Patient is lying in bed awake, alert and oriented x3. She denies pain or discomfort at this time. No family at bedside. Patient states that she is going home with home health PT services due to her insurance not approving her to return to facility for rehab.  Patient is somewhat concerned being she lives herself.  We discussed that rehab is only temporary and that she would also be faced with this same decision of returning home once her rehabilitation period was over, if the insurance would have approved.  She verbalized understanding and stated that was a true statement.  Her sons live close by and are her primary caregivers.  She states this was the case even prior to previously going to rehab.  Patient seems somewhat more comfortable with decision to return home with palliative services at this time.  Further discussed her request on yesterday of returning home with hospice services, at this time she would like to return with palliative services with awareness that if her condition changes or worsens hospice services are available.  Verbalized that her sons are aware of her disposition plans.   Chart reviewed and report received from bedside RN.   Length of Stay: 4  Current Medications: Scheduled Meds:  . azaTHIOprine  50 mg Oral Daily  . docusate sodium  100 mg Oral BID  . feeding supplement (ENSURE ENLIVE)  237 mL Oral BID BM  . furosemide  20 mg Intravenous Q8H  . heparin  5,000 Units  Subcutaneous Q8H  . hydroxychloroquine  200 mg Oral BID  . insulin aspart  0-5 Units Subcutaneous QHS  . insulin aspart  0-9 Units Subcutaneous TID WC  . metoprolol tartrate  75 mg Oral BID  . pantoprazole  40 mg Oral Daily  . [START ON 08/17/2017] predniSONE  5 mg Oral Q breakfast  . sodium chloride flush  10 mL Intracatheter Q0600  . venlafaxine XR  75 mg Oral TID  . vitamin B-12  1,000 mcg Oral Daily    Continuous Infusions: .  sodium chloride Stopped (08/14/17 2130)  . ceFEPime (MAXIPIME) IV Stopped (08/16/17 0953)    PRN Meds: acetaminophen **OR** acetaminophen, bisacodyl, HYDROcodone-acetaminophen, loperamide, LORazepam, ondansetron **OR** ondansetron (ZOFRAN) IV, sodium chloride flush, traMADol, traZODone  Physical Exam        Constitutional: She appears well-developed. She is cooperative.  Cardiovascular: Normal rate, regular rhythm, intact distal pulses and normal pulses.  Pulmonary/Chest: Effort normal. She has decreased breath sounds.  Abdominal: Bowel sounds are normal.  Biliary drains   Musculoskeletal:  Generalized weakness   Neurological: She is alert.  Psychiatric: She has a normal mood and affect. Her speech is normal and behavior is normal. Judgment and thought content normal. Cognition and memory are normal.  Nursing note and vitals reviewed.    Vital Signs: BP 120/66 (BP Location: Right Arm)   Pulse 77   Temp 97.7 F (36.5 C) (Oral)   Resp 18   Ht 5' 2"  (1.575 m)   Wt 74.3 kg (163 lb 12.8 oz)   SpO2 100%   BMI 29.96 kg/m  SpO2: SpO2: 100 % O2 Device: O2 Device: Nasal Cannula O2 Flow Rate: O2 Flow Rate (L/min): 2 L/min  Intake/output summary:   Intake/Output Summary (Last 24 hours) at 08/16/2017 1515 Last data filed at 08/16/2017 1038 Gross per 24 hour  Intake 1020 ml  Output -  Net 1020 ml   LBM: Last BM Date: 08/15/17 Baseline Weight: Weight: 68.5 kg (151 lb) Most recent weight: Weight: 74.3 kg (163 lb 12.8 oz)       Palliative  Assessment/Data:40%   Flowsheet Rows     Most Recent Value  Intake Tab  Referral Department  Hospitalist  Unit at Time of Referral  Med/Surg Unit  Palliative Care Primary Diagnosis  Cancer  Date Notified  08/14/17  Palliative Care Type  New Palliative care  Reason for referral  Clarify Goals of Care, End of Life Care Assistance  Date of Admission  08/12/17  Date first seen by Palliative Care  08/15/17  # of days Palliative referral response time  1 Day(s)  # of days IP prior to Palliative referral  2  Clinical Assessment  Psychosocial & Spiritual Assessment  Palliative Care Outcomes      Patient Active Problem List   Diagnosis Date Noted  . Cutaneous lupus erythematosus   . Malnutrition of moderate degree 06/09/2017  . Generalized abdominal pain   . Hyperbilirubinemia   . Hypomagnesemia   . Hypokalemia 04/27/2017  . Proteinuria 03/31/2017  . COPD exacerbation ([place]) 03/16/2017  . Dehydration 01/19/2017  . History of iron deficiency anemia 12/29/2016  . Systemic lupus erythematosus ([place]) 12/29/2016  . Cholangiocarcinoma ([place]) 12/29/2016  . History of ulcerative colitis 12/29/2016  . Goals of care, counseling/discussion 12/29/2016  . Severe sepsis ([place]) 11/13/2016  . At risk for sepsis 09/29/2016  . Red blood cell antibody positive, compatible PRBC difficult to obtain 09/22/2016  . Type 2 diabetes mellitus without complication, without long-term current use of insulin ([place]) 09/17/2016  . Liver mass, left lobe 08/23/2016  . Elevated liver enzymes 08/19/2016  . Pruritic rash 08/19/2016  . IDA (iron deficiency anemia) 07/18/2016  . Cellulitis of right upper extremity 07/05/2016  . History of rectal bleeding 07/05/2016  . Rectal bleeding 06/29/2016  . Hypertension 01/07/2016  . Anxiety and depression 09/26/2013  . DDD (degenerative disc disease) 09/26/2013  . Diabetes ([place]) 09/26/2013  . Sarcoidosis 09/26/2013  . UC (ulcerative colitis) ([place]) 09/26/2013  . Proximal  humerus fracture, left, closed, initial encounter  09/17/2011    Palliative Care Assessment & Plan   Patient Profile: 77 y.o. female  admitted on 08/12/2017 from [facility] with altered mental status and fever. She has a past medical history of arthritis, hypertension,  interstitial lung disease (COPD), chronic respiratory failure on 2 L continuous oxygen at home, SLE, depression, diabetes, GERD, ulcerative colitis, and cholangiocarcinoma with biliary drains in place. On admission patient was unable to provide history due to AMS. She was was noted to have productive cough, shortness of breath and wheezing according to facility. She was just discharged from our hospital 2 days ago;due to possible sepsis and acute on chronic respiratory failure;she was treated and discharged on Cipro and Flagyl p.o. During her ED course she was tachycardic(HR 120s) and tachypneic (RR 27).Temperature was 100.1.Blood test were unremarkable for elevated lactic acid at 3.8 and troponin level at 0.05. Potassium level is 3.3. WBCs within normal limits. EKG is noted with sinus tachycardia but no acute ischemic changes. Chest x-ray,  showed bilateral pulmonary infiltrates and pulmonary vascular congestion. Since admission patient receiving antibiotics (cefepime and flagyl) for sepsis due to HCAP and electrolyte replacement. Palliative Medicine consulted for goals of care discussion.   Recommendations/Plan:  Continue DNR/DNI at patient's request  Continue to treat the treatable while hospitalized at patient's request.  Agree with and for patient to return home with home health PT and palliative services, which were currently in place prior to admission.  Plans for services were discussed with Santiago Glad, RN [facility]).   Palliative Medicine team will continue to support patient, patient's family, and medical team during hospitalization.  Goals of Care and Additional Recommendations:  Limitations on Scope of  Treatment: Full Scope Treatment continue to treat the treatable.   Code Status:    Code Status Orders  (From admission, onward)        Start     Ordered   08/12/17 2256  Do not attempt resuscitation (DNR)  Continuous    Question Answer Comment  In the event of cardiac or respiratory ARREST Do not call a "code blue"   In the event of cardiac or respiratory ARREST Do not perform Intubation, CPR, defibrillation or ACLS   In the event of cardiac or respiratory ARREST Use medication by any route, position, wound care, and other measures to relive pain and suffering. May use oxygen, suction and manual treatment of airway obstruction as needed for comfort.      08/12/17 2256       09/17/2011 1443 09/19/2011 1624 Full Code [id number]  Prudencio Pair Rudder, RN Inpatient    Advance Directive Documentation     Most Recent Value  Type of Advance Directive  Healthcare Power of Attorney, Out of facility DNR (pink MOST or yellow form)  Pre-existing out of facility DNR order (yellow form or pink MOST form)  Yellow form placed in chart (order not valid for inpatient use)  "MOST" Form in Place?  -     Prognosis:   Unable to determine-guarded to poor in the setting of cholangiocarcinoma, poor p.o. intake, decreased mobility, COPD, chronic home use of oxygen, generalized weakness, recurrent pneumonia/sepsis, and SLE.   Discharge Planning:  Home with Home Health and palliative services.   Care plan was discussed with patient, Hassan Rowan, CM, and bedside RN.   Thank you for allowing the Palliative Medicine Team to assist in the care of this patient.   Total Time 35 min.  Prolonged Time Billed No      Greater than  50%  of this time was spent counseling and coordinating care related to the above assessment and plan.  Alda Lea, NP  Please contact Palliative Medicine Team phone at [phone] for questions and concerns.

## 2017-08-16 NOTE — Progress Notes (Signed)
Health Team denied SNF. Plan is for patient to D/C home with home health PT and palliative care. Patient is aware of above. Clinical Social Worker (CSW) contacted patient's son Elta Guadeloupe and made him aware of above. Per Elta Guadeloupe he will transport patient home when she is ready for discharge. MD and RN case manager aware of above. Please reconsult if future social work needs arise. CSW signing off.   [employer], LCSW [phone]

## 2017-08-16 NOTE — Progress Notes (Signed)
Physical Therapy Treatment Patient Details Name: Regina Hood MRN: [id number] DOB: 12-10-1940 Today's Date: 08/16/2017    History of Present Illness 77 yo female with onset of pulm edema and PNA with metabolic encephalopathy was admitted yesterday.  Has flat troponins, cleared for MI, noted cardiac enlargement and has malnutrition. PMHx:  Cholangiocarcinoma with biliary drains, sarcoidosis, SLE, DM,  COPD exacerbation, colitis, rectal bleeding,     PT Comments    Pt in bed, ready to walk.  Stating she was declined rehab and was going home today. Pt was able to transition out of bed and ambulate around nursing unit with walker and min guard/supervision. Overall tolerated well with improved mobility skills.  Pt will need RW and 3-in-1 commode for discharge home.  Discussed with Hassan Rowan from care management.  Pt wore O2 at home prior and stated she was comfortable with it.  98% O2 sats, 88/89 HR after gait with O2 at 2 lpm.  Will change discharge recommendations to reflect current mobility level.   Follow Up Recommendations  Home health PT     Equipment Recommendations  Rolling walker with 5" wheels;3in1 (PT)    Recommendations for Other Services       Precautions / Restrictions Precautions Precautions: Fall Restrictions Weight Bearing Restrictions: No    Mobility  Bed Mobility Overal bed mobility: Modified Independent Bed Mobility: Supine to Sit     Supine to sit: Modified independent (Device/Increase time)        Transfers Overall transfer level: Modified independent Equipment used: None Transfers: Sit to/from Stand Sit to Stand: Modified independent (Device/Increase time)            Ambulation/Gait Ambulation/Gait assistance: Supervision Ambulation Distance (Feet): 180 Feet Assistive device: Rolling walker (2 wheeled) Gait Pattern/deviations: Step-through pattern Gait velocity: reduced       Stairs             Wheelchair Mobility    Modified  Rankin (Stroke Patients Only)       Balance Overall balance assessment: Modified Independent Sitting-balance support: Feet supported Sitting balance-Leahy Scale: Good     Standing balance support: Bilateral upper extremity supported Standing balance-Leahy Scale: Fair                              Cognition Arousal/Alertness: Awake/alert Behavior During Therapy: WFL for tasks assessed/performed Overall Cognitive Status: Within Functional Limits for tasks assessed                                        Exercises      General Comments        Pertinent Vitals/Pain Pain Assessment: No/denies pain    Home Living                      Prior Function            PT Goals (current goals can now be found in the care plan section) Progress towards PT goals: Progressing toward goals    Frequency    Min 2X/week      PT Plan Discharge plan needs to be updated    Co-evaluation              AM-PAC PT "6 Clicks" Daily Activity  Outcome Measure  Difficulty turning over in bed (including adjusting bedclothes, sheets and blankets)?: None  Difficulty moving from lying on back to sitting on the side of the bed? : None Difficulty sitting down on and standing up from a chair with arms (e.g., wheelchair, bedside commode, etc,.)?: None Help needed moving to and from a bed to chair (including a wheelchair)?: None Help needed walking in hospital room?: A Little Help needed climbing 3-5 steps with a railing? : A Little 6 Click Score: 22    End of Session Equipment Utilized During Treatment: Oxygen Activity Tolerance: Patient tolerated treatment well Patient left: in chair;with chair alarm set;with call bell/phone within reach Nurse Communication: Other (comment)       Time: 6015-6153 PT Time Calculation (min) (ACUTE ONLY): 13 min  Charges:  $Gait Training: 8-22 mins                    G Codes:       Chesley Noon,  PTA 08/16/17, 9:37 AM

## 2017-08-16 NOTE — Progress Notes (Signed)
D/w Dr Tasia Catchings.  She feels that patient may be developing fevers due to biliary drains.  She is recommending IR replaced these drains.  Order has been written.  She is also recommending restarting patient on prednisone 5 mg daily since her cortisol level is within normal limits.

## 2017-08-16 NOTE — Progress Notes (Signed)
[facility] NAME: Regina Hood    MR#:  [id number]  DATE OF BIRTH:  1940/11/26  SUBJECTIVE:   Patient feeling better this morning.  Ambulated with physical therapy and did well. No fevers overnight REVIEW OF SYSTEMS:    Review of Systems  Constitutional: Negative for fever, chills weight loss this morning HENT: Negative for ear pain, nosebleeds, congestion, facial swelling, rhinorrhea, neck pain, neck stiffness and ear discharge.   Respiratory: Negative for cough, shortness of breath, wheezing  Cardiovascular: Negative for chest pain, palpitations and leg swelling.  Gastrointestinal: Negative for heartburn, abdominal pain, vomiting, diarrhea or consitpation Genitourinary: Negative for dysuria, urgency, frequency, hematuria Musculoskeletal: Negative for back pain or joint pain Neurological: Negative for dizziness, seizures, syncope, focal weakness,  numbness and headaches.  Hematological: Does not bruise/bleed easily.  Psychiatric/Behavioral: Negative for hallucinations, confusion, dysphoric mood    Tolerating Diet: yes      DRUG ALLERGIES:   Allergies  Allergen Reactions  . Penicillins Nausea Only    VITALS:  Blood pressure 126/71, pulse 90, temperature 97.7 F (36.5 C), temperature source Oral, resp. rate 18, height 5' 2"  (1.575 m), weight 74.3 kg (163 lb 12.8 oz), SpO2 100 %.  PHYSICAL EXAMINATION:  Constitutional: Appears well-developed and well-nourished. No distress. HENT: Normocephalic. Marland Kitchen Oropharynx is clear and moist.  Eyes: Conjunctivae and EOM are normal. PERRLA, no scleral icterus.  Neck: Normal ROM. Neck supple. No JVD. No tracheal deviation. CVS: RRR, S1/S2 +, no murmurs, no gallops, no carotid bruit.  Pulmonary: Effort and breath sounds normal, no stridor, rhonchi, wheezes, rales.  Abdominal: Soft. BS +, nondistended abdomen with 2 biliary drains present Biliary drain is clean and intact Musculoskeletal:  Normal range of motion. No edema and no tenderness.  Neuro: Alert. CN 2-12 grossly intact. No focal deficits. Skin: Skin is warm and dry. No rash noted. Psychiatric: Normal mood and affect.      LABORATORY PANEL:   CBC Recent Labs  Lab 08/15/17 0611  WBC 8.4  HGB 8.3*  HCT 24.4*  PLT 183   ------------------------------------------------------------------------------------------------------------------  Chemistries  Recent Labs  Lab 08/14/17 2047  08/16/17 0429  NA 133*   < > 132*  K 3.7   < > 3.6  CL 98*   < > 93*  CO2 27   < > 33*  GLUCOSE 193*   < > 105*  BUN 20   < > 18  CREATININE 0.66   < > 0.76  CALCIUM 8.3*   < > 8.1*  AST 41  --   --   ALT 18  --   --   ALKPHOS 274*  --   --   BILITOT 0.7  --   --    < > = values in this interval not displayed.   ------------------------------------------------------------------------------------------------------------------  Cardiac Enzymes Recent Labs  Lab 08/12/17 1904 08/13/17 0607  TROPONINI 0.05* 0.03*   ------------------------------------------------------------------------------------------------------------------  RADIOLOGY:  Dg Chest 1 View  Result Date: 08/14/2017 CLINICAL DATA:  Hypoxia EXAM: CHEST  1 VIEW COMPARISON:  08/12/2017, CT 03/16/2017 FINDINGS: Stable cardiomegaly and aortic atherosclerosis. Port catheter tip terminates in the distal SVC. Chronic coarsened interstitial lung markings and atelectasis is redemonstrated. Calcified pleural plaque at the right lung base. Chain sutures project over the right lung base as well. Superimposed interstitial edema suspected. Trace bilateral pleural effusions are present. Tiny nodular densities project over the left upper lobe and are believed to represent tiny foci pleural thickening  based on prior CT. Small pulmonary nodules are not entirely excluded. Left humerus plate and screw fixation. IMPRESSION: 1. Cardiomegaly with aortic atherosclerosis. Diffuse  coarsened interstitial lung markings and atelectasis with probable diffuse interstitial edema. Trace bilateral pleural effusions. 2. Calcified pleural plaque at the right lung base. 3. Tiny nodular densities overlying the left upper lobe, nonspecific. Pulmonary nodules versus focal pleural plaque. Electronically Signed   By: Ashley Royalty M.D.   On: 08/14/2017 22:23   Dg Chest 2 View  Result Date: 08/15/2017 CLINICAL DATA:  Chronic respiratory failure, interstitial lung disease. Productive cough, shortness of breath, wheezing EXAM: CHEST - 2 VIEW COMPARISON:  Chest x-ray 08/14/2017.  Chest CT 08/15/2017. FINDINGS: Severe chronic interstitial lung disease and fibrosis changes. Possible superimposed edema. Heart is borderline in size. Right Port-A-Cath remains in place, unchanged. Biliary drains noted in the right abdomen. IMPRESSION: Stable chronic interstitial lung disease/fibrosis. Possible superimposed edema, stable. Electronically Signed   By: Rolm Baptise M.D.   On: 08/15/2017 07:59   Ct Chest W Contrast  Result Date: 08/15/2017 CLINICAL DATA:  Sepsis. Fever. Tachycardia. Tachypnea. Cholangiocarcinoma with biliary tubes. History of ulcerative colitis, diverticulitis, sarcoidosis. EXAM: CT CHEST, ABDOMEN, AND PELVIS WITH CONTRAST TECHNIQUE: Multidetector CT imaging of the chest, abdomen and pelvis was performed following the standard protocol during bolus administration of intravenous contrast. CONTRAST:  56m ISOVUE-370 IOPAMIDOL (ISOVUE-370) INJECTION 76% COMPARISON:  03/16/2017 CT chest.  08/08/2017 CT abdomen and pelvis. FINDINGS: CT CHEST FINDINGS Cardiovascular: Normal caliber thoracic aorta with scattered calcification. Mild cardiac enlargement. Calcification in the mitral and aortic valves. No pericardial effusion. Central venous catheter tip in the SVC. Mediastinum/Nodes: Diffuse mediastinal lymphadenopathy. Largest pretracheal nodes measure about 13 mm short axis dimension. Aortopulmonic window  node measures 13 mm short axis dimension. Similar to previous study. Esophagus is decompressed. Retrocrural lymphadenopathy. Diffuse multinodular enlargement of the thyroid gland with right thyroid calcifications. Likely multinodular goiter. Lungs/Pleura: Diffuse mosaic attenuation pattern in the lungs with patchy peripheral fibrosis and coarse fibrosis in the lung bases with honeycomb change and bronchiectasis. Changes likely represent chronic fibrosis and bronchiectasis with superimposed edema. Calcified pleural plaques. No pleural effusions. No pneumothorax. Airways are patent. Musculoskeletal: Degenerative changes in the spine. Vertebral hemangiomas. No destructive bone lesions. CT ABDOMEN PELVIS FINDINGS Hepatobiliary: Internal external biliary drainage catheter is in place with left and right insertion drainage catheters. Distal pigtails in the duodenum. No bile duct dilatation. Gallbladder is contracted with gas in the gallbladder consistent with instrumentation. Postoperative partial left hepatectomy. Pancreas: Unremarkable. No pancreatic ductal dilatation or surrounding inflammatory changes. Spleen: Normal in size without focal abnormality. Adrenals/Urinary Tract: Adrenal glands are unremarkable. Kidneys are normal, without renal calculi, focal lesion, or hydronephrosis. Bladder is unremarkable. Stomach/Bowel: Stomach, small bowel, and colon are not abnormally distended. Contrast material is present in the cecum suggesting no evidence of small bowel obstruction. Scattered stool throughout the colon. Diverticulosis of the colon. No inflammatory changes. Vascular/Lymphatic: Calcification of the aorta without aneurysm. Mild prominence of retroperitoneal lymph nodes without pathologic enlargement. Reproductive: Status post hysterectomy. No adnexal masses. Other: There is a small amount of free fluid in the pericolic gutters and in the pelvis, new since prior study. No loculated fluid collections to suggest  abscess. Edema in the subcutaneous fatty tissues. No free air. Musculoskeletal: Degenerative changes in the spine. No destructive bone lesions. IMPRESSION: 1. Chronic fibrosis in the lungs with basilar honeycomb changes and bronchiectasis. 2. Superimposed edema in the lungs. 3. Mediastinal lymphadenopathy similar to previous study. 4. Internal external  biliary drainage catheters remain in place. No bile duct dilatation. Pneumobilia consistent with procedures. 5. Small amount of free fluid in the pericolic gutters and in the pelvis is new since previous study. No loculated collection to suggest abscess. 6. Retrocrural and retroperitoneal lymphadenopathy is unchanged. Electronically Signed   By: Lucienne Capers M.D.   On: 08/15/2017 05:15   Ct Abdomen Pelvis W Contrast  Result Date: 08/15/2017 CLINICAL DATA:  Sepsis. Fever. Tachycardia. Tachypnea. Cholangiocarcinoma with biliary tubes. History of ulcerative colitis, diverticulitis, sarcoidosis. EXAM: CT CHEST, ABDOMEN, AND PELVIS WITH CONTRAST TECHNIQUE: Multidetector CT imaging of the chest, abdomen and pelvis was performed following the standard protocol during bolus administration of intravenous contrast. CONTRAST:  70m ISOVUE-370 IOPAMIDOL (ISOVUE-370) INJECTION 76% COMPARISON:  03/16/2017 CT chest.  08/08/2017 CT abdomen and pelvis. FINDINGS: CT CHEST FINDINGS Cardiovascular: Normal caliber thoracic aorta with scattered calcification. Mild cardiac enlargement. Calcification in the mitral and aortic valves. No pericardial effusion. Central venous catheter tip in the SVC. Mediastinum/Nodes: Diffuse mediastinal lymphadenopathy. Largest pretracheal nodes measure about 13 mm short axis dimension. Aortopulmonic window node measures 13 mm short axis dimension. Similar to previous study. Esophagus is decompressed. Retrocrural lymphadenopathy. Diffuse multinodular enlargement of the thyroid gland with right thyroid calcifications. Likely multinodular goiter.  Lungs/Pleura: Diffuse mosaic attenuation pattern in the lungs with patchy peripheral fibrosis and coarse fibrosis in the lung bases with honeycomb change and bronchiectasis. Changes likely represent chronic fibrosis and bronchiectasis with superimposed edema. Calcified pleural plaques. No pleural effusions. No pneumothorax. Airways are patent. Musculoskeletal: Degenerative changes in the spine. Vertebral hemangiomas. No destructive bone lesions. CT ABDOMEN PELVIS FINDINGS Hepatobiliary: Internal external biliary drainage catheter is in place with left and right insertion drainage catheters. Distal pigtails in the duodenum. No bile duct dilatation. Gallbladder is contracted with gas in the gallbladder consistent with instrumentation. Postoperative partial left hepatectomy. Pancreas: Unremarkable. No pancreatic ductal dilatation or surrounding inflammatory changes. Spleen: Normal in size without focal abnormality. Adrenals/Urinary Tract: Adrenal glands are unremarkable. Kidneys are normal, without renal calculi, focal lesion, or hydronephrosis. Bladder is unremarkable. Stomach/Bowel: Stomach, small bowel, and colon are not abnormally distended. Contrast material is present in the cecum suggesting no evidence of small bowel obstruction. Scattered stool throughout the colon. Diverticulosis of the colon. No inflammatory changes. Vascular/Lymphatic: Calcification of the aorta without aneurysm. Mild prominence of retroperitoneal lymph nodes without pathologic enlargement. Reproductive: Status post hysterectomy. No adnexal masses. Other: There is a small amount of free fluid in the pericolic gutters and in the pelvis, new since prior study. No loculated fluid collections to suggest abscess. Edema in the subcutaneous fatty tissues. No free air. Musculoskeletal: Degenerative changes in the spine. No destructive bone lesions. IMPRESSION: 1. Chronic fibrosis in the lungs with basilar honeycomb changes and bronchiectasis. 2.  Superimposed edema in the lungs. 3. Mediastinal lymphadenopathy similar to previous study. 4. Internal external biliary drainage catheters remain in place. No bile duct dilatation. Pneumobilia consistent with procedures. 5. Small amount of free fluid in the pericolic gutters and in the pelvis is new since previous study. No loculated collection to suggest abscess. 6. Retrocrural and retroperitoneal lymphadenopathy is unchanged. Electronically Signed   By: WLucienne CapersM.D.   On: 08/15/2017 05:15     ASSESSMENT AND PLAN:  77year old female with cholangiocarcinoma status post biliary drain, interstitial lung disease and chronic hypoxic respiratory failure on 2 L of oxygen who presents from [facility] to altered mental status and fever.  1.  Acute metabolic encephalopathy in the setting of  sepsis due to HCAP Patient is at her baseline.  2.  Sepsis due to HCAP with chronic hypoxic respiratory failure on 2 liters of oxygen: Patient presented with tachycardia, tachypnea and fever of 100.1 He had another fever last night.  CT of the abdomen and pelvis were essentially unremarkable.  Chest x-ray also essentially unremarkable.  Patient was seen in consultation by Ola Spurr given recent fever 2 days ago. Accommodations were to try to obtain sputum culture.  A.m. cortisol level was 11.2 which is within normal limits.  He is recommending to discontinue Flagyl and add azithromycin for atypical coverage.  She will continue on cefepime.   3.  HCAP: Continue cefepime and azithromycin.  MRSA PCR is negative    4.  Elevated troponin: Patient has ruled out for ACS.  5.  Hyponatremia with chronic hyponatremia: Sodium level has been relatively stable 6.  Hypokalemia: This will be repleted. 7.  Anemia of chronic disease: Monitor hemoglobin  8.  Cholangiocarcinoma status post biliary drains: Patient was recently discharged with Flagyl and Cipro as recommended by ID consultant.   Continue  cefepime. Obtain oncology consultation  9.  History of sarcoidosis/pulmonary fibrosis: Continue Plaquenil  10.  Diabetes: Continue sliding scale and ADA diet  11.  SLE: Patient will continue on Imuran 12.  Essential hypertension: Continue metoprolol    D/w dr fitzgerald  Patient will be discharged home with home health when medically stable.  Management plans discussed with the patient and she is in agreement.  CODE STATUS: DNR  TOTAL TIME TAKING CARE OF THIS PATIENT: 27 minutes.     POSSIBLE D/C in the next 1 to 2 days DEPENDING ON CLINICAL CONDITION.   Shon Indelicato M.D on 08/16/2017 at 11:52 AM  Between 7am to 6pm - Pager - [phone] After 6pm go to [email or web address] - password [email or web address] [facility]  Office  [phone]  CC: Primary care physician; Idelle Crouch, MD  Note: This dictation was prepared with Dragon dictation along with smaller phrase technology. Any transcriptional errors that result from this process are unintentional.

## 2017-08-16 NOTE — Consult Note (Signed)
Hematology/Oncology Consult note [facility] [phone] [phone]  Patient Care Team: Idelle Crouch, MD as PCP - General (Internal Medicine) Vern Claude, [place] as [place] [facility]   Name of the patient: Regina Hood  [id number]  01-Feb-1941   Date of visit: 08/16/17 REASON FOR COSULTATION:  cholangiocarcinoma History of presenting illness-  This is a 77 year old female known to me for management of cholangiocarcinoma, last chemotherapy in December who was sent to ER for evaluation of altered mental status, malaise, chills.  She was recently hospitalized for sepsis and was treated with IV antibiotic she has had extensive infectious etiology work-up and no source of infection was found.  Patient was sent to Rehab facility and was readmitted due to recurrent sepsis.  On admission patient had a temperature of 103, and white count of 13.  Patient is on immunosuppressant for cutaneous lupus flare. Today patient feels much better.  She is afebrile today.  Denies any shortness of breath, chest pain, abdominal pain.  Mental status have improved as well.  Review of systems- Review of Systems  Constitutional: Positive for fever and malaise/fatigue. Negative for chills and weight loss.  HENT: Negative for congestion, ear discharge, ear pain, nosebleeds, sinus pain and sore throat.   Eyes: Positive for blurred vision. Negative for double vision and photophobia.  Respiratory: Negative for cough, shortness of breath and wheezing.   Cardiovascular: Negative for chest pain, palpitations, orthopnea, claudication and leg swelling.  Gastrointestinal: Negative for abdominal pain, diarrhea, heartburn, melena, nausea and vomiting.  Genitourinary: Negative for dysuria, flank pain, frequency and hematuria.  Musculoskeletal: Negative for back pain, myalgias and neck pain.  Skin: Negative for itching and rash.  Neurological: Negative for  tingling, tremors, focal weakness and weakness.  Endo/Heme/Allergies: Negative for environmental allergies. Does not bruise/bleed easily.  Psychiatric/Behavioral: Negative for depression, hallucinations, substance abuse and suicidal ideas. The patient is not nervous/anxious.     Allergies  Allergen Reactions  . Penicillins Nausea Only    Patient Active Problem List   Diagnosis Date Noted  . Malnutrition of moderate degree 06/09/2017  . Generalized abdominal pain   . Hyperbilirubinemia   . Hypomagnesemia   . Hypokalemia 04/27/2017  . Proteinuria 03/31/2017  . COPD exacerbation ([place]) 03/16/2017  . Dehydration 01/19/2017  . History of iron deficiency anemia 12/29/2016  . Systemic lupus erythematosus ([place]) 12/29/2016  . Cholangiocarcinoma ([place]) 12/29/2016  . History of ulcerative colitis 12/29/2016  . Goals of care, counseling/discussion 12/29/2016  . Sepsis ([place]) 11/13/2016  . At risk for sepsis 09/29/2016  . Red blood cell antibody positive, compatible PRBC difficult to obtain 09/22/2016  . Type 2 diabetes mellitus without complication, without long-term current use of insulin ([place]) 09/17/2016  . Liver mass, left lobe 08/23/2016  . Elevated liver enzymes 08/19/2016  . Pruritic rash 08/19/2016  . IDA (iron deficiency anemia) 07/18/2016  . Cellulitis of right upper extremity 07/05/2016  . History of rectal bleeding 07/05/2016  . Rectal bleeding 06/29/2016  . Hypertension 01/07/2016  . Anxiety and depression 09/26/2013  . DDD (degenerative disc disease) 09/26/2013  . Diabetes ([place]) 09/26/2013  . Sarcoidosis 09/26/2013  . UC (ulcerative colitis) ([place]) 09/26/2013  . Proximal humerus fracture, left, closed, initial encounter 09/17/2011     Past Medical History:  Diagnosis Date  . Arthritis   . Cancer ([place])   . Cellulitis   . Collagen vascular disease ([place])   . COPD (chronic obstructive pulmonary disease) ([place])   . DDD (  degenerative disc disease), lumbar   . Depression     . Diabetes mellitus   . Diverticulitis   . GERD (gastroesophageal reflux disease)   . GI bleed   . Headache(784.0)   . Hypertension   . Lupus ([place])   . Ulcerative colitis [facility])      Past Surgical History:  Procedure Laterality Date  . ABDOMINAL HYSTERECTOMY    . BREAST BIOPSY Right   . COLONOSCOPY    . COLONOSCOPY WITH PROPOFOL N/A 08/18/2016   Procedure: COLONOSCOPY WITH PROPOFOL;  Surgeon: Manya Silvas, MD;  Location: [facility] ENDOSCOPY;  Service: Endoscopy;  Laterality: N/A;  . ESOPHAGOGASTRODUODENOSCOPY (EGD) WITH PROPOFOL N/A 08/18/2016   Procedure: ESOPHAGOGASTRODUODENOSCOPY (EGD) WITH PROPOFOL;  Surgeon: Manya Silvas, MD;  Location: [facility] ENDOSCOPY;  Service: Endoscopy;  Laterality: N/A;  . IR BILIARY DRAIN PLACEMENT WITH CHOLANGIOGRAM  06/08/2017  . ORIF HUMERUS FRACTURE  09/17/2011   Procedure: OPEN REDUCTION INTERNAL FIXATION (ORIF) PROXIMAL HUMERUS FRACTURE;  Surgeon: Augustin Schooling, MD;  Location: [place];  Service: Orthopedics;  Laterality: Left;    Social History   Socioeconomic History  . Marital status: Widowed    Spouse name: Not on file  . Number of children: 2  . Years of education: Not on file  . Highest education level: Not on file  Occupational History  . Not on file  Social Needs  . Financial resource strain: Not hard at all  . Food insecurity:    Worry: Never true    Inability: Never true  . Transportation needs:    Medical: No    Non-medical: No  Tobacco Use  . Smoking status: Former Smoker    Packs/day: 1.00    Years: 10.00    Pack years: 10.00    Types: Cigarettes    Last attempt to quit: 12/30/1998    Years since quitting: 18.6  . Smokeless tobacco: Never Used  . Tobacco comment: quit smoking in 2003  Substance and Sexual Activity  . Alcohol use: No  . Drug use: No  . Sexual activity: Not Currently  Lifestyle  . Physical activity:    Days per week: 0 days    Minutes per session: 0 min  . Stress: Only a little  Relationships  .  Social connections:    Talks on phone: Once a week    Gets together: Once a week    Attends religious service: More than 4 times per year    Active member of club or organization: Yes    Attends meetings of clubs or organizations: 1 to 4 times per year    Relationship status: Widowed  . Intimate partner violence:    Fear of current or ex partner: No    Emotionally abused: No    Physically abused: No    Forced sexual activity: No  Other Topics Concern  . Not on file  Social History Narrative   Live alone, independent     Family History  Problem Relation Age of Onset  . Breast cancer Mother 12  . COPD Father   . Prostate cancer Brother      Current Facility-Administered Medications:  .  0.9 %  sodium chloride infusion, , Intravenous, Continuous, Vaughan Basta, MD, Stopped at 08/14/17 2130 .  acetaminophen (TYLENOL) tablet 650 mg, 650 mg, Oral, Q6H PRN, 650 mg at 08/14/17 2005 **OR** acetaminophen (TYLENOL) suppository 650 mg, 650 mg, Rectal, Q6H PRN, Amelia Jo, MD .  azaTHIOprine (IMURAN) tablet 50 mg, 50 mg, Oral,  Daily, Amelia Jo, MD, 50 mg at 08/16/17 4665 .  bisacodyl (DULCOLAX) EC tablet 5 mg, 5 mg, Oral, Daily PRN, Amelia Jo, MD .  ceFEPIme (MAXIPIME) 1 g in sodium chloride 0.9 % 100 mL IVPB, 1 g, Intravenous, Q12H, Amelia Jo, MD, Last Rate: 200 mL/hr at 08/16/17 0923, 1 g at 08/16/17 0923 .  docusate sodium (COLACE) capsule 100 mg, 100 mg, Oral, BID, Amelia Jo, MD, 100 mg at 08/16/17 9935 .  feeding supplement (ENSURE ENLIVE) (ENSURE ENLIVE) liquid 237 mL, 237 mL, Oral, BID BM, Mody, Sital, MD, 237 mL at 08/16/17 0922 .  furosemide (LASIX) injection 20 mg, 20 mg, Intravenous, Q8H, Vaughan Basta, MD, 20 mg at 08/16/17 0600 .  heparin injection 5,000 Units, 5,000 Units, Subcutaneous, Q8H, Amelia Jo, MD, 5,000 Units at 08/16/17 0559 .  HYDROcodone-acetaminophen (NORCO/VICODIN) 5-325 MG per tablet 1-2 tablet, 1-2 tablet, Oral, Q4H PRN,  Amelia Jo, MD, 2 tablet at 08/16/17 0559 .  hydroxychloroquine (PLAQUENIL) tablet 200 mg, 200 mg, Oral, BID, Amelia Jo, MD, 200 mg at 08/16/17 7017 .  insulin aspart (novoLOG) injection 0-5 Units, 0-5 Units, Subcutaneous, QHS, Amelia Jo, MD, 2 Units at 08/13/17 2121 .  insulin aspart (novoLOG) injection 0-9 Units, 0-9 Units, Subcutaneous, TID WC, Amelia Jo, MD, 2 Units at 08/15/17 1215 .  loperamide (IMODIUM) capsule 2 mg, 2 mg, Oral, PRN, Bettey Costa, MD, 2 mg at 08/13/17 2129 .  LORazepam (ATIVAN) injection 0.5 mg, 0.5 mg, Intravenous, QHS PRN, Amelia Jo, MD, 0.5 mg at 08/15/17 2333 .  metoprolol tartrate (LOPRESSOR) tablet 75 mg, 75 mg, Oral, BID, Amelia Jo, MD, 75 mg at 08/16/17 7939 .  ondansetron (ZOFRAN) tablet 4 mg, 4 mg, Oral, Q6H PRN **OR** ondansetron (ZOFRAN) injection 4 mg, 4 mg, Intravenous, Q6H PRN, Amelia Jo, MD .  pantoprazole (PROTONIX) EC tablet 40 mg, 40 mg, Oral, Daily, Amelia Jo, MD, 40 mg at 08/16/17 0300 .  sodium chloride flush (NS) 0.9 % injection 10 mL, 10 mL, Intracatheter, Q0600, Amelia Jo, MD, 10 mL at 08/16/17 0601 .  sodium chloride flush (NS) 0.9 % injection 10-40 mL, 10-40 mL, Intracatheter, PRN, Mody, Sital, MD .  traMADol (ULTRAM) tablet 50 mg, 50 mg, Oral, Q6H PRN, Benjie Karvonen, Sital, MD, 50 mg at 08/15/17 1739 .  traZODone (DESYREL) tablet 25 mg, 25 mg, Oral, QHS PRN, Amelia Jo, MD, 25 mg at 08/13/17 2129 .  venlafaxine XR (EFFEXOR-XR) 24 hr capsule 75 mg, 75 mg, Oral, TID, Mody, Sital, MD, 75 mg at 08/16/17 0922 .  vitamin B-12 (CYANOCOBALAMIN) tablet 1,000 mcg, 1,000 mcg, Oral, Daily, Amelia Jo, MD, 1,000 mcg at 08/16/17 9233  Facility-Administered Medications Ordered in Other Encounters:  .  0.9 %  sodium chloride infusion, , Intravenous, PRN, Earlie Server, MD, Stopped at 04/27/17 1400 .  heparin lock flush 100 unit/mL, 500 Units, Intravenous, Once, Earlie Server, MD   Physical exam:  Vitals:   08/15/17 1940 08/15/17 2053  08/16/17 0405 08/16/17 0545  BP: 126/65 131/63 99/61 126/71  Pulse: (!) 106 (!) 116 84 90  Resp: 18  18   Temp: 98.5 F (36.9 C) 98.9 F (37.2 C) 97.7 F (36.5 C)   TempSrc: Oral Oral Oral   SpO2: 98%  100%   Weight:      Height:       Physical Exam  Constitutional: She is oriented to person, place, and time and well-developed, well-nourished, and in no distress. No distress.  HENT:  Head: Normocephalic and atraumatic.  Nose: Nose  normal.  Mouth/Throat: Oropharynx is clear and moist. No oropharyngeal exudate.  Eyes: Pupils are equal, round, and reactive to light. EOM are normal. Left eye exhibits no discharge. No scleral icterus.  Neck: Normal range of motion. Neck supple. No JVD present.  Cardiovascular: Normal rate, regular rhythm and normal heart sounds.  No murmur heard. Pulmonary/Chest: Effort normal. No respiratory distress. She has no wheezes. She has no rales.  Chronic bilateral bibasilar Rales  Abdominal: Soft. She exhibits no distension. There is no tenderness. There is no rebound.  She has 2 bile duct drain tubes  Musculoskeletal: Normal range of motion. She exhibits no edema or tenderness.  Lymphadenopathy:    She has no cervical adenopathy.  Neurological: She is alert and oriented to person, place, and time. No cranial nerve deficit. She exhibits normal muscle tone. Coordination normal.  Skin: Skin is warm and dry. She is not diaphoretic.  papular erythematous rash  No erythema or discharge around Med[employer]  Psychiatric: Affect and judgment normal.      CMP Latest Ref Rng & Units 08/16/2017  Glucose 65 - 99 mg/dL 105(H)  BUN 6 - 20 mg/dL 18  Creatinine 0.44 - 1.00 mg/dL 0.76  Sodium 135 - 145 mmol/L 132(L)  Potassium 3.5 - 5.1 mmol/L 3.6  Chloride 101 - 111 mmol/L 93(L)  CO2 22 - 32 mmol/L 33(H)  Calcium 8.9 - 10.3 mg/dL 8.1(L)  Total Protein 6.5 - 8.1 g/dL -  Total Bilirubin 0.3 - 1.2 mg/dL -  Alkaline Phos 38 - 126 U/L -  AST 15 - 41 U/L -  ALT 14 - 54  U/L -   CBC Latest Ref Rng & Units 08/15/2017  WBC 3.6 - 11.0 K/uL 8.4  Hemoglobin 12.0 - 16.0 g/dL 8.3(L)  Hematocrit 35.0 - 47.0 % 24.4(L)  Platelets 150 - 440 K/uL 183   Dg Chest 1 View  Result Date: 08/14/2017 CLINICAL DATA:  Hypoxia EXAM: CHEST  1 VIEW COMPARISON:  08/12/2017, CT 03/16/2017 FINDINGS: Stable cardiomegaly and aortic atherosclerosis. Port catheter tip terminates in the distal SVC. Chronic coarsened interstitial lung markings and atelectasis is redemonstrated. Calcified pleural plaque at the right lung base. Chain sutures project over the right lung base as well. Superimposed interstitial edema suspected. Trace bilateral pleural effusions are present. Tiny nodular densities project over the left upper lobe and are believed to represent tiny foci pleural thickening based on prior CT. Small pulmonary nodules are not entirely excluded. Left humerus plate and screw fixation. IMPRESSION: 1. Cardiomegaly with aortic atherosclerosis. Diffuse coarsened interstitial lung markings and atelectasis with probable diffuse interstitial edema. Trace bilateral pleural effusions. 2. Calcified pleural plaque at the right lung base. 3. Tiny nodular densities overlying the left upper lobe, nonspecific. Pulmonary nodules versus focal pleural plaque. Electronically Signed   By: Ashley Royalty M.D.   On: 08/14/2017 22:23   Dg Chest 2 View  Result Date: 08/15/2017 CLINICAL DATA:  Chronic respiratory failure, interstitial lung disease. Productive cough, shortness of breath, wheezing EXAM: CHEST - 2 VIEW COMPARISON:  Chest x-ray 08/14/2017.  Chest CT 08/15/2017. FINDINGS: Severe chronic interstitial lung disease and fibrosis changes. Possible superimposed edema. Heart is borderline in size. Right Port-A-Cath remains in place, unchanged. Biliary drains noted in the right abdomen. IMPRESSION: Stable chronic interstitial lung disease/fibrosis. Possible superimposed edema, stable. Electronically Signed   By: Rolm Baptise M.D.   On: 08/15/2017 07:59   Ct Chest W Contrast  Result Date: 08/15/2017 CLINICAL DATA:  Sepsis. Fever. Tachycardia. Tachypnea. Cholangiocarcinoma with  biliary tubes. History of ulcerative colitis, diverticulitis, sarcoidosis. EXAM: CT CHEST, ABDOMEN, AND PELVIS WITH CONTRAST TECHNIQUE: Multidetector CT imaging of the chest, abdomen and pelvis was performed following the standard protocol during bolus administration of intravenous contrast. CONTRAST:  66m ISOVUE-370 IOPAMIDOL (ISOVUE-370) INJECTION 76% COMPARISON:  03/16/2017 CT chest.  08/08/2017 CT abdomen and pelvis. FINDINGS: CT CHEST FINDINGS Cardiovascular: Normal caliber thoracic aorta with scattered calcification. Mild cardiac enlargement. Calcification in the mitral and aortic valves. No pericardial effusion. Central venous catheter tip in the SVC. Mediastinum/Nodes: Diffuse mediastinal lymphadenopathy. Largest pretracheal nodes measure about 13 mm short axis dimension. Aortopulmonic window node measures 13 mm short axis dimension. Similar to previous study. Esophagus is decompressed. Retrocrural lymphadenopathy. Diffuse multinodular enlargement of the thyroid gland with right thyroid calcifications. Likely multinodular goiter. Lungs/Pleura: Diffuse mosaic attenuation pattern in the lungs with patchy peripheral fibrosis and coarse fibrosis in the lung bases with honeycomb change and bronchiectasis. Changes likely represent chronic fibrosis and bronchiectasis with superimposed edema. Calcified pleural plaques. No pleural effusions. No pneumothorax. Airways are patent. Musculoskeletal: Degenerative changes in the spine. Vertebral hemangiomas. No destructive bone lesions. CT ABDOMEN PELVIS FINDINGS Hepatobiliary: Internal external biliary drainage catheter is in place with left and right insertion drainage catheters. Distal pigtails in the duodenum. No bile duct dilatation. Gallbladder is contracted with gas in the gallbladder consistent with  instrumentation. Postoperative partial left hepatectomy. Pancreas: Unremarkable. No pancreatic ductal dilatation or surrounding inflammatory changes. Spleen: Normal in size without focal abnormality. Adrenals/Urinary Tract: Adrenal glands are unremarkable. Kidneys are normal, without renal calculi, focal lesion, or hydronephrosis. Bladder is unremarkable. Stomach/Bowel: Stomach, small bowel, and colon are not abnormally distended. Contrast material is present in the cecum suggesting no evidence of small bowel obstruction. Scattered stool throughout the colon. Diverticulosis of the colon. No inflammatory changes. Vascular/Lymphatic: Calcification of the aorta without aneurysm. Mild prominence of retroperitoneal lymph nodes without pathologic enlargement. Reproductive: Status post hysterectomy. No adnexal masses. Other: There is a small amount of free fluid in the pericolic gutters and in the pelvis, new since prior study. No loculated fluid collections to suggest abscess. Edema in the subcutaneous fatty tissues. No free air. Musculoskeletal: Degenerative changes in the spine. No destructive bone lesions. IMPRESSION: 1. Chronic fibrosis in the lungs with basilar honeycomb changes and bronchiectasis. 2. Superimposed edema in the lungs. 3. Mediastinal lymphadenopathy similar to previous study. 4. Internal external biliary drainage catheters remain in place. No bile duct dilatation. Pneumobilia consistent with procedures. 5. Small amount of free fluid in the pericolic gutters and in the pelvis is new since previous study. No loculated collection to suggest abscess. 6. Retrocrural and retroperitoneal lymphadenopathy is unchanged. Electronically Signed   By: WLucienne CapersM.D.   On: 08/15/2017 05:15   Ct Abdomen Pelvis W Contrast  Result Date: 08/15/2017 CLINICAL DATA:  Sepsis. Fever. Tachycardia. Tachypnea. Cholangiocarcinoma with biliary tubes. History of ulcerative colitis, diverticulitis, sarcoidosis. EXAM: CT  CHEST, ABDOMEN, AND PELVIS WITH CONTRAST TECHNIQUE: Multidetector CT imaging of the chest, abdomen and pelvis was performed following the standard protocol during bolus administration of intravenous contrast. CONTRAST:  754mISOVUE-370 IOPAMIDOL (ISOVUE-370) INJECTION 76% COMPARISON:  03/16/2017 CT chest.  08/08/2017 CT abdomen and pelvis. FINDINGS: CT CHEST FINDINGS Cardiovascular: Normal caliber thoracic aorta with scattered calcification. Mild cardiac enlargement. Calcification in the mitral and aortic valves. No pericardial effusion. Central venous catheter tip in the SVC. Mediastinum/Nodes: Diffuse mediastinal lymphadenopathy. Largest pretracheal nodes measure about 13 mm short axis dimension. Aortopulmonic window node measures 13 mm short axis  dimension. Similar to previous study. Esophagus is decompressed. Retrocrural lymphadenopathy. Diffuse multinodular enlargement of the thyroid gland with right thyroid calcifications. Likely multinodular goiter. Lungs/Pleura: Diffuse mosaic attenuation pattern in the lungs with patchy peripheral fibrosis and coarse fibrosis in the lung bases with honeycomb change and bronchiectasis. Changes likely represent chronic fibrosis and bronchiectasis with superimposed edema. Calcified pleural plaques. No pleural effusions. No pneumothorax. Airways are patent. Musculoskeletal: Degenerative changes in the spine. Vertebral hemangiomas. No destructive bone lesions. CT ABDOMEN PELVIS FINDINGS Hepatobiliary: Internal external biliary drainage catheter is in place with left and right insertion drainage catheters. Distal pigtails in the duodenum. No bile duct dilatation. Gallbladder is contracted with gas in the gallbladder consistent with instrumentation. Postoperative partial left hepatectomy. Pancreas: Unremarkable. No pancreatic ductal dilatation or surrounding inflammatory changes. Spleen: Normal in size without focal abnormality. Adrenals/Urinary Tract: Adrenal glands are  unremarkable. Kidneys are normal, without renal calculi, focal lesion, or hydronephrosis. Bladder is unremarkable. Stomach/Bowel: Stomach, small bowel, and colon are not abnormally distended. Contrast material is present in the cecum suggesting no evidence of small bowel obstruction. Scattered stool throughout the colon. Diverticulosis of the colon. No inflammatory changes. Vascular/Lymphatic: Calcification of the aorta without aneurysm. Mild prominence of retroperitoneal lymph nodes without pathologic enlargement. Reproductive: Status post hysterectomy. No adnexal masses. Other: There is a small amount of free fluid in the pericolic gutters and in the pelvis, new since prior study. No loculated fluid collections to suggest abscess. Edema in the subcutaneous fatty tissues. No free air. Musculoskeletal: Degenerative changes in the spine. No destructive bone lesions. IMPRESSION: 1. Chronic fibrosis in the lungs with basilar honeycomb changes and bronchiectasis. 2. Superimposed edema in the lungs. 3. Mediastinal lymphadenopathy similar to previous study. 4. Internal external biliary drainage catheters remain in place. No bile duct dilatation. Pneumobilia consistent with procedures. 5. Small amount of free fluid in the pericolic gutters and in the pelvis is new since previous study. No loculated collection to suggest abscess. 6. Retrocrural and retroperitoneal lymphadenopathy is unchanged. Electronically Signed   By: Lucienne Capers M.D.   On: 08/15/2017 05:15   Ct Abdomen Pelvis W Contrast  Result Date: 08/09/2017 CLINICAL DATA:  History of cholangiocarcinoma with fever. EXAM: CT ABDOMEN AND PELVIS WITH CONTRAST TECHNIQUE: Multidetector CT imaging of the abdomen and pelvis was performed using the standard protocol following bolus administration of intravenous contrast. CONTRAST:  158m ISOVUE-300 IOPAMIDOL (ISOVUE-300) INJECTION 61% COMPARISON:  06/07/2017 FINDINGS: Lower chest: Stable dense pulmonary scarring  changes and emphysema. Heart is normal in size. Calcifications noted at the mitral valve annulus. Hepatobiliary: Stable biliary drainage catheters. No complicating features. No intrahepatic biliary dilatation. Stable surgical changes from a partial hepatectomy. Stable air in the gallbladder. No intrahepatic abscess. Pancreas: No mass, inflammation or ductal dilatation. Spleen: Normal size.  No focal lesions. Adrenals/Urinary Tract: Stable mild nodularity of the left adrenal gland. Both kidneys are unremarkable and stable. The bladder appears normal. Stomach/Bowel: The stomach, duodenum, small bowel and colon are grossly normal. No acute inflammatory changes, mass lesions or obstructive findings. No evidence of bowel perforation. Contrast gets all the way to the colon. Moderate sigmoid diverticulosis. Vascular/Lymphatic: Stable aortic calcifications. No aneurysm or dissection. The branch vessels are patent. The major venous structures are patent. Stable retrocrural and retroperitoneal lymph nodes. Reproductive: The uterus is surgically absent. Left ovary is still present and appears normal. Small cyst is noted. The right ovary is not identified for certain. Other: No ascites. Surgical changes involving the anterior abdominal wall. Musculoskeletal: No significant bony  findings. IMPRESSION: 1. Stable biliary drainage catheters. No complicating features. No biliary distention. 2. Stable surgical changes from partial hepatectomy and Roux-en-Y procedure. 3. No abdominal/pelvic abscess or evidence of perforation or obstruction. 4. Persistent retrocrural and retroperitoneal adenopathy. Electronically Signed   By: Marijo Sanes M.D.   On: 08/09/2017 11:29   Dg Chest Portable 1 View  Result Date: 08/12/2017 CLINICAL DATA:  Shortness of breath. Altered mental status. Fever. Patient was seen here 2 days ago for respiratory failure and sepsis. EXAM: PORTABLE CHEST 1 VIEW COMPARISON:  08/12/2017 FINDINGS: Cardiac enlargement  with pulmonary vascular congestion. Increasing lung infiltrates likely representing increasing edema. Superimposed pneumonia may also be present. Small bilateral pleural effusions. No pneumothorax. Power port type right central venous catheter with tip over the cavoatrial junction region. Surgical clips in the right lung. Postoperative changes in the left shoulder. IMPRESSION: Cardiac enlargement with pulmonary vascular congestion. Increasing lung infiltrates bilaterally likely represent increasing edema. Superimposed pneumonia is also possible. Electronically Signed   By: Lucienne Capers M.D.   On: 08/12/2017 21:18   Dg Chest Port 1 View  Result Date: 08/12/2017 CLINICAL DATA:  Altered mental status and fever EXAM: PORTABLE CHEST 1 VIEW COMPARISON:  Chest radiograph 08/07/2017 FINDINGS: Power injectable right chest wall Port-A-Cath follows and internal jugular vein approach with tip in the lower SVC. There is cardiomegaly with enlarged pulmonary arteries. Bibasilar predominant interstitial opacities are unchanged. No pneumothorax or sizable pleural effusion. Postsurgical changes in the lower right lung. IMPRESSION: Unchanged appearance of the chest compared to 08/07/2017 radiograph with persistent cardiomegaly and enlarged pulmonary arteries. Bibasilar interstitial opacities are also unchanged. Electronically Signed   By: Ulyses Jarred M.D.   On: 08/12/2017 19:12   Dg Chest Port 1 View  Result Date: 08/07/2017 CLINICAL DATA:  Short of breath and cough. Lupus. Hypertension. COPD. Diabetes. EXAM: PORTABLE CHEST 1 VIEW COMPARISON:  One day prior FINDINGS: Right Port-A-Cath terminates at the mid SVC. Midline trachea. Cardiomegaly accentuated by AP portable technique. No pleural effusion or pneumothorax. Basilar and peripheral predominant interstitial thickening is similar, given differences in technique. No well-defined lobar consolidation. Surgical clips project over the right lung base. IMPRESSION: Basilar  predominant interstitial thickening, likely all attributed to interstitial lung disease when correlated with 02/24/2017 CT. Cardiomegaly without convincing evidence of superimposed congestive failure. Electronically Signed   By: Abigail Miyamoto M.D.   On: 08/07/2017 07:44   Dg Chest Port 1 View  Result Date: 08/06/2017 CLINICAL DATA:  Sepsis EXAM: PORTABLE CHEST 1 VIEW COMPARISON:  04/29/2017 FINDINGS: Right Port-A-Cath is unchanged. Cardiomegaly with vascular congestion. Interstitial prominence throughout the lungs could reflect interstitial edema. Bibasilar atelectasis. No visible effusions. IMPRESSION: Cardiomegaly with vascular congestion and interstitial prominence, possibly representing interstitial edema. Bibasilar atelectasis. Electronically Signed   By: Rolm Baptise M.D.   On: 08/06/2017 23:49    Assessment and plan- Patient is a 77 y.o. 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   #Cholangio carcinoma: Not on chemotherapy.  Stable disease. 08/15/2017 CT images showed no evidence of disease progression.  #Sepsis: Blood culture pending.  Patient has had extensive infectious work-up without definitive source of infection found. Patient had similar episodes of sepsis with multiple hospitalizations in January and February. Febrile disease resolved after her biliary drains were exchanged.  I suspect she is experiencing similar episodes of transient bacteremia.  She was due to have PPD drain exchanged in mid  April which was delayed due to hospitalization.   I discussed with Dr. Benjie Karvonen and recommend consulting IR to see if bile duct drain exchange can be done during this admission. Follow up blood culture. Defer antibiotic regimen to ID.  #Chronic steroid use: Morning cortisol levels are normal.  Less likely adrenal insufficiency.  Recommend patient to be restarted  on prednisone 5 mg daily.  #Cutaneous lupus: Follow-up with rheumatology.  Continue Plaquenil and azathioprine  Above plan discussed with Dr. Benjie Karvonen.  Thank you for allowing me to participate in the care of this patient.  Total face to face encounter time for this patient visit was 70 min. >50% of the time was  spent in counseling and coordination of care.  Earlie Server, MD, PhD Hematology Oncology [facility] Pager- [phone] 08/16/2017

## 2017-08-16 NOTE — Progress Notes (Signed)
Pharmacy Antibiotic Note  Regina Hood is a 77 y.o. female admitted on 08/12/2017 with pneumonia. Patient with recent hospital admission. Pharmacy has been consulted for cefepime dosing. Vancomycin discontinued secondary to negative MRSA PCR. Metronidazole added to regimen due to history of cholangiocarcinoma with biliary drain in place.   She is on immunosuppression for lupus Per ID 4/29- discontinue metronidazole   Plan: Day 5- Cefepime 1g IV Q12hr.    Height: 5' 2"  (157.5 cm) Weight: 163 lb 12.8 oz (74.3 kg) IBW/kg (Calculated) : 50.1  Temp (24hrs), Avg:98.2 F (36.8 C), Min:97.6 F (36.4 C), Max:98.9 F (37.2 C)  Recent Labs  Lab 08/12/17 1904 08/12/17 2138 08/13/17 0312 08/13/17 0607 08/14/17 0702 08/14/17 2047 08/15/17 0611 08/16/17 0429  WBC 10.3  --   --  11.6* 9.1 11.9* 8.4  --   CREATININE 0.62  --   --  0.61 0.81 0.66 0.65 0.76  LATICACIDVEN 3.8* 1.7 1.3 1.1  --  2.9* 1.0  --     Estimated Creatinine Clearance: 56.5 mL/min (by C-G formula based on SCr of 0.76 mg/dL).    Allergies  Allergen Reactions  . Penicillins Nausea Only    Antimicrobials this admission: Vancomycin 4/26 >> 4/27 Cefepime 4/26 >>  Metronidazole 4/26 >> 4/29  Dose adjustments this admission: N/A  Microbiology results: 4/26 BCx: no growth < 12 hours  4/26 MRSA PCR: negative   Thank you for allowing pharmacy to be a part of this patient's care.  Alfie Alderfer A 08/16/2017 8:46 AM

## 2017-08-16 NOTE — Progress Notes (Signed)
Pharmacy Electrolyte Monitoring Consult:  Pharmacy consulted to assist in monitoring and replacing electrolytes in this 77 y.o. female admitted on 08/12/2017 with Altered Mental Status and Fever  Labs:  Sodium (mmol/L)  Date Value  08/16/2017 132 (L)   Potassium (mmol/L)  Date Value  08/16/2017 3.6   Magnesium (mg/dL)  Date Value  08/03/2017 1.8   Phosphorus (mg/dL)  Date Value  05/05/2017 3.3   Calcium (mg/dL)  Date Value  08/16/2017 8.1 (L)   Albumin (g/dL)  Date Value  08/14/2017 2.6 (L)    Plan: Patient tolerating oral medications, unless clinically necessary patient should receive oral replacement.   4/29:  K 3.6.  Patient on lasix 20 mg IV q8h. Will give KCL po 20 meq x 1 Will recheck electrolytes with am labs.   Pharmacy will continue to monitor and adjust per consult.   Katee Wentland A 08/16/2017 8:43 AM

## 2017-08-16 NOTE — Progress Notes (Signed)
[place] INFECTIOUS DISEASE PROGRESS NOTE Date of Admission:  08/12/2017     ID: LAJEAN BOESE is a 77 y.o. female with cholangiocarcinoma, biliary drains, weakness and recurrent fevers.  Active Problems:   Sepsis ([place])   Subjective: No fevers, still very weak but sitting up at side of bed  ROS  Eleven systems are reviewed and negative except per hpi  Medications:  Antibiotics Given (last 72 hours)    Date/Time Action Medication Dose Rate   08/13/17 1635 New Bag/Given   metroNIDAZOLE (FLAGYL) IVPB 500 mg 500 mg 100 mL/hr   08/13/17 2119 New Bag/Given   ceFEPIme (MAXIPIME) 1 g in sodium chloride 0.9 % 100 mL IVPB 1 g 200 mL/hr   08/13/17 2121 Given   hydroxychloroquine (PLAQUENIL) tablet 200 mg 200 mg    08/14/17 0157 New Bag/Given   metroNIDAZOLE (FLAGYL) IVPB 500 mg 500 mg 100 mL/hr   08/14/17 0700 New Bag/Given   metroNIDAZOLE (FLAGYL) IVPB 500 mg 500 mg 100 mL/hr   08/14/17 0829 New Bag/Given   ceFEPIme (MAXIPIME) 1 g in sodium chloride 0.9 % 100 mL IVPB 1 g 200 mL/hr   08/14/17 0830 Given   hydroxychloroquine (PLAQUENIL) tablet 200 mg 200 mg    08/14/17 1451 New Bag/Given   metroNIDAZOLE (FLAGYL) IVPB 500 mg 500 mg 100 mL/hr   08/14/17 2022 New Bag/Given   ceFEPIme (MAXIPIME) 1 g in sodium chloride 0.9 % 100 mL IVPB 1 g 200 mL/hr   08/14/17 2233 Given   hydroxychloroquine (PLAQUENIL) tablet 200 mg 200 mg    08/14/17 2329 New Bag/Given   metroNIDAZOLE (FLAGYL) IVPB 500 mg 500 mg 100 mL/hr   08/15/17 0700 New Bag/Given  [given by Maia [place]]   metroNIDAZOLE (FLAGYL) IVPB 500 mg 500 mg 100 mL/hr   08/15/17 1308 Given   hydroxychloroquine (PLAQUENIL) tablet 200 mg 200 mg    08/15/17 0931 New Bag/Given   ceFEPIme (MAXIPIME) 1 g in sodium chloride 0.9 % 100 mL IVPB 1 g 200 mL/hr   08/15/17 1442 New Bag/Given   metroNIDAZOLE (FLAGYL) IVPB 500 mg 500 mg 100 mL/hr   08/15/17 2055 New Bag/Given   ceFEPIme (MAXIPIME) 1 g in sodium chloride 0.9 % 100 mL IVPB 1 g 200  mL/hr   08/15/17 2103 Given   hydroxychloroquine (PLAQUENIL) tablet 200 mg 200 mg    08/16/17 6578 Given   hydroxychloroquine (PLAQUENIL) tablet 200 mg 200 mg    08/16/17 4696 New Bag/Given   ceFEPIme (MAXIPIME) 1 g in sodium chloride 0.9 % 100 mL IVPB 1 g 200 mL/hr     . azaTHIOprine  50 mg Oral Daily  . docusate sodium  100 mg Oral BID  . feeding supplement (ENSURE ENLIVE)  237 mL Oral BID BM  . furosemide  20 mg Intravenous Q8H  . heparin  5,000 Units Subcutaneous Q8H  . hydroxychloroquine  200 mg Oral BID  . insulin aspart  0-5 Units Subcutaneous QHS  . insulin aspart  0-9 Units Subcutaneous TID WC  . metoprolol tartrate  75 mg Oral BID  . pantoprazole  40 mg Oral Daily  . [START ON 08/17/2017] predniSONE  5 mg Oral Q breakfast  . sodium chloride flush  10 mL Intracatheter Q0600  . venlafaxine XR  75 mg Oral TID  . vitamin B-12  1,000 mcg Oral Daily    Objective: Vital signs in last 24 hours: Temp:  [97.7 F (36.5 C)-98.9 F (37.2 C)] 97.7 F (36.5 C) (04/30 0405) Pulse Rate:  [  77-116] 77 (04/30 1347) Resp:  [18] 18 (04/30 1347) BP: (99-131)/(61-71) 120/66 (04/30 1347) SpO2:  [98 %-100 %] 100 % (04/30 1347) Physical Exam  Constitutional:  oriented to person, place, and time. Frail  HENT: [place]/AT, PERRLA, no scleral icterus Mouth/Throat: Oropharynx is clear and moist. No oropharyngeal exudate.  Cardiovascular: Normal rate, regular rhythm and normal heart sounds. P Ulmonary/Chest poor air movement, velcro crackles Neck = supple, no nuchal rigidity Abdominal: Soft. Bowel sounds are normal.  exhibits no distension. There is no tenderness. Biliary drains in place  Lymphadenopathy: no cervical adenopathy. No axillary adenopathy Neurological: alert and oriented to person, place, and time.  Skin: Skin is warm and dry. No rash noted. No erythema.  Psychiatric: a normal mood and affect.  behavior is normal.   Lab Results Recent Labs    08/14/17 2047 08/15/17 0611  08/16/17 0429  WBC 11.9* 8.4  --   HGB 8.7* 8.3*  --   HCT 27.0* 24.4*  --   NA 133* 134* 132*  K 3.7 3.3* 3.6  CL 98* 97* 93*  CO2 27 31 33*  BUN 20 16 18   CREATININE 0.66 0.65 0.76    Microbiology: Results for orders placed or performed during the hospital encounter of 08/12/17  Blood Culture (routine x 2)     Status: None (Preliminary result)   Collection Time: 08/12/17  7:04 PM  Result Value Ref Range Status   Specimen Description BLOOD RIGHT HAND  Final   Special Requests   Final    BOTTLES DRAWN AEROBIC AND ANAEROBIC Blood Culture adequate volume   Culture   Final    NO GROWTH 4 DAYS Performed at [facility], [address] [place], [place] [address]    Report Status PENDING  Incomplete  Blood Culture (routine x 2)     Status: None (Preliminary result)   Collection Time: 08/12/17  7:05 PM  Result Value Ref Range Status   Specimen Description BLOOD RIGHT ANTECUBITAL  Final   Special Requests   Final    BOTTLES DRAWN AEROBIC AND ANAEROBIC Blood Culture results may not be optimal due to an excessive volume of blood received in culture bottles   Culture   Final    NO GROWTH 4 DAYS Performed at [facility], [address] [place], [place] [address]    Report Status PENDING  Incomplete  MRSA PCR Screening     Status: None   Collection Time: 08/12/17 11:17 PM  Result Value Ref Range Status   MRSA by PCR NEGATIVE NEGATIVE Final    Comment:        The GeneXpert MRSA Assay (FDA approved for NASAL specimens only), is one component of a comprehensive MRSA colonization surveillance program. It is not intended to diagnose MRSA infection nor to guide or monitor treatment for MRSA infections. Performed at [facility], [address] [place], [place] [address]   Culture, blood (single) w Reflex to ID Panel     Status: None (Preliminary result)   Collection Time: 08/14/17  8:47 PM  Result Value Ref Range Status   Specimen Description  BLOOD RIGHT ANTECUBITAL  Final   Special Requests   Final    BOTTLES DRAWN AEROBIC AND ANAEROBIC Blood Culture adequate volume   Culture   Final    NO GROWTH 2 DAYS Performed at [facility], [address] [place], [place] [address]    Report Status PENDING  Incomplete    Studies/Results: Dg Chest 1  View  Result Date: 08/14/2017 CLINICAL DATA:  Hypoxia EXAM: CHEST  1 VIEW COMPARISON:  08/12/2017, CT 03/16/2017 FINDINGS: Stable cardiomegaly and aortic atherosclerosis. Port catheter tip terminates in the distal SVC. Chronic coarsened interstitial lung markings and atelectasis is redemonstrated. Calcified pleural plaque at the right lung base. Chain sutures project over the right lung base as well. Superimposed interstitial edema suspected. Trace bilateral pleural effusions are present. Tiny nodular densities project over the left upper lobe and are believed to represent tiny foci pleural thickening based on prior CT. Small pulmonary nodules are not entirely excluded. Left humerus plate and screw fixation. IMPRESSION: 1. Cardiomegaly with aortic atherosclerosis. Diffuse coarsened interstitial lung markings and atelectasis with probable diffuse interstitial edema. Trace bilateral pleural effusions. 2. Calcified pleural plaque at the right lung base. 3. Tiny nodular densities overlying the left upper lobe, nonspecific. Pulmonary nodules versus focal pleural plaque. Electronically Signed   By: Ashley Royalty M.D.   On: 08/14/2017 22:23   Dg Chest 2 View  Result Date: 08/15/2017 CLINICAL DATA:  Chronic respiratory failure, interstitial lung disease. Productive cough, shortness of breath, wheezing EXAM: CHEST - 2 VIEW COMPARISON:  Chest x-ray 08/14/2017.  Chest CT 08/15/2017. FINDINGS: Severe chronic interstitial lung disease and fibrosis changes. Possible superimposed edema. Heart is borderline in size. Right Port-A-Cath remains in place, unchanged. Biliary drains noted in the right abdomen.  IMPRESSION: Stable chronic interstitial lung disease/fibrosis. Possible superimposed edema, stable. Electronically Signed   By: Rolm Baptise M.D.   On: 08/15/2017 07:59   Ct Chest W Contrast  Result Date: 08/15/2017 CLINICAL DATA:  Sepsis. Fever. Tachycardia. Tachypnea. Cholangiocarcinoma with biliary tubes. History of ulcerative colitis, diverticulitis, sarcoidosis. EXAM: CT CHEST, ABDOMEN, AND PELVIS WITH CONTRAST TECHNIQUE: Multidetector CT imaging of the chest, abdomen and pelvis was performed following the standard protocol during bolus administration of intravenous contrast. CONTRAST:  25m ISOVUE-370 IOPAMIDOL (ISOVUE-370) INJECTION 76% COMPARISON:  03/16/2017 CT chest.  08/08/2017 CT abdomen and pelvis. FINDINGS: CT CHEST FINDINGS Cardiovascular: Normal caliber thoracic aorta with scattered calcification. Mild cardiac enlargement. Calcification in the mitral and aortic valves. No pericardial effusion. Central venous catheter tip in the SVC. Mediastinum/Nodes: Diffuse mediastinal lymphadenopathy. Largest pretracheal nodes measure about 13 mm short axis dimension. Aortopulmonic window node measures 13 mm short axis dimension. Similar to previous study. Esophagus is decompressed. Retrocrural lymphadenopathy. Diffuse multinodular enlargement of the thyroid gland with right thyroid calcifications. Likely multinodular goiter. Lungs/Pleura: Diffuse mosaic attenuation pattern in the lungs with patchy peripheral fibrosis and coarse fibrosis in the lung bases with honeycomb change and bronchiectasis. Changes likely represent chronic fibrosis and bronchiectasis with superimposed edema. Calcified pleural plaques. No pleural effusions. No pneumothorax. Airways are patent. Musculoskeletal: Degenerative changes in the spine. Vertebral hemangiomas. No destructive bone lesions. CT ABDOMEN PELVIS FINDINGS Hepatobiliary: Internal external biliary drainage catheter is in place with left and right insertion drainage  catheters. Distal pigtails in the duodenum. No bile duct dilatation. Gallbladder is contracted with gas in the gallbladder consistent with instrumentation. Postoperative partial left hepatectomy. Pancreas: Unremarkable. No pancreatic ductal dilatation or surrounding inflammatory changes. Spleen: Normal in size without focal abnormality. Adrenals/Urinary Tract: Adrenal glands are unremarkable. Kidneys are normal, without renal calculi, focal lesion, or hydronephrosis. Bladder is unremarkable. Stomach/Bowel: Stomach, small bowel, and colon are not abnormally distended. Contrast material is present in the cecum suggesting no evidence of small bowel obstruction. Scattered stool throughout the colon. Diverticulosis of the colon. No inflammatory changes. Vascular/Lymphatic: Calcification of the aorta without aneurysm. Mild prominence of retroperitoneal lymph  nodes without pathologic enlargement. Reproductive: Status post hysterectomy. No adnexal masses. Other: There is a small amount of free fluid in the pericolic gutters and in the pelvis, new since prior study. No loculated fluid collections to suggest abscess. Edema in the subcutaneous fatty tissues. No free air. Musculoskeletal: Degenerative changes in the spine. No destructive bone lesions. IMPRESSION: 1. Chronic fibrosis in the lungs with basilar honeycomb changes and bronchiectasis. 2. Superimposed edema in the lungs. 3. Mediastinal lymphadenopathy similar to previous study. 4. Internal external biliary drainage catheters remain in place. No bile duct dilatation. Pneumobilia consistent with procedures. 5. Small amount of free fluid in the pericolic gutters and in the pelvis is new since previous study. No loculated collection to suggest abscess. 6. Retrocrural and retroperitoneal lymphadenopathy is unchanged. Electronically Signed   By: Lucienne Capers M.D.   On: 08/15/2017 05:15   Ct Abdomen Pelvis W Contrast  Result Date: 08/15/2017 CLINICAL DATA:  Sepsis.  Fever. Tachycardia. Tachypnea. Cholangiocarcinoma with biliary tubes. History of ulcerative colitis, diverticulitis, sarcoidosis. EXAM: CT CHEST, ABDOMEN, AND PELVIS WITH CONTRAST TECHNIQUE: Multidetector CT imaging of the chest, abdomen and pelvis was performed following the standard protocol during bolus administration of intravenous contrast. CONTRAST:  60m ISOVUE-370 IOPAMIDOL (ISOVUE-370) INJECTION 76% COMPARISON:  03/16/2017 CT chest.  08/08/2017 CT abdomen and pelvis. FINDINGS: CT CHEST FINDINGS Cardiovascular: Normal caliber thoracic aorta with scattered calcification. Mild cardiac enlargement. Calcification in the mitral and aortic valves. No pericardial effusion. Central venous catheter tip in the SVC. Mediastinum/Nodes: Diffuse mediastinal lymphadenopathy. Largest pretracheal nodes measure about 13 mm short axis dimension. Aortopulmonic window node measures 13 mm short axis dimension. Similar to previous study. Esophagus is decompressed. Retrocrural lymphadenopathy. Diffuse multinodular enlargement of the thyroid gland with right thyroid calcifications. Likely multinodular goiter. Lungs/Pleura: Diffuse mosaic attenuation pattern in the lungs with patchy peripheral fibrosis and coarse fibrosis in the lung bases with honeycomb change and bronchiectasis. Changes likely represent chronic fibrosis and bronchiectasis with superimposed edema. Calcified pleural plaques. No pleural effusions. No pneumothorax. Airways are patent. Musculoskeletal: Degenerative changes in the spine. Vertebral hemangiomas. No destructive bone lesions. CT ABDOMEN PELVIS FINDINGS Hepatobiliary: Internal external biliary drainage catheter is in place with left and right insertion drainage catheters. Distal pigtails in the duodenum. No bile duct dilatation. Gallbladder is contracted with gas in the gallbladder consistent with instrumentation. Postoperative partial left hepatectomy. Pancreas: Unremarkable. No pancreatic ductal dilatation  or surrounding inflammatory changes. Spleen: Normal in size without focal abnormality. Adrenals/Urinary Tract: Adrenal glands are unremarkable. Kidneys are normal, without renal calculi, focal lesion, or hydronephrosis. Bladder is unremarkable. Stomach/Bowel: Stomach, small bowel, and colon are not abnormally distended. Contrast material is present in the cecum suggesting no evidence of small bowel obstruction. Scattered stool throughout the colon. Diverticulosis of the colon. No inflammatory changes. Vascular/Lymphatic: Calcification of the aorta without aneurysm. Mild prominence of retroperitoneal lymph nodes without pathologic enlargement. Reproductive: Status post hysterectomy. No adnexal masses. Other: There is a small amount of free fluid in the pericolic gutters and in the pelvis, new since prior study. No loculated fluid collections to suggest abscess. Edema in the subcutaneous fatty tissues. No free air. Musculoskeletal: Degenerative changes in the spine. No destructive bone lesions. IMPRESSION: 1. Chronic fibrosis in the lungs with basilar honeycomb changes and bronchiectasis. 2. Superimposed edema in the lungs. 3. Mediastinal lymphadenopathy similar to previous study. 4. Internal external biliary drainage catheters remain in place. No bile duct dilatation. Pneumobilia consistent with procedures. 5. Small amount of free fluid in the pericolic gutters  and in the pelvis is new since previous study. No loculated collection to suggest abscess. 6. Retrocrural and retroperitoneal lymphadenopathy is unchanged. Electronically Signed   By: Lucienne Capers M.D.   On: 08/15/2017 05:15    Assessment/Plan: SIMREN POPSON is a 77 y.o. 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 She is chornically ill appearing, hypoxic (has ILD).  CT chest abd pelvis neg except some edema in lungs on top of fibrosis changes.  At her last  admission lfts only mildly elevated, cxr shows chronic changes, flu pcr negative, ua negative. [place] neg.  She does have a portacath in place but it is not tender or inflamed. CT abd negative. She improved with IV abx and was dced on oral cipro and flagyl.  So far she has had extensive evaluation for a source of sepsis with all imaging and cultures negative.  She was on chronic prednisone for several months but then this was stopped in April. Query adrenal insufficiency.  4/30 - cortisol level wnl. No futher fevers. For exchange of drains. Recommendations Agree with change of biliary drains Cont azithromycin for atypical coverage. Cont cefepime for now.    Thank you very much for the consult. Will follow with you.  Leonel Ramsay   08/16/2017, 1:52 PM

## 2017-08-17 ENCOUNTER — Ambulatory Visit
Admit: 2017-08-17 | Discharge: 2017-08-17 | Disposition: A | Discharge status: Home / Self Care | Payer: PPO | Attending: Interventional Radiology | Admitting: Interventional Radiology

## 2017-08-17 DIAGNOSIS — C221 Intrahepatic bile duct carcinoma: Secondary | ICD-10-CM | POA: Diagnosis not present

## 2017-08-17 HISTORY — PX: IR EXCHANGE BILIARY DRAIN: IMG6046

## 2017-08-17 LAB — BASIC METABOLIC PANEL
Anion gap: 5 (ref 5–15)
BUN: 19 mg/dL (ref 6–20)
CALCIUM: 8 mg/dL — AB (ref 8.9–10.3)
CO2: 36 mmol/L — AB (ref 22–32)
CREATININE: 0.74 mg/dL (ref 0.44–1.00)
Chloride: 91 mmol/L — ABNORMAL LOW (ref 101–111)
GFR calc Af Amer: >60 mL/min (ref >60)
Glucose, Bld: 88 mg/dL (ref 65–99)
Potassium: 3.6 mmol/L (ref 3.5–5.1)
Sodium: 132 mmol/L — ABNORMAL LOW (ref 135–145)

## 2017-08-17 LAB — GLUCOSE, CAPILLARY
GLUCOSE-CAPILLARY: 144 mg/dL — AB (ref 65–99)
GLUCOSE-CAPILLARY: 91 mg/dL (ref 65–99)
Glucose-Capillary: 92 mg/dL (ref 65–99)
Glucose-Capillary: 149 mg/dL — ABNORMAL HIGH (ref 65–99)

## 2017-08-17 LAB — CULTURE, BLOOD (ROUTINE X 2)
CULTURE: NO GROWTH
CULTURE: NO GROWTH
SPECIAL REQUESTS: ADEQUATE

## 2017-08-17 MED ORDER — SODIUM CHLORIDE 0.9 % IV SOLN
500.0000 mg | INTRAVENOUS | Status: DC
  Administered 2017-08-17 – 2017-08-18 (×2): 500 mg via INTRAVENOUS
  Filled 2017-08-17 (×3): qty 500

## 2017-08-17 MED ORDER — IOPAMIDOL (ISOVUE-300) INJECTION 61%
10.0000 mL | Freq: Once | INTRAVENOUS | Status: AC | PRN
  Administered 2017-08-17: 10 mL via INTRAVENOUS

## 2017-08-17 MED ORDER — FENTANYL CITRATE (PF) 100 MCG/2ML IJ SOLN
INTRAMUSCULAR | Status: AC
  Filled 2017-08-17: qty 2

## 2017-08-17 MED ORDER — FENTANYL CITRATE (PF) 100 MCG/2ML IJ SOLN
INTRAMUSCULAR | Status: AC | PRN
  Administered 2017-08-17 (×2): 50 ug via INTRAVENOUS

## 2017-08-17 MED ORDER — SODIUM CHLORIDE 0.9 % IV SOLN
INTRAVENOUS | Status: DC
  Administered 2017-08-17: 10:00:00 via INTRAVENOUS

## 2017-08-17 MED ORDER — SODIUM CHLORIDE 0.9% FLUSH: 5.0000 mL | Freq: Three times a day (TID) | INTRAVENOUS | Status: DC

## 2017-08-17 MED ORDER — HYDROCODONE-ACETAMINOPHEN 5-325 MG PO TABS
1.0000 | ORAL_TABLET | ORAL | Status: DC | PRN
Start: 2017-08-17 — End: 2017-08-18
  Administered 2017-08-17 – 2017-08-18 (×4): 2 via ORAL
  Filled 2017-08-17 (×4): qty 2

## 2017-08-17 MED ORDER — LIDOCAINE HCL (PF) 1 % IJ SOLN
INTRAMUSCULAR | Status: AC
  Filled 2017-08-17: qty 30

## 2017-08-17 MED ORDER — MIDAZOLAM HCL 5 MG/5ML IJ SOLN
INTRAMUSCULAR | Status: AC | PRN
  Administered 2017-08-17 (×2): 1 mg via INTRAVENOUS

## 2017-08-17 MED ORDER — VANCOMYCIN HCL IN DEXTROSE 1-5 GM/200ML-% IV SOLN
1000.0000 mg | Freq: Once | INTRAVENOUS | Status: AC
  Administered 2017-08-17: 1000 mg via INTRAVENOUS
  Filled 2017-08-17: qty 200

## 2017-08-17 MED ORDER — SENNOSIDES-DOCUSATE SODIUM 8.6-50 MG PO TABS
1.0000 | ORAL_TABLET | Freq: Every day | ORAL | Status: DC
  Administered 2017-08-17: 17:00:00 1 via ORAL
  Filled 2017-08-17: qty 1

## 2017-08-17 MED ORDER — POTASSIUM CHLORIDE CRYS ER 20 MEQ PO TBCR
20.0000 meq | EXTENDED_RELEASE_TABLET | Freq: Every day | ORAL | Status: DC
  Administered 2017-08-17 – 2017-08-18 (×2): 20 meq via ORAL
  Filled 2017-08-17 (×2): qty 1

## 2017-08-17 MED ORDER — HYDROCODONE-ACETAMINOPHEN 5-325 MG PO TABS
ORAL_TABLET | ORAL | Status: AC
  Filled 2017-08-17: qty 2

## 2017-08-17 MED ORDER — MIDAZOLAM HCL 5 MG/5ML IJ SOLN
INTRAMUSCULAR | Status: AC
  Filled 2017-08-17: qty 5

## 2017-08-17 NOTE — Procedures (Signed)
  Procedure: Exchange bilat int/ext bili drain tubes under fluoro 10.73fEBL:   minimal Complications:  none immediate  See full dictation in [facility]  DDillard CannonMD Main # [phone]  [phone]

## 2017-08-17 NOTE — Consult Note (Signed)
   [facility] CM Inpatient Consult   08/17/2017  Regina Hood 1941-01-17 [id number]   Referral received by inpatient case manager for [employer] services related to diagnosis of COPD, DM and 6 admits in 6 months. Called in to patient room and bedside nurse answered. Patient had a procedure today and was not feeling up to speaking on the phone. Requested for liaison to call back tomorrow. Will pass this information to covering liaison for tomorrow.   Rutherford Limerick RN, [facility] Liaison  [phone]) Business Mobile [phone]) Toll free office

## 2017-08-17 NOTE — Care Management (Addendum)
Heads up referral to [facility] for RN PT OT Aide.  Will also receive outpatient palliative.  Chronic oxygen with [facility].  son will transport patient home.  She has requested a rolling walker and elevated commode seat.  [facility] referral.

## 2017-08-17 NOTE — Progress Notes (Signed)
Notified by nurse tech. approx 1600 pt. Vomiting. Nurse aid offered gingerale. Pt. Report slight relief. Administered Zofran. Pt reports nausea decreased. Will continue to monitor

## 2017-08-17 NOTE — Progress Notes (Signed)
Pharmacy Electrolyte Monitoring Consult:  Pharmacy consulted to assist in monitoring and replacing electrolytes in this 77 y.o. female admitted on 08/12/2017 with Altered Mental Status and Fever  Labs:  Sodium (mmol/L)  Date Value  08/17/2017 132 (L)   Potassium (mmol/L)  Date Value  08/17/2017 3.6   Magnesium (mg/dL)  Date Value  08/03/2017 1.8   Phosphorus (mg/dL)  Date Value  05/05/2017 3.3   Calcium (mg/dL)  Date Value  08/17/2017 8.0 (L)   Albumin (g/dL)  Date Value  08/14/2017 2.6 (L)    Plan: Patient tolerating oral medications, unless clinically necessary patient should receive oral replacement.   5/1:  K 3.6.  Patient on lasix 20 mg IV q8h. Will order KCL po 20 meq daily while on lasix as above. Will recheck electrolytes with am labs.   Pharmacy will continue to monitor and adjust per consult.   Teagyn Fishel A 08/17/2017 7:35 AM

## 2017-08-17 NOTE — Progress Notes (Signed)
[facility] INFECTIOUS DISEASE PROGRESS NOTE Date of Admission:  08/12/2017     ID: Regina Hood is a 77 y.o. female with cholangiocarcinoma, biliary drains, weakness and recurrent fevers.  Active Problems:   Severe sepsis ([place])   Cutaneous lupus erythematosus   Subjective: No fevers, still very weak - drains changed  ROS  Eleven systems are reviewed and negative except per hpi  Medications:  Antibiotics Given (last 72 hours)    Date/Time Action Medication Dose Rate   08/14/17 1451 New Bag/Given   metroNIDAZOLE (FLAGYL) IVPB 500 mg 500 mg 100 mL/hr   08/14/17 2022 New Bag/Given   ceFEPIme (MAXIPIME) 1 g in sodium chloride 0.9 % 100 mL IVPB 1 g 200 mL/hr   08/14/17 2233 Given   hydroxychloroquine (PLAQUENIL) tablet 200 mg 200 mg    08/14/17 2329 New Bag/Given   metroNIDAZOLE (FLAGYL) IVPB 500 mg 500 mg 100 mL/hr   08/15/17 0700 New Bag/Given  [given by Maia [place]]   metroNIDAZOLE (FLAGYL) IVPB 500 mg 500 mg 100 mL/hr   08/15/17 0927 Given   hydroxychloroquine (PLAQUENIL) tablet 200 mg 200 mg    08/15/17 0931 New Bag/Given   ceFEPIme (MAXIPIME) 1 g in sodium chloride 0.9 % 100 mL IVPB 1 g 200 mL/hr   08/15/17 1442 New Bag/Given   metroNIDAZOLE (FLAGYL) IVPB 500 mg 500 mg 100 mL/hr   08/15/17 2055 New Bag/Given   ceFEPIme (MAXIPIME) 1 g in sodium chloride 0.9 % 100 mL IVPB 1 g 200 mL/hr   08/15/17 2103 Given   hydroxychloroquine (PLAQUENIL) tablet 200 mg 200 mg    08/16/17 2820 Given   hydroxychloroquine (PLAQUENIL) tablet 200 mg 200 mg    08/16/17 6015 New Bag/Given   ceFEPIme (MAXIPIME) 1 g in sodium chloride 0.9 % 100 mL IVPB 1 g 200 mL/hr   08/16/17 2030 New Bag/Given   ceFEPIme (MAXIPIME) 1 g in sodium chloride 0.9 % 100 mL IVPB 1 g 200 mL/hr   08/16/17 2047 Given   hydroxychloroquine (PLAQUENIL) tablet 200 mg 200 mg    08/17/17 0849 New Bag/Given   ceFEPIme (MAXIPIME) 1 g in sodium chloride 0.9 % 100 mL IVPB 1 g 200 mL/hr     . azaTHIOprine  50 mg Oral Daily   . docusate sodium  100 mg Oral BID  . feeding supplement (ENSURE ENLIVE)  237 mL Oral BID BM  . furosemide  20 mg Intravenous Q8H  . heparin  5,000 Units Subcutaneous Q8H  . HYDROcodone-acetaminophen      . hydroxychloroquine  200 mg Oral BID  . insulin aspart  0-5 Units Subcutaneous QHS  . insulin aspart  0-9 Units Subcutaneous TID WC  . metoprolol tartrate  75 mg Oral BID  . pantoprazole  40 mg Oral Daily  . potassium chloride  20 mEq Oral Daily  . predniSONE  5 mg Oral Q breakfast  . sodium chloride flush  10 mL Intracatheter Q0600  . venlafaxine XR  75 mg Oral TID  . vitamin B-12  1,000 mcg Oral Daily    Objective: Vital signs in last 24 hours: Temp:  [97.4 F (36.3 C)-98.7 F (37.1 C)] 98.2 F (36.8 C) (05/01 0959) Pulse Rate:  [85-104] 100 (05/01 1115) Resp:  [10-18] 10 (05/01 1115) BP: (95-141)/(54-77) 131/76 (05/01 1115) SpO2:  [89 %-100 %] 100 % (05/01 1115) Weight:  [69.5 kg (153 lb 3.5 oz)] 69.5 kg (153 lb 3.5 oz) (05/01 0430) Physical Exam  Constitutional:  oriented to person, place, and  time. Frail  HENT: [place]/AT, PERRLA, no scleral icterus Mouth/Throat: Oropharynx is clear and moist. No oropharyngeal exudate.  Cardiovascular: Normal rate, regular rhythm and normal heart sounds. P Ulmonary/Chest poor air movement, velcro crackles Neck = supple, no nuchal rigidity Abdominal: Soft. Bowel sounds are normal.  exhibits no distension. There is no tenderness. Biliary drains in place now externalized with bilous drainage Lymphadenopathy: no cervical adenopathy. No axillary adenopathy Neurological: alert and oriented to person, place, and time.  Skin: Skin is warm and dry. No rash noted. No erythema.  Psychiatric: a normal mood and affect.  behavior is normal.   Lab Results Recent Labs    08/14/17 2047 08/15/17 0611 08/16/17 0429 08/17/17 0613  WBC 11.9* 8.4  --   --   HGB 8.7* 8.3*  --   --   HCT 27.0* 24.4*  --   --   NA 133* 134* 132* 132*  K 3.7 3.3* 3.6  3.6  CL 98* 97* 93* 91*  CO2 27 31 33* 36*  BUN 20 16 18 19   CREATININE 0.66 0.65 0.76 0.74    Microbiology: Results for orders placed or performed during the hospital encounter of 08/12/17  Blood Culture (routine x 2)     Status: None   Collection Time: 08/12/17  7:04 PM  Result Value Ref Range Status   Specimen Description BLOOD RIGHT HAND  Final   Special Requests   Final    BOTTLES DRAWN AEROBIC AND ANAEROBIC Blood Culture adequate volume   Culture   Final    NO GROWTH 5 DAYS Performed at [facility], [address] [place], [place] [address]    Report Status 08/17/2017 FINAL  Final  Blood Culture (routine x 2)     Status: None   Collection Time: 08/12/17  7:05 PM  Result Value Ref Range Status   Specimen Description BLOOD RIGHT ANTECUBITAL  Final   Special Requests   Final    BOTTLES DRAWN AEROBIC AND ANAEROBIC Blood Culture results may not be optimal due to an excessive volume of blood received in culture bottles   Culture   Final    NO GROWTH 5 DAYS Performed at [facility], [address] [place], [place] [address]    Report Status 08/17/2017 FINAL  Final  MRSA PCR Screening     Status: None   Collection Time: 08/12/17 11:17 PM  Result Value Ref Range Status   MRSA by PCR NEGATIVE NEGATIVE Final    Comment:        The GeneXpert MRSA Assay (FDA approved for NASAL specimens only), is one component of a comprehensive MRSA colonization surveillance program. It is not intended to diagnose MRSA infection nor to guide or monitor treatment for MRSA infections. Performed at [facility], [address] [place], [place] [address]   Culture, blood (single) w Reflex to ID Panel     Status: None (Preliminary result)   Collection Time: 08/14/17  8:47 PM  Result Value Ref Range Status   Specimen Description BLOOD RIGHT ANTECUBITAL  Final   Special Requests   Final    BOTTLES DRAWN AEROBIC AND ANAEROBIC Blood Culture adequate volume    Culture   Final    NO GROWTH 3 DAYS Performed at [facility], [address] [place], [place] [address]    Report Status PENDING  Incomplete    Studies/Results: Ir Exchange Biliary Drain  Result Date: 08/17/2017 INDICATION: Cholangiocarcinoma with bilateral internal/external biliary drain catheters. Recurrent infections  and sepsis. Drainage change requested. EXAM: EXCHANGE OF BILATERAL INTERNAL/EXTERNAL BILIARY DRAIN CATHETERS (2) UNDER FLUOROSCOPY MEDICATIONS: Vancomycin 1 g; The antibiotic was administered within an appropriate time frame prior to the initiation of the procedure. ANESTHESIA/SEDATION: Intravenous Fentanyl and Versed were administered as conscious sedation during continuous monitoring of the patient's level of consciousness and physiological / cardiorespiratory status by the radiology RN, with a total moderate sedation time of 15 minutes. PROCEDURE: Informed written consent was obtained from the patient after a thorough discussion of the procedural risks, benefits and alternatives. All questions were addressed. Maximal Sterile Barrier Technique was utilized including caps, mask, sterile gowns, sterile gloves, sterile drape, hand hygiene and skin antiseptic. A timeout was performed prior to the initiation of the procedure. 1% lidocaine administered subcutaneously around both skin entry sites. The left catheter was injected with contrast under fluoroscopy, then cut and removed over a Bentson wire and a new 10.2 [place] multi side hole biliary drain catheter was advanced, positioned with sideholes spanning the level of biliary obstruction. Catheter injection confirms appropriate positioning and filling of intrahepatic ducts. In similar fashion, the right catheter was injected under fluoroscopy, then cut and exchanged over a wire for a new 10.2 [place] multi side hole biliary drain catheter, positioned with sideholes spanning the level of biliary obstruction. Catheter injection  confirms patency and filling of decompressed intrahepatic biliary ducts. The central aspect of both catheters are within the duodenum. The duodenum is decompressed. Both catheters were secured with 0 Prolene suture and placed to external gravity drain bags. The patient tolerated the procedure well. FLUOROSCOPY TIME:  1.2 minutes; 975 uGym2 DAP COMPLICATIONS: None immediate. IMPRESSION: 1. Technically successful exchange of left internal/external biliary drainage catheter under fluoroscopy. 2. Technically successful exchange of right internal/external biliary drainage catheter under fluoroscopy. The catheters can be capped to allow internal drainage after 24 hours if the patient remains stable. Electronically Signed   By: Regina Hood M.D.   On: 08/17/2017 10:53    Assessment/Plan: MAUDEAN HOFFMANN is a 77 y.o. 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 She is chornically ill appearing, hypoxic (has ILD).  CT chest abd pelvis neg except some edema in lungs on top of fibrosis changes.  At her last admission lfts only mildly elevated, cxr shows chronic changes, flu pcr negative, ua negative. [place] neg.  She does have a portacath in place but it is not tender or inflamed. CT abd negative. She improved with IV abx and was dced on oral cipro and flagyl.  So far she has had extensive evaluation for a source of sepsis with all imaging and cultures negative.  She was on chronic prednisone for several months but then this was stopped in April. Query adrenal insufficiency.  4/30 - cortisol level wnl. No futher fevers. For exchange of drains. 5/1 - drains exchanged today - externalized. No fevers.  Still not obvious what has been causing her recurrent fevers. Clinically improving but still quite weak.  Recommendations Cont 5 days of azithromycin for atypical coverage. Can stop cefepime tomorrow and dc only on oral  azithromycin Cont pred 5 mg under care of oncology. Thank you very much for the consult. Will follow with you.  Leonel Ramsay   08/17/2017, 2:35 PM

## 2017-08-17 NOTE — Progress Notes (Signed)
Nutrition Follow-up  DOCUMENTATION CODES:   Non-severe (moderate) malnutrition in context of chronic illness  INTERVENTION:  Continue Ensure Enlive po BID, each supplement provides 350 kcal and 20 grams of protein.  Also provide Magic cup TID with meals, each supplement provides 290 kcal and 9 grams of protein.  Encouraged adequate intake of calories and protein at meals.  NUTRITION DIAGNOSIS:   Moderate Malnutrition related to chronic illness(stage IV cholangiocarcinoma, COPD, lupus) as evidenced by mild fat depletion, mild muscle depletion, moderate muscle depletion.  Ongoing.  GOAL:   Patient will meet greater than or equal to 90% of their needs  Progressing.  MONITOR:   PO intake, Supplement acceptance, Labs, Weight trends, I & O's  REASON FOR ASSESSMENT:   Malnutrition Screening Tool    ASSESSMENT:   77 year old female with PMHx of diverticulitis, GI bleed, HTN, GERD, arthritis, COPD, DM, depression, UC, lupus, cholangiocarcinoma s/p biliary drain who is admitted with PNA.   -Patient s/p exchange of bilateral biliary drain catheters under fluoroscopy today.  Met with patient at bedside. She reports her appetite is still decreased. She is only eating 25% of her meals at most. She reports she does not like the food here (green beans are stringy, eggs are cold). She ordered salad for lunch and did not like that either. Reviewed menu with patient. Encouraged her to choose foods that are more calorie- and protein-dense since she is eating so little at her meals. She enjoys the Ensure and wants to keep drinking these. Also amenable to trying Magic Cup on trays.  Meal Completion: 0-25%  Medications reviewed and include: Colace, Lasix 20 mg Q8hrs IV, Novolog 0-9 units TID, Novolog 0-5 units QHS, pantoprazole, potassium chloride 20 mEq daily, prednisone 4 mg daily, vitamin B12 1000 micrograms daily, azithromycin, cefepime.  Labs reviewed: CBG 54-177, Sodium 132, Chloride 91,  CO2 36.  Diet Order:   Diet Order           Diet regular Room service appropriate? Yes; Fluid consistency: Thin  Diet effective now          EDUCATION NEEDS:   Education needs have been addressed  Skin:  Skin Assessment: Reviewed RN Assessment  Last BM:  08/14/2017 - small type 4  Height:   Ht Readings from Last 1 Encounters:  08/12/17 _0  (1.575 m)    Weight:   Wt Readings from Last 1 Encounters:  08/17/17 153 lb 3.5 oz (69.5 kg)    Ideal Body Weight:  50 kg  BMI:  Body mass index is 28.02 kg/m.  Estimated Nutritional Needs:   Kcal:  1515-1750 (MSJ x 1.3-1.5)  Protein:  85-105 grams (1.2-1.5 grams/kg)  Fluid:  1.8 L/day (25 mL/kg)  Willey Blade, MS, RD, LDN Office: [phone] Pager: [phone] After Hours/Weekend Pager: [phone]

## 2017-08-18 ENCOUNTER — Other Ambulatory Visit: Payer: Self-pay

## 2017-08-18 LAB — BASIC METABOLIC PANEL WITH GFR
Anion gap: 5 (ref 5–15)
BUN: 15 mg/dL (ref 6–20)
CO2: 37 mmol/L — ABNORMAL HIGH (ref 22–32)
Calcium: 8.1 mg/dL — ABNORMAL LOW (ref 8.9–10.3)
Chloride: 90 mmol/L — ABNORMAL LOW (ref 101–111)
Creatinine, Ser: 0.66 mg/dL (ref 0.44–1.00)
GFR calc Af Amer: >60 mL/min (ref >60)
GFR calc non Af Amer: >60 mL/min (ref >60)
Glucose, Bld: 87 mg/dL (ref 65–99)
Potassium: 3.7 mmol/L (ref 3.5–5.1)
Sodium: 132 mmol/L — ABNORMAL LOW (ref 135–145)

## 2017-08-18 LAB — GLUCOSE, CAPILLARY
Glucose-Capillary: 86 mg/dL (ref 65–99)
Glucose-Capillary: 155 mg/dL — ABNORMAL HIGH (ref 65–99)

## 2017-08-18 MED ORDER — ENSURE ENLIVE PO LIQD: 237.0000 mL | Freq: Two times a day (BID) | ORAL | 12 refills | Status: AC

## 2017-08-18 MED ORDER — AZITHROMYCIN 250 MG PO TABS: 250.0000 mg | ORAL_TABLET | Freq: Every day | ORAL | 0 refills | Status: AC

## 2017-08-18 MED ORDER — HEPARIN SOD (PORK) LOCK FLUSH 100 UNIT/ML IV SOLN
500.0000 [IU] | Freq: Once | INTRAVENOUS | Status: AC
  Administered 2017-08-18: 15:00:00 500 [IU] via INTRAVENOUS
  Filled 2017-08-18: qty 5

## 2017-08-18 MED ORDER — HYDROCODONE-ACETAMINOPHEN 5-325 MG PO TABS: 1.0000 | ORAL_TABLET | Freq: Four times a day (QID) | ORAL | 0 refills | Status: DC | PRN

## 2017-08-18 MED ORDER — PREDNISONE 5 MG PO TABS: 5.0000 mg | ORAL_TABLET | Freq: Every day | ORAL | 0 refills | Status: DC

## 2017-08-18 NOTE — Progress Notes (Signed)
Daily Progress Note   Patient Name: Regina Hood       Date: 08/18/2017 DOB: 1941/02/02  Age: 77 y.o. MRN#: [id number] Attending Physician: Vaughan Basta, * Primary Care Physician: Idelle Crouch, MD Admit Date: 08/12/2017  Reason for Consultation/Follow-up: Establishing goals of care  Subjective: Patient is lying in bed awake, alert and oriented x3. She denies pain or discomfort at this time. No family at bedside. She reports feeling better today, yesterday afternoon she had an episode of nausea and vomiting. She also had her biliary drains exchanged and states she feels better since that has been done as well. Patient verbalized plans is to hopefully be discharged home today and continue with palliative services. Her sons are aware and planning on coming up to see her or take her home when ready.   Chart reviewed and report received from bedside RN.   Length of Stay: 6  Current Medications: Scheduled Meds:  . azaTHIOprine  50 mg Oral Daily  . docusate sodium  100 mg Oral BID  . feeding supplement (ENSURE ENLIVE)  237 mL Oral BID BM  . furosemide  20 mg Intravenous Q8H  . heparin  5,000 Units Subcutaneous Q8H  . hydroxychloroquine  200 mg Oral BID  . insulin aspart  0-5 Units Subcutaneous QHS  . insulin aspart  0-9 Units Subcutaneous TID WC  . metoprolol tartrate  75 mg Oral BID  . pantoprazole  40 mg Oral Daily  . potassium chloride  20 mEq Oral Daily  . predniSONE  5 mg Oral Q breakfast  . senna-docusate  1 tablet Oral q1800  . sodium chloride flush  10 mL Intracatheter Q0600  . venlafaxine XR  75 mg Oral TID  . vitamin B-12  1,000 mcg Oral Daily    Continuous Infusions: . sodium chloride 75 mL/hr at 08/18/17 0604  . azithromycin Stopped (08/18/17 1034)  . ceFEPime  (MAXIPIME) IV Stopped (08/18/17 1104)    PRN Meds: acetaminophen **OR** acetaminophen, bisacodyl, HYDROcodone-acetaminophen, loperamide, LORazepam, ondansetron **OR** ondansetron (ZOFRAN) IV, sodium chloride flush, traMADol, traZODone  Physical Exam        Constitutional: She appears well-developed. She is cooperative.  Cardiovascular: Normal rate, regular rhythm, intact distal pulses and normal pulses.  Pulmonary/Chest: Effort normal. She has decreased breath sounds.  Abdominal: Bowel sounds are normal.  Biliary drains  Musculoskeletal:  Generalized weakness   Neurological: She is alert.  Psychiatric: She has a normal mood and affect. Her speech is normal and behavior is normal. Judgment and thought content normal. Cognition and memory are normal.  Nursing note and vitals reviewed.  Vital Signs: BP 118/63 (BP Location: Left Arm)   Pulse 92   Temp 97.6 F (36.4 C) (Oral)   Resp 18   Ht 5' 2"  (1.575 m)   Wt 71.9 kg (158 lb 8 oz)   SpO2 100%   BMI 28.99 kg/m  SpO2: SpO2: 100 % O2 Device: O2 Device: Nasal Cannula O2 Flow Rate: O2 Flow Rate (L/min): 2 L/min  Intake/output summary:   Intake/Output Summary (Last 24 hours) at 08/18/2017 1302 Last data filed at 08/18/2017 1104 Gross per 24 hour  Intake 2002.17 ml  Output 475 ml  Net 1527.17 ml   LBM: Last BM Date: 08/17/17 Baseline Weight: Weight: 68.5 kg (151 lb) Most recent weight: Weight: 71.9 kg (158 lb 8 oz)       Palliative Assessment/Data:40%   Flowsheet Rows     Most Recent Value  Intake Tab  Referral Department  Hospitalist  Unit at Time of Referral  Med/Surg Unit  Palliative Care Primary Diagnosis  Cancer  Date Notified  08/14/17  Palliative Care Type  New Palliative care  Reason for referral  Clarify Goals of Care, End of Life Care Assistance  Date of Admission  08/12/17  Date first seen by Palliative Care  08/15/17  # of days Palliative referral response time  1 Day(s)  # of days IP prior to Palliative  referral  2  Clinical Assessment  Psychosocial & Spiritual Assessment  Palliative Care Outcomes      Patient Active Problem List   Diagnosis Date Noted  . Cutaneous lupus erythematosus   . Malnutrition of moderate degree 06/09/2017  . Generalized abdominal pain   . Hyperbilirubinemia   . Hypomagnesemia   . Hypokalemia 04/27/2017  . Proteinuria 03/31/2017  . COPD exacerbation ([place]) 03/16/2017  . Dehydration 01/19/2017  . History of iron deficiency anemia 12/29/2016  . Systemic lupus erythematosus ([place]) 12/29/2016  . Cholangiocarcinoma ([place]) 12/29/2016  . History of ulcerative colitis 12/29/2016  . Goals of care, counseling/discussion 12/29/2016  . Severe sepsis ([place]) 11/13/2016  . At risk for sepsis 09/29/2016  . Red blood cell antibody positive, compatible PRBC difficult to obtain 09/22/2016  . Type 2 diabetes mellitus without complication, without long-term current use of insulin ([place]) 09/17/2016  . Liver mass, left lobe 08/23/2016  . Elevated liver enzymes 08/19/2016  . Pruritic rash 08/19/2016  . IDA (iron deficiency anemia) 07/18/2016  . Cellulitis of right upper extremity 07/05/2016  . History of rectal bleeding 07/05/2016  . Rectal bleeding 06/29/2016  . Hypertension 01/07/2016  . Anxiety and depression 09/26/2013  . DDD (degenerative disc disease) 09/26/2013  . Diabetes ([place]) 09/26/2013  . Sarcoidosis 09/26/2013  . UC (ulcerative colitis) ([place]) 09/26/2013  . Proximal humerus fracture, left, closed, initial encounter 09/17/2011    Palliative Care Assessment & Plan   Patient Profile: 77 y.o. female  admitted on 08/12/2017 from [facility] with altered mental status and fever. She has a past medical history of arthritis, hypertension,  interstitial lung disease (COPD), chronic respiratory failure on 2 L continuous oxygen at home, SLE, depression, diabetes, GERD, ulcerative colitis, and cholangiocarcinoma with biliary drains in place. On admission patient was  unable to provide history due to AMS. She was was noted to  have productive cough, shortness of breath and wheezing according to facility. She was just discharged from our hospital 2 days ago;due to possible sepsis and acute on chronic respiratory failure;she was treated and discharged on Cipro and Flagyl p.o. During her ED course she was tachycardic(HR 120s) and tachypneic (RR 27).Temperature was 100.1.Blood test were unremarkable for elevated lactic acid at 3.8 and troponin level at 0.05. Potassium level is 3.3. WBCs within normal limits. EKG is noted with sinus tachycardia but no acute ischemic changes. Chest x-ray,  showed bilateral pulmonary infiltrates and pulmonary vascular congestion. Since admission patient receiving antibiotics (cefepime and flagyl) for sepsis due to HCAP and electrolyte replacement. Palliative Medicine consulted for goals of care discussion.   Recommendations/Plan:  Continue DNR/DNI at patient's request  Continue to treat the treatable while hospitalized at patient's request.  Agree with and for patient to return home with home health PT and palliative services, which were currently in place prior to admission.  Plans for services were discussed with Santiago Glad, RN [facility]). Patient verbalized she is in need of walker, and this is being arranged.  Palliative Medicine team will continue to support patient, patient's family, and medical team during hospitalization.  Goals of Care and Additional Recommendations:  Limitations on Scope of Treatment: Full Scope Treatment continue to treat the treatable.   Code Status:    Code Status Orders  (From admission, onward)        Start     Ordered   08/12/17 2256  Do not attempt resuscitation (DNR)  Continuous    Question Answer Comment  In the event of cardiac or respiratory ARREST Do not call a "code blue"   In the event of cardiac or respiratory ARREST Do not perform Intubation, CPR, defibrillation or ACLS     In the event of cardiac or respiratory ARREST Use medication by any route, position, wound care, and other measures to relive pain and suffering. May use oxygen, suction and manual treatment of airway obstruction as needed for comfort.      08/12/17 2256       09/17/2011 1443 09/19/2011 1624 Full Code [id number]  Prudencio Pair Rudder, RN Inpatient    Advance Directive Documentation     Most Recent Value  Type of Advance Directive  Healthcare Power of Attorney, Out of facility DNR (pink MOST or yellow form)  Pre-existing out of facility DNR order (yellow form or pink MOST form)  Yellow form placed in chart (order not valid for inpatient use)  "MOST" Form in Place?  -     Prognosis:   Unable to determine-guarded to poor in the setting of cholangiocarcinoma, poor p.o. intake, decreased mobility, COPD, chronic home use of oxygen, generalized weakness, recurrent pneumonia/sepsis, and SLE.   Discharge Planning:  Home with Home Health and palliative services.   Care plan was discussed with patient, Case Manager, Santiago Glad, RN [facility] Liaison), and bedside RN.   Thank you for allowing the Palliative Medicine Team to assist in the care of this patient.   Total Time 30 min.  Prolonged Time Billed No      Greater than 50%  of this time was spent counseling and coordinating care related to the above assessment and plan.  Alda Lea, NP  Please contact Palliative Medicine Team phone at [phone] for questions and concerns.

## 2017-08-18 NOTE — Progress Notes (Signed)
[facility] NAME: Regina Hood    MR#:  [id number]  DATE OF BIRTH:  Apr 30, 1940  SUBJECTIVE:   Patient feeling better this morning.  Ambulated with physical therapy and did well. No fevers overnight  changed her biliary drainage tubes. REVIEW OF SYSTEMS:    Review of Systems  Constitutional: Negative for fever, chills weight loss this morning HENT: Negative for ear pain, nosebleeds, congestion, facial swelling, rhinorrhea, neck pain, neck stiffness and ear discharge.   Respiratory: Negative for cough, shortness of breath, wheezing  Cardiovascular: Negative for chest pain, palpitations and leg swelling.  Gastrointestinal: Negative for heartburn, abdominal pain, vomiting, diarrhea or consitpation Genitourinary: Negative for dysuria, urgency, frequency, hematuria Musculoskeletal: Negative for back pain or joint pain Neurological: Negative for dizziness, seizures, syncope, focal weakness,  numbness and headaches.  Hematological: Does not bruise/bleed easily.  Psychiatric/Behavioral: Negative for hallucinations, confusion, dysphoric mood  Tolerating Diet: yes    DRUG ALLERGIES:   Allergies  Allergen Reactions  . Penicillins Nausea Only    VITALS:  Blood pressure 118/63, pulse 92, temperature 97.6 F (36.4 C), temperature source Oral, resp. rate 18, height 5' 2"  (1.575 m), weight 71.9 kg (158 lb 8 oz), SpO2 100 %.  PHYSICAL EXAMINATION:  Constitutional: Appears well-developed and well-nourished. No distress. HENT: Normocephalic. Marland Kitchen Oropharynx is clear and moist.  Eyes: Conjunctivae and EOM are normal. PERRLA, no scleral icterus.  Neck: Normal ROM. Neck supple. No JVD. No tracheal deviation. CVS: RRR, S1/S2 +, no murmurs, no gallops, no carotid bruit.  Pulmonary: Effort and breath sounds normal, no stridor, rhonchi, wheezes, rales.  Abdominal: Soft. BS +, nondistended abdomen with 2 biliary drains present Biliary drain is clean  and intact Musculoskeletal: Normal range of motion. No edema and no tenderness.  Neuro: Alert. CN 2-12 grossly intact. No focal deficits. Skin: Skin is warm and dry. No rash noted. Psychiatric: Normal mood and affect.    LABORATORY PANEL:   CBC Recent Labs  Lab 08/15/17 0611  WBC 8.4  HGB 8.3*  HCT 24.4*  PLT 183   ------------------------------------------------------------------------------------------------------------------  Chemistries  Recent Labs  Lab 08/14/17 2047  08/18/17 0603  NA 133*   < > 132*  K 3.7   < > 3.7  CL 98*   < > 90*  CO2 27   < > 37*  GLUCOSE 193*   < > 87  BUN 20   < > 15  CREATININE 0.66   < > 0.66  CALCIUM 8.3*   < > 8.1*  AST 41  --   --   ALT 18  --   --   ALKPHOS 274*  --   --   BILITOT 0.7  --   --    < > = values in this interval not displayed.   ------------------------------------------------------------------------------------------------------------------  Cardiac Enzymes Recent Labs  Lab 08/12/17 1904 08/13/17 0607  TROPONINI 0.05* 0.03*   ------------------------------------------------------------------------------------------------------------------  RADIOLOGY:  Ir Exchange Biliary Drain  Result Date: 08/17/2017 INDICATION: Cholangiocarcinoma with bilateral internal/external biliary drain catheters. Recurrent infections and sepsis. Drainage change requested. EXAM: EXCHANGE OF BILATERAL INTERNAL/EXTERNAL BILIARY DRAIN CATHETERS (2) UNDER FLUOROSCOPY MEDICATIONS: Vancomycin 1 g; The antibiotic was administered within an appropriate time frame prior to the initiation of the procedure. ANESTHESIA/SEDATION: Intravenous Fentanyl and Versed were administered as conscious sedation during continuous monitoring of the patient's level of consciousness and physiological / cardiorespiratory status by the radiology RN, with a total moderate sedation time of 15 minutes.  PROCEDURE: Informed written consent was obtained from the patient  after a thorough discussion of the procedural risks, benefits and alternatives. All questions were addressed. Maximal Sterile Barrier Technique was utilized including caps, mask, sterile gowns, sterile gloves, sterile drape, hand hygiene and skin antiseptic. A timeout was performed prior to the initiation of the procedure. 1% lidocaine administered subcutaneously around both skin entry sites. The left catheter was injected with contrast under fluoroscopy, then cut and removed over a Bentson wire and a new 10.2 [place] multi side hole biliary drain catheter was advanced, positioned with sideholes spanning the level of biliary obstruction. Catheter injection confirms appropriate positioning and filling of intrahepatic ducts. In similar fashion, the right catheter was injected under fluoroscopy, then cut and exchanged over a wire for a new 10.2 [place] multi side hole biliary drain catheter, positioned with sideholes spanning the level of biliary obstruction. Catheter injection confirms patency and filling of decompressed intrahepatic biliary ducts. The central aspect of both catheters are within the duodenum. The duodenum is decompressed. Both catheters were secured with 0 Prolene suture and placed to external gravity drain bags. The patient tolerated the procedure well. FLUOROSCOPY TIME:  1.2 minutes; 740 uGym2 DAP COMPLICATIONS: None immediate. IMPRESSION: 1. Technically successful exchange of left internal/external biliary drainage catheter under fluoroscopy. 2. Technically successful exchange of right internal/external biliary drainage catheter under fluoroscopy. The catheters can be capped to allow internal drainage after 24 hours if the patient remains stable. Electronically Signed   By: Lucrezia Europe M.D.   On: 08/17/2017 10:53     ASSESSMENT AND PLAN:  77 year old female with cholangiocarcinoma status post biliary drain, interstitial lung disease and chronic hypoxic respiratory failure on 2 L of oxygen who  presents from [facility] due to altered mental status and fever.  1.  Acute metabolic encephalopathy in the setting of sepsis due to HCAP Patient is at her baseline.  2.  Sepsis due to HCAP with chronic hypoxic respiratory failure on 2 liters of oxygen: Patient presented with tachycardia, tachypnea and fever of 100.1  CT of the abdomen and pelvis were essentially unremarkable.  Chest x-ray also essentially unremarkable.  Patient was seen in consultation by Ola Spurr given recent fever 2 days ago. Accommodations were to try to obtain sputum culture.  A.m. cortisol level was 11.2 which is within normal limits.  He is recommending to discontinue Flagyl and add azithromycin for atypical coverage.  She will continue on cefepime. Biliary drainage tubes exchanged and externalized 08/17/17  3.  HCAP: Continue cefepime and azithromycin.  MRSA PCR is negative  4.  Elevated troponin: Patient has ruled out for ACS.  5.  Hyponatremia with chronic hyponatremia: Sodium level has been relatively stable 6.  Hypokalemia: This will be repleted. 7.  Anemia of chronic disease: Monitor hemoglobin  8.  Cholangiocarcinoma status post biliary drains: Patient was recently discharged with Flagyl and Cipro as recommended by ID consultant.   Continue cefepime. Obtain oncology consultation  9.  History of sarcoidosis/pulmonary fibrosis: Continue Plaquenil  10.  Diabetes: Continue sliding scale and ADA diet  11.  SLE: Patient will continue on Imuran 12.  Essential hypertension: Continue metoprolol    D/w dr fitzgerald  Patient will be discharged home with home health when medically stable.  Management plans discussed with the patient and she is in agreement.  CODE STATUS: DNR  TOTAL TIME TAKING CARE OF THIS PATIENT: 30 minutes.     POSSIBLE D/C in the next 1 to 2 days DEPENDING ON CLINICAL  CONDITION.   Vaughan Basta M.D on 08/18/2017 at 7:29 AM  Between 7am to 6pm - Pager -  [phone] After 6pm go to [email or web address] - password [email or web address] [facility]  Office  [phone]  CC: Primary care physician; Idelle Crouch, MD  Note: This dictation was prepared with Dragon dictation along with smaller phrase technology. Any transcriptional errors that result from this process are unintentional.

## 2017-08-18 NOTE — Progress Notes (Signed)
Physical Therapy Treatment Patient Details Name: Regina Hood MRN: [id number] DOB: Oct 04, 1940 Today's Date: 08/18/2017    History of Present Illness 77 yo female with onset of pulm edema and PNA with metabolic encephalopathy was admitted yesterday.  Has flat troponins, cleared for MI, noted cardiac enlargement and has malnutrition. PMHx:  Cholangiocarcinoma with biliary drains, sarcoidosis, SLE, DM,  COPD exacerbation, colitis, rectal bleeding,     PT Comments    Pt not with 2 abdominal drains.  Pt voiced concerns over going home.  Pt was able to transition out of bed and walk around unit with walker modified independent/supervision.  Overall she did well without LOB or excessive fatigue.  Pt with increased confidence after session.  Stated her son can stay with her tonight.  Requests rolling walker and 3-in-1 commode for home.  Educated on drain management.  Pt felt better after session with her mobility skills.   Follow Up Recommendations  Home health PT     Equipment Recommendations  Rolling walker with 5" wheels;3in1 (PT)    Recommendations for Other Services       Precautions / Restrictions Precautions Precautions: Fall Precaution Comments: abdominal drains - bialterally Restrictions Weight Bearing Restrictions: No    Mobility  Bed Mobility Overal bed mobility: Modified Independent                Transfers Overall transfer level: Modified independent                  Ambulation/Gait Ambulation/Gait assistance: Modified independent (Device/Increase time);Supervision   Assistive device: Rolling walker (2 wheeled) Gait Pattern/deviations: Step-through pattern Gait velocity: reduced       Stairs             Wheelchair Mobility    Modified Rankin (Stroke Patients Only)       Balance Overall balance assessment: Modified Independent                                          Cognition Arousal/Alertness:  Awake/alert Behavior During Therapy: WFL for tasks assessed/performed Overall Cognitive Status: Within Functional Limits for tasks assessed                                        Exercises      General Comments        Pertinent Vitals/Pain Pain Assessment: No/denies pain    Home Living                      Prior Function            PT Goals (current goals can now be found in the care plan section) Progress towards PT goals: Progressing toward goals    Frequency    Min 2X/week      PT Plan Current plan remains appropriate    Co-evaluation              AM-PAC PT "6 Clicks" Daily Activity  Outcome Measure  Difficulty turning over in bed (including adjusting bedclothes, sheets and blankets)?: None Difficulty moving from lying on back to sitting on the side of the bed? : None Difficulty sitting down on and standing up from a chair with arms (e.g., wheelchair, bedside commode, etc,.)?: None Help needed moving to and from  a bed to chair (including a wheelchair)?: None Help needed walking in hospital room?: None Help needed climbing 3-5 steps with a railing? : A Little 6 Click Score: 23    End of Session Equipment Utilized During Treatment: Oxygen;Other (comment) Activity Tolerance: Patient tolerated treatment well Patient left: in chair;with chair alarm set;with call bell/phone within reach Nurse Communication: Mobility status;Other (comment)       Time: 7824-2353 PT Time Calculation (min) (ACUTE ONLY): 13 min  Charges:  $Gait Training: 8-22 mins                    G Codes:       Chesley Noon, PTA 08/18/17, 11:22 AM

## 2017-08-18 NOTE — Progress Notes (Signed)
Pharmacy Electrolyte Monitoring Consult:  Pharmacy consulted to assist in monitoring and replacing electrolytes in this 77 y.o. female admitted on 08/12/2017 with Altered Mental Status and Fever  Labs:  Sodium (mmol/L)  Date Value  08/18/2017 132 (L)   Potassium (mmol/L)  Date Value  08/18/2017 3.7   Magnesium (mg/dL)  Date Value  08/03/2017 1.8   Phosphorus (mg/dL)  Date Value  05/05/2017 3.3   Calcium (mg/dL)  Date Value  08/18/2017 8.1 (L)   Albumin (g/dL)  Date Value  08/14/2017 2.6 (L)    Plan: Patient tolerating oral medications, unless clinically necessary patient should receive oral replacement.   No additional supplementation needed at this time.  Will recheck electrolytes in 48 hours.   Pharmacy will continue to monitor and adjust per consult.   Pernell Dupre, PharmD, BCPS Clinical Pharmacist 08/18/2017 9:25 AM

## 2017-08-18 NOTE — Care Management (Signed)
Received order for home health, rolling walker and BSC with [facility]

## 2017-08-18 NOTE — Consult Note (Addendum)
   [facility] CM Inpatient Consult   08/18/2017  Regina Hood 01/02/1941 [id number]   [facility] Management follow up.  Spoke with Regina Hood about [place] Management program services. Regina Hood is agreeable and verbal consent obtained.   Regina Hood lives alone. States her son takes her to MD appointments if needed. Patient endorses SNF was denied by insurance. Confirmed Primary Care MD is Regina Hood [facility] listed as doing transition of care call) Best contact number for Mrs. Pro is [phone] Denies having concerns with obtaining medications.   Spoke with inpatient RNCM [facility]). Confirmed that discharge plan is home with outpatient palliative services thru [facility] . Noted home health will be arranged as well with [facility]. Made inpatient RNCM aware [facility] Care Management will follow.  Will make referral to [facility] for COPD.   Marthenia Rolling, MSN-Ed, RN,BSN [facility] Liaison [phone]

## 2017-08-18 NOTE — Progress Notes (Signed)
Patient discharged home per MD order. All discharge instructions given and all questions answered. Prescriptions given to patient.

## 2017-08-18 NOTE — Discharge Summary (Signed)
[facility] NAME: Regina Hood    MR#:  [id number]  DATE OF BIRTH:  [birth year]  DATE OF ADMISSION:  08/12/2017 ADMITTING PHYSICIAN: Amelia Jo, MD  DATE OF DISCHARGE: 08/18/2017  PRIMARY CARE PHYSICIAN: Idelle Crouch, MD    ADMISSION DIAGNOSIS:  COPD exacerbation ([place]) [J44.1] Severe sepsis ([place]) [A41.9, R65.20] Pneumonia of both lower lobes due to infectious organism ([place]) [J18.1]  DISCHARGE DIAGNOSIS:  Active Problems:   Severe sepsis ([place])   Cutaneous lupus erythematosus   SECONDARY DIAGNOSIS:   Past Medical History:  Diagnosis Date  . Arthritis   . Cancer ([place])   . Cellulitis   . Collagen vascular disease ([place])   . COPD (chronic obstructive pulmonary disease) ([place])   . DDD (degenerative disc disease), lumbar   . Depression   . Diabetes mellitus   . Diverticulitis   . GERD (gastroesophageal reflux disease)   . GI bleed   . Headache(784.0)   . Hypertension   . Lupus ([place])   . Ulcerative colitis [facility])     HOSPITAL COURSE:   77 year old female with cholangiocarcinoma status post biliary drain, interstitial lung disease and chronic hypoxic respiratory failure on 2 L of oxygen who presents from [facility] due to altered mental status and fever.  1.  Acute metabolic encephalopathy in the setting of sepsis due to HCAP Patient is at her baseline.  2.  Sepsis due to HCAP with chronic hypoxic respiratory failure on 2 liters of oxygen: Patient presented with tachycardia, tachypnea and fever of 100.1  CT of the abdomen and pelvis were essentially unremarkable.  Chest x-ray also essentially unremarkable.  Patient was seen in consultation by Ola Spurr given recent fever 2 days ago. Accommodations were to try to obtain sputum culture.  A.m. cortisol level was 11.2 which is within normal limits.  He is recommending to discontinue Flagyl and add azithromycin for atypical coverage.  She will continue on  cefepime.  Biliary drainage tubes exchanged and externalized 08/17/17  Dr. Ola Spurr suggested to give oral azithromycine to finish course for pneumonia.  3.  HCAP: Continue cefepime and azithromycin.  MRSA PCR is negative  4.  Elevated troponin: Patient has ruled out for ACS.  5.  Hyponatremia with chronic hyponatremia: Sodium level has been relatively stable 6.  Hypokalemia: This will be repleted. 7.  Anemia of chronic disease: Monitor hemoglobin  8.  Cholangiocarcinoma status post biliary drains: Patient was recently discharged with Flagyl and Cipro as recommended by ID consultant.   Continue cefepime. Obtain oncology consultation  9.  History of sarcoidosis/pulmonary fibrosis: Continue Plaquenil  10.  Diabetes: Continue sliding scale and ADA diet  11.  SLE: Patient will continue on Imuran 12.  Essential hypertension: Continue metoprolol  Arranging for Home health Nurse and Aid. DISCHARGE CONDITIONS:   Stable.  CONSULTS OBTAINED:  Treatment Team:  Leonel Ramsay, MD Earlie Server, MD  DRUG ALLERGIES:   Allergies  Allergen Reactions  . Penicillins Nausea Only    DISCHARGE MEDICATIONS:   Allergies as of 08/18/2017      Reactions   Penicillins Nausea Only      Medication List    STOP taking these medications   ciprofloxacin 500 MG tablet Commonly known as:  CIPRO   insulin glargine 100 UNIT/ML injection Commonly known as:  LANTUS   metFORMIN 500 MG tablet Commonly known as:  GLUCOPHAGE   Metoprolol Tartrate 75 MG Tabs   metroNIDAZOLE 500 MG tablet Commonly  known as:  FLAGYL     TAKE these medications   acetaminophen 500 MG tablet Commonly known as:  TYLENOL Take 1,000 mg by mouth 3 (three) times daily.   azaTHIOprine 50 MG tablet Commonly known as:  IMURAN Take 50 mg by mouth daily.   azithromycin 250 MG tablet Commonly known as:  ZITHROMAX Take 1 tablet (250 mg total) by mouth daily for 3 days.   diphenoxylate-atropine 2.5-0.025 MG  tablet Commonly known as:  LOMOTIL Take 2 tablets by mouth 4 (four) times daily as needed for diarrhea or loose stools.   feeding supplement (ENSURE ENLIVE) Liqd Take 237 mLs by mouth 2 (two) times daily between meals.   Fluticasone-Salmeterol 100-50 MCG/DOSE Aepb Commonly known as:  ADVAIR DISKUS Inhale 1 puff into the lungs 2 (two) times daily.   HYDROcodone-acetaminophen 5-325 MG tablet Commonly known as:  NORCO/VICODIN Take 1 tablet by mouth every 6 (six) hours as needed for severe pain.   lidocaine-prilocaine cream Commonly known as:  EMLA Apply 1 application topically as needed.   magnesium chloride 64 MG Tbec SR tablet Commonly known as:  SLOW-MAG Take 1 tablet (64 mg total) by mouth daily.   Normal Saline Flush 0.9 % Soln 1 mL by Other route every 8 (eight) hours. Flush each biliary drain with 1 mL twice daily   ondansetron 8 MG tablet Commonly known as:  ZOFRAN TAKE ONE TABLET EVERY 12 HOURS AS NEEDED What changed:  See the new instructions.   pantoprazole 40 MG tablet Commonly known as:  PROTONIX Take 1 tablet (40 mg total) by mouth daily.   PLAQUENIL 200 MG tablet Generic drug:  hydroxychloroquine Take 200 mg by mouth 2 (two) times daily.   predniSONE 5 MG tablet Commonly known as:  DELTASONE Take 1 tablet (5 mg total) by mouth daily with breakfast. Start taking on:  08/19/2017   triamcinolone cream 0.1 % Commonly known as:  KENALOG Apply 1 application topically daily.   venlafaxine XR 75 MG 24 hr capsule Commonly known as:  EFFEXOR-XR Take 75 mg by mouth daily.   VITAMIN B-12 PO Take 1,000 mcg by mouth daily.            Durable Medical Equipment  (From admission, onward)        Start     Ordered   08/18/17 1425  For home use only DME Walker rolling  Once    Question:  Patient needs a walker to treat with the following condition  Answer:  Weakness   08/18/17 1425   08/18/17 1425  For home use only DME Bedside commode  Once    Question:   Patient needs a bedside commode to treat with the following condition  Answer:  Weakness   08/18/17 1425       DISCHARGE INSTRUCTIONS:    Follow with PMD in 1-2 weeks.  If you experience worsening of your admission symptoms, develop shortness of breath, life threatening emergency, suicidal or homicidal thoughts you must seek medical attention immediately by calling 911 or calling your MD immediately  if symptoms less severe.  You Must read complete instructions/literature along with all the possible adverse reactions/side effects for all the Medicines you take and that have been prescribed to you. Take any new Medicines after you have completely understood and accept all the possible adverse reactions/side effects.   Please note  You were cared for by a hospitalist during your hospital stay. If you have any questions about your discharge medications or  the care you received while you were in the hospital after you are discharged, you can call the unit and asked to speak with the hospitalist on call if the hospitalist that took care of you is not available. Once you are discharged, your primary care physician will handle any further medical issues. Please note that NO REFILLS for any discharge medications will be authorized once you are discharged, as it is imperative that you return to your primary care physician (or establish a relationship with a primary care physician if you do not have one) for your aftercare needs so that they can reassess your need for medications and monitor your lab values.    Today   CHIEF COMPLAINT:   Chief Complaint  Patient presents with  . Altered Mental Status  . Fever    HISTORY OF PRESENT ILLNESS:  Regina Hood  is a 77 y.o. female with a known history of interstitial lung disease, chronic respiratory failure on 2 L continuous oxygen at home, hypertension, SLE, cholangiocarcinoma with biliary drain in place. Patient is not able to provide history due to  AMS.  Most of the information was obtained from reviewing medical records and from discussion with emergency room physician.  Patient was transferred from [facility] due to altered mental status and fever.  She was noted with productive cough, shortness of breath and wheezing.  She was just discharged from our hospital 2 days ago; patient was treated for possible sepsis and acute on chronic respiratory failure; discharged on Cipro and Flagyl p.o. At the arrival to emergency room patient was tachycardic with heart rate in the 120s and tachypneic with respiratory rate 27.  Temperature was 100.1.  Blood test done in the emergency room unremarkable for elevated lactic acid at 3.8 and troponin level at 0.05.  Potassium level is 3.3.  WBCs within normal limits. EKG is noted with sinus tachycardia but no acute ischemic changes. Chest x-ray, reviewed by myself, shows bilateral pulmonary infiltrates and pulmonary vascular congestion. Patient is admitted for further evaluation and treatment.   VITAL SIGNS:  Blood pressure 118/63, pulse 92, temperature 97.6 F (36.4 C), temperature source Oral, resp. rate 18, height 5' 2"  (1.575 m), weight 71.9 kg (158 lb 8 oz), SpO2 100 %.  I/O:    Intake/Output Summary (Last 24 hours) at 08/18/2017 1455 Last data filed at 08/18/2017 1446 Gross per 24 hour  Intake 2002.17 ml  Output 925 ml  Net 1077.17 ml    PHYSICAL EXAMINATION:   Constitutional: Appears well-developed and well-nourished. No distress. HENT: Normocephalic. Marland Kitchen Oropharynx is clear and moist.  Eyes: Conjunctivae and EOM are normal. PERRLA, no scleral icterus.  Neck: Normal ROM. Neck supple. No JVD. No tracheal deviation. CVS: RRR, S1/S2 +, no murmurs, no gallops, no carotid bruit.  Pulmonary: Effort and breath sounds normal, no stridor, rhonchi, wheezes, rales.  Abdominal: Soft. BS +, nondistended abdomen with 2 biliary drains present Biliary drain is clean and intact Musculoskeletal: Normal  range of motion. No edema and no tenderness.  Neuro: Alert. CN 2-12 grossly intact. No focal deficits. Skin: Skin is warm and dry. No rash noted. Psychiatric: Normal mood and affect.     DATA REVIEW:   CBC Recent Labs  Lab 08/15/17 0611  WBC 8.4  HGB 8.3*  HCT 24.4*  PLT 183    Chemistries  Recent Labs  Lab 08/14/17 2047  08/18/17 0603  NA 133*   < > 132*  K 3.7   < > 3.7  CL 98*   < > 90*  CO2 27   < > 37*  GLUCOSE 193*   < > 87  BUN 20   < > 15  CREATININE 0.66   < > 0.66  CALCIUM 8.3*   < > 8.1*  AST 41  --   --   ALT 18  --   --   ALKPHOS 274*  --   --   BILITOT 0.7  --   --    < > = values in this interval not displayed.    Cardiac Enzymes Recent Labs  Lab 08/13/17 0607  TROPONINI 0.03*    Microbiology Results  Results for orders placed or performed during the hospital encounter of 08/12/17  Blood Culture (routine x 2)     Status: None   Collection Time: 08/12/17  7:04 PM  Result Value Ref Range Status   Specimen Description BLOOD RIGHT HAND  Final   Special Requests   Final    BOTTLES DRAWN AEROBIC AND ANAEROBIC Blood Culture adequate volume   Culture   Final    NO GROWTH 5 DAYS Performed at [facility], [address] [place], [place] [address]    Report Status 08/17/2017 FINAL  Final  Blood Culture (routine x 2)     Status: None   Collection Time: 08/12/17  7:05 PM  Result Value Ref Range Status   Specimen Description BLOOD RIGHT ANTECUBITAL  Final   Special Requests   Final    BOTTLES DRAWN AEROBIC AND ANAEROBIC Blood Culture results may not be optimal due to an excessive volume of blood received in culture bottles   Culture   Final    NO GROWTH 5 DAYS Performed at [facility], [address] [place], [place] [address]    Report Status 08/17/2017 FINAL  Final  MRSA PCR Screening     Status: None   Collection Time: 08/12/17 11:17 PM  Result Value Ref Range Status   MRSA by PCR NEGATIVE NEGATIVE Final    Comment:         The GeneXpert MRSA Assay (FDA approved for NASAL specimens only), is one component of a comprehensive MRSA colonization surveillance program. It is not intended to diagnose MRSA infection nor to guide or monitor treatment for MRSA infections. Performed at [facility], [address] [place], [place] [address]   Culture, blood (single) w Reflex to ID Panel     Status: None (Preliminary result)   Collection Time: 08/14/17  8:47 PM  Result Value Ref Range Status   Specimen Description BLOOD RIGHT ANTECUBITAL  Final   Special Requests   Final    BOTTLES DRAWN AEROBIC AND ANAEROBIC Blood Culture adequate volume   Culture   Final    NO GROWTH 4 DAYS Performed at [facility], [address] [place],  [address]    Report Status PENDING  Incomplete    RADIOLOGY:  Ir Exchange Biliary Drain  Result Date: 08/17/2017 INDICATION: Cholangiocarcinoma with bilateral internal/external biliary drain catheters. Recurrent infections and sepsis. Drainage change requested. EXAM: EXCHANGE OF BILATERAL INTERNAL/EXTERNAL BILIARY DRAIN CATHETERS (2) UNDER FLUOROSCOPY MEDICATIONS: Vancomycin 1 g; The antibiotic was administered within an appropriate time frame prior to the initiation of the procedure. ANESTHESIA/SEDATION: Intravenous Fentanyl and Versed were administered as conscious sedation during continuous monitoring of the patient's level of consciousness and physiological / cardiorespiratory status by the radiology RN, with a total moderate sedation time of 15 minutes. PROCEDURE: Informed written  consent was obtained from the patient after a thorough discussion of the procedural risks, benefits and alternatives. All questions were addressed. Maximal Sterile Barrier Technique was utilized including caps, mask, sterile gowns, sterile gloves, sterile drape, hand hygiene and skin antiseptic. A timeout was performed prior to the initiation of the procedure. 1% lidocaine  administered subcutaneously around both skin entry sites. The left catheter was injected with contrast under fluoroscopy, then cut and removed over a Bentson wire and a new 10.2 [place] multi side hole biliary drain catheter was advanced, positioned with sideholes spanning the level of biliary obstruction. Catheter injection confirms appropriate positioning and filling of intrahepatic ducts. In similar fashion, the right catheter was injected under fluoroscopy, then cut and exchanged over a wire for a new 10.2 [place] multi side hole biliary drain catheter, positioned with sideholes spanning the level of biliary obstruction. Catheter injection confirms patency and filling of decompressed intrahepatic biliary ducts. The central aspect of both catheters are within the duodenum. The duodenum is decompressed. Both catheters were secured with 0 Prolene suture and placed to external gravity drain bags. The patient tolerated the procedure well. FLUOROSCOPY TIME:  1.2 minutes; 681 uGym2 DAP COMPLICATIONS: None immediate. IMPRESSION: 1. Technically successful exchange of left internal/external biliary drainage catheter under fluoroscopy. 2. Technically successful exchange of right internal/external biliary drainage catheter under fluoroscopy. The catheters can be capped to allow internal drainage after 24 hours if the patient remains stable. Electronically Signed   By: Lucrezia Europe M.D.   On: 08/17/2017 10:53    EKG:   Orders placed or performed during the hospital encounter of 08/12/17  . ED EKG 12-Lead  . ED EKG 12-Lead      Management plans discussed with the patient, family and they are in agreement.  CODE STATUS:     Code Status Orders  (From admission, onward)        Start     Ordered   08/12/17 2256  Do not attempt resuscitation (DNR)  Continuous    Question Answer Comment  In the event of cardiac or respiratory ARREST Do not call a "code blue"   In the event of cardiac or respiratory ARREST Do  not perform Intubation, CPR, defibrillation or ACLS   In the event of cardiac or respiratory ARREST Use medication by any route, position, wound care, and other measures to relive pain and suffering. May use oxygen, suction and manual treatment of airway obstruction as needed for comfort.      08/12/17 2256    Code Status History    Date Active Date Inactive Code Status Order ID Comments User Context   08/07/2017 0233 08/10/2017 2140 DNR [id number]  Lance Coon, MD ED   06/07/2017 1308 06/09/2017 1715 DNR [id number]  Max Sane, MD Inpatient   06/07/2017 1200 06/07/2017 1308 Full Code [id number]  Max Sane, MD Inpatient   05/04/2017 1612 05/05/2017 2249 DNR [id number]  Bettey Costa, MD Inpatient   05/04/2017 1546 05/04/2017 1612 Full Code [id number]  Bettey Costa, MD Inpatient   04/27/2017 1826 04/30/2017 2200 DNR [id number]  Vaughan Basta, MD Inpatient   03/16/2017 1712 03/17/2017 1608 DNR [id number]  Henreitta Leber, MD Inpatient   11/13/2016 0301 11/14/2016 1412 DNR [id number]  Harrie Foreman, MD Inpatient   06/29/2016 1533 07/01/2016 1922 DNR [id number]  Hillary Bow, MD ED   09/17/2011 1443 09/19/2011 1624 Full Code [id number]  Corum, Jeani Sow, RN Inpatient    Advance Directive Documentation     Most Recent Value  Type of Social research officer, government, Out of facility DNR (pink MOST or yellow form)  Pre-existing out of facility DNR order (yellow form or pink MOST form)  Yellow form placed in chart (order not valid for inpatient use)  "MOST" Form in Place?  -      TOTAL TIME TAKING CARE OF THIS PATIENT: 35 minutes.    Vaughan Basta M.D on 08/18/2017 at 2:55 PM  Between 7am to 6pm - Pager - [phone]  After 6pm go to [email or web address] - password [email or web address] [place] [facility]  Office  [phone]  CC: Primary care physician; Idelle Crouch, MD   Note: This dictation was prepared with Dragon dictation along with smaller phrase technology. Any  transcriptional errors that result from this process are unintentional.

## 2017-08-18 NOTE — Care Management Important Message (Signed)
Important Message  Patient Details  Name: Regina Hood MRN: [id number] Date of Birth: 1941-01-10   Medicare Important Message Given:  Yes    [name] A Napolean Sia 08/18/2017, 11:16 AM

## 2017-08-19 ENCOUNTER — Other Ambulatory Visit: Payer: Self-pay | Admitting: *Deleted

## 2017-08-19 ENCOUNTER — Encounter: Payer: Self-pay | Admitting: *Deleted

## 2017-08-19 DIAGNOSIS — K219 Gastro-esophageal reflux disease without esophagitis: Secondary | ICD-10-CM | POA: Diagnosis not present

## 2017-08-19 DIAGNOSIS — J440 Chronic obstructive pulmonary disease with acute lower respiratory infection: Secondary | ICD-10-CM | POA: Diagnosis not present

## 2017-08-19 DIAGNOSIS — Z792 Long term (current) use of antibiotics: Secondary | ICD-10-CM | POA: Diagnosis not present

## 2017-08-19 DIAGNOSIS — Z7952 Long term (current) use of systemic steroids: Secondary | ICD-10-CM | POA: Diagnosis not present

## 2017-08-19 DIAGNOSIS — Z79891 Long term (current) use of opiate analgesic: Secondary | ICD-10-CM | POA: Diagnosis not present

## 2017-08-19 DIAGNOSIS — F329 Major depressive disorder, single episode, unspecified: Secondary | ICD-10-CM | POA: Diagnosis not present

## 2017-08-19 DIAGNOSIS — E119 Type 2 diabetes mellitus without complications: Secondary | ICD-10-CM | POA: Diagnosis not present

## 2017-08-19 DIAGNOSIS — M1990 Unspecified osteoarthritis, unspecified site: Secondary | ICD-10-CM | POA: Diagnosis not present

## 2017-08-19 DIAGNOSIS — J9610 Chronic respiratory failure, unspecified whether with hypoxia or hypercapnia: Secondary | ICD-10-CM | POA: Diagnosis not present

## 2017-08-19 DIAGNOSIS — Z87891 Personal history of nicotine dependence: Secondary | ICD-10-CM | POA: Diagnosis not present

## 2017-08-19 DIAGNOSIS — C221 Intrahepatic bile duct carcinoma: Secondary | ICD-10-CM | POA: Diagnosis not present

## 2017-08-19 DIAGNOSIS — I10 Essential (primary) hypertension: Secondary | ICD-10-CM | POA: Diagnosis not present

## 2017-08-19 DIAGNOSIS — M5136 Other intervertebral disc degeneration, lumbar region: Secondary | ICD-10-CM | POA: Diagnosis not present

## 2017-08-19 DIAGNOSIS — J189 Pneumonia, unspecified organism: Secondary | ICD-10-CM | POA: Diagnosis not present

## 2017-08-19 DIAGNOSIS — M329 Systemic lupus erythematosus, unspecified: Secondary | ICD-10-CM | POA: Diagnosis not present

## 2017-08-19 DIAGNOSIS — D860 Sarcoidosis of lung: Secondary | ICD-10-CM | POA: Diagnosis not present

## 2017-08-19 DIAGNOSIS — J441 Chronic obstructive pulmonary disease with (acute) exacerbation: Secondary | ICD-10-CM | POA: Diagnosis not present

## 2017-08-19 DIAGNOSIS — Z434 Encounter for attention to other artificial openings of digestive tract: Secondary | ICD-10-CM | POA: Diagnosis not present

## 2017-08-19 DIAGNOSIS — Z9181 History of falling: Secondary | ICD-10-CM | POA: Diagnosis not present

## 2017-08-19 LAB — CULTURE, BLOOD (SINGLE)
Culture: NO GROWTH
Special Requests: ADEQUATE

## 2017-08-19 NOTE — Patient Outreach (Signed)
[facility]) Care Management  08/19/2017  TISH BEGIN [birth year] [id number]   Referral received for post acute care coordination, however patient discharged home instead of SNF. It was confirmed that [place] will follow for transition of care.    Sheralyn Boatman [facility] Care Management [phone]

## 2017-08-19 NOTE — Patient Outreach (Signed)
[facility]) Care Management  08/19/2017  MIDGE MOMON 04-Jun-1940 [id number]  Care coordination call: follow up on referral received 08/18/17 from [place] hospital liaison for [facility] RN CM for COPD- pt's  Recent hospitalization April 26-May 2,2019 for severe sepsis, COPD,  Pneumonia both lower lobes.  PCP office listed as doing transition of  Care call).    Outreach call made to pt, son Elta [place] answered the phone, reports pt is currently sleeping, HIPAA identifiers provided.  RN CM requested a return  Call from pt, contact information provided.   Plan:  RN CM to wait return call from pt.   Zara Chess.   [place] Care Management  [phone]

## 2017-08-22 ENCOUNTER — Telehealth: Payer: Self-pay | Admitting: *Deleted

## 2017-08-22 ENCOUNTER — Other Ambulatory Visit: Payer: Self-pay | Admitting: *Deleted

## 2017-08-22 NOTE — Patient Outreach (Signed)
[facility]) Care Management  08/22/2017  Regina Hood August 01, 1940 [id number]  Unsuccessful telephone encounter to Rae Mar, 77 year old female follow up on referral received 08/18/17 From [place] hospital liaison for [facility] RN CM for COPD disease management.  Pt had a  Recent hospitalization April 26-May 2,2019 for severe sepsis, COPD,pneumonia of both lungs.  PCP  Office does Transition of care calls.   Also received an in basket today from [place] [facility] [place] discharge call done on 08/20/17, had  3 RED flags: caregiver reached, know who to call for changes answered no, scheduled follow up  Answered no, unfilled prescriptions answered no.   HIPAA compliant voice message left with RN CM's contact information.    Plan: if no response to voice message left, unsuccessful telephone outreach letter to be sent to pt and If no response in 10 business days to close case.  RN CM to have coworker Richarda Osmond RN CM covering  For her make another outreach call in 3-4 business days.      Zara Chess.   [place] [facility] Management  [phone]

## 2017-08-22 NOTE — Telephone Encounter (Signed)
Order for services called per Lorretta Harp, NP for Dr Tasia Catchings and demographic and last office note faxed

## 2017-08-22 NOTE — Telephone Encounter (Signed)
Hospice called asking for referral orders to be sent on this patient. Son and patient have called requesting services. Please fax order asap

## 2017-08-23 ENCOUNTER — Ambulatory Visit: Payer: Self-pay | Admitting: *Deleted

## 2017-08-23 ENCOUNTER — Telehealth: Payer: Self-pay | Admitting: Oncology

## 2017-08-23 NOTE — Telephone Encounter (Signed)
I have signed the hospice order.

## 2017-08-23 NOTE — Telephone Encounter (Signed)
Received request of hospice.  Call patient's home and talked to her on the phone. She continues to feel weak and wants to go on hospice.  Will honor patient's wish. Hospice order signed.

## 2017-08-24 DIAGNOSIS — J449 Chronic obstructive pulmonary disease, unspecified: Secondary | ICD-10-CM | POA: Diagnosis not present

## 2017-08-26 ENCOUNTER — Encounter: Payer: Self-pay | Admitting: *Deleted

## 2017-08-26 ENCOUNTER — Other Ambulatory Visit: Payer: Self-pay | Admitting: *Deleted

## 2017-08-26 NOTE — Patient Outreach (Signed)
[facility]) Care Management [place] Telephone Outreach  08/26/2017  [name] 06-22-1940 [id number]  10:25 am: Unsuccessful outgoing telephone outreach to Regina Hood, 77 y/o female referred to [place] after recent hospitalization April 26- Aug 18, 2017 for severe sepsis, bilateral pneumonia, COPD exacerbation.  Patient was discharged home to self-care.  Patient has history including, but not limited to, COPD, lupus, DM, HTN, DDD.  HIPAA compliant voice mail message left for patient, requesting return call back.  Plan:  Will re-attempt [name] CM telephone again next week if no response back from patient, and will update patient's primary [facility] RN CCM of today's unsuccessful telephone outreach attempt   Oneta Rack, RN, BSN, [place] Care Management  [phone]

## 2017-08-29 DIAGNOSIS — D8683 Sarcoid iridocyclitis: Secondary | ICD-10-CM | POA: Diagnosis not present

## 2017-08-29 DIAGNOSIS — H4043X0 Glaucoma secondary to eye inflammation, bilateral, stage unspecified: Secondary | ICD-10-CM | POA: Diagnosis not present

## 2017-08-31 ENCOUNTER — Encounter: Payer: Self-pay | Admitting: *Deleted

## 2017-08-31 ENCOUNTER — Telehealth: Payer: Self-pay | Admitting: *Deleted

## 2017-08-31 ENCOUNTER — Other Ambulatory Visit: Payer: Self-pay | Admitting: Nurse Practitioner

## 2017-08-31 MED ORDER — CIPROFLOXACIN HCL 500 MG PO TABS: 500.0000 mg | ORAL_TABLET | Freq: Two times a day (BID) | ORAL | 0 refills | Status: AC

## 2017-08-31 NOTE — Telephone Encounter (Signed)
Hospice called stating patient is complaining of urinary frequency and burning, and now only able to void in small amounts. Nurse is unable to collect urine sample until tomorrow, and is asking if we would go ahead and start patient on medicine today. Please advise

## 2017-08-31 NOTE — Telephone Encounter (Signed)
Per Alease Medina, NP Cipro 500 mg twice a day times 7 days ordered and no need to collect UA. Mitzi informed

## 2017-09-02 ENCOUNTER — Other Ambulatory Visit: Payer: Self-pay | Admitting: *Deleted

## 2017-09-02 DIAGNOSIS — D8683 Sarcoid iridocyclitis: Secondary | ICD-10-CM | POA: Diagnosis not present

## 2017-09-02 NOTE — Patient Outreach (Signed)
[facility]) Care Management  09/02/2017  Regina Hood [birth year] [id number]  RN CM to close case due to unable to contact- 3 phone call attempts, unsuccessful outreach  Letter sent- no responses.   Pt referred to [facility] services after recent  Hospitalization April 26-May 2,2019 for severe sepsis, bilateral pneumonia, COPD Exacerbation.  Also view in EMR pt active with Hospice services.    Plan:  To close case due to unable to contact.          Plan to inform PCP of case closure.   Zara Chess.   [facility] Management  [phone]

## 2017-09-02 NOTE — Telephone Encounter (Signed)
This encounter was created in error - please disregard.

## 2017-09-14 NOTE — ED Provider Notes (Signed)
EKG from August 12, 2017 reviewed by me Heart rate 120 QRS 90 QTc 460 Sinus tachycardia.  No evidence of ischemia.   Delman Kitten, MD 09/14/17 1517

## 2017-09-21 ENCOUNTER — Other Ambulatory Visit: Payer: Self-pay | Admitting: *Deleted

## 2017-09-21 MED ORDER — OXYCODONE HCL 5 MG PO TABS: 5.0000 mg | ORAL_TABLET | Freq: Four times a day (QID) | ORAL | 0 refills | Status: DC | PRN

## 2017-09-21 NOTE — Telephone Encounter (Signed)
This patient is not on Oxycodone, we have her on Norco, Sena Slate will check with patient and get back with me

## 2017-09-21 NOTE — Telephone Encounter (Signed)
Patient has been taking old prescription from the fall of Oxycodone and not taking the Norco. She is asking for a refill of Oxycodone 5 mg 1 every 6 hours as needed. She is having pain in her abdomen sometimes rated at 10/10. Please advise

## 2017-10-03 DIAGNOSIS — D8683 Sarcoid iridocyclitis: Secondary | ICD-10-CM | POA: Diagnosis not present

## 2017-10-14 ENCOUNTER — Other Ambulatory Visit: Payer: Self-pay

## 2017-10-14 ENCOUNTER — Other Ambulatory Visit: Payer: Self-pay | Admitting: *Deleted

## 2017-10-14 ENCOUNTER — Emergency Department
Admission: EM | Admit: 2017-10-14 | Discharge: 2017-10-15 | Disposition: A | Discharge status: Home / Self Care | Attending: Emergency Medicine | Admitting: Emergency Medicine

## 2017-10-14 DIAGNOSIS — E119 Type 2 diabetes mellitus without complications: Secondary | ICD-10-CM | POA: Insufficient documentation

## 2017-10-14 DIAGNOSIS — Z8509 Personal history of malignant neoplasm of other digestive organs: Secondary | ICD-10-CM | POA: Insufficient documentation

## 2017-10-14 DIAGNOSIS — Z79899 Other long term (current) drug therapy: Secondary | ICD-10-CM | POA: Diagnosis not present

## 2017-10-14 DIAGNOSIS — Z0000 Encounter for general adult medical examination without abnormal findings: Secondary | ICD-10-CM | POA: Diagnosis not present

## 2017-10-14 DIAGNOSIS — Z87891 Personal history of nicotine dependence: Secondary | ICD-10-CM | POA: Diagnosis not present

## 2017-10-14 DIAGNOSIS — Y828 Other medical devices associated with adverse incidents: Secondary | ICD-10-CM | POA: Diagnosis not present

## 2017-10-14 DIAGNOSIS — Z139 Encounter for screening, unspecified: Secondary | ICD-10-CM | POA: Diagnosis not present

## 2017-10-14 DIAGNOSIS — J449 Chronic obstructive pulmonary disease, unspecified: Secondary | ICD-10-CM | POA: Insufficient documentation

## 2017-10-14 DIAGNOSIS — T85520A Displacement of bile duct prosthesis, initial encounter: Secondary | ICD-10-CM | POA: Insufficient documentation

## 2017-10-14 DIAGNOSIS — I10 Essential (primary) hypertension: Secondary | ICD-10-CM | POA: Insufficient documentation

## 2017-10-14 DIAGNOSIS — K838 Other specified diseases of biliary tract: Secondary | ICD-10-CM | POA: Diagnosis not present

## 2017-10-14 MED ORDER — OXYCODONE HCL 5 MG PO TABS: 5.0000 mg | ORAL_TABLET | Freq: Four times a day (QID) | ORAL | 0 refills | Status: DC | PRN

## 2017-10-14 NOTE — Telephone Encounter (Signed)
Narcotic refill request:  [place] CSRS database reviewed prior to consideration of refills:  NARX scores --    Narcotic: 380  Sedative: 351  30 day average MME/day: 15.00  30 day average LME/day:  0.58  ORS score (range 0-999): 410  Providers outside of this practice prescribing narcotics/sedatives in the last 30 days: No  Primary oncologist is out of town. I have been asked to review chart for consideration of narcotic refill. Patient is under hospice services for terminal cholangiocarcinoma. Given a current oncological diagnosis, this patient has the potential to experience significant cancer related pain. Hospice nurse seeing patient on a regular basis for in home management and assessments. Benefits versus risks associated with continued therapy considered. Will continue pain management with opioids as previously prescribed. Patient has been previously educated that medications should not be bitten, chewed, or crushed. Hospice nurse to reinforce that medications should not be sold or shared, taken with alcohol, or used while driving. Patient  been made aware of the side effects of using this medication. Patient understands that this medication can cause CNS depression, increase her risk of falls, and even lead to overdose that may hasten death, if used outside of the prescribed parameters. With all of this in mind, risks  and responsibilities associated with therapy acknowledged, and patient elects to continue to use the prescribed interventions.   Refill prescription sent in for: 1. Roxicodone 5 mg q6h PRN (Disp #30)  Honor Loh, MSN, APRN, FNP-C, CEN Oncology/Hematology Nurse Practitioner  [facility] 10/14/17, 2:16 PM

## 2017-10-14 NOTE — ED Triage Notes (Signed)
Pt arrives to ED via POV from home with c/o complications from her biliary tube. Pt reports the tube is pulling loose and not draining properly and was sent here to have it stitched back in place.

## 2017-10-15 ENCOUNTER — Emergency Department

## 2017-10-15 DIAGNOSIS — K838 Other specified diseases of biliary tract: Secondary | ICD-10-CM | POA: Diagnosis not present

## 2017-10-15 MED ORDER — LIDOCAINE HCL (PF) 1 % IJ SOLN: 5.0000 mL | Freq: Once | INTRAMUSCULAR | Status: DC

## 2017-10-15 MED ORDER — LIDOCAINE HCL (PF) 1 % IJ SOLN
INTRAMUSCULAR | Status: AC
  Filled 2017-10-15: qty 5

## 2017-10-15 NOTE — ED Provider Notes (Signed)
[facility] Emergency Department Provider Note    First MD Initiated Contact with Patient 10/14/17 2356     (approximate)  I have reviewed the triage vital signs and the nursing notes.   HISTORY  Chief Complaint Biliary-tube complications    HPI Regina Hood is a 77 y.o. female with history of cholangiocarcinoma with indwelling biliary drains presents to the emergency department with concerned that the biliary tube may be getting dislodge and as such was referred to the emergency department by the hospice nurse for a "stitch placement".  Patient denies any abdominal discomfort.   Past Medical History:  Diagnosis Date  . Arthritis   . Cancer ([place])   . Cellulitis   . Collagen vascular disease ([place])   . COPD (chronic obstructive pulmonary disease) ([place])   . DDD (degenerative disc disease), lumbar   . Depression   . Diabetes mellitus   . Diverticulitis   . GERD (gastroesophageal reflux disease)   . GI bleed   . Headache(784.0)   . Hypertension   . Lupus ([place])   . Ulcerative colitis [facility])     Patient Active Problem List   Diagnosis Date Noted  . Cutaneous lupus erythematosus   . Malnutrition of moderate degree 06/09/2017  . Generalized abdominal pain   . Hyperbilirubinemia   . Hypomagnesemia   . Hypokalemia 04/27/2017  . Proteinuria 03/31/2017  . COPD exacerbation ([place]) 03/16/2017  . Dehydration 01/19/2017  . History of iron deficiency anemia 12/29/2016  . Systemic lupus erythematosus ([place]) 12/29/2016  . Cholangiocarcinoma ([place]) 12/29/2016  . History of ulcerative colitis 12/29/2016  . Goals of care, counseling/discussion 12/29/2016  . Severe sepsis ([place]) 11/13/2016  . At risk for sepsis 09/29/2016  . Red blood cell antibody positive, compatible PRBC difficult to obtain 09/22/2016  . Type 2 diabetes mellitus without complication, without long-term current use of insulin ([place]) 09/17/2016  . Liver mass, left lobe 08/23/2016  . Elevated  liver enzymes 08/19/2016  . Pruritic rash 08/19/2016  . IDA (iron deficiency anemia) 07/18/2016  . Cellulitis of right upper extremity 07/05/2016  . History of rectal bleeding 07/05/2016  . Rectal bleeding 06/29/2016  . Hypertension 01/07/2016  . Anxiety and depression 09/26/2013  . DDD (degenerative disc disease) 09/26/2013  . Diabetes ([place]) 09/26/2013  . Sarcoidosis 09/26/2013  . UC (ulcerative colitis) ([place]) 09/26/2013  . Proximal humerus fracture, left, closed, initial encounter 09/17/2011    Past Surgical History:  Procedure Laterality Date  . ABDOMINAL HYSTERECTOMY    . BREAST BIOPSY Right   . COLONOSCOPY    . COLONOSCOPY WITH PROPOFOL N/A 08/18/2016   Procedure: COLONOSCOPY WITH PROPOFOL;  Surgeon: Manya Silvas, MD;  Location: [facility] ENDOSCOPY;  Service: Endoscopy;  Laterality: N/A;  . ESOPHAGOGASTRODUODENOSCOPY (EGD) WITH PROPOFOL N/A 08/18/2016   Procedure: ESOPHAGOGASTRODUODENOSCOPY (EGD) WITH PROPOFOL;  Surgeon: Manya Silvas, MD;  Location[facility] ENDOSCOPY;  Service: Endoscopy;  Laterality: N/A;  . IR BILIARY DRAIN PLACEMENT WITH CHOLANGIOGRAM  06/08/2017  . IR EXCHANGE BILIARY DRAIN  08/17/2017  . ORIF HUMERUS FRACTURE  09/17/2011   Procedure: OPEN REDUCTION INTERNAL FIXATION (ORIF) PROXIMAL HUMERUS FRACTURE;  Surgeon: Augustin Schooling, MD;  Location: [place];  Service: Orthopedics;  Laterality: Left;    Prior to Admission medications   Medication Sig Start Date End Date Taking? Authorizing Provider  acetaminophen (TYLENOL) 500 MG tablet Take 1,000 mg by mouth 3 (three) times daily.    [provider]  azaTHIOprine (IMURAN) 50 MG tablet Take 50  mg by mouth daily.    [provider]  Cyanocobalamin (VITAMIN B-12 PO) Take 1,000 mcg by mouth daily.    [provider]  diphenoxylate-atropine (LOMOTIL) 2.5-0.025 MG tablet Take 2 tablets by mouth 4 (four) times daily as needed for diarrhea or loose stools. 07/07/17   Jacquelin Hawking, NP  feeding  supplement, ENSURE ENLIVE, (ENSURE ENLIVE) LIQD Take 237 mLs by mouth 2 (two) times daily between meals. 08/18/17   Vaughan Basta, MD  Fluticasone-Salmeterol (ADVAIR DISKUS) 100-50 MCG/DOSE AEPB Inhale 1 puff into the lungs 2 (two) times daily. 07/18/17   Flora Lipps, MD  hydroxychloroquine (PLAQUENIL) 200 MG tablet Take 200 mg by mouth 2 (two) times daily.     [provider]  lidocaine-prilocaine (EMLA) cream Apply 1 application topically as needed. 02/16/17   Cammie Sickle, MD  magnesium chloride (SLOW-MAG) 64 MG TBEC SR tablet Take 1 tablet (64 mg total) by mouth daily. 07/13/17   Jacquelin Hawking, NP  ondansetron (ZOFRAN) 8 MG tablet TAKE ONE TABLET EVERY 12 HOURS AS NEEDED Patient taking differently: Take 1 tablet [facility]) by mouth every 12 hours as needed for nausea and/or vomiting 04/06/17   Earlie Server, MD  oxyCODONE (OXY IR/ROXICODONE) 5 MG immediate release tablet Take 1 tablet (5 mg total) by mouth every 6 (six) hours as needed for severe pain. 10/14/17   Karen Kitchens, NP  pantoprazole (PROTONIX) 40 MG tablet Take 1 tablet (40 mg total) by mouth daily. 05/23/17   Earlie Server, MD  predniSONE (DELTASONE) 5 MG tablet Take 1 tablet (5 mg total) by mouth daily with breakfast. 08/19/17   Vaughan Basta, MD  Sodium Chloride Flush (NORMAL SALINE FLUSH) 0.9 % SOLN 1 mL by Other route every 8 (eight) hours. Flush each biliary drain with 1 mL twice daily 06/24/17   Earlie Server, MD  triamcinolone cream (KENALOG) 0.1 % Apply 1 application topically daily.  07/20/17   [provider]  venlafaxine XR (EFFEXOR-XR) 75 MG 24 hr capsule Take 75 mg by mouth daily.     [provider]    Allergies Penicillins  Family History  Problem Relation Age of Onset  . Breast cancer Mother 21  . COPD Father   . Prostate cancer Brother     Social History Social History   Tobacco Use  . Smoking status: Former Smoker    Packs/day: 1.00    Years: 10.00    Pack years: 10.00     Types: Cigarettes    Last attempt to quit: 12/30/1998    Years since quitting: 18.8  . Smokeless tobacco: Never Used  . Tobacco comment: quit smoking in 2003  Substance Use Topics  . Alcohol use: No  . Drug use: No    Review of Systems Constitutional: No fever/chills Eyes: No visual changes. ENT: No sore throat. Cardiovascular: Denies chest pain. Respiratory: Denies shortness of breath. Gastrointestinal: No abdominal pain.  No nausea, no vomiting.  No diarrhea.  No constipation. Genitourinary: Negative for dysuria. Musculoskeletal: Negative for neck pain.  Negative for back pain. Integumentary: Negative for rash. Neurological: Negative for headaches, focal weakness or numbness.   ____________________________________________   PHYSICAL EXAM:  VITAL SIGNS: ED Triage Vitals  Enc Vitals Group     BP 10/14/17 2350 130/86     Pulse Rate 10/14/17 2350 (!) 109     Resp 10/14/17 2350 17     Temp 10/14/17 2350 97.9 F (36.6 C)     Temp Source 10/14/17 2350  Oral     SpO2 10/14/17 2350 96 %     Weight 10/14/17 2347 63.5 kg (140 lb)     Height 10/14/17 2347 1.575 m (5' 2" )     Head Circumference --      Peak Flow --      Pain Score 10/14/17 2347 0     Pain Loc --      Pain Edu? --      Excl. in [place]? --     Constitutional: Alert and oriented. Well appearing and in no acute distress. Eyes: Conjunctivae are normal.  Head: Atraumatic. Mouth/Throat: Mucous membranes are moist. Cardiovascular: Normal rate, regular rhythm. Good peripheral circulation. Grossly normal heart sounds. Respiratory: Normal respiratory effort.  No retractions. Lungs CTAB. Gastrointestinal: Soft and nontender. No distention.  Midline biliary drain held in place with suture Musculoskeletal: No lower extremity tenderness nor edema. No gross deformities of extremities. Neurologic:  Normal speech and language. No gross focal neurologic deficits are appreciated.  Skin:  Skin is warm, dry and intact. No rash  noted. Psychiatric: Mood and affect are normal. Speech and behavior are normal.    RADIOLOGY I, [place], personally viewed and evaluated these images (plain radiographs) as part of my medical decision making, as well as reviewing the written report by the radiologist.  ED MD interpretation: Overall configuration of the internal and external biliary drain system is stable in comparison to previous x-ray.  Official radiology report(s): Dg Abdomen 1 View  Result Date: 10/15/2017 CLINICAL DATA:  Complications from biliary tube. Patient reports tube is pulling loose and not draining properly. EXAM: ABDOMEN - 1 VIEW COMPARISON:  08/17/2017 FINDINGS: The overall configuration of the patient's pre-existing left internal/external biliary drainage catheter is similar to the study from 08/17/2017. There is chronic scarring and/or atelectasis at each lung base left greater than right. The bowel gas pattern is unremarkable. No acute osseous abnormality is seen. No radio-opaque calculi or other significant radiographic abnormality. IMPRESSION: Overall configuration of the internal/external biliary drainage system is stable. No acute abnormality identified. Electronically Signed   By: Ashley Royalty M.D.   On: 10/15/2017 00:37     Procedures   ____________________________________________   INITIAL IMPRESSION / ASSESSMENT AND PLAN / ED COURSE  As part of my medical decision making, I reviewed the following data within the electronic MEDICAL RECORD NUMBER   77 year old female presented to the emergency department with above-stated history and physical exam secondary concern for dislodged biliary drain.  External exam revealed no evidence of infection at the site of the biliary drain no active drainage around the tube noted or on the bandage.  Drain is currently held in place with a suture.  Spoke with the patient at length regarding the fact that I could put an additional suture to anchor the drain so it  would not slip inferiorly as it has however I assured her that the stitch was still in place and as such the drain would not fall out.  Patient stated that she did not want an additional suture placed at this time. _____________________________  FINAL CLINICAL IMPRESSION(S) / ED DIAGNOSES  Final diagnoses:  Encounter for medical screening examination     MEDICATIONS GIVEN DURING THIS VISIT:  Medications - No data to display   ED Discharge Orders    None       Note:  This document was prepared using Dragon voice recognition software and may include unintentional dictation errors.    Gregor Hams, MD  10/15/17 0635  

## 2017-10-15 NOTE — ED Notes (Signed)
Pt wound redressed by this RN. MD Owens Shark at bedside to discuss pt's wishes. Pt states that she would like to leave her biliary tube alone since it was still in proper placement.

## 2017-11-01 ENCOUNTER — Inpatient Hospital Stay
Admission: EM | Admit: 2017-11-01 | Discharge: 2017-11-03 | DRG: 919 | Disposition: A | Discharge status: Home / Self Care | Attending: Internal Medicine | Admitting: Internal Medicine

## 2017-11-01 ENCOUNTER — Emergency Department

## 2017-11-01 ENCOUNTER — Other Ambulatory Visit: Payer: Self-pay | Admitting: *Deleted

## 2017-11-01 ENCOUNTER — Other Ambulatory Visit: Payer: Self-pay

## 2017-11-01 ENCOUNTER — Telehealth: Payer: Self-pay | Admitting: *Deleted

## 2017-11-01 DIAGNOSIS — Z66 Do not resuscitate: Secondary | ICD-10-CM | POA: Diagnosis present

## 2017-11-01 DIAGNOSIS — C221 Intrahepatic bile duct carcinoma: Secondary | ICD-10-CM | POA: Diagnosis not present

## 2017-11-01 DIAGNOSIS — F329 Major depressive disorder, single episode, unspecified: Secondary | ICD-10-CM | POA: Diagnosis present

## 2017-11-01 DIAGNOSIS — Z90710 Acquired absence of both cervix and uterus: Secondary | ICD-10-CM

## 2017-11-01 DIAGNOSIS — Z6823 Body mass index (BMI) 23.0-23.9, adult: Secondary | ICD-10-CM

## 2017-11-01 DIAGNOSIS — T85520A Displacement of bile duct prosthesis, initial encounter: Secondary | ICD-10-CM | POA: Diagnosis not present

## 2017-11-01 DIAGNOSIS — Z7951 Long term (current) use of inhaled steroids: Secondary | ICD-10-CM

## 2017-11-01 DIAGNOSIS — E119 Type 2 diabetes mellitus without complications: Secondary | ICD-10-CM | POA: Diagnosis present

## 2017-11-01 DIAGNOSIS — J449 Chronic obstructive pulmonary disease, unspecified: Secondary | ICD-10-CM | POA: Diagnosis present

## 2017-11-01 DIAGNOSIS — Z880 Allergy status to penicillin: Secondary | ICD-10-CM

## 2017-11-01 DIAGNOSIS — C801 Malignant (primary) neoplasm, unspecified: Secondary | ICD-10-CM

## 2017-11-01 DIAGNOSIS — E876 Hypokalemia: Secondary | ICD-10-CM | POA: Diagnosis not present

## 2017-11-01 DIAGNOSIS — F419 Anxiety disorder, unspecified: Secondary | ICD-10-CM | POA: Diagnosis present

## 2017-11-01 DIAGNOSIS — Z8042 Family history of malignant neoplasm of prostate: Secondary | ICD-10-CM

## 2017-11-01 DIAGNOSIS — Z825 Family history of asthma and other chronic lower respiratory diseases: Secondary | ICD-10-CM

## 2017-11-01 DIAGNOSIS — Z803 Family history of malignant neoplasm of breast: Secondary | ICD-10-CM

## 2017-11-01 DIAGNOSIS — K219 Gastro-esophageal reflux disease without esophagitis: Secondary | ICD-10-CM | POA: Diagnosis present

## 2017-11-01 DIAGNOSIS — I10 Essential (primary) hypertension: Secondary | ICD-10-CM | POA: Diagnosis not present

## 2017-11-01 DIAGNOSIS — Z7952 Long term (current) use of systemic steroids: Secondary | ICD-10-CM

## 2017-11-01 DIAGNOSIS — E440 Moderate protein-calorie malnutrition: Secondary | ICD-10-CM | POA: Diagnosis present

## 2017-11-01 DIAGNOSIS — Z79899 Other long term (current) drug therapy: Secondary | ICD-10-CM

## 2017-11-01 DIAGNOSIS — K5190 Ulcerative colitis, unspecified, without complications: Secondary | ICD-10-CM | POA: Diagnosis present

## 2017-11-01 DIAGNOSIS — L931 Subacute cutaneous lupus erythematosus: Secondary | ICD-10-CM | POA: Diagnosis present

## 2017-11-01 DIAGNOSIS — M5136 Other intervertebral disc degeneration, lumbar region: Secondary | ICD-10-CM | POA: Diagnosis present

## 2017-11-01 DIAGNOSIS — Z87891 Personal history of nicotine dependence: Secondary | ICD-10-CM

## 2017-11-01 DIAGNOSIS — D869 Sarcoidosis, unspecified: Secondary | ICD-10-CM | POA: Diagnosis present

## 2017-11-01 DIAGNOSIS — Y732 Prosthetic and other implants, materials and accessory gastroenterology and urology devices associated with adverse incidents: Secondary | ICD-10-CM | POA: Diagnosis present

## 2017-11-01 DIAGNOSIS — K831 Obstruction of bile duct: Secondary | ICD-10-CM | POA: Diagnosis present

## 2017-11-01 LAB — CBC WITH DIFFERENTIAL/PLATELET
Basophils Absolute: 0 10*3/uL (ref 0–0.1)
Basophils Relative: 0 %
EOS PCT: 0 %
Eosinophils Absolute: 0 10*3/uL (ref 0–0.7)
HEMATOCRIT: 29.3 % — AB (ref 35.0–47.0)
Hemoglobin: 10.2 g/dL — ABNORMAL LOW (ref 12.0–16.0)
LYMPHS ABS: 0.8 10*3/uL — AB (ref 1.0–3.6)
LYMPHS PCT: 8 %
MCH: 31.3 pg (ref 26.0–34.0)
MCHC: 34.7 g/dL (ref 32.0–36.0)
MCV: 90.2 fL (ref 80.0–100.0)
MONO ABS: 1.2 10*3/uL — AB (ref 0.2–0.9)
Monocytes Relative: 11 %
NEUTROS ABS: 9.2 10*3/uL — AB (ref 1.4–6.5)
Neutrophils Relative %: 81 %
PLATELETS: 301 10*3/uL (ref 150–440)
RBC: 3.25 MIL/uL — AB (ref 3.80–5.20)
RDW: 17.4 % — ABNORMAL HIGH (ref 11.5–14.5)
WBC: 11.3 10*3/uL — ABNORMAL HIGH (ref 3.6–11.0)

## 2017-11-01 LAB — COMPREHENSIVE METABOLIC PANEL
ALBUMIN: 2.7 g/dL — AB (ref 3.5–5.0)
ALT: 72 U/L — ABNORMAL HIGH (ref 0–44)
AST: 184 U/L — AB (ref 15–41)
Alkaline Phosphatase: 393 U/L — ABNORMAL HIGH (ref 38–126)
Anion gap: 12 (ref 5–15)
BUN: 10 mg/dL (ref 8–23)
CHLORIDE: 90 mmol/L — AB (ref 98–111)
CO2: 31 mmol/L (ref 22–32)
Calcium: 7.7 mg/dL — ABNORMAL LOW (ref 8.9–10.3)
Creatinine, Ser: 0.98 mg/dL (ref 0.44–1.00)
GFR calc Af Amer: >60 mL/min (ref >60)
GFR calc non Af Amer: 55 mL/min — ABNORMAL LOW (ref >60)
GLUCOSE: 90 mg/dL (ref 70–99)
POTASSIUM: 2.4 mmol/L — AB (ref 3.5–5.1)
SODIUM: 133 mmol/L — AB (ref 135–145)
Total Bilirubin: 3.7 mg/dL — ABNORMAL HIGH (ref 0.3–1.2)
Total Protein: 6.5 g/dL (ref 6.5–8.1)

## 2017-11-01 LAB — APTT: aPTT: 40 seconds — ABNORMAL HIGH (ref 24–36)

## 2017-11-01 LAB — PROTIME-INR
INR: 1.3
Prothrombin Time: 16.1 seconds — ABNORMAL HIGH (ref 11.4–15.2)

## 2017-11-01 MED ORDER — ONDANSETRON HCL 4 MG PO TABS
4.0000 mg | ORAL_TABLET | Freq: Four times a day (QID) | ORAL | Status: DC | PRN
  Administered 2017-11-02: 4 mg via ORAL
  Filled 2017-11-01: qty 1

## 2017-11-01 MED ORDER — AZATHIOPRINE 50 MG PO TABS
50.0000 mg | ORAL_TABLET | Freq: Every day | ORAL | Status: DC
  Administered 2017-11-02 – 2017-11-03 (×2): 50 mg via ORAL
  Filled 2017-11-01 (×2): qty 1

## 2017-11-01 MED ORDER — SODIUM CHLORIDE 0.9 % IV BOLUS
500.0000 mL | Freq: Once | INTRAVENOUS | Status: AC
  Administered 2017-11-01: 500 mL via INTRAVENOUS

## 2017-11-01 MED ORDER — SODIUM CHLORIDE 0.9 % IV SOLN
INTRAVENOUS | Status: DC
  Administered 2017-11-01: 23:00:00 via INTRAVENOUS

## 2017-11-01 MED ORDER — PREDNISONE 10 MG PO TABS
5.0000 mg | ORAL_TABLET | Freq: Every day | ORAL | Status: DC
  Administered 2017-11-02 – 2017-11-03 (×2): 5 mg via ORAL
  Filled 2017-11-01 (×2): qty 1

## 2017-11-01 MED ORDER — IOHEXOL 300 MG/ML  SOLN
100.0000 mL | Freq: Once | INTRAMUSCULAR | Status: AC | PRN
  Administered 2017-11-01: 100 mL via INTRAVENOUS

## 2017-11-01 MED ORDER — POTASSIUM CHLORIDE 10 MEQ/100ML IV SOLN
10.0000 meq | INTRAVENOUS | Status: AC
  Administered 2017-11-01 – 2017-11-02 (×6): 10 meq via INTRAVENOUS
  Filled 2017-11-01 (×8): qty 100

## 2017-11-01 MED ORDER — ENOXAPARIN SODIUM 40 MG/0.4ML ~~LOC~~ SOLN
40.0000 mg | SUBCUTANEOUS | Status: DC
  Administered 2017-11-02: 21:00:00 40 mg via SUBCUTANEOUS
  Filled 2017-11-01: qty 0.4

## 2017-11-01 MED ORDER — ACETAMINOPHEN 650 MG RE SUPP: 650.0000 mg | Freq: Four times a day (QID) | RECTAL | Status: DC | PRN

## 2017-11-01 MED ORDER — OXYCODONE HCL 5 MG PO TABS
5.0000 mg | ORAL_TABLET | ORAL | Status: DC | PRN
  Administered 2017-11-01 – 2017-11-03 (×3): 5 mg via ORAL
  Filled 2017-11-01 (×3): qty 1

## 2017-11-01 MED ORDER — HYDROXYCHLOROQUINE SULFATE 200 MG PO TABS
200.0000 mg | ORAL_TABLET | Freq: Two times a day (BID) | ORAL | Status: DC
  Administered 2017-11-01 – 2017-11-03 (×4): 200 mg via ORAL
  Filled 2017-11-01 (×5): qty 1

## 2017-11-01 MED ORDER — ONDANSETRON HCL 4 MG/2ML IJ SOLN
4.0000 mg | Freq: Four times a day (QID) | INTRAMUSCULAR | Status: DC | PRN
Start: 2017-11-01 — End: 2017-11-03

## 2017-11-01 MED ORDER — ACETAMINOPHEN 325 MG PO TABS: 650.0000 mg | ORAL_TABLET | Freq: Four times a day (QID) | ORAL | Status: DC | PRN

## 2017-11-01 MED ORDER — MOMETASONE FURO-FORMOTEROL FUM 100-5 MCG/ACT IN AERO
2.0000 | INHALATION_SPRAY | Freq: Two times a day (BID) | RESPIRATORY_TRACT | Status: DC
  Filled 2017-11-01: qty 8.8

## 2017-11-01 NOTE — ED Notes (Signed)
Pt has bilary tubes and states they need to be changed.  Pt states the hospice nurse told her yesterday to have the tubes changed.  Pt also has a foley cath x 3 months.  Pt also reports nausea and abd pain.  md at bedside.  No chest pain or sob. Pt alert.

## 2017-11-01 NOTE — H&P (Addendum)
[facility] [facility] NAME: Regina Hood    MR#:  [id number]  DATE OF BIRTH:  [birth year]  DATE OF ADMISSION:  11/01/2017  PRIMARY CARE PHYSICIAN: Idelle Crouch, MD   REQUESTING/REFERRING PHYSICIAN: Joni Fears, MD  CHIEF COMPLAINT:   Chief Complaint  Patient presents with  . Biliary Tube not Draining    HISTORY OF PRESENT ILLNESS:  Regina Hood  is a 77 y.o. female who presents with biliary tube malfunction, pt has tube in place due to cholangiocarcinoma, it started malfunctioning sometime over the past 24 hours.  She has had increased discomfort at the site due to the same.  Tube needs exchanging, hospitalist called for admission with plan for IR exchange  PAST MEDICAL HISTORY:   Past Medical History:  Diagnosis Date  . Arthritis   . Cancer ([place])   . Cellulitis   . Collagen vascular disease ([place])   . COPD (chronic obstructive pulmonary disease) ([place])   . DDD (degenerative disc disease), lumbar   . Depression   . Diabetes mellitus   . Diverticulitis   . GERD (gastroesophageal reflux disease)   . GI bleed   . Headache(784.0)   . Hypertension   . Lupus ([place])   . Ulcerative colitis ([place])      PAST SURGICAL HISTORY:   Past Surgical History:  Procedure Laterality Date  . ABDOMINAL HYSTERECTOMY    . BREAST BIOPSY Right   . COLONOSCOPY    . COLONOSCOPY WITH PROPOFOL N/A 08/18/2016   Procedure: COLONOSCOPY WITH PROPOFOL;  Surgeon: Manya Silvas, MD;  Location: [facility] ENDOSCOPY;  Service: Endoscopy;  Laterality: N/A;  . ESOPHAGOGASTRODUODENOSCOPY (EGD) WITH PROPOFOL N/A 08/18/2016   Procedure: ESOPHAGOGASTRODUODENOSCOPY (EGD) WITH PROPOFOL;  Surgeon: Manya Silvas, MD;  Location: [facility] ENDOSCOPY;  Service: Endoscopy;  Laterality: N/A;  . IR BILIARY DRAIN PLACEMENT WITH CHOLANGIOGRAM  06/08/2017  . IR EXCHANGE BILIARY DRAIN  08/17/2017  . ORIF HUMERUS FRACTURE  09/17/2011   Procedure: OPEN REDUCTION INTERNAL FIXATION (ORIF)  PROXIMAL HUMERUS FRACTURE;  Surgeon: Augustin Schooling, MD;  Location: [place];  Service: Orthopedics;  Laterality: Left;     SOCIAL HISTORY:   Social History   Tobacco Use  . Smoking status: Former Smoker    Packs/day: 1.00    Years: 10.00    Pack years: 10.00    Types: Cigarettes    Last attempt to quit: 12/30/1998    Years since quitting: 18.8  . Smokeless tobacco: Never Used  . Tobacco comment: quit smoking in 2003  Substance Use Topics  . Alcohol use: No     FAMILY HISTORY:   Family History  Problem Relation Age of Onset  . Breast cancer Mother 37  . COPD Father   . Prostate cancer Brother      DRUG ALLERGIES:   Allergies  Allergen Reactions  . Penicillins Nausea Only    Has patient had a PCN reaction causing immediate rash, facial/tongue/throat swelling, SOB or lightheadedness with hypotension: No Has patient had a PCN reaction causing severe rash involving mucus membranes or skin necrosis: No Has patient had a PCN reaction that required hospitalization: No Has patient had a PCN reaction occurring within the last 10 years: No If all of the above answers are "NO", then may proceed with Cephalosporin use.    MEDICATIONS AT HOME:   Prior to Admission medications   Medication Sig Start Date End Date Taking? Authorizing Provider  acetaminophen (TYLENOL) 500 MG tablet Take  1,000 mg by mouth 3 (three) times daily.   Yes [provider]  azaTHIOprine (IMURAN) 50 MG tablet Take 50 mg by mouth daily.   Yes [provider]  Cyanocobalamin (VITAMIN B-12 PO) Take 1,000 mcg by mouth daily.   Yes [provider]  hydroxychloroquine (PLAQUENIL) 200 MG tablet Take 200 mg by mouth 2 (two) times daily.    Yes [provider]  magnesium chloride (SLOW-MAG) 64 MG TBEC SR tablet Take 1 tablet (64 mg total) by mouth daily. 07/13/17  Yes Jacquelin Hawking, NP  predniSONE (DELTASONE) 5 MG tablet Take 1 tablet (5 mg total) by mouth daily with breakfast.  08/19/17  Yes Vaughan Basta, MD  Sodium Chloride Flush (NORMAL SALINE FLUSH) 0.9 % SOLN 1 mL by Other route every 8 (eight) hours. Flush each biliary drain with 1 mL twice daily 06/24/17  Yes Earlie Server, MD  triamcinolone cream (KENALOG) 0.1 % Apply 1 application topically daily.  07/20/17  Yes [provider]  diphenoxylate-atropine (LOMOTIL) 2.5-0.025 MG tablet Take 2 tablets by mouth 4 (four) times daily as needed for diarrhea or loose stools. Patient not taking: Reported on 11/01/2017 07/07/17   Jacquelin Hawking, NP  feeding supplement, ENSURE ENLIVE, (ENSURE ENLIVE) LIQD Take 237 mLs by mouth 2 (two) times daily between meals. 08/18/17   Vaughan Basta, MD  Fluticasone-Salmeterol (ADVAIR DISKUS) 100-50 MCG/DOSE AEPB Inhale 1 puff into the lungs 2 (two) times daily. 07/18/17   Flora Lipps, MD  lidocaine-prilocaine (EMLA) cream Apply 1 application topically as needed. 02/16/17   Cammie Sickle, MD  ondansetron (ZOFRAN) 8 MG tablet TAKE ONE TABLET EVERY 12 HOURS AS NEEDED Patient taking differently: Take 1 tablet [facility]) by mouth every 12 hours as needed for nausea and/or vomiting 04/06/17   Earlie Server, MD  oxyCODONE (OXY IR/ROXICODONE) 5 MG immediate release tablet Take 1 tablet (5 mg total) by mouth every 6 (six) hours as needed for severe pain. Patient not taking: Reported on 11/01/2017 10/14/17   Karen Kitchens, NP  pantoprazole (PROTONIX) 40 MG tablet Take 1 tablet (40 mg total) by mouth daily. Patient not taking: Reported on 11/01/2017 05/23/17   Earlie Server, MD    REVIEW OF SYSTEMS:  Review of Systems  Constitutional: Negative for chills, fever, malaise/fatigue and weight loss.  HENT: Negative for ear pain, hearing loss and tinnitus.   Eyes: Negative for blurred vision, double vision, pain and redness.  Respiratory: Negative for cough, hemoptysis and shortness of breath.   Cardiovascular: Negative for chest pain, palpitations, orthopnea and leg swelling.  Gastrointestinal:  Positive for abdominal pain and nausea. Negative for constipation, diarrhea and vomiting.  Genitourinary: Negative for dysuria, frequency and hematuria.  Musculoskeletal: Negative for back pain, joint pain and neck pain.  Skin:       No acne, rash, or lesions  Neurological: Negative for dizziness, tremors, focal weakness and weakness.  Endo/Heme/Allergies: Negative for polydipsia. Does not bruise/bleed easily.  Psychiatric/Behavioral: Negative for depression. The patient is not nervous/anxious and does not have insomnia.      VITAL SIGNS:   Vitals:   11/01/17 1547 11/01/17 1658 11/01/17 1700 11/01/17 1730  BP:  114/61  119/61  Pulse:   98 97  Resp:      Temp:      TempSrc:      SpO2:   97% 96%  Weight: 59 kg (130 lb)     Height: 5' 3"  (1.6 m)      Wt Readings from  Last 3 Encounters:  11/01/17 59 kg (130 lb)  10/14/17 63.5 kg (140 lb)  08/18/17 71.9 kg (158 lb 8 oz)    PHYSICAL EXAMINATION:  Physical Exam  Vitals reviewed. Constitutional: She is oriented to person, place, and time. She appears well-developed and well-nourished. No distress.  HENT:  Head: Normocephalic and atraumatic.  Mouth/Throat: Oropharynx is clear and moist.  Eyes: Pupils are equal, round, and reactive to light. Conjunctivae and EOM are normal. No scleral icterus.  Neck: Normal range of motion. Neck supple. No JVD present. No thyromegaly present.  Cardiovascular: Normal rate, regular rhythm and intact distal pulses. Exam reveals no gallop and no friction rub.  No murmur heard. Respiratory: Effort normal and breath sounds normal. No respiratory distress. She has no wheezes. She has no rales.  GI: Soft. Bowel sounds are normal. She exhibits no distension. There is tenderness.  Musculoskeletal: Normal range of motion. She exhibits no edema.  No arthritis, no gout  Lymphadenopathy:    She has no cervical adenopathy.  Neurological: She is alert and oriented to person, place, and time. No cranial nerve  deficit.  No dysarthria, no aphasia  Skin: Skin is warm and dry. No rash noted. No erythema.  Psychiatric: She has a normal mood and affect. Her behavior is normal. Judgment and thought content normal.    LABORATORY PANEL:   CBC Recent Labs  Lab 11/01/17 1709  WBC 11.3*  HGB 10.2*  HCT 29.3*  PLT 301   ------------------------------------------------------------------------------------------------------------------  Chemistries  Recent Labs  Lab 11/01/17 1709  NA 133*  K 2.4*  CL 90*  CO2 31  GLUCOSE 90  BUN 10  CREATININE 0.98  CALCIUM 7.7*  AST 184*  ALT 72*  ALKPHOS 393*  BILITOT 3.7*   ------------------------------------------------------------------------------------------------------------------  Cardiac Enzymes No results for input(s): TROPONINI in the last 168 hours. ------------------------------------------------------------------------------------------------------------------  RADIOLOGY:  Ct Abdomen Pelvis W Contrast  Result Date: 11/01/2017 CLINICAL DATA:  Pt has bilary tubes and states they need to be changed. Pt states the hospice nurse told her yesterday to have the tubes changed. Pt also has a foley cath x 3 months. Pt also reports nausea and generalized abd pain. EXAM: CT ABDOMEN AND PELVIS WITH CONTRAST TECHNIQUE: Multidetector CT imaging of the abdomen and pelvis was performed using the standard protocol following bolus administration of intravenous contrast. CONTRAST:  116m OMNIPAQUE IOHEXOL 300 MG/ML  SOLN COMPARISON:  CT abdomen 08/15/2017 FINDINGS: Lower chest: Calcified pleural plaques in the lung bases. Mitral valve calcification. Lower periaortic lymph node 0.8 cm in short axis on image 9/2, previously 0.9 cm in short axis. Scattered small pericardial lymph nodes are likewise similar to prior. Hepatobiliary: Right and left percutaneous biliary drains are present, extending through the hepatic parenchyma and into the common hepatic duct and  common bile duct to coil in the duodenum. The entire left hepatic lobe is notably atrophic, similar to the prior exam. Minimally contracted gallbladder. There is only mild intrahepatic biliary dilatation bilaterally, although slightly more than on 08/15/2017. Pancreas: Unremarkable Spleen: Unremarkable Adrenals/Urinary Tract: Foley catheter present in the decompressed urinary bladder. Small amount of gas in the urinary bladder. No hydronephrosis or hydroureter. Renal parenchymal enhancement normal. Stomach/Bowel: Upper normal wall thickness of the appendix. Mild wall thickening of the ascending colon. Mild sigmoid colon diverticulosis without active diverticulitis. No dilated bowel observed. Vascular/Lymphatic: Aortoiliac atherosclerotic vascular disease. Small uphill varices adjacent to the distal esophagus and stomach. Scattered small retroperitoneal lymph nodes are not pathologically enlarged by size criteria.  Reproductive: Uterus absent. Small amount of gas in the vagina. Adnexa unremarkable. Other: New nodularity along the omentum with about 11 discrete nodules observed. One of the larger nodules is indistinctly marginated and measures 1.0 by 1.1 cm on image 36/2. presacral edema is present but is actually reduced compared to the prior exam. Musculoskeletal: Lumbar spondylosis and degenerative disc disease with dextroconvex lumbar scoliosis. IMPRESSION: 1. New multifocal nodularity along the omentum. This can be inflammatory, but can also commonly be encountered in the setting of peritoneal/omental spread of malignancy, often from ovarian or gastrointestinal mucinous types. Given the patient's history of cholangiocarcinoma, early spread along the omentum is raised as a possibility. 2. The biliary tubes remain satisfactorily position. There is some minimal intrahepatic biliary dilatation. Chronic atrophy of the left hepatic lobe. 3. Mild wall thickening of the ascending colon over a long segment, suggesting mild  colitis. 4. Small uphill varices adjacent to the distal esophagus, suggesting portal venous hypertension. 5. Other imaging findings of potential clinical significance: Calcified pleural plaques suggesting remote asbestos exposure. Mitral valve calcification. Scattered upper normal sized lower thoracic and upper abdominal lymph nodes. Small amount of gas in the urinary bladder, potentially introduced by the Foley catheter. Mild sigmoid colon diverticulosis. Aortic Atherosclerosis (ICD10-I70.0). Dextroconvex lumbar scoliosis. Electronically Signed   By: Van Clines M.D.   On: 11/01/2017 18:44    EKG:   Orders placed or performed during the hospital encounter of 08/12/17  . ED EKG 12-Lead  . ED EKG 12-Lead    IMPRESSION AND PLAN:  Principal Problem:   Cholangiocarcinoma ([place]) -with biliary drain malfunction, IR plans for biliary drain exchange. Active Problems:   Hypokalemia -replete and monitor   Diabetes (HCC) -sliding scale insulin with corresponding glucose checks   Hypertension -continue home meds   Sarcoidosis -home dose immunosuppressive's   Anxiety and depression -home dose antidepressant and anxiolytic  Chart review performed and case discussed with ED provider. Labs, imaging and/or ECG reviewed by provider and discussed with patient/family. Management plans discussed with the patient and/or family.  DVT PROPHYLAXIS: SubQ lovenox  GI PROPHYLAXIS: None  ADMISSION STATUS: Observation  CODE STATUS: DNR Code Status History    Date Active Date Inactive Code Status Order ID Comments User Context   08/12/2017 2256 08/18/2017 1902 DNR [id number]  Amelia Jo, MD Inpatient   08/07/2017 0233 08/10/2017 2140 DNR [id number]  Lance Coon, MD ED   06/07/2017 1308 06/09/2017 1715 DNR [id number]  Max Sane, MD Inpatient   06/07/2017 1200 06/07/2017 1308 Full Code [id number]  Max Sane, MD Inpatient   05/04/2017 1612 05/05/2017 2249 DNR [id number]  Bettey Costa, MD Inpatient   05/04/2017 1546  05/04/2017 1612 Full Code [id number]  Bettey Costa, MD Inpatient   04/27/2017 1826 04/30/2017 2200 DNR [id number]  Vaughan Basta, MD Inpatient   03/16/2017 1712 03/17/2017 1608 DNR [id number]  Henreitta Leber, MD Inpatient   11/13/2016 0301 11/14/2016 1412 DNR [id number]  Harrie Foreman, MD Inpatient   06/29/2016 1533 07/01/2016 1922 DNR [id number]  Hillary Bow, MD ED   09/17/2011 1443 09/19/2011 1624 Full Code [id number]  [place], Jeani Hawking Rudder, RN Inpatient    Questions for Most Recent Historical Code Status (Order [id number])    Question Answer Comment   In the event of cardiac or respiratory ARREST Do not call a "code blue"    In the event of cardiac or respiratory ARREST Do not perform Intubation, CPR, defibrillation or ACLS    In the event of cardiac or respiratory ARREST Use medication  by any route, position, wound care, and other measures to relive pain and suffering. May use oxygen, suction and manual treatment of airway obstruction as needed for comfort.       TOTAL TIME TAKING CARE OF THIS PATIENT: 40 minutes.   Jasreet Dickie McLennan 11/01/2017, 9:10 PM  [employer]  [phone]  CC: Primary care physician; Idelle Crouch, MD  Note:  This document was prepared using Dragon voice recognition software and may include unintentional dictation errors.

## 2017-11-01 NOTE — ED Notes (Signed)
Pt sitting up on side of bed; Sandwich tray and sprite given to pt at request and Dr's OK

## 2017-11-01 NOTE — ED Notes (Signed)
Patient transported to X-ray 

## 2017-11-01 NOTE — ED Notes (Signed)
Spoke with pt about wait times and what to expect next. Advised pt that I am available for further questions if needed.

## 2017-11-01 NOTE — Telephone Encounter (Signed)
Patient gets her biliary tube exchanged at [facility].   Spoke with patient, she does not feel that the tube needs to changed at this time and does not feel like going to [facility] for this procedure at this time.

## 2017-11-01 NOTE — Telephone Encounter (Signed)
Patient's left biliary tube is still draining and flushing ok, but sutures are coming loose; right biliary drain also has loose sutures and is not draining and flushing properly and patient has drainage around the tube leaking out. Patient is complaining of severe pain on the right side. Patient does not want to go to the ED. Please advise.       dhs

## 2017-11-01 NOTE — ED Provider Notes (Signed)
[facility] Emergency Department Provider Note  ____________________________________________  Time seen: Approximately 6:05 PM  I have reviewed the triage vital signs and the nursing notes.   HISTORY  Chief Complaint Biliary Tube not Draining  Level 5 Caveat: Portions of the History and Physical including HPI and review of systems are unable to be completely obtained due to patient being a poor historian    HPI Regina Hood is a 78 y.o. female with a history of cholangiocarcinoma, diabetes, hypertension, lupus who comes to the ED today due to needing her biliary drainage tube replaced.  Patient is under hospice care.    She is not sure when, but the suture came loose on 1 of her biliary drainage tubes overlying the right upper quadrant and it was pulled out partially.  Since then it is been checked by her visiting home nurse, who was found that it does not flush, does not drain.   The drainage tube near the midline of her abdomen still flushes and drains.  Denies fevers or chills or sweats.  She feels weak and has worsening right upper quadrant abdominal pain since yesterday.    Past Medical History:  Diagnosis Date  . Arthritis   . Cancer ([place])   . Cellulitis   . Collagen vascular disease ([place])   . COPD (chronic obstructive pulmonary disease) ([place])   . DDD (degenerative disc disease), lumbar   . Depression   . Diabetes mellitus   . Diverticulitis   . GERD (gastroesophageal reflux disease)   . GI bleed   . Headache(784.0)   . Hypertension   . Lupus ([place])   . Ulcerative colitis [facility])      Patient Active Problem List   Diagnosis Date Noted  . Cutaneous lupus erythematosus   . Malnutrition of moderate degree 06/09/2017  . Generalized abdominal pain   . Hyperbilirubinemia   . Hypomagnesemia   . Hypokalemia 04/27/2017  . Proteinuria 03/31/2017  . COPD exacerbation ([place]) 03/16/2017  . Dehydration 01/19/2017  . History of iron deficiency anemia  12/29/2016  . Systemic lupus erythematosus ([place]) 12/29/2016  . Cholangiocarcinoma ([place]) 12/29/2016  . History of ulcerative colitis 12/29/2016  . Goals of care, counseling/discussion 12/29/2016  . Severe sepsis ([place]) 11/13/2016  . At risk for sepsis 09/29/2016  . Red blood cell antibody positive, compatible PRBC difficult to obtain 09/22/2016  . Type 2 diabetes mellitus without complication, without long-term current use of insulin ([place]) 09/17/2016  . Liver mass, left lobe 08/23/2016  . Elevated liver enzymes 08/19/2016  . Pruritic rash 08/19/2016  . IDA (iron deficiency anemia) 07/18/2016  . Cellulitis of right upper extremity 07/05/2016  . History of rectal bleeding 07/05/2016  . Rectal bleeding 06/29/2016  . Hypertension 01/07/2016  . Anxiety and depression 09/26/2013  . DDD (degenerative disc disease) 09/26/2013  . Diabetes ([place]) 09/26/2013  . Sarcoidosis 09/26/2013  . UC (ulcerative colitis) ([place]) 09/26/2013  . Proximal humerus fracture, left, closed, initial encounter 09/17/2011     Past Surgical History:  Procedure Laterality Date  . ABDOMINAL HYSTERECTOMY    . BREAST BIOPSY Right   . COLONOSCOPY    . COLONOSCOPY WITH PROPOFOL N/A 08/18/2016   Procedure: COLONOSCOPY WITH PROPOFOL;  Surgeon: Manya Silvas, MD;  Location: [facility] ENDOSCOPY;  Service: Endoscopy;  Laterality: N/A;  . ESOPHAGOGASTRODUODENOSCOPY (EGD) WITH PROPOFOL N/A 08/18/2016   Procedure: ESOPHAGOGASTRODUODENOSCOPY (EGD) WITH PROPOFOL;  Surgeon: Manya Silvas, MD;  Location: [facility] ENDOSCOPY;  Service: Endoscopy;  Laterality: N/A;  .  IR BILIARY DRAIN PLACEMENT WITH CHOLANGIOGRAM  06/08/2017  . IR EXCHANGE BILIARY DRAIN  08/17/2017  . ORIF HUMERUS FRACTURE  09/17/2011   Procedure: OPEN REDUCTION INTERNAL FIXATION (ORIF) PROXIMAL HUMERUS FRACTURE;  Surgeon: Augustin Schooling, MD;  Location: [facility];  Service: Orthopedics;  Laterality: Left;     Prior to Admission medications   Medication Sig Start Date End Date  Taking? Authorizing Provider  acetaminophen (TYLENOL) 500 MG tablet Take 1,000 mg by mouth 3 (three) times daily.   Yes [provider]  azaTHIOprine (IMURAN) 50 MG tablet Take 50 mg by mouth daily.   Yes [provider]  Cyanocobalamin (VITAMIN B-12 PO) Take 1,000 mcg by mouth daily.   Yes [provider]  hydroxychloroquine (PLAQUENIL) 200 MG tablet Take 200 mg by mouth 2 (two) times daily.    Yes [provider]  magnesium chloride (SLOW-MAG) 64 MG TBEC SR tablet Take 1 tablet (64 mg total) by mouth daily. 07/13/17  Yes Jacquelin Hawking, NP  predniSONE (DELTASONE) 5 MG tablet Take 1 tablet (5 mg total) by mouth daily with breakfast. 08/19/17  Yes Vaughan Basta, MD  Sodium Chloride Flush (NORMAL SALINE FLUSH) 0.9 % SOLN 1 mL by Other route every 8 (eight) hours. Flush each biliary drain with 1 mL twice daily 06/24/17  Yes Earlie Server, MD  triamcinolone cream (KENALOG) 0.1 % Apply 1 application topically daily.  07/20/17  Yes [provider]  diphenoxylate-atropine (LOMOTIL) 2.5-0.025 MG tablet Take 2 tablets by mouth 4 (four) times daily as needed for diarrhea or loose stools. Patient not taking: Reported on 11/01/2017 07/07/17   Jacquelin Hawking, NP  feeding supplement, ENSURE ENLIVE, (ENSURE ENLIVE) LIQD Take 237 mLs by mouth 2 (two) times daily between meals. 08/18/17   Vaughan Basta, MD  Fluticasone-Salmeterol (ADVAIR DISKUS) 100-50 MCG/DOSE AEPB Inhale 1 puff into the lungs 2 (two) times daily. 07/18/17   [place] Lipps, MD  lidocaine-prilocaine (EMLA) cream Apply 1 application topically as needed. 02/16/17   Cammie Sickle, MD  ondansetron (ZOFRAN) 8 MG tablet TAKE ONE TABLET EVERY 12 HOURS AS NEEDED Patient taking differently: Take 1 tablet [facility]) by mouth every 12 hours as needed for nausea and/or vomiting 04/06/17   Earlie Server, MD  oxyCODONE (OXY IR/ROXICODONE) 5 MG immediate release tablet Take 1 tablet (5 mg total) by mouth every 6 (six)  hours as needed for severe pain. Patient not taking: Reported on 11/01/2017 10/14/17   Karen Kitchens, NP  pantoprazole (PROTONIX) 40 MG tablet Take 1 tablet (40 mg total) by mouth daily. Patient not taking: Reported on 11/01/2017 05/23/17   Earlie Server, MD     Allergies Penicillins   Family History  Problem Relation Age of Onset  . Breast cancer Mother 67  . COPD Father   . Prostate cancer Brother     Social History Social History   Tobacco Use  . Smoking status: Former Smoker    Packs/day: 1.00    Years: 10.00    Pack years: 10.00    Types: Cigarettes    Last attempt to quit: 12/30/1998    Years since quitting: 18.8  . Smokeless tobacco: Never Used  . Tobacco comment: quit smoking in 2003  Substance Use Topics  . Alcohol use: No  . Drug use: No    Review of Systems  Constitutional:   No fever or chills.  ENT:   No sore throat. No rhinorrhea. Cardiovascular:   No chest pain or syncope. Respiratory:  No dyspnea or cough. Gastrointestinal:   Positive as above for right upper quadrant abdominal pain without vomiting and diarrhea.  Musculoskeletal:   Negative for focal pain or swelling All other systems reviewed and are negative except as documented above in ROS and HPI.  ____________________________________________   PHYSICAL EXAM:  VITAL SIGNS: ED Triage Vitals  Enc Vitals Group     BP 11/01/17 1544 118/60     Pulse Rate 11/01/17 1544 (!) 104     Resp 11/01/17 1544 15     Temp 11/01/17 1544 98.7 F (37.1 C)     Temp Source 11/01/17 1544 Oral     SpO2 11/01/17 1544 99 %     Weight 11/01/17 1547 130 lb (59 kg)     Height 11/01/17 1547 5' 3"  (1.6 m)     Head Circumference --      Peak Flow --      Pain Score 11/01/17 1547 0     Pain Loc --      Pain Edu? --      Excl. in [place]? --     Vital signs reviewed, nursing assessments reviewed.   Constitutional:   Alert and oriented.  Ill-appearing Eyes:   Conjunctivae are normal. EOMI. PERRL. ENT      Head:    Normocephalic and atraumatic.      Nose:   No congestion/rhinnorhea.       Mouth/Throat:   Dry mucous membranes, no pharyngeal erythema. No peritonsillar mass.       Neck:   No meningismus. Full ROM. Hematological/Lymphatic/Immunilogical:   No cervical lymphadenopathy. Cardiovascular:   Tachycardia heart rate 105. Symmetric bilateral radial and DP pulses.  No murmurs. Cap refill less than 2 seconds. Respiratory:   Normal respiratory effort without tachypnea/retractions. Breath sounds are clear and equal bilaterally. No wheezes/rales/rhonchi. Gastrointestinal:   Soft with diffuse upper abdominal tenderness.. Non distended.  No rebound, rigidity, or guarding.  Percutaneous drains in the midline upper abdomen with intact anchor suture but somewhat loosened.  There is drainage into the collection bag. Percutaneous drain tube in the right upper quadrant has anchor suture that is broken free.  Appears to been displaced by 2 to 3 cm.  Not currently draining.  Both collection bags full of bilious fluid Genitourinary:   Foley catheter in place, otherwise unremarkable Musculoskeletal:   Normal range of motion in all extremities. No joint effusions.  No lower extremity tenderness.  No edema. Neurologic:   Normal speech and language.  Motor grossly intact. No acute focal neurologic deficits are appreciated.  Skin:    Skin is warm, dry and intact. No rash noted.  No petechiae, purpura, or bullae.  ____________________________________________    LABS (pertinent positives/negatives) (all labs ordered are listed, but only abnormal results are displayed) Labs Reviewed  COMPREHENSIVE METABOLIC PANEL - Abnormal; Notable for the following components:      Result Value   Sodium 133 (*)    Potassium 2.4 (*)    Chloride 90 (*)    Calcium 7.7 (*)    Albumin 2.7 (*)    AST 184 (*)    ALT 72 (*)    Alkaline Phosphatase 393 (*)    Total Bilirubin 3.7 (*)    GFR calc non Af Amer 55 (*)    All other components  within normal limits  CBC WITH DIFFERENTIAL/PLATELET - Abnormal; Notable for the following components:   WBC 11.3 (*)    RBC 3.25 (*)    Hemoglobin 10.2 (*)  HCT 29.3 (*)    RDW 17.4 (*)    Neutro Abs 9.2 (*)    Lymphs Abs 0.8 (*)    Monocytes Absolute 1.2 (*)    All other components within normal limits  PROTIME-INR - Abnormal; Notable for the following components:   Prothrombin Time 16.1 (*)    All other components within normal limits  APTT   ____________________________________________   EKG    ____________________________________________    RADIOLOGY  No results found.  ____________________________________________   PROCEDURES .Critical Care Performed by: Carrie Mew, MD Authorized by: Carrie Mew, MD   Critical care provider statement:    Critical care time (minutes):  30   Critical care time was exclusive of:  Separately billable procedures and treating other patients   Critical care was necessary to treat or prevent imminent or life-threatening deterioration of the following conditions:  Metabolic crisis   Critical care was time spent personally by me on the following activities:  Development of treatment plan with patient or surrogate, discussions with consultants, evaluation of patient's response to treatment, examination of patient, obtaining history from patient or surrogate, ordering and performing treatments and interventions, ordering and review of laboratory studies, ordering and review of radiographic studies, pulse oximetry, re-evaluation of patient's condition and review of old charts    ____________________________________________  DIFFERENTIAL DIAGNOSIS   Bile leak, peritonitis, biliary obstruction from dislodged percutaneous biliary drain.  Persistent cholangiocarcinoma  CLINICAL IMPRESSION / ASSESSMENT AND PLAN / ED COURSE  Pertinent labs & imaging results that were available during my care of the patient were reviewed by me  and considered in my medical decision making (see chart for details).      Clinical Course as of Nov 02 1803  Tue Nov 01, 2017  1708 Patient with a history of cholangiocarcinoma, under hospice care, presents with dislodged percutaneous biliary drainage tube overlying right upper quadrant.  Check labs.  Will need replacement tube by interventional radiology.   [PS]  9735 Labs show severe hypokalemia as well as diffusely elevated LFTs consistent with biliary obstruction from dislodged percutaneous biliary drain.  Plan to hospitalize overnight for replacement of potassium, hydration, preparation for IR replacement of percutaneous drains.  Patient is agreeable to hospitalization at this time.  Potassium(!!): 2.4 [PS]    Clinical Course User Index [PS] Carrie Mew, MD     ----------------------------------------- 6:12 PM on 11/01/2017 -----------------------------------------  IV potassium x6 rounds ordered for hypokalemia.  Gentle IV fluids for continued hydration.  Awaiting CT scan to evaluate for free fluid/bile leak or signs of peritonitis.  Afterward we will plan to admit to hospitalist.  Case discussed with Dr. Pascal Lux of interventional radiology who will be at [facility] tomorrow and can exchange her percutaneous biliary drains.   ----------------------------------------- 8:34 PM on 11/01/2017 -----------------------------------------  CT scan without evidence of acute infectious process or large bile leak.  Although CT report indicates that both biliary drains are in appropriate position, the right upper quadrant exiting 1 is functionally displaced since it does not flush or drain and has been removed by 2 to 3 cm at the skin surface.  Discussed with hospitalist for further management. ____________________________________________   FINAL CLINICAL IMPRESSION(S) / ED DIAGNOSES    Final diagnoses:  Cholangiocarcinoma ([place])  Biliary drain displacement, initial encounter   Hypokalemia  Biliary obstruction due to cancer [facility])     ED Discharge Orders    None      Portions of this note were generated with dragon dictation software. Dictation  errors may occur despite best attempts at proofreading.    Carrie Mew, MD 11/01/17 2035

## 2017-11-01 NOTE — Telephone Encounter (Signed)
Spoke to Dr. Tasia Catchings regarding this patient and she will arrange to have appointment made through IR to have biliary tubes exchanged. Message sent to Geraldine Solar, CMA and Janeann Merl, RN to follow up on this.      dhs

## 2017-11-01 NOTE — ED Notes (Signed)
Pt reports pain due to infusing K+ to left wrist SL; site without redness or swelling and good blood return noted; pt requests a new IV site placed; IV attempt to right FA without success; upon further questioning pt st that she does have a portacath in place but did not mention to the nurse; informed pt on importance of notifying nurse or lab tech upon their need to obtain blood draw or IV access so that it can be accessed to avoid unnecessary IV sticks; pt voices good understanding, st "she just didn't want to bother anyone"; again stressed importance of informing medical staff for her comfort; pt voices understanding

## 2017-11-01 NOTE — Telephone Encounter (Signed)
Spoke to [place], [place] with Hospice and informed her of patient's response.    dhs

## 2017-11-01 NOTE — ED Notes (Signed)
Report off to lisa rn

## 2017-11-01 NOTE — ED Notes (Signed)
Lattie Haw RN, aware of bed assigned

## 2017-11-01 NOTE — ED Triage Notes (Signed)
Pt states that she needs "biliary tubes replaced" - pt reports that the sutures have come loose and that it is not draining properly - Dr Tasia Catchings called report to [facility] but pt came here

## 2017-11-02 ENCOUNTER — Inpatient Hospital Stay

## 2017-11-02 ENCOUNTER — Encounter: Payer: Self-pay | Admitting: Interventional Radiology

## 2017-11-02 DIAGNOSIS — Z825 Family history of asthma and other chronic lower respiratory diseases: Secondary | ICD-10-CM | POA: Diagnosis not present

## 2017-11-02 DIAGNOSIS — F329 Major depressive disorder, single episode, unspecified: Secondary | ICD-10-CM | POA: Diagnosis present

## 2017-11-02 DIAGNOSIS — K831 Obstruction of bile duct: Secondary | ICD-10-CM | POA: Diagnosis present

## 2017-11-02 DIAGNOSIS — T85520A Displacement of bile duct prosthesis, initial encounter: Secondary | ICD-10-CM | POA: Diagnosis present

## 2017-11-02 DIAGNOSIS — Z7951 Long term (current) use of inhaled steroids: Secondary | ICD-10-CM | POA: Diagnosis not present

## 2017-11-02 DIAGNOSIS — I10 Essential (primary) hypertension: Secondary | ICD-10-CM | POA: Diagnosis not present

## 2017-11-02 DIAGNOSIS — Z6823 Body mass index (BMI) 23.0-23.9, adult: Secondary | ICD-10-CM | POA: Diagnosis not present

## 2017-11-02 DIAGNOSIS — D869 Sarcoidosis, unspecified: Secondary | ICD-10-CM | POA: Diagnosis present

## 2017-11-02 DIAGNOSIS — E876 Hypokalemia: Secondary | ICD-10-CM | POA: Diagnosis not present

## 2017-11-02 DIAGNOSIS — L931 Subacute cutaneous lupus erythematosus: Secondary | ICD-10-CM | POA: Diagnosis present

## 2017-11-02 DIAGNOSIS — K219 Gastro-esophageal reflux disease without esophagitis: Secondary | ICD-10-CM | POA: Diagnosis present

## 2017-11-02 DIAGNOSIS — Y732 Prosthetic and other implants, materials and accessory gastroenterology and urology devices associated with adverse incidents: Secondary | ICD-10-CM | POA: Diagnosis present

## 2017-11-02 DIAGNOSIS — K5190 Ulcerative colitis, unspecified, without complications: Secondary | ICD-10-CM | POA: Diagnosis present

## 2017-11-02 DIAGNOSIS — J449 Chronic obstructive pulmonary disease, unspecified: Secondary | ICD-10-CM | POA: Diagnosis present

## 2017-11-02 DIAGNOSIS — Z8042 Family history of malignant neoplasm of prostate: Secondary | ICD-10-CM | POA: Diagnosis not present

## 2017-11-02 DIAGNOSIS — Z87891 Personal history of nicotine dependence: Secondary | ICD-10-CM | POA: Diagnosis not present

## 2017-11-02 DIAGNOSIS — E119 Type 2 diabetes mellitus without complications: Secondary | ICD-10-CM | POA: Diagnosis not present

## 2017-11-02 DIAGNOSIS — C221 Intrahepatic bile duct carcinoma: Secondary | ICD-10-CM | POA: Diagnosis not present

## 2017-11-02 DIAGNOSIS — Z90710 Acquired absence of both cervix and uterus: Secondary | ICD-10-CM | POA: Diagnosis not present

## 2017-11-02 DIAGNOSIS — Z66 Do not resuscitate: Secondary | ICD-10-CM | POA: Diagnosis present

## 2017-11-02 DIAGNOSIS — Z7952 Long term (current) use of systemic steroids: Secondary | ICD-10-CM | POA: Diagnosis not present

## 2017-11-02 DIAGNOSIS — F419 Anxiety disorder, unspecified: Secondary | ICD-10-CM | POA: Diagnosis present

## 2017-11-02 DIAGNOSIS — M5136 Other intervertebral disc degeneration, lumbar region: Secondary | ICD-10-CM | POA: Diagnosis present

## 2017-11-02 DIAGNOSIS — Z803 Family history of malignant neoplasm of breast: Secondary | ICD-10-CM | POA: Diagnosis not present

## 2017-11-02 DIAGNOSIS — E440 Moderate protein-calorie malnutrition: Secondary | ICD-10-CM | POA: Diagnosis present

## 2017-11-02 HISTORY — PX: IR EXCHANGE BILIARY DRAIN: IMG6046

## 2017-11-02 LAB — BASIC METABOLIC PANEL
Anion gap: 8 (ref 5–15)
BUN: 8 mg/dL (ref 8–23)
CO2: 32 mmol/L (ref 22–32)
CREATININE: 0.67 mg/dL (ref 0.44–1.00)
Calcium: 7.5 mg/dL — ABNORMAL LOW (ref 8.9–10.3)
Chloride: 96 mmol/L — ABNORMAL LOW (ref 98–111)
GFR calc non Af Amer: >60 mL/min (ref >60)
GLUCOSE: 124 mg/dL — AB (ref 70–99)
Potassium: 2.5 mmol/L — CL (ref 3.5–5.1)
Sodium: 136 mmol/L (ref 135–145)

## 2017-11-02 LAB — CBC
HEMATOCRIT: 28.6 % — AB (ref 35.0–47.0)
Hemoglobin: 9.7 g/dL — ABNORMAL LOW (ref 12.0–16.0)
MCH: 30.8 pg (ref 26.0–34.0)
MCHC: 33.8 g/dL (ref 32.0–36.0)
MCV: 91.1 fL (ref 80.0–100.0)
Platelets: 282 10*3/uL (ref 150–440)
RBC: 3.14 MIL/uL — ABNORMAL LOW (ref 3.80–5.20)
RDW: 17.8 % — AB (ref 11.5–14.5)
WBC: 8.1 10*3/uL (ref 3.6–11.0)

## 2017-11-02 LAB — GLUCOSE, CAPILLARY
Glucose-Capillary: 61 mg/dL — ABNORMAL LOW (ref 70–99)
Glucose-Capillary: 113 mg/dL — ABNORMAL HIGH (ref 70–99)

## 2017-11-02 LAB — MAGNESIUM: MAGNESIUM: 1.2 mg/dL — AB (ref 1.7–2.4)

## 2017-11-02 MED ORDER — ZOLPIDEM TARTRATE 5 MG PO TABS
5.0000 mg | ORAL_TABLET | Freq: Every evening | ORAL | Status: DC | PRN
  Administered 2017-11-02: 5 mg via ORAL
  Filled 2017-11-02: qty 1

## 2017-11-02 MED ORDER — VANCOMYCIN HCL IN DEXTROSE 1-5 GM/200ML-% IV SOLN
1000.0000 mg | Freq: Once | INTRAVENOUS | Status: AC
  Administered 2017-11-02: 15:00:00 1000 mg via INTRAVENOUS
  Filled 2017-11-02: qty 200

## 2017-11-02 MED ORDER — SODIUM CHLORIDE 0.9% FLUSH
5.0000 mL | Freq: Three times a day (TID) | INTRAVENOUS | Status: DC
  Administered 2017-11-02 – 2017-11-03 (×3): 5 mL

## 2017-11-02 MED ORDER — POTASSIUM CHLORIDE 10 MEQ/100ML IV SOLN
10.0000 meq | INTRAVENOUS | Status: AC
  Administered 2017-11-02 (×2): 10 meq via INTRAVENOUS
  Filled 2017-11-02 (×2): qty 100

## 2017-11-02 MED ORDER — MIDAZOLAM HCL 5 MG/5ML IJ SOLN
INTRAMUSCULAR | Status: AC | PRN
  Administered 2017-11-02 (×2): 0.5 mg via INTRAVENOUS
  Administered 2017-11-02: 1 mg via INTRAVENOUS
  Administered 2017-11-02 (×2): 0.5 mg via INTRAVENOUS

## 2017-11-02 MED ORDER — POTASSIUM CHLORIDE IN NACL 40-0.9 MEQ/L-% IV SOLN
INTRAVENOUS | Status: DC
  Administered 2017-11-02: 13:00:00 75 mL/h via INTRAVENOUS
  Filled 2017-11-02 (×3): qty 1000

## 2017-11-02 MED ORDER — SODIUM CHLORIDE 0.9 % IV SOLN
INTRAVENOUS | Status: DC
  Administered 2017-11-02: 15:00:00 via INTRAVENOUS

## 2017-11-02 MED ORDER — LIDOCAINE HCL (PF) 1 % IJ SOLN
INTRAMUSCULAR | Status: AC | PRN
  Administered 2017-11-02: 10 mL

## 2017-11-02 MED ORDER — FENTANYL CITRATE (PF) 100 MCG/2ML IJ SOLN
INTRAMUSCULAR | Status: AC
  Filled 2017-11-02: qty 4

## 2017-11-02 MED ORDER — IOPAMIDOL (ISOVUE-300) INJECTION 61%
30.0000 mL | Freq: Once | INTRAVENOUS | Status: AC | PRN
  Administered 2017-11-02: 10 mL

## 2017-11-02 MED ORDER — MIDAZOLAM HCL 5 MG/5ML IJ SOLN
INTRAMUSCULAR | Status: AC
  Filled 2017-11-02: qty 5

## 2017-11-02 MED ORDER — VANCOMYCIN HCL IN DEXTROSE 1-5 GM/200ML-% IV SOLN
1000.0000 mg | Freq: Once | INTRAVENOUS | Status: AC
  Administered 2017-11-03: 03:00:00 1000 mg via INTRAVENOUS
  Filled 2017-11-02: qty 200

## 2017-11-02 MED ORDER — FENTANYL CITRATE (PF) 100 MCG/2ML IJ SOLN
INTRAMUSCULAR | Status: AC | PRN
  Administered 2017-11-02 (×2): 25 ug via INTRAVENOUS
  Administered 2017-11-02: 50 ug via INTRAVENOUS

## 2017-11-02 MED ORDER — MAGNESIUM SULFATE 4 GM/100ML IV SOLN
4.0000 g | Freq: Once | INTRAVENOUS | Status: AC
  Administered 2017-11-02: 19:00:00 4 g via INTRAVENOUS
  Filled 2017-11-02: qty 100

## 2017-11-02 NOTE — Progress Notes (Signed)
Visit made. Patient is currently followed by [facility] at home with a diagnosis of Liver Cancer. She is a DNR code with out of facility DNR in place. Patient came to the [facility] ED on 7/16 for assessment of a possible malfunctioning biliary drain.  Patient seen lying in bed, alert and quietly interactive. Reports being hungry,current plan is for replacement of biliary drain in IR this afternoon. Emotional support given. Transfer summary in place in patient's chart. Will continue to follow and update hospice team. Flo Shanks RN, BSN, [facility] [place], hospital Liaison [phone]

## 2017-11-02 NOTE — Progress Notes (Signed)
[facility] NAME: Regina Hood    MR#:  [id number]  DATE OF BIRTH:  June 05, 1940  SUBJECTIVE:  CHIEF COMPLAINT:   Chief Complaint  Patient presents with  . Biliary Tube not Draining  Patient seen today No abdominal pain No nausea  REVIEW OF SYSTEMS:    ROS  CONSTITUTIONAL: No documented fever. No fatigue, weakness. No weight gain, no weight loss.  EYES: No blurry or double vision.  ENT: No tinnitus. No postnasal drip. No redness of the oropharynx.  RESPIRATORY: No cough, no wheeze, no hemoptysis. No dyspnea.  CARDIOVASCULAR: No chest pain. No orthopnea. No palpitations. No syncope.  GASTROINTESTINAL: No nausea, no vomiting or diarrhea. No abdominal pain. No melena or hematochezia.  GENITOURINARY: No dysuria or hematuria.  ENDOCRINE: No polyuria or nocturia. No heat or cold intolerance.  HEMATOLOGY: No anemia. No bruising. No bleeding.  INTEGUMENTARY: No rashes. No lesions.  MUSCULOSKELETAL: No arthritis. No swelling. No gout.  NEUROLOGIC: No numbness, tingling, or ataxia. No seizure-type activity.  PSYCHIATRIC: No anxiety. No insomnia. No ADD.   DRUG ALLERGIES:   Allergies  Allergen Reactions  . Penicillins Nausea Only    Has patient had a PCN reaction causing immediate rash, facial/tongue/throat swelling, SOB or lightheadedness with hypotension: No Has patient had a PCN reaction causing severe rash involving mucus membranes or skin necrosis: No Has patient had a PCN reaction that required hospitalization: No Has patient had a PCN reaction occurring within the last 10 years: No If all of the above answers are "NO", then may proceed with Cephalosporin use.    VITALS:  Blood pressure 103/63, pulse 94, temperature 98.1 F (36.7 C), temperature source Oral, resp. rate 16, height 5' 2"  (1.575 m), weight 57.4 kg (126 lb 8.7 oz), SpO2 100 %.  PHYSICAL EXAMINATION:   Physical Exam  GENERAL:  77 y.o.-year-old patient lying in  the bed with no acute distress.  EYES: Pupils equal, round, reactive to light and accommodation. No scleral icterus. Extraocular muscles intact.  HEENT: Head atraumatic, normocephalic. Oropharynx dry and nasopharynx clear.  NECK:  Supple, no jugular venous distention. No thyroid enlargement, no tenderness.  LUNGS: Normal breath sounds bilaterally, no wheezing, rales, rhonchi. No use of accessory muscles of respiration.  CARDIOVASCULAR: S1, S2 normal. No murmurs, rubs, or gallops.  ABDOMEN: Soft, nontender, nondistended. Bowel sounds present. No organomegaly or mass.  EXTREMITIES: No cyanosis, clubbing or edema b/l.    NEUROLOGIC: Cranial nerves II through XII are intact. No focal Motor or sensory deficits b/l.   PSYCHIATRIC: The patient is alert and oriented x 3.  SKIN: No obvious rash, lesion, or ulcer.   LABORATORY PANEL:   CBC Recent Labs  Lab 11/02/17 1153  WBC 8.1  HGB 9.7*  HCT 28.6*  PLT 282   ------------------------------------------------------------------------------------------------------------------ Chemistries  Recent Labs  Lab 11/01/17 1709 11/02/17 1153  NA 133* 136  K 2.4* 2.5*  CL 90* 96*  CO2 31 32  GLUCOSE 90 124*  BUN 10 8  CREATININE 0.98 0.67  CALCIUM 7.7* 7.5*  AST 184*  --   ALT 72*  --   ALKPHOS 393*  --   BILITOT 3.7*  --    ------------------------------------------------------------------------------------------------------------------  Cardiac Enzymes No results for input(s): TROPONINI in the last 168 hours. ------------------------------------------------------------------------------------------------------------------  RADIOLOGY:  Ct Abdomen Pelvis W Contrast  Result Date: 11/01/2017 CLINICAL DATA:  Pt has bilary tubes and states they need to be changed. Pt states the hospice nurse told  her yesterday to have the tubes changed. Pt also has a foley cath x 3 months. Pt also reports nausea and generalized abd pain. EXAM: CT ABDOMEN AND  PELVIS WITH CONTRAST TECHNIQUE: Multidetector CT imaging of the abdomen and pelvis was performed using the standard protocol following bolus administration of intravenous contrast. CONTRAST:  186m OMNIPAQUE IOHEXOL 300 MG/ML  SOLN COMPARISON:  CT abdomen 08/15/2017 FINDINGS: Lower chest: Calcified pleural plaques in the lung bases. Mitral valve calcification. Lower periaortic lymph node 0.8 cm in short axis on image 9/2, previously 0.9 cm in short axis. Scattered small pericardial lymph nodes are likewise similar to prior. Hepatobiliary: Right and left percutaneous biliary drains are present, extending through the hepatic parenchyma and into the common hepatic duct and common bile duct to coil in the duodenum. The entire left hepatic lobe is notably atrophic, similar to the prior exam. Minimally contracted gallbladder. There is only mild intrahepatic biliary dilatation bilaterally, although slightly more than on 08/15/2017. Pancreas: Unremarkable Spleen: Unremarkable Adrenals/Urinary Tract: Foley catheter present in the decompressed urinary bladder. Small amount of gas in the urinary bladder. No hydronephrosis or hydroureter. Renal parenchymal enhancement normal. Stomach/Bowel: Upper normal wall thickness of the appendix. Mild wall thickening of the ascending colon. Mild sigmoid colon diverticulosis without active diverticulitis. No dilated bowel observed. Vascular/Lymphatic: Aortoiliac atherosclerotic vascular disease. Small uphill varices adjacent to the distal esophagus and stomach. Scattered small retroperitoneal lymph nodes are not pathologically enlarged by size criteria. Reproductive: Uterus absent. Small amount of gas in the vagina. Adnexa unremarkable. Other: New nodularity along the omentum with about 11 discrete nodules observed. One of the larger nodules is indistinctly marginated and measures 1.0 by 1.1 cm on image 36/2. presacral edema is present but is actually reduced compared to the prior exam.  Musculoskeletal: Lumbar spondylosis and degenerative disc disease with dextroconvex lumbar scoliosis. IMPRESSION: 1. New multifocal nodularity along the omentum. This can be inflammatory, but can also commonly be encountered in the setting of peritoneal/omental spread of malignancy, often from ovarian or gastrointestinal mucinous types. Given the patient's history of cholangiocarcinoma, early spread along the omentum is raised as a possibility. 2. The biliary tubes remain satisfactorily position. There is some minimal intrahepatic biliary dilatation. Chronic atrophy of the left hepatic lobe. 3. Mild wall thickening of the ascending colon over a long segment, suggesting mild colitis. 4. Small uphill varices adjacent to the distal esophagus, suggesting portal venous hypertension. 5. Other imaging findings of potential clinical significance: Calcified pleural plaques suggesting remote asbestos exposure. Mitral valve calcification. Scattered upper normal sized lower thoracic and upper abdominal lymph nodes. Small amount of gas in the urinary bladder, potentially introduced by the Foley catheter. Mild sigmoid colon diverticulosis. Aortic Atherosclerosis (ICD10-I70.0). Dextroconvex lumbar scoliosis. Electronically Signed   By: WVan ClinesM.D.   On: 11/01/2017 18:44     ASSESSMENT AND PLAN:  77year old female patient with history of cholangiocarcinoma, COPD, diabetes mellitus type 2, GERD currently under hospitalist service for biliary tube malfunction  -Biliary tube malfunction Interventional radiology to change the biliary tube today  -Acute severe hypokalemia Replace potassium intravenously  check magnesium level  -DVT prophylaxis subcu Lovenox daily  -COPD stable Home dose inhalers  All the records are reviewed and case discussed with Care Management/Social Worker. Management plans discussed with the patient, family and they are in agreement.  CODE STATUS: DNR  DVT Prophylaxis:  SCDs  TOTAL TIME TAKING CARE OF THIS PATIENT: 36 minutes.   POSSIBLE D/C IN 1 DAYS, DEPENDING ON CLINICAL CONDITION.  Saundra Shelling M.D on 11/02/2017 at 1:00 PM  Between 7am to 6pm - Pager - [phone]  After 6pm go to [email or web address] - password [email or web address] [facility]  Office  [phone]  CC: Primary care physician; Idelle Crouch, MD  Note: This dictation was prepared with Dragon dictation along with smaller phrase technology. Any transcriptional errors that result from this process are unintentional.

## 2017-11-02 NOTE — Telephone Encounter (Signed)
Patient got tube changed at [facility] this year. She can follow up with IR at [facility].  Patient is currently in ED.

## 2017-11-02 NOTE — Progress Notes (Signed)
MEDICATION-RELATED CONSULT NOTE   IR Procedure Consult - Anticoagulant/Antiplatelet PTA/Inpatient Med List Review by Pharmacist    Procedure: biliary catheter exchange    Completed: 1600 11/02/17  Post-Procedural bleeding risk per IR MD assessment:  low  Antithrombotic medications on inpatient or PTA profile prior to procedure:   Lovenox    Recommended restart time per IR Post-Procedure Guidelines:  + 4 hours   Other considerations:      Plan:     Resume Lovenox as scheduled at 2200.   Ulice Dash, PharmD Clinical Pharmacist

## 2017-11-02 NOTE — Progress Notes (Signed)
Advanced care plan. Purpose of the Encounter: CODE STATUS Parties in Attendance: Patient Patient's Decision Capacity: Good Subjective/Patient's story: Presented to the emergency room with nonfunctioning biliary tube  Objective/Medical story Has history of cholangiocarcinoma and biliary drain Goals of care determination:  Advance care directives and goals of care discussed with the patient in detail Patient does not want any cardiac resuscitation, intubation and ventilator if the need arises CODE STATUS: DNR Time spent discussing advanced care planning: 16 minutes

## 2017-11-02 NOTE — Procedures (Signed)
Pre procedural Diagnosis: Biliary Obstruction Post procedural Diagnosis: Same  Successful fluroscopic exchange of bilateral transhepatic10 Fr biliary drainage catheter with ends coiled and locked within the duodenum.  Both biliary drains connected to gravity bags.  EBL: None  No immediate post procedural complications.  Ronny Bacon, MD Pager #: [phone]

## 2017-11-03 LAB — BASIC METABOLIC PANEL
Anion gap: 8 (ref 5–15)
BUN: 8 mg/dL (ref 8–23)
CHLORIDE: 100 mmol/L (ref 98–111)
CO2: 30 mmol/L (ref 22–32)
CREATININE: 0.68 mg/dL (ref 0.44–1.00)
Calcium: 7.9 mg/dL — ABNORMAL LOW (ref 8.9–10.3)
GFR calc Af Amer: >60 mL/min (ref >60)
GFR calc non Af Amer: >60 mL/min (ref >60)
Glucose, Bld: 108 mg/dL — ABNORMAL HIGH (ref 70–99)
POTASSIUM: 2.3 mmol/L — AB (ref 3.5–5.1)
Sodium: 138 mmol/L (ref 135–145)

## 2017-11-03 LAB — MAGNESIUM: Magnesium: 2.3 mg/dL (ref 1.7–2.4)

## 2017-11-03 LAB — POTASSIUM
POTASSIUM: 2.8 mmol/L — AB (ref 3.5–5.1)
POTASSIUM: 4.6 mmol/L (ref 3.5–5.1)

## 2017-11-03 MED ORDER — POTASSIUM CHLORIDE CRYS ER 20 MEQ PO TBCR
40.0000 meq | EXTENDED_RELEASE_TABLET | Freq: Two times a day (BID) | ORAL | Status: DC
  Administered 2017-11-03: 40 meq via ORAL
  Filled 2017-11-03 (×2): qty 2

## 2017-11-03 MED ORDER — POTASSIUM CHLORIDE CRYS ER 20 MEQ PO TBCR
40.0000 meq | EXTENDED_RELEASE_TABLET | Freq: Once | ORAL | Status: AC
  Administered 2017-11-03: 40 meq via ORAL
  Filled 2017-11-03: qty 2

## 2017-11-03 MED ORDER — POTASSIUM CHLORIDE 20 MEQ PO PACK
80.0000 meq | PACK | Freq: Once | ORAL | Status: AC
  Administered 2017-11-03: 80 meq via ORAL
  Filled 2017-11-03: qty 4

## 2017-11-03 NOTE — Progress Notes (Signed)
Visit made. Patient seen sitting up on the side of the bed, alert and oriented, eating lunch. She voiced her understanding of the current plan to receive IV potassium prior to discharge, with possible discharge this afternoon if potassium returns to with in normal range. Labs this morning showed 2.8. She denied pain and reports feeling "better than yesterday". Patient to discharge home via [facility]. Will continue to follow and update the hospice team. Flo Shanks RN, BSN, [facility], hospital Liaison [phone]

## 2017-11-03 NOTE — Discharge Summary (Signed)
[facility] NAME: Regina [place]    MR#:  [id number]  DATE OF BIRTH:  11/20/1940  DATE OF ADMISSION:  11/01/2017 ADMITTING PHYSICIAN: Lance Coon, MD  DATE OF DISCHARGE: No discharge date for patient encounter.  PRIMARY CARE PHYSICIAN: Idelle Crouch, MD   ADMISSION DIAGNOSIS:  Hypokalemia [E87.6] Cholangiocarcinoma ([place]) [C22.1] Biliary drain displacement, initial encounter [T85.520A] Biliary obstruction due to cancer ([place]) [K83.1, C80.1]  DISCHARGE DIAGNOSIS:  Biliary drain displacement Biliary drain obstruction Acute severe hypokalemia Cholangiocarcinoma  SECONDARY DIAGNOSIS:   Past Medical History:  Diagnosis Date  . Arthritis   . Cancer ([place])   . Cellulitis   . Collagen vascular disease ([place])   . COPD (chronic obstructive pulmonary disease) ([place])   . DDD (degenerative disc disease), lumbar   . Depression   . Diabetes mellitus   . Diverticulitis   . GERD (gastroesophageal reflux disease)   . GI bleed   . Headache(784.0)   . Hypertension   . Lupus ([place])   . Ulcerative colitis [facility])      ADMITTING HISTORY Regina Hood  is a 77 y.o. female who presents with biliary tube malfunction, pt has tube in place due to cholangiocarcinoma, it started malfunctioning sometime over the past 24 hours.  She has had increased discomfort at the site due to the same.  Tube needs exchanging, hospitalist called for admission with plan for IR exchange  HOSPITAL COURSE:  Patient was admitted to medical floor.  Interventional radiology consultation was done.  Biliary drain was replaced.  Patient tolerated procedure well.  Patient's magnesium and potassium were low during the hospitalization.  Aggressive supplementation was done intravenously and her potassium at the time of discharge was 4.6 and magnesium was normal, patient hemodynamically stable will be discharged home.  CONSULTS OBTAINED:  Interventional Radiology  DRUG ALLERGIES:    Allergies  Allergen Reactions  . Penicillins Nausea Only    Has patient had a PCN reaction causing immediate rash, facial/tongue/throat swelling, SOB or lightheadedness with hypotension: No Has patient had a PCN reaction causing severe rash involving mucus membranes or skin necrosis: No Has patient had a PCN reaction that required hospitalization: No Has patient had a PCN reaction occurring within the last 10 years: No If all of the above answers are "NO", then may proceed with Cephalosporin use.    DISCHARGE MEDICATIONS:   Allergies as of 11/03/2017      Reactions   Penicillins Nausea Only   Has patient had a PCN reaction causing immediate rash, facial/tongue/throat swelling, SOB or lightheadedness with hypotension: No Has patient had a PCN reaction causing severe rash involving mucus membranes or skin necrosis: No Has patient had a PCN reaction that required hospitalization: No Has patient had a PCN reaction occurring within the last 10 years: No If all of the above answers are "NO", then may proceed with Cephalosporin use.      Medication List    STOP taking these medications   diphenoxylate-atropine 2.5-0.025 MG tablet Commonly known as:  LOMOTIL   oxyCODONE 5 MG immediate release tablet Commonly known as:  Oxy IR/ROXICODONE   pantoprazole 40 MG tablet Commonly known as:  PROTONIX     TAKE these medications   acetaminophen 500 MG tablet Commonly known as:  TYLENOL Take 1,000 mg by mouth 3 (three) times daily.   azaTHIOprine 50 MG tablet Commonly known as:  IMURAN Take 50 mg by mouth daily.   feeding supplement (ENSURE ENLIVE) Liqd  Take 237 mLs by mouth 2 (two) times daily between meals.   Fluticasone-Salmeterol 100-50 MCG/DOSE Aepb Commonly known as:  ADVAIR DISKUS Inhale 1 puff into the lungs 2 (two) times daily.   lidocaine-prilocaine cream Commonly known as:  EMLA Apply 1 application topically as needed.   magnesium chloride 64 MG Tbec SR  tablet Commonly known as:  SLOW-MAG Take 1 tablet (64 mg total) by mouth daily.   Normal Saline Flush 0.9 % Soln 1 mL by Other route every 8 (eight) hours. Flush each biliary drain with 1 mL twice daily   ondansetron 8 MG tablet Commonly known as:  ZOFRAN TAKE ONE TABLET EVERY 12 HOURS AS NEEDED What changed:  See the new instructions.   PLAQUENIL 200 MG tablet Generic drug:  hydroxychloroquine Take 200 mg by mouth 2 (two) times daily.   predniSONE 5 MG tablet Commonly known as:  DELTASONE Take 1 tablet (5 mg total) by mouth daily with breakfast.   triamcinolone cream 0.1 % Commonly known as:  KENALOG Apply 1 application topically daily.   VITAMIN B-12 PO Take 1,000 mcg by mouth daily.       Today  Patient seen today No new over night events Tolerating diet well  VITAL SIGNS:  Blood pressure 117/67, pulse 93, temperature 98 F (36.7 C), temperature source Oral, resp. rate 20, height 5' 2"  (1.575 m), weight 57.4 kg (126 lb 8.7 oz), SpO2 100 %.  I/O:    Intake/Output Summary (Last 24 hours) at 11/03/2017 1505 Last data filed at 11/03/2017 1421 Gross per 24 hour  Intake 2270.25 ml  Output 2050 ml  Net 220.25 ml    PHYSICAL EXAMINATION:  Physical Exam  GENERAL:  77 y.o.-year-old patient lying in the bed with no acute distress.  LUNGS: Normal breath sounds bilaterally, no wheezing, rales,rhonchi or crepitation. No use of accessory muscles of respiration.  CARDIOVASCULAR: S1, S2 normal. No murmurs, rubs, or gallops.  ABDOMEN: Soft, non-tender, non-distended. Bowel sounds present. No organomegaly or mass.  NEUROLOGIC: Moves all 4 extremities. PSYCHIATRIC: The patient is alert and oriented x 3.  SKIN: No obvious rash, lesion, or ulcer.   DATA REVIEW:   CBC Recent Labs  Lab 11/02/17 1153  WBC 8.1  HGB 9.7*  HCT 28.6*  PLT 282    Chemistries  Recent Labs  Lab 11/01/17 1709  11/03/17 0411  11/03/17 1427  NA 133*   < > 138  --   --   K 2.4*   < > 2.3*    < > 4.6  CL 90*   < > 100  --   --   CO2 31   < > 30  --   --   GLUCOSE 90   < > 108*  --   --   BUN 10   < > 8  --   --   CREATININE 0.98   < > 0.68  --   --   CALCIUM 7.7*   < > 7.9*  --   --   MG  --    < > 2.3  --   --   AST 184*  --   --   --   --   ALT 72*  --   --   --   --   ALKPHOS 393*  --   --   --   --   BILITOT 3.7*  --   --   --   --    < > =  values in this interval not displayed.    Cardiac Enzymes No results for input(s): TROPONINI in the last 168 hours.  Microbiology Results  Results for orders placed or performed during the hospital encounter of 08/12/17  Blood Culture (routine x 2)     Status: None   Collection Time: 08/12/17  7:04 PM  Result Value Ref Range Status   Specimen Description BLOOD RIGHT HAND  Final   Special Requests   Final    BOTTLES DRAWN AEROBIC AND ANAEROBIC Blood Culture adequate volume   Culture   Final    NO GROWTH 5 DAYS Performed at [facility], [address] [place], [place] [address]    Report Status 08/17/2017 FINAL  Final  Blood Culture (routine x 2)     Status: None   Collection Time: 08/12/17  7:05 PM  Result Value Ref Range Status   Specimen Description BLOOD RIGHT ANTECUBITAL  Final   Special Requests   Final    BOTTLES DRAWN AEROBIC AND ANAEROBIC Blood Culture results may not be optimal due to an excessive volume of blood received in culture bottles   Culture   Final    NO GROWTH 5 DAYS Performed at [facility], [address] [place], [place] [address]    Report Status 08/17/2017 FINAL  Final  MRSA PCR Screening     Status: None   Collection Time: 08/12/17 11:17 PM  Result Value Ref Range Status   MRSA by PCR NEGATIVE NEGATIVE Final    Comment:        The GeneXpert MRSA Assay (FDA approved for NASAL specimens only), is one component of a comprehensive MRSA colonization surveillance program. It is not intended to diagnose MRSA infection nor to guide or monitor treatment for MRSA  infections. Performed at [facility], [address] [place], [place] [address]   Culture, blood (single) w Reflex to ID Panel     Status: None   Collection Time: 08/14/17  8:47 PM  Result Value Ref Range Status   Specimen Description BLOOD RIGHT ANTECUBITAL  Final   Special Requests   Final    BOTTLES DRAWN AEROBIC AND ANAEROBIC Blood Culture adequate volume   Culture   Final    NO GROWTH 5 DAYS Performed at [facility], [address], [place], [place] [address]    Report Status 08/19/2017 FINAL  Final    RADIOLOGY:  Ct Abdomen Pelvis W Contrast  Result Date: 11/01/2017 CLINICAL DATA:  Pt has bilary tubes and states they need to be changed. Pt states the hospice nurse told her yesterday to have the tubes changed. Pt also has a foley cath x 3 months. Pt also reports nausea and generalized abd pain. EXAM: CT ABDOMEN AND PELVIS WITH CONTRAST TECHNIQUE: Multidetector CT imaging of the abdomen and pelvis was performed using the standard protocol following bolus administration of intravenous contrast. CONTRAST:  129m OMNIPAQUE IOHEXOL 300 MG/ML  SOLN COMPARISON:  CT abdomen 08/15/2017 FINDINGS: Lower chest: Calcified pleural plaques in the lung bases. Mitral valve calcification. Lower periaortic lymph node 0.8 cm in short axis on image 9/2, previously 0.9 cm in short axis. Scattered small pericardial lymph nodes are likewise similar to prior. Hepatobiliary: Right and left percutaneous biliary drains are present, extending through the hepatic parenchyma and into the common hepatic duct and common bile duct to coil in the duodenum. The entire left hepatic lobe is notably atrophic, similar to the prior exam. Minimally contracted gallbladder. There is only  mild intrahepatic biliary dilatation bilaterally, although slightly more than on 08/15/2017. Pancreas: Unremarkable Spleen: Unremarkable Adrenals/Urinary Tract: Foley catheter present in the decompressed urinary bladder. Small  amount of gas in the urinary bladder. No hydronephrosis or hydroureter. Renal parenchymal enhancement normal. Stomach/Bowel: Upper normal wall thickness of the appendix. Mild wall thickening of the ascending colon. Mild sigmoid colon diverticulosis without active diverticulitis. No dilated bowel observed. Vascular/Lymphatic: Aortoiliac atherosclerotic vascular disease. Small uphill varices adjacent to the distal esophagus and stomach. Scattered small retroperitoneal lymph nodes are not pathologically enlarged by size criteria. Reproductive: Uterus absent. Small amount of gas in the vagina. Adnexa unremarkable. Other: New nodularity along the omentum with about 11 discrete nodules observed. One of the larger nodules is indistinctly marginated and measures 1.0 by 1.1 cm on image 36/2. presacral edema is present but is actually reduced compared to the prior exam. Musculoskeletal: Lumbar spondylosis and degenerative disc disease with dextroconvex lumbar scoliosis. IMPRESSION: 1. New multifocal nodularity along the omentum. This can be inflammatory, but can also commonly be encountered in the setting of peritoneal/omental spread of malignancy, often from ovarian or gastrointestinal mucinous types. Given the patient's history of cholangiocarcinoma, early spread along the omentum is raised as a possibility. 2. The biliary tubes remain satisfactorily position. There is some minimal intrahepatic biliary dilatation. Chronic atrophy of the left hepatic lobe. 3. Mild wall thickening of the ascending colon over a long segment, suggesting mild colitis. 4. Small uphill varices adjacent to the distal esophagus, suggesting portal venous hypertension. 5. Other imaging findings of potential clinical significance: Calcified pleural plaques suggesting remote asbestos exposure. Mitral valve calcification. Scattered upper normal sized lower thoracic and upper abdominal lymph nodes. Small amount of gas in the urinary bladder, potentially  introduced by the Foley catheter. Mild sigmoid colon diverticulosis. Aortic Atherosclerosis (ICD10-I70.0). Dextroconvex lumbar scoliosis. Electronically Signed   By: Van Clines M.D.   On: 11/01/2017 18:44   Ir Exchange Biliary Drain  Result Date: 11/02/2017 INDICATION: History of cholangiocarcinoma with chronic right and left internal/external biliary drainage catheters. There has been minimal leakage around the left-sided percutaneous biliary drainage catheter and patient is due for routine fluoroscopic guided exchange. Biliary drainage catheters were initially placed at [facility], however subsequent drainage catheter exchanges have been performed at [facility]. Note, per patient report, she underwent attempted internalization however this ultimately unsuccessful and as such both percutaneous biliary drainage catheters have been chronically maintained to external drainage bags. EXAM: FLUOROSCOPIC GUIDED BILATERAL PERCUTANEOUS BILIARY DRAINAGE CATHETERS COMPARISON:  COMPARISON Fluoroscopic guided bilateral percutaneous biliary drainage catheter exchange-08/17/2017; CT abdomen and pelvis - 11/01/2017 CONTRAST:  20 cc Isovue-300 administered into the biliary system and proximal small bowel. FLUOROSCOPY TIME:  3 minutes 36 seconds (49 mGy) COMPLICATIONS: None immediate. TECHNIQUE: Informed written consent was obtained from the patient after a discussion of the risks, benefits and alternatives to treatment. Questions regarding the procedure were encouraged and answered. A timeout was performed prior to the initiation of the procedure. The external portion of the existing bilateral biliary drainage catheters as well as the surrounding skin were prepped and draped in the usual sterile fashion. A sterile drape was applied covering the operative field. Maximum barrier sterile technique with sterile gowns and gloves were used for the procedure. A timeout was performed  prior to the initiation of the procedure. A pre procedural spot fluoroscopic image was obtained after contrast was injected via the existing biliary drainage catheters. Attention was first paid towards exchange of the right hepatic  approach biliary drainage catheter. The existing biliary drainage catheter was cut and cannulated with a stiff Glidewire wire which was coiled within the proximal small bowel. Under intermittent fluoroscopic guidance, the existing PBD was exchanged for a new 10 [place] PBD. Small amount of contrast was injected via the exchanged biliary drainage catheter and a post exchange spot fluoroscopic image was obtained. The catheter was locked and secured to the skin with a single interrupted suture. The identical procedure was then repeated for the left hepatic biliary drainage catheter with new 10 French percutaneous drainage catheter ultimately coiled and locked within the duodenum. Note, the left-sided percutaneous biliary drainage catheter does not have a radiopaque marker. A dressing was placed. The patient tolerated the procedure well without immediate postprocedural complication. FINDINGS: After successful fluoroscopic guided exchange, the new bilateral 10 French percutaneous biliary catheters are appropriately positioned with end coiled and locked within the proximal duodenum. Note, the left-sided percutaneous biliary drainage catheter does not have a radiopaque marker. IMPRESSION: Successful fluoroscopic guided exchange of bilateral 10 French percutaneous biliary drainage catheters. Electronically Signed   By: Sandi Mariscal M.D.   On: 11/02/2017 16:23   Ir Exchange Biliary Drain  Result Date: 11/02/2017 INDICATION: History of cholangiocarcinoma with chronic right and left internal/external biliary drainage catheters. There has been minimal leakage around the left-sided percutaneous biliary drainage catheter and patient is due for routine fluoroscopic guided exchange. Biliary drainage  catheters were initially placed at [facility], however subsequent drainage catheter exchanges have been performed at [facility]. Note, per patient report, she underwent attempted internalization however this ultimately unsuccessful and as such both percutaneous biliary drainage catheters have been chronically maintained to external drainage bags. EXAM: FLUOROSCOPIC GUIDED BILATERAL PERCUTANEOUS BILIARY DRAINAGE CATHETERS COMPARISON:  COMPARISON Fluoroscopic guided bilateral percutaneous biliary drainage catheter exchange-08/17/2017; CT abdomen and pelvis - 11/01/2017 CONTRAST:  20 cc Isovue-300 administered into the biliary system and proximal small bowel. FLUOROSCOPY TIME:  3 minutes 36 seconds (49 mGy) COMPLICATIONS: None immediate. TECHNIQUE: Informed written consent was obtained from the patient after a discussion of the risks, benefits and alternatives to treatment. Questions regarding the procedure were encouraged and answered. A timeout was performed prior to the initiation of the procedure. The external portion of the existing bilateral biliary drainage catheters as well as the surrounding skin were prepped and draped in the usual sterile fashion. A sterile drape was applied covering the operative field. Maximum barrier sterile technique with sterile gowns and gloves were used for the procedure. A timeout was performed prior to the initiation of the procedure. A pre procedural spot fluoroscopic image was obtained after contrast was injected via the existing biliary drainage catheters. Attention was first paid towards exchange of the right hepatic approach biliary drainage catheter. The existing biliary drainage catheter was cut and cannulated with a stiff Glidewire wire which was coiled within the proximal small bowel. Under intermittent fluoroscopic guidance, the existing PBD was exchanged for a new 10 [place] PBD. Small amount of contrast was injected via the  exchanged biliary drainage catheter and a post exchange spot fluoroscopic image was obtained. The catheter was locked and secured to the skin with a single interrupted suture. The identical procedure was then repeated for the left hepatic biliary drainage catheter with new 10 French percutaneous drainage catheter ultimately coiled and locked within the duodenum. Note, the left-sided percutaneous biliary drainage catheter does not have a radiopaque marker. A dressing was placed. The patient tolerated the procedure well without immediate postprocedural  complication. FINDINGS: After successful fluoroscopic guided exchange, the new bilateral 10 French percutaneous biliary catheters are appropriately positioned with end coiled and locked within the proximal duodenum. Note, the left-sided percutaneous biliary drainage catheter does not have a radiopaque marker. IMPRESSION: Successful fluoroscopic guided exchange of bilateral 10 French percutaneous biliary drainage catheters. Electronically Signed   By: Sandi Mariscal M.D.   On: 11/02/2017 16:23    Follow up with PCP in 1 week.  Management plans discussed with the patient, family and they are in agreement.  CODE STATUS: DNR    Code Status Orders  (From admission, onward)        Start     Ordered   11/01/17 2209  Do not attempt resuscitation (DNR)  Continuous    Question Answer Comment  In the event of cardiac or respiratory ARREST Do not call a "code blue"   In the event of cardiac or respiratory ARREST Do not perform Intubation, CPR, defibrillation or ACLS   In the event of cardiac or respiratory ARREST Use medication by any route, position, wound care, and other measures to relive pain and suffering. May use oxygen, suction and manual treatment of airway obstruction as needed for comfort.      11/01/17 2208    Code Status History    Date Active Date Inactive Code Status Order ID Comments User Context   08/12/2017 2256 08/18/2017 1902 DNR [id number]   Amelia Jo, MD Inpatient   08/07/2017 0233 08/10/2017 2140 DNR [id number]  Lance Coon, MD ED   06/07/2017 1308 06/09/2017 1715 DNR [id number]  Max Sane, MD Inpatient   06/07/2017 1200 06/07/2017 1308 Full Code [id number]  Max Sane, MD Inpatient   05/04/2017 1612 05/05/2017 2249 DNR [id number]  Bettey Costa, MD Inpatient   05/04/2017 1546 05/04/2017 1612 Full Code [id number]  Bettey Costa, MD Inpatient   04/27/2017 1826 04/30/2017 2200 DNR [id number]  Vaughan Basta, MD Inpatient   03/16/2017 1712 03/17/2017 1608 DNR [id number]  Henreitta Leber, MD Inpatient   11/13/2016 0301 11/14/2016 1412 DNR [id number]  Harrie Foreman, MD Inpatient   06/29/2016 1533 07/01/2016 1922 DNR [id number]  Hillary Bow, MD ED   09/17/2011 1443 09/19/2011 1624 Full Code [id number]  Corum, Jeani Sow, RN Inpatient    Advance Directive Documentation     Most Recent Value  Type of Advance Directive  Healthcare Power of Attorney  Pre-existing out of facility DNR order (yellow form or pink MOST form)  -  "MOST" Form in Place?  -      TOTAL TIME TAKING CARE OF THIS PATIENT ON DAY OF DISCHARGE: more than 35 minutes.   Saundra Shelling M.D on 11/03/2017 at 3:05 PM  Between 7am to 6pm - Pager - [phone]  After 6pm go to [email or web address] - password [email or web address] [email or web address]  Office  [phone]  CC: Primary care physician; Idelle Crouch, MD  Note: This dictation was prepared with Dragon dictation along with smaller phrase technology. Any transcriptional errors that result from this process are unintentional.

## 2017-11-11 ENCOUNTER — Other Ambulatory Visit: Payer: Self-pay | Admitting: *Deleted

## 2017-11-11 MED ORDER — OXYCODONE HCL 5 MG PO TABS: 5.0000 mg | ORAL_TABLET | Freq: Four times a day (QID) | ORAL | 0 refills | Status: DC | PRN

## 2017-11-11 NOTE — Telephone Encounter (Signed)
Hospice called requesting refill of her Oxycodone. Per discharge summary, she was to stop Oxycodone, Lomotil, and Protonix 11/03/17. Dr Leroy Libman is not available to see why he felt the need to discontinue these medications. I spoke with the 1C nurse and she states most of the time it is the decision of the PCP or oncologist to restart these medications if necessary.

## 2017-11-22 ENCOUNTER — Other Ambulatory Visit: Payer: Self-pay | Admitting: Oncology

## 2017-11-22 ENCOUNTER — Telehealth: Payer: Self-pay | Admitting: *Deleted

## 2017-11-22 DIAGNOSIS — C221 Intrahepatic bile duct carcinoma: Secondary | ICD-10-CM

## 2017-11-22 MED ORDER — OXYCODONE HCL 5 MG PO TABS: 5.0000 mg | ORAL_TABLET | ORAL | 0 refills | Status: AC | PRN

## 2017-11-22 MED ORDER — FENTANYL 25 MCG/HR TD PT72: 25.0000 ug | MEDICATED_PATCH | TRANSDERMAL | 0 refills | Status: DC

## 2017-11-22 MED ORDER — OXYCODONE HCL ER 15 MG PO T12A: 15.0000 mg | EXTENDED_RELEASE_TABLET | Freq: Two times a day (BID) | ORAL | 0 refills | Status: DC

## 2017-11-22 NOTE — Telephone Encounter (Signed)
Regina [place] informed of new prescription and patient will think about Paracentesis

## 2017-11-22 NOTE — Telephone Encounter (Signed)
Please order MS Contin instead, Hospice does NOT cover Oxycontin

## 2017-11-22 NOTE — Telephone Encounter (Signed)
Hospice nurse called requesting patient be started on a long a long pain medicine Increasing pain at bili tubes and increasing abdominal girth with ascites. Stools are now clay colored and soft. Using Oxy every 6 hours and at times does not help. She is also having a lot of nausea. New symptoms of fluid in her lung bases bilateral. Please advise.   Hospice does not cover Oxycontin

## 2017-11-22 NOTE — Telephone Encounter (Signed)
Ordered Oxycontin 46m Q12, changed oxycodone 552mto Q4h PRN. RX sent to Pharmacy.  Please ask if she is interested in [place] paracentesis which may help relieve some discomfort. If she is interested I will arrange.

## 2017-11-22 NOTE — Telephone Encounter (Signed)
Regina [place] will check with patient to see if she wants Paracentesis done. She will also inform her that Oxycodone can be taken every 4 hours as needed

## 2017-11-23 NOTE — Telephone Encounter (Signed)
I have ordered already. Please schedule thanks.

## 2017-11-30 NOTE — Telephone Encounter (Signed)
Spoke with patient on 8/12 to see if she was ready for me to schedule the paracentesis, patient declined and agrees to call our office if/when she decided to proceed.

## 2017-12-02 ENCOUNTER — Other Ambulatory Visit: Payer: Self-pay | Admitting: *Deleted

## 2017-12-02 ENCOUNTER — Telehealth: Payer: Self-pay | Admitting: *Deleted

## 2017-12-02 DIAGNOSIS — C221 Intrahepatic bile duct carcinoma: Secondary | ICD-10-CM

## 2017-12-02 NOTE — Telephone Encounter (Signed)
Hospice nurse called and reports that the sutures holing the bili drain in place have come loose and the patient needs to have them replaced before the tube is dislodged. Also reports that patient is complaining of dizziness and weakness and diarrhea. She is using Lomotil and they started her on slow mag from comfort kit too. Please arrange for tube to be fixed and let her know what to do about dizziness and diarrhea. Call Mitzi [phone], Mrs Rachal's phone is out

## 2017-12-05 ENCOUNTER — Ambulatory Visit
Admission: RE | Admit: 2017-12-05 | Discharge: 2017-12-05 | Disposition: A | Discharge status: Home / Self Care | Payer: PPO | Source: Ambulatory Visit | Attending: Oncology | Admitting: Oncology

## 2017-12-05 ENCOUNTER — Encounter: Payer: Self-pay | Admitting: Interventional Radiology

## 2017-12-05 ENCOUNTER — Other Ambulatory Visit (HOSPITAL_COMMUNITY): Payer: Self-pay | Admitting: Interventional Radiology

## 2017-12-05 ENCOUNTER — Other Ambulatory Visit: Payer: Self-pay | Admitting: Oncology

## 2017-12-05 DIAGNOSIS — Z4682 Encounter for fitting and adjustment of non-vascular catheter: Secondary | ICD-10-CM | POA: Insufficient documentation

## 2017-12-05 DIAGNOSIS — C221 Intrahepatic bile duct carcinoma: Secondary | ICD-10-CM

## 2017-12-05 DIAGNOSIS — T85590A Other mechanical complication of bile duct prosthesis, initial encounter: Secondary | ICD-10-CM | POA: Diagnosis not present

## 2017-12-05 DIAGNOSIS — T85638A Leakage of other specified internal prosthetic devices, implants and grafts, initial encounter: Secondary | ICD-10-CM | POA: Diagnosis not present

## 2017-12-05 HISTORY — PX: IR EXCHANGE BILIARY DRAIN: IMG6046

## 2017-12-05 MED ORDER — LIDOCAINE HCL (PF) 1 % IJ SOLN
5.0000 mL | Freq: Once | INTRAMUSCULAR | Status: DC
  Filled 2017-12-05: qty 5

## 2017-12-05 MED ORDER — IOPAMIDOL (ISOVUE-300) INJECTION 61%
30.0000 mL | Freq: Once | INTRAVENOUS | Status: AC | PRN
  Administered 2017-12-05: 30 mL

## 2017-12-05 MED ORDER — LIDOCAINE HCL (PF) 1 % IJ SOLN
INTRAMUSCULAR | Status: AC
  Filled 2017-12-05: qty 30

## 2017-12-06 ENCOUNTER — Other Ambulatory Visit: Payer: Self-pay | Admitting: *Deleted

## 2017-12-07 MED ORDER — FENTANYL 25 MCG/HR TD PT72: 25.0000 ug | MEDICATED_PATCH | TRANSDERMAL | 0 refills | Status: DC

## 2017-12-15 ENCOUNTER — Other Ambulatory Visit: Payer: Self-pay | Admitting: Oncology

## 2017-12-27 ENCOUNTER — Other Ambulatory Visit: Payer: Self-pay | Admitting: *Deleted

## 2017-12-27 MED ORDER — FENTANYL 25 MCG/HR TD PT72: 25.0000 ug | MEDICATED_PATCH | TRANSDERMAL | 0 refills | Status: DC

## 2018-01-06 ENCOUNTER — Telehealth: Payer: Self-pay | Admitting: *Deleted

## 2018-01-06 ENCOUNTER — Ambulatory Visit
Admission: RE | Admit: 2018-01-06 | Discharge: 2018-01-06 | Disposition: A | Discharge status: Home / Self Care | Source: Ambulatory Visit | Attending: Interventional Radiology | Admitting: Interventional Radiology

## 2018-01-06 ENCOUNTER — Encounter: Payer: Self-pay | Admitting: Interventional Radiology

## 2018-01-06 DIAGNOSIS — Z4803 Encounter for change or removal of drains: Secondary | ICD-10-CM | POA: Diagnosis not present

## 2018-01-06 DIAGNOSIS — C221 Intrahepatic bile duct carcinoma: Secondary | ICD-10-CM | POA: Insufficient documentation

## 2018-01-06 HISTORY — PX: IR EXCHANGE BILIARY DRAIN: IMG6046

## 2018-01-06 MED ORDER — FENTANYL CITRATE (PF) 100 MCG/2ML IJ SOLN
INTRAMUSCULAR | Status: AC | PRN
  Administered 2018-01-06: 25 ug via INTRAVENOUS

## 2018-01-06 MED ORDER — SODIUM CHLORIDE 0.9% FLUSH: 5.0000 mL | Freq: Three times a day (TID) | INTRAVENOUS | Status: DC

## 2018-01-06 MED ORDER — MIDAZOLAM HCL 2 MG/2ML IJ SOLN
INTRAMUSCULAR | Status: AC | PRN
  Administered 2018-01-06 (×2): 1 mg via INTRAVENOUS

## 2018-01-06 MED ORDER — MIDAZOLAM HCL 2 MG/2ML IJ SOLN
INTRAMUSCULAR | Status: AC
  Filled 2018-01-06: qty 2

## 2018-01-06 MED ORDER — SODIUM CHLORIDE 0.9 % IV SOLN
Freq: Once | INTRAVENOUS | Status: AC
  Administered 2018-01-06: 1000 mL via INTRAVENOUS

## 2018-01-06 MED ORDER — IOPAMIDOL (ISOVUE-300) INJECTION 61%
30.0000 mL | Freq: Once | INTRAVENOUS | Status: AC | PRN
  Administered 2018-01-06: 10 mL via INTRAVENOUS

## 2018-01-06 MED ORDER — FENTANYL CITRATE (PF) 100 MCG/2ML IJ SOLN
INTRAMUSCULAR | Status: AC
  Filled 2018-01-06: qty 2

## 2018-01-06 NOTE — Discharge Instructions (Signed)
Biliary Drainage Catheter Placement, Care After This sheet gives you information about how to care for yourself after your procedure. Your health care provider may also give you more specific instructions. If you have problems or questions, contact your health care provider. What can I expect after the procedure? After the procedure, it is common to have:  Pain or soreness at the catheter insertion site.  Tiredness and sleepiness for several hours.  Some bruising at the catheter insertion site.  Drainage into the collection bag on the outside of your body, if you have an external drainage catheter. ? You might see bloody discharge in the bag for the first 1 or 2 days. ? Then, the discharge should turn a yellow-green color.  Follow these instructions at home: Medicines  Take over-the-counter and prescription medicines for pain, discomfort, or fever only as told by your health care provider.  Do not take aspirin or blood thinners unless your health care provider says that you can. These can make bleeding worse.  Do not drive or use heavy machinery while taking prescription pain medicine. Catheter insertion site care  Clean the catheter insertion site as told by your health care provider.  Do not take baths, swim, or use a hot tub until your health care provider approves.  Take showers only. Before showering, cover the catheter insertion area with a watertight covering to keep the area dry.  Keep the skin around the catheter insertion site dry. If the area gets wet, dry the skin completely.  Check your catheter insertion site every day for signs of infection. Check for: ? Redness, swelling, or pain. ? Fluid or blood. ? Warmth. ? Pus or a bad smell. General instructions  Rest for the remainder of the day.  Do not drive, use machinery, or make legal decisions for 24 hours after your procedure.  Resume your usual diet. Avoid alcoholic beverages for 24 hours after your  procedure.  Keep all follow-up visits as told by your health care provider. This is important.  Drink enough fluid to keep your urine clear or pale yellow. Contact a health care provider if:  Your pain gets worse after it had improved, and it is not relieved with pain medicines.  You have any questions about caring for your drainage catheter or collection bag.  You have any of these around your catheter insertion site or coming from it: ? Skin breakdown. ? Redness, swelling, or pain. ? Fluid or blood. ? Warmth to the touch. ? Pus or a bad smell. Get help right away if:  You have a fever or chills.  Your redness, swelling, or pain at the catheter insertion site gets worse, even though you are cleaning it well.  You have leakage of bile around the drainage catheter.  Your drainage catheter becomes blocked or clogged.  Your drainage catheter comes out. This information is not intended to replace advice given to you by your health care provider. Make sure you discuss any questions you have with your health care provider. Document Released: 11/18/2003 Document Revised: 02/23/2016 Document Reviewed: 02/23/2016 Elsevier Interactive Patient Education  2017 [employer].

## 2018-01-06 NOTE — Telephone Encounter (Signed)
Contacted hospice and requested C-Diff lab. Hospice nurse is also going to try Equalactin per pharmacy advice.

## 2018-01-06 NOTE — Procedures (Signed)
Biliary obstruction  S/p bilateral biliary drain exchgs  No comp Stable Full report in pacs

## 2018-01-06 NOTE — Telephone Encounter (Signed)
Check if C diff is done. If not, please send.

## 2018-01-06 NOTE — Telephone Encounter (Signed)
Mitzi from [facility] called to request advice regarding patients Diarrhea. She has a multiple episodes for the past 24 hours. She has not responded to Lomotil or Imodium.

## 2018-01-09 ENCOUNTER — Other Ambulatory Visit: Payer: Self-pay | Admitting: Physician Assistant

## 2018-01-09 ENCOUNTER — Other Ambulatory Visit: Payer: Self-pay | Admitting: Oncology

## 2018-01-09 ENCOUNTER — Telehealth: Payer: Self-pay | Admitting: *Deleted

## 2018-01-09 DIAGNOSIS — C221 Intrahepatic bile duct carcinoma: Secondary | ICD-10-CM

## 2018-01-09 NOTE — Telephone Encounter (Signed)
Per Hassan Rowan, she advised Mitzi, Mitzi states that Darreld Mclean was not started

## 2018-01-09 NOTE — Telephone Encounter (Signed)
Do not recommend. Please stop [employer]

## 2018-01-09 NOTE — Telephone Encounter (Signed)
I spoke with Dr Annamaria Boots and to [facility] in specials and the patient is to be at hospital tomorrow for drain replacement. Mitzi informed and will notify patient to be here at 12:30 for 1:00 procedure.. Mitzi states the stool sample was taken to lab Friday evening and she has not heard back at this time and I gave her the orders from Dr Tasia Catchings that if C Diff negative to start Atropine 0.5 mg IV or SQ every 6 hours as needed diarrhea.

## 2018-01-09 NOTE — Telephone Encounter (Signed)
Patient had biliary drains replaced Friday and the one at the 12 o'clock position is out. That site and the site at 9 o'clock position are draining copious amounts of drainage. Also noted is that her lower abdomen from navel to groin are blue. Patient also reports that she is having 6-7 mushy to liquid stools per day and Lomotil and Imodium are not controlling it. Please advise

## 2018-01-09 NOTE — Telephone Encounter (Signed)
Should have IR to take a look at the drains.  I ordered IR exchange biliary stent.   Diarrhea, if c diff is negative, please start patient on Atropine 0.67m IV or subcutaneous Q6 hours as needed for diarrhea.

## 2018-01-10 ENCOUNTER — Ambulatory Visit
Admission: RE | Admit: 2018-01-10 | Discharge: 2018-01-10 | Disposition: A | Discharge status: Home / Self Care | Source: Ambulatory Visit | Attending: Oncology | Admitting: Oncology

## 2018-01-10 ENCOUNTER — Encounter: Payer: Self-pay | Admitting: Interventional Radiology

## 2018-01-10 DIAGNOSIS — Z79899 Other long term (current) drug therapy: Secondary | ICD-10-CM | POA: Insufficient documentation

## 2018-01-10 DIAGNOSIS — Z836 Family history of other diseases of the respiratory system: Secondary | ICD-10-CM | POA: Diagnosis not present

## 2018-01-10 DIAGNOSIS — M1990 Unspecified osteoarthritis, unspecified site: Secondary | ICD-10-CM | POA: Insufficient documentation

## 2018-01-10 DIAGNOSIS — E119 Type 2 diabetes mellitus without complications: Secondary | ICD-10-CM | POA: Diagnosis not present

## 2018-01-10 DIAGNOSIS — Z9889 Other specified postprocedural states: Secondary | ICD-10-CM | POA: Insufficient documentation

## 2018-01-10 DIAGNOSIS — K219 Gastro-esophageal reflux disease without esophagitis: Secondary | ICD-10-CM | POA: Insufficient documentation

## 2018-01-10 DIAGNOSIS — I10 Essential (primary) hypertension: Secondary | ICD-10-CM | POA: Insufficient documentation

## 2018-01-10 DIAGNOSIS — Y831 Surgical operation with implant of artificial internal device as the cause of abnormal reaction of the patient, or of later complication, without mention of misadventure at the time of the procedure: Secondary | ICD-10-CM | POA: Insufficient documentation

## 2018-01-10 DIAGNOSIS — T85638A Leakage of other specified internal prosthetic devices, implants and grafts, initial encounter: Secondary | ICD-10-CM | POA: Diagnosis not present

## 2018-01-10 DIAGNOSIS — Z8042 Family history of malignant neoplasm of prostate: Secondary | ICD-10-CM | POA: Diagnosis not present

## 2018-01-10 DIAGNOSIS — C221 Intrahepatic bile duct carcinoma: Secondary | ICD-10-CM | POA: Insufficient documentation

## 2018-01-10 DIAGNOSIS — Z7951 Long term (current) use of inhaled steroids: Secondary | ICD-10-CM | POA: Diagnosis not present

## 2018-01-10 DIAGNOSIS — Z90710 Acquired absence of both cervix and uterus: Secondary | ICD-10-CM | POA: Insufficient documentation

## 2018-01-10 DIAGNOSIS — J449 Chronic obstructive pulmonary disease, unspecified: Secondary | ICD-10-CM | POA: Diagnosis not present

## 2018-01-10 DIAGNOSIS — M329 Systemic lupus erythematosus, unspecified: Secondary | ICD-10-CM | POA: Insufficient documentation

## 2018-01-10 DIAGNOSIS — T85590A Other mechanical complication of bile duct prosthesis, initial encounter: Secondary | ICD-10-CM | POA: Diagnosis not present

## 2018-01-10 DIAGNOSIS — Z880 Allergy status to penicillin: Secondary | ICD-10-CM | POA: Insufficient documentation

## 2018-01-10 DIAGNOSIS — Z87891 Personal history of nicotine dependence: Secondary | ICD-10-CM | POA: Insufficient documentation

## 2018-01-10 DIAGNOSIS — T85520A Displacement of bile duct prosthesis, initial encounter: Secondary | ICD-10-CM | POA: Diagnosis not present

## 2018-01-10 HISTORY — PX: IR EXCHANGE BILIARY DRAIN: IMG6046

## 2018-01-10 MED ORDER — SODIUM CHLORIDE 0.9 % IV SOLN
INTRAVENOUS | Status: DC
  Administered 2018-01-10: 14:00:00 via INTRAVENOUS

## 2018-01-10 MED ORDER — MIDAZOLAM HCL 2 MG/2ML IJ SOLN
INTRAMUSCULAR | Status: AC
  Filled 2018-01-10: qty 2

## 2018-01-10 MED ORDER — FENTANYL CITRATE (PF) 100 MCG/2ML IJ SOLN
INTRAMUSCULAR | Status: AC | PRN
  Administered 2018-01-10 (×2): 50 ug via INTRAVENOUS

## 2018-01-10 MED ORDER — MIDAZOLAM HCL 2 MG/2ML IJ SOLN
INTRAMUSCULAR | Status: AC | PRN
  Administered 2018-01-10 (×2): 1 mg via INTRAVENOUS

## 2018-01-10 MED ORDER — IOPAMIDOL (ISOVUE-300) INJECTION 61%: 30.0000 mL | Freq: Once | INTRAVENOUS | Status: DC | PRN

## 2018-01-10 MED ORDER — LIDOCAINE HCL (PF) 1 % IJ SOLN
INTRAMUSCULAR | Status: AC | PRN
  Administered 2018-01-10: 4 mL

## 2018-01-10 MED ORDER — FENTANYL CITRATE (PF) 100 MCG/2ML IJ SOLN
INTRAMUSCULAR | Status: AC
  Filled 2018-01-10: qty 2

## 2018-01-10 NOTE — Procedures (Signed)
R 10 Fr biliary drain exchange L 10 Fr biliary drain replacement EBL 0 Comp 0

## 2018-01-10 NOTE — H&P (Signed)
Chief Complaint: Patient was seen in consultation today for No chief complaint on file.  at the request of Yu,Zhou  Referring Physician(s): Yu,Zhou  Supervising Physician: Marybelle Killings  Patient Status: [facility] - Out-pt  History of Present Illness: Regina Hood is a 77 y.o. female with cholangiocarcinoma nad bilateral biliary drain access. The left drain was inadvertently pulled out recently. The right drain is leaking.  Past Medical History:  Diagnosis Date  . Arthritis   . Cancer ([place])   . Cellulitis   . Collagen vascular disease ([place])   . COPD (chronic obstructive pulmonary disease) ([place])   . DDD (degenerative disc disease), lumbar   . Depression   . Diabetes mellitus   . Diverticulitis   . GERD (gastroesophageal reflux disease)   . GI bleed   . Headache(784.0)   . Hypertension   . Lupus ([place])   . Ulcerative colitis [facility])     Past Surgical History:  Procedure Laterality Date  . ABDOMINAL HYSTERECTOMY    . BREAST BIOPSY Right   . COLONOSCOPY    . COLONOSCOPY WITH PROPOFOL N/A 08/18/2016   Procedure: COLONOSCOPY WITH PROPOFOL;  Surgeon: Manya Silvas, MD;  Location: [facility] ENDOSCOPY;  Service: Endoscopy;  Laterality: N/A;  . ESOPHAGOGASTRODUODENOSCOPY (EGD) WITH PROPOFOL N/A 08/18/2016   Procedure: ESOPHAGOGASTRODUODENOSCOPY (EGD) WITH PROPOFOL;  Surgeon: Manya Silvas, MD;  Location: [facility] ENDOSCOPY;  Service: Endoscopy;  Laterality: N/A;  . IR BILIARY DRAIN PLACEMENT WITH CHOLANGIOGRAM  06/08/2017  . IR EXCHANGE BILIARY DRAIN  08/17/2017  . IR EXCHANGE BILIARY DRAIN  11/02/2017  . IR EXCHANGE BILIARY DRAIN  11/02/2017  . IR EXCHANGE BILIARY DRAIN  12/05/2017  . IR EXCHANGE BILIARY DRAIN  12/05/2017  . IR EXCHANGE BILIARY DRAIN  01/06/2018  . IR EXCHANGE BILIARY DRAIN  01/06/2018  . ORIF HUMERUS FRACTURE  09/17/2011   Procedure: OPEN REDUCTION INTERNAL FIXATION (ORIF) PROXIMAL HUMERUS FRACTURE;  Surgeon: Augustin Schooling, MD;  Location: [facility];  Service: Orthopedics;   Laterality: Left;    Allergies: Penicillins  Medications: Prior to Admission medications   Medication Sig Start Date End Date Taking? Authorizing Provider  acetaminophen (TYLENOL) 500 MG tablet Take 1,000 mg by mouth 3 (three) times daily.    [provider]  azaTHIOprine (IMURAN) 50 MG tablet Take 50 mg by mouth daily.    [provider]  Cyanocobalamin (VITAMIN B-12 PO) Take 1,000 mcg by mouth daily.    [provider]  feeding supplement, ENSURE ENLIVE, (ENSURE ENLIVE) LIQD Take 237 mLs by mouth 2 (two) times daily between meals. 08/18/17   Vaughan Basta, MD  fentaNYL (DURAGESIC - DOSED MCG/HR) 25 MCG/HR patch Place 1 patch (25 mcg total) onto the skin every 3 (three) days. 12/27/17   Earlie Server, MD  Fluticasone-Salmeterol (ADVAIR DISKUS) 100-50 MCG/DOSE AEPB Inhale 1 puff into the lungs 2 (two) times daily. 07/18/17   Flora Lipps, MD  hydroxychloroquine (PLAQUENIL) 200 MG tablet Take 200 mg by mouth 2 (two) times daily.     [provider]  lidocaine-prilocaine (EMLA) cream Apply 1 application topically as needed. 02/16/17   Cammie Sickle, MD  magnesium chloride (SLOW-MAG) 64 MG TBEC SR tablet Take 1 tablet (64 mg total) by mouth daily. 07/13/17   Jacquelin Hawking, NP  ondansetron (ZOFRAN) 8 MG tablet TAKE ONE TABLET EVERY 12 HOURS AS NEEDED Patient taking differently: Take 1 tablet [facility]) by mouth every 12 hours as needed for nausea and/or vomiting 04/06/17  Earlie Server, MD  oxyCODONE (OXY IR/ROXICODONE) 5 MG immediate release tablet Take 1 tablet (5 mg total) by mouth every 4 (four) hours as needed for severe pain. Hospice patient. 11/22/17   Earlie Server, MD  predniSONE (DELTASONE) 5 MG tablet TAKE 1 TABLET BY MOUTH EVERY MORNING WITH BREAKFAST 12/15/17   Earlie Server, MD  Sodium Chloride Flush (NORMAL SALINE FLUSH) 0.9 % SOLN 1 mL by Other route every 8 (eight) hours. Flush each biliary drain with 1 mL twice daily 06/24/17   Earlie Server, MD  triamcinolone  cream (KENALOG) 0.1 % Apply 1 application topically daily.  07/20/17   [provider]     Family History  Problem Relation Age of Onset  . Breast cancer Mother 54  . COPD Father   . Prostate cancer Brother     Social History   Socioeconomic History  . Marital status: Widowed    Spouse name: Not on file  . Number of children: 2  . Years of education: Not on file  . Highest education level: Not on file  Occupational History  . Not on file  Social Needs  . Financial resource strain: Not hard at all  . Food insecurity:    Worry: Never true    Inability: Never true  . Transportation needs:    Medical: No    Non-medical: No  Tobacco Use  . Smoking status: Former Smoker    Packs/day: 1.00    Years: 10.00    Pack years: 10.00    Types: Cigarettes    Last attempt to quit: 12/30/1998    Years since quitting: 19.0  . Smokeless tobacco: Never Used  . Tobacco comment: quit smoking in 2003  Substance and Sexual Activity  . Alcohol use: No  . Drug use: No  . Sexual activity: Not Currently  Lifestyle  . Physical activity:    Days per week: 0 days    Minutes per session: 0 min  . Stress: Only a little  Relationships  . Social connections:    Talks on phone: Once a week    Gets together: Once a week    Attends religious service: More than 4 times per year    Active member of club or organization: Yes    Attends meetings of clubs or organizations: 1 to 4 times per year    Relationship status: Widowed  Other Topics Concern  . Not on file  Social History Narrative   Live alone, independent     Review of Systems: A 12 point ROS discussed and pertinent positives are indicated in the HPI above.  All other systems are negative.  Review of Systems  Vital Signs: BP 130/73   Pulse 100   Temp 99.8 F (37.7 C) (Oral)   Resp 16   Ht 5' 2"  (1.575 m)   Wt 54 kg   SpO2 93%   BMI 21.77 kg/m   Physical Exam  Constitutional: She is oriented to person, place, and time.   HENT:  Head: Normocephalic and atraumatic.  Cardiovascular: Normal rate and regular rhythm.  Pulmonary/Chest: Effort normal and breath sounds normal.  Neurological: She is alert and oriented to person, place, and time.      Imaging: Ir Exchange Biliary Drain  Result Date: 01/06/2018 INDICATION: Cholangiocarcinoma, chronic indwelling bilateral internal external biliary drain catheters. Routine exchange. EXAM: Fluoroscopic exchange of the bilateral internal external biliary drains MEDICATIONS: 1% lidocaine local ANESTHESIA/SEDATION: Moderate (conscious) sedation was employed during this procedure. A total of  Versed 2.0 mg and Fentanyl 25 mcg was administered intravenously. Moderate Sedation Time: 13 minutes. The patient's level of consciousness and vital signs were monitored continuously by radiology nursing throughout the procedure under my direct supervision. FLUOROSCOPY TIME:  Fluoroscopy Time: 1 minutes 48 seconds (21 mGy). COMPLICATIONS: None immediate. PROCEDURE: Informed written consent was obtained from the patient after a thorough discussion of the procedural risks, benefits and alternatives. All questions were addressed. Maximal Sterile Barrier Technique was utilized including caps, mask, sterile gowns, sterile gloves, sterile drape, hand hygiene and skin antiseptic. A timeout was performed prior to the initiation of the procedure. Under sterile conditions and local anesthesia, the existing biliary drain catheters were injected to confirm position. The right biliary drain has retracted into the common bile duct. Right biliary drain catheter was cut and removed. Catheter and guidewire were access were manipulated back to the duodenum. Over the Bentson guidewire, the right biliary drain catheter was successfully exchanged. Retention loop formed in the duodenum. Contrast injection confirms position. In a similar fashion the left biliary drain was injected to confirm position. This catheter was cut  and removed over a Bentson guidewire. New 10 French internal external biliary drain advanced with the retention loop formed in the duodenum. Catheters secured externally. No immediate complication. Patient tolerated the procedure well. IMPRESSION: Successful bilateral fluoroscopic exchange of the internal external biliary drains. Electronically Signed   By: Jerilynn Mages.  Shick M.D.   On: 01/06/2018 14:28   Ir Exchange Biliary Drain  Result Date: 01/06/2018 INDICATION: Cholangiocarcinoma, chronic indwelling bilateral internal external biliary drain catheters. Routine exchange. EXAM: Fluoroscopic exchange of the bilateral internal external biliary drains MEDICATIONS: 1% lidocaine local ANESTHESIA/SEDATION: Moderate (conscious) sedation was employed during this procedure. A total of Versed 2.0 mg and Fentanyl 25 mcg was administered intravenously. Moderate Sedation Time: 13 minutes. The patient's level of consciousness and vital signs were monitored continuously by radiology nursing throughout the procedure under my direct supervision. FLUOROSCOPY TIME:  Fluoroscopy Time: 1 minutes 48 seconds (21 mGy). COMPLICATIONS: None immediate. PROCEDURE: Informed written consent was obtained from the patient after a thorough discussion of the procedural risks, benefits and alternatives. All questions were addressed. Maximal Sterile Barrier Technique was utilized including caps, mask, sterile gowns, sterile gloves, sterile drape, hand hygiene and skin antiseptic. A timeout was performed prior to the initiation of the procedure. Under sterile conditions and local anesthesia, the existing biliary drain catheters were injected to confirm position. The right biliary drain has retracted into the common bile duct. Right biliary drain catheter was cut and removed. Catheter and guidewire were access were manipulated back to the duodenum. Over the Bentson guidewire, the right biliary drain catheter was successfully exchanged. Retention loop  formed in the duodenum. Contrast injection confirms position. In a similar fashion the left biliary drain was injected to confirm position. This catheter was cut and removed over a Bentson guidewire. New 10 French internal external biliary drain advanced with the retention loop formed in the duodenum. Catheters secured externally. No immediate complication. Patient tolerated the procedure well. IMPRESSION: Successful bilateral fluoroscopic exchange of the internal external biliary drains. Electronically Signed   By: Jerilynn Mages.  Shick M.D.   On: 01/06/2018 14:28    Labs:  CBC: Recent Labs    08/14/17 2047 08/15/17 0611 11/01/17 1709 11/02/17 1153  WBC 11.9* 8.4 11.3* 8.1  HGB 8.7* 8.3* 10.2* 9.7*  HCT 27.0* 24.4* 29.3* 28.6*  PLT 206 183 301 282    COAGS: Recent Labs    03/16/17 0913  04/30/17 0518 08/06/17 2329 11/01/17 1709  INR 0.96 0.91 1.09 1.30  APTT  --  43*  --  40*    BMP: Recent Labs    08/18/17 0603 11/01/17 1709 11/02/17 1153 11/03/17 0411 11/03/17 1054 11/03/17 1427  NA 132* 133* 136 138  --   --   K 3.7 2.4* 2.5* 2.3* 2.8* 4.6  CL 90* 90* 96* 100  --   --   CO2 37* 31 32 30  --   --   GLUCOSE 87 90 124* 108*  --   --   BUN 15 10 8 8   --   --   CALCIUM 8.1* 7.7* 7.5* 7.9*  --   --   CREATININE 0.66 0.98 0.67 0.68  --   --   GFRNONAA >60 55* >60 >60  --   --   GFRAA >60 >60 >60 >60  --   --     LIVER FUNCTION TESTS: Recent Labs    08/06/17 2329 08/12/17 1904 08/14/17 2047 11/01/17 1709  BILITOT 0.6 0.8 0.7 3.7*  AST 58* 55* 41 184*  ALT 29 23 18  72*  ALKPHOS 241* 329* 274* 393*  PROT 6.8 6.1* 6.2* 6.5  ALBUMIN 3.1* 2.7* 2.6* 2.7*    TUMOR MARKERS: No results for input(s): AFPTM, CEA, CA199, CHROMGRNA in the last 8760 hours.  Assessment and Plan:  Cholangiocarcinoma  with biliary drain malfunction. Exchange to follow.  Thank you for this interesting consult.  I greatly enjoyed meeting Regina Hood and look forward to participating in their care.   A copy of this report was sent to the requesting provider on this date.  Electronically Signed: Oryan Winterton, ART A, MD 01/10/2018, 2:23 PM   I spent a total of    15 Minutes in face to face in clinical consultation, greater than 50% of which was counseling/coordinating care for biliary drains.

## 2018-01-11 ENCOUNTER — Telehealth: Payer: Self-pay | Admitting: *Deleted

## 2018-01-11 NOTE — Telephone Encounter (Signed)
Patient stool sample got thrown out at [place] and patient refusing to recollect another stool sample. The Atropine order hinged on the CDiff results. Please advise

## 2018-01-12 NOTE — Telephone Encounter (Signed)
Ok to give Atropine for palliation. If she is able to give additional sample, please resend for C diff. Thanks.

## 2018-01-12 NOTE — Telephone Encounter (Signed)
Order called to [facility] per Dr Collie Siad response

## 2018-01-13 ENCOUNTER — Telehealth: Payer: Self-pay | Admitting: Oncology

## 2018-01-13 ENCOUNTER — Ambulatory Visit: Payer: PPO

## 2018-01-13 NOTE — Telephone Encounter (Signed)
Hospice RN Mitzi called and requests call back.  They don't have Atropine 0.69m dosage form but have 0.440mdosage form. Asks if ok to give 0.70m48mAdvise ok to change dose to 0.70mg60mbcutaneously.  Advise RN to clarify with pharmacist if the alternative dosing form she has is appropriate to be given subcutaneously. She voices understanding.

## 2018-01-15 ENCOUNTER — Other Ambulatory Visit: Payer: Self-pay | Admitting: Oncology

## 2018-01-15 MED ORDER — FENTANYL 25 MCG/HR TD PT72: 25.0000 ug | MEDICATED_PATCH | TRANSDERMAL | 0 refills | Status: AC

## 2018-01-15 NOTE — Progress Notes (Signed)
Hospice RN [facility] and reports that patient is out of Fentanyl patch. Informed [facility]

## 2018-01-18 ENCOUNTER — Telehealth: Payer: Self-pay

## 2018-01-18 NOTE — Telephone Encounter (Signed)
Regina Hood (social worker from hospice) returned call to respond to Dr. Tasia Catchings regarding pt being safe at home due to previous fall. Per Regina Hood, Pt is very alert and makes own decisions, she lives at home with her son Regina Hood "who is not a good cargiver." She has a nurse that visits her twice a week and attends to biliary tubes and other needs, home health aides 7 days a week and she (SW) is also involved. Regina Hood is working on getting her a Art therapist. She has talked to patient about going to a facility but patient refuses to go anywhere.

## 2018-02-01 ENCOUNTER — Other Ambulatory Visit: Payer: Self-pay | Admitting: *Deleted

## 2018-02-01 MED ORDER — MORPHINE SULFATE (CONCENTRATE) 10 MG /0.5 ML PO SOLN: 5.0000 mg | ORAL | 0 refills | Status: AC | PRN

## 2018-02-09 ENCOUNTER — Telehealth: Payer: Self-pay | Admitting: *Deleted

## 2018-02-09 NOTE — Telephone Encounter (Signed)
Hospice nurse called to report that patient expired this morning at 8:45

## 2018-02-17 DEATH — deceased

## 2019-10-09 IMAGING — CT CT ABD-PELV W/ CM
2 of 5 series · 16 of 46 positions shown, 18 images · IV contrast (APPLIED)
Comparison: None.

CLINICAL DATA: 76-year-old female with cholangiocarcinoma. Patient
reports that her PPD drain suture came off and has been having bile
leak along the surface of the abdomen to lower extremity. Patient
also felt right upper quadrant pain [REDACTED] now more diffuse.

EXAM:
CT ABDOMEN AND PELVIS WITH CONTRAST
TECHNIQUE: Multidetector CT imaging of the abdomen and pelvis was performed
using the standard protocol following bolus administration of
intravenous contrast.
CONTRAST:  100mL [id number] IOPAMIDOL ([id number]) INJECTION 61%

[Series 2: axial st · axial · 0.78mm/px · z∈[-461,-86]mm · 13 of 85 slices shown, 15 images]
[im 5/85  soft-tissue]
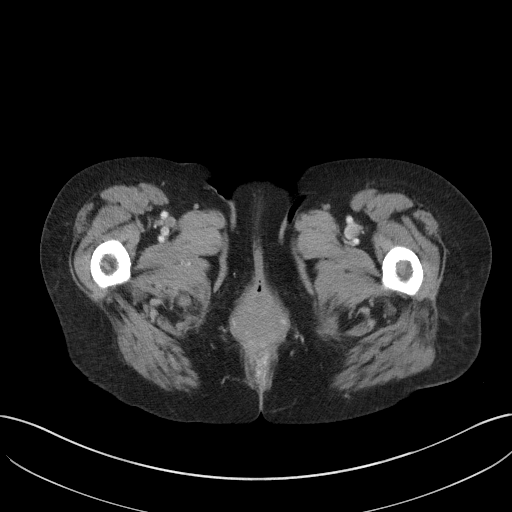
[im 5/85  bone]
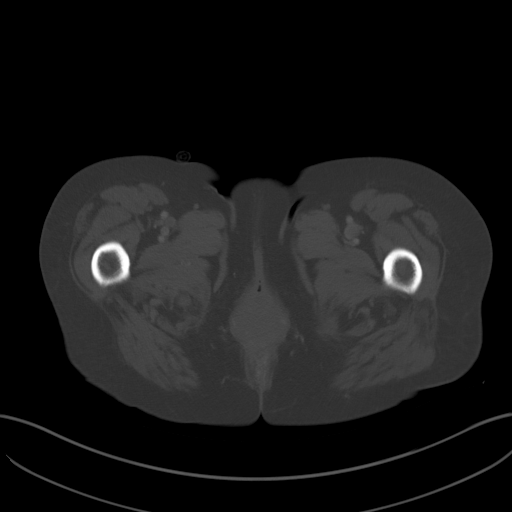
[im 10/85  soft-tissue]
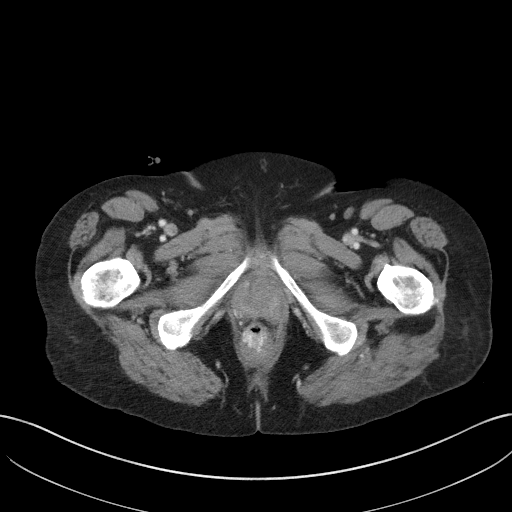
[im 19/85  soft-tissue]
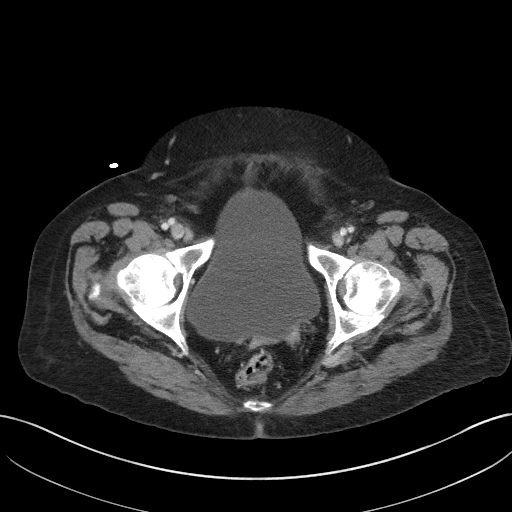
[im 24/85  soft-tissue]
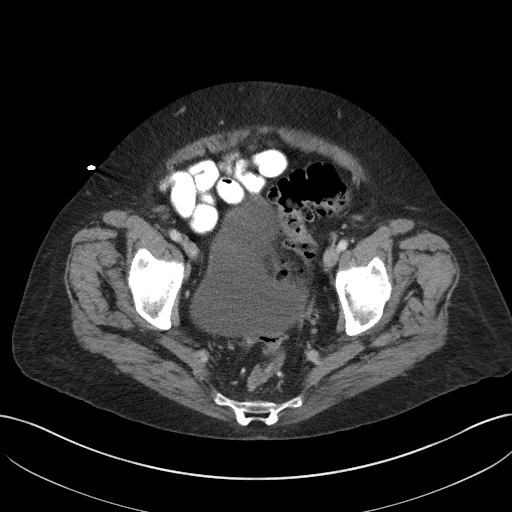
[im 29/85  soft-tissue]
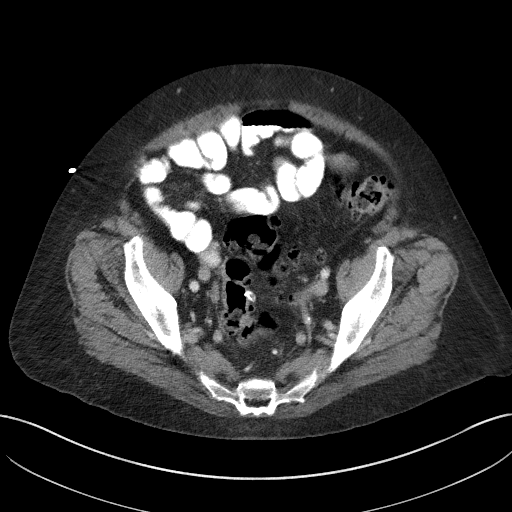
[im 38/85  soft-tissue]
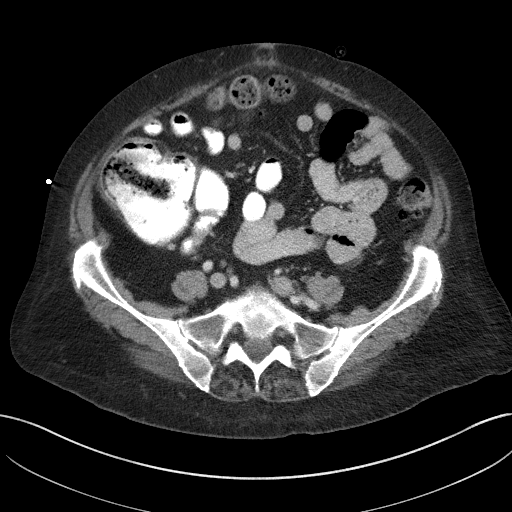
[im 43/85  soft-tissue]
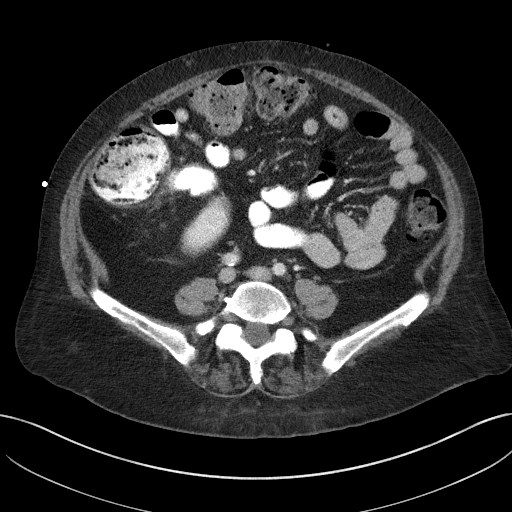
[im 47/85  soft-tissue]
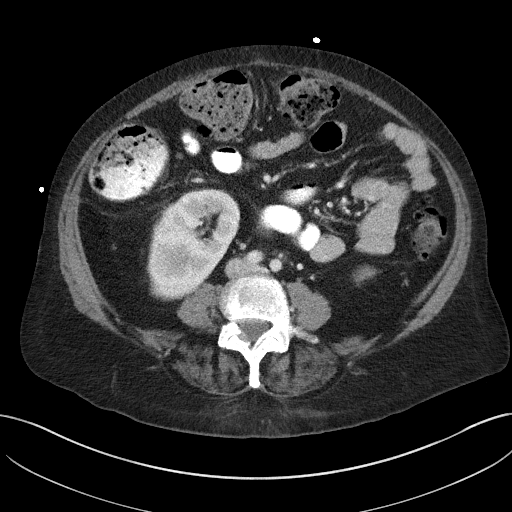
[im 57/85  soft-tissue]
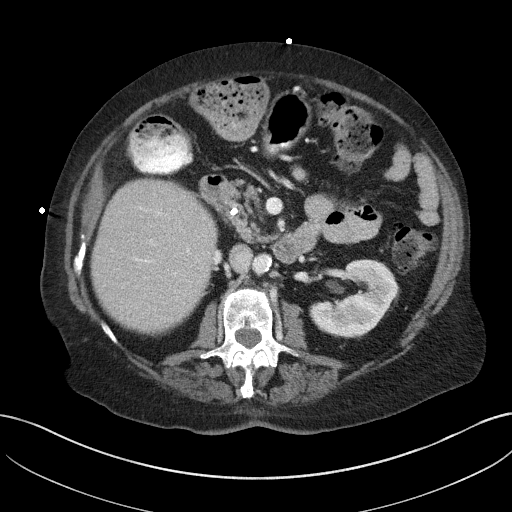
[im 57/85  bone]
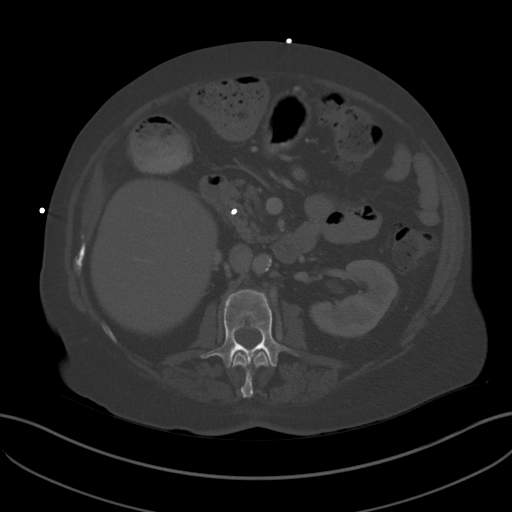
[im 61/85  soft-tissue]
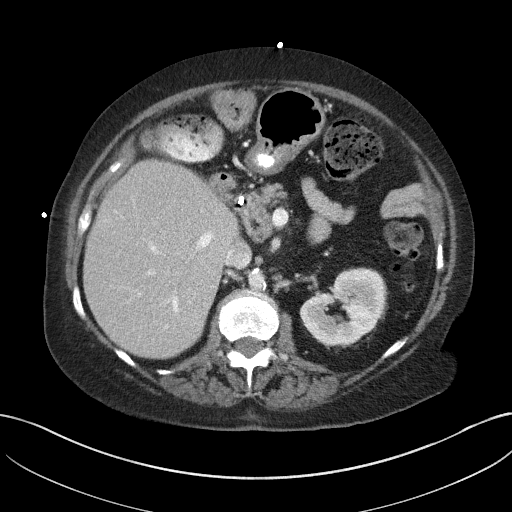
[im 66/85  soft-tissue]
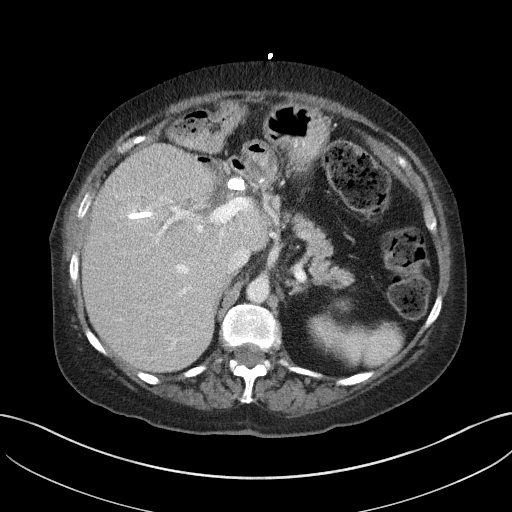
[im 75/85  soft-tissue]
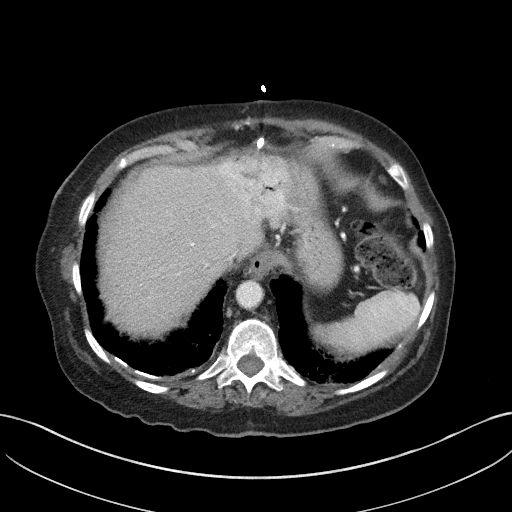
[im 80/85  soft-tissue]
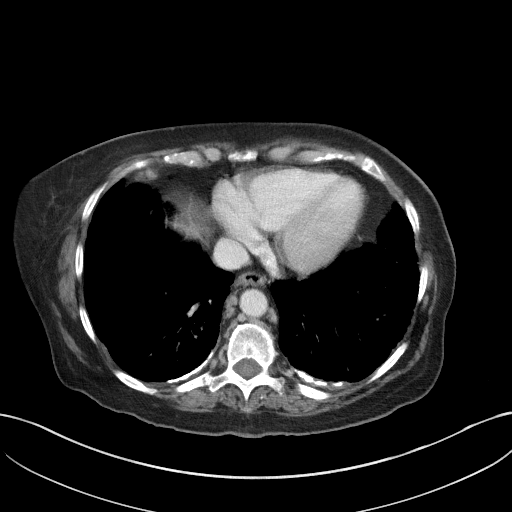

[Series 5: coronal st · coronal · 0.78mm/px · 3 of 106 slices shown]
[im 36/106  soft-tissue]
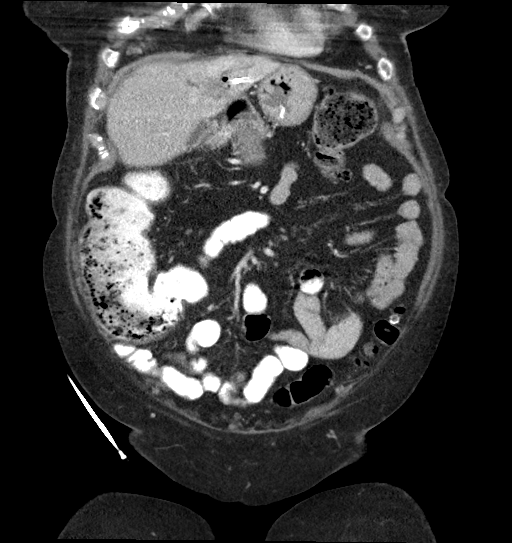
[im 47/106  soft-tissue]
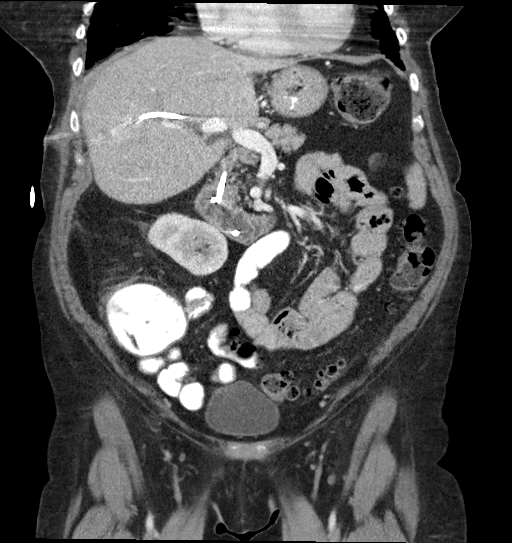
[im 59/106  soft-tissue]
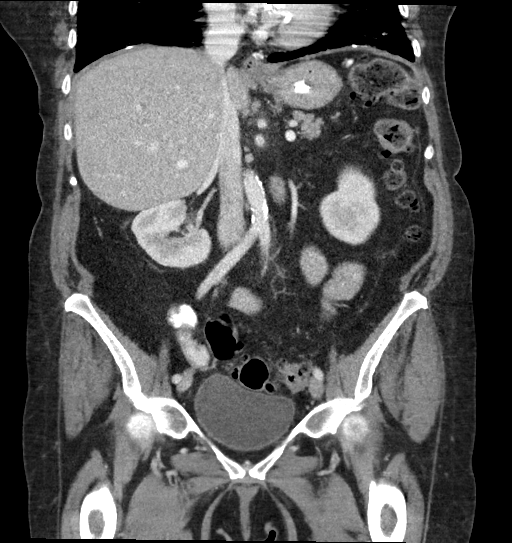

[16 of 46 positions shown; findings below may reference images not displayed]

FINDINGS: Lower chest: Normal heart size. Calcified pleural plaque
redemonstrated. Chronic stable interstitial change with
centrilobular and paraseptal emphysematous disease in the lower
lobes. No effusion or pneumothorax.

Hepatobiliary: Atrophic lateral segment of the left hepatic lobe
without focal abnormality in the right hepatic lobe. Two
percutaneous biliary drains are redemonstrated, one from a ventral
approach and the second from a right lateral approach. One of the
drains is noted with the tip in the duodenum as before. Previously
described hypoenhancing lesion in segment 3 of the liver is stable
measuring 2.4 x 1.8 cm versus 2.5 x 2.1 cm previously. Gallbladder
is decompressed with gas in the lumen compatible with presence of
biliary drains. No evidence of biloma.

Pancreas: Normal

Spleen: Normal

Adrenals/Urinary Tract: Adrenal glands are unremarkable. Kidneys are
normal, without renal calculi, focal lesion, or hydronephrosis.
Bladder is unremarkable.

Stomach/Bowel: Stomach is nondistended. No evidence of gastric
outlet obstruction. Normal ligament of Treitz position in small
bowel rotation. No small bowel thickening or inflammation. The
distal and terminal ileum are unremarkable. Normal appendix. A large
volume of retained stool is seen within the colon consistent
constipation. Descending and sigmoid diverticulosis without acute
diverticulitis.

Vascular/Lymphatic: Aortoiliac atherosclerosis without aneurysm. No
lymphadenopathy.

Reproductive: Hysterectomy. Small stable cyst in the left ovary
without suspicious features. This measures 1.6 cm in diameter.

Other: No intraperitoneal free fluid. No enhancing fluid collections
to suggest the presence of an abscess.

Musculoskeletal: No worrisome lytic or sclerotic lesions.
IMPRESSION: 1. Stable appearance of the percutaneous biliary drains without
evidence of bile leak or biloma.
2. Increased colonic stool burden consistent constipation. Colonic
diverticulosis along the left colon without acute diverticulitis.
3. Stable hypodensity in the left hepatic lobe segment 3 measuring
2.4 x 1.8 cm currently versus 2.5 x 2.1 cm previously.
4. Calcified pleural plaque consistent with prior asbestos exposure.
COPD.

## 2019-10-10 IMAGING — XA IR BILIARY DRAIN PLACEMENT W/ CHOLANGIOGRAM
3 series · 5 of 5 positions shown · non-contrast
Comparison: COMPARISON
CT abdomen pelvis - 06/07/2017;

INDICATION: History of cholangiocarcinoma with bilateral internal-external
biliary drainage catheters.
TECHNIQUE: Informed written consent was obtained from the patient after a
discussion of the risks, benefits and alternatives to treatment.
Questions regarding the procedure were encouraged and answered. A
timeout was performed prior to the initiation of the procedure.

[Series 1: fl - angio · 2 of 2 slices shown (1 of 3)]
[im 1/2]
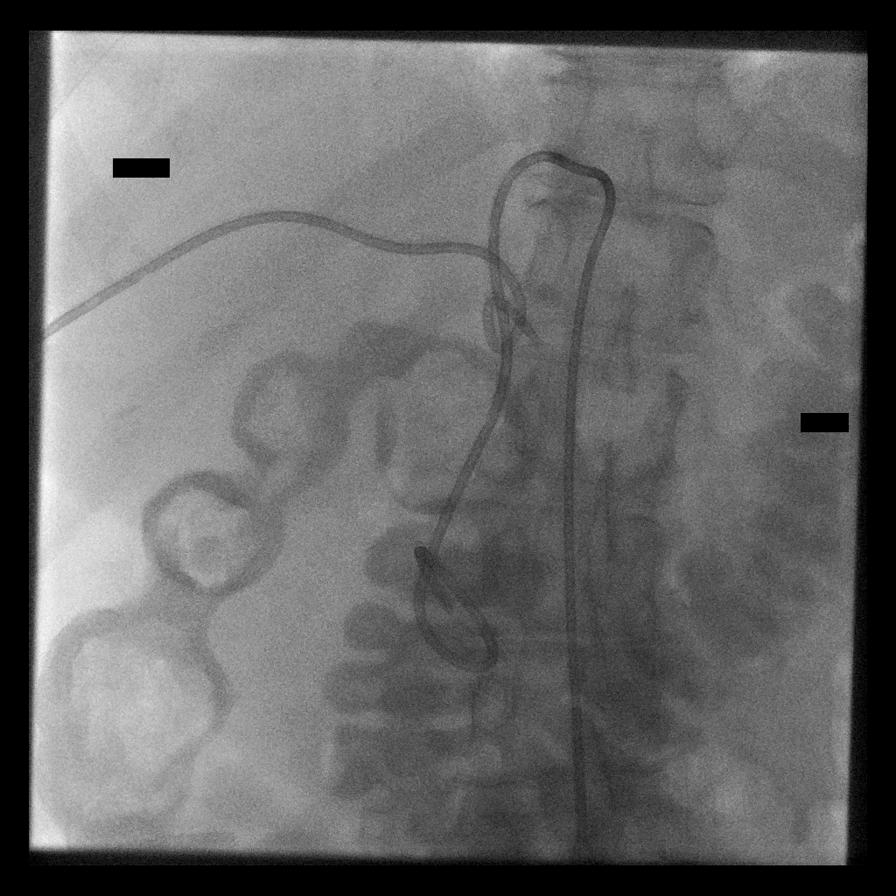
[im 2/2]
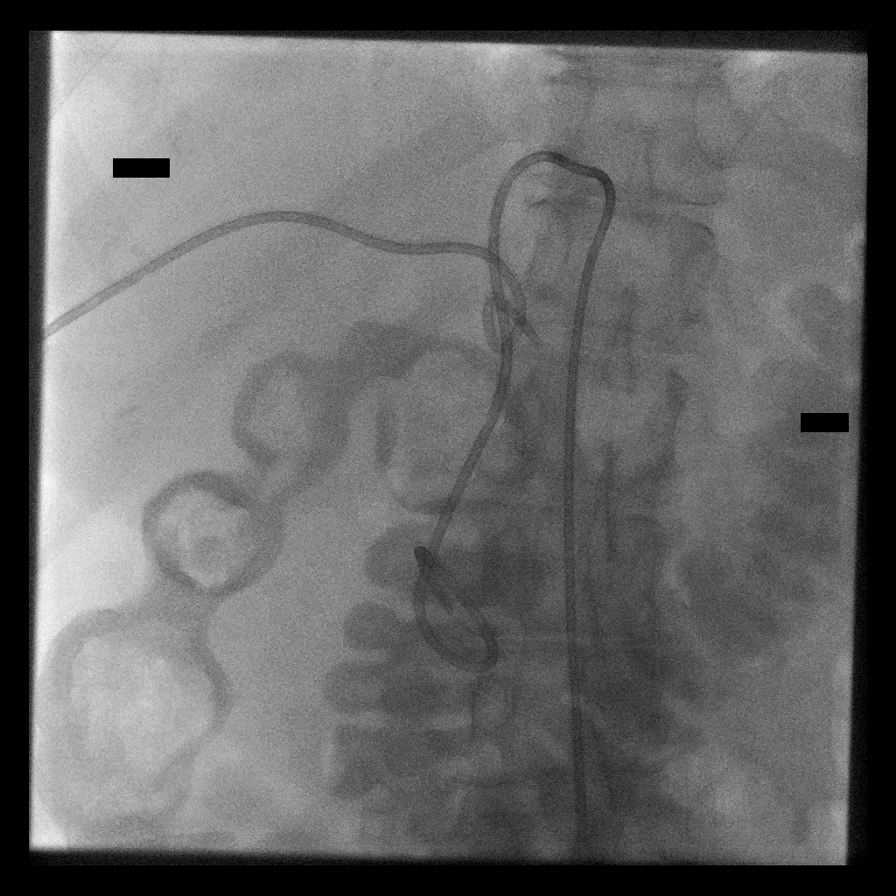

[Series 2: fl - angio · 1 of 1 slices shown (2 of 3)]
[im 1/1]
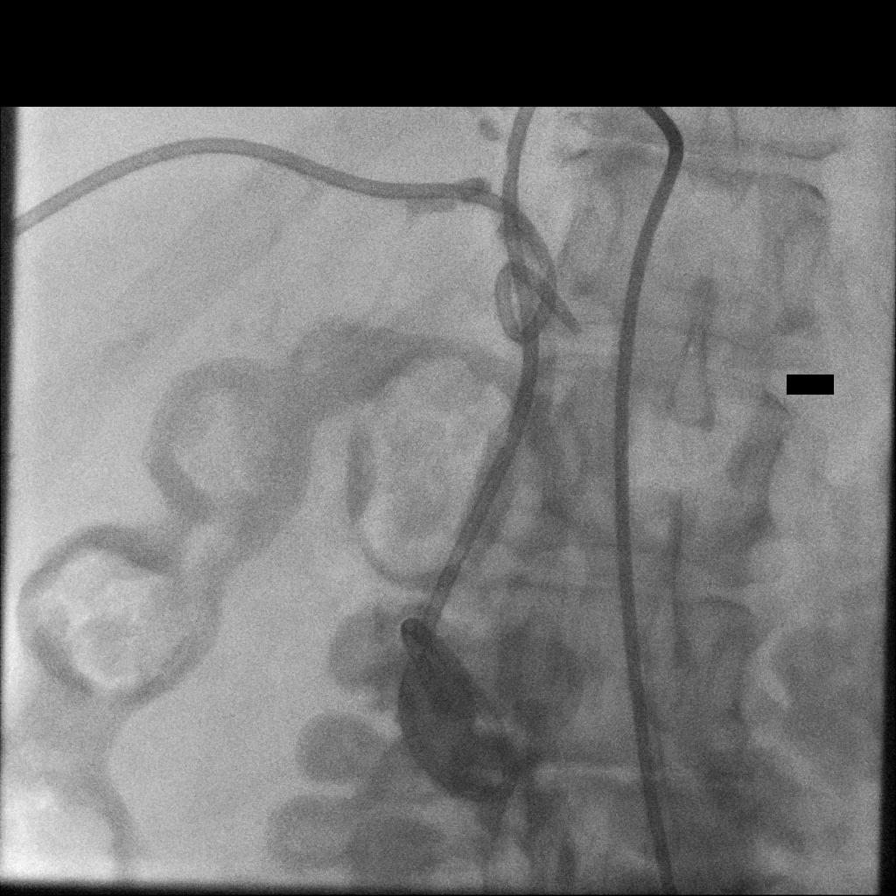

[Series 3: fl - angio · 2 of 2 slices shown (3 of 3)]
[im 1/2]
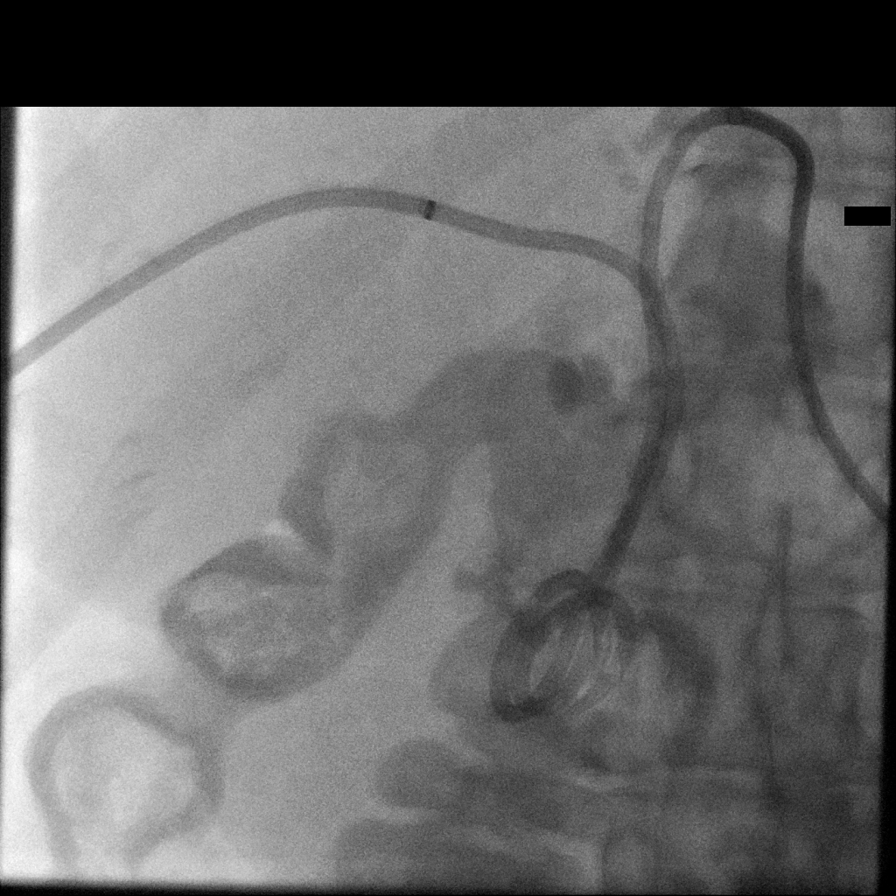
[im 2/2]
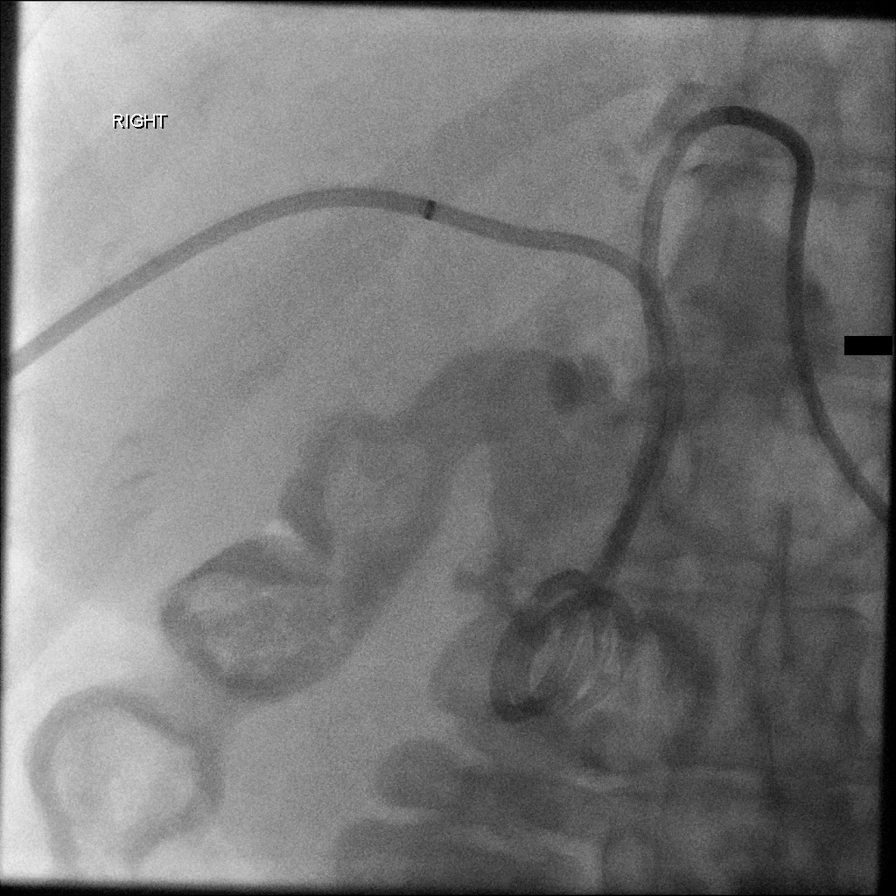

[5 of 5 positions shown; findings below may reference images not displayed]

Patient's right-sided percutaneous biliary drainage catheters been
interval early retracted and as such, request made for fluoroscopic
guided exchange.

Patient is due for routine fluoroscopic guided exchange and as such,
the bilateral percutaneous drainage catheters will be exchanged

Additionally, given the chronicity of the patient's bilateral
percutaneous drainage catheters, the drains will be upsized from
their existing 8 French to 10.2 French.

EXAM:
FLUOROSCOPIC GUIDED EXCHANGE OF BILATERAL PERCUTANEOUS BILIARY
DRAINAGE CATHETERS
04/29/2017

CONTRAST:  10 cc Isovue 300-administered into the biliary system

FLUOROSCOPY TIME:  2 minutes 57 seconds (117 mGy)

COMPLICATIONS:
None immediate.
The external portion of the existing biliary drainage catheters as
well as the surrounding skin were prepped and draped in the usual
sterile fashion. A sterile drape was applied covering the operative
field. Maximum barrier sterile technique with sterile gowns and
gloves were used for the procedure. A timeout was performed prior to
the initiation of the procedure.

Preprocedural spot fluoroscopic image was obtained of the right
upper abdominal quadrant existing bilateral percutaneous biliary
drainage catheters.

A small amount of contrast was injected via both drainage catheters.

Attention was first paid towards exchange and repositioning of the
malpositioned right-sided biliary drainage catheter.

The external portion of the biliary drainage catheter was cut and
cannulated with a stiff Amplatz wire which was advanced through the
drainage catheter to the level of the duodenum. Under intermittent
fluoroscopic guidance, the existing biliary drainage catheters
exchanged for a new, slightly larger, 10.2 French biliary drainage
catheter with tip ultimately coiled and locked within the duodenum.

Next, the identical procedure was repeated for left hepatic approach
biliary drainage catheter again with a new, slightly larger,
French biliary drainage catheter with tip ultimately coiled and
locked within the duodenum.

Small amount of contrast was injected confirming appropriate
position functionality of the new bilateral percutaneous drainage
catheters.

The catheters were secured to the skin with a single interrupted
suture and a StatLock device. Both drainage catheters were capped.
Dressings were placed. The patient tolerated the procedure well
without immediate postprocedural complication.
FINDINGS: Preprocedural spot fluoroscopic image demonstrates retraction of the
right hepatic approach biliary drainage catheter with tip coiled at
the level the biliary hilum.

After successful fluoroscopic guided exchange, repositioning and up
sizing, the right and left now 10.2 French percutaneous drainage
catheters are appropriately positioned with ends coiled and locked
within the duodenum.
IMPRESSION: Successful fluoroscopic guided exchange and up sizing of bilateral
now 10.2 French biliary drainage catheters.

PLAN:
- Both biliary drainage catheters were capped for a trial of
internalization.

- The patient was given 2 extra gravity bag and instructed to
connect the drainage catheter(s) to gravity bags if she were to
experience excessive leakage around one or both of the biliary
drainage catheters.

- The patient will otherwise return for routine fluoroscopic guided
exchange in approximately 8 weeks.
